# Patient Record
Sex: Male | Born: 1935 | Race: White | Hispanic: No | Marital: Married | Smoking: Current every day smoker
Health system: Southern US, Community
[De-identification: ages and names within clinical notes are randomized; demographics above are authoritative.]

## PROBLEM LIST (undated history)

## (undated) ENCOUNTER — Inpatient Hospital Stay: Admission: EM | Payer: Self-pay | Source: Home / Self Care

## (undated) DIAGNOSIS — C459 Mesothelioma, unspecified: Secondary | ICD-10-CM

## (undated) DIAGNOSIS — F419 Anxiety disorder, unspecified: Secondary | ICD-10-CM

## (undated) DIAGNOSIS — M7021 Olecranon bursitis, right elbow: Secondary | ICD-10-CM

## (undated) DIAGNOSIS — J189 Pneumonia, unspecified organism: Secondary | ICD-10-CM

## (undated) DIAGNOSIS — I82409 Acute embolism and thrombosis of unspecified deep veins of unspecified lower extremity: Secondary | ICD-10-CM

## (undated) DIAGNOSIS — I714 Abdominal aortic aneurysm, without rupture, unspecified: Secondary | ICD-10-CM

## (undated) DIAGNOSIS — F329 Major depressive disorder, single episode, unspecified: Secondary | ICD-10-CM

## (undated) DIAGNOSIS — N400 Enlarged prostate without lower urinary tract symptoms: Secondary | ICD-10-CM

## (undated) DIAGNOSIS — R238 Other skin changes: Secondary | ICD-10-CM

## (undated) DIAGNOSIS — J069 Acute upper respiratory infection, unspecified: Secondary | ICD-10-CM

## (undated) DIAGNOSIS — I10 Essential (primary) hypertension: Secondary | ICD-10-CM

## (undated) DIAGNOSIS — I4891 Unspecified atrial fibrillation: Secondary | ICD-10-CM

## (undated) DIAGNOSIS — K219 Gastro-esophageal reflux disease without esophagitis: Secondary | ICD-10-CM

## (undated) DIAGNOSIS — F32A Depression, unspecified: Secondary | ICD-10-CM

## (undated) DIAGNOSIS — R233 Spontaneous ecchymoses: Secondary | ICD-10-CM

## (undated) DIAGNOSIS — R040 Epistaxis: Secondary | ICD-10-CM

## (undated) DIAGNOSIS — J449 Chronic obstructive pulmonary disease, unspecified: Secondary | ICD-10-CM

## (undated) DIAGNOSIS — Z8719 Personal history of other diseases of the digestive system: Secondary | ICD-10-CM

## (undated) DIAGNOSIS — M1990 Unspecified osteoarthritis, unspecified site: Secondary | ICD-10-CM

## (undated) DIAGNOSIS — G4730 Sleep apnea, unspecified: Secondary | ICD-10-CM

## (undated) DIAGNOSIS — I499 Cardiac arrhythmia, unspecified: Secondary | ICD-10-CM

## (undated) HISTORY — DX: Gastro-esophageal reflux disease without esophagitis: K21.9

## (undated) HISTORY — DX: Abdominal aortic aneurysm, without rupture: I71.4

## (undated) HISTORY — DX: Unspecified atrial fibrillation: I48.91

## (undated) HISTORY — DX: Depression, unspecified: F32.A

## (undated) HISTORY — DX: Abdominal aortic aneurysm, without rupture, unspecified: I71.40

## (undated) HISTORY — DX: Olecranon bursitis, right elbow: M70.21

## (undated) HISTORY — DX: Major depressive disorder, single episode, unspecified: F32.9

## (undated) HISTORY — DX: Unspecified osteoarthritis, unspecified site: M19.90

## (undated) HISTORY — DX: Epistaxis: R04.0

## (undated) HISTORY — PX: HERNIA REPAIR: SHX51

## (undated) HISTORY — DX: Mesothelioma, unspecified: C45.9

---
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
## 2009-02-06 ENCOUNTER — Emergency Department (HOSPITAL_COMMUNITY): Admission: EM | Admit: 2009-02-06 | Discharge: 2009-02-06 | Payer: Self-pay | Admitting: Emergency Medicine

## 2010-03-28 LAB — POCT I-STAT, CHEM 8
BUN: 14 mg/dL (ref 6–23)
Calcium, Ion: 1.1 mmol/L — ABNORMAL LOW (ref 1.12–1.32)
Glucose, Bld: 101 mg/dL — ABNORMAL HIGH (ref 70–99)
Hemoglobin: 13.6 g/dL (ref 13.0–17.0)
TCO2: 24 mmol/L (ref 0–100)

## 2010-03-28 LAB — URINE MICROSCOPIC-ADD ON

## 2010-03-28 LAB — URINALYSIS, ROUTINE W REFLEX MICROSCOPIC
Bilirubin Urine: NEGATIVE
Nitrite: POSITIVE — AB
Protein, ur: NEGATIVE mg/dL
Specific Gravity, Urine: 1.021 (ref 1.005–1.030)
pH: 5.5 (ref 5.0–8.0)

## 2010-03-28 LAB — URINE CULTURE

## 2010-07-30 ENCOUNTER — Other Ambulatory Visit: Payer: Self-pay

## 2010-07-30 ENCOUNTER — Emergency Department (INDEPENDENT_AMBULATORY_CARE_PROVIDER_SITE_OTHER): Payer: Medicare PPO

## 2010-07-30 ENCOUNTER — Encounter: Payer: Self-pay | Admitting: *Deleted

## 2010-07-30 ENCOUNTER — Emergency Department (HOSPITAL_BASED_OUTPATIENT_CLINIC_OR_DEPARTMENT_OTHER)
Admission: EM | Admit: 2010-07-30 | Discharge: 2010-07-31 | Disposition: A | Discharge status: Other Acute Inpatient Hospital | Payer: Medicare PPO | Source: Home / Self Care | Attending: Emergency Medicine | Admitting: Emergency Medicine

## 2010-07-30 DIAGNOSIS — R0602 Shortness of breath: Secondary | ICD-10-CM

## 2010-07-30 DIAGNOSIS — I517 Cardiomegaly: Secondary | ICD-10-CM

## 2010-07-30 DIAGNOSIS — IMO0001 Reserved for inherently not codable concepts without codable children: Secondary | ICD-10-CM

## 2010-07-30 DIAGNOSIS — J90 Pleural effusion, not elsewhere classified: Secondary | ICD-10-CM

## 2010-07-30 HISTORY — DX: Benign prostatic hyperplasia without lower urinary tract symptoms: N40.0

## 2010-07-30 HISTORY — DX: Essential (primary) hypertension: I10

## 2010-07-30 LAB — CBC
HCT: 42.5 % (ref 39.0–52.0)
MCH: 31.7 pg (ref 26.0–34.0)
MCHC: 34.4 g/dL (ref 30.0–36.0)
Platelets: 132 10*3/uL — ABNORMAL LOW (ref 150–400)
RBC: 4.6 MIL/uL (ref 4.22–5.81)
RDW: 13.8 % (ref 11.5–15.5)

## 2010-07-30 LAB — BASIC METABOLIC PANEL
BUN: 24 mg/dL — ABNORMAL HIGH (ref 6–23)
CO2: 22 mEq/L (ref 19–32)
Creatinine, Ser: 1.1 mg/dL (ref 0.50–1.35)
Glucose, Bld: 181 mg/dL — ABNORMAL HIGH (ref 70–99)
Sodium: 137 mEq/L (ref 135–145)

## 2010-07-30 LAB — POCT I-STAT 3, ART BLOOD GAS (G3+)
Acid-base deficit: 2 mmol/L (ref 0.0–2.0)
O2 Saturation: 96 %
Patient temperature: 37
TCO2: 23 mmol/L (ref 0–100)
pCO2 arterial: 33.3 mmHg — ABNORMAL LOW (ref 35.0–45.0)

## 2010-07-30 LAB — DIFFERENTIAL
Basophils Relative: 0 % (ref 0–1)
Eosinophils Absolute: 0.2 10*3/uL (ref 0.0–0.7)
Lymphs Abs: 1.1 10*3/uL (ref 0.7–4.0)
Monocytes Relative: 6 % (ref 3–12)

## 2010-07-30 LAB — PROTIME-INR
INR: 0.98 (ref 0.00–1.49)
Prothrombin Time: 13.2 seconds (ref 11.6–15.2)

## 2010-07-30 LAB — PRO B NATRIURETIC PEPTIDE: Pro B Natriuretic peptide (BNP): 311.4 pg/mL (ref 0–450)

## 2010-07-30 LAB — APTT: aPTT: 31 seconds (ref 24–37)

## 2010-07-30 LAB — CARDIAC PANEL(CRET KIN+CKTOT+MB+TROPI): Relative Index: INVALID (ref 0.0–2.5)

## 2010-07-30 MED ORDER — ALBUTEROL SULFATE (5 MG/ML) 0.5% IN NEBU
5.0000 mg | INHALATION_SOLUTION | Freq: Once | RESPIRATORY_TRACT | Status: AC
  Administered 2010-07-30: 5 mg via RESPIRATORY_TRACT
  Filled 2010-07-30: qty 1

## 2010-07-30 NOTE — ED Provider Notes (Signed)
History     Chief Complaint  Patient presents with  . Shortness of Breath   HPI Comments: Patient presents with acute onset of shortness of breath which occurred approximately 4:30 this afternoon. He states that he had been outside working and came in to the house at which time he developed acute onset shortness of breath. This has been constant, moderate, worse with exertion. He has had no chest pain or back pain and has had no swelling of his legs today. He does however describe swelling of his legs that occurred last week. He was seen recently at his family care doctor where he was evaluated as a recheck and found to have hypertension and was started on Benicar. He denies any coughing, fevers, rashes, abdominal pain, blurry vision, dizziness, headache. She admits to smoking tobacco his wife but has never been diagnosed with COPD or other lung disease.  He noticed it is difficulty breathing resolved with oxygen by nasal cannula on arrival. However his initial oxygen saturations were 90% on room air.  Patient is a 77 y.o. male presenting with shortness of breath. The history is provided by the patient and the spouse.  Shortness of Breath  Associated symptoms include shortness of breath.    Past Medical History  Diagnosis Date  . Hypertension   . Prostate enlargement     Past Surgical History  Procedure Date  . Knee surgery     History reviewed. No pertinent family history.  History  Substance Use Topics  . Smoking status: Not on file  . Smokeless tobacco: Current User  . Alcohol Use: No      Review of Systems  Respiratory: Positive for shortness of breath.   All other systems reviewed and are negative.    Physical Exam  BP 182/72  Pulse 67  Temp(Src) 97.7 F (36.5 C) (Oral)  SpO2 96%  Physical Exam  Nursing note and vitals reviewed. Constitutional: He appears well-developed and well-nourished. No distress.  HENT:  Head: Normocephalic and atraumatic.    Mouth/Throat: Oropharynx is clear and moist. No oropharyngeal exudate.  Eyes: Conjunctivae and EOM are normal. Pupils are equal, round, and reactive to light. Right eye exhibits no discharge. Left eye exhibits no discharge. No scleral icterus.  Neck: Normal range of motion. Neck supple. No JVD present. No thyromegaly present.  Cardiovascular: Normal rate, regular rhythm, normal heart sounds and intact distal pulses.  Exam reveals no gallop and no friction rub.   No murmur heard. Pulmonary/Chest: Effort normal and breath sounds normal. No respiratory distress. He has no wheezes. He has no rales.       No focal findings in the lungs. No distress. No accessory muscle use. Oxygen noted to be 90% on room air, 97% on 2 L nasal cannula.  Abdominal: Soft. Bowel sounds are normal. He exhibits no distension and no mass. There is no tenderness.  Musculoskeletal: Normal range of motion. He exhibits no edema and no tenderness.  Lymphadenopathy:    He has no cervical adenopathy.  Neurological: He is alert. Coordination normal.  Skin: Skin is warm and dry. No rash noted. He is not diaphoretic. No erythema.  Psychiatric: He has a normal mood and affect. His behavior is normal.    ED Course  Procedures  MDM Patient has a hypoxia for an unknown reason. We'll evaluate with EKG, laboratory workup, chest x-ray. Nasal cannula given with improvement of oxygen status. ABG pending  ED ECG REPORT   Date: 07/30/2010   Rate: 67  Rhythm: normal sinus rhythm  QRS Axis: left  Intervals: PR prolonged  ST/T Wave abnormalities: normal  Conduction Disutrbances:first-degree A-V block  and nonspecific intraventricular conduction delay  Narrative Interpretation: LVH with repolarization abnormality, no acute ST changes  Old EKG Reviewed: none available   Results for orders placed during the hospital encounter of 07/30/10  CBC      Component Value Range   WBC 8.0  4.0 - 10.5 (K/uL)   RBC 4.60  4.22 - 5.81 (MIL/uL)    Hemoglobin 14.6  13.0 - 17.0 (g/dL)   HCT 16.1  09.6 - 04.5 (%)   MCV 92.4  78.0 - 100.0 (fL)   MCH 31.7  26.0 - 34.0 (pg)   MCHC 34.4  30.0 - 36.0 (g/dL)   RDW 40.9  81.1 - 91.4 (%)   Platelets 132 (*) 150 - 400 (K/uL)  DIFFERENTIAL      Component Value Range   Neutrophils Relative 79 (*) 43 - 77 (%)   Neutro Abs 6.3  1.7 - 7.7 (K/uL)   Lymphocytes Relative 13  12 - 46 (%)   Lymphs Abs 1.1  0.7 - 4.0 (K/uL)   Monocytes Relative 6  3 - 12 (%)   Monocytes Absolute 0.5  0.1 - 1.0 (K/uL)   Eosinophils Relative 2  0 - 5 (%)   Eosinophils Absolute 0.2  0.0 - 0.7 (K/uL)   Basophils Relative 0  0 - 1 (%)   Basophils Absolute 0.0  0.0 - 0.1 (K/uL)  BASIC METABOLIC PANEL      Component Value Range   Sodium 137  135 - 145 (mEq/L)   Potassium 3.7  3.5 - 5.1 (mEq/L)   Chloride 100  96 - 112 (mEq/L)   CO2 22  19 - 32 (mEq/L)   Glucose, Bld 181 (*) 70 - 99 (mg/dL)   BUN 24 (*) 6 - 23 (mg/dL)   Creatinine, Ser 7.82  0.50 - 1.35 (mg/dL)   Calcium 9.7  8.4 - 95.6 (mg/dL)   GFR calc non Af Amer >60  >60 (mL/min)   GFR calc Af Amer >60  >60 (mL/min)  APTT      Component Value Range   aPTT 31  24 - 37 (seconds)  PROTIME-INR      Component Value Range   Prothrombin Time 13.2  11.6 - 15.2 (seconds)   INR 0.98  0.00 - 1.49   PRO B NATRIURETIC PEPTIDE      Component Value Range   BNP, POC 311.4  0 - 450 (pg/mL)  CARDIAC PANEL(CRET KIN+CKTOT+MB+TROPI)      Component Value Range   Total CK 43  7 - 232 (U/L)   CK, MB 2.2  0.3 - 4.0 (ng/mL)   Troponin I <0.30  <0.30 (ng/mL)   Relative Index RELATIVE INDEX IS INVALID  0.0 - 2.5   POCT I-STAT 3, BLOOD GAS (G3+)      Component Value Range   pH, Arterial 7.423  7.350 - 7.450    pCO2 33.3 (*) 35.0 - 45.0 (mmHg)   pO2, Arterial 82.0  80.0 - 100.0 (mmHg)   Bicarbonate 21.8  20.0 - 24.0 (mEq/L)   TCO2 23  0 - 100 (mmol/L)   O2 Saturation 96.0     Acid-base deficit 2.0  0.0 - 2.0 (mmol/L)   Patient temperature 37.0 C     Collection site  RADIAL, ALLEN'S TEST ACCEPTABLE     Drawn by RT     Sample type ARTERIAL  Dg Chest 2 View  07/30/2010  *RADIOLOGY REPORT*  Clinical Data: Short of breath  CHEST - 2 VIEW  Comparison: Chest radiograph 02/06/2009  Findings: Normal mediastinum and cardiac silhouette with ectatic aorta.  The small bilateral pleural effusions are similar to prior. There is mild interstitial prominence which is similar to prior. No focal consolidation.  IMPRESSION:   Small bilateral pleural effusions and likely mild interstitial edema.  No significant change from prior.  Original Report Authenticated By: Genevive Bi, M.D.   I have reevaluated pt and has slight wheezing, sat's 96% on ra on my exam at this time.  He has no sx during his stay in the ED.  Neb ordered. Labs and imaging reviewed.  I have ambulated patient on room air and his oxygen saturation has dropped to close to 90%. He feels dyspneic at rest and despite getting albuterol has not had significant improvement in his subjective shortness of breath. ABG sample showed hypoxia with an O2 of 82 on 3 L nasal cannula. Will order CT scan to rule out PE prior to disposition.  I have reviewed CT scan.  Paged [facility] for admission for hypoxia and dyspnea. 11:51 PM  Discussed with [facility] hospitalist who has accepted in transfer.  Vida Roller, MD 07/31/10 [id number]

## 2010-07-30 NOTE — ED Notes (Signed)
Per patient & wife, started having sob around 16:30, took two tramadol this morning for R hip pain that shots down R leg

## 2010-07-30 NOTE — Procedures (Signed)
Steve [name] performed arterial puncture for collection of an ABG. Steve [name] RRT not able to sign off on procedure due to not added into Epic system. + Allens test/ pt tolerated well

## 2010-07-31 ENCOUNTER — Encounter (HOSPITAL_BASED_OUTPATIENT_CLINIC_OR_DEPARTMENT_OTHER): Payer: Self-pay | Admitting: *Deleted

## 2010-07-31 ENCOUNTER — Inpatient Hospital Stay (HOSPITAL_COMMUNITY)
Admission: AD | Admit: 2010-07-31 | Discharge: 2010-08-02 | DRG: 192 | Disposition: A | Discharge status: Home / Self Care | Payer: Medicare PPO | Source: Other Acute Inpatient Hospital | Attending: Internal Medicine | Admitting: Internal Medicine

## 2010-07-31 DIAGNOSIS — R0602 Shortness of breath: Secondary | ICD-10-CM

## 2010-07-31 DIAGNOSIS — I10 Essential (primary) hypertension: Secondary | ICD-10-CM | POA: Diagnosis present

## 2010-07-31 DIAGNOSIS — F17200 Nicotine dependence, unspecified, uncomplicated: Secondary | ICD-10-CM | POA: Diagnosis present

## 2010-07-31 DIAGNOSIS — Z79899 Other long term (current) drug therapy: Secondary | ICD-10-CM

## 2010-07-31 DIAGNOSIS — J209 Acute bronchitis, unspecified: Principal | ICD-10-CM | POA: Diagnosis present

## 2010-07-31 DIAGNOSIS — D72829 Elevated white blood cell count, unspecified: Secondary | ICD-10-CM | POA: Diagnosis present

## 2010-07-31 DIAGNOSIS — T380X5A Adverse effect of glucocorticoids and synthetic analogues, initial encounter: Secondary | ICD-10-CM | POA: Diagnosis present

## 2010-07-31 DIAGNOSIS — N400 Enlarged prostate without lower urinary tract symptoms: Secondary | ICD-10-CM | POA: Diagnosis present

## 2010-07-31 DIAGNOSIS — R0902 Hypoxemia: Secondary | ICD-10-CM | POA: Diagnosis present

## 2010-07-31 DIAGNOSIS — J440 Chronic obstructive pulmonary disease with acute lower respiratory infection: Principal | ICD-10-CM | POA: Diagnosis present

## 2010-07-31 DIAGNOSIS — J90 Pleural effusion, not elsewhere classified: Secondary | ICD-10-CM

## 2010-07-31 DIAGNOSIS — J984 Other disorders of lung: Secondary | ICD-10-CM

## 2010-07-31 DIAGNOSIS — J438 Other emphysema: Secondary | ICD-10-CM

## 2010-07-31 DIAGNOSIS — IMO0002 Reserved for concepts with insufficient information to code with codable children: Secondary | ICD-10-CM

## 2010-07-31 LAB — CK TOTAL AND CKMB (NOT AT ARMC)
CK, MB: 3.3 ng/mL (ref 0.3–4.0)
Relative Index: INVALID (ref 0.0–2.5)
Total CK: 77 U/L (ref 7–232)
Total CK: 100 U/L (ref 7–232)

## 2010-07-31 LAB — BASIC METABOLIC PANEL
GFR calc Af Amer: >60 mL/min (ref >60)
GFR calc non Af Amer: >60 mL/min (ref >60)
Glucose, Bld: 195 mg/dL — ABNORMAL HIGH (ref 70–99)
Potassium: 3.9 mEq/L (ref 3.5–5.1)
Sodium: 136 mEq/L (ref 135–145)

## 2010-07-31 MED ORDER — METHYLPREDNISOLONE SODIUM SUCC 125 MG IJ SOLR
125.0000 mg | Freq: Once | INTRAMUSCULAR | Status: AC
  Administered 2010-07-31: 125 mg via INTRAVENOUS
  Filled 2010-07-31: qty 2

## 2010-07-31 MED ORDER — IOHEXOL 350 MG/ML SOLN
100.0000 mL | Freq: Once | INTRAVENOUS | Status: AC | PRN
  Administered 2010-07-31: 100 mL via INTRAVENOUS

## 2010-07-31 MED ORDER — ONDANSETRON HCL 4 MG/2ML IJ SOLN
INTRAMUSCULAR | Status: AC
  Administered 2010-07-31: 4 mg via INTRAVENOUS
  Filled 2010-07-31: qty 2

## 2010-07-31 NOTE — ED Notes (Signed)
[id number] called in order to give nurse report, was told that nurse was off the fllor and she would call back for report.

## 2010-08-01 ENCOUNTER — Inpatient Hospital Stay (HOSPITAL_COMMUNITY): Payer: Medicare PPO

## 2010-08-01 DIAGNOSIS — R0602 Shortness of breath: Secondary | ICD-10-CM

## 2010-08-01 LAB — CBC
HCT: 40.5 % (ref 39.0–52.0)
Hemoglobin: 14 g/dL (ref 13.0–17.0)
MCV: 92.9 fL (ref 78.0–100.0)
RBC: 4.36 MIL/uL (ref 4.22–5.81)
WBC: 16.3 10*3/uL — ABNORMAL HIGH (ref 4.0–10.5)

## 2010-08-01 LAB — COMPREHENSIVE METABOLIC PANEL
ALT: 13 U/L (ref 0–53)
Alkaline Phosphatase: 82 U/L (ref 39–117)
CO2: 25 mEq/L (ref 19–32)
Chloride: 102 mEq/L (ref 96–112)
GFR calc Af Amer: >60 mL/min (ref >60)
GFR calc non Af Amer: >60 mL/min (ref >60)
Glucose, Bld: 142 mg/dL — ABNORMAL HIGH (ref 70–99)
Potassium: 4.3 mEq/L (ref 3.5–5.1)
Sodium: 138 mEq/L (ref 135–145)

## 2010-08-03 ENCOUNTER — Inpatient Hospital Stay (HOSPITAL_COMMUNITY)
Admission: EM | Admit: 2010-08-03 | Discharge: 2010-08-06 | Disposition: A | Discharge status: Home / Self Care | Payer: Medicare PPO | Source: Ambulatory Visit

## 2010-08-03 ENCOUNTER — Emergency Department (HOSPITAL_COMMUNITY): Payer: Medicare PPO

## 2010-08-03 LAB — CBC
HCT: 42.9 % (ref 39.0–52.0)
MCH: 33 pg (ref 26.0–34.0)
MCV: 91.3 fL (ref 78.0–100.0)
Platelets: 152 10*3/uL (ref 150–400)
RBC: 4.7 MIL/uL (ref 4.22–5.81)
RDW: 13.4 % (ref 11.5–15.5)

## 2010-08-03 LAB — TROPONIN I: Troponin I: <0.3 ng/mL (ref <0.30)

## 2010-08-04 ENCOUNTER — Encounter: Payer: Self-pay | Admitting: Internal Medicine

## 2010-08-04 LAB — BASIC METABOLIC PANEL
BUN: 27 mg/dL — ABNORMAL HIGH (ref 6–23)
Chloride: 103 mEq/L (ref 96–112)
Creatinine, Ser: 1.01 mg/dL (ref 0.50–1.35)
GFR calc Af Amer: >60 mL/min (ref >60)
Glucose, Bld: 109 mg/dL — ABNORMAL HIGH (ref 70–99)

## 2010-08-04 LAB — CARDIAC PANEL(CRET KIN+CKTOT+MB+TROPI)
CK, MB: 1.6 ng/mL (ref 0.3–4.0)
Relative Index: INVALID (ref 0.0–2.5)
Troponin I: <0.3 ng/mL (ref <0.30)
Troponin I: <0.3 ng/mL (ref <0.30)

## 2010-08-05 LAB — CBC
HCT: 45 % (ref 39.0–52.0)
Hemoglobin: 15.9 g/dL (ref 13.0–17.0)
MCH: 32.1 pg (ref 26.0–34.0)
MCHC: 35.3 g/dL (ref 30.0–36.0)
MCV: 90.7 fL (ref 78.0–100.0)
RDW: 13.4 % (ref 11.5–15.5)

## 2010-08-05 LAB — DIFFERENTIAL
Basophils Absolute: 0 10*3/uL (ref 0.0–0.1)
Eosinophils Relative: 0 % (ref 0–5)
Lymphocytes Relative: 9 % — ABNORMAL LOW (ref 12–46)
Lymphs Abs: 1 10*3/uL (ref 0.7–4.0)
Monocytes Absolute: 0.3 10*3/uL (ref 0.1–1.0)
Monocytes Relative: 3 % (ref 3–12)

## 2010-08-05 LAB — VITAMIN B12: Vitamin B-12: 369 pg/mL (ref 211–911)

## 2010-08-05 LAB — BASIC METABOLIC PANEL
BUN: 29 mg/dL — ABNORMAL HIGH (ref 6–23)
CO2: 23 mEq/L (ref 19–32)
Calcium: 9.4 mg/dL (ref 8.4–10.5)
Creatinine, Ser: 1 mg/dL (ref 0.50–1.35)
GFR calc non Af Amer: >60 mL/min (ref >60)
Glucose, Bld: 142 mg/dL — ABNORMAL HIGH (ref 70–99)

## 2010-08-05 LAB — HEMOGLOBIN A1C
Hgb A1c MFr Bld: 5.9 % — ABNORMAL HIGH (ref <5.7)
Mean Plasma Glucose: 123 mg/dL — ABNORMAL HIGH (ref <117)

## 2010-08-05 LAB — FERRITIN: Ferritin: 580 ng/mL — ABNORMAL HIGH (ref 22–322)

## 2010-08-06 LAB — CBC
HCT: 42.9 % (ref 39.0–52.0)
MCHC: 34.7 g/dL (ref 30.0–36.0)
Platelets: 177 10*3/uL (ref 150–400)
RDW: 13.6 % (ref 11.5–15.5)

## 2010-08-06 LAB — BASIC METABOLIC PANEL
BUN: 39 mg/dL — ABNORMAL HIGH (ref 6–23)
GFR calc Af Amer: >60 mL/min (ref >60)
GFR calc non Af Amer: >60 mL/min (ref >60)
Potassium: 4.1 mEq/L (ref 3.5–5.1)
Sodium: 136 mEq/L (ref 135–145)

## 2010-08-08 LAB — FOLATE RBC: RBC Folate: 449 ng/mL (ref >=366)

## 2010-08-10 NOTE — Discharge Summary (Signed)
NAMEMarland Kitchen  Steve [name], Steve [name] NO.:  [id number]  MEDICAL RECORD NO.:  [id number]  LOCATION:  4713                         FACILITY:  [facility]  PHYSICIAN:  Calvert Cantor, M.D.     DATE OF BIRTH:  1933/04/17  DATE OF ADMISSION:  07/31/2010 DATE OF DISCHARGE:                              DISCHARGE SUMMARY   PRIMARY CARE PHYSICIAN:  None.  The patient states that he is trying to find one at [facility].  PRESENTING COMPLAINT:  Dyspnea on exertion.  DISCHARGE DIAGNOSES: 1. Dyspnea on exertion, most likely acute chronic obstructive     pulmonary disease exacerbation plus acute bronchitis. 2. Hypoxia secondary to above. 3. Nicotine abuse. 4. Hypertension. 5. Benign prostatic hypertrophy.  DISCHARGE MEDICATIONS.:  NEW MEDICATIONS: 1. Albuterol inhaler 2 puffs q.4 hours as needed for dyspnea on     exertion and wheezing. 2. Moxifloxacin 400 mg daily. 3. Nicotine patch.  He will take 21 mg patch for a week followed by 14     mg patch for a week followed by a 7 mg patch for a week. 4. Prednisone 40 mg daily for 3 more days.  Continue the following home meds: 1. Benicar 20 mg daily. 2. Flomax 0.4 mg daily.  PROCEDURES:  Lower extremity venous Doppler was negative for DVT, superficial thrombosis and Baker cyst bilaterally.  Chest x-ray two-view on July 30, 2010, revealed small bilateral pleural effusions and likely mild interstitial edema.  No significant change from prior.  CT angio of the chest revealed no PE.  There was small right-sided pleural effusion with associated consolidation likely compressive atelectasis.  Emphysematous changes.  Mild left apical ground glass opacification, nonspecific.  A 9-mm nodule in the left lower lung measuring water attenuation may represent loculated fluid or a congenital cyst.  A 2-D echo performed on July 31, 2010, revealed mild concentric hypertrophy of the left ventricle, EF was 65-70%, grade 1 diastolic dysfunction was noted.   Although, no diagnostic regional wall motion abnormality was identified.  This possibility cannot be completely excluded on the basis of the study.  Left atrium was noted to be mildly dilated.  The ascending aorta was mildly dilated.  Pulmonary function test performed on day 2 of hospital stay.  Conclusion states that they were within normal limits, but also mentioned that there is a possibility of restriction.  PERTINENT BLOOD WORK:  Three sets of cardiac enzymes were negative.  BNP on admission was 314 which is within normal limits.  HOSPITAL COURSE:  This is a 77 year old male who smoked heavily throughout his life who was admitted with a complaint of dyspnea on exertion.  According to the H and P, it started quite suddenly when he was walking around the house.  The patient was admitted and had above workup done, despite normal PFTs I do suspect that this episode that he is having is related to emphysema.  He does have mild to moderate cough with mucus.  I have noted rhonchi and wheezing on his exam.  He was hypoxic on admission and required 3 liters of oxygen to maintain a saturation of 91%.  Following day, he continued to be hypoxic with O2 sats in the  mid 90s on 3 liters at rest, with minimal activity his sats were dropping to the low 80s.  Today, his oxygen levels are much better, O2 sat is 93% at rest and 91- 92 after exertion.  The patient states that his breathing has returned to baseline.  He is agreeable to discontinue smoking.  PHYSICAL EXAM:  LUNGS:  Mild rhonchi still present bilaterally.  No obvious wheezing.  He has a good inspiratory effort. HEART:  Regular rate and rhythm.  No murmurs. ABDOMEN:  Soft, nontender, nondistended.  Bowel sounds positive. EXTREMITIES:  No cyanosis, clubbing or edema.  CONDITION ON DISCHARGE:  Stable.  FOLLOWUP INSTRUCTIONS:  He is advised to find a PCP and follow up in 1-2 weeks.  End of discharge summary time on patient care was  55 minutes.     Calvert Cantor, M.D.     SR/MEDQ  D:  08/02/2010  T:  08/02/2010  Job:  [id number]  Electronically Signed by Calvert Cantor M.D. on 08/10/2010 07:26:17 AM

## 2010-08-11 ENCOUNTER — Encounter: Payer: Self-pay | Admitting: Internal Medicine

## 2010-08-11 ENCOUNTER — Ambulatory Visit (HOSPITAL_BASED_OUTPATIENT_CLINIC_OR_DEPARTMENT_OTHER)
Admission: RE | Admit: 2010-08-11 | Discharge: 2010-08-11 | Disposition: A | Discharge status: Home / Self Care | Payer: Medicare PPO | Source: Ambulatory Visit | Attending: Internal Medicine | Admitting: Internal Medicine

## 2010-08-11 ENCOUNTER — Ambulatory Visit (INDEPENDENT_AMBULATORY_CARE_PROVIDER_SITE_OTHER): Payer: Medicare PPO | Admitting: Internal Medicine

## 2010-08-11 DIAGNOSIS — M949 Disorder of cartilage, unspecified: Secondary | ICD-10-CM

## 2010-08-11 DIAGNOSIS — M5416 Radiculopathy, lumbar region: Secondary | ICD-10-CM

## 2010-08-11 DIAGNOSIS — M899 Disorder of bone, unspecified: Secondary | ICD-10-CM | POA: Insufficient documentation

## 2010-08-11 DIAGNOSIS — M545 Low back pain: Secondary | ICD-10-CM

## 2010-08-11 DIAGNOSIS — M549 Dorsalgia, unspecified: Secondary | ICD-10-CM

## 2010-08-11 DIAGNOSIS — M47817 Spondylosis without myelopathy or radiculopathy, lumbosacral region: Secondary | ICD-10-CM | POA: Insufficient documentation

## 2010-08-11 DIAGNOSIS — IMO0002 Reserved for concepts with insufficient information to code with codable children: Secondary | ICD-10-CM

## 2010-08-11 DIAGNOSIS — R42 Dizziness and giddiness: Secondary | ICD-10-CM

## 2010-08-11 DIAGNOSIS — F329 Major depressive disorder, single episode, unspecified: Secondary | ICD-10-CM

## 2010-08-11 DIAGNOSIS — J449 Chronic obstructive pulmonary disease, unspecified: Secondary | ICD-10-CM

## 2010-08-11 LAB — BASIC METABOLIC PANEL
BUN: 32 mg/dL — ABNORMAL HIGH (ref 6–23)
Potassium: 4.4 mEq/L (ref 3.5–5.3)

## 2010-08-11 MED ORDER — PREDNISONE 10 MG PO TABS: ORAL_TABLET | ORAL | Status: DC

## 2010-08-11 MED ORDER — CITALOPRAM HYDROBROMIDE 20 MG PO TABS: 20.0000 mg | ORAL_TABLET | Freq: Every day | ORAL | Status: DC

## 2010-08-11 NOTE — H&P (Signed)
NAMEMarland Kitchen  NAI, DASCH NO.:  [id number]  MEDICAL RECORD NO.:  [id number]  LOCATION:  4713                         FACILITY:  [facility]  PHYSICIAN:  Kela Millin, M.D.DATE OF BIRTH:  09-16-1933  DATE OF ADMISSION:  07/31/2010 DATE OF DISCHARGE:                             HISTORY & PHYSICAL   CHIEF COMPLAINT:  Dyspnea on exertion.  HISTORY OF PRESENT ILLNESS:  The patient is a 77 year old white male with past medical history significant for tobacco abuse, hypertension, and BPH who presents with above complaints.  He states that he was in his usual state of health until about 4:30 p.m. on the evening of admission when he developed shortness of breath as he was walking into his house.  He denies leg swelling, but states that he had been taking some "fluid pills."  He denies chest pain.  No orthopnea or PND reported.  He was seen at the [facility] ED and a chest x-ray was done which revealed small bilateral pleural effusions and likely mild interstitial edema, unchanged from his prior chest x-ray in 2011, a CT angiogram was done and came back negative for pulmonary embolism.  The patient denies cough, fevers, dysuria, diarrhea, or melena and no hematochezia.  He is admitted for further evaluation and management.  PAST MEDICAL HISTORY:  As above.  MEDICATIONS: 1. Benicar 5 mg p.o. daily. 2. Tramadol 50 mg p.o. daily. 3. Ibuprofen 200 mg. 4. Flomax 0.4 mg daily.  ALLERGIES:  NKDA.  SOCIAL HISTORY:  Positive for tobacco 2 packs per day for about 60 years.  He states that he quit alcohol 27 years ago.  FAMILY HISTORY:  Reviewed and noncontributory to current illness.  REVIEW OF SYSTEMS:  As per HPI, other review of systems negative.  PHYSICAL EXAMINATION:  GENERAL:  The patient is an elderly white male, in no apparent respiratory distress on nasal cannula oxygen. VITAL SIGNS:  His temperature is 97.9 with a pulse of 67, blood pressure 172/85, O2 sat of  91% on 3 liters nasal cannula (O2 sats were 89-90% on room air at the [facility] ED). HEENT:  PERRL, EOMI, sclerae anicteric, moist mucous membranes, and no oral exudates. NECK:  Supple, no adenopathy, no thyromegaly, and no JVD. LUNGS:  Expiratory crackles bilaterally. CARDIOVASCULAR:  Regular rate and rhythm.  Normal S1 and S2.  No S3 appreciated. ABDOMEN:  Soft, bowel sounds present, nontender, nondistended.  No organomegaly and no masses palpable. EXTREMITIES:  No cyanosis and no edema. NEURO:  He is alert and oriented x3.  Cranial nerves II-XII grossly intact.  Nonfocal exam.  LABORATORY DATA:  The chest x-ray is as per HPI.  CT angiogram of the chest shows no pulmonary embolism.  Small right-sided pleural effusion with associated consolidation, likely compressive atelectasis. Emphysematous changes.  Mild left apical ground-glass opacification nonspecific.  9-mm nodule within the left lower lung, may just water attenuation and may represent loculated fluid or congenital cyst.  ASSESSMENT AND PLAN: 1. Dyspnea on exertion - probably secondary to chronic obstructive     pulmonary disease, CT angiogram negative for PE.  We will obtain     pulmonary function tests, cardiac enzymes and follow.  His brain     natriuretic peptide is unremarkable but he states that he has been     taking "fluid pills," although it is not on his list of medications     available at this time - we will obtain a 2-D echocardiogram and     follow.  We will place the patient on nebulized bronchodilators,     steroids, and supplemental oxygen, we will hold off antibiotics for     now. 2. Hypertension - we will continue Benicar. 3. Benign prostatic hypertrophy - continue Flomax. 4. Tobacco abuse - the patient is counseled to quit tobacco.     Kela Millin, M.D.     ACV/MEDQ  D:  07/31/2010  T:  07/31/2010  Job:  [id number]  Electronically Signed by Donnalee Curry M.D. on 08/11/2010 09:10:32 PM

## 2010-08-12 NOTE — Discharge Summary (Signed)
  NAME:  Steve [name], Steve [name] NO.:  [id number]  MEDICAL RECORD NO.:  [id number]  LOCATION:                                 FACILITY:  PHYSICIAN:  Tarry Kos, MD       DATE OF BIRTH:  04-10-1933  DATE OF ADMISSION: DATE OF DISCHARGE:                              DISCHARGE SUMMARY   DISCHARGE DIAGNOSES: 1. Acute chronic obstructive pulmonary disease exacerbation. 2. Tobacco abuse.  SUMMARY OF HOSPITAL COURSE:  Steve [name] is a 77 year old male who was readmitted after being discharged on July 24 because of COPD exacerbation.  He came back because he was still short of breath.  He was previously on Avelox that was continued.  He was also previously on steroids that was also continued.  He was admitted.  We tried to qualify him for oxygen at home during this hospitalization.  However, with ambulation, his O2 sats were 98% yesterday.  I am going to ambulate him again in the hallways today to see if he de-sats at all.  He has been off oxygen since early this morning and his O2 sats have been normal. Again, we will try to qualify him for oxygen at home.  If not, he will be discharged home without oxygen to follow up with primary care physician in 1 week.  He is to continue his steroid taper and complete his course of Avelox as previously prescribed.  He has been afebrile.  PHYSICAL EXAMINATION:  VITAL SIGNS:  Have been stable. GENERAL:  He is alert and oriented x4, in no apparent distress, cooperative fairly. HEART:  Regular rate and rhythm without murmurs, rubs, or gallops. CHEST:  Clear to auscultation bilaterally.  No wheezing or rales. ABDOMEN:  Soft, nontender, nondistended.  Positive bowel sounds.  No hepatomegaly. EXTREMITIES:  No clubbing, cyanosis, or edema. PSYCHIATRIC:  Normal mood and affect. NEUROLOGIC:  No focal neurologic deficits.  His discharge medications are the same except we have instituted a low dose of diuretics for him for the next week.   This will need to be followed up with his primary care physician and decided if to continue this.  He is to follow up with primary care physician in 1 week.          ______________________________ Tarry Kos, MD     RD/MEDQ  D:  08/06/2010  T:  08/06/2010  Job:  [id number]  Electronically Signed by Tarry Kos MD on 08/12/2010 12:08:31 PM

## 2010-08-14 ENCOUNTER — Encounter: Payer: Self-pay | Admitting: Internal Medicine

## 2010-08-14 DIAGNOSIS — F329 Major depressive disorder, single episode, unspecified: Secondary | ICD-10-CM | POA: Insufficient documentation

## 2010-08-14 DIAGNOSIS — J449 Chronic obstructive pulmonary disease, unspecified: Secondary | ICD-10-CM | POA: Insufficient documentation

## 2010-08-14 DIAGNOSIS — R42 Dizziness and giddiness: Secondary | ICD-10-CM | POA: Insufficient documentation

## 2010-08-14 DIAGNOSIS — M5416 Radiculopathy, lumbar region: Secondary | ICD-10-CM | POA: Insufficient documentation

## 2010-08-14 NOTE — Progress Notes (Signed)
  Subjective:    Patient ID: Steve [name], male    DOB: 1933-10-22, 77 y.o.   MRN: [id number]  HPI Pt presents to clinic to establish care and for evaluation of multiple medical problems. Recent hospitalization reviewed for dyspnea thought to be possible copd related. Completed course of prednisone and dyspnea resolved. Underwent chest ct with no significant acute findings. LE [place] without clot and echo demonstrated nl ef. BNP also nl. States was discharged home on lasix but had to stop the medication due to weakness two days ago. Has noted decreased bp and dizziness worse on standing. Has also chronic h/o dizziness x one year and takes benicar and flomax. Is unclear if sx's began after either of these medications. Also c/o radiating pain from right buttock to right foot without paresthesia or leg weakness. No injury or trauma.States intermittent crying with depressed mood but without si/hi. No other complaints.  Reviewed pmh, medications, allergies, soc hx and fam hx.    Review of Systems see hpi     Objective:   Physical Exam  Nursing note and vitals reviewed. Constitutional: He appears well-developed and well-nourished. No distress.  HENT:  Head: Normocephalic and atraumatic.  Right Ear: External ear normal.  Left Ear: External ear normal.  Eyes: Conjunctivae are normal. Right eye exhibits no discharge. Left eye exhibits no discharge. No scleral icterus.  Neck: Neck supple. No JVD present. Carotid bruit is not present.  Cardiovascular: Normal rate, regular rhythm and normal heart sounds.  Exam reveals no gallop and no friction rub.   No murmur heard. Pulmonary/Chest: Effort normal and breath sounds normal. No respiratory distress. He has no wheezes. He has no rales.  Musculoskeletal:       No midline ls tenderness or bony abn. Le strength 5/5. Gait nl.  Neurological: He is alert.  Skin: Skin is warm and dry. He is not diaphoretic.  Psychiatric: He has a normal mood and affect.           Assessment & Plan:

## 2010-08-14 NOTE — Assessment & Plan Note (Signed)
Chronic with recent worsening. Suspect mild volume depletion. Dc lasix. Hold benicar. outpt bp log. Obtain chem7. If returns to baseline then consider stopping flomax for several days.

## 2010-08-14 NOTE — Assessment & Plan Note (Signed)
Prednisone taper. Obtain ls plain radiograph. Schedule followup.

## 2010-08-14 NOTE — Assessment & Plan Note (Signed)
Attempt celexa 20mg  po qd. Schedule close followup

## 2010-08-15 ENCOUNTER — Telehealth: Payer: Self-pay | Admitting: Internal Medicine

## 2010-08-15 NOTE — Telephone Encounter (Signed)
Patient's wife, Dewayne Hatch is requesting lab results from last week

## 2010-08-15 NOTE — Telephone Encounter (Signed)
Darlene called pt with no answer

## 2010-08-22 ENCOUNTER — Encounter: Payer: Self-pay | Admitting: Internal Medicine

## 2010-08-22 ENCOUNTER — Ambulatory Visit (INDEPENDENT_AMBULATORY_CARE_PROVIDER_SITE_OTHER): Payer: Medicare PPO | Admitting: Internal Medicine

## 2010-08-22 VITALS — BP 118/66 | HR 66 | Temp 97.7°F | Resp 18

## 2010-08-22 DIAGNOSIS — F419 Anxiety disorder, unspecified: Secondary | ICD-10-CM

## 2010-08-22 DIAGNOSIS — F411 Generalized anxiety disorder: Secondary | ICD-10-CM

## 2010-08-22 DIAGNOSIS — IMO0002 Reserved for concepts with insufficient information to code with codable children: Secondary | ICD-10-CM

## 2010-08-22 DIAGNOSIS — M5416 Radiculopathy, lumbar region: Secondary | ICD-10-CM

## 2010-08-22 MED ORDER — ALPRAZOLAM 0.25 MG PO TABS: 0.2500 mg | ORAL_TABLET | Freq: Three times a day (TID) | ORAL | Status: DC | PRN

## 2010-08-22 MED ORDER — PREGABALIN 50 MG PO CAPS: 50.0000 mg | ORAL_CAPSULE | Freq: Two times a day (BID) | ORAL | Status: DC

## 2010-08-22 MED ORDER — TRAMADOL HCL 50 MG PO TABS: 100.0000 mg | ORAL_TABLET | Freq: Once | ORAL | Status: DC

## 2010-08-24 ENCOUNTER — Ambulatory Visit (HOSPITAL_BASED_OUTPATIENT_CLINIC_OR_DEPARTMENT_OTHER)
Admission: RE | Admit: 2010-08-24 | Discharge: 2010-08-24 | Disposition: A | Discharge status: Home / Self Care | Payer: Medicare PPO | Source: Ambulatory Visit | Attending: Internal Medicine | Admitting: Internal Medicine

## 2010-08-24 DIAGNOSIS — IMO0002 Reserved for concepts with insufficient information to code with codable children: Secondary | ICD-10-CM

## 2010-08-24 DIAGNOSIS — I714 Abdominal aortic aneurysm, without rupture, unspecified: Secondary | ICD-10-CM | POA: Insufficient documentation

## 2010-08-24 DIAGNOSIS — M5144 Schmorl's nodes, thoracic region: Secondary | ICD-10-CM | POA: Insufficient documentation

## 2010-08-24 DIAGNOSIS — M5137 Other intervertebral disc degeneration, lumbosacral region: Secondary | ICD-10-CM

## 2010-08-24 DIAGNOSIS — M47817 Spondylosis without myelopathy or radiculopathy, lumbosacral region: Secondary | ICD-10-CM

## 2010-08-24 DIAGNOSIS — M5416 Radiculopathy, lumbar region: Secondary | ICD-10-CM

## 2010-08-24 DIAGNOSIS — M4120 Other idiopathic scoliosis, site unspecified: Secondary | ICD-10-CM | POA: Insufficient documentation

## 2010-08-24 DIAGNOSIS — M51379 Other intervertebral disc degeneration, lumbosacral region without mention of lumbar back pain or lower extremity pain: Secondary | ICD-10-CM | POA: Insufficient documentation

## 2010-08-26 ENCOUNTER — Telehealth: Payer: Self-pay | Admitting: Internal Medicine

## 2010-08-26 NOTE — Telephone Encounter (Signed)
pls let Steve [name] know i reviewed his back mri. He has mild disc bulging and changes but no herniation or obvious nerve pinch. We can talk about next steps when he comes in. 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 It is small and likely has been there for some time. Most do not need surgery but can be watched. i do need to talk with him about a better picture/scan of the area though. See if he'll come in sooner-like early next week so we can discuss.   Call placed to patient at [phone], patients wife Martha,stated patient is in the bed and continues to be in a lot of pain. [name] was advised per Dr. Rodena Medin instructions. She has scheduled a follow up for patient for Monday  08/29/2010 at 2:15p

## 2010-08-26 NOTE — Telephone Encounter (Signed)
Patient wife, Dewayne Hatch requesting MRI results.

## 2010-08-27 DIAGNOSIS — F419 Anxiety disorder, unspecified: Secondary | ICD-10-CM | POA: Insufficient documentation

## 2010-08-27 NOTE — Assessment & Plan Note (Signed)
Worsening despite prednisone. Schedule ls mri. Begin lyrica. Close followup scheduled.

## 2010-08-27 NOTE — Assessment & Plan Note (Signed)
Begin xanax prn short term and in sparing manner. Aware of potential for addiction and/or tolerance.

## 2010-08-27 NOTE — Progress Notes (Signed)
  Subjective:    Patient ID: Steve [name], male    DOB: 1933/02/10, 77 y.o.   MRN: [id number]  HPI Pt presents to clinic for followup of multiple medical problems. Continues with radicular leg pain and paresthesia of right leg. Describes a burning sensation and sensitivity to touch. +leg muscle cramps. No leg weakness. Sx's are worsening despite prednisone. Duration approximately 3 wks. No exacerbating or alleviating factors. LS xray shows degenerative changes. C/o generalized anxiety worsened with pain. No other external triggers or exacerbating factors. No depressive sx's. Not taking medication for this problem. No other complaints.  Past Medical History  Diagnosis Date  . Hypertension   . Prostate enlargement   . Blood stool     history of-no problems in past 5 years  . History of chicken pox     childhood  . GERD (gastroesophageal reflux disease)   . Arthritis     knee and back   Past Surgical History  Procedure Date  . Knee surgery     1957    reports that he has quit smoking. He has never used smokeless tobacco. He reports that he does not drink alcohol or use illicit drugs. family history includes Arthritis in his mother; Diabetes in his mother; Heart failure in his daughter and mother; Hypertension in his mother; and Other in his daughter. No Known Allergies   Review of Systems see hpi     Objective:   Physical Exam  Nursing note and vitals reviewed. Constitutional: He appears well-developed and well-nourished. No distress.  HENT:  Head: Normocephalic and atraumatic.  Eyes: Conjunctivae are normal. No scleral icterus.  Musculoskeletal:       Bilateral le strength 5/5. Gait nl.  Neurological: He is alert.  Skin: Skin is warm and dry. He is not diaphoretic.  Psychiatric: He has a normal mood and affect.          Assessment & Plan:

## 2010-08-29 ENCOUNTER — Ambulatory Visit (INDEPENDENT_AMBULATORY_CARE_PROVIDER_SITE_OTHER): Payer: Medicare PPO | Admitting: Internal Medicine

## 2010-08-29 ENCOUNTER — Encounter: Payer: Self-pay | Admitting: Internal Medicine

## 2010-08-29 DIAGNOSIS — M5416 Radiculopathy, lumbar region: Secondary | ICD-10-CM

## 2010-08-29 DIAGNOSIS — I719 Aortic aneurysm of unspecified site, without rupture: Secondary | ICD-10-CM | POA: Insufficient documentation

## 2010-08-29 DIAGNOSIS — IMO0002 Reserved for concepts with insufficient information to code with codable children: Secondary | ICD-10-CM

## 2010-08-29 NOTE — Assessment & Plan Note (Signed)
Schedule CT angiogram for further evaluation.

## 2010-08-29 NOTE — Progress Notes (Signed)
  Subjective:    Patient ID: Riki Altes, male    DOB: [birth year], 77 y.o.   MRN: [id number]  HPI Pt presents to clinic for followup of multiple medical problems. Continues to suffer from right leg pain and paresthesias suggestive of lumbar radiculopathy. No weakness. Failed to improve with prednisone. Taking ultram prn and notes recent improvement of pain over last 24 hours. Tolerating lyrica without significant side effect. Reviewed recent ls mri demonstrating spondylosis and disc degeneration without obvious nerve impingement. 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 Denies any recent procedures recently including catherization. Asx without abd pain. No other complaints. Total time of visit approximately 30 mins of which greater than 50% spent in counseling.   Past Medical History  Diagnosis Date  . Hypertension   . Prostate enlargement   . Blood stool     history of-no problems in past 5 years  . History of chicken pox     childhood  . GERD (gastroesophageal reflux disease)   . Arthritis     knee and back   Past Surgical History  Procedure Date  . Knee surgery     1957    reports that he has quit smoking. He has never used smokeless tobacco. He reports that he does not drink alcohol or use illicit drugs. family history includes Arthritis in his mother; Diabetes in his mother; Heart failure in his daughter and mother; Hypertension in his mother; and Other in his daughter. No Known Allergies   Review of Systems     Objective:   Physical Exam        Assessment & Plan:

## 2010-08-29 NOTE — Assessment & Plan Note (Signed)
MRI without correlation to clinical sx's. Continue ultram prn and lyrica. Neurosurgery consult.

## 2010-08-30 ENCOUNTER — Ambulatory Visit (HOSPITAL_BASED_OUTPATIENT_CLINIC_OR_DEPARTMENT_OTHER)
Admission: RE | Admit: 2010-08-30 | Discharge: 2010-08-30 | Disposition: A | Discharge status: Home / Self Care | Payer: Medicare PPO | Source: Ambulatory Visit | Attending: Internal Medicine | Admitting: Internal Medicine

## 2010-08-30 ENCOUNTER — Encounter (HOSPITAL_BASED_OUTPATIENT_CLINIC_OR_DEPARTMENT_OTHER): Payer: Self-pay

## 2010-08-30 DIAGNOSIS — I714 Abdominal aortic aneurysm, without rupture, unspecified: Secondary | ICD-10-CM | POA: Insufficient documentation

## 2010-08-30 DIAGNOSIS — R109 Unspecified abdominal pain: Secondary | ICD-10-CM | POA: Insufficient documentation

## 2010-08-30 DIAGNOSIS — I719 Aortic aneurysm of unspecified site, without rupture: Secondary | ICD-10-CM

## 2010-08-30 DIAGNOSIS — J90 Pleural effusion, not elsewhere classified: Secondary | ICD-10-CM | POA: Insufficient documentation

## 2010-08-30 DIAGNOSIS — R634 Abnormal weight loss: Secondary | ICD-10-CM

## 2010-08-30 MED ORDER — IOHEXOL 350 MG/ML SOLN
100.0000 mL | Freq: Once | INTRAVENOUS | Status: AC | PRN
  Administered 2010-08-30: 100 mL via INTRAVENOUS

## 2010-08-30 NOTE — H&P (Signed)
NAME:  Steve [name], Steve [name] NO.:  [id number]  MEDICAL RECORD NO.:  [id number]  LOCATION:  [facility]                         FACILITY:  [facility]  PHYSICIAN:  Richarda Overlie, MD       DATE OF BIRTH:  October 28, 1933  DATE OF ADMISSION:  08/03/2010 DATE OF DISCHARGE:                             HISTORY & PHYSICAL   PRIMARY CARE PHYSICIAN:  Dr. Ephriam Knuckles, [facility], [place]  CHIEF COMPLAINT:  Shortness of breath.  SUBJECTIVE:  This is a 77 year old male with a history of 2-pack per day history of smoking who presents to the ED with a chief complaint of shortness of breath.  The patient was recently admitted on July 22 and discharged on August 02, 2010 after being treated for COPD exacerbation with a component of grade 1 diastolic heart failure and his pulmonary function testing confirmed emphysema.  He was discharged on antibiotics and his prednisone taper and a nicotine patch.  The patient did have ambulatory oxygen evaluation done prior to discharge and his saturation dropped in the low 80s only briefly before being discharged to home. Prior to discharge, he was 91-92% oxygen exertion on room air.  The patient states that as soon as he got home he started feeling very short of breath and the shortness of breath was mostly with exertion but also present at rest.  He denied any fever, chills, rigors, or cough or any other new symptoms that have developed since yesterday.  PAST MEDICAL HISTORY: 1. History of COPD, nicotine dependence with 2-pack per day history of     smoking. 2. Hypertension. 3. BPH.  HOME MEDICATIONS: 1. Albuterol 2 puffs q.4 p.r.n. 2. Avelox 400 mg p.o. daily. 3. Nicotine patch with taper. 4. Prednisone. 5. Benicar 20 mg p.o. daily. 6. Flomax 0.4 mg p.o. daily.  ALLERGIES:  No known drug allergies.  REVIEW OF SYSTEMS:  Complete review of systems was done as documented in HPI.  SOCIAL HISTORY:  Positive for 2-pack per day history of  smoking. Alcohol, quit drinking 27 years ago.  FAMILY HISTORY:  Reviewed and found to be noncontributory to the current presentation.  PHYSICAL EXAMINATION:  VITAL SIGNS:  Blood pressure 128/73, temperature 98.2, and pulse 76. GENERAL:  Comfortable in no acute cardiopulmonary distress. HEENT:  Pupils equal and reactive.  Extraocular movements intact. NECK:  Supple.  No JVD. LUNGS:  Occasional rhonchi and bibasilar wheezing at the bases. CARDIOVASCULAR:  Regular rate and rhythm.  No murmurs, rubs, or gallops. ABDOMEN:  Soft, nontender, and nondistended. EXTREMITIES:  Without cyanosis, clubbing, or edema. NEUROLOGIC:  Cranial nerves II-XII grossly intact. PSYCHIATRIC:  Appropriate mood and affect.  LABORATORY DATA:  EKG shows normal sinus rhythm with a left bundle- branch block which is not new.  No labs were done today in the ED. Chest x-ray shows small right pleural effusion.  The patient also had a CT angio done at [facility] that did not show any pulmonary embolism but emphysematous changes and left apical ground-glass opacification.  ASSESSMENT: 1. Acute chronic obstructive pulmonary disease exacerbation/acute     bronchitis. 2. Hypoxia secondary to acute chronic obstructive pulmonary disease     exacerbation/acute bronchitis, 3.  Hypertension. 4. Benign prostatic hypertrophy.  PLAN:  The patient will be admitted because of her shortness of breath which is at this point multifactorial secondary to his COPD exacerbation secondary to acute bronchitis versus grade 1 diastolic heart failure which may be acute.  His most recent echo showed an EF of 65-70%.  I feel that the patient would benefit from some low dose Lasix on a daily basis given his small pleural effusions on the chest x-ray.  The patient has been ruled out for a DVT and a PE and therefore this study will not be repeated during this admission.  We will continue him on his prednisone as well as his nebulizer  and his antibiotic treatment.  The patient may be brought in for observation overnight and if he is feeling well then he may be discharged home in the morning.  He is a full code.     Richarda Overlie, MD     NA/MEDQ  D:  08/03/2010  T:  08/03/2010  Job:  [id number]  Electronically Signed by Richarda Overlie MD on 08/30/2010 10:26:38 PM

## 2010-08-31 ENCOUNTER — Telehealth: Payer: Self-pay | Admitting: Internal Medicine

## 2010-08-31 ENCOUNTER — Other Ambulatory Visit: Payer: Self-pay | Admitting: Internal Medicine

## 2010-08-31 DIAGNOSIS — I719 Aortic aneurysm of unspecified site, without rupture: Secondary | ICD-10-CM

## 2010-08-31 NOTE — Telephone Encounter (Signed)
CT scan shows the aneurysm that we talked about in clinic without any big surprises. Recommend vascular consult. Referral order placed  Call placed to patient at [phone], patient/wife advised per Dr Rodena Medin instructions.

## 2010-08-31 NOTE — Telephone Encounter (Signed)
Patient is requesting CT scan results

## 2010-09-01 ENCOUNTER — Encounter: Payer: Self-pay | Admitting: Vascular Surgery

## 2010-09-02 ENCOUNTER — Ambulatory Visit (INDEPENDENT_AMBULATORY_CARE_PROVIDER_SITE_OTHER): Payer: Medicare PPO | Admitting: Internal Medicine

## 2010-09-02 ENCOUNTER — Encounter: Payer: Self-pay | Admitting: Internal Medicine

## 2010-09-02 DIAGNOSIS — R21 Rash and other nonspecific skin eruption: Secondary | ICD-10-CM | POA: Insufficient documentation

## 2010-09-02 DIAGNOSIS — R079 Chest pain, unspecified: Secondary | ICD-10-CM | POA: Insufficient documentation

## 2010-09-02 MED ORDER — METHYLPREDNISOLONE ACETATE 40 MG/ML IJ SUSP
40.0000 mg | Freq: Once | INTRAMUSCULAR | Status: AC
  Administered 2010-09-02: 40 mg via INTRAMUSCULAR

## 2010-09-02 NOTE — Assessment & Plan Note (Signed)
Suspect drug etiology. Stop lyrica. Continue benadryl prn. Given depomedrol 40mg  im. Followup if no improvement or worsening.

## 2010-09-02 NOTE — Progress Notes (Signed)
  Subjective:    Patient ID: Steve [name], male    DOB: [birth year], 77 y.o.   MRN: [id number]  HPI Pt presents to clinic as a work in for evaluation of rash and chest pain. One day duration of diffuse erythematous pruritic rash involving body except face and mucosal membranes. No associated tongue swelling, difficulty swallowing or dyspnea. No obvious trigger other than recent medication addition of lyrica. Attempting benadryl prn. No other alleviating or exacerbating factors. Also several days ago had episode of "low blood pressure" ?sbp ~80 for a few minutes which resolved quickly. Subsequently had episode of left sided chest pain without radiation to neck or arm. Was not exertional and not associated with dyspnea, diaphoresis or nausea. No further episodes. Echo obtained during hospitalization 7/12 reviewed without WMA. No other complaints.  Past Medical History  Diagnosis Date  . Hypertension   . Prostate enlargement   . Blood stool     history of-no problems in past 5 years  . History of chicken pox     childhood  . GERD (gastroesophageal reflux disease)   . Arthritis     knee and back  . AAA (abdominal aortic aneurysm)    Past Surgical History  Procedure Date  . Knee surgery     1957    reports that he has quit smoking. He has never used smokeless tobacco. He reports that he does not drink alcohol or use illicit drugs. family history includes Arthritis in his mother; Diabetes in his mother; Heart failure in his daughter and mother; Hypertension in his mother; and Other in his daughter. No Known Allergies   Review of Systems see hpi     Objective:   Physical Exam  Physical Exam  Nursing note and vitals reviewed. Constitutional: Appears well-developed and well-nourished. No distress.  HENT:  Head: Normocephalic and atraumatic.  Right Ear: External ear normal.  Left Ear: External ear normal.  Eyes: Conjunctivae are normal. No scleral icterus.  Neck: Neck supple. Carotid  bruit is not present.  Cardiovascular: Normal rate, regular rhythm and normal heart sounds.  Exam reveals no gallop and no friction rub.   No murmur heard. Pulmonary/Chest: Effort normal and breath sounds normal. No respiratory distress. He has no wheezes. no rales.  Lymphadenopathy:    He has no cervical adenopathy.  Neurological:Alert.  Skin: Skin is warm and dry. Not diaphoretic. Diffuse erythematous rash involving arms, torso, legs. No oral involvement.  Psychiatric: Has a normal mood and affect.        Assessment & Plan:

## 2010-09-02 NOTE — Assessment & Plan Note (Signed)
Currently asx. EKG obtained with nsr 60 with  t wave changes. (flattening I, inversion avl). No acute evidence of ischemia. Recommend continue asa and schedule nuclear stress test. Cannot perform exercise component due to right leg pain/radicular pain. Schedule lexiscan. H/o copd but no recent wheezing/dyspnea.

## 2010-09-05 ENCOUNTER — Emergency Department (HOSPITAL_COMMUNITY)
Admission: EM | Admit: 2010-09-05 | Discharge: 2010-09-05 | Disposition: A | Discharge status: Home / Self Care | Payer: Medicare PPO | Source: Home / Self Care | Attending: Emergency Medicine | Admitting: Emergency Medicine

## 2010-09-05 DIAGNOSIS — I951 Orthostatic hypotension: Secondary | ICD-10-CM | POA: Insufficient documentation

## 2010-09-05 DIAGNOSIS — I10 Essential (primary) hypertension: Secondary | ICD-10-CM | POA: Insufficient documentation

## 2010-09-05 DIAGNOSIS — R42 Dizziness and giddiness: Secondary | ICD-10-CM | POA: Insufficient documentation

## 2010-09-05 LAB — URINALYSIS, ROUTINE W REFLEX MICROSCOPIC
Glucose, UA: NEGATIVE mg/dL
Leukocytes, UA: NEGATIVE
pH: 5.5 (ref 5.0–8.0)

## 2010-09-05 LAB — POCT I-STAT, CHEM 8
Creatinine, Ser: 1.5 mg/dL — ABNORMAL HIGH (ref 0.50–1.35)
HCT: 45 % (ref 39.0–52.0)
Hemoglobin: 15.3 g/dL (ref 13.0–17.0)
Potassium: 4.2 mEq/L (ref 3.5–5.1)
Sodium: 141 mEq/L (ref 135–145)

## 2010-09-05 LAB — CBC
Hemoglobin: 14.5 g/dL (ref 13.0–17.0)
RBC: 4.53 MIL/uL (ref 4.22–5.81)

## 2010-09-05 LAB — URINE MICROSCOPIC-ADD ON

## 2010-09-06 ENCOUNTER — Inpatient Hospital Stay (HOSPITAL_COMMUNITY)
Admission: EM | Admit: 2010-09-06 | Discharge: 2010-09-11 | DRG: 312 | Disposition: A | Discharge status: Home / Self Care | Payer: Medicare PPO | Attending: Internal Medicine | Admitting: Internal Medicine

## 2010-09-06 ENCOUNTER — Encounter: Payer: Self-pay | Admitting: Vascular Surgery

## 2010-09-06 ENCOUNTER — Emergency Department (HOSPITAL_COMMUNITY): Payer: Medicare PPO

## 2010-09-06 DIAGNOSIS — G8929 Other chronic pain: Secondary | ICD-10-CM | POA: Diagnosis present

## 2010-09-06 DIAGNOSIS — I10 Essential (primary) hypertension: Secondary | ICD-10-CM | POA: Diagnosis present

## 2010-09-06 DIAGNOSIS — F3289 Other specified depressive episodes: Secondary | ICD-10-CM | POA: Diagnosis present

## 2010-09-06 DIAGNOSIS — Z01812 Encounter for preprocedural laboratory examination: Secondary | ICD-10-CM

## 2010-09-06 DIAGNOSIS — E860 Dehydration: Secondary | ICD-10-CM | POA: Diagnosis present

## 2010-09-06 DIAGNOSIS — F329 Major depressive disorder, single episode, unspecified: Secondary | ICD-10-CM | POA: Diagnosis present

## 2010-09-06 DIAGNOSIS — F411 Generalized anxiety disorder: Secondary | ICD-10-CM | POA: Diagnosis present

## 2010-09-06 DIAGNOSIS — J309 Allergic rhinitis, unspecified: Secondary | ICD-10-CM | POA: Diagnosis present

## 2010-09-06 DIAGNOSIS — J449 Chronic obstructive pulmonary disease, unspecified: Secondary | ICD-10-CM | POA: Diagnosis present

## 2010-09-06 DIAGNOSIS — I951 Orthostatic hypotension: Principal | ICD-10-CM | POA: Diagnosis present

## 2010-09-06 DIAGNOSIS — Z7982 Long term (current) use of aspirin: Secondary | ICD-10-CM

## 2010-09-06 DIAGNOSIS — M549 Dorsalgia, unspecified: Secondary | ICD-10-CM | POA: Diagnosis present

## 2010-09-06 DIAGNOSIS — N179 Acute kidney failure, unspecified: Secondary | ICD-10-CM | POA: Diagnosis present

## 2010-09-06 DIAGNOSIS — N138 Other obstructive and reflux uropathy: Secondary | ICD-10-CM | POA: Diagnosis present

## 2010-09-06 DIAGNOSIS — N401 Enlarged prostate with lower urinary tract symptoms: Secondary | ICD-10-CM | POA: Diagnosis present

## 2010-09-06 DIAGNOSIS — F17200 Nicotine dependence, unspecified, uncomplicated: Secondary | ICD-10-CM | POA: Diagnosis present

## 2010-09-06 DIAGNOSIS — J4489 Other specified chronic obstructive pulmonary disease: Secondary | ICD-10-CM | POA: Diagnosis present

## 2010-09-06 DIAGNOSIS — E785 Hyperlipidemia, unspecified: Secondary | ICD-10-CM | POA: Diagnosis present

## 2010-09-06 LAB — POCT I-STAT 3, VENOUS BLOOD GAS (G3P V)
pCO2, Ven: 41.7 mmHg — ABNORMAL LOW (ref 45.0–50.0)
pH, Ven: 7.409 — ABNORMAL HIGH (ref 7.250–7.300)
pO2, Ven: 34 mmHg (ref 30.0–45.0)

## 2010-09-06 LAB — URINE CULTURE
Colony Count: NO GROWTH
Culture  Setup Time: 201208280223
Culture: NO GROWTH

## 2010-09-06 LAB — BASIC METABOLIC PANEL
CO2: 26 mEq/L (ref 19–32)
Calcium: 9.4 mg/dL (ref 8.4–10.5)
Chloride: 104 mEq/L (ref 96–112)
Glucose, Bld: 92 mg/dL (ref 70–99)
Sodium: 139 mEq/L (ref 135–145)

## 2010-09-06 LAB — URINALYSIS, ROUTINE W REFLEX MICROSCOPIC
Ketones, ur: NEGATIVE mg/dL
Leukocytes, UA: NEGATIVE
Nitrite: NEGATIVE
Protein, ur: NEGATIVE mg/dL

## 2010-09-06 LAB — URINE MICROSCOPIC-ADD ON

## 2010-09-06 LAB — CBC
Hemoglobin: 14.1 g/dL (ref 13.0–17.0)
MCH: 32.3 pg (ref 26.0–34.0)
MCV: 91.5 fL (ref 78.0–100.0)
Platelets: 165 10*3/uL (ref 150–400)
RBC: 4.37 MIL/uL (ref 4.22–5.81)
WBC: 8.5 10*3/uL (ref 4.0–10.5)

## 2010-09-06 LAB — PRO B NATRIURETIC PEPTIDE: Pro B Natriuretic peptide (BNP): 179.4 pg/mL (ref 0–450)

## 2010-09-06 LAB — DIFFERENTIAL
Lymphs Abs: 1.6 10*3/uL (ref 0.7–4.0)
Monocytes Relative: 9 % (ref 3–12)
Neutro Abs: 5.9 10*3/uL (ref 1.7–7.7)
Neutrophils Relative %: 70 % (ref 43–77)

## 2010-09-07 ENCOUNTER — Encounter: Payer: Medicare PPO | Admitting: Vascular Surgery

## 2010-09-07 LAB — TROPONIN I: Troponin I: <0.3 ng/mL (ref <0.30)

## 2010-09-07 LAB — CK TOTAL AND CKMB (NOT AT ARMC): Relative Index: INVALID (ref 0.0–2.5)

## 2010-09-07 LAB — CARDIAC PANEL(CRET KIN+CKTOT+MB+TROPI)
CK, MB: 2.1 ng/mL (ref 0.3–4.0)
Relative Index: INVALID (ref 0.0–2.5)
Total CK: 24 U/L (ref 7–232)
Troponin I: <0.3 ng/mL (ref <0.30)
Troponin I: <0.3 ng/mL (ref <0.30)

## 2010-09-07 LAB — COMPREHENSIVE METABOLIC PANEL
ALT: 27 U/L (ref 0–53)
AST: 20 U/L (ref 0–37)
Albumin: 2.7 g/dL — ABNORMAL LOW (ref 3.5–5.2)
Alkaline Phosphatase: 77 U/L (ref 39–117)
Calcium: 8.8 mg/dL (ref 8.4–10.5)
GFR calc Af Amer: >60 mL/min (ref >60)
Glucose, Bld: 87 mg/dL (ref 70–99)
Potassium: 3.8 mEq/L (ref 3.5–5.1)
Sodium: 143 mEq/L (ref 135–145)
Total Protein: 5.9 g/dL — ABNORMAL LOW (ref 6.0–8.3)

## 2010-09-07 LAB — CBC
HCT: 35.5 % — ABNORMAL LOW (ref 39.0–52.0)
Hemoglobin: 12 g/dL — ABNORMAL LOW (ref 13.0–17.0)
MCHC: 33.8 g/dL (ref 30.0–36.0)
RBC: 3.82 MIL/uL — ABNORMAL LOW (ref 4.22–5.81)
WBC: 7.9 10*3/uL (ref 4.0–10.5)

## 2010-09-07 LAB — LIPID PANEL
HDL: 29 mg/dL — ABNORMAL LOW (ref >39)
Total CHOL/HDL Ratio: 5.7 RATIO
VLDL: 21 mg/dL (ref 0–40)

## 2010-09-07 LAB — CORTISOL-AM, BLOOD: Cortisol - AM: 5 ug/dL (ref 4.3–22.4)

## 2010-09-07 LAB — TSH: TSH: 0.345 u[IU]/mL — ABNORMAL LOW (ref 0.350–4.500)

## 2010-09-07 LAB — PHOSPHORUS: Phosphorus: 3.3 mg/dL (ref 2.3–4.6)

## 2010-09-08 LAB — CBC
Hemoglobin: 11.9 g/dL — ABNORMAL LOW (ref 13.0–17.0)
MCV: 92.5 fL (ref 78.0–100.0)
Platelets: 152 10*3/uL (ref 150–400)
RBC: 3.74 MIL/uL — ABNORMAL LOW (ref 4.22–5.81)
WBC: 7.7 10*3/uL (ref 4.0–10.5)

## 2010-09-08 LAB — TSH: TSH: 0.611 u[IU]/mL (ref 0.350–4.500)

## 2010-09-08 LAB — BASIC METABOLIC PANEL
CO2: 24 mEq/L (ref 19–32)
Chloride: 106 mEq/L (ref 96–112)
Creatinine, Ser: 0.79 mg/dL (ref 0.50–1.35)
Glucose, Bld: 84 mg/dL (ref 70–99)
Sodium: 139 mEq/L (ref 135–145)

## 2010-09-08 LAB — T4, FREE: Free T4: 0.95 ng/dL (ref 0.80–1.80)

## 2010-09-09 ENCOUNTER — Inpatient Hospital Stay (HOSPITAL_COMMUNITY): Payer: Medicare PPO

## 2010-09-09 ENCOUNTER — Encounter: Payer: Self-pay | Admitting: *Deleted

## 2010-09-09 LAB — VITAMIN D 1,25 DIHYDROXY
Vitamin D2 1, 25 (OH)2: <8 pg/mL
Vitamin D3 1, 25 (OH)2: 62 pg/mL

## 2010-09-10 ENCOUNTER — Inpatient Hospital Stay (HOSPITAL_COMMUNITY): Payer: Medicare PPO

## 2010-09-10 LAB — RPR: RPR Ser Ql: NONREACTIVE

## 2010-09-10 LAB — BASIC METABOLIC PANEL
CO2: 26 mEq/L (ref 19–32)
Chloride: 102 mEq/L (ref 96–112)
Creatinine, Ser: 0.88 mg/dL (ref 0.50–1.35)
Potassium: 4.2 mEq/L (ref 3.5–5.1)

## 2010-09-11 LAB — BASIC METABOLIC PANEL
BUN: 22 mg/dL (ref 6–23)
CO2: 29 mEq/L (ref 19–32)
Chloride: 103 mEq/L (ref 96–112)
Creatinine, Ser: 0.92 mg/dL (ref 0.50–1.35)
Glucose, Bld: 88 mg/dL (ref 70–99)
Potassium: 4.2 mEq/L (ref 3.5–5.1)

## 2010-09-11 LAB — CBC
HCT: 38.3 % — ABNORMAL LOW (ref 39.0–52.0)
Hemoglobin: 12.9 g/dL — ABNORMAL LOW (ref 13.0–17.0)
MCV: 94.3 fL (ref 78.0–100.0)
RDW: 14 % (ref 11.5–15.5)
WBC: 10.4 10*3/uL (ref 4.0–10.5)

## 2010-09-13 ENCOUNTER — Ambulatory Visit: Payer: Medicare PPO | Admitting: Internal Medicine

## 2010-09-14 ENCOUNTER — Ambulatory Visit (HOSPITAL_COMMUNITY): Payer: Medicare PPO | Attending: Internal Medicine | Admitting: Radiology

## 2010-09-14 DIAGNOSIS — R0989 Other specified symptoms and signs involving the circulatory and respiratory systems: Secondary | ICD-10-CM

## 2010-09-14 DIAGNOSIS — I491 Atrial premature depolarization: Secondary | ICD-10-CM

## 2010-09-14 DIAGNOSIS — J449 Chronic obstructive pulmonary disease, unspecified: Secondary | ICD-10-CM | POA: Insufficient documentation

## 2010-09-14 DIAGNOSIS — J4489 Other specified chronic obstructive pulmonary disease: Secondary | ICD-10-CM | POA: Insufficient documentation

## 2010-09-14 DIAGNOSIS — R0789 Other chest pain: Secondary | ICD-10-CM

## 2010-09-14 DIAGNOSIS — R079 Chest pain, unspecified: Secondary | ICD-10-CM | POA: Insufficient documentation

## 2010-09-14 DIAGNOSIS — R9431 Abnormal electrocardiogram [ECG] [EKG]: Secondary | ICD-10-CM | POA: Insufficient documentation

## 2010-09-14 DIAGNOSIS — I4949 Other premature depolarization: Secondary | ICD-10-CM

## 2010-09-14 MED ORDER — SODIUM CHLORIDE 0.9 % IV SOLN
40.0000 ug/kg | INTRAVENOUS | Status: DC
  Administered 2010-09-14: 40 ug/kg/min via INTRAVENOUS

## 2010-09-14 MED ORDER — TECHNETIUM TC 99M TETROFOSMIN IV KIT
10.2000 | PACK | Freq: Once | INTRAVENOUS | Status: AC | PRN
  Administered 2010-09-14: 10 via INTRAVENOUS

## 2010-09-14 MED ORDER — TECHNETIUM TC 99M TETROFOSMIN IV KIT
32.7000 | PACK | Freq: Once | INTRAVENOUS | Status: AC | PRN
  Administered 2010-09-14: 32.7 via INTRAVENOUS

## 2010-09-14 NOTE — Progress Notes (Signed)
[facility] SITE 3 NUCLEAR MED [address] [place] [address] [phone]  Cardiology Nuclear Med Study  Steve [name] is a 77 y.o. male [id number] May 03, 1933   Nuclear Med Background Indication for Stress Test:  Evaluation for Ischemia and 09/11/10 Post Hospital: orthostatic hypotension secondary to autonomic dysfunction, dehydration,acute renal failure, (-) enzymes History:  No previous documented CAD, COPD, Emphysema, 07/01/10 Echo: EF=65-70%, mild LVH,CHF, and AAA 4.0 cm per CT 8/21/12Cardiac Risk Factors: History of Smoking (quit 3 mos. Ago), and Hypertension  Symptoms:  Chest Pain and Chest Tightness (last date of chest discomfort Couple weeks ago), Dizziness, DOE, Fatigue, Fatigue with Exertion, Light-Headedness, Nausea, Near Syncope, Palpitations, Rapid HR, SOB and Syncope   Nuclear Pre-Procedure Caffeine/Decaff Intake:  8:00am NPO After: 8:00am   Lungs:  Diminished breath sounds, no wheezing IV 0.9% NS with Angio Cath:  20g  IV Site: R Forearm  IV Started by:  Stanton Kidney, EMT-P  Chest Size (in):  44 Cup Size: n/a  Height: 5\' 8"  (1.727 m)  Weight:  201 lb (91.173 kg)  BMI:  Body mass index is 30.56 kg/(m^2). Tech Comments:  The patient had  Caffeine this am , changed to Dobutamine myoview    Nuclear Med Study 1 or 2 day study: 1 day  Stress Test Type:  Dobutamine  Reading MD: Marca Ancona, MD  Order Authorizing Provider:  Clifton Custard.  Resting Radionuclide: Technetium 27m Tetrofosmin  Resting Radionuclide Dose: 10.2 mCi   Stress Radionuclide:  Technetium 38m Tetrofosmin  Stress Radionuclide Dose: 32.7 mCi           Stress Protocol Rest HR: 61 Stress HR: 127  Rest BP: 119/67 Stress BP: 152/46  Exercise Time (min): n/a METS: n/a   Predicted Max HR: 145 bpm % Max HR: 87.59 bpm Rate Pressure Product: 21308   Dose of Adenosine (mg):  n/a Dose of Lexiscan: n/a mg  Dose of Atropine (mg): n/a Dose of Dobutamine: 40 mcg/kg/min (at max HR)    Stress Test Technologist: Irean Hong, RN  Nuclear Technologist:  Domenic Polite, CNMT     Rest Procedure:  Myocardial perfusion imaging was performed at rest 45 minutes following the intravenous administration of Technetium 71m Tetrofosmin. Rest ECG: NSR and ILBBB  Stress Procedure: The patient received IV dobutamine and no IV atropine.  There were no significant changes with infusion. There were frequent PVC's and PAC's.  Technetium 9m Tetrofosmin was injected at peak heart rate and quantitative spect images were obtained after a 45 minute delay. Stress ECG: No significant change from baseline ECG  QPS Raw Data Images:  Normal; no motion artifact; normal heart/lung ratio. Stress Images:  Basal inferior perfusion defect.  Rest Images:  Basal inferior perfusion defect.  Subtraction (SDS):  Fixed basal inferior perfusion defect.  Transient Ischemic Dilatation (Normal <1.22):  0.89 Lung/Heart Ratio (Normal <0.45):  0.22  Quantitative Gated Spect Images QGS EDV:  74 ml QGS ESV:  28 ml QGS cine images:  NL LV Function; NL Wall Motion QGS EF: 62%  Impression Exercise Capacity:  Dobutamine study with no exercise. BP Response:  Normal blood pressure response. Clinical Symptoms:  Short of breath, no chest pain.  ECG Impression:  IVCD, no change with infusion.  Comparison with Prior Nuclear Study: No previous nuclear study performed  Overall Impression:  Small fixed basal inferior perfusion defect with normal wall motion.  I suspect that this is diaphragmatic attenuation.  Normal wall motion.  No ischemia.  Low  risk study.   Gustin Zobrist [employer]

## 2010-09-16 ENCOUNTER — Encounter: Payer: Self-pay | Admitting: Internal Medicine

## 2010-09-16 ENCOUNTER — Ambulatory Visit (INDEPENDENT_AMBULATORY_CARE_PROVIDER_SITE_OTHER): Payer: Medicare PPO | Admitting: Internal Medicine

## 2010-09-16 VITALS — BP 124/64 | HR 66 | Temp 97.4°F | Resp 20 | Wt 198.0 lb

## 2010-09-16 DIAGNOSIS — N289 Disorder of kidney and ureter, unspecified: Secondary | ICD-10-CM

## 2010-09-16 DIAGNOSIS — J449 Chronic obstructive pulmonary disease, unspecified: Secondary | ICD-10-CM

## 2010-09-16 DIAGNOSIS — R079 Chest pain, unspecified: Secondary | ICD-10-CM

## 2010-09-16 LAB — BASIC METABOLIC PANEL
BUN: 21 mg/dL (ref 6–23)
CO2: 25 mEq/L (ref 19–32)
Calcium: 9.2 mg/dL (ref 8.4–10.5)
Chloride: 105 mEq/L (ref 96–112)
Creat: 1.16 mg/dL (ref 0.50–1.35)
Glucose, Bld: 125 mg/dL — ABNORMAL HIGH (ref 70–99)

## 2010-09-17 DIAGNOSIS — N289 Disorder of kidney and ureter, unspecified: Secondary | ICD-10-CM | POA: Insufficient documentation

## 2010-09-17 NOTE — Progress Notes (Signed)
  Subjective:    Patient ID: Steve [name], male    DOB: [birth year], 77 y.o.   MRN: [id number]  HPI Pt presents to clinic for followup of multiple medical problems. Recent hospitalization for volume depletion with elevated cr. Received ivf but developed urinary retention thought to be secondary to bph. Has foley catheter currently being followed by urology. Was mildly hypoxia during hospitalization thought to be secondary to copd and was discharged home with o2 which he uses prn. o2 sat 95% today. Uses albuterol mdi prn and has quit smoking several months ago. Reviewed recent dobutamine stress test obtained due to cp and felt to be a low risk study. Has continued right leg pain felt to represent lumbar radicuopathy however ls mri without obvious nerve impingement. Missed specialist appt while in the hospital but has rescheduled for early next week. Could not tolerate lyrica due to rash. Incidental infra renal aortic aneurysm noted on ls mri and confirmed by CTA. Missed vascular appt while in the hospital but has rescheduled for next week.  Past Medical History  Diagnosis Date  . Hypertension   . Prostate enlargement   . Blood stool     history of-no problems in past 5 years  . History of chicken pox     childhood  . GERD (gastroesophageal reflux disease)   . Arthritis     knee and back  . AAA (abdominal aortic aneurysm)    Past Surgical History  Procedure Date  . Knee surgery     1957    reports that he has quit smoking. He has never used smokeless tobacco. He reports that he does not drink alcohol or use illicit drugs. family history includes Arthritis in his mother; Diabetes in his mother; Heart failure in his daughter and mother; Hypertension in his mother; and Other in his daughter. Allergies  Allergen Reactions  . Lyrica (Pregabalin)      Review of Systems     Objective:   Physical Exam  Nursing note and vitals reviewed. Constitutional: He appears well-developed and  well-nourished. No distress.  HENT:  Head: Normocephalic and atraumatic.  Eyes: Conjunctivae are normal. No scleral icterus.  Cardiovascular: Normal rate, regular rhythm and normal heart sounds.   Pulmonary/Chest: Effort normal and breath sounds normal. No respiratory distress. He has no wheezes. He has no rales.  Neurological: He is alert.  Skin: Skin is warm and dry. He is not diaphoretic.  Psychiatric: He has a normal mood and affect.          Assessment & Plan:

## 2010-09-17 NOTE — Assessment & Plan Note (Signed)
Begin sample of symbicort 80 2 inhalations bid.

## 2010-09-17 NOTE — Assessment & Plan Note (Signed)
Reviewed low risk stress test results. asx

## 2010-09-17 NOTE — Assessment & Plan Note (Signed)
Obtain chem7 

## 2010-09-19 ENCOUNTER — Telehealth: Payer: Self-pay | Admitting: Internal Medicine

## 2010-09-19 MED ORDER — ALPRAZOLAM 0.25 MG PO TABS: 0.2500 mg | ORAL_TABLET | Freq: Three times a day (TID) | ORAL | Status: DC | PRN

## 2010-09-19 NOTE — Telephone Encounter (Signed)
Refill alprazolam .0.25 mg tab qty 30 take 1 tab up to 3 times daily for anziety last fill 08-22-2010

## 2010-09-19 NOTE — Telephone Encounter (Signed)
Refill alprazolam 0.25 mg tab take 1 3 time daily as needed for anxiety

## 2010-09-19 NOTE — Telephone Encounter (Signed)
Is it okay to provide refills for patient?

## 2010-09-19 NOTE — Telephone Encounter (Signed)
Verbal Rx refill called to pharmacist.

## 2010-09-19 NOTE — Telephone Encounter (Signed)
Ok with 1 rf

## 2010-09-20 ENCOUNTER — Encounter: Payer: Medicare PPO | Admitting: Vascular Surgery

## 2010-09-20 ENCOUNTER — Encounter: Payer: Self-pay | Admitting: Vascular Surgery

## 2010-09-20 ENCOUNTER — Ambulatory Visit (INDEPENDENT_AMBULATORY_CARE_PROVIDER_SITE_OTHER): Payer: Medicare PPO | Admitting: Vascular Surgery

## 2010-09-20 VITALS — BP 110/69 | HR 60 | Resp 16 | Ht 68.0 in | Wt 198.0 lb

## 2010-09-20 DIAGNOSIS — I719 Aortic aneurysm of unspecified site, without rupture: Secondary | ICD-10-CM

## 2010-09-20 NOTE — Progress Notes (Signed)
The patient presents today for evaluation of incidental finding of saccular aneurysm in his infrarenal aorta. He is a very pleasant 77 year old gentleman who is being evaluated for low back pain. He had low back pain and weakness into his right leg and foot. He also had some associated numbness. His MRI showed abnormality of his aorta and he underwent a subsequent CT angiogram which I have reviewed his films. He does have a significant history of COPD was recently hospitalized because of an exacerbation of this. He denies any cardiac difficulties. He does have hypertension. He has been on home oxygen occasionally since his recent hospitalization and feels that he is near his baseline. He also has trouble with urinary retention and failed a voiding trial today.  Past Medical History  Diagnosis Date  . Hypertension   . Prostate enlargement   . Blood stool     history of-no problems in past 5 years  . History of chicken pox     childhood  . GERD (gastroesophageal reflux disease)   . Arthritis     knee and back  . AAA (abdominal aortic aneurysm)     History  Substance Use Topics  . Smoking status: Former Smoker    Types: Cigarettes    Quit date: 07/30/2010  . Smokeless tobacco: Never Used   Comment: has not smoked since 07/30/2010  . Alcohol Use: No    Family History  Problem Relation Age of Onset  . Arthritis Mother   . Heart failure Mother   . Hypertension Mother   . Diabetes Mother   . Heart failure Daughter     deceased age 55  . Other Daughter     deceased age 79 MVA    Allergies  Allergen Reactions  . Lyrica (Pregabalin)     Current outpatient prescriptions:albuterol (VENTOLIN HFA) 108 (90 BASE) MCG/ACT inhaler, Inhale 2 puffs into the lungs every 4 (four) hours as needed.  , Disp: , Rfl: ;  ALPRAZolam (XANAX) 0.25 MG tablet, Take 1 tablet (0.25 mg total) by mouth 3 (three) times daily as needed for anxiety., Disp: 30 tablet, Rfl: 1;  amLODipine (NORVASC) 10 MG tablet,  Take 10 mg by mouth daily.  , Disp: , Rfl:  aspirin 81 MG tablet, Take 81 mg by mouth daily.  , Disp: , Rfl: ;  citalopram (CELEXA) 20 MG tablet, Take 1 tablet (20 mg total) by mouth daily., Disp: 30 tablet, Rfl: 6;  fludrocortisone (FLORINEF) 0.1 MG tablet, Take 0.1 mg by mouth daily.  , Disp: , Rfl: ;  loratadine (CLARITIN) 10 MG tablet, Take 10 mg by mouth daily.  , Disp: , Rfl: ;  Tamsulosin HCl (FLOMAX) 0.4 MG CAPS, Take 0.4 mg by mouth daily after supper.  , Disp: , Rfl:  traMADol (ULTRAM) 50 MG tablet, Take 2 tablets (100 mg total) by mouth once., Disp: 60 tablet, Rfl: 0  BP 110/69  Pulse 60  Resp 16  Ht 5\' 8"  (1.727 m)  Wt 198 lb (89.812 kg)  BMI 30.11 kg/m2  Body mass index is 30.11 kg/(m^2).        Review of systems: Gen. weight loss and loss of appetite. Cardiac chest pain and tightness. GI constipation and reflux. Neurologic dizziness or blackouts. Pulmonary wheezing and home oxygen. GU urinary frequency. ENT change in hearing. Musculoskeletal arthritis. Psychiatric anxiety. All other systems normal.  Physical exam well-developed well-nourished white male in no acute distress. HEENT normal chest clear bilaterally with no rales rhonchi or wheezes. Cardiovascular  heart regular rate and rhythm carotid arteries without bruits, 2+ radial femoral popliteal and dorsalis pedis pulses. Abdomen soft nontender no masses noted. Musculoskeletal no major deformity cyanosis. Neurologic no focal weakness or paresthesias. Skin without ulcers or rashes.  CT angiogram: Saccular aneurysm arising just below the right renal artery extending towards the vena cava.  Assessment and plan: Saccular infrarenal abdominal aortic aneurysm. I discussed this at length with the patient and his wife present. Concern regarding the appearance of this. I explained unlike a fusiform aneurysm these are more concerning for rupture. He has other issues including back pain urinary retention. His also had a cardiac  evaluation this week. Plan to see him again further discussion in one month hoping these issues are improving. I would recommend elective repair of his cardiac and pulmonary status are stable

## 2010-09-22 ENCOUNTER — Ambulatory Visit: Payer: Medicare PPO | Admitting: Internal Medicine

## 2010-10-11 ENCOUNTER — Telehealth: Payer: Self-pay | Admitting: *Deleted

## 2010-10-11 MED ORDER — ALPRAZOLAM 0.25 MG PO TABS: 0.2500 mg | ORAL_TABLET | Freq: Three times a day (TID) | ORAL | Status: DC | PRN

## 2010-10-11 NOTE — Telephone Encounter (Signed)
Can change to #90 but encourage to use sparingly when he can. Want to avoid developing tolerance to the medication or addiction

## 2010-10-11 NOTE — Telephone Encounter (Signed)
Patients wife called and left voice message requesting a qty change on patients xanax. She stated patient has been taking up to 3 times per day, but the quantity does not reflect that.She would like to know if Dr Rodena Medin would authorize qty change with refills to the pharmacy.

## 2010-10-11 NOTE — Telephone Encounter (Signed)
Call placed to [facility] pharmacy, spoke with [facility] tech, she requested Rx be left on voicemail, call transferred Rx left on voice mail. Patient is aware of Rx refill to pharmacy line.

## 2010-10-20 NOTE — Discharge Summary (Signed)
NAMEMarland Kitchen  Steve [name], Steve [name] NO.:  [id number]  MEDICAL RECORD NO.:  [id number]  LOCATION:  4709                         FACILITY:  [facility]  PHYSICIAN:  [name], MDDATE OF BIRTH:  [birth year]  DATE OF ADMISSION:  09/06/2010 DATE OF DISCHARGE:  09/11/2010                              DISCHARGE SUMMARY   DISCHARGE DIAGNOSES: 1. Orthostatic hypotension most likely secondary to autonomic     dysfunction. 2. Dehydration and acute renal failure. 3. Hypertension. 4. Urinary retention with a history of benign prostatic hypertrophy. 5. Chronic obstructive pulmonary disease with oxygen desaturation down     to 88% on exertion. 6. Anxiety and depression. 7. Chronic allergic rhinitis. 8. Increase forgetfulness. 9. Tobacco abuse. 10.Chronic back pain.  DISCHARGE MEDICATIONS: 1. Amlodipine 10 mg 1 tablet by mouth daily. 2. Fludrocortisone 0.1 mg 1 tablet by mouth daily. 3. Loratadine 10 mg 1 tablet by mouth daily. 4. Flomax 0.4 mg 1 tablet by mouth daily. 5. Albuterol 2 puffs inhale every 4 hours as needed for wheezing. 6. Alprazolam 0.25 mg 1 tablet by mouth three times a day as needed     for anxiety. 7. Aspirin 81 mg 1 tablet by mouth daily. 8. Celexa 20 mg 1 tablet by mouth daily. 9. Lyrica 50 mg 1 capsule by mouth twice a day. 10.Tramadol 50 mg 1 tablet by mouth twice a day as needed for pain.  DISPOSITION AND FOLLOWUP:  The patient had been discharged in stable and improved condition.  A regimen has been made in order to set up oxygen as an outpatient and he is going to follow with primary care physician for further adjustment of his medication.  During his next appointment, it will be important to reevaluate the patient's blood pressure and also to check a basic metabolic panel to make sure that his creatinine and electrolytes continued to be in the normal range.  The patient will also need to have oxygen saturation evaluation on room air in order  to determine further needs of his supplemental oxygen.  The patient needs to have his blood pressure checked and have his medications adjusted as needed depending on the results.  It will be also important to arrange pulmonary function test as an outpatient in order to determine if the patient will benefit of starting a Spiriva or any long-acting beta agonist at this point depending on the progression of his COPD.  PROCEDURE PERFORMED DURING THIS HOSPITALIZATION:  The patient had a chest x-ray two views on September 06, 2010, that demonstrated a small right pleural effusion, mild bibasilar opacities which are nonspecific represent most likely atelectasis or scarring.  The patient has an MRI of the brain due to forgetfulness as part of a workup for dementia that demonstrated diffuse periventricular  subcortical white matter changes bilaterally.  No acute intracranial abnormality.  Chronic right maxillary sinus disease.  There is a mild generalized atrophy likely within normal limits for his age.  No signs of acute stroke.  The patient had an x-ray on September 10, 2010, due to right side chest discomfort and increased cough that demonstrated just peribronchial thickening which is related to a chronic bronchitis or smoking.  No  acute infiltrates and similar small right pleural effusion which is insignificant.  No other procedures were performed during this hospitalization.  No consultations were made during this admission.  HISTORY OF PRESENT ILLNESS:  For full details, please refer to dictation done by Dr. Lonia Blood on September 06, 2010, but briefly this is a 77- year-old male with a history of hypertension, chronic back pain who came into the emergency department secondary to persistent lightheadedness when he change positions.  The patient has stated that yesterday he was seen at [facility], that was on September 05, 2010, where he was found to be orthostatic.  He received IV fluids and he  was sent home.  The patient returned to the emergency department because he was in bed all day due to lightheadedness sensation.  The patient reports associated nausea and abdominal discomfort.  According to family members and the patient, they report that the symptoms started about 1-2 weeks prior to admission and that the patient has decreased intake of both fluids and food.  Due to the persistently orthostatic changes and in need of further evaluation and treatment, the patient was admitted by [facility] for further workup.  PERTINENT LABORATORY DATA:  Throughout this hospitalization includes a urinalysis that was negative.  The patient has CBC with differential on admission with a white blood cells of 8.5, hemoglobin 14.1, platelets 165.  A BMET with a sodium of 139, potassium 4.0, chloride 104, bicarb 26, glucose 92, BUN 22, creatinine 1.22.  A pro BNP was 179.4.  Urine culture was no growth.  A magnesium level was 2.1, phosphorus 3.3. Cardiac markers negative x3.  The patient has a lipid profile with cholesterol 166, triglycerides 104, HDL 29, LDL 116, TSH and free T4 normal.  A vitamin B12 level was 654.  Cortisol level in the morning was 5.0 and vitamin D level was 62.  RPR was nonreactive.  At discharge, the patient had a CBC showing a white blood cells of 10.4, hemoglobin 12.9, platelets 188.  BMET sodium 140, potassium 4.2, chloride 103, bicarb 29, glucose 88, BUN 22, creatinine 0.92.  HOSPITAL COURSE BY PROBLEM: 1. For the patient's orthostatic changes initially most likely we     thought that was secondary to poor intake on top of being on     antihypertensive drugs, but the patient received fluid     resuscitation, making his blood pressure to be elevated while he     was lying down up to the 180s.  At the moment that he stood up, his     blood pressure would drop up to the 100s and sometimes into the 90s     range and that moment changes on his antihypertensive  medications     were made and the patient was started on Florinef.  Once he had     been on this medication, he had not had any further orthostatic     vital sign changes or lightheadedness sensation. 2. Hypertension.  Simplifying his regimen, the patient had been     started on amlodipine 10 mg by mouth daily and this medications has     been controlling his blood pressure wonderful.  Other decision to     use this medication was acute renal failure that brought the     patient into the hospital and the fact that he was unable despite     education to stop using the ARB that he was taking prior coming  into the hospital for blood pressure control. 3. Urinary retention with a history of BPH.  The patient had a urinary     retention requiring catheterization multiple times.  Urology phone     call was made and following recommendation from the [facility] with placement of an 18-inch [name] Coude catheter     into his bladder and the patient had been started on Flomax.  He is     going to follow with Urology as an outpatient in order to perform     voiding trials and remove the Foley. 4. COPD with oxygen desaturation down to 88% on exertion, currently     not having any exacerbation of his chronic obstructive pulmonary     disease.  At this moment, the patient is going to continue using     his p.r.n. albuterol.  We have arranged oxygen supplementation as     an outpatient and he had been advised to stop smoking. 5. Anxiety/depression.  We are going to continue using p.r.n. Xanax     and Celexa. 6. Chronic allergic rhinitis.  The patient had been started on     loratadine and we will continue using p.r.n. saline nasal spray. 7. Increased forgetfulness.  While the patient was here in the     hospital, family brought to our attention that he had been recently     forgetful and having difficulty remembering things and cannot know     being himself.  Mini-mental status exam  demonstrated a score of 24     having a mild dementia type of picture.  A whole workup including     vitamin B12, TSH, RPR, vitamin D, and MRI of the brain demonstrated     that there is no other explanation for his increased forgetfulness.     At this point supportive care and if the symptoms continue     progressing too rapidly, he will require some medications to slow     down the progression like Aricept or Namenda.  We are going to let     primary care physician to take the decision on adding this     medication to the patient's regimen. 8. Acute renal failure secondary to dehydration that completely     resolved after fluid resuscitation and stopping the ARB. 9. Tobacco abuse.  The patient received counseling and he is at this     point planning to quit smoking.  He will just require further     support from primary care physician and follow-up as an outpatient.     The patient received a quit smoking number in order to have further     support as an outpatient. 10.History of chronic back pain.  The patient will continue using     p.r.n. tramadol.  At discharge, temperature 98.3, heart rate 67, respiratory rate 17, blood pressure 112/64, oxygen saturation 90-93% on room air at rest with desaturation down to 88% on room air while ambulating.  In general no acute distress.  Respiratory system with a scattered rhonchi, currently no wheezing.  Heart with a regular rate and rhythm.  Abdomen soft, nontender with positive bowel sounds.  Extremities no edema.  Neurologic exam was nonfocal.     [name], MD     CEM/MEDQ  D:  09/11/2010  T:  09/12/2010  Job:  [id number]  cc:   Charlynn Court, MD  Electronically Signed by Vassie Loll MD on  10/20/2010 07:45:34 AM

## 2010-10-22 NOTE — H&P (Signed)
NAME:  Steve [name], Steve [name] NO.:  [id number]  MEDICAL RECORD NO.:  [id number]  LOCATION:  [facility]                         FACILITY:  [facility]  PHYSICIAN:  Lonia Blood, M.D.      DATE OF BIRTH:  Oct 16, 1933  DATE OF ADMISSION:  09/06/2010 DATE OF DISCHARGE:                             HISTORY & PHYSICAL   PRIMARY CARE PHYSICIAN:  [facility] conservation officer at [place], Dr. Charlynn Court.  PRESENTING COMPLAINT:  Dizziness and lightheadedness.  HISTORY OF PRESENT ILLNESS:  The patient is a 77 year old male with history of hypertension and chronic back pain, who came into the ED secondary to persistent lightheadedness.  It started yesterday, he was seen at [facility], he was orthostatic at that time.  He received IV fluids and sent home.  The patient returned today because he was in bed all day due to lightheadedness.  Associated with some nausea and abdominal discomfort.  The patient's symptoms started in the last 1-2 weeks.  He has been decreasing intake both fluids and food.  Family were worried which was what they took him to the emergency room yesterday. The patient is here and still persistently orthostatic.  But denied any other complaint.  No shortness of breath that is chest pain.  No cough. No fever.  No chills.  He denied any dysuria and no diarrhea.  No evidence of active blood loss.  PAST MEDICAL HISTORY:  Significant for hypertension, chronic back pain, anxiety disorder with some depression, and BPH.  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  ALLERGIES:  He has no known drug allergies.  MEDICATIONS:  Albuterol MDI, aspirin, Benicar, Celexa, Flomax, and Ultram.  SOCIAL HISTORY:  The patient lives in [place] with his wife.  He smokes about half pack per day.  No alcohol.  No IV drug use.  FAMILY HISTORY:  Denied any family history of "heart disease or coronary artery disease."  REVIEW OF SYSTEMS:  Mainly weakness, otherwise all systems reviewed and are  negative except per HPI.  PHYSICAL EXAMINATION:  VITAL SIGNS:  Temperature 97.9, blood pressure is 130/67 lying down, then 99/56 sitting, and 70/55 standing, his pulse went from 68-86 from laying to standing, respiratory rate of 19, and sats 96% on room air. GENERAL:  He is awake, alert, oriented, very active man who is in no acute distress. HEENT:  PERRL.  EOMI.  No pallor.  No jaundice.  No rhinorrhea. NECK:  Supple.  No JVD, no lymphadenopathy. RESPIRATORY:  He has good air entry bilaterally.  No wheezes or rales. No crackles. CARDIOVASCULAR SYSTEM:  He has S1 and S2, no audible murmur. ABDOMEN:  Soft, full, nontender with positive bowel sounds. EXTREMITIES:  No edema, cyanosis, or clubbing. SKIN:  The patient has multiple actinic keratosis in his face and upper extremities, but no obvious rashes or ulcers.  LABORATORY DATA:  Sodium is 139, potassium 4.0, chloride 104, CO2 of 26, glucose 92, BUN 22, creatinine 1.22 with a calcium 9.4.  His chest x-ray shows small right pleural effusion with mild bibasilar opacities which is nonspecific, mainly scarring in nature.  White count is 8.5, hemoglobin 14.1 with platelet count of 165.  His urinalysis showed  trace hemoglobin and small bilirubin, otherwise negative.  Urine, rbcs 3-6 with some granular casts, but no bacteria.  ASSESSMENT:  This is a 77 year old gentleman presenting with profound orthostatic hypotension.  Cause is not entirely clear, but the differentials varied more than likely this is secondary to autonomic dysfunction with central nervous system manifestations.  Some of which could remain any of these including multiple system atrophy, dementia with Parkinson, brain tumors, multiple infarcts including MS, progressive supranuclear palsy, ALS, all these are positive but less likely.  Other differentials include his medications.  The patient mainly autonomic dysfunction with peripheral nervous system manifestation which  could be drug related in this case.  I suspect his SSRI uses, the Celexa could be playing a big role here. Also there could be remotely some tumors but less likely.  Dehydration secondary to poor oral intake is possible, although his BUN and creatinine does not seem to be elevated tremendously but the BUN of 22 is very close to the upper limit of normal.  So I suspect his poor oral intake must be playing a lot of roles here.  He is on antihypertensive agent mainly Benicar, he is not on any diuretic.  Although tricyclic antidepressants and monoamine oxidase inhibitors and dopamine agonist are the main drugs implicated.  We still cannot rule out other causes including again SSRIs.  Other possibilities are endocrine disorders likes secondary adrenal insufficiency and simple sympathetic dystrophy.  PLAN: 1. Orthostatic hypotension.  Admit the patient to a monitored bed,     aggressively hydrate the patient and check his a.m. cortisol level.     His laying blood pressure is fine, so I am not going to give him     any steroids at this point.  Once I know what is going on with the     patient in terms of his adrenal level, we will make a decision as     to further treatment.  In the mean time, we will follow his     orthostasis daily.  And get PT/OT to work with the patient. 2. We will hold his SSRI, that Celexa for now. 3. Tobacco abuse.  I will put the patient on some nicotine patch and     tobacco cessation counseling. 4. Hypertension.  Again, I will continue with a other     antihypertensives since he is not on diuretic. 5. BPH.  Continue his Flomax. 6. Anxiety disorder.  I will give the Xanax, holding the Celexa for     now. 7. COPD.  Continue with albuterol inhaler.  Chronic back pain.  He is     on Ultram which will continue with pain control.     Lonia Blood, M.D.     Verlin Grills  D:  09/07/2010  T:  09/07/2010  Job:  [id number]  Electronically Signed by Lonia Blood M.D. on  10/22/2010 03:03:27 PM

## 2010-10-25 ENCOUNTER — Ambulatory Visit: Payer: Medicare PPO | Admitting: Vascular Surgery

## 2010-10-28 ENCOUNTER — Encounter: Payer: Self-pay | Admitting: Internal Medicine

## 2010-10-28 ENCOUNTER — Ambulatory Visit (INDEPENDENT_AMBULATORY_CARE_PROVIDER_SITE_OTHER): Payer: Medicare PPO | Admitting: Internal Medicine

## 2010-10-28 DIAGNOSIS — N400 Enlarged prostate without lower urinary tract symptoms: Secondary | ICD-10-CM

## 2010-10-28 DIAGNOSIS — Z23 Encounter for immunization: Secondary | ICD-10-CM

## 2010-10-28 DIAGNOSIS — I719 Aortic aneurysm of unspecified site, without rupture: Secondary | ICD-10-CM

## 2010-10-28 DIAGNOSIS — G4700 Insomnia, unspecified: Secondary | ICD-10-CM | POA: Insufficient documentation

## 2010-10-28 MED ORDER — ZOLPIDEM TARTRATE 5 MG PO TABS: 5.0000 mg | ORAL_TABLET | Freq: Every evening | ORAL | Status: DC | PRN

## 2010-10-28 NOTE — Assessment & Plan Note (Signed)
Complicated by urinary retention and need for foley catheter. Close follow up with urology

## 2010-10-28 NOTE — Assessment & Plan Note (Signed)
Attempt short term ambien prn. Reviewed potential side effects

## 2010-10-28 NOTE — Progress Notes (Signed)
  Subjective:    Patient ID: Steve [name], male    DOB: 01/10/1934, 77 y.o.   MRN: [id number]  HPI Pt presents to clinic for followup of multiple medical problems. S/p vascular surgery consult with recommendation for elective repair of aortic aneurysm incidentally noted on mri imaging. Right radicular leg pain improved with only focal 1-2 toe numbness without leg weakness. States now s/p back surgery consult without recommendation for surgery. Continues to see urology and maintains foley catheter with BPH and kidney stones. Attempts to remove catheter have resulted in urinary retention. Notes daytime fatigue but relates it to insomnia with daytime sleeping as compensation. Seems to describe days and night being flipped. Has avoided otc sleep aids due to concern over urinary retention. No other complaints.  Past Medical History  Diagnosis Date  . Hypertension   . Prostate enlargement   . Blood stool     history of-no problems in past 5 years  . History of chicken pox     childhood  . GERD (gastroesophageal reflux disease)   . Arthritis     knee and back  . AAA (abdominal aortic aneurysm)    Past Surgical History  Procedure Date  . Knee surgery     1957    reports that he quit smoking about 2 months ago. His smoking use included Cigarettes. He has never used smokeless tobacco. He reports that he does not drink alcohol or use illicit drugs. family history includes Arthritis in his mother; Diabetes in his mother; Heart failure in his daughter and mother; Hypertension in his mother; and Other in his daughter. Allergies  Allergen Reactions  . Lyrica (Pregabalin)      Review of Systems see hpi     Objective:   Physical Exam  Nursing note and vitals reviewed. Constitutional: He appears well-developed and well-nourished. No distress.  HENT:  Head: Normocephalic and atraumatic.  Eyes: Conjunctivae are normal. No scleral icterus.  Cardiovascular: Normal rate, regular rhythm and normal  heart sounds.  Exam reveals no gallop and no friction rub.   No murmur heard. Pulmonary/Chest: Effort normal and breath sounds normal. No respiratory distress. He has no wheezes. He has no rales.  Neurological: He is alert.  Skin: Skin is warm and dry. He is not diaphoretic.  Psychiatric: He has a normal mood and affect.          Assessment & Plan:

## 2010-10-28 NOTE — Assessment & Plan Note (Signed)
Stable and asx. Has scheduled follow up with vascular surgery this month. Reviewed with patient recent low risk dobutamine stress test. Do not see a medical contraindication for proceeding with vascular surgery however may benefit from pulmonary consult during hospitalization given h/o copd.

## 2010-11-07 ENCOUNTER — Encounter: Payer: Self-pay | Admitting: Vascular Surgery

## 2010-11-08 ENCOUNTER — Encounter: Payer: Self-pay | Admitting: Vascular Surgery

## 2010-11-08 ENCOUNTER — Ambulatory Visit (INDEPENDENT_AMBULATORY_CARE_PROVIDER_SITE_OTHER): Payer: Medicare HMO | Admitting: Vascular Surgery

## 2010-11-08 VITALS — BP 124/70 | HR 58 | Resp 20 | Ht 69.0 in | Wt 200.0 lb

## 2010-11-08 DIAGNOSIS — I714 Abdominal aortic aneurysm, without rupture, unspecified: Secondary | ICD-10-CM

## 2010-11-08 NOTE — Progress Notes (Signed)
The patient presents today for continued discussion of his incidental finding of a saccular infrarenal abdominal aortic aneurysm. Since my initial visit with him in September he is undergone a dobutamine stress test showing low risk for surgery. He continues to have no symptoms referable to his aneurysm. He is stable from a pulmonary standpoint. He does have urinary retention and has a chronic Foley.  Past Medical History  Diagnosis Date  . Hypertension   . Prostate enlargement   . Blood stool     history of-no problems in past 5 years  . History of chicken pox     childhood  . GERD (gastroesophageal reflux disease)   . Arthritis     knee and back  . AAA (abdominal aortic aneurysm)     History  Substance Use Topics  . Smoking status: Former Smoker    Types: Cigarettes    Quit date: 07/30/2010  . Smokeless tobacco: Never Used   Comment: has not smoked since 07/30/2010  . Alcohol Use: No    Family History  Problem Relation Age of Onset  . Arthritis Mother   . Heart failure Mother   . Hypertension Mother   . Diabetes Mother   . Heart failure Daughter     deceased age 23  . Other Daughter     deceased age 106 MVA    Allergies  Allergen Reactions  . Lyrica (Pregabalin)     Current outpatient prescriptions:albuterol (VENTOLIN HFA) 108 (90 BASE) MCG/ACT inhaler, Inhale 2 puffs into the lungs every 4 (four) hours as needed.  , Disp: , Rfl: ;  ALPRAZolam (XANAX) 0.25 MG tablet, Take 1 tablet (0.25 mg total) by mouth 3 (three) times daily as needed for anxiety., Disp: 90 tablet, Rfl: 2;  amLODipine (NORVASC) 10 MG tablet, Take 10 mg by mouth daily.  , Disp: , Rfl:  aspirin 81 MG tablet, Take 81 mg by mouth daily.  , Disp: , Rfl: ;  citalopram (CELEXA) 20 MG tablet, Take 1 tablet (20 mg total) by mouth daily., Disp: 30 tablet, Rfl: 6;  fludrocortisone (FLORINEF) 0.1 MG tablet, Take 0.1 mg by mouth daily.  , Disp: , Rfl: ;  loratadine (CLARITIN) 10 MG tablet, Take 10 mg by mouth daily.   , Disp: , Rfl: ;  Tamsulosin HCl (FLOMAX) 0.4 MG CAPS, Take 0.4 mg by mouth daily after supper.  , Disp: , Rfl:  traMADol (ULTRAM) 50 MG tablet, Take 2 tablets (100 mg total) by mouth once., Disp: 60 tablet, Rfl: 0;  zolpidem (AMBIEN) 5 MG tablet, Take 1 tablet (5 mg total) by mouth at bedtime as needed for sleep., Disp: 30 tablet, Rfl: 0  BP 124/70  Pulse 58  Resp 20  Ht 5\' 9"  (1.753 m)  Wt 200 lb (90.719 kg)  BMI 29.53 kg/m2  SpO2 94%  Body mass index is 29.53 kg/(m^2).       Review of systems no change from September 2012 visit  Physical exam: Well-developed well-nourished white male in no acute distress. HEENT normal. Chest clear bilaterally without wheezes. Heart regular rate and rhythm. 2+ radial pulses bilaterally. 2+ left posterior tibial and 2+ right dorsalis pedis pulse. Palpable popliteal arteries without aneurysm. Abdomen obese with no aneurysm palpated. Neurologic grossly intact.  Impression and plan: Saccular infrarenal abdominal aortic aneurysm. I again discussed this at length with the patient and his wife present. I have recommended elective resection. His aneurysm is too close to the renal arteries for treatment with an endograft. I  have recommended open aneurysm repair and explained the procedure including an expected 1-2 day ICU stay and total of 5-7 days hospitalization. I explained an expected less than 5% of mortality with the procedure. He understands and wishes to proceed. He does not have any history of urinary tract infection recently and we will check a UA to determine if he needs pre-surgical treatment. Surgery is scheduled for 11/14/2010.

## 2010-11-09 ENCOUNTER — Encounter (HOSPITAL_COMMUNITY): Payer: Self-pay | Admitting: Pharmacy Technician

## 2010-11-10 ENCOUNTER — Ambulatory Visit (HOSPITAL_COMMUNITY): Admission: RE | Admit: 2010-11-10 | Payer: Medicare HMO | Source: Ambulatory Visit

## 2010-11-10 ENCOUNTER — Ambulatory Visit (HOSPITAL_COMMUNITY)
Admission: RE | Admit: 2010-11-10 | Discharge: 2010-11-10 | Disposition: A | Discharge status: Home / Self Care | Payer: Medicare HMO | Source: Ambulatory Visit | Attending: Vascular Surgery | Admitting: Vascular Surgery

## 2010-11-10 ENCOUNTER — Encounter (HOSPITAL_COMMUNITY): Payer: Self-pay

## 2010-11-10 ENCOUNTER — Encounter (HOSPITAL_COMMUNITY)
Admission: RE | Admit: 2010-11-10 | Discharge: 2010-11-10 | Disposition: A | Discharge status: Home / Self Care | Payer: Medicare HMO | Source: Ambulatory Visit | Attending: Vascular Surgery | Admitting: Vascular Surgery

## 2010-11-10 DIAGNOSIS — Z01812 Encounter for preprocedural laboratory examination: Secondary | ICD-10-CM | POA: Insufficient documentation

## 2010-11-10 DIAGNOSIS — Z01818 Encounter for other preprocedural examination: Secondary | ICD-10-CM | POA: Insufficient documentation

## 2010-11-10 HISTORY — DX: Anxiety disorder, unspecified: F41.9

## 2010-11-10 LAB — URINALYSIS, ROUTINE W REFLEX MICROSCOPIC
Glucose, UA: NEGATIVE mg/dL
Protein, ur: 100 mg/dL — AB
Urobilinogen, UA: 1 mg/dL (ref 0.0–1.0)

## 2010-11-10 LAB — BLOOD GAS, ARTERIAL
Acid-base deficit: 0.2 mmol/L (ref 0.0–2.0)
Drawn by: 344381
O2 Saturation: 95.8 %
Patient temperature: 98.6

## 2010-11-10 LAB — URINE MICROSCOPIC-ADD ON

## 2010-11-10 LAB — CBC
MCHC: 32 g/dL (ref 30.0–36.0)
RDW: 14 % (ref 11.5–15.5)

## 2010-11-10 LAB — SURGICAL PCR SCREEN
MRSA, PCR: NEGATIVE
Staphylococcus aureus: POSITIVE — AB

## 2010-11-10 LAB — TYPE AND SCREEN
ABO/RH(D): A POS
Antibody Screen: NEGATIVE

## 2010-11-10 LAB — COMPREHENSIVE METABOLIC PANEL
AST: 14 U/L (ref 0–37)
Albumin: 3.1 g/dL — ABNORMAL LOW (ref 3.5–5.2)
Chloride: 106 mEq/L (ref 96–112)
Creatinine, Ser: 0.92 mg/dL (ref 0.50–1.35)
Total Bilirubin: 0.5 mg/dL (ref 0.3–1.2)
Total Protein: 7.1 g/dL (ref 6.0–8.3)

## 2010-11-10 LAB — PROTIME-INR
INR: 1.01 (ref 0.00–1.49)
Prothrombin Time: 13.5 seconds (ref 11.6–15.2)

## 2010-11-10 MED ORDER — DEXTROSE 5 % IV SOLN: 1.5000 g | Freq: Once | INTRAVENOUS | Status: DC

## 2010-11-10 MED ORDER — SODIUM CHLORIDE 0.9 % IV SOLN: INTRAVENOUS | Status: DC

## 2010-11-10 MED ORDER — DEXTROSE 5 % IV SOLN: 1.5000 g | INTRAVENOUS | Status: DC

## 2010-11-10 NOTE — Pre-Procedure Instructions (Signed)
20 Steve [name]  11/10/2010   Your procedure is scheduled on:  *MONDAY 11/14/10**  Report to [facility] at 530 AM.  Call this number if you have problems the morning of surgery: [phone]   Remember:   Do not eat food:After Midnight.  Do not drink clear liquids: 4 Hours before arrival.  Take these medicines the morning of surgery with A SIP OF WATER: ALBUTEROL, XANAX,AMLODIPINE,CELEXA, FLORINEF,CLARITIN,FLOMAX,ULTRAM,   Do not wear jewelry, make-up or nail polish.  Do not wear lotions, powders, or perfumes. You may wear deodorant.  Do not shave 48 hours prior to surgery.  Do not bring valuables to the hospital.  Contacts, dentures or bridgework may not be worn into surgery.  Leave suitcase in the car. After surgery it may be brought to your room.  For patients admitted to the hospital, checkout time is 11:00 AM the day of discharge.   Patients discharged the day of surgery will not be allowed to drive home.  Name and phone number of your driver:[name]    Special Instructions: CHG Shower Use Special Wash: 1/2 bottle night before surgery and 1/2 bottle morning of surgery.   Please read over the following fact sheets that you were given: Pain Booklet, MRSA Information and Surgical Site Infection Prevention

## 2010-11-11 ENCOUNTER — Telehealth: Payer: Self-pay | Admitting: Internal Medicine

## 2010-11-11 NOTE — Telephone Encounter (Signed)
Refill norvasc 10 mg tab qty 30 take 1 tab by mouth daily last fill 10-21-10 Refill fluorocort 0.1 mg tab qty 30 take 1 tab by mouth daily lasdt 10 1 12

## 2010-11-13 MED ORDER — DEXTROSE 5 % IV SOLN
1.5000 g | INTRAVENOUS | Status: DC
  Filled 2010-11-13: qty 1.5

## 2010-11-14 ENCOUNTER — Other Ambulatory Visit: Payer: Self-pay | Admitting: Vascular Surgery

## 2010-11-14 ENCOUNTER — Inpatient Hospital Stay (HOSPITAL_COMMUNITY): Payer: Medicare HMO

## 2010-11-14 ENCOUNTER — Inpatient Hospital Stay (HOSPITAL_COMMUNITY)
Admission: RE | Admit: 2010-11-14 | Discharge: 2010-11-21 | DRG: 237 | Disposition: A | Discharge status: Home / Self Care | Payer: Medicare HMO | Source: Ambulatory Visit | Attending: Vascular Surgery | Admitting: Vascular Surgery

## 2010-11-14 ENCOUNTER — Encounter (HOSPITAL_COMMUNITY)
Admission: RE | Disposition: A | Discharge status: Home / Self Care | Payer: Self-pay | Source: Ambulatory Visit | Attending: Vascular Surgery

## 2010-11-14 ENCOUNTER — Other Ambulatory Visit: Payer: Self-pay

## 2010-11-14 ENCOUNTER — Encounter (HOSPITAL_COMMUNITY): Payer: Self-pay | Admitting: Anesthesiology

## 2010-11-14 ENCOUNTER — Inpatient Hospital Stay (HOSPITAL_COMMUNITY): Payer: Medicare HMO | Admitting: Anesthesiology

## 2010-11-14 ENCOUNTER — Encounter (HOSPITAL_COMMUNITY): Payer: Self-pay | Admitting: Surgery

## 2010-11-14 ENCOUNTER — Other Ambulatory Visit: Payer: Self-pay | Admitting: Thoracic Diseases

## 2010-11-14 DIAGNOSIS — I714 Abdominal aortic aneurysm, without rupture, unspecified: Principal | ICD-10-CM | POA: Diagnosis present

## 2010-11-14 DIAGNOSIS — Z8261 Family history of arthritis: Secondary | ICD-10-CM

## 2010-11-14 DIAGNOSIS — Z79899 Other long term (current) drug therapy: Secondary | ICD-10-CM

## 2010-11-14 DIAGNOSIS — Z7982 Long term (current) use of aspirin: Secondary | ICD-10-CM

## 2010-11-14 DIAGNOSIS — Z8249 Family history of ischemic heart disease and other diseases of the circulatory system: Secondary | ICD-10-CM

## 2010-11-14 DIAGNOSIS — J449 Chronic obstructive pulmonary disease, unspecified: Secondary | ICD-10-CM

## 2010-11-14 DIAGNOSIS — R0902 Hypoxemia: Secondary | ICD-10-CM

## 2010-11-14 DIAGNOSIS — K219 Gastro-esophageal reflux disease without esophagitis: Secondary | ICD-10-CM | POA: Diagnosis present

## 2010-11-14 DIAGNOSIS — F341 Dysthymic disorder: Secondary | ICD-10-CM | POA: Diagnosis present

## 2010-11-14 DIAGNOSIS — E876 Hypokalemia: Secondary | ICD-10-CM | POA: Diagnosis not present

## 2010-11-14 DIAGNOSIS — D62 Acute posthemorrhagic anemia: Secondary | ICD-10-CM | POA: Diagnosis not present

## 2010-11-14 DIAGNOSIS — N138 Other obstructive and reflux uropathy: Secondary | ICD-10-CM | POA: Diagnosis present

## 2010-11-14 DIAGNOSIS — I10 Essential (primary) hypertension: Secondary | ICD-10-CM | POA: Diagnosis present

## 2010-11-14 DIAGNOSIS — J9600 Acute respiratory failure, unspecified whether with hypoxia or hypercapnia: Secondary | ICD-10-CM

## 2010-11-14 DIAGNOSIS — R339 Retention of urine, unspecified: Secondary | ICD-10-CM | POA: Diagnosis present

## 2010-11-14 DIAGNOSIS — Z87891 Personal history of nicotine dependence: Secondary | ICD-10-CM

## 2010-11-14 DIAGNOSIS — I4891 Unspecified atrial fibrillation: Secondary | ICD-10-CM | POA: Diagnosis not present

## 2010-11-14 DIAGNOSIS — R000 Tachycardia, unspecified: Secondary | ICD-10-CM | POA: Diagnosis not present

## 2010-11-14 DIAGNOSIS — N401 Enlarged prostate with lower urinary tract symptoms: Secondary | ICD-10-CM | POA: Diagnosis present

## 2010-11-14 DIAGNOSIS — Z833 Family history of diabetes mellitus: Secondary | ICD-10-CM

## 2010-11-14 DIAGNOSIS — F329 Major depressive disorder, single episode, unspecified: Secondary | ICD-10-CM | POA: Diagnosis present

## 2010-11-14 DIAGNOSIS — J4489 Other specified chronic obstructive pulmonary disease: Secondary | ICD-10-CM | POA: Diagnosis present

## 2010-11-14 DIAGNOSIS — E872 Acidosis, unspecified: Secondary | ICD-10-CM | POA: Diagnosis not present

## 2010-11-14 DIAGNOSIS — J9620 Acute and chronic respiratory failure, unspecified whether with hypoxia or hypercapnia: Secondary | ICD-10-CM | POA: Diagnosis not present

## 2010-11-14 DIAGNOSIS — I719 Aortic aneurysm of unspecified site, without rupture: Secondary | ICD-10-CM | POA: Diagnosis present

## 2010-11-14 HISTORY — PX: ABDOMINAL AORTIC ANEURYSM REPAIR: SHX42

## 2010-11-14 LAB — BASIC METABOLIC PANEL
CO2: 26 mEq/L (ref 19–32)
Chloride: 109 mEq/L (ref 96–112)
GFR calc Af Amer: 79 mL/min — ABNORMAL LOW (ref >90)
Potassium: 3.8 mEq/L (ref 3.5–5.1)
Sodium: 142 mEq/L (ref 135–145)

## 2010-11-14 LAB — MAGNESIUM: Magnesium: 1.9 mg/dL (ref 1.5–2.5)

## 2010-11-14 LAB — POCT I-STAT 7, (LYTES, BLD GAS, ICA,H+H)
Calcium, Ion: 1.15 mmol/L (ref 1.12–1.32)
HCT: 29 % — ABNORMAL LOW (ref 39.0–52.0)
Hemoglobin: 9.9 g/dL — ABNORMAL LOW (ref 13.0–17.0)
Potassium: 3.3 mEq/L — ABNORMAL LOW (ref 3.5–5.1)
Sodium: 141 mEq/L (ref 135–145)
pCO2 arterial: 44 mmHg (ref 35.0–45.0)
pH, Arterial: 7.316 — ABNORMAL LOW (ref 7.350–7.450)

## 2010-11-14 LAB — POCT I-STAT 3, ART BLOOD GAS (G3+)
Acid-base deficit: 1 mmol/L (ref 0.0–2.0)
Bicarbonate: 28 mEq/L — ABNORMAL HIGH (ref 20.0–24.0)
Bicarbonate: 27.3 mEq/L — ABNORMAL HIGH (ref 20.0–24.0)
Patient temperature: 36.4
Patient temperature: 36.9

## 2010-11-14 LAB — CBC
MCV: 96 fL (ref 78.0–100.0)
Platelets: 141 10*3/uL — ABNORMAL LOW (ref 150–400)
RBC: 3.27 MIL/uL — ABNORMAL LOW (ref 4.22–5.81)
RDW: 14.2 % (ref 11.5–15.5)
WBC: 16.4 10*3/uL — ABNORMAL HIGH (ref 4.0–10.5)

## 2010-11-14 LAB — PROTIME-INR
INR: 1.06 (ref 0.00–1.49)
Prothrombin Time: 14 seconds (ref 11.6–15.2)

## 2010-11-14 SURGERY — ANEURYSM ABDOMINAL AORTIC REPAIR
Anesthesia: General | Site: Abdomen

## 2010-11-14 MED ORDER — VECURONIUM BROMIDE 10 MG IV SOLR
INTRAVENOUS | Status: DC | PRN
  Administered 2010-11-14: 3 mg via INTRAVENOUS

## 2010-11-14 MED ORDER — PROTAMINE SULFATE 10 MG/ML IV SOLN
INTRAVENOUS | Status: DC | PRN
  Administered 2010-11-14: 50 mg via INTRAVENOUS

## 2010-11-14 MED ORDER — MEPERIDINE HCL 25 MG/ML IJ SOLN: 6.2500 mg | INTRAMUSCULAR | Status: DC | PRN

## 2010-11-14 MED ORDER — SODIUM CHLORIDE 0.9 % IV SOLN
10000.0000 ug | INTRAVENOUS | Status: DC | PRN
  Administered 2010-11-14: 50 ug/min via INTRAVENOUS

## 2010-11-14 MED ORDER — GLYCOPYRROLATE 0.2 MG/ML IJ SOLN
INTRAMUSCULAR | Status: DC | PRN
  Administered 2010-11-14: .8 mg via INTRAVENOUS

## 2010-11-14 MED ORDER — LACTATED RINGERS IV SOLN
INTRAVENOUS | Status: DC | PRN
  Administered 2010-11-14 (×2): via INTRAVENOUS

## 2010-11-14 MED ORDER — HYDROMORPHONE HCL PF 1 MG/ML IJ SOLN
INTRAMUSCULAR | Status: AC
  Filled 2010-11-14: qty 1

## 2010-11-14 MED ORDER — MORPHINE SULFATE 4 MG/ML IJ SOLN
INTRAMUSCULAR | Status: AC
  Administered 2010-11-14: 4 mg via INTRAVENOUS
  Filled 2010-11-14: qty 1

## 2010-11-14 MED ORDER — GUAIFENESIN-DM 100-10 MG/5ML PO SYRP: 15.0000 mL | ORAL_SOLUTION | ORAL | Status: DC | PRN

## 2010-11-14 MED ORDER — ONDANSETRON HCL 4 MG/2ML IJ SOLN
INTRAMUSCULAR | Status: DC | PRN
  Administered 2010-11-14: 4 mg via INTRAVENOUS

## 2010-11-14 MED ORDER — DOCUSATE SODIUM 100 MG PO CAPS
100.0000 mg | ORAL_CAPSULE | Freq: Every day | ORAL | Status: DC
  Administered 2010-11-15 – 2010-11-20 (×5): 100 mg via ORAL
  Filled 2010-11-14 (×6): qty 1

## 2010-11-14 MED ORDER — ACETAMINOPHEN 650 MG RE SUPP: 325.0000 mg | RECTAL | Status: DC | PRN

## 2010-11-14 MED ORDER — MORPHINE SULFATE 2 MG/ML IJ SOLN
2.0000 mg | INTRAMUSCULAR | Status: DC | PRN
  Administered 2010-11-14: 2 mg via INTRAVENOUS
  Filled 2010-11-14: qty 1

## 2010-11-14 MED ORDER — FAMOTIDINE IN NACL 20-0.9 MG/50ML-% IV SOLN
20.0000 mg | Freq: Two times a day (BID) | INTRAVENOUS | Status: DC
  Administered 2010-11-14 – 2010-11-18 (×9): 20 mg via INTRAVENOUS
  Filled 2010-11-14 (×18): qty 50

## 2010-11-14 MED ORDER — KCL IN DEXTROSE-NACL 20-5-0.9 MEQ/L-%-% IV SOLN
INTRAVENOUS | Status: DC
  Administered 2010-11-14 – 2010-11-15 (×3): via INTRAVENOUS
  Administered 2010-11-17: 20 mL/h via INTRAVENOUS
  Filled 2010-11-14 (×9): qty 1000

## 2010-11-14 MED ORDER — METOPROLOL TARTRATE 1 MG/ML IV SOLN: 2.0000 mg | INTRAVENOUS | Status: DC | PRN

## 2010-11-14 MED ORDER — MIDAZOLAM HCL 5 MG/5ML IJ SOLN
INTRAMUSCULAR | Status: DC | PRN
  Administered 2010-11-14 (×2): 1 mg via INTRAVENOUS

## 2010-11-14 MED ORDER — HETASTARCH-ELECTROLYTES 6 % IV SOLN
INTRAVENOUS | Status: DC | PRN
  Administered 2010-11-14 (×2): via INTRAVENOUS

## 2010-11-14 MED ORDER — MANNITOL 25 % IV SOLN
INTRAVENOUS | Status: DC | PRN
  Administered 2010-11-14: 25 g via INTRAVENOUS

## 2010-11-14 MED ORDER — ACETAMINOPHEN 325 MG PO TABS: 325.0000 mg | ORAL_TABLET | ORAL | Status: DC | PRN

## 2010-11-14 MED ORDER — POLYETHYLENE GLYCOL 3350 17 G PO PACK
17.0000 g | PACK | Freq: Every day | ORAL | Status: DC | PRN
  Filled 2010-11-14: qty 1

## 2010-11-14 MED ORDER — ALUM & MAG HYDROXIDE-SIMETH 400-400-40 MG/5ML PO SUSP
15.0000 mL | ORAL | Status: DC | PRN
  Filled 2010-11-14: qty 30

## 2010-11-14 MED ORDER — MAGNESIUM SULFATE 50 % IJ SOLN
2.0000 g | Freq: Once | INTRAVENOUS | Status: AC | PRN
  Filled 2010-11-14: qty 4

## 2010-11-14 MED ORDER — LACTATED RINGERS IV SOLN
INTRAVENOUS | Status: DC | PRN
  Administered 2010-11-14: 07:00:00 via INTRAVENOUS

## 2010-11-14 MED ORDER — ONDANSETRON HCL 4 MG/2ML IJ SOLN: 4.0000 mg | Freq: Once | INTRAMUSCULAR | Status: DC | PRN

## 2010-11-14 MED ORDER — PROPOFOL 10 MG/ML IV EMUL
INTRAVENOUS | Status: DC | PRN
  Administered 2010-11-14: 180 mg via INTRAVENOUS

## 2010-11-14 MED ORDER — ONDANSETRON HCL 4 MG/2ML IJ SOLN: 4.0000 mg | Freq: Four times a day (QID) | INTRAMUSCULAR | Status: DC | PRN

## 2010-11-14 MED ORDER — HYDRALAZINE HCL 20 MG/ML IJ SOLN
10.0000 mg | INTRAMUSCULAR | Status: DC | PRN
  Filled 2010-11-14: qty 0.5

## 2010-11-14 MED ORDER — BISACODYL 10 MG RE SUPP: 10.0000 mg | Freq: Every day | RECTAL | Status: DC | PRN

## 2010-11-14 MED ORDER — ROCURONIUM BROMIDE 100 MG/10ML IV SOLN
INTRAVENOUS | Status: DC | PRN
  Administered 2010-11-14: 40 mg via INTRAVENOUS
  Administered 2010-11-14: 10 mg via INTRAVENOUS
  Administered 2010-11-14: 50 mg via INTRAVENOUS

## 2010-11-14 MED ORDER — SODIUM CHLORIDE 0.9 % IV SOLN: 500.0000 mL | Freq: Once | INTRAVENOUS | Status: AC | PRN

## 2010-11-14 MED ORDER — METOPROLOL TARTRATE 1 MG/ML IV SOLN
5.0000 mg | Freq: Four times a day (QID) | INTRAVENOUS | Status: DC
  Administered 2010-11-14 – 2010-11-15 (×2): 5 mg via INTRAVENOUS
  Administered 2010-11-15: 2.5 mg via INTRAVENOUS
  Administered 2010-11-15: 5 mg via INTRAVENOUS
  Filled 2010-11-14 (×7): qty 5

## 2010-11-14 MED ORDER — PHENOL 1.4 % MT LIQD
1.0000 | OROMUCOSAL | Status: DC | PRN
  Filled 2010-11-14: qty 177

## 2010-11-14 MED ORDER — SODIUM CHLORIDE 0.9 % IR SOLN
Status: DC | PRN
  Administered 2010-11-14: 10:00:00

## 2010-11-14 MED ORDER — ALBUTEROL SULFATE (5 MG/ML) 0.5% IN NEBU
2.5000 mg | INHALATION_SOLUTION | RESPIRATORY_TRACT | Status: DC
  Administered 2010-11-14 – 2010-11-15 (×3): 2.5 mg via RESPIRATORY_TRACT
  Filled 2010-11-14 (×9): qty 0.5

## 2010-11-14 MED ORDER — SODIUM CHLORIDE 0.9 % IR SOLN
Status: DC | PRN
  Administered 2010-11-14: 2000 mL

## 2010-11-14 MED ORDER — NEOSTIGMINE METHYLSULFATE 1 MG/ML IJ SOLN
INTRAMUSCULAR | Status: DC | PRN
  Administered 2010-11-14: 5 mg via INTRAVENOUS

## 2010-11-14 MED ORDER — MUPIROCIN 2 % EX OINT
TOPICAL_OINTMENT | Freq: Two times a day (BID) | CUTANEOUS | Status: AC
  Administered 2010-11-15 – 2010-11-16 (×4): via NASAL
  Filled 2010-11-14: qty 22

## 2010-11-14 MED ORDER — POTASSIUM CHLORIDE CRYS ER 20 MEQ PO TBCR: 20.0000 meq | EXTENDED_RELEASE_TABLET | Freq: Once | ORAL | Status: AC | PRN

## 2010-11-14 MED ORDER — FENTANYL CITRATE 0.05 MG/ML IJ SOLN
INTRAMUSCULAR | Status: DC | PRN
  Administered 2010-11-14: 200 ug via INTRAVENOUS
  Administered 2010-11-14: 100 ug via INTRAVENOUS
  Administered 2010-11-14 (×2): 50 ug via INTRAVENOUS
  Administered 2010-11-14: 100 ug via INTRAVENOUS

## 2010-11-14 MED ORDER — MAGNESIUM HYDROXIDE 400 MG/5ML PO SUSP: 30.0000 mL | ORAL | Status: DC | PRN

## 2010-11-14 MED ORDER — DEXTROSE 5 % IV SOLN
1.5000 g | Freq: Two times a day (BID) | INTRAVENOUS | Status: AC
  Administered 2010-11-14 – 2010-11-15 (×2): 1.5 g via INTRAVENOUS
  Filled 2010-11-14 (×2): qty 1.5

## 2010-11-14 MED ORDER — AMLODIPINE BESYLATE 10 MG PO TABS: 10.0000 mg | ORAL_TABLET | Freq: Every day | ORAL | Status: DC

## 2010-11-14 MED ORDER — DOPAMINE-DEXTROSE 3.2-5 MG/ML-% IV SOLN: 3.0000 ug/kg/min | INTRAVENOUS | Status: DC

## 2010-11-14 MED ORDER — HEPARIN SODIUM (PORCINE) 1000 UNIT/ML IJ SOLN
INTRAMUSCULAR | Status: DC | PRN
  Administered 2010-11-14: 8000 [IU] via INTRAVENOUS

## 2010-11-14 MED ORDER — DEXTROSE 5 % IV SOLN
1.5000 g | INTRAVENOUS | Status: DC | PRN
  Administered 2010-11-14: 1.5 g via INTRAVENOUS

## 2010-11-14 MED ORDER — HYDROMORPHONE HCL PF 1 MG/ML IJ SOLN
0.2500 mg | INTRAMUSCULAR | Status: DC | PRN
  Administered 2010-11-14: 0.25 mg via INTRAVENOUS
  Administered 2010-11-14: 0.5 mg via INTRAVENOUS
  Administered 2010-11-14 (×5): 0.25 mg via INTRAVENOUS

## 2010-11-14 MED ORDER — LIDOCAINE HCL (CARDIAC) 20 MG/ML IV SOLN
INTRAVENOUS | Status: DC | PRN
  Administered 2010-11-14: 50 mg via INTRAVENOUS

## 2010-11-14 MED ORDER — LABETALOL HCL 5 MG/ML IV SOLN: 10.0000 mg | INTRAVENOUS | Status: DC | PRN

## 2010-11-14 SURGICAL SUPPLY — 56 items
APL SKNCLS STERI-STRIP NONHPOA (GAUZE/BANDAGES/DRESSINGS) ×1
BENZOIN TINCTURE PRP APPL 2/3 (GAUZE/BANDAGES/DRESSINGS) ×1 IMPLANT
CANISTER SUCTION 2500CC (MISCELLANEOUS) ×2 IMPLANT
CLOTH BEACON ORANGE TIMEOUT ST (SAFETY) ×2 IMPLANT
COVER SURGICAL LIGHT HANDLE (MISCELLANEOUS) ×4 IMPLANT
DRAPE U-SHAPE 76X120 STRL (DRAPES) ×1 IMPLANT
DRAPE WARM FLUID 44X44 (DRAPE) ×2 IMPLANT
DRSG COVADERM 4X8 (GAUZE/BANDAGES/DRESSINGS) ×1 IMPLANT
DRSG TELFA 4X14 ISLAND ADH (GAUZE/BANDAGES/DRESSINGS) ×1 IMPLANT
ELECT BLADE 4.0 EZ CLEAN MEGAD (MISCELLANEOUS) ×2
ELECT REM PT RETURN 9FT ADLT (ELECTROSURGICAL) ×4
ELECTRODE BLDE 4.0 EZ CLN MEGD (MISCELLANEOUS) IMPLANT
ELECTRODE REM PT RTRN 9FT ADLT (ELECTROSURGICAL) ×1 IMPLANT
GLOVE BIO SURGEON STRL SZ 6.5 (GLOVE) ×1 IMPLANT
GLOVE BIOGEL PI IND STRL 7.0 (GLOVE) IMPLANT
GLOVE BIOGEL PI INDICATOR 7.0 (GLOVE) ×7
GLOVE SS BIOGEL STRL SZ 6.5 (GLOVE) IMPLANT
GLOVE SS BIOGEL STRL SZ 7 (GLOVE) IMPLANT
GLOVE SS BIOGEL STRL SZ 7.5 (GLOVE) ×1 IMPLANT
GLOVE SUPERSENSE BIOGEL SZ 6.5 (GLOVE) ×1
GLOVE SUPERSENSE BIOGEL SZ 7 (GLOVE) ×2
GLOVE SUPERSENSE BIOGEL SZ 7.5 (GLOVE) ×1
GOWN STRL NON-REIN LRG LVL3 (GOWN DISPOSABLE) ×9 IMPLANT
GRAFT VASC 18MMX15CM (Graft) ×1 IMPLANT
INSERT FOGARTY 61MM (MISCELLANEOUS) IMPLANT
INSERT FOGARTY SM (MISCELLANEOUS) IMPLANT
KIT BASIN OR (CUSTOM PROCEDURE TRAY) ×2 IMPLANT
KIT ROOM TURNOVER OR (KITS) ×2 IMPLANT
NS IRRIG 1000ML POUR BTL (IV SOLUTION) ×4 IMPLANT
PACK AORTA (CUSTOM PROCEDURE TRAY) ×2 IMPLANT
PAD ARMBOARD 7.5X6 YLW CONV (MISCELLANEOUS) ×2 IMPLANT
SPECIMEN JAR MEDIUM (MISCELLANEOUS) ×2 IMPLANT
SPONGE LAP 18X18 X RAY DECT (DISPOSABLE) IMPLANT
STAPLER VISISTAT 35W (STAPLE) ×3 IMPLANT
STRIP CLOSURE SKIN 1/2X4 (GAUZE/BANDAGES/DRESSINGS) ×2 IMPLANT
STRIP CLOSURE SKIN 1X5 (GAUZE/BANDAGES/DRESSINGS) ×1 IMPLANT
SUT ETHIBOND 5 LR DA (SUTURE) IMPLANT
SUT PDS AB 1 TP1 54 (SUTURE) ×4 IMPLANT
SUT PROLENE 3 0 SH1 36 (SUTURE) ×4 IMPLANT
SUT PROLENE 5 0 C 1 24 (SUTURE) ×1 IMPLANT
SUT PROLENE 5 0 C 1 36 (SUTURE) ×1 IMPLANT
SUT SILK 2 0 (SUTURE) ×2
SUT SILK 2 0 SH CR/8 (SUTURE) ×2 IMPLANT
SUT SILK 2-0 18XBRD TIE 12 (SUTURE) IMPLANT
SUT SILK 3 0 (SUTURE) ×2
SUT SILK 3-0 18XBRD TIE 12 (SUTURE) IMPLANT
SUT VIC AB 2-0 CT1 36 (SUTURE) ×4 IMPLANT
SUT VIC AB 3-0 SH 27 (SUTURE) ×4
SUT VIC AB 3-0 SH 27X BRD (SUTURE) ×2 IMPLANT
SWAB COLLECTION DEVICE MRSA (MISCELLANEOUS) ×1 IMPLANT
TOWEL BLUE STERILE X RAY DET (MISCELLANEOUS) ×4 IMPLANT
TOWEL OR 17X24 6PK STRL BLUE (TOWEL DISPOSABLE) ×5 IMPLANT
TOWEL OR 17X26 10 PK STRL BLUE (TOWEL DISPOSABLE) ×4 IMPLANT
TRAY FOLEY CATH 14FRSI W/METER (CATHETERS) ×2 IMPLANT
TUBE ANAEROBIC SPECIMEN COL (MISCELLANEOUS) ×1 IMPLANT
WATER STERILE IRR 1000ML POUR (IV SOLUTION) ×4 IMPLANT

## 2010-11-14 NOTE — Interval H&P Note (Signed)
History and Physical Interval Note:   11/14/2010   7:24 AM   Steve [name]  has presented today for surgery, with the diagnosis of AAA  The various methods of treatment have been discussed with the patient and family. After consideration of risks, benefits and other options for treatment, the patient has consented to  Procedure(s): ANEURYSM ABDOMINAL AORTIC REPAIR as a surgical intervention .  The patients' history has been reviewed, patient examined, no change in status, stable for surgery.  I have reviewed the patients' chart and labs.  Questions were answered to the patient's satisfaction.     Larina Earthly  MD   [facility] SURGICAL H&P UPDATE    The patient has been re-examined and re-evaluated.  The patient's history and physical has been reviewed and is unchanged.  There is no change in pt. status or plan of care, stable for surgery.   EARLY,TODD F 11/14/2010 7:24 AM [facility]

## 2010-11-14 NOTE — Brief Op Note (Signed)
11/14/2010  12:16 PM  PATIENT:  Steve [name]  77 y.o. male  PRE-OPERATIVE DIAGNOSIS:  Abdominal Aortic Aneurysm  POST-OPERATIVE DIAGNOSIS:  Abdominal Aortic Aneurysm  PROCEDURE:  Procedure(s): ANEURYSM ABDOMINAL AORTIC REPAIR  SURGEON:  Surgeon(s): Larina Earthly, MD  PHYSICIAN ASSISTANT: Mikey Kirschner    ANESTHESIA:   general  EBL:  Total I/O In: 4585 [I.V.:3475; Blood:110; IV Piggyback:1000] Out: 800 [Urine:300; Blood:500]  BLOOD ADMINISTERED:110 CC CELLSAVER  DRAINS: none   LOCAL MEDICATIONS USED:  NONE  SPECIMEN:  Source of Specimen:  AAA Sac  DISPOSITION OF SPECIMEN:  PATHOLOGY  COUNTS:  YES  TOURNIQUET:  * No tourniquets in log *  DICTATION: .Other Dictation: Dictation Number   PLAN OF CARE: Admit to inpatient   PATIENT DISPOSITION:  PACU - hemodynamically stable.   Delay start of Pharmacological VTE agent (>24hrs) due to surgical blood loss or risk of bleeding:  yes

## 2010-11-14 NOTE — H&P (Signed)
[facility] H&P UPDATE  Date of Initial H&P:   The patient has been re-examined and re-evaluated.  The patient's history and physical has been reviewed and is unchanged.  There is no change in pt. status or plan of care, stable for surgery.   Steve [name] F 11/14/2010 7:18 AM [facility]

## 2010-11-14 NOTE — Telephone Encounter (Signed)
Call placed to patients wife Steve [name] [phone], she stated she was not sure if patient needed to continue with the flurocort. It was prescribed for him while he was in the hospital. She stated the refill was not needed right now. The patient is in the hospital having a aneurysm repaired. She was informed that Dr Rodena Medin was out of the office today and he will return on Wednesday. She stated that he will not need the rx refilled right away, so she is okay to wait for a return phone call. RX refill for Amlodipine sent to pharmacy.

## 2010-11-14 NOTE — Anesthesia Procedure Notes (Addendum)
Performed by: Margaree Mackintosh

## 2010-11-14 NOTE — Progress Notes (Signed)
Hospital Admission Note Date: 11/14/2010  Patient name: Steve [name] Medical record number: [id number] Date of birth: Jan 01, 1934 Age: 77 y.o. Gender: male PCP: Estill Cotta, MD  Medical Service: PCCM,MD  Brief history 77 y/o male with a AAA and COPD underwent an open AAA repair today.  PCCM consulted for post operative respiratory acidosis and hypoxemia.  Lines/tubes: 11/14/10 SGC >> 11/14/10 L radial a line>>  Microbiology/Sepsis markers:  Anti-infectives:  Cefuroxime (post op) >>  Best Practice/Protocols:    Consults:   Studies/events: 11/14/10 Open AAA repair  Chief Complaint: Post op resp failure  History of Present Illness: 77 y/o male with a AAA and COPD underwent an open AAA repair today.  PCCM consulted for post operative respiratory acidosis and hypoxemia.  He states that he uses albuterol about once a day at home for shortness of breath.  He underwent a open AAA repair and left the OR hemodynamically stable and was extubated and transferred to 2300.  He remained somnolent post operatively and was hypoxemic requiring 100% NRB (but in no respiratory distress).  An ABG was checked in the evening and Steve [name] was found to have respiratory acidosis so he was placed on bipap.  PCCM consulted for further management.  He is now more awake and feels well.  No complaints of dyspnea, cough, chest pain.  Allergies: Lyrica  Past Medical History  Diagnosis Date  . Hypertension   . Prostate enlargement   . Blood stool     history of-no problems in past 5 years  . History of chicken pox     childhood  . GERD (gastroesophageal reflux disease)   . Arthritis     knee and back  . AAA (abdominal aortic aneurysm)     requested stress test from [place]  . Anxiety   COPD  Past Surgical History  Procedure Date  . Knee surgery 1957    left knee arthotomy    Family History  Problem Relation Age of Onset  . Arthritis Mother   . Heart failure Mother   .  Hypertension Mother   . Diabetes Mother   . Heart failure Daughter     deceased age 35  . Other Daughter     deceased age 59 MVA    History   Social History  . Marital Status: Married    Spouse Name: N/A    Number of Children: N/A  . Years of Education: N/A   Occupational History  . Not on file.   Social History Main Topics  . Smoking status: Former Smoker    Types: Cigarettes    Quit date: 07/30/2010  . Smokeless tobacco: Never Used   Comment: has not smoked since 07/30/2010  . Alcohol Use: No  . Drug Use: No  . Sexually Active: Not on file   Other Topics Concern  . Not on file   Social History Narrative  . No narrative on file    Medications:3 I have reviewed the patient's current medications.    Review of Systems:   Constitutional:   No  weight loss, night sweats,  Fevers, chills, fatigue, lassitude. HEENT:   No headaches,  Difficulty swallowing,  Tooth/dental problems,  Sore throat,                No sneezing, itching, ear ache, nasal congestion, post nasal drip,   CV:  No chest pain,  Orthopnea, PND, he does have swelling in lower extremities, anasarca, dizziness, palpitations  GI  No heartburn, indigestion, post AAA repair abdominal pain, nausea, vomiting, diarrhea, change in bowel habits, loss of appetite  Resp: No current shortness of breath with exertion or at rest.  No excess mucus, no productive cough,  No non-productive cough,  No coughing up of blood.  No change in color of mucus.  He does have some wheezing.  No chest wall deformity  Skin: no rash or lesions.  GU: no dysuria, change in color of urine, no urgency or frequency.  No flank pain.  MS:  No joint pain or swelling.  No decreased range of motion.  No back pain.  Psych:  No change in mood or affect. No depression or anxiety.  No memory loss.    Physical Exam: Patient Vitals for the past 3 hrs:  BP Pulse Resp SpO2  11/14/10 2200 110/63 mmHg 62  16  96 %  11/14/10 2100 147/55 mmHg  64  28  98 %  11/14/10 2030 147/55 mmHg 64  28  98 %  ]  Intake/Output Summary (Last 24 hours) at 11/14/10 2316 Last data filed at 11/14/10 2200  Gross per 24 hour  Intake   5090 ml  Output   1370 ml  Net   3720 ml    Gen: Chronically ill appearing, comfortable on BIPAP, no complaints  ENT: NCAT, PERRL, BIPAP in place  Neck: No JVD, no TMG, no carotid bruits  Lungs: Some wheezes noted in bases bilaterally, no crackles, no accessory muscle use  Cardiovascular: RRR, heart sounds normal, no murmur or gallops, no peripheral edema  Abdomen: no bowel sounds, midline surgical scar well dressed  Musculoskeletal: No deformities, no cyanosis or clubbing  Neuro: alert, non focal  Skin: Warm, no lesions or rashes   LAB RESULTS I have reviewed the patient's lab results.  Lab 11/14/10 1410 11/14/10 1218 11/10/10 1429  NA 142 141 144  K 3.8 3.3* --  CL 109 -- 106  CO2 26 -- 25  GLUCOSE 130* -- 101*  BUN 18 -- 17  CREATININE 1.04 -- 0.92  CALCIUM 8.2* -- 9.7  MG 1.9 -- --  PHOS -- -- --  AST -- -- 14  ALT -- -- 18  ALKPHOS -- -- 113  BILITOT -- -- 0.5  PROT -- -- 7.1  ALBUMIN -- -- 3.1*  LIPASE -- -- --  AMYLASE -- -- --  AMMONIA -- -- --  WBC 16.4* -- 8.4  NEUTROABS -- -- --  HGB 10.4* 9.9* 12.6*  HCT 31.4* 29.0* 39.4  MCV 96.0 -- 95.2  PLT 141* -- 215  CKTOTAL -- -- --  CKMB -- -- --  CKMBINDEX -- -- --  TROPONINI -- -- --  GLUCAP -- -- --   ABG 7.27/59/87/27  Radiology:  Assessment & Plan    NEURO  Altered Mental Status:  stupor: improving after initially being somonlent post op (likely due to hypercarbia and anesthesia), now awake and aler   Plan: -continue bipap,  -judicious pain med use  PULM  Respiratory Acidosis (acute) Acute Respiratory Failure (due to obstructive lung disease) and Hypoxemia COPD Hypoxemia now likely due to COPD and possibly volume overload (3.7 Liters positive); Initially more hypercarbic, but improving as he wakes up.  Still  wheezing somewhat.   Plan: -start nebulized albuterol -continue bipap until morning -repeat abg in AM -portable CXR now to assess for pulm edema  CARDIO  S/P AAA repair today.   Plan: per vascular surgery -follow i's/o's -serial enzymes and EKG  RENAL  Hypokalemia moderate (2.8 - 3.5 meq/dl)   Plan: repleted  GI  No issues   Plan: NPO  ID  No Issues   Plan: monitor WBC, fever curve  HEME  Anemia acute blood loss anemia) not actively bleeding, hemodynamically stable now   Plan: monitor cbc  ENDO No Issues   Plan: Monitor daily glucose  Global Issues

## 2010-11-14 NOTE — Anesthesia Postprocedure Evaluation (Signed)
  Anesthesia Post-op Note  Patient: Steve [name]  Procedure(s) Performed:  ANEURYSM ABDOMINAL AORTIC REPAIR  Patient Location: PACU and ICU  Anesthesia Type: General  Level of Consciousness: patient cooperative  Airway and Oxygen Therapy: Patient connected to face mask oxygen  Post-op Pain: moderate  Post-op Assessment: Patient's Cardiovascular Status Stable  Post-op Vital Signs: stable  Complications: No apparent anesthesia complications

## 2010-11-14 NOTE — Brief Op Note (Deleted)
11/14/2010  11:52 AM  PATIENT:  Steve [name]  77 y.o. male  PRE-OPERATIVE DIAGNOSIS:  Abdominal Aortic Aneurysm  POST-OPERATIVE DIAGNOSIS:  Abdominal Aortic Aneurysm  PROCEDURE:  Procedure(s): ANEURYSM ABDOMINAL AORTIC REPAIR  SURGEON:  Surgeon(s): Larina Earthly, MD  PHYSICIAN ASSISTANT:   ASSISTANTS: Gerturde Kuba, PA-C   ANESTHESIA:   general  EBL:  Total I/O In: 3110 [I.V.:2000; Blood:110; IV Piggyback:1000] Out: 800 [Urine:300; Blood:500]  BLOOD ADMINISTERED:110 CC CELLSAVER  DRAINS: none   LOCAL MEDICATIONS USED:  NONE  SPECIMEN:  Source of Specimen:  aaa sac  DISPOSITION OF SPECIMEN:  PATHOLOGY  COUNTS:  YES  TOURNIQUET:  * No tourniquets in log *  DICTATION: .Other Dictation: Dictation Number   PLAN OF CARE: Admit to inpatient   PATIENT DISPOSITION:  PACU - hemodynamically stable.   Delay start of Pharmacological VTE agent (>24hrs) due to surgical blood loss or risk of bleeding:  yes

## 2010-11-14 NOTE — Anesthesia Preprocedure Evaluation (Addendum)
Anesthesia Evaluation  Patient identified by MRN, date of birth, ID band Patient awake    Reviewed: Allergy & Precautions, H&P , NPO status , Patient's Chart, lab work & pertinent test results, reviewed documented beta blocker date and time   Airway Mallampati: II      Dental  (+) Edentulous Upper and Edentulous Lower   Pulmonary          Cardiovascular regular     Neuro/Psych    GI/Hepatic   Endo/Other    Renal/GU      Musculoskeletal   Abdominal   Peds  Hematology   Anesthesia Other Findings   Reproductive/Obstetrics                          Anesthesia Physical Anesthesia Plan  ASA: III  Anesthesia Plan: General   Post-op Pain Management:    Induction:   Airway Management Planned:   Additional Equipment:   Intra-op Plan:   Post-operative Plan:   Informed Consent: I have reviewed the patients History and Physical, chart, labs and discussed the procedure including the risks, benefits and alternatives for the proposed anesthesia with the patient or authorized representative who has indicated his/her understanding and acceptance.     Plan Discussed with: Anesthesiologist, CRNA and Surgeon  Anesthesia Plan Comments:         Anesthesia Quick Evaluation

## 2010-11-14 NOTE — H&P (Deleted)
[facility] H&P UPDATE    The patient has been re-examined and re-evaluated.  The patient's history and physical has been reviewed and is unchanged.  There is no change in pt. status or plan of care, stable for surgery.   Steve [name] F 11/14/2010 7:15 AM [facility]

## 2010-11-14 NOTE — Preoperative (Signed)
Beta Blockers   Reason not to administer Beta Blockers:Not Applicable 

## 2010-11-14 NOTE — Transfer of Care (Signed)
Immediate Anesthesia Transfer of Care Note  Patient: Steve [name]  Procedure(s) Performed:  ANEURYSM ABDOMINAL AORTIC REPAIR  Patient Location: PACU  Anesthesia Type: General  Level of Consciousness: awake, alert , oriented and responds to stimulation  Airway & Oxygen Therapy: pt on CPAP 15cm Ipap, 8 epap  Post-op Assessment: Report given to PACU RN  Post vital signs: stable  Complications: No apparent anesthesia complications

## 2010-11-14 NOTE — H&P (Signed)
Progress Notes     Aydia Maj F, MD 11/08/2010 6:33 PM Signed  The patient presents today for continued discussion of his incidental finding of a saccular infrarenal abdominal aortic aneurysm. Since my initial visit with him in September he is undergone a dobutamine stress test showing low risk for surgery. He continues to have no symptoms referable to his aneurysm. He is stable from a pulmonary standpoint. He does have urinary retention and has a chronic Foley.     Past Medical History     Diagnosis  Date     .  Hypertension      .  Prostate enlargement      .  Blood stool        history of-no problems in past 5 years     .  History of chicken pox        childhood     .  GERD (gastroesophageal reflux disease)      .  Arthritis        knee and back     .  AAA (abdominal aortic aneurysm)          History     Substance Use Topics     .  Smoking status:  Former Smoker       Types:  Cigarettes       Quit date:  07/30/2010     .  Smokeless tobacco:  Never Used      Comment: has not smoked since 07/30/2010      .  Alcohol Use:  No           Family History      Problem  Relation  Age of Onset      .  Arthritis  Mother       .  Heart failure  Mother       .  Hypertension  Mother       .  Diabetes  Mother       .  Heart failure  Daughter         deceased age 43       .  Other  Daughter          deceased age 56 MVA             Allergies       Allergen  Reactions       .  Lyrica (Pregabalin)         Current outpatient prescriptions:albuterol (VENTOLIN HFA) 108 (90 BASE) MCG/ACT inhaler, Inhale 2 puffs into the lungs every 4 (four) hours as needed. , Disp: , Rfl: ; ALPRAZolam (XANAX) 0.25 MG tablet, Take 1 tablet (0.25 mg total) by mouth 3 (three) times daily as needed for anxiety., Disp: 90 tablet, Rfl: 2; amLODipine (NORVASC) 10 MG tablet, Take 10 mg by mouth daily. , Disp: , Rfl:  aspirin 81 MG tablet, Take 81 mg by mouth daily. , Disp: , Rfl: ; citalopram (CELEXA) 20 MG tablet,  Take 1 tablet (20 mg total) by mouth daily., Disp: 30 tablet, Rfl: 6; fludrocortisone (FLORINEF) 0.1 MG tablet, Take 0.1 mg by mouth daily. , Disp: , Rfl: ; loratadine (CLARITIN) 10 MG tablet, Take 10 mg by mouth daily. , Disp: , Rfl: ; Tamsulosin HCl (FLOMAX) 0.4 MG CAPS, Take 0.4 mg by mouth daily after supper. , Disp: , Rfl:  traMADol (ULTRAM) 50 MG tablet, Take 2 tablets (100 mg total) by mouth once., Disp: 60 tablet, Rfl: 0; zolpidem (AMBIEN) 5  MG tablet, Take 1 tablet (5 mg total) by mouth at bedtime as needed for sleep., Disp: 30 tablet, Rfl: 0  BP 124/70  Pulse 58  Resp 20  Ht 5\' 9"  (1.753 m)  Wt 200 lb (90.719 kg)  BMI 29.53 kg/m2  SpO2 94%  Body mass index is 29.53 kg/(m^2).  Review of systems no change from September 2012 visit  Physical exam: Well-developed well-nourished white male in no acute distress. HEENT normal. Chest clear bilaterally without wheezes. Heart regular rate and rhythm. 2+ radial pulses bilaterally. 2+ left posterior tibial and 2+ right dorsalis pedis pulse. Palpable popliteal arteries without aneurysm. Abdomen obese with no aneurysm palpated. Neurologic grossly intact.  Impression and plan: Saccular infrarenal abdominal aortic aneurysm. I again discussed this at length with the patient and his wife present. I have recommended elective resection. His aneurysm is too close to the renal arteries for treatment with an endograft. I have recommended open aneurysm repair and explained the procedure including an expected 1-2 day ICU stay and total of 5-7 days hospitalization. I explained an expected less than 5% of mortality with the procedure. He understands and wishes to proceed. He does not have any history of urinary tract infection recently and we will check a UA to determine if he needs pre-surgical treatment. Surgery is scheduled for 11/14/2010.        Electronic signature on 11/08/2010          Not recorded                               Level of  Service  Follow-up and Disposition    PR OFFICE/OUTPT VISIT,EST,LEVL IV [04540]  Routing History Recorded           All Flowsheet Templates (all recorded)     Encounter Vitals Flowsheet   Custom Formula Data Flowsheet   Anthropometrics Flowsheet                           Referring Provider          [facility].            All Charges for This Encounter       Code  Description  Service Date  Service Provider  Modifiers  Quantity    [id number]  PR OFFICE/OUTPT VISIT,EST,LEVL IV  11/08/2010  Larina Earthly, MD   1                Other Encounter Related Information     Allergies & Medications      Problem List      History      Patient-Entered Questionnaires       No data filed

## 2010-11-15 ENCOUNTER — Inpatient Hospital Stay (HOSPITAL_COMMUNITY): Payer: Medicare HMO

## 2010-11-15 ENCOUNTER — Other Ambulatory Visit: Payer: Self-pay

## 2010-11-15 DIAGNOSIS — I10 Essential (primary) hypertension: Secondary | ICD-10-CM | POA: Diagnosis present

## 2010-11-15 DIAGNOSIS — R0902 Hypoxemia: Secondary | ICD-10-CM

## 2010-11-15 DIAGNOSIS — J9620 Acute and chronic respiratory failure, unspecified whether with hypoxia or hypercapnia: Secondary | ICD-10-CM | POA: Diagnosis not present

## 2010-11-15 DIAGNOSIS — J449 Chronic obstructive pulmonary disease, unspecified: Secondary | ICD-10-CM

## 2010-11-15 DIAGNOSIS — I714 Abdominal aortic aneurysm, without rupture: Secondary | ICD-10-CM

## 2010-11-15 DIAGNOSIS — J9600 Acute respiratory failure, unspecified whether with hypoxia or hypercapnia: Secondary | ICD-10-CM

## 2010-11-15 LAB — POCT I-STAT 3, ART BLOOD GAS (G3+)
Acid-base deficit: 1 mmol/L (ref 0.0–2.0)
Acid-base deficit: 3 mmol/L — ABNORMAL HIGH (ref 0.0–2.0)
Acid-base deficit: 3 mmol/L — ABNORMAL HIGH (ref 0.0–2.0)
Bicarbonate: 24.3 mEq/L — ABNORMAL HIGH (ref 20.0–24.0)
O2 Saturation: 92 %
Patient temperature: 37.4
Patient temperature: 99.1
pH, Arterial: 7.301 — ABNORMAL LOW (ref 7.350–7.450)
pO2, Arterial: 75 mmHg — ABNORMAL LOW (ref 80.0–100.0)
pO2, Arterial: 94 mmHg (ref 80.0–100.0)

## 2010-11-15 LAB — CBC
MCH: 32.5 pg (ref 26.0–34.0)
MCHC: 33.1 g/dL (ref 30.0–36.0)
Platelets: 120 10*3/uL — ABNORMAL LOW (ref 150–400)
RDW: 14.5 % (ref 11.5–15.5)

## 2010-11-15 LAB — CARDIAC PANEL(CRET KIN+CKTOT+MB+TROPI)
Relative Index: 1.5 (ref 0.0–2.5)
Relative Index: 2.2 (ref 0.0–2.5)
Relative Index: 2.2 (ref 0.0–2.5)
Total CK: 372 U/L — ABNORMAL HIGH (ref 7–232)
Total CK: 626 U/L — ABNORMAL HIGH (ref 7–232)
Troponin I: <0.3 ng/mL (ref <0.30)

## 2010-11-15 LAB — COMPREHENSIVE METABOLIC PANEL
ALT: 15 U/L (ref 0–53)
AST: 14 U/L (ref 0–37)
Albumin: 2.1 g/dL — ABNORMAL LOW (ref 3.5–5.2)
Calcium: 8.3 mg/dL — ABNORMAL LOW (ref 8.4–10.5)
GFR calc Af Amer: 77 mL/min — ABNORMAL LOW (ref >90)
Glucose, Bld: 119 mg/dL — ABNORMAL HIGH (ref 70–99)
Sodium: 143 mEq/L (ref 135–145)
Total Protein: 5.3 g/dL — ABNORMAL LOW (ref 6.0–8.3)

## 2010-11-15 LAB — BASIC METABOLIC PANEL
BUN: 21 mg/dL (ref 6–23)
GFR calc Af Amer: 74 mL/min — ABNORMAL LOW (ref >90)
GFR calc non Af Amer: 64 mL/min — ABNORMAL LOW (ref >90)
Potassium: 4.1 mEq/L (ref 3.5–5.1)

## 2010-11-15 MED ORDER — ALBUTEROL SULFATE (5 MG/ML) 0.5% IN NEBU: 2.5000 mg | INHALATION_SOLUTION | Freq: Four times a day (QID) | RESPIRATORY_TRACT | Status: DC

## 2010-11-15 MED ORDER — SODIUM CHLORIDE 0.9 % IV SOLN
0.2000 ug/kg/h | INTRAVENOUS | Status: DC
  Administered 2010-11-15: 0.4 ug/kg/h via INTRAVENOUS
  Administered 2010-11-15: 0.3 ug/kg/h via INTRAVENOUS
  Administered 2010-11-16 (×3): 0.5 ug/kg/h via INTRAVENOUS
  Administered 2010-11-16: 0.4 ug/kg/h via INTRAVENOUS
  Administered 2010-11-17: 0.5 ug/kg/h via INTRAVENOUS
  Filled 2010-11-15 (×8): qty 2

## 2010-11-15 MED ORDER — NALOXONE HCL 0.4 MG/ML IJ SOLN: 0.4000 mg | INTRAMUSCULAR | Status: DC | PRN

## 2010-11-15 MED ORDER — MIDAZOLAM HCL 2 MG/2ML IJ SOLN
INTRAMUSCULAR | Status: AC
  Administered 2010-11-15: 2 mg via INTRAVENOUS
  Filled 2010-11-15: qty 2

## 2010-11-15 MED ORDER — MORPHINE SULFATE (PF) 1 MG/ML IV SOLN
INTRAVENOUS | Status: DC
  Administered 2010-11-15 (×2): via INTRAVENOUS
  Administered 2010-11-15: 10.5 mg via INTRAVENOUS
  Filled 2010-11-15 (×3): qty 30

## 2010-11-15 MED ORDER — ALBUTEROL SULFATE (5 MG/ML) 0.5% IN NEBU
2.5000 mg | INHALATION_SOLUTION | Freq: Four times a day (QID) | RESPIRATORY_TRACT | Status: DC
  Administered 2010-11-15 – 2010-11-16 (×4): 2.5 mg via RESPIRATORY_TRACT
  Filled 2010-11-15 (×8): qty 0.5

## 2010-11-15 MED ORDER — MIDAZOLAM HCL 2 MG/2ML IJ SOLN
2.0000 mg | INTRAMUSCULAR | Status: DC | PRN
  Administered 2010-11-15 – 2010-11-16 (×7): 2 mg via INTRAVENOUS
  Administered 2010-11-16: 4 mg via INTRAVENOUS
  Administered 2010-11-16 (×4): 2 mg via INTRAVENOUS
  Administered 2010-11-17: 4 mg via INTRAVENOUS
  Filled 2010-11-15 (×3): qty 2
  Filled 2010-11-15: qty 4
  Filled 2010-11-15: qty 2
  Filled 2010-11-15: qty 4
  Filled 2010-11-15 (×4): qty 2

## 2010-11-15 MED ORDER — SODIUM CHLORIDE 0.9 % IJ SOLN: 9.0000 mL | INTRAMUSCULAR | Status: DC | PRN

## 2010-11-15 MED ORDER — DIPHENHYDRAMINE HCL 12.5 MG/5ML PO ELIX: 12.5000 mg | ORAL_SOLUTION | Freq: Four times a day (QID) | ORAL | Status: DC | PRN

## 2010-11-15 MED ORDER — MIDAZOLAM HCL 2 MG/2ML IJ SOLN
INTRAMUSCULAR | Status: AC
  Administered 2010-11-15: 2 mg via INTRAVENOUS
  Filled 2010-11-15: qty 4

## 2010-11-15 MED ORDER — IPRATROPIUM BROMIDE 0.02 % IN SOLN
0.5000 mg | Freq: Four times a day (QID) | RESPIRATORY_TRACT | Status: DC
  Administered 2010-11-15 – 2010-11-16 (×4): 0.5 mg via RESPIRATORY_TRACT
  Filled 2010-11-15 (×8): qty 2.5

## 2010-11-15 MED ORDER — ONDANSETRON HCL 4 MG/2ML IJ SOLN: 4.0000 mg | Freq: Four times a day (QID) | INTRAMUSCULAR | Status: DC | PRN

## 2010-11-15 MED ORDER — SODIUM CHLORIDE 0.9 % IV SOLN
INTRAVENOUS | Status: DC
  Administered 2010-11-15: 20:00:00 via INTRAVENOUS

## 2010-11-15 MED ORDER — SODIUM CHLORIDE 0.9 % IV BOLUS (SEPSIS): 1000.0000 mL | Freq: Once | INTRAVENOUS | Status: DC

## 2010-11-15 MED ORDER — FENTANYL CITRATE 0.05 MG/ML IJ SOLN
INTRAMUSCULAR | Status: AC
  Administered 2010-11-15: 50 ug
  Filled 2010-11-15: qty 2

## 2010-11-15 MED ORDER — ROCURONIUM BROMIDE 50 MG/5ML IV SOLN
0.6000 mg/kg | Freq: Once | INTRAVENOUS | Status: DC
  Filled 2010-11-15: qty 5.73

## 2010-11-15 MED ORDER — DIPHENHYDRAMINE HCL 50 MG/ML IJ SOLN: 12.5000 mg | Freq: Four times a day (QID) | INTRAMUSCULAR | Status: DC | PRN

## 2010-11-15 MED ORDER — ALBUTEROL SULFATE (5 MG/ML) 0.5% IN NEBU: 2.5000 mg | INHALATION_SOLUTION | RESPIRATORY_TRACT | Status: DC | PRN

## 2010-11-15 MED ORDER — MORPHINE SULFATE 4 MG/ML IJ SOLN
2.0000 mg | INTRAMUSCULAR | Status: DC | PRN
  Administered 2010-11-14 – 2010-11-15 (×3): 4 mg via INTRAVENOUS
  Filled 2010-11-15 (×2): qty 1

## 2010-11-15 MED ORDER — FENTANYL CITRATE 0.05 MG/ML IJ SOLN
25.0000 ug | INTRAMUSCULAR | Status: DC | PRN
  Administered 2010-11-16 (×2): 50 ug via INTRAVENOUS
  Filled 2010-11-15 (×2): qty 2

## 2010-11-15 MED ORDER — ETOMIDATE 2 MG/ML IV SOLN
10.0000 mg | Freq: Once | INTRAVENOUS | Status: AC
  Administered 2010-11-15: 10 mg via INTRAVENOUS

## 2010-11-15 NOTE — Progress Notes (Signed)
Physical Therapy Evaluation Patient Details Name: Steve [name] MRN: [id number] DOB: 06-02-1933 Today's Date: 11/15/2010  Problem List:  Patient Active Problem List  Diagnoses  . Lumbar radicular pain  . COPD (chronic obstructive pulmonary disease)  . Depression  . Aortic aneurysm  . BPH (benign prostatic hyperplasia)  . Insomnia  . Acute and chronic respiratory failure  . Hypertension    Past Medical History:  Past Medical History  Diagnosis Date  . Hypertension   . Prostate enlargement   . Blood stool     history of-no problems in past 5 years  . History of chicken pox     childhood  . GERD (gastroesophageal reflux disease)   . Arthritis     knee and back  . AAA (abdominal aortic aneurysm)     requested stress test from [place]  . Anxiety    Past Surgical History:  Past Surgical History  Procedure Date  . Knee surgery 1957    left knee arthotomy    PT Assessment/Plan/Recommendation PT Assessment Clinical Impression Statement: Expect patient to progress well on acute, but may need short-term rehab before d/c back to home.  Family made aware. PT Recommendation/Assessment: Patient will need skilled PT in the acute care venue PT Problem List: Decreased strength;Decreased activity tolerance;Decreased mobility;Decreased knowledge of use of DME;Pain;Other (comment);Decreased balance (decreased activity tolerance) Barriers to Discharge: Other (comment) (pt's wife may have difficulty at pt's present level) PT Therapy Diagnosis : Difficulty walking;Generalized weakness;Acute pain PT Plan PT Frequency: Min 3X/week PT Treatment/Interventions: DME instruction;Gait training;Stair training;Functional mobility training;Balance training;Patient/family education PT Recommendation Equipment Recommended: Defer to next venue PT Goals  Acute Rehab PT Goals PT Goal Formulation: With patient/family Time For Goal Achievement: 2 weeks Pt will go Supine/Side to Sit: with  supervision PT Goal: Supine/Side to Sit - Progress: Other (comment) Pt will Transfer Sit to Stand/Stand to Sit: with supervision PT Transfer Goal: Sit to Stand/Stand to Sit - Progress: Other (comment) Pt will Transfer Bed to Chair/Chair to Bed: with supervision PT Transfer Goal: Bed to Chair/Chair to Bed - Progress: Other (comment) Pt will Ambulate: >150 feet;with least restrictive assistive device PT Goal: Ambulate - Progress: Other (comment) Pt will Go Up / Down Stairs: 1-2 stairs;with rail(s);with supervision PT Goal: Up/Down Stairs - Progress: Other (comment)  PT Evaluation Precautions/Restrictions  Precautions Precautions: Other (comment) (Venturi Mask  ) Prior Functioning  Home Living Lives With: Spouse Receives Help From: Family Type of Home: House Home Layout: One level Home Access: Stairs to enter;Ramped entrance Entrance Stairs-Rails: Can reach both Entrance Stairs-Number of Steps: 4 Bathroom Shower/Tub: Walk-in Contractor: Handicapped height Bathroom Accessibility: Yes How Accessible: Accessible via walker Home Adaptive Equipment: Bedside commode/3-in-1;Hand-held shower hose;Shower chair with back;Walker - four wheeled Prior Function Level of Independence: Independent with basic ADLs;Independent with homemaking with ambulation;Independent with transfers Able to Take Stairs?: Yes Driving: Yes Vocation: Retired Financial risk analyst Arousal/Alertness: Lethargic Overall Cognitive Status: Appears within functional limits for tasks assessed Sensation/Coordination Sensation Light Touch: Appears Intact Coordination Gross Motor Movements are Fluid and Coordinated: Yes Fine Motor Movements are Fluid and Coordinated: Yes Extremity Assessment RUE Assessment RUE Assessment: Within Functional Limits LUE Assessment LUE Assessment: Within Functional Limits RLE Assessment RLE Assessment: Within Functional Limits (generally weakened, but function) LLE  Assessment LLE Assessment: Within Functional Limits (genenerally weak, but functional) Mobility (including Balance) Bed Mobility Bed Mobility: No Transfers Transfers: Yes Sit to Stand: 4: Min assist;From chair/3-in-1 Sit to Stand Details (indicate cue type and reason): VC for  hand placement,  pt a little impulsive and initiated stand before cued. Ambulation/Gait Ambulation/Gait: Yes Ambulation/Gait Assistance: 1: +2 Total assist;Patient percentage (comment) (pt=60%) Ambulation/Gait Assistance Details (indicate cue type and reason): VC for postural checks, guidance, efficient breathing technique Ambulation Distance (Feet): 40 Feet Assistive device: Rolling walker;2 person hand held assist Gait Pattern: Shuffle (generally steady)  Balance Balance Assessed: Yes (3 episodes where he listed posterior needing min/mod assist) Exercise    End of Session PT - End of Session Activity Tolerance: Patient tolerated treatment well Patient left: in chair Nurse Communication: Mobility status for ambulation General Behavior During Session: Lethargic Cognition: WFL for tasks performed  Steve [name], Steve [name] 11/15/2010, 12:57 PM

## 2010-11-15 NOTE — Plan of Care (Signed)
Problem: Phase II Discharge Progression Outcomes Goal: Ambulates without assistance Outcome: Progressing Ambulated with assistance 100 ft in hall 11/6 at noon.

## 2010-11-15 NOTE — Op Note (Signed)
NAMEMarland Kitchen  NIL, XIONG NO.:  [id number]  MEDICAL RECORD NO.:  [id number]  LOCATION:  2304                         FACILITY:  [facility]  PHYSICIAN:  Larina Earthly, M.D.    DATE OF BIRTH:  05/02/1933  DATE OF PROCEDURE:  11/14/2010 DATE OF DISCHARGE:                              OPERATIVE REPORT   PREOPERATIVE DIAGNOSIS:  Saccular infrarenal abdominal aortic aneurysm.  POSTOP DIAGNOSIS:  Saccular infrarenal abdominal aortic aneurysm.  PROCEDURE:  Resection of saccular infrarenal aneurysm and repair with an 18-mm Hemashield straight graft.  SURGEON:  Larina Earthly, MD  ASSISTANT:  Della Goo, PA-C  ANESTHESIA:  General endotracheal.  COMPLICATIONS:  None.  DISPOSITION:  Recovery room, stable.  INDICATION FOR THE PROCEDURE:  The patient is a 77 year old gentleman with incidental finding of a saccular aneurysm in the infrarenal position.  This was 4 cm transversely and extended towards the level of the cava.  This had mural thrombus.  I discussed the options with the patient.  He was not a stent graft candidate due to the proximity to the renal arteries, therefore, recommended open repair.  I explained the difference of the fusiform and saccular.  The configuration was quite concerning.  PROCEDURE IN DETAIL:  The patient was taken to operating room and placed in supine position where the abdomen and both groins were prepped and draped in usual sterile fashion.  Incision was made from the level of xiphoid to below the umbilicus and carried down to the midline fascia with electrocautery.  The abdomen was entered and the Balfour retractor was used for exposure.  The transverse colon and omentum reflected superiorly and the small bowel was reflected to the right.  The Omni- Tract retractor was used for exposure.  The retroperitoneum was entered, and the duodenum was mobilized to the right.  The aorta was exposed at the level of the left renal vein, and  dissection was carried above the level of the renal arteries with control of the aorta acceptable above the renal arteries bilaterally.  The renal arteries were also isolated and the infrarenal aorta just below the renal arteries was also exposed. The area of the aneurysm was just below this level.  The patient had extremely calcified aorta and iliac segments.  The common iliac arteries were isolated bilaterally for distal control.  Inferior mesenteric artery was patent and was circled with a red vessel loop in a Potts tie. The patient was given 8000 units of intravenous heparin.  After adequate circulation time, the aorta was occluded with aortic DeBakey clamp just below the level of renal arteries and the common iliac arteries were occluded bilaterally with Henley clamps.  The aorta was transected below the level of the aneurysm.  The patient did have 2 lumbar vessels at the level of this occlusion.  These were ligated and divided.  Mobility was continued up to the level of the proximal clamp.  The aorta was transected below the proximal clamp with enough cuff for anastomosis. Mural thrombus was removed from the aneurysm sac.  The aorta was transected below the level of the aneurysm as well.  An 18-mm Hemashield graft was brought onto the field  and using a felt strip for reinforcement, was sewn end-to-end to the aorta with a running 3-0 Prolene suture.  This anastomosis was tested and found to be adequate. Next, the graft was cut to appropriate length and was sewn end-to-end to the aorta below the level of the aneurysm again with a running 3-0 Prolene suture.  Prior to completion of the anastomosis, the usual flushing maneuvers were undertaken.  The anastomosis was completed.  I felt strip was used for reinforcement and distal anastomosis as well. The clamps were removed slowly and flow was restored to both lower extremities.  After the blood pressure stabilized, the patient was  given 50 mg of protamine reverse heparin.  The wounds were irrigated with saline.  Hemostasis with electrocautery.  Wounds were closed by closing the retroperitoneum over the graft with a running 3-0 Prolene suture. It should be noted that the aneurysm contents were cultured for aerobic and anaerobic, but there was no evidence of pus.  The small bowel was run in its entirety and found to be without injury, placed back into the pelvis.  Transverse colon and omentum were placed over this.  The midline fascia was closed with #1 PDS suture beginning proximally and distally and tying in the middle.  The skin was closed with 3-0 subcuticular Vicryl stitch.  Sterile dressing was applied, and the patient was taken to the recovery room in stable condition.     Larina Earthly, M.D.     TFE/MEDQ  D:  11/14/2010  T:  11/14/2010  Job:  [id number]

## 2010-11-15 NOTE — Significant Event (Signed)
CRITICAL VALUE ALERT  Critical value received:  CO2 59.1  Date of notification:  11/13/2008  Time of notification:  2100  Critical value read back:yes  Nurse who received alert:  June Leap RN  MD notified (1st page):  Early  Time of first page:  2055  MD notified (2nd page):  Time of second page:  Responding MD:  Early  Time MD responded:  2100

## 2010-11-15 NOTE — Significant Event (Signed)
CRITICAL VALUE ALERT  Critical value received:  CKMB 13.8  Date of notification:  11/15/2010   Time of notification:  18:45  Critical value read back:yes  Nurse who received alert:  Dell Ponto   MD notified (1st page):  Dr. Darrick Penna [phone]  Time of first page:  19:00  MD notified (2nd page):  Time of second page:  Responding MD:  Dr. Darrick Penna  Time MD responded:  19:05

## 2010-11-15 NOTE — Progress Notes (Signed)
eLink Physician-Brief Progress Note Patient Name: Steve [name] DOB: 1933/12/30 MRN: [id number]  Date of Service  11/15/2010   HPI/Events of Note   rn concerned about worsening resp distress and nt suction resulted in lot of mucus  Camera exam shows paradoxical respn, lethargy  eICU Interventions  D/w Bedside CCM MD Dr Kendrick Fries who will eval at bedside   Intervention Category Major Interventions: Respiratory failure - evaluation and management  Amirra Herling 11/15/2010, 7:59 PM

## 2010-11-15 NOTE — Progress Notes (Signed)
Occupational Therapy Evaluation Patient Details Name: Steve [name] MRN: [id number] DOB: [birth year] Today's Date: 11/15/2010  Problem List:  Patient Active Problem List  Diagnoses  . Lumbar radicular pain  . COPD (chronic obstructive pulmonary disease)  . Depression  . Aortic aneurysm  . BPH (benign prostatic hyperplasia)  . Insomnia  . Acute and chronic respiratory failure  . Hypertension    Past Medical History:  Past Medical History  Diagnosis Date  . Hypertension   . Prostate enlargement   . Blood stool     history of-no problems in past 5 years  . History of chicken pox     childhood  . GERD (gastroesophageal reflux disease)   . Arthritis     knee and back  . AAA (abdominal aortic aneurysm)     requested stress test from [facility]  . Anxiety    Past Surgical History:  Past Surgical History  Procedure Date  . Knee surgery 1957    left knee arthotomy    OT Assessment/Plan/Recommendation OT Assessment Clinical Impression Statement: Pt. will increase participation with ADLs to decrease burden of care at DC. OT Recommendation/Assessment: Patient will need skilled OT in the acute care venue OT Problem List: Decreased strength;Decreased activity tolerance;Impaired balance (sitting and/or standing);Decreased safety awareness;Decreased knowledge of use of DME or AE;Decreased knowledge of precautions;Cardiopulmonary status limiting activity Barriers to Discharge: Other (comment);Decreased caregiver support (Pt's wife may have difficulty providing physical assist) OT Therapy Diagnosis : Generalized weakness OT Plan OT Frequency: Min 1X/week OT Treatment/Interventions: Self-care/ADL training;DME and/or AE instruction;Therapeutic activities;Patient/family education;Balance training OT Recommendation Follow Up Recommendations: Skilled nursing facility Equipment Recommended: Defer to next venue Individuals Consulted Consulted and Agree with Results and Recommendations:  Patient;Family member/caregiver OT Goals Acute Rehab OT Goals OT Goal Formulation: With patient Time For Goal Achievement: 2 weeks ADL Goals Pt Will Perform Grooming: with supervision;with set-up;Standing at sink ADL Goal: Grooming - Progress: Other (comment) Pt Will Perform Upper Body Bathing: with set-up;Sitting, edge of bed ADL Goal: Upper Body Bathing - Progress: Other (comment) Pt Will Perform Lower Body Bathing: with supervision;Sit to stand from bed ADL Goal: Lower Body Bathing - Progress: Other (comment) Pt Will Perform Upper Body Dressing: with set-up;Sitting, bed;Unsupported ADL Goal: Upper Body Dressing - Progress: Other (comment) Pt Will Perform Lower Body Dressing: with set-up;Sit to stand from bed ADL Goal: Lower Body Dressing - Progress: Other (comment) Pt Will Transfer to Toilet: with supervision;Stand pivot transfer;3-in-1 ADL Goal: Toilet Transfer - Progress: Other (comment) Pt Will Perform Toileting - Hygiene: with set-up ADL Goal: Toileting - Hygiene - Progress: Other (comment)  OT Evaluation Precautions/Restrictions  Precautions Precautions: Other (comment) (Venturi Mask  ) Prior Functioning Home Living Lives With: Spouse Receives Help From: Family Type of Home: House Home Layout: One level Home Access: Stairs to enter;Ramped entrance Entrance Stairs-Rails: Can reach both Entrance Stairs-Number of Steps: 4 Bathroom Shower/Tub: Walk-in Contractor: Handicapped height Bathroom Accessibility: Yes How Accessible: Accessible via walker Home Adaptive Equipment: Bedside commode/3-in-1;Hand-held shower hose;Shower chair with back;Walker - four wheeled Prior Function Level of Independence: Independent with basic ADLs;Independent with homemaking with ambulation;Independent with transfers Able to Take Stairs?: Yes Driving: Yes Vocation: Retired ADL ADL Eating/Feeding: Simulated;Set up Where Assessed - Eating/Feeding: Edge of bed Grooming:  Simulated;Set up;Wash/dry face Where Assessed - Grooming: Sitting, chair Upper Body Bathing: Simulated;Chest;Right arm;Left arm;Abdomen;Minimal assistance Where Assessed - Upper Body Bathing: Sitting, chair Lower Body Bathing: Simulated;Maximal assistance Where Assessed - Lower Body Bathing: Sit to stand from chair Upper  Body Dressing: Simulated;Minimal assistance Where Assessed - Upper Body Dressing: Sit to stand from chair Lower Body Dressing: Simulated;Maximal assistance Where Assessed - Lower Body Dressing: Sit to stand from chair Toilet Transfer: Simulated;Minimal assistance Toilet Transfer Details (indicate cue type and reason): with use of arm rests of chair Toilet Transfer Method: Ambulating Toilet Transfer Equipment: Other (comment) (With use of chair to simulate 3-in-1) Toileting - Clothing Manipulation: Not assessed Where Assessed - Toileting Clothing Manipulation: Not assessed Toileting - Hygiene: Not assessed Where Assessed - Toileting Hygiene: Not assessed Tub/Shower Transfer: Not assessed Tub/Shower Transfer Method: Not assessed Equipment Used: Rolling walker ADL Comments: Pt with decreased arousal today which limited pt. participation in ADLs. Pt. with posterior LOB in standing x3 and also when ambulating due to lethargy and pt. provided with assist to correct balance and maintain hold of the RW. Pt. ambulated ~40' with total assist +2 pt.=60%. Vision/Perception  Vision - Assessment Eye Alignment: Within Functional Limits Vision Assessment: Vision not tested Cognition Cognition Arousal/Alertness: Lethargic Overall Cognitive Status: Appears within functional limits for tasks assessed Sensation/Coordination Sensation Light Touch: Appears Intact Coordination Gross Motor Movements are Fluid and Coordinated: Yes Fine Motor Movements are Fluid and Coordinated: Yes Extremity Assessment RUE Assessment RUE Assessment: Within Functional Limits LUE Assessment LUE  Assessment: Within Functional Limits Mobility  Bed Mobility Bed Mobility: No Transfers Transfers: Yes Sit to Stand: 4: Min assist;From chair/3-in-1 Sit to Stand Details (indicate cue type and reason): VC for hand placement,  pt a little impulsive and initiated stand before cued. Exercises   End of Session OT - End of Session Equipment Utilized During Treatment: Gait belt Activity Tolerance: Patient limited by fatigue Patient left: in chair;with call bell in reach;with family/visitor present Nurse Communication: Mobility status for transfers;Mobility status for ambulation General Behavior During Session: Lethargic Cognition: WFL for tasks performed  Co-tx with P.T.   Chezney Huether, OTR/L 11/15/2010, 12:52 PM

## 2010-11-15 NOTE — Progress Notes (Signed)
[facility]  Post-op  OPEN AAA Repair Date of Surgery: 11/14/10 POD # 1 Surgeon: Milagros Reap  History of Present Illness  Steve [name] is a 77 y.o. male who is  up s/p Open repair of Abdominal Aortic Aneurysm . Pt is doing well. complains of incisional pain; denies nausea/vomiting; denies diarrhea. has not had flatus;has not had BM   labs per chart  I/O last 3 completed shifts: In: 6090 [I.V.:4850; Blood:110; NG/GT:30; IV Piggyback:1100] Out: 2010 [Urine:1210; Emesis/NG output:300; Blood:500]    NGT Drainage:330cc  Physical Examination Patient Vitals for the past 24 hrs:  BP Temp Temp src Pulse Resp SpO2 Height Weight  11/15/10 0600 109/49 mmHg - - 70  20  93 % - -  11/15/10 0500 131/62 mmHg - - 73  23  93 % - 210 lb 8.6 oz (95.5 kg)  11/15/10 0402 - - - - - 94 % - -  11/15/10 0400 105/55 mmHg 99.3 F (37.4 C) Core 68  19  94 % - -  11/15/10 0300 94/58 mmHg - - 67  20  92 % - -  11/15/10 0230 116/49 mmHg - - 67  22  96 % - -  11/15/10 0200 105/56 mmHg - - 65  14  98 % - -  11/15/10 0100 101/55 mmHg - - 66  18  96 % - -  11/15/10 0015 - - - 67  17  98 % - -  11/15/10 0000 118/60 mmHg 99 F (37.2 C) Core 68  17  97 % - -  11/14/10 2352 - - - - - 96 % - -  11/14/10 2347 138/62 mmHg - - 65  26  97 % - -  11/14/10 2300 114/65 mmHg - - 64  15  96 % - -  11/14/10 2200 110/63 mmHg - - 62  16  96 % - -  11/14/10 2100 147/55 mmHg - - 64  28  98 % - -  11/14/10 2030 147/55 mmHg - - 64  28  98 % - -  11/14/10 2000 114/66 mmHg 98.2 F (36.8 C) Core 59  15  97 % - -  11/14/10 1906 134/54 mmHg - - 59  16  95 % - -  11/14/10 1900 127/57 mmHg - - 75  16  95 % - -  11/14/10 1845 - - - 75  17  95 % - -  11/14/10 1830 - - - 73  16  95 % - -  11/14/10 1815 - - - 72  18  96 % - -  11/14/10 1800 119/66 mmHg - - 69  17  96 % - -  11/14/10 1745 - - - 70  15  95 % - -  11/14/10 1730 - - - 66  15  94 % - -  11/14/10 1715 - - - 76  14  93 % - -  11/14/10 1700  117/69 mmHg - - 76  14  94 % - -  11/14/10 1645 - - - 79  15  90 % - -  11/14/10 1630 - - - 77  15  96 % - -  11/14/10 1615 - - - 77  16  97 % - -  11/14/10 1600 102/57 mmHg - - 78  18  98 % - -  11/14/10 1545 - - - 83  18  93 % - -  11/14/10 1530 - - - 76  20  97 % 5\' 9"  (1.753 m) 200 lb (90.719 kg)  11/14/10 1500 111/61 mmHg - - 77  13  94 % - -  11/14/10 1454 - 97.1 F (36.2 C) Oral - - - - -  11/14/10 1445 - - - 77  13  92 % - -  11/14/10 1430 - - - 73  14  93 % - -  11/14/10 1415 - - - 78  13  94 % - -  11/14/10 1400 - - - 75  11  96 % - -  11/14/10 1345 - - - 71  12  93 % - -  11/14/10 1330 - - - 70  14  98 % - -  11/14/10 1315 - - - 67  13  97 % - -  11/14/10 1300 - - - 67  15  96 % - -  11/14/10 1250 - - - 71  - 96 % - -  11/14/10 1245 - - - 74  15  93 % - -  11/14/10 1231 133/78 mmHg - - - - - - -  11/14/10 1230 - - - 77  16  95 % - -  11/14/10 1221 151/53 mmHg 97 F (36.1 C) - 77  16  - - -    O2 Sats 92 % on Non-rebreather mask  General: A&O x 3, WDWN male in NAD Pulmonary: normal non-labored breathing , without Rales, rhonchi,  wheezing Cardiac: Heart rate : regular , without  Murmurs,  Abdomen:abdomen soft and positive findings: absent bowel sounds Abdominal wound:clean, dry, intact with min old bloody drainage  Neurologic: A&O X 3; Appropriate Affect ; SENSATION: normal; MOTOR FUNCTION:  moving all extremities equally. Speech is fluent/normal  Vascular Exam:BLE warm and well perfused Extremities without ischemic changes, no Gangrene , no cellulitis; no open wounds;   LOWER EXTREMITY PULSES           RIGHT                                      LEFT      POSTERIOR TIBIAL palpable palpable       DORSALIS PEDIS      ANTERIOR TIBIAL palpable absent   Assessment: Steve [name] is a 77 y.o. male who is 1 days s/p  Open repair of Abdominal Aortic Aneurysm . POST -op anemia stable COPD on 50% FM - uses O2 at home  Plan: D/C Ernestine Conrad, A-Line  Albuteral Reita May  inhalers OOB PT/OT

## 2010-11-15 NOTE — Telephone Encounter (Signed)
Typically used to stabilize dropping bp upon rising. Have her wait and see if they discharge him on it since they are monitoring his bp and electrolytes. If at home bp's ok then can attempt trial off with continued bp monitoring.

## 2010-11-15 NOTE — Progress Notes (Signed)
Follow up - Critical Care Medicine Note  Patient Details:    Steve [name] is an 77 y.o. male.  Brief history 77 y/o male with a AAA and COPD underwent an open AAA repair today. PCCM consulted for post operative respiratory acidosis and hypoxemia.  Lines/tubes:  11/14/10 SGC >>  11/14/10 L radial a line>>  Microbiology/Sepsis markers:  Anti-infectives:  Cefuroxime (post op) >>  Best Practice/Protocols:  SCD's Pepcid Consults:  Vascular SGY Studies/events:  11/14/10 Open AAA repair  Subjective:    Overnight Issues:  Required BiPAP last pm, ABG this am with improved but persistent resp acidosis  Pain control changed to morphine PCA this am.  NPO -> changing to ice chips  Objective:  Vital signs  Temp:  [97 F (36.1 C)-99.3 F (37.4 C)] 99.3 F (37.4 C) (11/06 0400) Pulse Rate:  [59-83] 74  (11/06 0900) Resp:  [11-28] 13  (11/06 0900) BP: (94-168)/(49-78) 105/54 mmHg (11/06 0900) SpO2:  [90 %-98 %] 92 % (11/06 0900) Arterial Line BP: (94-152)/(44-90) 132/49 mmHg (11/06 0700) FiO2 (%):  [50 %-80 %] 50 % (11/06 0900) Weight:  [90.719 kg (200 lb)-95.5 kg (210 lb 8.6 oz)] 210 lb 8.6 oz (95.5 kg) (11/06 0500)  Hemodynamic parameters for last 24 hours: PAP: (37-57)/(14-31) 51/22 mmHg CO:  [5.7 L/min-10.3 L/min] 10.3 L/min CI:  [2.8 L/min/m2-5 L/min/m2] 5 L/min/m2   Intake/Output Summary (Last 24 hours) at 11/15/10 0949 Last data filed at 11/15/10 0700  Gross per 24 hour  Intake   3715 ml  Output   1980 ml  Net   1735 ml    Physical Exam:   General: chronically ill appearing, NAD HEENT: mm moist, NGT in place Neuro: awake and interacting RASS= 0 Lungs: resps even non labored Cards: s1s2 rrr, no m/r/g Abd: soft, round, non tender, hypoactive BS Ext: warm and dry, no edema, SCD in place   Ventilator  Settings: Vent Mode:  [-]  FiO2 (%):  [50 %-80 %] 50 %  LAB RESULTS BMET    Component Value Date/Time   NA 143 11/15/2010 0415   K 4.2 11/15/2010 0415   CL  111 11/15/2010 0415   CO2 25 11/15/2010 0415   GLUCOSE 119* 11/15/2010 0415   BUN 20 11/15/2010 0415   CREATININE 1.06 11/15/2010 0415   CREATININE 1.16 09/16/2010 1514   CALCIUM 8.3* 11/15/2010 0415   GFRNONAA 67* 11/15/2010 0415   GFRAA 77* 11/15/2010 0415   CBC    Component Value Date/Time   WBC 17.3* 11/15/2010 0415   RBC 3.14* 11/15/2010 0415   HGB 10.2* 11/15/2010 0415   HCT 30.8* 11/15/2010 0415   PLT 120* 11/15/2010 0415   MCV 98.1 11/15/2010 0415   MCH 32.5 11/15/2010 0415   MCHC 33.1 11/15/2010 0415   RDW 14.5 11/15/2010 0415   LYMPHSABS 1.6 09/06/2010 2101   MONOABS 0.7 09/06/2010 2101   EOSABS 0.2 09/06/2010 2101   BASOSABS 0.0 09/06/2010 2101   ABG    Component Value Date/Time   PHART 7.301* 11/15/2010 0429   HCO3 25.5* 11/15/2010 0429   TCO2 27 11/15/2010 0429   ACIDBASEDEF 1.0 11/15/2010 0429   O2SAT 91.0 11/15/2010 0429    Additional lab data  Radiology CXR 11/15/10: Improvement in bilateral infiltrates/atx compared with 11/14/10  Assessment/Plan:   1.  Aortic aneurysm, s/p surgical repair 11/5  - follow CBC  - OOB, PT/OT, mobility  - Art line and PA-C out today  2. Acute and chronic respiratory failure, due to  poor abdominal compliance and narcotics superimposed on known COPD.   - BiPAP prn  - pulm toilet  - agree with change to morphine PCA to avoid boluses of higher doses.  - ABG in am 11/7  3. COPD (chronic obstructive pulmonary disease). Was only on prn albuterol at home  - change albuterol to scheduled duonebs + prn alb  4. HTN. BP at goal at this time.   - scheduled metoprolol IV ordered  - convert back to home norvasc once able to take PO   5. Depression/anxiety  - restart home celexa when able to take PO   - was on xanax at home, watch for withdrawal sx     LOS: 1 day   Critical Care Total Time*: 30 Minutes  BYRUM,ROBERT S. 11/15/2010  *Care during the described time interval was provided by me and/or other providers on the critical care team.  I  have reviewed this patient's available data, including medical history, events of note, physical examination and test results as part of my evaluation.

## 2010-11-15 NOTE — Progress Notes (Signed)
Subjective: Interval History: has complaints abd soreness..   Objective: Vital signs in last 24 hours: Temp:  [97 F (36.1 C)-99.3 F (37.4 C)] 99.3 F (37.4 C) (11/06 0400) Pulse Rate:  [59-83] 70  (11/06 0600) Resp:  [11-28] 20  (11/06 0600) BP: (94-151)/(49-78) 109/49 mmHg (11/06 0600) SpO2:  [90 %-98 %] 93 % (11/06 0600) Arterial Line BP: (94-152)/(44-90) 133/48 mmHg (11/06 0600) FiO2 (%):  [50 %-80 %] 50 % (11/06 0600) Weight:  [200 lb (90.719 kg)-210 lb 8.6 oz (95.5 kg)] 210 lb 8.6 oz (95.5 kg) (11/06 0500)  Intake/Output from previous day: 11/05 0701 - 11/06 0700 In: 6090 [I.V.:4850; Blood:110; NG/GT:30; IV Piggyback:1100] Out: 2010 [Urine:1210; Emesis/NG output:300; Blood:500] Intake/Output this shift: Total I/O In: 1505 [I.V.:1375; NG/GT:30; IV Piggyback:100] Out: 755 [Urine:455; Emesis/NG output:300]  GI: abd tenderness,mild Ext:3+ popliteal pulse bilat Chest; nonlabored breathing  Lab Results:  [facility] 11/15/10 0415 11/14/10 1410  WBC 17.3* 16.4*  HGB 10.2* 10.4*  HCT 30.8* 31.4*  PLT 120* 141*   BMET  Basename 11/15/10 0415 11/14/10 1410  NA 143 142  K 4.2 3.8  CL 111 109  CO2 25 26  GLUCOSE 119* 130*  BUN 20 18  CREATININE 1.06 1.04  CALCIUM 8.3* 8.2*    Studies/Results: Dg Chest 2 View  11/10/2010  *RADIOLOGY REPORT*  Clinical Data: Preop for repair of abdominal aortic aneurysm  CHEST - 2 VIEW  Comparison: Chest x-ray of 09/10/2010  Findings: The lungs are clear.  Mediastinal contours are stable. The heart is within upper limits of normal.  No acute bony abnormality is seen.  IMPRESSION: Stable chest x-ray.  No active lung disease.  Original Report Authenticated By: Juline Patch, M.D.   Dg Chest Portable 1 View  11/15/2010  *RADIOLOGY REPORT*  Clinical Data: Short of breath  PORTABLE CHEST - 1 VIEW  Comparison: 1340 hours  Findings: Stable Swan-Ganz catheter and NG tube.  Vascular congestion and hazy edema has worsened.  Low volumes and  bibasilar atelectasis.  No pneumothorax.  IMPRESSION: Hazy pulmonary edema has worsened.  Stable bibasilar atelectasis.  Original Report Authenticated By: Donavan Burnet, M.D.   Dg Chest Portable 1 View  11/14/2010  *RADIOLOGY REPORT*  Clinical Data: Swan-Ganz catheter placement.  Abdominal aortic aneurysm stent placement.  PORTABLE CHEST - 1 VIEW  Comparison: 11/10/2010  Findings: NG tube tip is below the diaphragm.  Tip of the Swan-Ganz catheter is in the right main pulmonary artery.  Heart size and vascularity are normal.  Slight atelectasis at the lung bases.  No pneumothorax.  IMPRESSION:  Swan-Ganz catheter and NG tube in place.  Mild bibasilar atelectasis.  Original Report Authenticated By: Gwynn Burly, M.D.   Anti-infectives: Anti-infectives     Start     Dose/Rate Route Frequency Ordered Stop   11/13/10 1415   cefUROXime (ZINACEF) 1.5 g in dextrose 5 % 50 mL IVPB  Status:  Discontinued        1.5 g 100 mL/hr over 30 Minutes Intravenous 60 min pre-op 11/13/10 1413 11/14/10 1201          Assessment/Plan: s/p Procedure(s): ANEURYSM ABDOMINAL AORTIC REPAIR dc ng tube, pulm toilet, oob to chair   LOS: 1 day   Steve [name] F 11/15/2010, 6:45 AM

## 2010-11-15 NOTE — Progress Notes (Signed)
eLink Physician-Brief Progress Note Patient Name: Steve [name] DOB: 1933-10-14 MRN: [id number]  Date of Service  11/15/2010   HPI/Events of Note   Et tube is in right main  eICU Interventions  Reposition et tube and repeat cxr   Intervention Category Major Interventions: Respiratory failure - evaluation and management Intermediate Interventions: Diagnostic test evaluation  Lateesha Bezold 11/15/2010, 9:11 PM

## 2010-11-15 NOTE — Progress Notes (Signed)
  Intubation Procedure Note Steve [name] [id number] 1933-10-20  Procedure: Intubation Indications: Airway protection and maintenance  Procedure Details Consent: Risks of procedure as well as the alternatives and risks of each were explained to the (patient/caregiver).  Consent for procedure obtained. and Unable to obtain consent because of altered level of consciousness. Time Out: Verified patient identification, verified procedure, site/side was marked, verified correct patient position, special equipment/implants available, medications/allergies/relevent history reviewed, required imaging and test results available.  Performed  MAC and 4, DLx1, Grade I view, 7.5 ETT placed, 27cm at lip  Drugs: 2mg  versed, Fentanyl, 10mg  Etomidate  Evaluation Hemodynamic Status: BP stable throughout; O2 sats: stable throughout Patient's Current Condition: stable Complications: No apparent complications Patient did tolerate procedure well. Chest X-ray ordered to verify placement.  CXR: pending.   Steve [name] 11/15/2010

## 2010-11-15 NOTE — Progress Notes (Signed)
eLink Physician-Brief Progress Note Patient Name: Steve [name] DOB: 1933-11-14 MRN: [id number]  Date of Service  11/15/2010   HPI/Events of Note   Post intubation is hypotensive. Checked with RN and RR on vent is 15 but no vent dysynchrony. Patient got lopressors and is is on other anti bp meds  eICU Interventions  fluiid bolus Dc bp meds monitor   Intervention Category Major Interventions: Respiratory failure - evaluation and management Intermediate Interventions: Diagnostic test evaluation  [name] 11/15/2010, 9:31 PM

## 2010-11-16 ENCOUNTER — Inpatient Hospital Stay (HOSPITAL_COMMUNITY): Payer: Medicare HMO

## 2010-11-16 DIAGNOSIS — I517 Cardiomegaly: Secondary | ICD-10-CM

## 2010-11-16 LAB — POCT I-STAT 3, ART BLOOD GAS (G3+)
Acid-base deficit: 2 mmol/L (ref 0.0–2.0)
Patient temperature: 98.9
pH, Arterial: 7.36 (ref 7.350–7.450)

## 2010-11-16 LAB — BASIC METABOLIC PANEL
BUN: 21 mg/dL (ref 6–23)
Chloride: 115 mEq/L — ABNORMAL HIGH (ref 96–112)
Glucose, Bld: 116 mg/dL — ABNORMAL HIGH (ref 70–99)
Potassium: 4.3 mEq/L (ref 3.5–5.1)

## 2010-11-16 LAB — CBC
HCT: 29.2 % — ABNORMAL LOW (ref 39.0–52.0)
Hemoglobin: 9.4 g/dL — ABNORMAL LOW (ref 13.0–17.0)
MCHC: 32.2 g/dL (ref 30.0–36.0)
WBC: 16 10*3/uL — ABNORMAL HIGH (ref 4.0–10.5)

## 2010-11-16 MED ORDER — IPRATROPIUM-ALBUTEROL 18-103 MCG/ACT IN AERO
6.0000 | INHALATION_SPRAY | Freq: Four times a day (QID) | RESPIRATORY_TRACT | Status: DC
  Administered 2010-11-16 – 2010-11-17 (×4): 6 via RESPIRATORY_TRACT
  Filled 2010-11-16: qty 14.7

## 2010-11-16 MED ORDER — ALBUTEROL SULFATE HFA 108 (90 BASE) MCG/ACT IN AERS
6.0000 | INHALATION_SPRAY | RESPIRATORY_TRACT | Status: DC | PRN
  Filled 2010-11-16: qty 6.7

## 2010-11-16 MED ORDER — CHLORHEXIDINE GLUCONATE 0.12 % MT SOLN
OROMUCOSAL | Status: AC
  Filled 2010-11-16: qty 15

## 2010-11-16 MED ORDER — FUROSEMIDE 10 MG/ML IJ SOLN
40.0000 mg | Freq: Once | INTRAMUSCULAR | Status: AC
  Administered 2010-11-16: 40 mg via INTRAVENOUS
  Filled 2010-11-16: qty 4

## 2010-11-16 MED ORDER — CHLORHEXIDINE GLUCONATE 0.12 % MT SOLN
15.0000 mL | Freq: Two times a day (BID) | OROMUCOSAL | Status: DC
  Administered 2010-11-16: 15 mL via OROMUCOSAL
  Administered 2010-11-16: 01:00:00 via OROMUCOSAL
  Administered 2010-11-17 – 2010-11-20 (×6): 15 mL via OROMUCOSAL
  Filled 2010-11-16 (×17): qty 15

## 2010-11-16 MED ORDER — BIOTENE DRY MOUTH MT LIQD
15.0000 mL | Freq: Four times a day (QID) | OROMUCOSAL | Status: DC
  Administered 2010-11-16 – 2010-11-21 (×16): 15 mL via OROMUCOSAL

## 2010-11-16 MED ORDER — MIDAZOLAM HCL 2 MG/2ML IJ SOLN
INTRAMUSCULAR | Status: AC
  Administered 2010-11-16: 2 mg via INTRAVENOUS
  Filled 2010-11-16: qty 2

## 2010-11-16 NOTE — Progress Notes (Signed)
agree

## 2010-11-16 NOTE — Progress Notes (Addendum)
[facility] [place]  Post-op OPEN AAA Repair  Date of Surgery: 11/14/10  POD # 2  Surgeon: Milagros Reap  History of Present Illness  Steve [name] is a 77 y.o. male who is up s/p Open repair of Abdominal Aortic Aneurysm .  Pt was doing well until 8pm last evening when his breathing be came labored, sats dropped and he was re-intubated. Otherwise he was stable.He has not had BM yet and now has an OG tube in.   I/O last 3 completed shifts: In: 6387.5 [I.V.:4157.5; NG/GT:30; IV Piggyback:2200] Out: 2360 [Urine:1660; Emesis/NG output:700]   NGT Drainage: 700cc    Labs: H/H 9.4/29.2 BMET Na+ 144, K+ 4.3, Bun 21, CR 1.05 BNP 2380 ABG's 7.36/40.7/70/23/  93 %  Physical Examination Patient Vitals for the past 24 hrs:  BP Temp Temp src Pulse Resp SpO2 Weight  11/16/10 0600 124/50 mmHg - - - 16  95 % 221 lb 5.5 oz (100.4 kg)  11/16/10 0500 - - - 72  11  94 % -  11/16/10 0445 104/53 mmHg - - 62  18  96 % -  11/16/10 0400 101/48 mmHg 98.9 F (37.2 C) Oral 64  15  96 % -  11/16/10 0300 106/46 mmHg - - 62  15  98 % -  11/16/10 0239 - - - - - 99 % -  11/16/10 0200 95/46 mmHg - - 60  15  95 % -  11/16/10 0100 96/47 mmHg - - 62  9  96 % -  11/16/10 0030 - - - 62  15  96 % -  11/16/10 0000 96/45 mmHg 99.5 F (37.5 C) Oral 64  15  97 % -  11/15/10 2330 - - - 65  15  98 % -  11/15/10 2300 115/50 mmHg - - 70  15  96 % -  11/15/10 2230 98/45 mmHg - - 63  15  96 % -  11/15/10 2200 95/45 mmHg - - 65  15  97 % -  11/15/10 2145 - - - 64  15  96 % -  11/15/10 2130 79/38 mmHg - - 65  15  95 % -  11/15/10 2115 97/44 mmHg - - 71  19  94 % -  11/15/10 2100 79/36 mmHg - - 67  12  95 % -  11/15/10 2045 94/49 mmHg - - 69  16  93 % -  11/15/10 2035 79/36 mmHg - - 64  - 95 % -  11/15/10 2000 115/60 mmHg 99.1 F (37.3 C) Oral 79  19  92 % -  11/15/10 1940 - - - - - 94 % -  11/15/10 1754 127/55 mmHg - - 96  25  94 % -  11/15/10 1600 - 99.1 F (37.3 C) Oral - - - -  11/15/10 1500  104/86 mmHg - - 79  20  90 % -  11/15/10 1426 - - - - - 94 % -  11/15/10 1400 106/75 mmHg - - 68  17  93 % -  11/15/10 1300 109/59 mmHg - - 68  19  91 % -  11/15/10 1230 108/58 mmHg - - 67  16  89 % -  11/15/10 1202 - - - - 20  92 % -  11/15/10 1200 99/44 mmHg 97.8 F (36.6 C) Oral 70  13  93 % -  11/15/10 1130 100/47 mmHg - - - - - -  11/15/10 1100 100/51 mmHg - -  69  19  91 % -  11/15/10 1030 96/50 mmHg - - 73  19  91 % -  11/15/10 1000 100/54 mmHg - - 74  17  92 % -  11/15/10 0930 101/48 mmHg - - 73  18  93 % -  11/15/10 0900 105/54 mmHg - - 74  13  92 % -  11/15/10 0830 112/51 mmHg - - 77  22  89 % -  11/15/10 0820 124/61 mmHg - - 77  20  92 % -  11/15/10 0810 123/55 mmHg - - 77  25  93 % -  11/15/10 0805 - - - - 25  93 % -  11/15/10 0800 123/55 mmHg 99.9 F (37.7 C) Core 76  17  93 % -  11/15/10 0730 - - - 73  22  92 % -  11/15/10 0729 168/59 mmHg - - 71  - 95 % -    O2 Sats 94 % on 40% O2 on vent  General: A&O x 3, WDWN male in NAD, sedated Pulmonary: normal non-labored breathing , without Rales, rhonchi,  wheezing Cardiac: Heart rate : regular , without  Murmurs,  Abdomen:abdomen soft Abdominal wound:clean, dry, intact  Neurologic: A&O X 3; Appropriate Affect ; SENSATION: normal; MOTOR FUNCTION:  moving all extremities equally. Speech is fluent/normal  Vascular Exam:BLE warm and well perfused Extremities without ischemic changes, no Gangrene , no cellulitis; no open wounds;   LOWER EXTREMITY PULSES           RIGHT                                      LEFT      POSTERIOR TIBIAL palpable palpable       DORSALIS PEDIS      ANTERIOR TIBIAL palpable doppler   Assessment: Steve [name] is a 77 y.o. male who is 2 days s/p  Open repair of Abdominal Aortic Aneurysm . Acute Resp failure post-op in pt with severe COPD  Plan: Vent per CCM CXR: Pulm Edema will give lasix today Cont IVF  Repeat labs in am  ROCZNIAK,REGINA J 11/16/2010 7:24 AM    I have reviewed and  agree with above.  Larina Earthly, MD 11/16/2010 10:56 AM

## 2010-11-16 NOTE — Progress Notes (Signed)
  Echocardiogram 2D Echocardiogram has been performed.  Jeryl Columbia R 11/16/2010, 12:16 PM

## 2010-11-16 NOTE — Plan of Care (Signed)
Problem: Phase I Progression Outcomes Goal: VTE prophylaxis Outcome: Completed/Met Date Met:  11/16/10 scds Goal: Sedation Protocol initiated if indicated Outcome: Completed/Met Date Met:  11/16/10 precedex

## 2010-11-16 NOTE — Progress Notes (Signed)
7 mL of morphine pca wasted into sink.  Witnessed by Burna Cash RN

## 2010-11-16 NOTE — Significant Event (Signed)
Pt BP dropped to 87/38 post intubated, MD notified and new orders received.

## 2010-11-16 NOTE — Progress Notes (Signed)
Follow up - Critical Care Medicine Note  Patient Details:    Steve [name] is an 77 y.o. male. Brief history 77 y/o male with a AAA and COPD underwent an open AAA repair 11/14/10. PCCM consulted for post operative respiratory acidosis and hypoxemia. Reintubated for progressive resp failure and poor secretion management Lines/tubes:  11/14/10 PA-C >> 11/6 11/14/10 R IJ cordis >>  11/14/10 L radial a line>> 11/6 Microbiology/Sepsis markers:  Sputum 11/6 >> Anti-infectives:  Cefuroxime (post op) >>  Best Practice/Protocols:  SCD's  Pepcid  Consults:  Vascular SGY  Studies/events:  11/14/10 Open AAA repair  Subjective:    Overnight Issues: Reintubated 11/6 pm for progressive agitation and poor secretion management. Was hypotensive as well after metoprolol (stopped). Received 2L total IVF bolus. This am followed commands on WUA, had some agitation and received bolused sedation. Currently on precedex.   Objective:  Vital signs  Temp:  [97.8 F (36.6 C)-99.5 F (37.5 C)] 98.9 F (37.2 C) (11/07 0400) Pulse Rate:  [60-96] 67  (11/07 0812) Resp:  [9-25] 16  (11/07 0700) BP: (79-130)/(36-86) 130/60 mmHg (11/07 0812) SpO2:  [89 %-99 %] 93 % (11/07 0700) FiO2 (%):  [40 %-50.4 %] 40 % (11/07 0813) Weight:  [100.4 kg (221 lb 5.5 oz)] 221 lb 5.5 oz (100.4 kg) (11/07 0600)  Hemodynamic parameters for last 24 hours:     Intake/Output Summary (Last 24 hours) at 11/16/10 0839 Last data filed at 11/16/10 0700  Gross per 24 hour  Intake 4867.49 ml  Output   1475 ml  Net 3392.49 ml    Physical Exam: General: chronically ill appearing, sedated HEENT: mm moist, OGT in place, ETT OK Neuro: sedated, wakes to stim, some agitation, RASS -2 Lungs: resps even non labored, coarse BS without wheeze Cards: s1s2 rrr, no m/r/g  Abd: soft, round, non tender, hypoactive BS  Ext: warm and dry, no edema, SCD in place  Ventilator  Settings: Vent Mode:  [-] PRVC FiO2 (%):  [40 %-50.4 %] 40 % Vt  Set:  [550 mL] 550 mL PEEP:  [5 cmH20] 5 cmH20 Plateau Pressure:  [15 cmH20-18 cmH20] 15 cmH20  LAB RESULTS Lab Results  Component Value Date   NA 144 11/16/2010   K 4.3 11/16/2010   CL 115* 11/16/2010   CO2 24 11/16/2010   Lab Results  Component Value Date   WBC 16.0* 11/16/2010   HGB 9.4* 11/16/2010   HCT 29.2* 11/16/2010   MCV 100.0 11/16/2010   PLT 86* 11/16/2010   ABG    Component Value Date/Time   PHART 7.360 11/16/2010 0501   HCO3 23.0 11/16/2010 0501   TCO2 24 11/16/2010 0501   ACIDBASEDEF 2.0 11/16/2010 0501   O2SAT 93.0 11/16/2010 0501    Additional lab data  Radiology CXR: ETT in place. Bilateral atx and crowding, interstitial prominence, ? Edema pattern    Assessment/Plan:   LOS: 2 days   Impression/Plans:  1. Aortic aneurysm, s/p surgical repair 11/5  - follow CB - Art line and PA-C out - PT/OT on hold s/p reintubation - scheduled for cefuroxime post-op (x 1 more day) - prn fentanyl for pain control 2. Acute and chronic respiratory failure, due to poor abdominal compliance, narcotics, superimposed on known COPD.  - no plans for extubation at this time due to secretions. Will continue light sedation and plan for WUA and hopefully SBT in am 11/8 - change nebs to combivent per HFA + prn ventolin - mild pulm edema pattern on  CXR. Agree with gentle diuresis (lasix 40mg  given) 11/7, follow CXR. At risk for ALI vs possible PNA. Will watch fever curve, particularly as post-op cefuroxime ends.  - ABG in am 11/8 3. COPD (chronic obstructive pulmonary disease). Was only on prn albuterol at home  - change to combivent as above 4. HTN. BP at goal at this time.  - metoprolol IV d/c'd 11/6 - convert back to home norvasc once extubated, able to take PO  5. Depression/anxiety  - restart home celexa when able to take PO  - was on xanax at home, watch for withdrawal sx once sedation lifted   Additional comments:I discussed the patient's status, plans with Dr. Arbie Cookey, Vascular  Surgery.  Critical Care Total Time*: 30 Minutes  Tykeshia Tourangeau S. 11/16/2010  *Care during the described time interval was provided by me and/or other providers on the critical care team.  I have reviewed this patient's available data, including medical history, events of note, physical examination and test results as part of my evaluation.

## 2010-11-16 NOTE — Telephone Encounter (Signed)
Call returned to patient at [phone], no answer. A detailed voice message was left informing patient/wife [name] per Dr Rodena Medin instructions. She was advised to call back if any questions.

## 2010-11-17 ENCOUNTER — Inpatient Hospital Stay (HOSPITAL_COMMUNITY): Payer: Medicare HMO

## 2010-11-17 DIAGNOSIS — J449 Chronic obstructive pulmonary disease, unspecified: Secondary | ICD-10-CM

## 2010-11-17 DIAGNOSIS — J9600 Acute respiratory failure, unspecified whether with hypoxia or hypercapnia: Secondary | ICD-10-CM

## 2010-11-17 DIAGNOSIS — I714 Abdominal aortic aneurysm, without rupture: Secondary | ICD-10-CM

## 2010-11-17 DIAGNOSIS — R0902 Hypoxemia: Secondary | ICD-10-CM

## 2010-11-17 LAB — BASIC METABOLIC PANEL
Chloride: 108 mEq/L (ref 96–112)
GFR calc Af Amer: >90 mL/min (ref >90)
GFR calc non Af Amer: 80 mL/min — ABNORMAL LOW (ref >90)
Potassium: 3.6 mEq/L (ref 3.5–5.1)

## 2010-11-17 LAB — POCT I-STAT 3, ART BLOOD GAS (G3+)
Bicarbonate: 26.7 mEq/L — ABNORMAL HIGH (ref 20.0–24.0)
O2 Saturation: 96 %
pCO2 arterial: 40.7 mmHg (ref 35.0–45.0)
pO2, Arterial: 79 mmHg — ABNORMAL LOW (ref 80.0–100.0)

## 2010-11-17 LAB — CBC
MCHC: 32.1 g/dL (ref 30.0–36.0)
Platelets: 91 10*3/uL — ABNORMAL LOW (ref 150–400)
RDW: 14.4 % (ref 11.5–15.5)
WBC: 13.6 10*3/uL — ABNORMAL HIGH (ref 4.0–10.5)

## 2010-11-17 LAB — BODY FLUID CULTURE: Culture: NO GROWTH

## 2010-11-17 MED ORDER — IPRATROPIUM BROMIDE 0.02 % IN SOLN
0.5000 mg | Freq: Four times a day (QID) | RESPIRATORY_TRACT | Status: DC
  Administered 2010-11-17 – 2010-11-18 (×3): 0.5 mg via RESPIRATORY_TRACT
  Filled 2010-11-17 (×3): qty 2.5

## 2010-11-17 MED ORDER — FUROSEMIDE 10 MG/ML IJ SOLN
INTRAMUSCULAR | Status: AC
  Administered 2010-11-17: 20 mg via INTRAVENOUS
  Filled 2010-11-17: qty 4

## 2010-11-17 MED ORDER — FENTANYL CITRATE 0.05 MG/ML IJ SOLN
12.5000 ug | INTRAMUSCULAR | Status: DC | PRN
  Administered 2010-11-17 (×2): 25 ug via INTRAVENOUS
  Filled 2010-11-17 (×2): qty 2

## 2010-11-17 MED ORDER — FUROSEMIDE 10 MG/ML IJ SOLN
20.0000 mg | Freq: Once | INTRAMUSCULAR | Status: AC
  Administered 2010-11-17: 20 mg via INTRAVENOUS

## 2010-11-17 MED ORDER — POTASSIUM CHLORIDE 10 MEQ/100ML IV SOLN
10.0000 meq | INTRAVENOUS | Status: AC
  Administered 2010-11-17 (×2): 10 meq via INTRAVENOUS

## 2010-11-17 MED ORDER — ALBUTEROL SULFATE (5 MG/ML) 0.5% IN NEBU
2.5000 mg | INHALATION_SOLUTION | Freq: Four times a day (QID) | RESPIRATORY_TRACT | Status: DC
  Administered 2010-11-17 – 2010-11-18 (×3): 2.5 mg via RESPIRATORY_TRACT
  Filled 2010-11-17 (×3): qty 0.5

## 2010-11-17 MED FILL — Heparin Sodium (Porcine) Inj 1000 Unit/ML: INTRAMUSCULAR | Qty: 30 | Status: AC

## 2010-11-17 MED FILL — Sodium Chloride IV Soln 0.9%: INTRAVENOUS | Qty: 1000 | Status: AC

## 2010-11-17 MED FILL — Sodium Chloride Irrigation Soln 0.9%: Qty: 3000 | Status: AC

## 2010-11-17 NOTE — Progress Notes (Signed)
Follow up - Critical Care Medicine Note  Patient Details:    Steve [name] is an 77 y.o. male. Brief history 77 y/o male with a AAA and COPD underwent an open AAA repair 11/14/10. PCCM consulted for post operative respiratory acidosis and hypoxemia. Reintubated for progressive resp failure and poor secretion management Lines/tubes:  11/14/10 PA-C >> 11/6 11/14/10 R IJ cordis >>  11/14/10 L radial a line>> 11/6 Microbiology/Sepsis markers:  Sputum 11/6 >> Anti-infectives:  Cefuroxime (post op) >> 11/7 Best Practice/Protocols:  SCD's  Pepcid  Consults:  Vascular SGY  Studies/events:  11/14/10 Open AAA repair  Subjective:    Overnight Issues: Sedated but following commands and nodding to questions this am. Currently on PSV 10  Objective:  Vital signs  Temp:  [97.9 F (36.6 C)-98.8 F (37.1 C)] 97.9 F (36.6 C) (11/08 0400) Pulse Rate:  [48-66] 48  (11/08 0700) Resp:  [13-22] 17  (11/08 0700) BP: (102-164)/(48-69) 144/55 mmHg (11/08 0700) SpO2:  [93 %-98 %] 96 % (11/08 0700) FiO2 (%):  [39.7 %-40.6 %] 40 % (11/08 0747) Weight:  [100.1 kg (220 lb 10.9 oz)] 220 lb 10.9 oz (100.1 kg) (11/08 0400)  Hemodynamic parameters for last 24 hours:     Intake/Output Summary (Last 24 hours) at 11/17/10 0835 Last data filed at 11/17/10 0700  Gross per 24 hour  Intake 1051.44 ml  Output   4100 ml  Net -3048.56 ml    Physical Exam: General: chronically ill appearing, sedated HEENT: mm moist, OGT in place, ETT OK Neuro: sedated, wakes to stim, no agitation, RASS -1 on 0.4 precedex Lungs: resps even non labored, coarse BS without wheeze Cards: s1s2 rrr, no m/r/g  Abd: soft, round, non tender, hypoactive BS  Ext: warm and dry, no edema, SCD in place  Ventilator  Settings: Vent Mode:  [-] CPAP FiO2 (%):  [39.7 %-40.6 %] 40 % Set Rate:  [15 bmp] 15 bmp Vt Set:  [550 mL] 550 mL PEEP:  [4.7 cmH20-5 cmH20] 5 cmH20 Pressure Support:  [10 cmH20] 10 cmH20 Plateau Pressure:  [16  cmH20] 16 cmH20  LAB RESULTS BMET    Component Value Date/Time   NA 140 11/17/2010 0421   K 3.6 11/17/2010 0421   CL 108 11/17/2010 0421   CO2 26 11/17/2010 0421   GLUCOSE 111* 11/17/2010 0421   BUN 21 11/17/2010 0421   CREATININE 0.93 11/17/2010 0421   CREATININE 1.16 09/16/2010 1514   CALCIUM 9.2 11/17/2010 0421   GFRNONAA 80* 11/17/2010 0421   GFRAA >90 11/17/2010 0421   Lab Results  Component Value Date   NA 140 11/17/2010   K 3.6 11/17/2010   CL 108 11/17/2010   CO2 26 11/17/2010   Lab Results  Component Value Date   WBC 13.6* 11/17/2010   HGB 10.3* 11/17/2010   HCT 32.1* 11/17/2010   MCV 98.5 11/17/2010   PLT 91* 11/17/2010   ABG    Component Value Date/Time   PHART 7.425 11/17/2010 0509   HCO3 26.7* 11/17/2010 0509   TCO2 28 11/17/2010 0509   ACIDBASEDEF 2.0 11/16/2010 0501   O2SAT 96.0 11/17/2010 0509    Additional lab data  Radiology CXR: ETT in place. Improved bilateral atx and crowding, interstitial prominence compared with 11/7    Assessment/Plan:   LOS: 3 days   Impression/Plans:  1. Aortic aneurysm, s/p surgical repair 11/5  - follow CBC - PT/OT on hold s/p reintubation - prn fentanyl for pain control 2. Acute and chronic respiratory  failure, due to poor abdominal compliance, narcotics, superimposed on known COPD.  - no plans for extubation at this time due to secretions. Will continue light sedation and plan for WUA and hopefully SBT today 11/8 - combivent per [facility] + prn ventolin - mild pulm edema pattern on CXR 11/7, improved some on 11/8. Agree with repeat diuresis (lasix 20mg  given) 11/8, follow CXR. S Cr stable at 0.93. At risk for ALI vs possible PNA. Will watch fever curve, particularly off abx.  - TTE completed, final read pending 3. COPD (chronic obstructive pulmonary disease). Was only on prn albuterol at home  - changed to combivent as above 4. HTN. BP at goal at this time.  - metoprolol IV d/c'd 11/6 - convert back to home norvasc once extubated, able  to take PO  5. Depression/anxiety  - restart home celexa when able to take PO  - was on xanax at home, watch for withdrawal sx once sedation lifted   Additional comments: None  Critical Care Total Time*: 30 Minutes  Savanna Dooley S. 11/17/2010  *Care during the described time interval was provided by me and/or other providers on the critical care team.  I have reviewed this patient's available data, including medical history, events of note, physical examination and test results as part of my evaluation.

## 2010-11-17 NOTE — Procedures (Signed)
Extubation Procedure Note  Patient Details:   Name: Steve [name] DOB: [birth year] MRN: [id number]   Airway Documentation:  Airway 7.5 mm (Active)  Secured at (cm) 23 cm 11/17/2010  7:47 AM  Measured From Lips 11/17/2010  9:00 AM  Secured Location Center 11/17/2010  9:00 AM  Secured By [employer] 11/17/2010  9:00 AM  Tube Holder Repositioned Yes 11/17/2010  9:00 AM  Cuff Pressure (cm H2O) 22 cm H2O 11/16/2010  8:13 AM  Site Condition Dry 11/17/2010  9:00 AM    Evaluation  O2 sats: stable throughout Complications: No apparent complications Patient did tolerate procedure well. Bilateral Breath Sounds: Diminished Suctioning: Nasal Yes  Steve [name] 11/17/2010, 10:00 AM

## 2010-11-17 NOTE — Progress Notes (Signed)
Noted pt. Now re-intubated and with respiratory distress. Will hold PT/OT at this time and will re-attempt when pt. More medically stable. Thanks! DIENER,RENEE,OTR/L 11/17/2010

## 2010-11-17 NOTE — Progress Notes (Deleted)
Cancel.

## 2010-11-17 NOTE — Progress Notes (Signed)
Subjective: Interval History: Sedated on vent  Objective: Vital signs in last 24 hours: Temp:  [97.9 F (36.6 C)-99.9 F (37.7 C)] 97.9 F (36.6 C) (11/08 0400) Pulse Rate:  [51-67] 63  (11/08 0600) Resp:  [13-22] 22  (11/08 0600) BP: (102-164)/(48-69) 154/63 mmHg (11/08 0600) SpO2:  [93 %-98 %] 97 % (11/08 0600) FiO2 (%):  [39.7 %-40.6 %] 40.6 % (11/08 0600) Weight:  [220 lb 10.9 oz (100.1 kg)] 220 lb 10.9 oz (100.1 kg) (11/08 0400)  Intake/Output from previous day: 11/07 0701 - 11/08 0700 In: 1156.7 [I.V.:876.7; NG/GT:180; IV Piggyback:100] Out: 4175 [Urine:3875; Emesis/NG output:300] Intake/Output this shift:    GI: normal findings: abd soft non-tender.  wd stable  Lab Results:  Basename 11/17/10 0421 11/16/10 0321  WBC 13.6* 16.0*  HGB 10.3* 9.4*  HCT 32.1* 29.2*  PLT 91* 86*   BMET  Basename 11/17/10 0421 11/16/10 0321  NA 140 144  K 3.6 4.3  CL 108 115*  CO2 26 24  GLUCOSE 111* 116*  BUN 21 21  CREATININE 0.93 1.05  CALCIUM 9.2 8.5    Studies/Results: Dg Chest 2 View  11/10/2010  *RADIOLOGY REPORT*  Clinical Data: Preop for repair of abdominal aortic aneurysm  CHEST - 2 VIEW  Comparison: Chest x-ray of 09/10/2010  Findings: The lungs are clear.  Mediastinal contours are stable. The heart is within upper limits of normal.  No acute bony abnormality is seen.  IMPRESSION: Stable chest x-ray.  No active lung disease.  Original Report Authenticated By: Juline Patch, M.D.   Dg Chest Portable 1 View  11/16/2010  *RADIOLOGY REPORT*  Clinical Data: Evaluate ET tube placement  PORTABLE CHEST - 1 VIEW  Comparison: 11/15/2010  Findings: ET tube tip is above the carina.  There is a nasogastric tube with tip below the field of view.  Bilateral pleural effusions and interstitial edema are stable to increased when compared with previous exam.  IMPRESSION:  1. Satisfactory position of ET tube above the carina. 2.  Increase in pleural effusions and edema.  Original Report  Authenticated By: Rosealee Albee, M.D.   Dg Chest Portable 1 View Do After Et Tube Postiion Adjusted  11/15/2010  *RADIOLOGY REPORT*  Clinical Data: Check endotracheal tube position.  PORTABLE CHEST - 1 VIEW  Comparison: 11/15/2010  Findings: Endotracheal tube has been retracted in the interval, with the tip now projecting 2.1 cm proximal to the carina. Right IJ sheath tip projects over the proximal SVC.  No pneumothorax. Cardiomegaly with central vascular congestion and interstitial prominence.  Left lung base opacity and likely small pleural effusion.  No acute osseous abnormality.  IMPRESSION: Endotracheal tube tip 2.1 cm proximal to the carina.  Cardiomegaly with mild pulmonary edema pattern.  Left lung base opacity and possible small pleural effusion.  Original Report Authenticated By: Waneta Martins, M.D.   Dg Chest Portable 1 View  11/15/2010  *RADIOLOGY REPORT*  Clinical Data: Endotracheal tube placement.  PORTABLE CHEST - 1 VIEW  Comparison: 11/15/2010.  Findings: Endotracheal tube tip enters the right mainstem bronchus. Recommend retracting by 4.6 cm.  Swan-Ganz catheter has been removed.  Introducer remains in place projecting over the right lung apex at the expected junction of the brachiocephalic vein/superior vena cava.  Correlation with blood return recommended.  Nasogastric tube tip gastric fundus level.  Cardiomegaly.  Pulmonary vascular prominence most notable centrally.  Basilar atelectasis more notable on the left.  IMPRESSION: Endotracheal tube tip right mainstem bronchus and needs to be  retracted by 4.6 cm.  Please see above discussion.  Critical Value/emergent results were called by telephone at the time of interpretation on 11/15/2010  at 9:04 p.m.  to  [facility] patient's nurse., who verbally acknowledged these results.  Original Report Authenticated By: Fuller Canada, M.D.   Dg Chest Portable 1 View  11/15/2010  *RADIOLOGY REPORT*  Clinical Data: Shortness of breath, AAA  repair  PORTABLE CHEST - 1 VIEW  Comparison: Portable chest x-ray of 11/15/2010  Findings: Aeration of the lungs has improved with improvement in edema.  Basilar atelectasis remains.  Cardiomegaly is stable.  A Swan-Ganz catheter is unchanged in position.  IMPRESSION: Improved aeration with improvement in edema.  Basilar atelectasis remains.  Original Report Authenticated By: Juline Patch, M.D.   Dg Chest Portable 1 View  11/15/2010  *RADIOLOGY REPORT*  Clinical Data: Short of breath  PORTABLE CHEST - 1 VIEW  Comparison: 1340 hours  Findings: Stable Swan-Ganz catheter and NG tube.  Vascular congestion and hazy edema has worsened.  Low volumes and bibasilar atelectasis.  No pneumothorax.  IMPRESSION: Hazy pulmonary edema has worsened.  Stable bibasilar atelectasis.  Original Report Authenticated By: Donavan Burnet, M.D.   Dg Chest Portable 1 View  11/14/2010  *RADIOLOGY REPORT*  Clinical Data: Swan-Ganz catheter placement.  Abdominal aortic aneurysm stent placement.  PORTABLE CHEST - 1 VIEW  Comparison: 11/10/2010  Findings: NG tube tip is below the diaphragm.  Tip of the Swan-Ganz catheter is in the right main pulmonary artery.  Heart size and vascularity are normal.  Slight atelectasis at the lung bases.  No pneumothorax.  IMPRESSION:  Swan-Ganz catheter and NG tube in place.  Mild bibasilar atelectasis.  Original Report Authenticated By: Gwynn Burly, M.D.   Anti-infectives: Anti-infectives     Start     Dose/Rate Route Frequency Ordered Stop   11/14/10 2200   cefUROXime (ZINACEF) 1.5 g in dextrose 5 % 50 mL IVPB        1.5 g 100 mL/hr over 30 Minutes Intravenous Every 12 hours 11/14/10 1658 11/15/10 1048   11/13/10 1415   cefUROXime (ZINACEF) 1.5 g in dextrose 5 % 50 mL IVPB  Status:  Discontinued        1.5 g 100 mL/hr over 30 Minutes Intravenous 60 min pre-op 11/13/10 1413 11/14/10 1201          Assessment/Plan: s/p Procedure(s): ANEURYSM ABDOMINAL AORTIC REPAIR Plan per CCM  regarding extubation.  No other post op problems to date.   LOS: 3 days   EARLY,TODD F 11/17/2010, 7:10 AM

## 2010-11-17 NOTE — Progress Notes (Signed)
Clinical/Bedside Swallow Evaluation Patient Details  Name: Steve [name] MRN: [id number] DOB: July 06, 1933 Today's Date: 11/17/2010  Past Medical History:   Assessment/Recommendations/Treatment Plan  Clinical Impression Statement: Demonstrates an overall functional oral-pharyngeal swallow with only 1 brief instance of a post swallow cough/throat clear with thin liquid trial.  Will f/u min x1 to ensure tolerance of PO diet. Risk for Aspiration: Mild  Recommendations Solid Consistency: Regular Liquid Consistency: Thin Liquid Administration via: Cup;Straw Medication Administration: Whole meds with liquid   Swallow Study Goals  SLP Swallowing Goals Patient will consume recommended diet without observed clinical signs of aspiration with: Independent assistance Patient will utilize recommended strategies during swallow to increase swallowing safety with: Independent assistance    Darrol Poke 11/17/2010,3:06 PM

## 2010-11-17 NOTE — Progress Notes (Signed)
Utilization review completed. Brenna Friesenhahn, RN, BSN. 11/17/10 

## 2010-11-18 ENCOUNTER — Inpatient Hospital Stay (HOSPITAL_COMMUNITY): Payer: Medicare HMO

## 2010-11-18 LAB — BASIC METABOLIC PANEL
BUN: 18 mg/dL (ref 6–23)
Calcium: 8.7 mg/dL (ref 8.4–10.5)
GFR calc non Af Amer: 82 mL/min — ABNORMAL LOW (ref >90)
Glucose, Bld: 90 mg/dL (ref 70–99)
Sodium: 139 mEq/L (ref 135–145)

## 2010-11-18 LAB — CBC
HCT: 32.1 % — ABNORMAL LOW (ref 39.0–52.0)
Hemoglobin: 10.5 g/dL — ABNORMAL LOW (ref 13.0–17.0)
MCH: 31.4 pg (ref 26.0–34.0)
MCHC: 32.7 g/dL (ref 30.0–36.0)

## 2010-11-18 MED ORDER — FLUDROCORTISONE ACETATE 0.1 MG PO TABS
0.1000 mg | ORAL_TABLET | Freq: Every day | ORAL | Status: DC
  Administered 2010-11-18 – 2010-11-20 (×3): 0.1 mg via ORAL
  Filled 2010-11-18 (×4): qty 1

## 2010-11-18 MED ORDER — POTASSIUM CHLORIDE 20 MEQ/15ML (10%) PO LIQD
ORAL | Status: AC
  Filled 2010-11-18: qty 15

## 2010-11-18 MED ORDER — POTASSIUM CHLORIDE 20 MEQ/15ML (10%) PO LIQD
ORAL | Status: AC
  Filled 2010-11-18: qty 30

## 2010-11-18 MED ORDER — IPRATROPIUM BROMIDE 0.02 % IN SOLN
0.5000 mg | Freq: Four times a day (QID) | RESPIRATORY_TRACT | Status: DC
  Administered 2010-11-18 – 2010-11-20 (×8): 0.5 mg via RESPIRATORY_TRACT
  Filled 2010-11-18 (×8): qty 2.5

## 2010-11-18 MED ORDER — TRAMADOL HCL 50 MG PO TABS
100.0000 mg | ORAL_TABLET | Freq: Three times a day (TID) | ORAL | Status: DC | PRN
  Administered 2010-11-18 – 2010-11-21 (×3): 100 mg via ORAL
  Filled 2010-11-18 (×3): qty 2

## 2010-11-18 MED ORDER — TAMSULOSIN HCL 0.4 MG PO CAPS
0.4000 mg | ORAL_CAPSULE | ORAL | Status: DC
  Administered 2010-11-18 – 2010-11-20 (×3): 0.4 mg via ORAL
  Filled 2010-11-18 (×7): qty 1

## 2010-11-18 MED ORDER — OXYCODONE-ACETAMINOPHEN 5-325 MG PO TABS: 1.0000 | ORAL_TABLET | ORAL | Status: DC | PRN

## 2010-11-18 MED ORDER — AMLODIPINE BESYLATE 10 MG PO TABS
10.0000 mg | ORAL_TABLET | Freq: Every day | ORAL | Status: DC
  Administered 2010-11-18: 10 mg via ORAL
  Filled 2010-11-18 (×2): qty 1

## 2010-11-18 MED ORDER — ALBUTEROL SULFATE (5 MG/ML) 0.5% IN NEBU
2.5000 mg | INHALATION_SOLUTION | Freq: Four times a day (QID) | RESPIRATORY_TRACT | Status: DC
  Administered 2010-11-18 – 2010-11-20 (×8): 2.5 mg via RESPIRATORY_TRACT
  Filled 2010-11-18 (×8): qty 0.5

## 2010-11-18 MED ORDER — POTASSIUM CHLORIDE 20 MEQ/15ML (10%) PO LIQD
40.0000 meq | Freq: Once | ORAL | Status: AC
  Administered 2010-11-18: 40 meq via ORAL
  Filled 2010-11-18: qty 30

## 2010-11-18 MED ORDER — LORATADINE 10 MG PO TABS
10.0000 mg | ORAL_TABLET | Freq: Every day | ORAL | Status: DC
  Administered 2010-11-18 – 2010-11-20 (×3): 10 mg via ORAL
  Filled 2010-11-18 (×4): qty 1

## 2010-11-18 MED ORDER — ASPIRIN 81 MG PO CHEW
81.0000 mg | CHEWABLE_TABLET | Freq: Every day | ORAL | Status: DC
  Administered 2010-11-18 – 2010-11-20 (×3): 81 mg via ORAL
  Filled 2010-11-18 (×4): qty 1

## 2010-11-18 MED ORDER — FUROSEMIDE 10 MG/ML IJ SOLN
20.0000 mg | Freq: Once | INTRAMUSCULAR | Status: AC
  Administered 2010-11-18: 20 mg via INTRAVENOUS
  Filled 2010-11-18: qty 2

## 2010-11-18 MED ORDER — POTASSIUM CHLORIDE 10 MEQ/100ML IV SOLN
10.0000 meq | INTRAVENOUS | Status: AC
  Administered 2010-11-18 (×4): 10 meq via INTRAVENOUS

## 2010-11-18 MED ORDER — CITALOPRAM HYDROBROMIDE 20 MG PO TABS
20.0000 mg | ORAL_TABLET | Freq: Every day | ORAL | Status: DC
  Administered 2010-11-18 – 2010-11-20 (×3): 20 mg via ORAL
  Filled 2010-11-18 (×4): qty 1

## 2010-11-18 NOTE — Progress Notes (Signed)
Follow up - Critical Care Medicine Note  Patient Details:    Steve [name] is an 77 y.o. male. Brief history 77 y/o male with a AAA and COPD underwent an open AAA repair 11/14/10. PCCM consulted for post operative respiratory acidosis and hypoxemia. Reintubated for progressive resp failure and poor secretion management Lines/tubes:  11/14/10 PA-C >> 11/6 11/14/10 R IJ cordis >>  11/14/10 L radial a line>> 11/6 11/15/10 ETT >> 11/8 Microbiology/Sepsis markers:  Sputum 11/6 >> Anti-infectives:  Cefuroxime (post op) >> 11/7 Best Practice/Protocols:  SCD's  Pepcid  Consults:  Vascular SGY  Studies/events:  11/14/10 Open AAA repair 11/16/10 TTE >> normal LV fxn  Subjective:    Overnight Issues: no problems overnight reported, patient tolerated extubation well 11/8,  a swallow evaluation was performed 11/8 and he was deemed acceptable for a normal diet, currently out of bed to chair, he received Lasix 11/8 and again this morning 11/9  Objective:  Vital signs  Temp:  [97.6 F (36.4 C)-99.2 F (37.3 C)] 98.9 F (37.2 C) (11/09 0400) Pulse Rate:  [51-73] 67  (11/09 0800) Resp:  [8-28] 20  (11/09 0800) BP: (125-156)/(51-81) 132/59 mmHg (11/09 0800) SpO2:  [92 %-99 %] 95 % (11/09 0800) FiO2 (%):  [40.2 %] 40.2 % (11/08 0900) Weight:  [93.3 kg (205 lb 11 oz)] 205 lb 11 oz (93.3 kg) (11/09 0600)  Hemodynamic parameters for last 24 hours:     Intake/Output Summary (Last 24 hours) at 11/18/10 0849 Last data filed at 11/18/10 0700  Gross per 24 hour  Intake   1078 ml  Output   3270 ml  Net  -2192 ml    Physical Exam: General: chronically ill appearing, up to chair, much improved, stronger HEENT: mm moist, PERRL Neuro: awake interacting normally, much stronger Lungs: resps even non labored, distant BS without wheeze Cards: s1s2 rrr, no m/r/g  Abd: soft, round, non tender, hypoactive BS  Ext: warm and dry, no edema, SCD in place  Ventilator  Settings:  LAB RESULTS BMET      Component Value Date/Time   NA 139 11/18/2010 0430   K 2.9* 11/18/2010 0430   CL 105 11/18/2010 0430   CO2 26 11/18/2010 0430   GLUCOSE 90 11/18/2010 0430   BUN 18 11/18/2010 0430   CREATININE 0.88 11/18/2010 0430   CREATININE 1.16 09/16/2010 1514   CALCIUM 8.7 11/18/2010 0430   GFRNONAA 82* 11/18/2010 0430   GFRAA >90 11/18/2010 0430   Lab Results  Component Value Date   WBC 10.4 11/18/2010   HGB 10.5* 11/18/2010   HCT 32.1* 11/18/2010   MCV 96.1 11/18/2010   PLT 113* 11/18/2010   ABG    Component Value Date/Time   PHART 7.425 11/17/2010 0509   HCO3 26.7* 11/17/2010 0509   TCO2 28 11/17/2010 0509   ACIDBASEDEF 2.0 11/16/2010 0501   O2SAT 96.0 11/17/2010 0509    Additional lab data  Radiology CXR: Dg Chest Portable 1 View  11/18/2010  *RADIOLOGY REPORT*  Clinical Data: Evaluate lines.  PORTABLE CHEST - 1 VIEW  Comparison: 11/17/2010  Findings: Removal of endotracheal tube and nasogastric tube. Right jugular sheath is still in place.  Patchy densities at the lung bases are most compatible with atelectasis.  No evidence for a large pneumothorax.  Heart size is stable.  IMPRESSION: Basilar lung densities are most compatible with atelectasis.  Removal of support apparatuses as described.  Original Report Authenticated By: Richarda Overlie, M.D.   Dg Chest Portable 1 View  11/17/2010  *RADIOLOGY REPORT*  Clinical Data: Intubated, post abdominal aortic aneurysm repair  PORTABLE CHEST - 1 VIEW  Comparison: Portable exam 0545 hours compared to 11/26/2010  Findings: Tip of endotracheal tube 4.1 cm above carina. Nasogastric tube extends into stomach. Right jugular central venous catheter, tip SVC; the catheter is kinked proximally. Minimal enlargement of cardiac silhouette. Pulmonary vascular congestion. Mild perihilar infiltrates/edema. Bibasilar effusions and minimal atelectasis. No pneumothorax.  IMPRESSION: No interval change.  Original Report Authenticated By: Lollie Marrow, M.D.      Assessment/Plan:    LOS: 4 days   Impression/Plans:  1. Aortic aneurysm, s/p surgical repair 11/5  - follow CBC - start PT/OT - prn fentanyl for pain control  2. Acute and chronic respiratory failure, due to poor abdominal compliance, narcotics, superimposed on known COPD. Much improved extubated 11/8. Chest x-ray with some residual atelectasis - change Combivent to scheduled albuterol and Atrovent nebulizers - mild pulm edema pattern on CXR 11/7, improved some on 11/8. Lasix given on 11/6 - 11/8, 20 mg ordered again today. Suspect he is euvolemic. - would place new peripheral IVs and remove Cordis  3. COPD (chronic obstructive pulmonary disease). Was only on prn albuterol at home. Will need a more consistent scheduled bronchodilator regimen at the time of discharge. - change albuterol and ipratropium nebulizers to 4 times a day instead of every 6 hours  4. HTN. BP at goal at this time. Echocardiogram with normal LV function - Restart home norvasc on 11/10  - would recommend holding further diuresis, he does not appear to be overtly volume overloaded.  5. Depression/anxiety  - restart home celexa 11/9 - was on xanax at home, watch for withdrawal sx once sedation lifted  6. Hypokalemia - replace, give an additional (IV KCl ordered by VSGY)  Additional comments: None  Mykira Hofmeister S. 11/18/2010  *Care during the described time interval was provided by me and/or other providers on the critical care team.  I have reviewed this patient's available data, including medical history, events of note, physical examination and test results as part of my evaluation.

## 2010-11-18 NOTE — Progress Notes (Signed)
[facility] [place]  Post-op  OPEN AAA Repair  Date of Surgery: 11/14/2010 Surgeon: Surgeon(s): Larina Earthly, MD POD #  4 Days Post-Op  History of Present Illness  Steve [name] is a 77 y.o. male who is  up s/p Open repair of Abdominal Aortic Aneurysm . Pt is doing well. denies incisional pain; denies nausea/vomiting; denies diarrhea. has had flatus;has not had BM. Pt is hungry. He states his breathing is ok but still feels SOB.  Extubated yesterday pulmonary function improved.  Significant Diagnostic Studies: CBC    Component Value Date/Time   WBC 10.4 11/18/2010 0430   RBC 3.34* 11/18/2010 0430   HGB 10.5* 11/18/2010 0430   HCT 32.1* 11/18/2010 0430   PLT 113* 11/18/2010 0430   MCV 96.1 11/18/2010 0430   MCH 31.4 11/18/2010 0430   MCHC 32.7 11/18/2010 0430   RDW 14.1 11/18/2010 0430   LYMPHSABS 1.6 09/06/2010 2101   MONOABS 0.7 09/06/2010 2101   EOSABS 0.2 09/06/2010 2101   BASOSABS 0.0 09/06/2010 2101    BMET    Component Value Date/Time   NA 139 11/18/2010 0430   K 2.9* 11/18/2010 0430   CL 105 11/18/2010 0430   CO2 26 11/18/2010 0430   GLUCOSE 90 11/18/2010 0430   BUN 18 11/18/2010 0430   CREATININE 0.88 11/18/2010 0430   CREATININE 1.16 09/16/2010 1514   CALCIUM 8.7 11/18/2010 0430   GFRNONAA 82* 11/18/2010 0430   GFRAA >90 11/18/2010 0430    COAG Lab Results  Component Value Date   INR 1.06 11/14/2010   INR 1.01 11/10/2010   INR 1.03 08/01/2010   No results found for this basename: PTT    I/O last 3 completed shifts: In: 1655.3 [P.O.:560; I.V.:825.3; NG/GT:120; IV Piggyback:150] Out: 4350 [Urine:4050; Emesis/NG output:300]    Physical Examination Patient Vitals for the past 24 hrs:  BP Temp Temp src Pulse Resp SpO2 Weight  11/18/10 0700 135/81 mmHg - - - - - -  11/18/10 0600 156/65 mmHg - - 70  21  94 % 205 lb 11 oz (93.3 kg)  11/18/10 0500 140/77 mmHg - - 68  23  95 % -  11/18/10 0400 131/51 mmHg 98.9 F (37.2 C) Oral 68  21  94 % -    11/18/10 0300 132/52 mmHg - - 73  22  92 % -  11/18/10 0200 140/51 mmHg - - 72  28  95 % -  11/18/10 0130 - - - 68  18  95 % -  11/18/10 0100 137/51 mmHg - - 69  12  96 % -  11/18/10 0000 137/51 mmHg 98.9 F (37.2 C) Oral 68  22  95 % -  11/17/10 2300 138/56 mmHg - - 69  28  95 % -  11/17/10 2200 137/61 mmHg - - 70  20  95 % -  11/17/10 2100 143/58 mmHg - - 69  22  96 % -  11/17/10 2000 128/59 mmHg 99.2 F (37.3 C) Oral 73  24  95 % -  11/17/10 1927 - - - 71  25  97 % -  11/17/10 1900 142/61 mmHg - - 67  17  98 % -  11/17/10 1800 146/65 mmHg - - 65  15  96 % -  11/17/10 1700 137/66 mmHg - - 65  21  96 % -  11/17/10 1600 140/59 mmHg 98.6 F (37 C) Oral 64  19  97 % -  11/17/10 1500  147/59 mmHg - - 65  22  97 % -  11/17/10 1400 137/63 mmHg - - 63  19  95 % -  11/17/10 1300 134/59 mmHg - - 61  21  95 % -  11/17/10 1200 151/60 mmHg 97.6 F (36.4 C) Oral 59  21  98 % -  11/17/10 1100 144/60 mmHg - - 54  18  97 % -  11/17/10 1002 147/58 mmHg - - 56  19  97 % -  11/17/10 1000 147/58 mmHg - - 55  23  97 % -  11/17/10 0900 125/56 mmHg - - 51  18  99 % -  11/17/10 0800 129/57 mmHg 96.2 F (35.7 C) Oral 51  13  97 % -    O2 Sats 94 % on Nasal cannula (liters/minute);  2 LPM  General: A&O x 3, WDWN male in NAD Pulmonary: normal non-labored breathing , +  Rales,without rhonchi,  wheezing Cardiac: Heart rate : regular , without  Murmurs,  Abdomen:abdomen soft, non-tender and normal active bowel sounds Abdominal wound:clean, dry, intac  Neurologic: A&O X 3; Appropriate Affect ; SENSATION: normal; MOTOR FUNCTION:  moving all extremities equally. Speech is fluent/normal  Vascular Exam:BLE warm and well perfused Extremities without ischemic changes, no Gangrene , no cellulitis; no open wounds;   LOWER EXTREMITY PULSES           RIGHT                                      LEFT      POSTERIOR TIBIAL palpable palpable       DORSALIS PEDIS      ANTERIOR TIBIAL palpable doppler     Assessment: Steve [name] is a 77 y.o. male who is 4 days s/p  Open repair of Abdominal Aortic Aneurysm . He has normal bowel function Hypokalemia sec. To diuresis without ectopy gen fatigue PO acute blood loss anemia- stable  Plan:  Resume home meds Clear liquids Replace K+ Repeat labs PT/OT Repeat lasix today needs to diurese 1-2 liters over the next few days Cont foley for strict I/O Poss transfer in am if remains stable   Marlowe Shores 7:36 AM 11/18/2010   Agree  Fabienne Bruns, MD [facility] Office: [phone] Pager: [phone]

## 2010-11-18 NOTE — Progress Notes (Signed)
Physical Therapy Treatment Patient Details Name: Steve [name] MRN: [id number] DOB: [birth year] Today's Date: 11/18/2010  PT Assessment/Plan  PT - Assessment/Plan PT Plan: Discharge plan remains appropriate PT Frequency: Min 3X/week Follow Up Recommendations: Other (comment) (st-snf for rehab vs HHPT) Equipment Recommended: Defer to next venue PT Goals  Acute Rehab PT Goals PT Goal Formulation: With patient Time For Goal Achievement: 2 weeks PT Goal: Supine/Side to Sit - Progress:  (not addressed today) PT Transfer Goal: Sit to Stand/Stand to Sit - Progress: Progressing toward goal PT Transfer Goal: Bed to Chair/Chair to Bed - Progress: Progressing toward goal PT Goal: Ambulate - Progress: Progressing toward goal Pt will Go Up / Down Stairs: Other (comment) (not addressed today)  PT Treatment Precautions/Restrictions  Precautions Precautions: Fall Required Braces or Orthoses: No Mobility (including Balance) Transfers Transfers: Yes Sit to Stand: 3: Mod assist;From bed Sit to Stand Details (indicate cue type and reason): vc's for technique; hand placement Ambulation/Gait Ambulation/Gait: Yes Ambulation/Gait Assistance: 4: Min assist (+1 for lines/tubes) Ambulation/Gait Assistance Details (indicate cue type and reason): vc's for postural correction, improved use of RW Ambulation Distance (Feet): 48 Feet Assistive device: Rolling walker Gait Pattern:  (short step length)    Exercise    End of Session PT - End of Session Activity Tolerance: Patient limited by fatigue (pt reported that he was dizzy) Patient left: in chair Nurse Communication: Mobility status for ambulation General Behavior During Session: [facility] for tasks performed Cognition: [facility] for tasks performed  Tiffancy Moger, Eliseo Gum 11/18/2010, 10:46 AM  .11/18/2010  [place] Bing, PT [phone] (pager)

## 2010-11-18 NOTE — Progress Notes (Signed)
Speech Pathology: Dysphagia Treatment Note  Patient was observed with : Thin liquids  Patient was noted to have s/s of aspiration : No  Lung Sounds:  Diminished per RN   Temperature: Afebrile  Patient required: no intervention   Other: Demonstrates a functional oral-pharyngeal swallow with current clear liquid diet and no overt s/s of aspiration.  Completed education with patient regarding best positioning and safe swallowing techniques.  Recommendations:  1.  Continue current diet 2.  No further skilled SLP services at this time.  Pain:   none Intervention Required:   No  Goals: All goals met   Myra Rude, M.S.,CCC-SLP Pager [phone]   }

## 2010-11-18 NOTE — Progress Notes (Signed)
Occupational Therapy Treatment Patient Details Name: Steve [name] MRN: [id number] DOB: 05/08/1933 Today's Date: 11/18/2010  OT Assessment/Plan OT Assessment/Plan Comments on Treatment Session: Pt. with increased motivation for OOB activity OT Plan: Discharge plan remains appropriate OT Frequency: Min 1X/week Follow Up Recommendations: ST-Skilled nursing facility Equipment Recommended: Defer to next venue OT Goals Acute Rehab OT Goals OT Goal Formulation: With patient Time For Goal Achievement: 2 weeks ADL Goals ADL Goal: Grooming - Progress: Progressing toward goals ADL Goal: Upper Body Bathing - Progress: Progressing toward goals ADL Goal: Lower Body Bathing - Progress: Not addressed ADL Goal: Upper Body Dressing - Progress: Progressing toward goals ADL Goal: Lower Body Dressing - Progress: Progressing toward goals  OT Treatment Precautions/Restrictions  Precautions Precautions: Fall Required Braces or Orthoses: No   ADL ADL Eating/Feeding: Not assessed Grooming: Performed;Wash/dry face;Set up;Minimal assistance Where Assessed - Grooming: Standing at sink Upper Body Bathing: Not assessed Lower Body Bathing: Not assessed Upper Body Dressing: Performed;Minimal assistance Where Assessed - Upper Body Dressing: Sitting, chair Lower Body Dressing: Performed;Maximal assistance Lower Body Dressing Details (indicate cue type and reason): Pt. able to lift leg up in attempt to don bilateral socks, however pt. unable to reach forward to don socks and requires assist Where Assessed - Lower Body Dressing: Sit to stand from chair Toilet Transfer: Not assessed Toilet Transfer Method: Not assessed Toileting - Hygiene: Not assessed Where Assessed - Toileting Hygiene: Not assessed Tub/Shower Transfer: Not assessed Tub/Shower Transfer Method: Not assessed Equipment Used: Rolling walker ADL Comments: Pt. with dizziness with activity today, vitals stable and pt. reports this was occurring  PTA and may be associated with medications. Pt. completed ambulation of ~48' with RW and min assist. Pt. also performed bilateral reaching up towards ceiling of 10 reps each side to increase activity tolerance in prep for ADLs. Mobility  Bed Mobility Bed Mobility: No Transfers Transfers: Yes Sit to Stand: 3: Mod assist;From bed Sit to Stand Details (indicate cue type and reason): vc's for technique; hand placement Exercises General Exercises - Upper Extremity Shoulder Flexion: AROM;10 reps;Seated;Both Elbow Flexion: AROM;10 reps;Both;Seated Elbow Extension: AROM;10 reps;Both;Seated  End of Session OT - End of Session Equipment Utilized During Treatment: Gait belt Activity Tolerance:  (Pt. limited by dizziness) Patient left: in chair;with call bell in reach Nurse Communication: Mobility status for transfers General Behavior During Session: [facility] for tasks performed Cognition: [facility] for tasks performed  Tye Juarez,RENEEOTR/L Pager: [phone]  11/18/2010, 10:49 AM

## 2010-11-19 ENCOUNTER — Encounter (HOSPITAL_COMMUNITY): Payer: Self-pay | Admitting: Cardiology

## 2010-11-19 DIAGNOSIS — I4891 Unspecified atrial fibrillation: Secondary | ICD-10-CM | POA: Diagnosis not present

## 2010-11-19 LAB — ANAEROBIC CULTURE

## 2010-11-19 LAB — CBC
MCH: 31.6 pg (ref 26.0–34.0)
MCV: 95.8 fL (ref 78.0–100.0)
Platelets: 114 10*3/uL — ABNORMAL LOW (ref 150–400)
RDW: 14 % (ref 11.5–15.5)

## 2010-11-19 LAB — BASIC METABOLIC PANEL
Calcium: 8.6 mg/dL (ref 8.4–10.5)
Creatinine, Ser: 0.8 mg/dL (ref 0.50–1.35)
GFR calc Af Amer: >90 mL/min (ref >90)
GFR calc non Af Amer: 85 mL/min — ABNORMAL LOW (ref >90)
Sodium: 138 mEq/L (ref 135–145)

## 2010-11-19 LAB — GLUCOSE, CAPILLARY

## 2010-11-19 LAB — CULTURE, RESPIRATORY W GRAM STAIN

## 2010-11-19 MED ORDER — METOPROLOL TARTRATE 12.5 MG HALF TABLET
12.5000 mg | ORAL_TABLET | Freq: Two times a day (BID) | ORAL | Status: DC
  Filled 2010-11-19 (×2): qty 1

## 2010-11-19 MED ORDER — POTASSIUM CHLORIDE CRYS ER 20 MEQ PO TBCR
40.0000 meq | EXTENDED_RELEASE_TABLET | Freq: Once | ORAL | Status: AC
  Administered 2010-11-19: 40 meq via ORAL
  Filled 2010-11-19: qty 2

## 2010-11-19 MED ORDER — METOPROLOL TARTRATE 1 MG/ML IV SOLN
2.5000 mg | INTRAVENOUS | Status: DC | PRN
  Administered 2010-11-19: 2.5 mg via INTRAVENOUS

## 2010-11-19 MED ORDER — POTASSIUM CHLORIDE 20 MEQ/15ML (10%) PO LIQD
40.0000 meq | Freq: Every day | ORAL | Status: DC
  Filled 2010-11-19: qty 30

## 2010-11-19 MED ORDER — POTASSIUM CHLORIDE 10 MEQ/100ML IV SOLN
10.0000 meq | INTRAVENOUS | Status: AC
  Administered 2010-11-19 (×4): 10 meq via INTRAVENOUS

## 2010-11-19 MED ORDER — ENSURE CLINICAL ST REVIGOR PO LIQD
237.0000 mL | Freq: Three times a day (TID) | ORAL | Status: DC
  Administered 2010-11-19 – 2010-11-21 (×5): 237 mL via ORAL

## 2010-11-19 MED ORDER — POTASSIUM CHLORIDE 10 MEQ/50ML IV SOLN
INTRAVENOUS | Status: AC
  Administered 2010-11-19: 10 meq
  Filled 2010-11-19: qty 200

## 2010-11-19 MED ORDER — METOPROLOL TARTRATE 25 MG PO TABS
25.0000 mg | ORAL_TABLET | Freq: Two times a day (BID) | ORAL | Status: DC
  Administered 2010-11-19 – 2010-11-20 (×3): 25 mg via ORAL
  Filled 2010-11-19 (×5): qty 1

## 2010-11-19 MED ORDER — FAMOTIDINE 20 MG PO TABS
20.0000 mg | ORAL_TABLET | Freq: Two times a day (BID) | ORAL | Status: DC
  Administered 2010-11-19 – 2010-11-20 (×4): 20 mg via ORAL
  Filled 2010-11-19 (×7): qty 1

## 2010-11-19 MED ORDER — METOPROLOL TARTRATE 1 MG/ML IV SOLN
INTRAVENOUS | Status: AC
  Administered 2010-11-19: 2.5 mg via INTRAVENOUS
  Filled 2010-11-19: qty 5

## 2010-11-19 MED ORDER — POTASSIUM CHLORIDE 20 MEQ/15ML (10%) PO LIQD
40.0000 meq | Freq: Once | ORAL | Status: DC
  Filled 2010-11-19: qty 30

## 2010-11-19 NOTE — Consults (Signed)
HPI: 77 year old male with past medical history of hypertension, COPD and now status post abdominal aortic aneurysm repair for evaluation of postoperative atrial fibrillation. The patient did have an echocardiogram performed in July of 2012. There was normal LV function. There was grade 1 diastolic dysfunction and mild left atrial enlargement. The ascending aorta was mildly dilated. The patient had a Myoview performed on September 5 of 2012. Ejection fraction was 62%. There was diaphragmatic attenuation but no ischemia. He typically has some dyspnea on exertion but no orthopnea, PND, pedal edema, exertional chest pain or syncope. The patient had repair of an abdominal aortic aneurysm on November 5. Postoperatively there were some difficulties weaning from the ventilator but he was extubated November 8. The patient was noted to go into atrial fibrillation today and cardiology is asked to evaluate. He denies increased dyspnea, chest pain, palpitations or syncope.  Medications Prior to Admission  Medication Dose Route Frequency Provider Last Rate Last Dose  . 0.9 %  sodium chloride infusion  500 mL Intravenous Once PRN Marlowe Shores, PA      . acetaminophen (TYLENOL) tablet 325-650 mg  325-650 mg Oral Q4H PRN Marlowe Shores, PA       Or  . acetaminophen (TYLENOL) suppository 325-650 mg  325-650 mg Rectal Q4H PRN Marlowe Shores, PA      . albuterol (PROVENTIL) (5 MG/ML) 0.5% nebulizer solution 2.5 mg  2.5 mg Nebulization QID Leslye Peer, MD   2.5 mg at 11/19/10 0734  . alum & mag hydroxide-simeth (MAALOX PLUS) 400-400-40 MG/5ML suspension 15-30 mL  15-30 mL Oral Q2H PRN Marlowe Shores, PA      . amLODipine (NORVASC) tablet 10 mg  10 mg Oral Daily Amelia Jo [place], [place]   10 mg at 11/18/10 1043  . antiseptic oral rinse (BIOTENE) solution 15 mL  15 mL Mouth Rinse QID Max Fickle, MD   15 mL at 11/18/10 1600  . aspirin chewable tablet 81 mg  81 mg Oral Daily Amelia Jo [place], [place]   81 mg at  11/18/10 1047  . bisacodyl (DULCOLAX) suppository 10 mg  10 mg Rectal Daily PRN Marlowe Shores, PA      . cefUROXime (ZINACEF) 1.5 g in dextrose 5 % 50 mL IVPB  1.5 g Intravenous Q12H Amelia Jo Rocz[place], PA   1.5 g at 11/15/10 1018  . chlorhexidine (PERIDEX) 0.12 % solution 15 mL  15 mL Mouth/Throat BID Max Fickle, MD   15 mL at 11/18/10 2000  . citalopram (CELEXA) tablet 20 mg  20 mg Oral Daily Amelia Jo [place], [place]   20 mg at 11/18/10 1044  . dextrose 5 % and 0.9 % NaCl with KCl 20 mEq/L infusion   Intravenous Continuous Leslye Peer, MD 20 mL/hr at 11/19/10 0600    . docusate sodium (COLACE) capsule 100 mg  100 mg Oral Daily Marlowe Shores, PA   100 mg at 11/18/10 1043  . etomidate (AMIDATE) injection 10 mg  10 mg Intravenous Once Max Fickle, MD   10 mg at 11/15/10 2030  . famotidine (PEPCID) tablet 20 mg  20 mg Oral BID Marlowe Shores, [place]      . feeding supplement (ENSURE CLINICAL STRENGTH) liquid 237 mL  237 mL Oral TID WC Amelia Jo Roczniak, PA      . fentaNYL (SUBLIMAZE) 0.05 MG/ML injection        50 mcg at 11/15/10 2030  . fludrocortisone (FLORINEF) tablet 0.1 mg  0.1 mg Oral Daily Amelia Jo [place], [place]   0.1 mg at 11/18/10 1047  . furosemide (LASIX) injection 20 mg  20 mg Intravenous Once Marlowe Shores, PA   20 mg at 11/17/10 0830  . furosemide (LASIX) injection 20 mg  20 mg Intravenous Once Marlowe Shores, PA   20 mg at 11/18/10 1052  . furosemide (LASIX) injection 40 mg  40 mg Intravenous Once Marlowe Shores, PA   40 mg at 11/16/10 0834  . guaiFENesin-dextromethorphan (ROBITUSSIN DM) 100-10 MG/5ML syrup 15 mL  15 mL Oral Q4H PRN Marlowe Shores, PA      . HYDROmorphone (DILAUDID) 1 MG/ML injection           . ipratropium (ATROVENT) nebulizer solution 0.5 mg  0.5 mg Nebulization QID Leslye Peer, MD   0.5 mg at 11/19/10 0734  . loratadine (CLARITIN) tablet 10 mg  10 mg Oral Daily Amelia Jo [place], [place]   10 mg at 11/18/10 1045  . magnesium hydroxide  (MILK OF MAGNESIA) suspension 30 mL  30 mL Oral PRN Amelia Jo Roczniak, PA      . magnesium sulfate 2 g in dextrose 5 % 100 mL IVPB  2 g Intravenous Once PRN Marlowe Shores, PA      . metoprolol (LOPRESSOR) injection 2.5-5 mg  2.5-5 mg Intravenous Q3H PRN Elizabeth Deterding   2.5 mg at 11/19/10 0500  . metoprolol tartrate (LOPRESSOR) tablet 12.5 mg  12.5 mg Oral BID Marlowe Shores, PA      . mupirocin (BACTROBAN) 2 % ointment   Nasal BID Larina Earthly, MD      . oxyCODONE-acetaminophen (PERCOCET) 5-325 MG per tablet 1 tablet  1 tablet Oral Q4H PRN Marlowe Shores, PA      . phenol (CHLORASEPTIC) mouth spray 1 spray  1 spray Mouth/Throat PRN Marlowe Shores, PA      . polyethylene glycol (MIRALAX / GLYCOLAX) packet 17 g  17 g Oral Daily PRN Amelia Jo Roczniak, PA      . potassium chloride 10 mEq in 100 mL IVPB  10 mEq Intravenous Q1 Hr x 2 Amelia Jo [place], [place]   10 mEq at 11/17/10 0926  . potassium chloride 10 mEq in 100 mL IVPB  10 mEq Intravenous Q1 Hr x 4 Regina [place], [place]   10 mEq at 11/18/10 1058  . potassium chloride 10 mEq in 100 mL IVPB  10 mEq Intravenous Q1 Hr x 4 Regina J [place], [place]      . potassium chloride 10 MEQ/50ML IVPB           . potassium chloride 20 MEQ/15ML (10%) liquid 40 mEq  40 mEq Oral Once Leslye Peer, MD   40 mEq at 11/18/10 1133  . potassium chloride 20 MEQ/15ML (10%) liquid 40 mEq  40 mEq Oral Once Leslye Peer, MD      . potassium chloride 20 MEQ/15ML (10%) liquid           . potassium chloride SA (K-DUR,KLOR-CON) CR tablet 20-40 mEq  20-40 mEq Oral Once PRN Marlowe Shores, PA      . rocuronium [facility]) injection 57.3 mg  0.6 mg/kg Intravenous Once Max Fickle, MD      . sodium chloride 0.9 % injection 9 mL  9 mL Intravenous PRN Marlowe Shores, PA      . Tamsulosin HCl (FLOMAX) capsule 0.4 mg  0.4 mg Oral PC supper Amelia Jo  Roczniak, PA   0.4 mg at 11/18/10 2254  . traMADol (ULTRAM) tablet 100 mg  100 mg Oral Q8H PRN Marlowe Shores,  PA   100 mg at 11/18/10 1022  . DISCONTD: 0.9 %  sodium chloride infusion   Intravenous Continuous Max Fickle, MD 999 mL/hr at 11/15/10 2015    . DISCONTD: albuterol (PROVENTIL HFA;VENTOLIN HFA) 108 (90 BASE) MCG/ACT inhaler 6 puff  6 puff Inhalation Q4H PRN Leslye Peer, MD      . DISCONTD: albuterol (PROVENTIL) (5 MG/ML) 0.5% nebulizer solution 2.5 mg  2.5 mg Nebulization Q4H Max Fickle, MD   2.5 mg at 11/15/10 0727  . DISCONTD: albuterol (PROVENTIL) (5 MG/ML) 0.5% nebulizer solution 2.5 mg  2.5 mg Nebulization Q6H Leslye Peer, MD      . DISCONTD: albuterol (PROVENTIL) (5 MG/ML) 0.5% nebulizer solution 2.5 mg  2.5 mg Nebulization Q4H PRN Leslye Peer, MD      . DISCONTD: albuterol (PROVENTIL) (5 MG/ML) 0.5% nebulizer solution 2.5 mg  2.5 mg Nebulization Q6H Leslye Peer, MD   2.5 mg at 11/16/10 0811  . DISCONTD: albuterol (PROVENTIL) (5 MG/ML) 0.5% nebulizer solution 2.5 mg  2.5 mg Nebulization Q6H Leslye Peer, MD   2.5 mg at 11/18/10 0852  . DISCONTD: albuterol-ipratropium (COMBIVENT) inhaler 6 puff  6 puff Inhalation Q6H Leslye Peer, MD   6 puff at 11/17/10 0739  . DISCONTD: cefUROXime (ZINACEF) 1.5 g in dextrose 5 % 50 mL IVPB  1.5 g Intravenous 60 min Pre-Op Larina Earthly, MD      . DISCONTD: dexmedetomidine (PRECEDEX) 200 mcg in sodium chloride 0.9 % 50 mL infusion  0.2-1.2 mcg/kg/hr Intravenous Continuous Max Fickle, MD   0.2 mcg/kg/hr at 11/17/10 0926  . DISCONTD: diphenhydrAMINE (BENADRYL) 12.5 MG/5ML elixir 12.5 mg  12.5 mg Oral Q6H PRN Marlowe Shores, PA      . DISCONTD: diphenhydrAMINE (BENADRYL) injection 12.5 mg  12.5 mg Intravenous Q6H PRN Marlowe Shores, PA      . DISCONTD: DOPamine (INTROPIN) 800 mg in dextrose 5 % 250 ml infusion  3-5 mcg/kg/min Intravenous Continuous Marlowe Shores, [place]      . DISCONTD: famotidine (PEPCID) IVPB 20 mg  20 mg Intravenous Q12H Marlowe Shores, PA   20 mg at 11/18/10 2217  . DISCONTD: fentaNYL (SUBLIMAZE)  injection 12.5-25 mcg  12.5-25 mcg Intravenous Q2H PRN Leslye Peer, MD   25 mcg at 11/17/10 2148  . DISCONTD: fentaNYL (SUBLIMAZE) injection 25-50 mcg  25-50 mcg Intravenous Q2H PRN Max Fickle, MD   50 mcg at 11/16/10 0607  . DISCONTD: heparin 6,000 Units in sodium chloride 0.9 % 500 mL irrigation    PRN Larina Earthly, MD      . DISCONTD: hydrALAZINE (APRESOLINE) injection 10 mg  10 mg Intravenous Q2H PRN Marlowe Shores, [place]      . DISCONTD: HYDROmorphone (DILAUDID) injection 0.25-0.5 mg  0.25-0.5 mg Intravenous Q5 min PRN Melonie Florida, MD   0.25 mg at 11/14/10 1423  . DISCONTD: ipratropium (ATROVENT) nebulizer solution 0.5 mg  0.5 mg Nebulization Q6H Leslye Peer, MD   0.5 mg at 11/16/10 0811  . DISCONTD: ipratropium (ATROVENT) nebulizer solution 0.5 mg  0.5 mg Nebulization Q6H Leslye Peer, MD   0.5 mg at 11/18/10 0852  . DISCONTD: labetalol (NORMODYNE,TRANDATE) injection 10 mg  10 mg Intravenous Q2H PRN Marlowe Shores, [place]      . DISCONTD: meperidine (DEMEROL)  injection 6.25-12.5 mg  6.25-12.5 mg Intravenous PRN Melonie Florida, MD      . DISCONTD: metoprolol (LOPRESSOR) injection 2-5 mg  2-5 mg Intravenous Q2H PRN Marlowe Shores, [place]      . DISCONTD: metoprolol (LOPRESSOR) injection 5 mg  5 mg Intravenous Q6H Marlowe Shores, PA   2.5 mg at 11/15/10 1833  . DISCONTD: midazolam (VERSED) injection 2-4 mg  2-4 mg Intravenous Q2H PRN Max Fickle, MD   4 mg at 11/17/10 0010  . DISCONTD: morphine 1 MG/ML PCA injection   Intravenous Q4H Amelia Jo Roczniak, PA   10.5 mg at 11/15/10 1600  . DISCONTD: morphine 2 MG/ML injection 2-5 mg  2-5 mg Intravenous Q1H PRN Marlowe Shores, PA   2 mg at 11/14/10 2102  . DISCONTD: morphine 4 MG/ML injection 2-5 mg  2-5 mg Intravenous Q1H PRN Larina Earthly, MD   4 mg at 11/15/10 0442  . DISCONTD: naloxone [facility]) injection 0.4 mg  0.4 mg Intravenous PRN Marlowe Shores, PA      . DISCONTD: ondansetron [facility]) injection 4 mg  4 mg  Intravenous Q6H PRN Marlowe Shores, PA      . DISCONTD: ondansetron (ZOFRAN) injection 4 mg  4 mg Intravenous Once PRN Melonie Florida, MD      . DISCONTD: ondansetron [facility]) injection 4 mg  4 mg Intravenous Q6H PRN Marlowe Shores, PA      . DISCONTD: potassium chloride 20 MEQ/15ML (10%) liquid 40 mEq  40 mEq Oral Daily Marlowe Shores, [place]      . DISCONTD: potassium chloride 20 MEQ/15ML (10%) liquid           . DISCONTD: sodium chloride 0.9 % bolus 1,000 mL  1,000 mL Intravenous Once Kalman Shan, MD      . DISCONTD: sodium chloride 0.9 % irrigation    PRN Larina Earthly, MD   2,000 mL at 11/14/10 0954   Medications Prior to Admission  Medication Sig Dispense Refill  . albuterol (VENTOLIN HFA) 108 (90 BASE) MCG/ACT inhaler Inhale 2 puffs into the lungs every 4 (four) hours as needed. For shortness of breath      . ALPRAZolam (XANAX) 0.25 MG tablet Take 0.25 mg by mouth 3 (three) times daily as needed. For anxiety       . aspirin 81 MG tablet Take 81 mg by mouth daily.       . citalopram (CELEXA) 20 MG tablet Take 20 mg by mouth daily.        . fludrocortisone (FLORINEF) 0.1 MG tablet Take 0.1 mg by mouth daily.       Marland Kitchen loratadine (CLARITIN) 10 MG tablet Take 10 mg by mouth daily.       . Tamsulosin HCl (FLOMAX) 0.4 MG CAPS Take 0.4 mg by mouth daily after supper.        . traMADol (ULTRAM) 50 MG tablet Take 100 mg by mouth every 8 (eight) hours as needed. For pain       . zolpidem (AMBIEN) 5 MG tablet Take 1 tablet (5 mg total) by mouth at bedtime as needed for sleep.  30 tablet  0    Allergies  Allergen Reactions  . Lyrica (Pregabalin)     unknown    Past Medical History  Diagnosis Date  . Hypertension   . Prostate enlargement   . History of chicken pox     childhood  . GERD (gastroesophageal reflux disease)   .  Arthritis     knee and back  . AAA (abdominal aortic aneurysm)     requested stress test from   . Anxiety     Past Surgical History  Procedure  Date  . Knee surgery 1957    left knee arthotomy    History   Social History  . Marital Status: Married    Spouse Name: N/A    Number of Children: N/A  . Years of Education: N/A   Occupational History  . Not on file.   Social History Main Topics  . Smoking status: Former Smoker    Types: Cigarettes    Quit date: 07/30/2010  . Smokeless tobacco: Never Used   Comment: has not smoked since 07/30/2010  . Alcohol Use: No  . Drug Use: No  . Sexually Active: Not on file   Other Topics Concern  . Not on file   Social History Narrative  . No narrative on file    Family History  Problem Relation Age of Onset  . Arthritis Mother   . Heart failure Mother   . Hypertension Mother   . Diabetes Mother   . Heart failure Daughter     deceased age 26  . Other Daughter     deceased age 38 MVA    ROS: Some abdominal pain following surgery but no fevers or chills, productive cough, hemoptysis, dysphasia, odynophagia, melena, hematochezia, dysuria, hematuria, rash, seizure activity, orthopnea, PND, pedal edema, claudication. Remaining systems are negative.  Physical Exam:  Blood pressure 102/72, pulse 67, temperature 99 F (37.2 C), temperature source Oral, resp. rate 12, height 5\' 9"  (1.753 m), weight 209 lb 10.5 oz (95.1 kg), SpO2 93.00%.  General:  Well developed/well nourished in NAD Skin warm/dry Patient not depressed No peripheral clubbing Back-normal HEENT-normal/normal eyelids Neck supple/normal carotid upstroke bilaterally; no bruits; no JVD; no thyromegaly; right IJ in place chest - diminished BS throughout CV - irregular/normal S1 and S2; no murmurs, rubs or gallops;  PMI nondisplaced Abdomen -S/P abdominal surgery; incision without evidence of infection; mild tenderness no HSM, no mass, + bowel sounds, no bruit 2+ femoral pulses, no bruits Ext-no edema, chords, diminished distal pulses Neuro-grossly nonfocal   Results for orders placed during the hospital  encounter of 11/14/10 (from the past 48 hour(s))  CBC     Status: Abnormal   Collection Time   11/18/10  4:30 AM      Component Value Range Comment   WBC 10.4  4.0 - 10.5 (K/uL)    RBC 3.34 (*) 4.22 - 5.81 (MIL/uL)    Hemoglobin 10.5 (*) 13.0 - 17.0 (g/dL)    HCT 40.9 (*) 81.1 - 52.0 (%)    MCV 96.1  78.0 - 100.0 (fL)    MCH 31.4  26.0 - 34.0 (pg)    MCHC 32.7  30.0 - 36.0 (g/dL)    RDW 91.4  78.2 - 95.6 (%)    Platelets 113 (*) 150 - 400 (K/uL) CONSISTENT WITH PREVIOUS RESULT  BASIC METABOLIC PANEL     Status: Abnormal   Collection Time   11/18/10  4:30 AM      Component Value Range Comment   Sodium 139  135 - 145 (mEq/L)    Potassium 2.9 (*) 3.5 - 5.1 (mEq/L)    Chloride 105  96 - 112 (mEq/L)    CO2 26  19 - 32 (mEq/L)    Glucose, Bld 90  70 - 99 (mg/dL)    BUN 18  6 -  23 (mg/dL)    Creatinine, Ser 1.61  0.50 - 1.35 (mg/dL)    Calcium 8.7  8.4 - 10.5 (mg/dL)    GFR calc non Af Amer 82 (*) >90 (mL/min)    GFR calc Af Amer >90  >90 (mL/min)   CBC     Status: Abnormal   Collection Time   11/19/10  4:08 AM      Component Value Range Comment   WBC 7.6  4.0 - 10.5 (K/uL)    RBC 2.85 (*) 4.22 - 5.81 (MIL/uL)    Hemoglobin 9.0 (*) 13.0 - 17.0 (g/dL)    HCT 09.6 (*) 04.5 - 52.0 (%)    MCV 95.8  78.0 - 100.0 (fL)    MCH 31.6  26.0 - 34.0 (pg)    MCHC 33.0  30.0 - 36.0 (g/dL)    RDW 40.9  81.1 - 91.4 (%)    Platelets 114 (*) 150 - 400 (K/uL) CONSISTENT WITH PREVIOUS RESULT  BASIC METABOLIC PANEL     Status: Abnormal   Collection Time   11/19/10  4:08 AM      Component Value Range Comment   Sodium 138  135 - 145 (mEq/L)    Potassium 2.9 (*) 3.5 - 5.1 (mEq/L)    Chloride 102  96 - 112 (mEq/L)    CO2 28  19 - 32 (mEq/L)    Glucose, Bld 95  70 - 99 (mg/dL)    BUN 14  6 - 23 (mg/dL)    Creatinine, Ser 7.82  0.50 - 1.35 (mg/dL)    Calcium 8.6  8.4 - 10.5 (mg/dL)    GFR calc non Af Amer 85 (*) >90 (mL/min)    GFR calc Af Amer >90  >90 (mL/min)     Dg Chest Portable 1  View  11/18/2010  *RADIOLOGY REPORT*  Clinical Data: Evaluate lines.  PORTABLE CHEST - 1 VIEW  Comparison: 11/17/2010  Findings: Removal of endotracheal tube and nasogastric tube. Right jugular sheath is still in place.  Patchy densities at the lung bases are most compatible with atelectasis.  No evidence for a large pneumothorax.  Heart size is stable.  IMPRESSION: Basilar lung densities are most compatible with atelectasis.  Removal of support apparatuses as described.  Original Report Authenticated By: Richarda Overlie, M.D.    Assessment/Plan Patient Active Hospital Problem List: COPD (chronic obstructive pulmonary disease) (08/14/2010) Management per primary service Aortic aneurysm (08/29/2010)  management per vascular surgery.  Hypertension (11/15/2010) Discontinue amlodipine and increase metoprolol for rate control.  Atrial fibrillation (11/19/2010)  Patient has developed postoperative atrial fibrillation. He is asymptomatic with no chest pain, shortness of breath or palpitations. Recent echocardiogram showed normal LV function. Check TSH. Increase metoprolol to 25 mg by mouth twice a day for rate control. Continue aspirin. If he does not convert on his own he will require short-term Coumadin. His blood pressure is borderline. Discontinue Norvasc. If his blood pressure continues to be a problem after discontinuing Norvasc then we may need to consider amiodarone for rate control.   Olga Millers MD 11/19/2010, 10:33 AM

## 2010-11-19 NOTE — Progress Notes (Signed)
Follow up - Critical Care Medicine Note  Patient Details:    Steve [name] is an 77 y.o. male. Brief history 77 y/o male with a AAA and COPD underwent an open AAA repair 11/14/10. PCCM consulted for post operative respiratory acidosis and hypoxemia. Reintubated for progressive resp failure and poor secretion management Lines/tubes:  11/14/10 PA-C >> 11/6 11/14/10 R IJ cordis >>  11/14/10 L radial a line>> 11/6 11/15/10 ETT >> 11/8 Microbiology/Sepsis markers:  Sputum 11/6 >> Anti-infectives:  Cefuroxime (post op) >> 11/7 Best Practice/Protocols:  SCD's  Pepcid  Consults:  Vascular SGY  Studies/events:  11/14/10 Open AAA repair 11/16/10 TTE >> normal LV fxn  Subjective:    Overnight Issues: Tolerating clears, OOB to chair, looks comfortable. Went into A Fib last night, HR 100 - unclear from past notes whether he has ever had before  Objective:  Vital signs  Temp:  [97.7 F (36.5 C)-99 F (37.2 C)] 99 F (37.2 C) (11/10 0800) Pulse Rate:  [67-104] 67  (11/10 0800) Resp:  [12-26] 12  (11/10 0800) BP: (102-148)/(53-72) 102/72 mmHg (11/10 0800) SpO2:  [91 %-98 %] 93 % (11/10 0600) Weight:  [95.1 kg (209 lb 10.5 oz)] 209 lb 10.5 oz (95.1 kg) (11/10 0600)  Hemodynamic parameters for last 24 hours:     Intake/Output Summary (Last 24 hours) at 11/19/10 0903 Last data filed at 11/19/10 0600  Gross per 24 hour  Intake 1169.67 ml  Output   3365 ml  Net -2195.33 ml    Physical Exam: General: chronically ill appearing, up to chair, much improved, stronger HEENT: mm moist, PERRL Neuro: awake interacting normally, much stronger Lungs: resps even non labored, distant BS without wheeze Cards: s1s2 rrr, no m/r/g  Abd: soft, round, non tender, hypoactive BS  Ext: warm and dry, no edema, SCD in place  Ventilator  Settings:  LAB RESULTS BMET    Component Value Date/Time   NA 138 11/19/2010 0408   K 2.9* 11/19/2010 0408   CL 102 11/19/2010 0408   CO2 28 11/19/2010 0408   GLUCOSE 95 11/19/2010 0408   BUN 14 11/19/2010 0408   CREATININE 0.80 11/19/2010 0408   CREATININE 1.16 09/16/2010 1514   CALCIUM 8.6 11/19/2010 0408   GFRNONAA 85* 11/19/2010 0408   GFRAA >90 11/19/2010 0408   Lab Results  Component Value Date   WBC 7.6 11/19/2010   HGB 9.0* 11/19/2010   HCT 27.3* 11/19/2010   MCV 95.8 11/19/2010   PLT 114* 11/19/2010   ABG    Component Value Date/Time   PHART 7.425 11/17/2010 0509   HCO3 26.7* 11/17/2010 0509   TCO2 28 11/17/2010 0509   ACIDBASEDEF 2.0 11/16/2010 0501   O2SAT 96.0 11/17/2010 0509    Additional lab data  Radiology CXR: Dg Chest Portable 1 View  11/18/2010  *RADIOLOGY REPORT*  Clinical Data: Evaluate lines.  PORTABLE CHEST - 1 VIEW  Comparison: 11/17/2010  Findings: Removal of endotracheal tube and nasogastric tube. Right jugular sheath is still in place.  Patchy densities at the lung bases are most compatible with atelectasis.  No evidence for a large pneumothorax.  Heart size is stable.  IMPRESSION: Basilar lung densities are most compatible with atelectasis.  Removal of support apparatuses as described.  Original Report Authenticated By: Richarda Overlie, M.D.      Assessment/Plan:   LOS: 5 days   Impression/Plans:  1. Aortic aneurysm, s/p surgical repair 11/5  - follow CBC - PT/OT - prn fentanyl for pain control  2. Acute and chronic respiratory failure, due to poor abdominal compliance, narcotics, superimposed on known COPD. Much improved extubated 11/8. Chest x-ray with some residual atelectasis - change Combivent to scheduled albuterol and Atrovent nebulizers - mild pulm edema pattern on CXR 11/7, improved some on 11/8. Lasix given on 11/6 - 11/9. Suspect he is euvolemic. - would remove Cordis  3. COPD (chronic obstructive pulmonary disease). Was only on prn albuterol at home. Will need a more consistent scheduled bronchodilator regimen at the time of discharge. - change albuterol and ipratropium nebulizers to 4 times a day  instead of every 6 hours - consider change to long-acting meds prior to discharge  4. HTN. BP at goal at this time. Echocardiogram with normal LV function - Restart home norvasc on 11/10 if OK with cardiology after their evaluation - would recommend holding further diuresis, he does not appear to be overtly volume overloaded.  5. Atrial Fib. Appears to be new, likely stress response from sgy and also due to hypokalemia.  - cards consulted to eval by Dr Darrick Penna 11/10 - metoprolol added 11/10  5. Depression/anxiety  - restarted home celexa 11/9 - was on xanax at home, watch for withdrawal sx once sedation lifted  6. Hypokalemia - replace, give an additional (IV KCl ordered by [facility])  Additional comments: None  BYRUM,ROBERT S. 11/19/2010  *Care during the described time interval was provided by me and/or other providers on the critical care team.  I have reviewed this patient's available data, including medical history, events of note, physical examination and test results as part of my evaluation.

## 2010-11-19 NOTE — Progress Notes (Signed)
eLink Physician-Brief Progress Note Patient Name: Steve [name] DOB: 1933-06-30 MRN: [id number]  Date of Service  11/19/2010   HPI/Events of Note  tachycardia   eICU Interventions  Prn Lopressor for HR greater than 130    DETERDING,ELIZABETH 11/19/2010, 4:53 AM

## 2010-11-19 NOTE — Progress Notes (Addendum)
[facility] [place]  Post-op  OPEN AAA Repair  Date of Surgery: 11/14/2010 Surgeon: Surgeon(s): Larina Earthly, MD POD: 5 Days Post-Op  History of Present Illness  Steve [name] is a 77 y.o. male who is  up s/p Open repair of Abdominal Aortic Aneurysm . Pt is doing well. denies incisional pain; denies nausea/vomiting; denies diarrhea. has had flatus;has had BM  Dg Chest Portable 1 View  11/18/2010  *RADIOLOGY REPORT*  Clinical Data: Evaluate lines.  PORTABLE CHEST - 1 VIEW  Comparison: 11/17/2010  Findings: Removal of endotracheal tube and nasogastric tube. Right jugular sheath is still in place.  Patchy densities at the lung bases are most compatible with atelectasis.  No evidence for a large pneumothorax.  Heart size is stable.  IMPRESSION: Basilar lung densities are most compatible with atelectasis.  Removal of support apparatuses as described.  Original Report Authenticated By: Richarda Overlie, M.D.    Significant Diagnostic Studies: CBC    Component Value Date/Time   WBC 7.6 11/19/2010 0408   RBC 2.85* 11/19/2010 0408   HGB 9.0* 11/19/2010 0408   HCT 27.3* 11/19/2010 0408   PLT 114* 11/19/2010 0408   MCV 95.8 11/19/2010 0408   MCH 31.6 11/19/2010 0408   MCHC 33.0 11/19/2010 0408   RDW 14.0 11/19/2010 0408   LYMPHSABS 1.6 09/06/2010 2101   MONOABS 0.7 09/06/2010 2101   EOSABS 0.2 09/06/2010 2101   BASOSABS 0.0 09/06/2010 2101    BMET    Component Value Date/Time   NA 138 11/19/2010 0408   K 2.9* 11/19/2010 0408   CL 102 11/19/2010 0408   CO2 28 11/19/2010 0408   GLUCOSE 95 11/19/2010 0408   BUN 14 11/19/2010 0408   CREATININE 0.80 11/19/2010 0408   CREATININE 1.16 09/16/2010 1514   CALCIUM 8.6 11/19/2010 0408   GFRNONAA 85* 11/19/2010 0408   GFRAA >90 11/19/2010 0408    COAG Lab Results  Component Value Date   INR 1.06 11/14/2010   INR 1.01 11/10/2010   INR 1.03 08/01/2010   No results found for this basename: PTT    I/O last 3 completed  shifts: In: 1949.7 [P.O.:850; I.V.:549.7; IV Piggyback:550] Out: 4810 [Urine:4810]    NGT Drainage:No data found.   Physical Examination Patient Vitals for the past 24 hrs:  BP Temp Temp src Pulse Resp SpO2 Weight  11/19/10 0600 111/57 mmHg - - 91  17  93 % 209 lb 10.5 oz (95.1 kg)  11/19/10 0500 127/65 mmHg - - 104  20  92 % -  11/19/10 0400 134/56 mmHg - - 67  16  92 % -  11/19/10 0300 139/62 mmHg - - 69  18  93 % -  11/19/10 0200 141/59 mmHg - - 70  22  92 % -  11/19/10 0100 135/61 mmHg - - 71  17  94 % -  11/18/10 2300 137/60 mmHg - - 72  20  95 % -  11/18/10 2200 137/60 mmHg - - 73  21  95 % -  11/18/10 2100 - - - 82  22  98 % -  11/18/10 2000 140/57 mmHg 98.5 F (36.9 C) Oral 78  26  93 % -  11/18/10 1918 - - - 72  23  93 % -  11/18/10 1900 137/54 mmHg - - 72  22  92 % -  11/18/10 1800 135/62 mmHg - - 70  20  93 % -  11/18/10 1731 - - - - -  92 % -  11/18/10 1700 135/61 mmHg - - 70  21  93 % -  11/18/10 1600 133/58 mmHg 98.4 F (36.9 C) Oral 69  16  91 % -  11/18/10 1500 120/58 mmHg - - 76  20  92 % -  11/18/10 1400 125/60 mmHg - - 74  21  92 % -  11/18/10 1322 - - - - - 94 % -  11/18/10 1300 146/61 mmHg - - 71  22  91 % -  11/18/10 1200 127/67 mmHg 97.7 F (36.5 C) Oral 75  25  94 % -  11/18/10 1100 140/64 mmHg - - 70  19  94 % -  11/18/10 1000 127/56 mmHg - - 74  22  95 % -  11/18/10 0900 133/55 mmHg - - 68  18  99 % -  11/18/10 0852 - - - - - 96 % -    O2 Sats 96 % on Nasal cannula (liters/minute);  2 LPM  General: A&O x 3, WDWN male in NAD Pulmonary: normal non-labored breathing , without Rales, rhonchi,  wheezing Cardiac: Heart rate : irregular , without  Murmurs,  Abdomen:abdomen soft, non-tender and normal active bowel sounds Abdominal wound:clean, dry, intact  Neurologic: A&O X 3; Appropriate Affect ; SENSATION: normal; MOTOR FUNCTION:  moving all extremities equally. Speech is fluent/normal  Vascular Exam:BLE warm and well perfused Extremities without  ischemic changes, no Gangrene , no cellulitis; no open wounds;   LOWER EXTREMITY PULSES           RIGHT                                      LEFT      POSTERIOR TIBIAL palpable palpable       DORSALIS PEDIS      ANTERIOR TIBIAL palpable doppler    Assessment: Steve [name] is a 77 y.o. male who is 5 days s/p  Open repair of Abdominal Aortic Aneurysm . Mild disorientation BPH - pt with indwelling catheter at home Deconditioned hypokalemic  Plan: Transfer to floor Advance diet - soft, ensure Ambulate Replace K+         Pt with new onset Afib.  No prior history probably secondary to electrolyte derangements.  Will have Dr. Antoine Poche evaluate. Continue to advance diet  Fabienne Bruns, MD [facility] [place] Office: [phone] Pager: [phone]

## 2010-11-20 DIAGNOSIS — R0902 Hypoxemia: Secondary | ICD-10-CM

## 2010-11-20 DIAGNOSIS — J449 Chronic obstructive pulmonary disease, unspecified: Secondary | ICD-10-CM

## 2010-11-20 DIAGNOSIS — I714 Abdominal aortic aneurysm, without rupture: Secondary | ICD-10-CM

## 2010-11-20 LAB — GLUCOSE, CAPILLARY

## 2010-11-20 LAB — BASIC METABOLIC PANEL
BUN: 17 mg/dL (ref 6–23)
CO2: 24 mEq/L (ref 19–32)
Chloride: 107 mEq/L (ref 96–112)
GFR calc Af Amer: >90 mL/min (ref >90)
Glucose, Bld: 97 mg/dL (ref 70–99)
Potassium: 4.1 mEq/L (ref 3.5–5.1)

## 2010-11-20 LAB — CBC
HCT: 29.8 % — ABNORMAL LOW (ref 39.0–52.0)
Hemoglobin: 9.8 g/dL — ABNORMAL LOW (ref 13.0–17.0)
WBC: 9.6 10*3/uL (ref 4.0–10.5)

## 2010-11-20 MED ORDER — TIOTROPIUM BROMIDE MONOHYDRATE 18 MCG IN CAPS
18.0000 ug | ORAL_CAPSULE | Freq: Every day | RESPIRATORY_TRACT | Status: DC
  Filled 2010-11-20: qty 5

## 2010-11-20 MED ORDER — ALBUTEROL SULFATE (5 MG/ML) 0.5% IN NEBU: 2.5000 mg | INHALATION_SOLUTION | RESPIRATORY_TRACT | Status: DC | PRN

## 2010-11-20 NOTE — Progress Notes (Signed)
Follow up - Critical Care Medicine Note  Patient Details:    Steve [name] is an 77 y.o. male. Brief history 77 y/o male with a AAA and COPD underwent an open AAA repair 11/14/10. PCCM consulted for post operative respiratory acidosis and hypoxemia. Reintubated for progressive resp failure and poor secretion management Lines/tubes:  11/14/10 PA-C >> 11/6 11/14/10 R IJ cordis >> 11/10 11/14/10 L radial a line>> 11/6 11/15/10 ETT >> 11/8 Microbiology/Sepsis markers:  Sputum 11/6 >> Anti-infectives:  Cefuroxime (post op) >> 11/7 Best Practice/Protocols:  SCD's  Pepcid  Consults:  Vascular SGY  Studies/events:  11/14/10 Open AAA repair 11/16/10 TTE >> normal LV fxn  Subjective:    Overnight Issues: Improving, back in NSR. No dyspnea. Has been on scheduled DuoNebs since admission, only on albuterol prn at home.   Objective:  Vital signs  Temp:  [97.8 F (36.6 C)-99.4 F (37.4 C)] 99.4 F (37.4 C) (11/11 0354) Pulse Rate:  [61-98] 62  (11/11 0830) Resp:  [12-20] 20  (11/11 0354) BP: (101-134)/(53-76) 134/56 mmHg (11/11 0354) SpO2:  [90 %-98 %] 90 % (11/11 0830) Weight:  [87.408 kg (192 lb 11.2 oz)] 192 lb 11.2 oz (87.408 kg) (11/11 0354)  Hemodynamic parameters for last 24 hours:     Intake/Output Summary (Last 24 hours) at 11/20/10 1111 Last data filed at 11/20/10 1007  Gross per 24 hour  Intake    370 ml  Output    625 ml  Net   -255 ml    Physical Exam: General: chronically ill appearing, up to chair, much improved, stronger HEENT: mm moist, PERRL Neuro: awake interacting normally, much stronger Lungs: resps even non labored, distant BS without wheeze Cards: s1s2 rrr, no m/r/g  Abd: soft, round, non tender, hypoactive BS  Ext: warm and dry, no edema, SCD in place  Ventilator  Settings:  LAB RESULTS BMET    Component Value Date/Time   NA 140 11/20/2010 0545   K 4.1 11/20/2010 0545   CL 107 11/20/2010 0545   CO2 24 11/20/2010 0545   GLUCOSE 97  11/20/2010 0545   BUN 17 11/20/2010 0545   CREATININE 0.98 11/20/2010 0545   CREATININE 1.16 09/16/2010 1514   CALCIUM 9.0 11/20/2010 0545   GFRNONAA 78* 11/20/2010 0545   GFRAA >90 11/20/2010 0545   Lab Results  Component Value Date   WBC 9.6 11/20/2010   HGB 9.8* 11/20/2010   HCT 29.8* 11/20/2010   MCV 96.4 11/20/2010   PLT 147* 11/20/2010   ABG    Component Value Date/Time   PHART 7.425 11/17/2010 0509   HCO3 26.7* 11/17/2010 0509   TCO2 28 11/17/2010 0509   ACIDBASEDEF 2.0 11/16/2010 0501   O2SAT 96.0 11/17/2010 0509    Additional lab data  Radiology CXR: No results found.    Assessment/Plan:   LOS: 6 days   Impression/Plans:  1. Aortic aneurysm, s/p surgical repair 11/5  - hopefully ready for discharge soon - PT/OT - prn fentanyl for pain control  2. Acute and chronic respiratory failure, due to poor abdominal compliance, narcotics, superimposed on known COPD. Much improved, extubated 11/8. Chest x-ray with some residual atelectasis - change DuoNebs to Spiriva qd + albuterol prn. This will be home regimen. I would like to see him in my office after discharge to follow his COPD - mild pulm edema pattern on CXR 11/7, improved some on 11/8. Lasix given on 11/6 - 11/9. Suspect he is euvolemic.  3. COPD (chronic obstructive pulmonary  disease). Was only on prn albuterol at home. Will need a more consistent scheduled bronchodilator regimen at the time of discharge. - Start Spiriva as above  4. HTN. BP at goal at this time. Echocardiogram with normal LV function - holding further diuresis, he does not appear to be overtly volume overloaded. - metoprolol started this hospitalization,  holding off on home norvasc  5. Atrial Fib. Appears to be new, likely stress response from sgy and also due to hypokalemia. Back in NSR this am 11/11 - metoprolol added 11/10  5. Depression/anxiety  - restarted home celexa 11/9 - was on xanax at home, watch for withdrawal sx once sedation  lifted  6. Hypokalemia, corrected  Additional comments: None  [name] 11/20/2010  *Care during the described time interval was provided by me and/or other providers on the critical care team.  I have reviewed this patient's available data, including medical history, events of note, physical examination and test results as part of my evaluation.

## 2010-11-20 NOTE — Progress Notes (Signed)
  [facility] [place]  Subjective  - Improving daily   Objective 134/56 62 99.4 F (37.4 C) (Oral) 20 90%  Intake/Output Summary (Last 24 hours) at 11/20/10 1156 Last data filed at 11/20/10 1007  Gross per 24 hour  Intake    370 ml  Output    625 ml  Net   -255 ml   Incision healing  Feet warm Sinus rhythm  Assessment/Planning: S/p AAA repair appreciate Pulmonary and Cardiology input Advance diet possible d/c tomorrow  Steve [name],Steve [name] 11/20/2010 11:56 AM --  Laboratory Lab Results:  [facility] 11/20/10 0545 11/19/10 0408  WBC 9.6 7.6  HGB 9.8* 9.0*  HCT 29.8* 27.3*  PLT 147* 114*   BMET  Basename 11/20/10 0545 11/19/10 0408  NA 140 138  K 4.1 2.9*  CL 107 102  CO2 24 28  GLUCOSE 97 95  BUN 17 14  CREATININE 0.98 0.80  CALCIUM 9.0 8.6    COAG Lab Results  Component Value Date   INR 1.06 11/14/2010   INR 1.01 11/10/2010   INR 1.03 08/01/2010   No results found for this basename: PTT    Antibiotics Anti-infectives     Start     Dose/Rate Route Frequency Ordered Stop   11/14/10 2200   cefUROXime (ZINACEF) 1.5 g in dextrose 5 % 50 mL IVPB        1.5 g 100 mL/hr over 30 Minutes Intravenous Every 12 hours 11/14/10 1658 11/15/10 1048   11/13/10 1415   cefUROXime (ZINACEF) 1.5 g in dextrose 5 % 50 mL IVPB  Status:  Discontinued        1.5 g 100 mL/hr over 30 Minutes Intravenous 60 min pre-op 11/13/10 1413 11/14/10 1201

## 2010-11-20 NOTE — Progress Notes (Signed)
SUBJECTIVE:  No chest pain.  No SOB   PHYSICAL EXAM Filed Vitals:   11/19/10 1900 11/19/10 2115 11/20/10 0354 11/20/10 0830  BP:  123/56 134/56   Pulse:  71 67 62  Temp:  98.9 F (37.2 C) 99.4 F (37.4 C)   TempSrc:  Oral Oral   Resp:  18 20   Height:      Weight:   87.408 kg (192 lb 11.2 oz)   SpO2: 95% 95% 93% 90%   General:  No distress Lungs:  Few basilar crackles Heart:  RRR Abdomen:  Positive bowel sounds, no rebound no guarding, large healing scar Extremities:  No edema  LABS: Lab Results  Component Value Date   CKTOTAL 626* 11/15/2010   CKMB 13.8* 11/15/2010   TROPONINI <0.30 11/15/2010   Results for orders placed during the hospital encounter of 11/14/10 (from the past 24 hour(s))  GLUCOSE, CAPILLARY     Status: Abnormal   Collection Time   11/19/10  9:44 PM      Component Value Range   Glucose-Capillary 101 (*) 70 - 99 (mg/dL)   Comment 1 Documented in Chart     Comment 2 Notify RN    GLUCOSE, CAPILLARY     Status: Normal   Collection Time   11/20/10  5:43 AM      Component Value Range   Glucose-Capillary 81  70 - 99 (mg/dL)   Comment 1 Documented in Chart     Comment 2 Notify RN    CBC     Status: Abnormal   Collection Time   11/20/10  5:45 AM      Component Value Range   WBC 9.6  4.0 - 10.5 (K/uL)   RBC 3.09 (*) 4.22 - 5.81 (MIL/uL)   Hemoglobin 9.8 (*) 13.0 - 17.0 (g/dL)   HCT 16.1 (*) 09.6 - 52.0 (%)   MCV 96.4  78.0 - 100.0 (fL)   MCH 31.7  26.0 - 34.0 (pg)   MCHC 32.9  30.0 - 36.0 (g/dL)   RDW 04.5  40.9 - 81.1 (%)   Platelets 147 (*) 150 - 400 (K/uL)  BASIC METABOLIC PANEL     Status: Abnormal   Collection Time   11/20/10  5:45 AM      Component Value Range   Sodium 140  135 - 145 (mEq/L)   Potassium 4.1  3.5 - 5.1 (mEq/L)   Chloride 107  96 - 112 (mEq/L)   CO2 24  19 - 32 (mEq/L)   Glucose, Bld 97  70 - 99 (mg/dL)   BUN 17  6 - 23 (mg/dL)   Creatinine, Ser 9.14  0.50 - 1.35 (mg/dL)   Calcium 9.0  8.4 - 78.2 (mg/dL)   GFR calc non Af  Amer 78 (*) >90 (mL/min)   GFR calc Af Amer >90  >90 (mL/min)    Intake/Output Summary (Last 24 hours) at 11/20/10 1009 Last data filed at 11/20/10 1007  Gross per 24 hour  Intake    420 ml  Output    625 ml  Net   -205 ml    ASSESSMENT AND PLAN:  Active Problems:  COPD (chronic obstructive pulmonary disease)  Depression  Aortic aneurysm  Acute and chronic respiratory failure  Hypertension  Atrial fibrillation    Atrial fib:  Back in NSR.  No change in therapy.  Hypertension:  BP is still labile.  Holding amlodipine but low dose beta blocker is OK. Fayrene Fearing [facility] 11/20/2010 10:09 AM

## 2010-11-21 ENCOUNTER — Ambulatory Visit (INDEPENDENT_AMBULATORY_CARE_PROVIDER_SITE_OTHER): Payer: Medicare HMO | Admitting: Adult Health

## 2010-11-21 ENCOUNTER — Telehealth: Payer: Self-pay | Admitting: Emergency Medicine

## 2010-11-21 DIAGNOSIS — J449 Chronic obstructive pulmonary disease, unspecified: Secondary | ICD-10-CM

## 2010-11-21 DIAGNOSIS — R0902 Hypoxemia: Secondary | ICD-10-CM

## 2010-11-21 DIAGNOSIS — I714 Abdominal aortic aneurysm, without rupture: Secondary | ICD-10-CM

## 2010-11-21 LAB — CBC
MCV: 95.2 fL (ref 78.0–100.0)
Platelets: 171 10*3/uL (ref 150–400)
RBC: 3.13 MIL/uL — ABNORMAL LOW (ref 4.22–5.81)
WBC: 9.9 10*3/uL (ref 4.0–10.5)

## 2010-11-21 LAB — BASIC METABOLIC PANEL
CO2: 26 mEq/L (ref 19–32)
Chloride: 105 mEq/L (ref 96–112)
Potassium: 3.5 mEq/L (ref 3.5–5.1)
Sodium: 139 mEq/L (ref 135–145)

## 2010-11-21 MED ORDER — METOPROLOL TARTRATE 25 MG PO TABS: 25.0000 mg | ORAL_TABLET | Freq: Two times a day (BID) | ORAL | Status: DC

## 2010-11-21 MED ORDER — TRAMADOL HCL 50 MG PO TABS: 50.0000 mg | ORAL_TABLET | Freq: Three times a day (TID) | ORAL | Status: AC | PRN

## 2010-11-21 NOTE — Progress Notes (Signed)
Follow up - Pulmonary/Critical Care Medicine Note  Patient Details:    Steve [name] is an 77 y.o. male. Brief history 76 y/o male with a AAA and COPD underwent an open AAA repair 11/14/10. PCCM consulted for post operative respiratory acidosis and hypoxemia. Reintubated for progressive resp failure and poor secretion management. Now following for COPD. Lines/tubes:  11/14/10 PA-C >> 11/6 11/14/10 R IJ cordis >> 11/10 11/14/10 L radial a line>> 11/6 11/15/10 ETT >> 11/8 Microbiology/Sepsis markers:  Sputum 11/6 >> Anti-infectives:  Cefuroxime (post op) >> 11/7 Best Practice/Protocols:  SCD's  Pepcid  Consults:  Vascular SGY  Studies/events:  11/14/10 Open AAA repair 11/16/10 TTE >> normal LV fxn  Subjective:    Overnight Issues: Started Spiriva 11/11  Objective:  Vital signs  Temp:  [97.3 F (36.3 C)-98.2 F (36.8 C)] 98.2 F (36.8 C) (11/11 2018) Pulse Rate:  [55-63] 63  (11/11 2018) Resp:  [18-20] 20  (11/11 2018) BP: (128-150)/(72-75) 150/75 mmHg (11/11 2018) SpO2:  [90 %-93 %] 91 % (11/11 2018)  Hemodynamic parameters for last 24 hours:     Intake/Output Summary (Last 24 hours) at 11/21/10 0817 Last data filed at 11/20/10 2021  Gross per 24 hour  Intake    480 ml  Output    900 ml  Net   -420 ml    Physical Exam: General: chronically ill appearing, up to chair, much improved, stronger HEENT: mm moist, PERRL Neuro: awake interacting normally, much stronger Lungs: resps even non labored, distant BS without wheeze Cards: s1s2 rrr, no m/r/g  Abd: soft, round, non tender, hypoactive BS  Ext: warm and dry, no edema, SCD in place  Ventilator  Settings:  LAB RESULTS BMET    Component Value Date/Time   NA 139 11/21/2010 0700   K 3.5 11/21/2010 0700   CL 105 11/21/2010 0700   CO2 26 11/21/2010 0700   GLUCOSE 88 11/21/2010 0700   BUN 14 11/21/2010 0700   CREATININE 0.93 11/21/2010 0700   CREATININE 1.16 09/16/2010 1514   CALCIUM 8.8 11/21/2010 0700   GFRNONAA 80* 11/21/2010 0700   GFRAA >90 11/21/2010 0700   Lab Results  Component Value Date   WBC 9.9 11/21/2010   HGB 9.9* 11/21/2010   HCT 29.8* 11/21/2010   MCV 95.2 11/21/2010   PLT 171 11/21/2010   ABG    Component Value Date/Time   PHART 7.425 11/17/2010 0509   HCO3 26.7* 11/17/2010 0509   TCO2 28 11/17/2010 0509   ACIDBASEDEF 2.0 11/16/2010 0501   O2SAT 96.0 11/17/2010 0509    Additional lab data  Radiology CXR: No results found.    Assessment/Plan:   LOS: 7 days   Impression/Plans:  1. Aortic aneurysm, s/p surgical repair 11/5  - hopefully ready for discharge today - PT/OT - prn fentanyl for pain control  2. Acute and chronic respiratory failure, due to poor abdominal compliance, narcotics, superimposed on known COPD. Much improved, extubated 11/8. Chest x-ray with some residual atelectasis - changed DuoNebs to Spiriva qd + albuterol prn. This will be home regimen. I would like to see him in my office after discharge to follow his COPD - mild pulm edema pattern on CXR 11/7, improved some on 11/8. Lasix given on 11/6 - 11/9. Suspect he is euvolemic.  3. COPD (chronic obstructive pulmonary disease). Was only on prn albuterol at home. Will need a more consistent scheduled bronchodilator regimen at the time of discharge. - Start Spiriva as above - Plan f/u outpt w  [name], [phone]  4. HTN. BP at goal at this time. Echocardiogram with normal LV function - holding further diuresis, he does not appear to be overtly volume overloaded. - metoprolol started this hospitalization,  holding off on home norvasc  5. Atrial Fib. Appears to be new, likely stress response from sgy and also due to hypokalemia. Back in NSR this am 11/11 - metoprolol added 11/10  5. Depression/anxiety  - restarted home celexa 11/9 - was on xanax at home, watch for withdrawal sx once sedation lifted  6. Hypokalemia, corrected  Additional comments: None  Metztli Sachdev S. 11/21/2010  *Care  during the described time interval was provided by me and/or other providers on the critical care team.  I have reviewed this patient's available data, including medical history, events of note, physical examination and test results as part of my evaluation.

## 2010-11-21 NOTE — Plan of Care (Signed)
Problem: Phase II Discharge Progression Outcomes Goal: Other Discharge Outcomes/Goals Outcome: Progressing PT recommending HHPT now for d/c, but patient politely declining.  He is pretty near his baseline of ambulation with RW (rollator) with wife's supervision.

## 2010-11-21 NOTE — Telephone Encounter (Signed)
Per Steve [name]- awaiting d/c summary to be faxed so we can be sure spiriva was prescribed.

## 2010-11-21 NOTE — Progress Notes (Signed)
See Discharge summary

## 2010-11-21 NOTE — Progress Notes (Signed)
SUBJECTIVE:  No chest pain.  No SOB. Ambulating room. Back in SR. 55-65   PHYSICAL EXAM Filed Vitals:   11/20/10 0354 11/20/10 0830 11/20/10 1419 11/20/10 2018  BP: 134/56  128/72 150/75  Pulse: 67 62 55 63  Temp: 99.4 F (37.4 C)  97.3 F (36.3 C) 98.2 F (36.8 C)  TempSrc: Oral  Oral Oral  Resp: 20  18 20   Height:      Weight: 87.408 kg (192 lb 11.2 oz)     SpO2: 93% 90% 93% 91%   General:  No distress Lungs:  Few basilar crackles. No wheeze Heart:  RRR Abdomen:  Positive bowel sounds, no rebound no guarding, large healing scar Extremities:  No edema  LABS: Lab Results  Component Value Date   CKTOTAL 626* 11/15/2010   CKMB 13.8* 11/15/2010   TROPONINI <0.30 11/15/2010   Results for orders placed during the hospital encounter of 11/14/10 (from the past 24 hour(s))  GLUCOSE, CAPILLARY     Status: Abnormal   Collection Time   11/20/10 11:15 AM      Component Value Range   Glucose-Capillary 104 (*) 70 - 99 (mg/dL)  CBC     Status: Abnormal   Collection Time   11/21/10  7:00 AM      Component Value Range   WBC 9.9  4.0 - 10.5 (K/uL)   RBC 3.13 (*) 4.22 - 5.81 (MIL/uL)   Hemoglobin 9.9 (*) 13.0 - 17.0 (g/dL)   HCT 04.5 (*) 40.9 - 52.0 (%)   MCV 95.2  78.0 - 100.0 (fL)   MCH 31.6  26.0 - 34.0 (pg)   MCHC 33.2  30.0 - 36.0 (g/dL)   RDW 81.1  91.4 - 78.2 (%)   Platelets 171  150 - 400 (K/uL)  BASIC METABOLIC PANEL     Status: Abnormal   Collection Time   11/21/10  7:00 AM      Component Value Range   Sodium 139  135 - 145 (mEq/L)   Potassium 3.5  3.5 - 5.1 (mEq/L)   Chloride 105  96 - 112 (mEq/L)   CO2 26  19 - 32 (mEq/L)   Glucose, Bld 88  70 - 99 (mg/dL)   BUN 14  6 - 23 (mg/dL)   Creatinine, Ser 9.56  0.50 - 1.35 (mg/dL)   Calcium 8.8  8.4 - 21.3 (mg/dL)   GFR calc non Af Amer 80 (*) >90 (mL/min)   GFR calc Af Amer >90  >90 (mL/min)    Intake/Output Summary (Last 24 hours) at 11/21/10 0829 Last data filed at 11/20/10 2021  Gross per 24 hour  Intake    480  ml  Output    900 ml  Net   -420 ml   Tele- SR  ASSESSMENT AND PLAN:  Active Problems:  COPD (chronic obstructive pulmonary disease)  Depression  Aortic aneurysm  Acute and chronic respiratory failure  Hypertension  Atrial fibrillation     Atrial fib:  Back in NSR.  No change in therapy.   Doing well post-op. Stable for d/c from cardiology perspective. Given COPD and bradycardia can consider stopping metoprolol started in hospital.   Arvilla Meres 11/21/2010 8:29 AM

## 2010-11-21 NOTE — Discharge Summary (Signed)
[facility] Discharge Summary   Patient ID:  Steve [name] MRN: [id number] DOB/AGE: Jun 04, 1933 77 y.o.  Admit date: 11/14/2010 Discharge date: 11/21/2010 Date of Surgery: 11/14/2010 Surgeon: Surgeon(s): Larina Earthly, MD  Admission Diagnosis: AAA See PMH  Discharge Diagnoses:  AAA Acute resp failure requiring re-intubation BPH with in dwelling catheter New onset A. Fib - controlled with Metoprolol  Secondary Diagnoses: Past Medical History  Diagnosis Date  . Hypertension   . Prostate enlargement   . History of chicken pox     childhood  . GERD (gastroesophageal reflux disease)   . Arthritis     knee and back  . AAA (abdominal aortic aneurysm)     requested stress test from [place]  . Anxiety     Procedures: Procedure(s): ANEURYSM ABDOMINAL AORTIC REPAIR  Discharged Condition: good  Hospital Course:  Steve [name] is a 77 y.o. male is S/P  ANEURYSM ABDOMINAL AORTIC REPAIR Extubated: POD # 0 BUT WAS REINTUBATED POD #2 X 2 DAYS FOR ACUTE RESP FAILURE He was extubated on the 4th POD Pt went into A. Fib and was started on Metoprolol Post-op wounds clean, dry, intact or healing well Abdomen soft, NABS, NT BLE with Palp PT pulses Pt. Ambulating, voiding and taking PO diet without difficulty. Pt pain controlled with PO pain meds. Labs as below  Complications:Acute resp. Failure;  New onset A. Fib  Consults:  Treatment Team:  Leslye Peer, MD  Significant Diagnostic Studies: CBC    Component Value Date/Time   WBC 9.9 11/21/2010 0700   RBC 3.13* 11/21/2010 0700   HGB 9.9* 11/21/2010 0700   HCT 29.8* 11/21/2010 0700   PLT 171 11/21/2010 0700   MCV 95.2 11/21/2010 0700   MCH 31.6 11/21/2010 0700   MCHC 33.2 11/21/2010 0700   RDW 14.0 11/21/2010 0700   LYMPHSABS 1.6 09/06/2010 2101   MONOABS 0.7 09/06/2010 2101   EOSABS 0.2 09/06/2010 2101   BASOSABS 0.0 09/06/2010 2101    BMET    Component Value Date/Time   NA 140 11/20/2010  0545   K 4.1 11/20/2010 0545   CL 107 11/20/2010 0545   CO2 24 11/20/2010 0545   GLUCOSE 97 11/20/2010 0545   BUN 17 11/20/2010 0545   CREATININE 0.98 11/20/2010 0545   CREATININE 1.16 09/16/2010 1514   CALCIUM 9.0 11/20/2010 0545   GFRNONAA 78* 11/20/2010 0545   GFRAA >90 11/20/2010 0545    COAG Lab Results  Component Value Date   INR 1.06 11/14/2010   INR 1.01 11/10/2010   INR 1.03 08/01/2010     Disposition:  Discharge to :Home Discharge Orders    Future Appointments: Provider: Department: Dept Phone: Center:   11/21/2010 2:00 PM Rubye Oaks, NP [facility] [phone] None   12/29/2010 1:15 PM Ala Dach Letitia Libra. [facility] [phone] [facility]     Future Orders Please Complete By Expires   Resume previous diet      Driving Restrictions      Comments:   No driving for 6 weeks   Lifting restrictions      Comments:   No lifting for 6 weeks   Call MD for:  temperature >100.5      Call MD for:  redness, tenderness, or signs of infection (pain, swelling, bleeding, redness, odor or green/yellow discharge around incision site)      Call MD for:  severe or increased pain, loss or decreased feeling  in affected limb(s)  Increase activity slowly      Comments:   Walk with assistance use walker or cane as needed   May walk up steps      May shower       may wash over wound with mild soap and water      No dressing needed      ABDOMINAL PROCEDURE/ANEURYSM REPAIR/AORTO-BIFEMORAL BYPASS:  Call MD for increased abdominal pain; cramping diarrhea; nausea/vomiting         [place], [place]  Home Medication Instructions [id number]   Printed on:11/21/10 0865  Medication Information                    Tamsulosin HCl (FLOMAX) 0.4 MG CAPS Take 0.4 mg by mouth daily after supper.             aspirin 81 MG tablet Take 81 mg by mouth daily.            albuterol (VENTOLIN HFA) 108 (90 BASE) MCG/ACT inhaler Inhale 2 puffs into the lungs every 4 (four) hours  as needed. For shortness of breath           fludrocortisone (FLORINEF) 0.1 MG tablet Take 0.1 mg by mouth daily.            loratadine (CLARITIN) 10 MG tablet Take 10 mg by mouth daily.            zolpidem (AMBIEN) 5 MG tablet Take 1 tablet (5 mg total) by mouth at bedtime as needed for sleep.           citalopram (CELEXA) 20 MG tablet Take 20 mg by mouth daily.             ALPRAZolam (XANAX) 0.25 MG tablet Take 0.25 mg by mouth 3 (three) times daily as needed. For anxiety            amLODipine (NORVASC) 10 MG tablet Take 1 tablet (10 mg total) by mouth daily.           metoprolol tartrate (LOPRESSOR) 25 MG tablet Take 1 tablet (25 mg total) by mouth 2 (two) times daily.           traMADol (ULTRAM) 50 MG tablet Take 1 tablet (50 mg total) by mouth every 8 (eight) hours as needed for pain (TAKE 1-2 TABS  AS NEEDED FOR PAIN). Maximum dose= 8 tablets per day            Verbal and written Discharge instructions given to the patient. Wound care per Discharge AVS  Signed: Marlowe Shores 11/21/2010, 7:42 AM

## 2010-11-21 NOTE — Progress Notes (Signed)
Physical Therapy Treatment Patient Details Name: Steve [name] MRN: [id number] DOB: 12/15/1933 Today's Date: 11/21/2010  PT Assessment/Plan  PT - Assessment/Plan Comments on Treatment Session: The patient is progressing well.  He is hoping to go home with his wife today.  He has had significant improvement in mobility and is pretty close to his baseline of walking with RW (rollator) at home with wife's supervision.  He politely declined HHPT at discharge.   PT Plan: Discharge plan needs to be updated PT Frequency: Min 3X/week Follow Up Recommendations: Home health PT;Other (comment) (however, patient is refusing this service.  ) Equipment Recommended: None recommended by PT PT Goals  Acute Rehab PT Goals PT Goal: Supine/Side to Sit - Progress: Progressing toward goal Pt will Transfer Sit to Stand/Stand to Sit: with modified independence PT Transfer Goal: Sit to Stand/Stand to Sit - Progress: Revised (modified due to lack of progress/goal met) PT Transfer Goal: Bed to Chair/Chair to Bed - Progress: Other (comment) (not tested today) PT Goal: Ambulate - Progress: Progressing toward goal PT Goal: Up/Down Stairs - Progress: Other (comment) (not tested today)  PT Treatment Precautions/Restrictions  Precautions Precautions: Fall Required Braces or Orthoses: No Restrictions Weight Bearing Restrictions: No Mobility (including Balance) Bed Mobility Bed Mobility: Yes Supine to Sit: 6: Modified independent (Device/Increase time);With rails;HOB elevated (Comment degrees);Other (comment) (HOB 20 degrees) Sitting - Scoot to Edge of Bed: 6: Modified independent (Device/Increase time);With rail Sit to Supine - Right: 6: Modified independent (Device/Increase time);With rail;HOB elevated (comment degrees);Other (comment) (HOB 20 degrees) Transfers Transfers: Yes Sit to Stand: 5: Supervision Sit to Stand Details (indicate cue type and reason): supervision for safety Stand to Sit: 5:  Supervision Stand to Sit Details: supervision for safety, uncontrolled "plop" descent to bed Ambulation/Gait Ambulation/Gait: Yes Ambulation/Gait Assistance: 4: Min assist Ambulation/Gait Assistance Details (indicate cue type and reason): min assist without assistive device, supervision with RW Ambulation Distance (Feet): 75 Feet Assistive device: None;Rolling walker Gait Pattern:  (staggering)    Exercise    End of Session PT - End of Session Activity Tolerance: Patient limited by fatigue Patient left: in bed;with family/visitor present General Behavior During Session: [facility] for tasks performed Cognition: [facility] for tasks performed  Shantrell Placzek B. Melville Engen, PT, DPT [phone]  11/21/2010, 10:36 AM

## 2010-11-21 NOTE — Telephone Encounter (Signed)
Waiting for fax from hospital to review d/c meds since no meds are in the pts chart.

## 2010-11-21 NOTE — Progress Notes (Signed)
Pt discharged with wife and daughter, instructions explained and given to wife along with prescriptions and follow up appointment, no questions.  Pt discharged via volunteer services. [address]

## 2010-11-22 ENCOUNTER — Encounter (HOSPITAL_COMMUNITY): Payer: Self-pay | Admitting: Vascular Surgery

## 2010-11-22 MED ORDER — TIOTROPIUM BROMIDE MONOHYDRATE 18 MCG IN CAPS: 18.0000 ug | ORAL_CAPSULE | Freq: Every day | RESPIRATORY_TRACT | Status: DC

## 2010-11-22 NOTE — Progress Notes (Signed)
  Subjective:    Patient ID: Steve [name], male    DOB: May 02, 1933, 77 y.o.   MRN: [id number]  HPI Pt was not seen, in hospital    Review of Systems     Objective:   Physical Exam        Assessment & Plan:

## 2010-11-22 NOTE — Telephone Encounter (Signed)
Yes please order spiriva - I left instructions for vascular sgy to do this!!

## 2010-11-22 NOTE — Assessment & Plan Note (Signed)
Not seen , in hospital

## 2010-11-22 NOTE — Telephone Encounter (Signed)
I spoke with pt wife and she is aware rx for spiriva has been sent to pharmacy for pt and needed nothing further

## 2010-11-22 NOTE — Telephone Encounter (Signed)
Called yesterday to get discharge papers from the hospital--never received these discharge papers---will forward message to RB to find out if ok to send in the spiriva for the pt.   RB please advise.  thanks

## 2010-11-23 ENCOUNTER — Telehealth: Payer: Self-pay | Admitting: Internal Medicine

## 2010-11-23 NOTE — Telephone Encounter (Signed)
Call placed to [facility], spoke with christina she stated Zolpidem is requiring prior authorization because patient is not due to refill until 11/29/2010.  Call placed to patient , spoke with patients wife [name], she was informed refill not due until 11/20. She stated she will contact pharmacy for refill at that time. No refills provided at this time.

## 2010-11-23 NOTE — Telephone Encounter (Signed)
Refill zolpidem 5 mg tab take 1 at bedtime as needed for sleep qty 30 last fill 11-23-2010

## 2010-11-28 ENCOUNTER — Encounter: Payer: Self-pay | Admitting: Adult Health

## 2010-11-28 ENCOUNTER — Ambulatory Visit (INDEPENDENT_AMBULATORY_CARE_PROVIDER_SITE_OTHER): Payer: Medicare HMO | Admitting: Adult Health

## 2010-11-28 VITALS — BP 108/64 | HR 47 | Temp 96.7°F | Ht 68.0 in | Wt 200.4 lb

## 2010-11-28 DIAGNOSIS — J9620 Acute and chronic respiratory failure, unspecified whether with hypoxia or hypercapnia: Secondary | ICD-10-CM

## 2010-11-28 DIAGNOSIS — J449 Chronic obstructive pulmonary disease, unspecified: Secondary | ICD-10-CM

## 2010-11-28 DIAGNOSIS — G4710 Hypersomnia, unspecified: Secondary | ICD-10-CM

## 2010-11-28 NOTE — Assessment & Plan Note (Signed)
Patient has several risk factors for sleep apnea including obesity, complaint of daytime hypersomnolence, snoring, and irregular breathing during sleep time. We'll set patient up for a split-night sleep test. Patient to return for follow up in 4 weeks

## 2010-11-28 NOTE — Assessment & Plan Note (Signed)
Suspected underlying COPD, unable to find any previous spirometry or pulmonary function test in chart. Patient will continue on Spiriva. We'll check a pulmonary function test on return in 4 weeks

## 2010-11-28 NOTE — Patient Instructions (Signed)
Continue on Spiriva daily  follow up Dr. Delton Coombes in 4 weeks with PFT  We are setting you up for a sleep study  Please contact office for sooner follow up if symptoms do not improve or worsen or seek emergency care

## 2010-11-28 NOTE — Progress Notes (Signed)
Addended by: Boone Master E on: 11/28/2010 02:06 PM   Modules accepted: Orders

## 2010-11-28 NOTE — Progress Notes (Signed)
  Subjective:    Patient ID: Steve [name], male    DOB: [birth year], 77 y.o.   MRN: [id number]  HPI 77 year old male with a known history of COPD (former smoker) and  AAA w/ planned aneurysm repair on 11/14/2010. Patient had postop complications with respiratory acidosis and hypoxemia requiring intubation.  Critical care team was consulted for initial consult.  11/28/2010 Post hospital  Pt returns for post hospital followup. Patient was hospitalized November through November 12 for planned abdominal aortic aneurysm repair. Patient had postop complications with respiratory acidosis and hypoxemia requiring intubation and vent support. Critical care team with initial consult. Patient had known underlying COPD and was on home O2 followed by his primary care doctor. Patient was treated with aggressive pulmonary hygiene along with IV antibiotics and diuresis and was successfully extubated on November 8. Patient was started on Spiriva. Echocardiogram during admission showed normal left ventricular function. Patient did have atrial fibrillation during hospitalization. He was started on metoprolol and converted over to normal sinus rhythm prior to discharge. This was felt likely to stress response and hypokalemia. Patient's Norvasc was held prior to discharge. Patient does complain that he believes he has sleep apnea and wants to be tested for this. Patient's wife says that he snores  and stops breathing intermittently. He complains of daytime hypersomnolence  -tired all the time.   Since discharge. Patient says he is slowly improving. He denies any cough, chest pain, wheezing, or increased leg swelling. Continues to feel extremely weak. It wears out easily with shortness of breath with activity.   Review of Systems Constitutional:   No  weight loss, night sweats,  Fevers, chills,  ++fatigue, or  lassitude.  HEENT:   No headaches,  Difficulty swallowing,  Tooth/dental problems, or  Sore throat,     No sneezing, itching, ear ache, nasal congestion, post nasal drip,   CV:  No chest pain,  Orthopnea, PND,   anasarca, dizziness, palpitations, syncope.   GI  No heartburn, indigestion, abdominal pain, nausea, vomiting, diarrhea, change in bowel habits, loss of appetite, bloody stools.   Resp:  No coughing up of blood.  No change in color of mucus.  No wheezing.  No chest wall deformity  Skin: no rash or lesions.  GU: no dysuria, change in color of urine,   No flank pain, no hematuria   MS:  No joint pain or swelling.  No decreased range of motion.     Psych:  No change in mood or affect. No depression or anxiety.  No memory loss.         Objective:   Physical Exam GEN: A/Ox3; pleasant , NAD, elderly   HEENT:  [place]/AT,  EACs-clear, TMs-wnl, NOSE-clear, THROAT-clear, no lesions, no postnasal drip or exudate noted.   NECK:  Supple w/ fair ROM; no JVD; normal carotid impulses w/o bruits; no thyromegaly or nodules palpated; no lymphadenopathy.  RESP  Coarse BS w/o, wheezes/ rales/ or rhonchi.no accessory muscle use, no dullness to percussion  CARD:  RRR, no m/r/g  , tr peripheral edema, pulses intact, no cyanosis or clubbing.  GI:   Soft & nt; nml bowel sounds; no organomegaly or masses detected.  Musco: Warm bil, no deformities or joint swelling noted.   Neuro: alert, no focal deficits noted.    Skin: Warm, no lesions or rashes         Assessment & Plan:

## 2010-11-28 NOTE — Assessment & Plan Note (Signed)
Recent flare, postop with suspected volume overload and underlying COPD Now resolved. Patient continue on O2 With activity and at bedtime.

## 2010-12-19 ENCOUNTER — Encounter: Payer: Self-pay | Admitting: Vascular Surgery

## 2010-12-19 ENCOUNTER — Ambulatory Visit (HOSPITAL_BASED_OUTPATIENT_CLINIC_OR_DEPARTMENT_OTHER)
Admission: RE | Admit: 2010-12-19 | Discharge: 2010-12-19 | Disposition: A | Discharge status: Home / Self Care | Payer: Medicare HMO | Source: Ambulatory Visit | Attending: Family | Admitting: Family

## 2010-12-19 ENCOUNTER — Telehealth: Payer: Self-pay | Admitting: Family

## 2010-12-19 ENCOUNTER — Encounter: Payer: Self-pay | Admitting: Family

## 2010-12-19 ENCOUNTER — Ambulatory Visit (INDEPENDENT_AMBULATORY_CARE_PROVIDER_SITE_OTHER): Payer: Medicare HMO | Admitting: Family

## 2010-12-19 VITALS — BP 122/72 | HR 53 | Temp 98.3°F | Resp 20 | Wt 192.1 lb

## 2010-12-19 DIAGNOSIS — R05 Cough: Secondary | ICD-10-CM

## 2010-12-19 DIAGNOSIS — R059 Cough, unspecified: Secondary | ICD-10-CM

## 2010-12-19 DIAGNOSIS — J441 Chronic obstructive pulmonary disease with (acute) exacerbation: Secondary | ICD-10-CM

## 2010-12-19 DIAGNOSIS — J4489 Other specified chronic obstructive pulmonary disease: Secondary | ICD-10-CM

## 2010-12-19 DIAGNOSIS — J449 Chronic obstructive pulmonary disease, unspecified: Secondary | ICD-10-CM

## 2010-12-19 MED ORDER — PREDNISONE 10 MG PO TABS: ORAL_TABLET | ORAL | Status: DC

## 2010-12-19 MED ORDER — CEFUROXIME AXETIL 500 MG PO TABS: 500.0000 mg | ORAL_TABLET | Freq: Two times a day (BID) | ORAL | Status: DC

## 2010-12-19 NOTE — Patient Instructions (Signed)
Please follow up with Dr. Rodena Medin in 1 week.  Complete your chest x-ray on the first floor today. Call if your symptoms worsen or if you are not feeling better in 2-3 days.

## 2010-12-19 NOTE — Progress Notes (Signed)
Subjective:    Patient ID: Steve [name], male    DOB: 01-17-1933, 77 y.o.   MRN: [id number]  HPI  Steve [name] is a 77 yr old male who presents today with chief complaint of "lung pain."  Thinks he has "walking pneumonia."   Pt has pain in the left lower lung. He reports 3 week hx of cough intermittent x 3 weeks.  Lung pain is worsening but cough is about the same. He has not had fever and has not taken anything otc.  Notes + associated shortness of breath and DOE which is worse than his baseline.  Notes that he has had poor energy since his abdominial Aortic aneurysm repair at [facility] the first week of November under Dr. Arbie Cookey.    Of note pt is s/p resection and grafting of abdominal aortic aneurysm on 11/14/2010. His post operative course was apparently complicated by vent support for several days.     Review of Systems See HPI  Past Medical History  Diagnosis Date  . Hypertension   . Prostate enlargement   . History of chicken pox     childhood  . GERD (gastroesophageal reflux disease)   . Arthritis     knee and back  . AAA (abdominal aortic aneurysm)     requested stress test from [place]  . Anxiety     History   Social History  . Marital Status: Married    Spouse Name: N/A    Number of Children: N/A  . Years of Education: N/A   Occupational History  . Not on file.   Social History Main Topics  . Smoking status: Former Smoker -- 2.5 packs/day for 55 years    Types: Cigarettes    Quit date: 07/30/2010  . Smokeless tobacco: Never Used   Comment: has not smoked since 07/30/2010  . Alcohol Use: No  . Drug Use: No  . Sexually Active: Not on file   Other Topics Concern  . Not on file   Social History Narrative  . No narrative on file    Past Surgical History  Procedure Date  . Knee surgery 1957    left knee arthotomy  . Abdominal aortic aneurysm repair 11/14/2010    Procedure: ANEURYSM ABDOMINAL AORTIC REPAIR;  Surgeon: Larina Earthly, MD;  Location: [facility];   Service: Vascular;  Laterality: N/A;    Family History  Problem Relation Age of Onset  . Arthritis Mother   . Heart failure Mother   . Hypertension Mother   . Diabetes Mother   . Heart failure Daughter     deceased age 93  . Other Daughter     deceased age 51 MVA    Allergies  Allergen Reactions  . Lyrica (Pregabalin)     unknown    Current Outpatient Prescriptions on File Prior to Visit  Medication Sig Dispense Refill  . ALPRAZolam (XANAX) 0.25 MG tablet Take 0.25 mg by mouth 3 (three) times daily as needed. For anxiety       . aspirin 81 MG tablet Take 81 mg by mouth daily.       . citalopram (CELEXA) 20 MG tablet Take 20 mg by mouth daily.        Marland Kitchen loratadine (CLARITIN) 10 MG tablet Take 10 mg by mouth daily.       . metoprolol tartrate (LOPRESSOR) 25 MG tablet Take 1 tablet (25 mg total) by mouth 2 (two) times daily.  60 tablet  2  .  Tamsulosin HCl (FLOMAX) 0.4 MG CAPS Take 0.4 mg by mouth daily after supper.        . tiotropium (SPIRIVA HANDIHALER) 18 MCG inhalation capsule Place 1 capsule (18 mcg total) into inhaler and inhale daily.  30 capsule  3  . zolpidem (AMBIEN) 5 MG tablet Take 1 tablet (5 mg total) by mouth at bedtime as needed for sleep.  30 tablet  0  . albuterol (VENTOLIN HFA) 108 (90 BASE) MCG/ACT inhaler Inhale 2 puffs into the lungs every 4 (four) hours as needed. For shortness of breath        BP 122/72  Pulse 53  Temp(Src) 98.3 F (36.8 C) (Oral)  Resp 20  Wt 192 lb 1.3 oz (87.127 kg)  SpO2 96%       Objective:   Physical Exam  Constitutional: He appears well-developed and well-nourished. No distress.  HENT:  Head: Normocephalic and atraumatic.  Right Ear: Tympanic membrane and ear canal normal.  Left Ear: Tympanic membrane and ear canal normal.  Cardiovascular: Normal rate and regular rhythm.   No murmur heard. Pulmonary/Chest: Effort normal and breath sounds normal. No respiratory distress. He has no wheezes. He has no rales. He exhibits  no tenderness.  Musculoskeletal: He exhibits no edema.  Psychiatric: He has a normal mood and affect. His behavior is normal. Judgment and thought content normal.          Assessment & Plan:

## 2010-12-19 NOTE — Telephone Encounter (Signed)
Reviewed CXR.  It is negative for pneumonia.  Will plan to treat empirically with ceftin and add a pred taper to help with his breathing.  Spoke with pt. He is aware of results and plan.

## 2010-12-20 ENCOUNTER — Encounter: Payer: Self-pay | Admitting: Vascular Surgery

## 2010-12-20 ENCOUNTER — Ambulatory Visit (INDEPENDENT_AMBULATORY_CARE_PROVIDER_SITE_OTHER): Payer: Medicare HMO | Admitting: Vascular Surgery

## 2010-12-20 VITALS — BP 139/56 | HR 77 | Resp 22 | Ht 70.0 in | Wt 188.0 lb

## 2010-12-20 DIAGNOSIS — I714 Abdominal aortic aneurysm, without rupture: Secondary | ICD-10-CM

## 2010-12-20 NOTE — Progress Notes (Signed)
The patient presents today for followup of his resection and grafting of abdominal aortic aneurysm on 11/14/2010. His hospital course was prolonged with respiratory decompensation and several days of ventilation on respirator. He is a well since discharged home although he continues to have some shortness of breath and is on prednisone and antibiotics for this. He does report the usual diminished and a decreased appetite and slow return of bowel function.  Physical exam: Abdomen soft nontender well-healed midline incision with no evidence of hernia. He has 2+ femoral and popliteal pulses bilaterally.  Past Medical History  Diagnosis Date  . Hypertension   . Prostate enlargement   . History of chicken pox     childhood  . GERD (gastroesophageal reflux disease)   . Arthritis     knee and back  . AAA (abdominal aortic aneurysm)     requested stress test from [place]  . Anxiety     History  Substance Use Topics  . Smoking status: Former Smoker -- 2.5 packs/day for 55 years    Types: Cigarettes    Quit date: 07/30/2010  . Smokeless tobacco: Never Used   Comment: has not smoked since 07/30/2010  . Alcohol Use: No    Family History  Problem Relation Age of Onset  . Arthritis Mother   . Heart failure Mother   . Hypertension Mother   . Diabetes Mother   . Heart failure Daughter     deceased age 38  . Other Daughter     deceased age 78 MVA    Allergies  Allergen Reactions  . Lyrica (Pregabalin)     unknown    Current outpatient prescriptions:albuterol (VENTOLIN HFA) 108 (90 BASE) MCG/ACT inhaler, Inhale 2 puffs into the lungs every 4 (four) hours as needed. For shortness of breath, Disp: , Rfl: ;  ALPRAZolam (XANAX) 0.25 MG tablet, Take 0.25 mg by mouth 3 (three) times daily as needed. For anxiety , Disp: , Rfl: ;  aspirin 81 MG tablet, Take 81 mg by mouth daily. , Disp: , Rfl:  cefUROXime (CEFTIN) 500 MG tablet, Take 1 tablet (500 mg total) by mouth 2 (two) times daily., Disp: 14  tablet, Rfl: 0;  citalopram (CELEXA) 20 MG tablet, Take 20 mg by mouth daily.  , Disp: , Rfl: ;  loratadine (CLARITIN) 10 MG tablet, Take 10 mg by mouth daily. , Disp: , Rfl: ;  metoprolol tartrate (LOPRESSOR) 25 MG tablet, Take 1 tablet (25 mg total) by mouth 2 (two) times daily., Disp: 60 tablet, Rfl: 2 predniSONE (DELTASONE) 10 MG tablet, 4 tabs by mouth daily for 2 days, then 3 tabs daily for 2 days, then 2 tabs daily for 2 days, then 1 tab daily for 2 days then stop., Disp: 20 tablet, Rfl: 0;  Tamsulosin HCl (FLOMAX) 0.4 MG CAPS, Take 0.4 mg by mouth daily after supper.  , Disp: , Rfl: ;  tiotropium (SPIRIVA HANDIHALER) 18 MCG inhalation capsule, Place 1 capsule (18 mcg total) into inhaler and inhale daily., Disp: 30 capsule, Rfl: 3 zolpidem (AMBIEN) 5 MG tablet, Take 1 tablet (5 mg total) by mouth at bedtime as needed for sleep., Disp: 30 tablet, Rfl: 0  BP 139/56  Pulse 77  Resp 22  Ht 5\' 10"  (1.778 m)  Wt 188 lb (85.276 kg)  BMI 26.98 kg/m2  Body mass index is 26.98 kg/(m^2).        Impression and plan: Stable status post open aneurysm repair area the patient was reassured that his stamina  appetite malfunction will return to normal. We will see him again in 2 months for continued followup. He is to have prostate surgery plan I feel that it is safe to proceed any time. I would assure adequate antibiotic coverage for any prostate manipulation.

## 2010-12-21 DIAGNOSIS — J441 Chronic obstructive pulmonary disease with (acute) exacerbation: Secondary | ICD-10-CM | POA: Insufficient documentation

## 2010-12-21 NOTE — Assessment & Plan Note (Signed)
Will plan to treat with ceftin and a prednisone taper. CXR is performed today is is neg for infiltrate.

## 2010-12-23 ENCOUNTER — Encounter (HOSPITAL_COMMUNITY): Payer: Self-pay | Admitting: Emergency Medicine

## 2010-12-23 ENCOUNTER — Emergency Department (HOSPITAL_COMMUNITY)
Admission: EM | Admit: 2010-12-23 | Discharge: 2010-12-23 | Disposition: A | Discharge status: Home / Self Care | Payer: Medicare HMO | Attending: Emergency Medicine | Admitting: Emergency Medicine

## 2010-12-23 ENCOUNTER — Ambulatory Visit (HOSPITAL_BASED_OUTPATIENT_CLINIC_OR_DEPARTMENT_OTHER): Payer: Medicare HMO

## 2010-12-23 VITALS — Ht 68.0 in | Wt 190.0 lb

## 2010-12-23 DIAGNOSIS — N139 Obstructive and reflux uropathy, unspecified: Secondary | ICD-10-CM | POA: Insufficient documentation

## 2010-12-23 DIAGNOSIS — F17200 Nicotine dependence, unspecified, uncomplicated: Secondary | ICD-10-CM | POA: Insufficient documentation

## 2010-12-23 DIAGNOSIS — G4733 Obstructive sleep apnea (adult) (pediatric): Secondary | ICD-10-CM

## 2010-12-23 DIAGNOSIS — K219 Gastro-esophageal reflux disease without esophagitis: Secondary | ICD-10-CM | POA: Insufficient documentation

## 2010-12-23 DIAGNOSIS — R141 Gas pain: Secondary | ICD-10-CM | POA: Insufficient documentation

## 2010-12-23 DIAGNOSIS — Z79899 Other long term (current) drug therapy: Secondary | ICD-10-CM | POA: Insufficient documentation

## 2010-12-23 DIAGNOSIS — Z9889 Other specified postprocedural states: Secondary | ICD-10-CM | POA: Insufficient documentation

## 2010-12-23 DIAGNOSIS — R142 Eructation: Secondary | ICD-10-CM | POA: Insufficient documentation

## 2010-12-23 DIAGNOSIS — I10 Essential (primary) hypertension: Secondary | ICD-10-CM | POA: Insufficient documentation

## 2010-12-23 DIAGNOSIS — J449 Chronic obstructive pulmonary disease, unspecified: Secondary | ICD-10-CM | POA: Insufficient documentation

## 2010-12-23 DIAGNOSIS — J9620 Acute and chronic respiratory failure, unspecified whether with hypoxia or hypercapnia: Secondary | ICD-10-CM

## 2010-12-23 DIAGNOSIS — R10819 Abdominal tenderness, unspecified site: Secondary | ICD-10-CM | POA: Insufficient documentation

## 2010-12-23 DIAGNOSIS — J4489 Other specified chronic obstructive pulmonary disease: Secondary | ICD-10-CM | POA: Insufficient documentation

## 2010-12-23 DIAGNOSIS — F411 Generalized anxiety disorder: Secondary | ICD-10-CM | POA: Insufficient documentation

## 2010-12-23 LAB — URINALYSIS, ROUTINE W REFLEX MICROSCOPIC
Bilirubin Urine: NEGATIVE
Glucose, UA: NEGATIVE mg/dL
Ketones, ur: NEGATIVE mg/dL
Nitrite: NEGATIVE
Protein, ur: 30 mg/dL — AB
Specific Gravity, Urine: 1.018 (ref 1.005–1.030)
Urobilinogen, UA: 0.2 mg/dL (ref 0.0–1.0)
pH: 6 (ref 5.0–8.0)

## 2010-12-23 LAB — URINE MICROSCOPIC-ADD ON

## 2010-12-23 NOTE — ED Provider Notes (Signed)
History     CSN: [id number] Arrival date & time: 12/23/2010  1:16 AM   First MD Initiated Contact with Patient 12/23/10 0148      Chief Complaint  Patient presents with  . Urinary Retention    (Consider location/radiation/quality/duration/timing/severity/associated sxs/prior treatment) HPI Comments: Pt has indwelling foley due to difficulty with urination due to enlarged prostate, is due for surgery in the near future. Thinks it is clogged, has sig lower abdominal pressure discomfort. No drainage of urine since 2PM.  Some leakage around foley site.  No dysuria, fever, chills.  No N/V.  Level 5 caveat due to urgent need for intervention.  The history is provided by the patient and the spouse.    Past Medical History  Diagnosis Date  . Hypertension   . Prostate enlargement   . History of chicken pox     childhood  . GERD (gastroesophageal reflux disease)   . Arthritis     knee and back  . AAA (abdominal aortic aneurysm)     requested stress test from [place]  . Anxiety     Past Surgical History  Procedure Date  . Knee surgery 1957    left knee arthotomy  . Abdominal aortic aneurysm repair 11/14/2010    Procedure: ANEURYSM ABDOMINAL AORTIC REPAIR;  Surgeon: Larina Earthly, MD;  Location: [facility] OR;  Service: Vascular;  Laterality: N/A;    Family History  Problem Relation Age of Onset  . Arthritis Mother   . Heart failure Mother   . Hypertension Mother   . Diabetes Mother   . Heart failure Daughter     deceased age 41  . Other Daughter     deceased age 67 MVA    History  Substance Use Topics  . Smoking status: Current Everyday Smoker -- 2.5 packs/day for 55 years    Types: Cigarettes    Last Attempt to Quit: 07/30/2010  . Smokeless tobacco: Never Used   Comment: has not smoked since 07/30/2010  . Alcohol Use: No      Review of Systems  Unable to perform ROS: Other    Allergies  Lyrica  Home Medications   Current Outpatient Rx  Name Route Sig Dispense  Refill  . ALBUTEROL SULFATE HFA 108 (90 BASE) MCG/ACT IN AERS Inhalation Inhale 2 puffs into the lungs every 4 (four) hours as needed. For shortness of breath    . ALPRAZOLAM 0.25 MG PO TABS Oral Take 0.25 mg by mouth 3 (three) times daily as needed. For anxiety     . ASPIRIN 81 MG PO TABS Oral Take 81 mg by mouth daily.     Marland Kitchen CEFUROXIME AXETIL 500 MG PO TABS Oral Take 1 tablet (500 mg total) by mouth 2 (two) times daily. 14 tablet 0  . CITALOPRAM HYDROBROMIDE 20 MG PO TABS Oral Take 20 mg by mouth daily.      Marland Kitchen LORATADINE 10 MG PO TABS Oral Take 10 mg by mouth daily.     Marland Kitchen METOPROLOL TARTRATE 25 MG PO TABS Oral Take 1 tablet (25 mg total) by mouth 2 (two) times daily. 60 tablet 2    DO NOT TAKE IF BLOOD PRESSURE LESS THAN 100 OR HEA ...  . PREDNISONE 10 MG PO TABS Oral Take 30 mg by mouth daily. 4 tabs by mouth daily for 2 days, then 3 tabs daily for 2 days, then 2 tabs daily for 2 days, then 1 tab daily for 2 days then stop.     Marland Kitchen  TAMSULOSIN HCL 0.4 MG PO CAPS Oral Take 0.4 mg by mouth daily after supper.      Marland Kitchen TIOTROPIUM BROMIDE MONOHYDRATE 18 MCG IN CAPS Inhalation Place 1 capsule (18 mcg total) into inhaler and inhale daily. 30 capsule 3  . ZOLPIDEM TARTRATE 5 MG PO TABS Oral Take 1 tablet (5 mg total) by mouth at bedtime as needed for sleep. 30 tablet 0    BP 187/91  Pulse 56  Temp(Src) 97.5 F (36.4 C) (Oral)  Resp 24  SpO2 96%  Physical Exam  Nursing note and vitals reviewed. Constitutional: He is oriented to person, place, and time. He appears well-developed and well-nourished. He appears distressed.  HENT:  Head: Normocephalic and atraumatic.  Pulmonary/Chest: Effort normal.  Abdominal: Soft. He exhibits distension and mass. There is tenderness. There is guarding. There is no rebound and no CVA tenderness.  Genitourinary: Testes normal. Circumcised.  Neurological: He is alert and oriented to person, place, and time.  Skin: Skin is warm and dry. No rash noted.    ED Course   Procedures (including critical care time)  Labs Reviewed - No data to display No results found.   No diagnosis found.    MDM    Foley replaced with immediate drainage of urine with immediate relief of all abd pain.  No guard or rebound on re-examination . If BP is ok after 1 hour time, will d/c home.        BP slightly improved, still HTN, btu gait normal, asymptomatic now.  Pt ok to go home.  Can follow up with his urologist as needed.    Gavin Pound. Oletta Lamas, MD 12/23/10 [phone]

## 2010-12-23 NOTE — ED Notes (Signed)
PT. REPORTS LEAKING INDWELLING FOLEY CATHETER AT INSERTION SITE THIS EVENING , INSERTED BY UROLOGIST 6 WEEKS AGO DUE TO URINARY RETENTION / PROSTATE ENLARGEMENT.

## 2010-12-26 LAB — URINE CULTURE
Colony Count: >=100000
Culture  Setup Time: 201212141049
Special Requests: NORMAL

## 2010-12-27 ENCOUNTER — Other Ambulatory Visit: Payer: Self-pay | Admitting: Urology

## 2010-12-27 NOTE — ED Notes (Addendum)
Patient Admitted

## 2010-12-29 ENCOUNTER — Ambulatory Visit (INDEPENDENT_AMBULATORY_CARE_PROVIDER_SITE_OTHER): Payer: Medicare HMO | Admitting: Emergency Medicine

## 2010-12-29 ENCOUNTER — Encounter: Payer: Self-pay | Admitting: Emergency Medicine

## 2010-12-29 ENCOUNTER — Ambulatory Visit: Payer: Medicare PPO | Admitting: Internal Medicine

## 2010-12-29 DIAGNOSIS — J449 Chronic obstructive pulmonary disease, unspecified: Secondary | ICD-10-CM

## 2010-12-29 DIAGNOSIS — G4710 Hypersomnia, unspecified: Secondary | ICD-10-CM

## 2010-12-29 DIAGNOSIS — Z0289 Encounter for other administrative examinations: Secondary | ICD-10-CM

## 2010-12-29 LAB — PULMONARY FUNCTION TEST

## 2010-12-29 NOTE — Progress Notes (Signed)
  Subjective:    Patient ID: Steve [name], male    DOB: Dec 03, 1933, 77 y.o.   MRN: [id number]  HPI 77 year old male with a known history of COPD (former smoker) and  AAA w/ planned aneurysm repair on 11/14/2010. Patient had postop complications with respiratory acidosis and hypoxemia requiring intubation.  Critical care team was consulted for initial consult.  11/28/2010 Post hospital  Pt returns for post hospital followup. Patient was hospitalized November through November 12 for planned abdominal aortic aneurysm repair. Patient had postop complications with respiratory acidosis and hypoxemia requiring intubation and vent support. Critical care team with initial consult. Patient had known underlying COPD and was on home O2 followed by his primary care doctor. Patient was treated with aggressive pulmonary hygiene along with IV antibiotics and diuresis and was successfully extubated on November 8. Patient was started on Spiriva. Echocardiogram during admission showed normal left ventricular function. Patient did have atrial fibrillation during hospitalization. He was started on metoprolol and converted over to normal sinus rhythm prior to discharge. This was felt likely to stress response and hypokalemia. Patient's Norvasc was held prior to discharge. Patient does complain that he believes he has sleep apnea and wants to be tested for this. Patient's wife says that he snores  and stops breathing intermittently. He complains of daytime hypersomnolence  -tired all the time.   Since discharge. Patient says he is slowly improving. He denies any cough, chest pain, wheezing, or increased leg swelling. Continues to feel extremely weak. It wears out easily with shortness of breath with activity.  ROV 12/29/10 --77yo  (> 120 pk-yrs),  follows up after acute episode hypercapneic resp failure while hospitalized post-AAA repair 11/12. He established care here 11/12, was seen by [place] in house. Had PFT today = normal AF  to MILD AFL, no BD response, decreased RV, decreased DLCO. He also had PSG 12/14. He is seeing Dr Hillis Range for TURP in January. He is unfortunately smoking again - 1 pk/day. He was started on Spiriva at time of discharge.    PULMONARY FUNCTON TEST 12/29/2010  FVC 3.59  FEV1 2.55  FEV1/FVC 71  FVC  % Predicted 91  FEV % Predicted 98  FeF 25-75 1.79  FeF 25-75 % Predicted 2.33       Objective:   Physical Exam GEN: A/Ox3; pleasant , NAD, elderly   HEENT:  /AT,  EACs-clear, TMs-wnl, NOSE-clear, THROAT-clear, no lesions, no postnasal drip or exudate noted.   NECK:  Supple w/ fair ROM; no JVD; normal carotid impulses w/o bruits; no thyromegaly or nodules palpated; no lymphadenopathy.  RESP  Coarse BS w/o, wheezes/ rales/ or rhonchi.no accessory muscle use, no dullness to percussion  CARD:  RRR, no m/r/g  , tr peripheral edema, pulses intact, no cyanosis or clubbing.  Musco: Warm bil, no deformities or joint swelling noted.   Neuro: alert, no focal deficits noted.    Skin: Warm, no lesions or rashes        Assessment & Plan:  COPD (chronic obstructive pulmonary disease) Reassuring PFT today, mild disease by spiro.  - continue spiriva + SABA - discussed tobacco cessation and need to stop, will revisit in 6 months  Hypersomnolence PSG pending, will call him w results

## 2010-12-29 NOTE — Assessment & Plan Note (Signed)
PSG pending, will call him w results

## 2010-12-29 NOTE — Assessment & Plan Note (Signed)
Reassuring PFT today, mild disease by spiro.  - continue spiriva + SABA - discussed tobacco cessation and need to stop, will revisit in 6 months

## 2010-12-29 NOTE — Patient Instructions (Signed)
Lease continue your Spiriva every day Use albuterol 2 puffs if needed for shortness of breath We will call you with the results of your sleep test and decide about starting a CPAP mask Follow up with Dr Delton Coombes in 6 months to talk again about stopping smoking

## 2010-12-29 NOTE — Progress Notes (Signed)
PFT done today. 

## 2010-12-31 ENCOUNTER — Other Ambulatory Visit: Payer: Self-pay | Admitting: Thoracic Diseases

## 2011-01-03 NOTE — Procedures (Signed)
NAME:  Steve [name], Steve [name] NO.:  [id number]  MEDICAL RECORD NO.:  [id number]          PATIENT TYPE:  OUT  LOCATION:  SLEEP CENTER                 FACILITY:  [facility]  PHYSICIAN:  Coralyn Helling, MD        DATE OF BIRTH:  [birth year]  DATE OF STUDY:  01/02/2011                           NOCTURNAL POLYSOMNOGRAM  REFERRING PHYSICIAN:  Rubye Oaks, NP  FACILITY:  [facility].  REFERRING PHYSICIAN:  Leslye Peer, MD  INDICATION FOR STUDY:  Mr. Seto is a 77 year old male who has a history of COPD.  He also has snoring, sleep disruption, and daytime sleepiness.  He was referred to the Sleep Lab for evaluation of hypersomnia with obstructive sleep apnea.  Height is 5 feet and 8 inches, weight is 190 pounds, BMI is 29, neck size is 16 inches.  EPWORTH SLEEPINESS SCORE:  15.  MEDICATIONS:  Albuterol, Xanax, Norvasc, aspirin, Celexa, Florinef, Claritin, Flomax, Ultram, and Ambien.  SLEEP ARCHITECTURE:  The patient followed a split night study protocol. During the diagnostic portion of the study, total recording time was 168 minutes, total sleep time was 121 minute, sleep efficiency was 75%, sleep latency was 26 minutes, REM latency was 79 minutes.  This portion of the study was notable for the lack of stage III sleep and he slept predominantly in the nonsupine position.  During the titration portion of the study, total recording time was 259 minutes, total sleep time was 95 minutes, sleep efficiency was 36 minutes, sleep latency was 1 minute.  This portion of the study was notable for lack of REM sleep and stage III sleep, and he slept in both supine and nonsupine positions.  RESPIRATORY DATA:  The average respiratory rate was 20.  Moderate snoring was noted by the technician.  During the diagnostic portion of the study, the overall apnea-hypopnea index was 76.4.  There were 4 central apneic events and 11 mixed obstructive events.  The remainder of the  events were obstructive in nature.  During the titration portion of the study, the patient was initially started on CPAP of 5, but complained of difficulty tolerating pressures from this.  He was therefore switched to BiPAP starting at 8/4 and then increased to 12/8.  He had difficulty tolerating positive airway pressure therapy and requested that the mass will be removed prior to having adequate titration.  OXYGEN DATA:  The baseline oxygenation was 94%.  The oxygen saturation nadir was 85%.  The patient spent a total of 3.4 minutes with an oxygen saturation below 88%.  The study was conducted without the use of supplemental oxygen.  CARDIAC DATA:  The average heart rate was 48 and the rhythm strip showed sinus rhythm.  MOVEMENT-PARASOMNIA:  The periodic limb movement index was 0.  IMPRESSIONS-RECOMMENDATIONS:  This study shows evidence for severe obstructive sleep apnea with overall apnea-hypopnea index of 76.4, and an oxygen saturation nadir of 85%.  He was not able to tolerate initial attempts at positive airway pressure therapy during this split night study.  In addition to diet, excise, and weight reduction, I would recommend that the patient return to the Sleep Lab for a full night titration  study.     Coralyn Helling, MD Diplomat, American Board of Sleep Medicine    VS/MEDQ  D:  01/02/2011 09:42:35  T:  01/03/2011 00:07:26  Job:  [id number]

## 2011-01-05 ENCOUNTER — Encounter: Payer: Self-pay | Admitting: Emergency Medicine

## 2011-01-05 DIAGNOSIS — J449 Chronic obstructive pulmonary disease, unspecified: Secondary | ICD-10-CM

## 2011-01-05 DIAGNOSIS — G4733 Obstructive sleep apnea (adult) (pediatric): Secondary | ICD-10-CM

## 2011-01-05 DIAGNOSIS — J4489 Other specified chronic obstructive pulmonary disease: Secondary | ICD-10-CM

## 2011-01-05 DIAGNOSIS — Z79899 Other long term (current) drug therapy: Secondary | ICD-10-CM

## 2011-01-12 ENCOUNTER — Telehealth: Payer: Self-pay | Admitting: Emergency Medicine

## 2011-01-12 ENCOUNTER — Encounter (HOSPITAL_COMMUNITY): Payer: Self-pay | Admitting: Pharmacy Technician

## 2011-01-12 DIAGNOSIS — G4733 Obstructive sleep apnea (adult) (pediatric): Secondary | ICD-10-CM

## 2011-01-12 NOTE — Telephone Encounter (Signed)
lmomtcb for ann. Will forward to Dr. Delton Coombes to see if he has received pt's sleep study results yet. Please advise, thanks

## 2011-01-12 NOTE — Telephone Encounter (Signed)
Pt's wife called back.  Informed her I would check with RB if results of sleep study are back yet or not and we will call her back with his response.  Dewayne Hatch was ok with this.  RB, please advise if sleep study results have been read yet.  Sleep study was done 12/14.  Thanks!Marland Kitchen

## 2011-01-13 NOTE — Telephone Encounter (Signed)
Please let them know that he has SEVERE OSA, and that he will need to go back to the lab for a CPAP toitration study if he is willing to do so. Thanks.

## 2011-01-16 NOTE — Telephone Encounter (Signed)
I spoke with spouse and advised her of sleep results. She states to go ahead and have pt scheduled for cpap titration study. I have sent order.

## 2011-01-17 ENCOUNTER — Ambulatory Visit (HOSPITAL_BASED_OUTPATIENT_CLINIC_OR_DEPARTMENT_OTHER): Payer: BC Managed Care – PPO | Attending: Emergency Medicine

## 2011-01-17 VITALS — Ht 68.0 in | Wt 185.0 lb

## 2011-01-17 DIAGNOSIS — J449 Chronic obstructive pulmonary disease, unspecified: Secondary | ICD-10-CM | POA: Insufficient documentation

## 2011-01-17 DIAGNOSIS — G4733 Obstructive sleep apnea (adult) (pediatric): Secondary | ICD-10-CM | POA: Insufficient documentation

## 2011-01-17 DIAGNOSIS — Z79899 Other long term (current) drug therapy: Secondary | ICD-10-CM | POA: Insufficient documentation

## 2011-01-17 DIAGNOSIS — Z9989 Dependence on other enabling machines and devices: Secondary | ICD-10-CM

## 2011-01-17 DIAGNOSIS — J4489 Other specified chronic obstructive pulmonary disease: Secondary | ICD-10-CM | POA: Insufficient documentation

## 2011-01-19 ENCOUNTER — Encounter (HOSPITAL_COMMUNITY): Payer: Self-pay

## 2011-01-19 ENCOUNTER — Encounter (HOSPITAL_COMMUNITY)
Admission: RE | Admit: 2011-01-19 | Discharge: 2011-01-19 | Disposition: A | Discharge status: Home / Self Care | Payer: Medicare Other | Source: Ambulatory Visit | Attending: Urology | Admitting: Urology

## 2011-01-19 ENCOUNTER — Telehealth: Payer: Self-pay | Admitting: *Deleted

## 2011-01-19 ENCOUNTER — Telehealth: Payer: Self-pay | Admitting: Emergency Medicine

## 2011-01-19 ENCOUNTER — Other Ambulatory Visit: Payer: Self-pay

## 2011-01-19 HISTORY — DX: Chronic obstructive pulmonary disease, unspecified: J44.9

## 2011-01-19 HISTORY — DX: Cardiac arrhythmia, unspecified: I49.9

## 2011-01-19 LAB — CBC
HCT: 44.1 % (ref 39.0–52.0)
MCV: 92.8 fL (ref 78.0–100.0)
Platelets: 182 10*3/uL (ref 150–400)
RBC: 4.75 MIL/uL (ref 4.22–5.81)
WBC: 8 10*3/uL (ref 4.0–10.5)

## 2011-01-19 LAB — BASIC METABOLIC PANEL
CO2: 27 mEq/L (ref 19–32)
Chloride: 105 mEq/L (ref 96–112)
Creatinine, Ser: 1.14 mg/dL (ref 0.50–1.35)
GFR calc Af Amer: 71 mL/min — ABNORMAL LOW (ref >90)
Potassium: 4.3 mEq/L (ref 3.5–5.1)

## 2011-01-19 LAB — SURGICAL PCR SCREEN: Staphylococcus aureus: NEGATIVE

## 2011-01-19 NOTE — Pre-Procedure Instructions (Signed)
01/19/11 Talked with Nicki Guadalajara- triage nurse at Dr Rodena Medin at [facility] in [facility] .  I asked her had she received a call from Mrs. Pricilla Holm regarding pt.  Nurse stated she had not received any calls from pt's wife. I informed nurse that pt had fallen when got out of care this pm once home per wife and pt was not hurt per wife.  I instructed wife to call Dr Rodena Medin and to speak with Nicki Guadalajara - triage nurse and to inform her of this today.  I made Nicki Guadalajara the triage nurse aware.  The reason for the call to the wife was to inform her that I had informed Dr Hodgin's office of pt's complaint of some dizzy spells and B/ P status, and heart rate on ekg at preop appt.  That is when pt informed preop nurse .

## 2011-01-19 NOTE — Pre-Procedure Instructions (Signed)
01/19/11 Pt reports recent dizzy spells and reports has fallen x 1.  Pt states " It feels like my balance is off.  B/P 159/78 .  Pulse 49 on dynamapp on ekg pulse- 46.  PT denies dizziness at this time.  Notifed triage nurse at Dr Antoine Poche office of ekg. Pulse, pt hx of recent dizzy spells and hx of fallen x 1.  Pt and wife aware that Dr Antoine Poche office has been notified.  I told pt and wife that if they had not heard from cardiologist office by am to give them a return call.  Cardiologist office aware pt to have surgery on 01/25/11 here with Dr Retta Diones.   01/19/11 Pt had sleep study done 2 days ago.  I instructed both wife and pt to inform Dr Retta Diones office so that he would have results prior to surgery.  Both voiced understanding.

## 2011-01-19 NOTE — Telephone Encounter (Signed)
Notified pt's wife and she states she called a few moments ago and scheduled an appt with Dr Rodena Medin for tomorrow morning. Reviewed indications for ER evaluation as stated below and she voices underdstanding.

## 2011-01-19 NOTE — Telephone Encounter (Signed)
Paula Compton RN called  from [employer], to let Dr. Antoine Poche know that while patient was in their clinic while being pre-op, he C/O of being dizzy light headed , An EKG was done and reads SB 46 beats/minute with 1 st degree AV block. Patient stated to [facility] that he has been having these light headed spells and dizziness for a while and he follen one time last week. According to [facility] RN patient said  had a triple A repair in November 2012 in [facility] hospital and was seen there by Dr. Antoine Poche for A-fibrillation.  There is no records of triple A  Or Dr. Antoine Poche consult on E-Chart or clinic visit note. According to [facility] patient was d/C home in satisfactory condition. Patient is having prostate surgery on 01/25/11 by Dr. Marcine Matar.

## 2011-01-19 NOTE — Pre-Procedure Instructions (Signed)
01/19/11 Nurse called office of Dr Retta Diones and spoke with nurse. I informed her of pt status of B/P and heart rate of 46 on EKG and 49 on dynamapp.  Pt reports hx of recent dizzy spells .  Pt denies at preop appt.  I informed her that I had called PCP office of Dr Rodena Medin and reported and also told her that pt had fallen at home after preop appt this afternoon and wife reports pt has fallen several times over last few weeks.  Nurse stated she would inform Dr Retta Diones.  I also told her labs of CBC and BMET had been drawn at preop appt but results not back yet.

## 2011-01-19 NOTE — Telephone Encounter (Signed)
Given his bradycardia and h/o afib certainly would appreciate any cardiology recommendations. Otherwise as always can offer appt for evaluation. If has any stroke like sx's (leg/arm weakness, slurred speech, visual changes, or slurred speech or syncope/presyncope/loss of consciousness would be recommendation for immediate ED visit.

## 2011-01-19 NOTE — Telephone Encounter (Signed)
Received another call from Steve [name] stating she spoke to pt's wife again and she reports that pt fell at home this afternoon. CBC and BMP are pending. Please advise.

## 2011-01-19 NOTE — Pre-Procedure Instructions (Signed)
01/19/11 I called office of Dr Antoine Poche and informed them that I had called the PCP ( Dr Rodena Medin) who has been handling patient medication and informed them of total patient status since pt had not seen Dr Antoine Poche since in hospital on 11/12.  I spoke with [place] the triage nurse and she voiced understanding.

## 2011-01-19 NOTE — Pre-Procedure Instructions (Signed)
01/19/11 1705 pm Called and spoke with wife of pt.  Wife reports pt doing fine and Dr Rodena Medin ( PCP ) has called on checked on pt this pm and nurse of Dr Rodena Medin.  Pt has appt to see DR Hodgin in am of 01/20/11.  Instructed wife to tell pt to get up and down slowly .  Wife stated she would instruct pt.  Also informed wife that pcr screen was negative.

## 2011-01-19 NOTE — Telephone Encounter (Signed)
Received call from Paula Compton, RN at [facility] Pre-op stating pt is scheduled for a TURP on 01/25/11 with Dr Normajean Baxter. At pre-op visit today pt reported dizziness and said he fell last week because of it. EKG today showed HR 46, BP 159/78 and 1st degree AV block. States pt had AAA surgery last month and had some a-fib post op that he was evaluated for by Dr Antoine Poche. Paula Compton has also notified Dr Hochrein's office. Please advise.

## 2011-01-19 NOTE — Telephone Encounter (Signed)
Talked with ms. Pricilla Holm. She took pt's pulse a few minutes ago ~50-52. No loc/presyncope/syncope but has been dizzy. She had already stopped the flomax. Has appt here in the am. Will hold tonight and tomorrow am dose of lopressor until can be evaluated tomorrow am.

## 2011-01-19 NOTE — Telephone Encounter (Signed)
Called and spoke with pt's wife, Steve [name].  Steve [name] states pt is scheduled for prostate surgery with Dr. Retta Diones on 1/16 and she wanted to make sure RB will clear pt for surgery.  She states pt has a dx of COPD and OSA.  Steve [name] is aware RB out of office until Monday 1/14 and is ok to wait until for a response.  RB, please advise if you need to see pt or not prior to surgery for clearance? Thanks!!!

## 2011-01-19 NOTE — Telephone Encounter (Signed)
Will forward to Dr Hochrein for his knowledge 

## 2011-01-19 NOTE — Telephone Encounter (Signed)
Steve [name] called back to let us know pt and wife were confused . Pt was seen by Dr. Antoine Poche  in the hospital for A- fibrillation, the problem was  resolved soon after, no f/u needed . Dr Antoine Poche recommended for pt to be f/U by his  PCP Dr. Charlynn Court MD in [place] [facility] in [place] office. Dr. Rodena Medin PCP aware of today's event. Records were send to his office per [facility].

## 2011-01-19 NOTE — Patient Instructions (Signed)
22 Steve [name]  01/19/2011   Your procedure is scheduled on:  01/25/11 0830-1030 am   Report to [facility] at 0630 AM.  Call this number if you have problems the morning of surgery: [phone]   Remember:   Do not eat food:After Midnight.  May have clear liquids:until Midnight .  Clear liquids include soda, tea, black coffee, apple or grape juice, broth.  Take these medicines the morning of surgery with A SIP OF WATER:    Do not wear jewelry  Do not wear lotions, powders, or perfumes. .    Do not bring valuables to the hospital.  Contacts, dentures or bridgework may not be worn into surgery.  Leave suitcase in the car. After surgery it may be brought to your room.  For patients admitted to the hospital, checkout time is 11:00 AM the day of discharge.   Marland Kitchen   Special Instructions: CHG Shower Use Special Wash: 1/2 bottle night before surgery and 1/2 bottle morning of surgery. shower chin to toes with CHG.  Wash face and private parts with regular soap.    Please read over the following fact sheets that you were given: MRSA Information, coughing and deep breathing exercises, leg exercises

## 2011-01-20 ENCOUNTER — Ambulatory Visit (INDEPENDENT_AMBULATORY_CARE_PROVIDER_SITE_OTHER): Payer: BC Managed Care – PPO | Admitting: Internal Medicine

## 2011-01-20 ENCOUNTER — Encounter: Payer: Self-pay | Admitting: Internal Medicine

## 2011-01-20 VITALS — BP 122/80 | HR 47 | Temp 97.3°F | Resp 20 | Wt 193.0 lb

## 2011-01-20 DIAGNOSIS — R42 Dizziness and giddiness: Secondary | ICD-10-CM

## 2011-01-20 DIAGNOSIS — I498 Other specified cardiac arrhythmias: Secondary | ICD-10-CM

## 2011-01-20 DIAGNOSIS — R001 Bradycardia, unspecified: Secondary | ICD-10-CM | POA: Insufficient documentation

## 2011-01-20 NOTE — Assessment & Plan Note (Signed)
Question relationship to bradycardia though sx's may have predated bradycardia. Holding betablocker and schedule close follow up for re-evaluation. If sx's do not improve/resolve with resolution of bradycardia then consider postural hypotension. ?tilt table ?florinef.

## 2011-01-20 NOTE — Assessment & Plan Note (Signed)
EKG's performed demonstrate bradycardia with hr 46. Precordial lateral t wave inversions and possible ivcd. Dr. Antoine Poche of cardiology graciously reviewed ekg's with agreement of sb. t wave changes felt to be nonspecific.   Recommend continued holding of beta blocker and close follow up on Monday. Present to the ED if further falls or presyncope, syncope, chest pain, palpitations, dyspnea. Recommend temporary hold on planned turp until further evaluation.

## 2011-01-20 NOTE — Progress Notes (Signed)
  Subjective:    Patient ID: Steve [name], male    DOB: 12-03-1933, 78 y.o.   MRN: [id number]  HPI Pt presents to clinic for evaluation of bradycardia. Scheduled for TURP next week and during preop evaluation noted to be bradycardic in 40's. Notes recent dizziness with resulting frequent falls. No presyncope, syncope, palpitations, neurologic sx's, chest pain or dyspnea. Has held flomax for at least the last week. Also several days ago reduced metoprolol to qd from bid. Last metoprolol dose yesterday am. While in clinic HR ranged in the 40's. States with walking hr may rise to 70's. States has h/o chronic falls recently worse. Dizziness per pt seems to be possibly postural. No other alleviating or exacerbating factors. No other complaints.   Past Medical History  Diagnosis Date  . Hypertension   . Prostate enlargement   . History of chicken pox     childhood  . GERD (gastroesophageal reflux disease)   . Arthritis     knee and back  . AAA (abdominal aortic aneurysm)     requested stress test from [place]  . Anxiety   . Dysrhythmia     hx of atrial fib post op x 2 days   . COPD (chronic obstructive pulmonary disease)   . Shortness of breath     with exertion    Past Surgical History  Procedure Date  . Knee surgery 1957    left knee arthotomy  . Abdominal aortic aneurysm repair 11/14/2010    Procedure: ANEURYSM ABDOMINAL AORTIC REPAIR;  Surgeon: Larina Earthly, MD;  Location: [facility] OR;  Service: Vascular;  Laterality: N/A;    reports that he has been smoking Cigarettes.  He has a 55 pack-year smoking history. He has never used smokeless tobacco. He reports that he does not drink alcohol or use illicit drugs. family history includes Arthritis in his mother; Diabetes in his mother; Heart failure in his daughter and mother; Hypertension in his mother; and Other in his daughter. Allergies  Allergen Reactions  . Lyrica (Pregabalin) Hives     Review of Systems see hpi     Objective:   Physical Exam  Nursing note and vitals reviewed. Constitutional: He appears well-developed and well-nourished. No distress.  HENT:  Head: Normocephalic and atraumatic.  Right Ear: External ear normal.  Left Ear: External ear normal.  Eyes: Conjunctivae are normal. No scleral icterus.  Cardiovascular: Regular rhythm and normal heart sounds.  Bradycardia present.   Pulmonary/Chest: Effort normal and breath sounds normal. No respiratory distress. He has no wheezes. He has no rales.  Neurological: He is alert.  Skin: Skin is warm and dry. He is not diaphoretic.  Psychiatric: He has a normal mood and affect.          Assessment & Plan:

## 2011-01-23 ENCOUNTER — Ambulatory Visit (INDEPENDENT_AMBULATORY_CARE_PROVIDER_SITE_OTHER): Payer: BC Managed Care – PPO | Admitting: Internal Medicine

## 2011-01-23 ENCOUNTER — Encounter: Payer: Self-pay | Admitting: Internal Medicine

## 2011-01-23 DIAGNOSIS — R42 Dizziness and giddiness: Secondary | ICD-10-CM

## 2011-01-23 NOTE — Pre-Procedure Instructions (Signed)
01/23/11 Pt had sleep study done 01/17/11 pt was to call Dr Retta Diones office to let him be aware and to obtain results prior to surgery.

## 2011-01-23 NOTE — Telephone Encounter (Signed)
His COPD is mild by spirometry. As long as he is doing well clinically, no recent problems, then he is cleared for surgery, does not need to come in.. Thanks RB

## 2011-01-23 NOTE — Assessment & Plan Note (Signed)
Resolved s/p dc of beta blocker. Recommend continued monitoring of HR/BP at least twice a week. See no current contraindication for proceeding with TURP as scheduled.

## 2011-01-23 NOTE — Progress Notes (Signed)
  Subjective:    Patient ID: Steve [name], male    DOB: 03-Oct-1933, 78 y.o.   MRN: [id number]  HPI Pt presents to clinic for followup of multiple medical problems. Seen on Friday with ?symptomatic bradycardia. Dizziness and falls with heart rates 40s. Betablocker held and HR now 60's. Denies any further dizziness or falls. Feels "100% better." Scheduled for TURP in two days.  Past Medical History  Diagnosis Date  . Hypertension   . Prostate enlargement   . History of chicken pox     childhood  . GERD (gastroesophageal reflux disease)   . Arthritis     knee and back  . AAA (abdominal aortic aneurysm)     requested stress test from [place]  . Anxiety   . Dysrhythmia     hx of atrial fib post op x 2 days   . COPD (chronic obstructive pulmonary disease)   . Shortness of breath     with exertion    Past Surgical History  Procedure Date  . Knee surgery 1957    left knee arthotomy  . Abdominal aortic aneurysm repair 11/14/2010    Procedure: ANEURYSM ABDOMINAL AORTIC REPAIR;  Surgeon: Larina Earthly, MD;  Location: [facility] OR;  Service: Vascular;  Laterality: N/A;    reports that he has been smoking Cigarettes.  He has a 55 pack-year smoking history. He has never used smokeless tobacco. He reports that he does not drink alcohol or use illicit drugs. family history includes Arthritis in his mother; Diabetes in his mother; Heart failure in his daughter and mother; Hypertension in his mother; and Other in his daughter. Allergies  Allergen Reactions  . Lyrica (Pregabalin) Hives      Review of Systems see hpi      Objective:   Physical Exam  Nursing note and vitals reviewed. Constitutional: He appears well-developed and well-nourished. No distress.  HENT:  Head: Normocephalic and atraumatic.  Neurological: He is alert.  Skin: He is not diaphoretic.  Psychiatric: He has a normal mood and affect.          Assessment & Plan:

## 2011-01-23 NOTE — Telephone Encounter (Signed)
Pt and spouse aware that  As long as pt is doing well and not having any current problems he does not need clearance for surgery per Dr. Delton Coombes.  Pt and spouse both say the patient is doing well and not having any breathing issues at this time.

## 2011-01-23 NOTE — Telephone Encounter (Signed)
Thanks

## 2011-01-23 NOTE — Pre-Procedure Instructions (Signed)
01/23/11 See office visit notes dated 01/20/11 and 01/23/11 with PCP- Dr Rodena Medin.  Heart rate now in 60s per note.  Betablocker has been discontinued per note.

## 2011-01-24 NOTE — H&P (Signed)
Urology Admission H&P  Chief Complaint: Inability to urinate   History of Present Illness:  This man comes in for surgical managaement of BPH. He has an indwelling Foley catheter.  He has a large gland, and will need TURP. He has recovered from aneurysm repair by Dr. Arbie Cookey. Because of his large gland and inability to urinate, he presents today for TUR-P. He has been evaluated with UDS which has demonstrated obstruction.   Past Medical History  Diagnosis Date  . Hypertension   . Prostate enlargement   . History of chicken pox     childhood  . GERD (gastroesophageal reflux disease)   . Arthritis     knee and back  . AAA (abdominal aortic aneurysm)     requested stress test from [place]  . Anxiety   . Dysrhythmia     hx of atrial fib post op x 2 days   . COPD (chronic obstructive pulmonary disease)   . Shortness of breath     with exertion    Past Surgical History  Procedure Date  . Knee surgery 1957    left knee arthotomy  . Abdominal aortic aneurysm repair 11/14/2010    Procedure: ANEURYSM ABDOMINAL AORTIC REPAIR;  Surgeon: Larina Earthly, MD;  Location: [facility] OR;  Service: Vascular;  Laterality: N/A;    Home Medications:  No prescriptions prior to admission   Allergies:  Allergies  Allergen Reactions  . Lyrica (Pregabalin) Hives    Family History  Problem Relation Age of Onset  . Arthritis Mother   . Heart failure Mother   . Hypertension Mother   . Diabetes Mother   . Heart failure Daughter     deceased age 45  . Other Daughter     deceased age 50 MVA   Social History:  reports that he has been smoking Cigarettes.  He has a 55 pack-year smoking history. He has never used smokeless tobacco. He reports that he does not drink alcohol or use illicit drugs.  Review of Systems  Constitutional: Negative.   HENT: Negative.   Eyes: Negative.   Respiratory: Negative.   Cardiovascular:       Recent dizziness  Gastrointestinal: Negative.   Genitourinary:       Urinary  retention  Musculoskeletal: Negative.   Skin: Negative.   Neurological: Negative.   Endo/Heme/Allergies: Negative.   Psychiatric/Behavioral: Negative.   All other systems reviewed and are negative.    Physical Exam:  Vital signs in last 24 hours:   Physical Exam  Constitutional: He is oriented to person, place, and time. He appears well-developed and well-nourished.  HENT:  Head: Normocephalic and atraumatic.  Eyes: Pupils are equal, round, and reactive to light.  Neck: Normal range of motion. Neck supple.  Cardiovascular: Normal rate and regular rhythm.   Respiratory: Effort normal and breath sounds normal.  GI: Soft. Bowel sounds are normal.  Genitourinary: Rectum normal and penis normal.       Catheter in place.  Musculoskeletal: Normal range of motion.  Neurological: He is alert and oriented to person, place, and time.  Skin: Skin is warm and dry.  Psychiatric: He has a normal mood and affect. His behavior is normal.    Laboratory Data:  No results found for this or any previous visit (from the past 24 hour(s)). Recent Results (from the past 240 hour(s))  SURGICAL PCR SCREEN     Status: Normal   Collection Time   01/19/11  1:04 PM  Component Value Range Status Comment   MRSA, PCR NEGATIVE  NEGATIVE  Final    Staphylococcus aureus NEGATIVE  NEGATIVE  Final    Creatinine:  Basename 01/19/11 1345  CREATININE 1.14   Baseline Creatinine:   Impression/Assessment:  BPH with retention   Plan:  TUR-P  Marcine Matar M 01/24/2011, 9:20 PM

## 2011-01-24 NOTE — Pre-Procedure Instructions (Signed)
01/24/11 Pt's metoprolol discontinued per MD per note in EPIC.  Confirmed with wife by phone call.  Wife states pt feels much better and heart rate has improved on visit to PCP on 01/23/11.  Nurse made sure wife understood pt not to take Metoprolol as instructed by preop written instructions.  Wife voices understanding.

## 2011-01-25 ENCOUNTER — Encounter (HOSPITAL_COMMUNITY): Payer: Self-pay | Admitting: Certified Registered Nurse Anesthetist

## 2011-01-25 ENCOUNTER — Ambulatory Visit (HOSPITAL_COMMUNITY)
Admission: RE | Admit: 2011-01-25 | Discharge: 2011-01-26 | Disposition: A | Discharge status: Home / Self Care | Payer: Medicare Other | Source: Ambulatory Visit | Attending: Urology | Admitting: Urology

## 2011-01-25 ENCOUNTER — Encounter (HOSPITAL_COMMUNITY): Payer: Self-pay

## 2011-01-25 ENCOUNTER — Ambulatory Visit (HOSPITAL_COMMUNITY): Payer: Medicare Other | Admitting: Certified Registered Nurse Anesthetist

## 2011-01-25 ENCOUNTER — Other Ambulatory Visit: Payer: Self-pay

## 2011-01-25 ENCOUNTER — Encounter (HOSPITAL_COMMUNITY)
Admission: RE | Disposition: A | Discharge status: Home / Self Care | Payer: Self-pay | Source: Ambulatory Visit | Attending: Urology

## 2011-01-25 DIAGNOSIS — I714 Abdominal aortic aneurysm, without rupture, unspecified: Secondary | ICD-10-CM | POA: Insufficient documentation

## 2011-01-25 DIAGNOSIS — J4489 Other specified chronic obstructive pulmonary disease: Secondary | ICD-10-CM | POA: Insufficient documentation

## 2011-01-25 DIAGNOSIS — J449 Chronic obstructive pulmonary disease, unspecified: Secondary | ICD-10-CM | POA: Insufficient documentation

## 2011-01-25 DIAGNOSIS — N320 Bladder-neck obstruction: Secondary | ICD-10-CM | POA: Insufficient documentation

## 2011-01-25 DIAGNOSIS — N138 Other obstructive and reflux uropathy: Secondary | ICD-10-CM | POA: Insufficient documentation

## 2011-01-25 DIAGNOSIS — R0602 Shortness of breath: Secondary | ICD-10-CM | POA: Insufficient documentation

## 2011-01-25 DIAGNOSIS — I10 Essential (primary) hypertension: Secondary | ICD-10-CM | POA: Insufficient documentation

## 2011-01-25 DIAGNOSIS — N401 Enlarged prostate with lower urinary tract symptoms: Secondary | ICD-10-CM | POA: Insufficient documentation

## 2011-01-25 DIAGNOSIS — Z01812 Encounter for preprocedural laboratory examination: Secondary | ICD-10-CM | POA: Insufficient documentation

## 2011-01-25 DIAGNOSIS — K219 Gastro-esophageal reflux disease without esophagitis: Secondary | ICD-10-CM | POA: Insufficient documentation

## 2011-01-25 HISTORY — PX: TRANSURETHRAL RESECTION OF PROSTATE: SHX73

## 2011-01-25 LAB — URINE CULTURE
Colony Count: 75000
Culture  Setup Time: 201301161652

## 2011-01-25 SURGERY — TRANSURETHRAL RESECTION OF THE PROSTATE WITH GYRUS INSTRUMENTS
Anesthesia: General | Site: Prostate | Wound class: Clean Contaminated

## 2011-01-25 MED ORDER — ZOLPIDEM TARTRATE 5 MG PO TABS: 5.0000 mg | ORAL_TABLET | Freq: Every evening | ORAL | Status: DC | PRN

## 2011-01-25 MED ORDER — ALPRAZOLAM 0.25 MG PO TABS: 0.2500 mg | ORAL_TABLET | Freq: Three times a day (TID) | ORAL | Status: DC | PRN

## 2011-01-25 MED ORDER — ALBUTEROL SULFATE HFA 108 (90 BASE) MCG/ACT IN AERS: 2.0000 | INHALATION_SPRAY | RESPIRATORY_TRACT | Status: DC | PRN

## 2011-01-25 MED ORDER — EPHEDRINE SULFATE 50 MG/ML IJ SOLN
INTRAMUSCULAR | Status: DC | PRN
  Administered 2011-01-25: 5 mg via INTRAVENOUS

## 2011-01-25 MED ORDER — LACTATED RINGERS IV SOLN: INTRAVENOUS | Status: DC

## 2011-01-25 MED ORDER — PROPOFOL 10 MG/ML IV BOLUS
INTRAVENOUS | Status: DC | PRN
  Administered 2011-01-25: 10 mg via INTRAVENOUS

## 2011-01-25 MED ORDER — ACETAMINOPHEN 10 MG/ML IV SOLN
1000.0000 mg | Freq: Four times a day (QID) | INTRAVENOUS | Status: DC
  Administered 2011-01-25 – 2011-01-26 (×3): 1000 mg via INTRAVENOUS
  Filled 2011-01-25 (×4): qty 100

## 2011-01-25 MED ORDER — SODIUM CHLORIDE 0.9 % IR SOLN
Status: DC | PRN
  Administered 2011-01-25: 24000 mL

## 2011-01-25 MED ORDER — LIDOCAINE HCL (CARDIAC) 20 MG/ML IV SOLN
INTRAVENOUS | Status: DC | PRN
  Administered 2011-01-25: 100 mg via INTRAVENOUS

## 2011-01-25 MED ORDER — CITALOPRAM HYDROBROMIDE 20 MG PO TABS
20.0000 mg | ORAL_TABLET | Freq: Every day | ORAL | Status: DC
  Administered 2011-01-26: 20 mg via ORAL
  Filled 2011-01-25: qty 1

## 2011-01-25 MED ORDER — LACTATED RINGERS IV SOLN
INTRAVENOUS | Status: DC | PRN
  Administered 2011-01-25 (×2): via INTRAVENOUS

## 2011-01-25 MED ORDER — LORATADINE 10 MG PO TABS
10.0000 mg | ORAL_TABLET | Freq: Every day | ORAL | Status: DC
  Administered 2011-01-25 – 2011-01-26 (×2): 10 mg via ORAL
  Filled 2011-01-25 (×2): qty 1

## 2011-01-25 MED ORDER — BELLADONNA ALKALOIDS-OPIUM 16.2-60 MG RE SUPP: 1.0000 | Freq: Four times a day (QID) | RECTAL | Status: DC | PRN

## 2011-01-25 MED ORDER — NEOSTIGMINE METHYLSULFATE 1 MG/ML IJ SOLN
INTRAMUSCULAR | Status: DC | PRN
  Administered 2011-01-25: 2.5 mg via INTRAVENOUS

## 2011-01-25 MED ORDER — HYDROMORPHONE HCL PF 1 MG/ML IJ SOLN
0.2500 mg | INTRAMUSCULAR | Status: DC | PRN
  Administered 2011-01-25: 0.5 mg via INTRAVENOUS
  Administered 2011-01-25: 0.256 mg via INTRAVENOUS
  Administered 2011-01-25: 0.25 mg via INTRAVENOUS

## 2011-01-25 MED ORDER — PANTOPRAZOLE SODIUM 40 MG IV SOLR
40.0000 mg | INTRAVENOUS | Status: AC
  Administered 2011-01-25: 40 mg via INTRAVENOUS
  Filled 2011-01-25: qty 40

## 2011-01-25 MED ORDER — GENTAMICIN SULFATE 40 MG/ML IJ SOLN
450.0000 mg | Freq: Once | INTRAVENOUS | Status: AC
  Administered 2011-01-25: 450 mg via INTRAVENOUS
  Filled 2011-01-25: qty 11.25

## 2011-01-25 MED ORDER — DOCUSATE SODIUM 100 MG PO CAPS
100.0000 mg | ORAL_CAPSULE | Freq: Two times a day (BID) | ORAL | Status: DC
  Administered 2011-01-25 – 2011-01-26 (×2): 100 mg via ORAL
  Filled 2011-01-25 (×3): qty 1

## 2011-01-25 MED ORDER — CISATRACURIUM BESYLATE 2 MG/ML IV SOLN
INTRAVENOUS | Status: DC | PRN
  Administered 2011-01-25: 2 mg via INTRAVENOUS
  Administered 2011-01-25: 4 mg via INTRAVENOUS

## 2011-01-25 MED ORDER — GLYCOPYRROLATE 0.2 MG/ML IJ SOLN
INTRAMUSCULAR | Status: DC | PRN
  Administered 2011-01-25: .5 mg via INTRAVENOUS

## 2011-01-25 MED ORDER — HYDROMORPHONE HCL PF 1 MG/ML IJ SOLN
INTRAMUSCULAR | Status: AC
  Administered 2011-01-25: 12:00:00
  Filled 2011-01-25: qty 1

## 2011-01-25 MED ORDER — TIOTROPIUM BROMIDE MONOHYDRATE 18 MCG IN CAPS
18.0000 ug | ORAL_CAPSULE | Freq: Every day | RESPIRATORY_TRACT | Status: DC
  Administered 2011-01-26: 18 ug via RESPIRATORY_TRACT
  Filled 2011-01-25: qty 5

## 2011-01-25 MED ORDER — GENTAMICIN SULFATE 40 MG/ML IJ SOLN
450.0000 mg | Freq: Once | INTRAMUSCULAR | Status: DC
  Filled 2011-01-25: qty 11.25

## 2011-01-25 MED ORDER — PROMETHAZINE HCL 25 MG/ML IJ SOLN: 6.2500 mg | INTRAMUSCULAR | Status: DC | PRN

## 2011-01-25 MED ORDER — SUCCINYLCHOLINE CHLORIDE 20 MG/ML IJ SOLN
INTRAMUSCULAR | Status: DC | PRN
  Administered 2011-01-25: 100 mg via INTRAVENOUS

## 2011-01-25 MED ORDER — ONDANSETRON HCL 4 MG/2ML IJ SOLN
INTRAMUSCULAR | Status: DC | PRN
  Administered 2011-01-25: 4 mg via INTRAVENOUS

## 2011-01-25 MED ORDER — TRAMADOL HCL 50 MG PO TABS: 50.0000 mg | ORAL_TABLET | Freq: Four times a day (QID) | ORAL | Status: DC | PRN

## 2011-01-25 MED ORDER — CEFAZOLIN SODIUM-DEXTROSE 2-3 GM-% IV SOLR
2.0000 g | INTRAVENOUS | Status: AC
  Administered 2011-01-25: 2 g via INTRAVENOUS

## 2011-01-25 MED ORDER — FENTANYL CITRATE 0.05 MG/ML IJ SOLN
INTRAMUSCULAR | Status: DC | PRN
  Administered 2011-01-25 (×2): 50 ug via INTRAVENOUS

## 2011-01-25 MED ORDER — SODIUM CHLORIDE 0.45 % IV SOLN
INTRAVENOUS | Status: DC
  Administered 2011-01-25: 75 mL/h via INTRAVENOUS
  Administered 2011-01-25: via INTRAVENOUS

## 2011-01-25 SURGICAL SUPPLY — 24 items
BAG URINE DRAINAGE (UROLOGICAL SUPPLIES) ×2 IMPLANT
BAG URO CATCHER STRL LF (DRAPE) ×2 IMPLANT
BLADE SURG 15 STRL LF DISP TIS (BLADE) IMPLANT
BLADE SURG 15 STRL SS (BLADE)
CATH FOLEY 3WAY 30CC 22FR (CATHETERS) ×2 IMPLANT
CLOTH BEACON ORANGE TIMEOUT ST (SAFETY) ×2 IMPLANT
DRAPE CAMERA CLOSED 9X96 (DRAPES) ×2 IMPLANT
ELECT BUTTON HF 24-28F 2 30DE (ELECTRODE) ×1 IMPLANT
ELECT HF RESECT BIPO 24F 45 ND (CUTTING LOOP) ×1 IMPLANT
ELECT LOOP MED HF 24F 12D (CUTTING LOOP) ×2 IMPLANT
ELECT REM PT RETURN 9FT ADLT (ELECTROSURGICAL) ×2
ELECTRODE REM PT RTRN 9FT ADLT (ELECTROSURGICAL) ×1 IMPLANT
EVACUATOR MICROVAS BLADDER (UROLOGICAL SUPPLIES) ×1 IMPLANT
GLOVE BIOGEL M 8.0 STRL (GLOVE) ×2 IMPLANT
GOWN PREVENTION PLUS XLARGE (GOWN DISPOSABLE) ×2 IMPLANT
GOWN STRL REIN XL XLG (GOWN DISPOSABLE) ×2 IMPLANT
HOLDER FOLEY CATH W/STRAP (MISCELLANEOUS) ×1 IMPLANT
IV NS IRRIG 3000ML ARTHROMATIC (IV SOLUTION) ×12 IMPLANT
KIT ASPIRATION TUBING (SET/KITS/TRAYS/PACK) ×2 IMPLANT
MANIFOLD NEPTUNE II (INSTRUMENTS) ×2 IMPLANT
PACK CYSTO (CUSTOM PROCEDURE TRAY) ×2 IMPLANT
SUT ETHILON 3 0 PS 1 (SUTURE) IMPLANT
SYR 30ML LL (SYRINGE) ×1 IMPLANT
TUBING CONNECTING 10 (TUBING) ×2 IMPLANT

## 2011-01-25 NOTE — Addendum Note (Signed)
Addendum  created 01/25/11 1105 by Riesa Pope, MD   Modules edited:Orders

## 2011-01-25 NOTE — Progress Notes (Signed)
Post-op note  Subjective: The patient is doing well.  No complaints.He is comfortable.  Objective: Vital signs in last 24 hours: Temp:  [96.7 F (35.9 C)-98 F (36.7 C)] 97.4 F (36.3 C) (01/16 1455) Pulse Rate:  [61-87] 61  (01/16 1455) Resp:  [7-21] 18  (01/16 1455) BP: (122-180)/(64-83) 122/64 mmHg (01/16 1455) SpO2:  [94 %-100 %] 94 % (01/16 1455) Weight:  [87.544 kg (193 lb)] 87.544 kg (193 lb) (01/16 1300) Urine is clear pink. Intake/Output from previous day:   Intake/Output this shift: Total I/O In: 2581.3 [I.V.:2370; IV Piggyback:211.3] Out: 575 [Urine:575]  Physical Exam:  General: Alert and oriented. Abdomen: Soft, Nondistended. Incisions: Clean and dry.  Lab Results: No results found for this basename: HGB:3,HCT:3 in the last 72 hours  Assessment/Plan: POD#0 Doing well.  1) Continue to monitor   Bertram Millard. Farida Mcreynolds, MD   LOS: 0 days   Chelsea Aus 01/25/2011, 6:27 PM

## 2011-01-25 NOTE — Anesthesia Postprocedure Evaluation (Signed)
  Anesthesia Post-op Note  Patient: Steve [name]  Procedure(s) Performed:  TRANSURETHRAL RESECTION OF THE PROSTATE WITH GYRUS INSTRUMENTS -    Patient Location: PACU  Anesthesia Type: General  Level of Consciousness: awake and alert   Airway and Oxygen Therapy: Patient Spontanous Breathing  Post-op Pain: mild  Post-op Assessment: Post-op Vital signs reviewed, Patient's Cardiovascular Status Stable, Respiratory Function Stable, Patent Airway and No signs of Nausea or vomiting  Post-op Vital Signs: stable  Complications: No apparent anesthesia complications

## 2011-01-25 NOTE — Op Note (Signed)
Preoperative diagnosis: 1. Bladder outlet obstruction secondary to BPH  Postoperative diagnosis:  1. Bladder outlet obstruction secondary to BPH  Procedure:  1. Cystoscopy 2. Transurethral resection of the prostate  Surgeon: Bertram Millard. Cheree Fowles, M.D.  Anesthesia: General  Complications: None  Drain: Foley catheter  EBL: Minimal  Specimens: Prostate chips  Disposition of specimens: Pathology   Indication: Steve [name] is a patient with bladder outlet obstruction secondary to benign prostatic hyperplasia. After reviewing the management options for treatment, he elected to proceed with the above surgical procedure(s). We have discussed the potential benefits and risks of the procedure, side effects of the proposed treatment, the likelihood of the patient achieving the goals of the procedure, and any potential problems that might occur during the procedure or recuperation. Informed consent has been obtained.  Description of procedure:  The patient was identified in the holding area.He received preoperative antibiotics. He was then taken to the operating room. General anesthetic was administered.  The patient was then placed in the dorsal lithotomy position, prepped and draped in the usual sterile fashion. Timeout was then performed.  A resectoscope sheath was placed using the obturator, and the resectoscope, loop and telescope were placed. A specimen of urine was sent for urine culture.  The bladder was then systematically examined in its entirety. There was no evidence of  tumors, stones, or other mucosal pathology. There were significant trabeculations, and mild generalized erythema of the urothelium.  The ureteral orifices were identified and marked so as to be avoided during the procedure.  The prostate adenoma was then resected utilizing loop cautery resection with the monopolar/bipolar cutting loop.  The prostate adenoma from the bladder neck back to the verumontanum was  resected beginning at the six o'clock position and then extended to include the right and left lobes of the prostate and anterior prostate, respectively.. Care was taken not to resect distal to the verumontanum.  Hemostasis was then achieved with the cautery and the bladder was emptied and reinspected with no significant bleeding noted at the end of the procedure.    A 22 [name], 3 way catheter was then placed into the bladder and placed on continuous bladder irrigation. The catheter was placed on traction. The patient appeared to tolerate the procedure well and without complications.  The patient was able to be awakened and transferred to the recovery unit in satisfactory condition.

## 2011-01-25 NOTE — Anesthesia Preprocedure Evaluation (Signed)
Anesthesia Evaluation  Patient identified by MRN, date of birth, ID band Patient awake    Reviewed: Allergy & Precautions, H&P , NPO status , Patient's Chart, lab work & pertinent test results  Airway Mallampati: II TM Distance: >3 FB Neck ROM: Full    Dental No notable dental hx.    Pulmonary neg pulmonary ROS, COPD clear to auscultation  Pulmonary exam normal       Cardiovascular hypertension, + Peripheral Vascular Disease and neg cardio ROS + dysrhythmias Atrial Fibrillation Regular Normal    Neuro/Psych Negative Neurological ROS  Negative Psych ROS   GI/Hepatic negative GI ROS, Neg liver ROS, GERD-  ,  Endo/Other  Negative Endocrine ROS  Renal/GU negative Renal ROS  Genitourinary negative   Musculoskeletal negative musculoskeletal ROS (+)   Abdominal   Peds negative pediatric ROS (+)  Hematology negative hematology ROS (+)   Anesthesia Other Findings   Reproductive/Obstetrics negative OB ROS                           Anesthesia Physical Anesthesia Plan  ASA: III  Anesthesia Plan: General   Post-op Pain Management:    Induction: Intravenous  Airway Management Planned: LMA  Additional Equipment:   Intra-op Plan:   Post-operative Plan:   Informed Consent: I have reviewed the patients History and Physical, chart, labs and discussed the procedure including the risks, benefits and alternatives for the proposed anesthesia with the patient or authorized representative who has indicated his/her understanding and acceptance.   Dental advisory given  Plan Discussed with: CRNA  Anesthesia Plan Comments:         Anesthesia Quick Evaluation

## 2011-01-25 NOTE — Progress Notes (Signed)
Very little drainage from foley after empty on arrival. Small clot noted in tubing. Will call DR. Dahlstedt.

## 2011-01-25 NOTE — Progress Notes (Signed)
ANTIBIOTIC CONSULT NOTE - INITIAL  Pharmacy Consult for Gentamicin Indication: post TURP  Allergies  Allergen Reactions  . Lyrica (Pregabalin) Hives    Patient Measurements: Most recent Ht 173 cm and Wt 87.5 kg  Vital Signs: Temp: 97.5 F (36.4 C) (01/16 1257) BP: 144/74 mmHg (01/16 1257) Pulse Rate: 71  (01/16 1257)  Labs: Most recent SCr 1.14 on 01/19/12 CrCl ~ 68 ml/min  Microbiology: Recent Results (from the past 720 hour(s))  SURGICAL PCR SCREEN     Status: Normal   Collection Time   01/19/11  1:04 PM      Component Value Range Status Comment   MRSA, PCR NEGATIVE  NEGATIVE  Final    Staphylococcus aureus NEGATIVE  NEGATIVE  Final     Medical History: Past Medical History  Diagnosis Date  . Hypertension   . Prostate enlargement   . History of chicken pox     childhood  . GERD (gastroesophageal reflux disease)   . Arthritis     knee and back  . AAA (abdominal aortic aneurysm)     requested stress test from [place]  . Anxiety   . Dysrhythmia     hx of atrial fib post op x 2 days   . COPD (chronic obstructive pulmonary disease)   . Shortness of breath     with exertion     Medications:  Anti-infectives     Start     Dose/Rate Route Frequency Ordered Stop   01/25/11 0734   ceFAZolin (ANCEF) IVPB 2 g/50 mL premix        2 g 100 mL/hr over 30 Minutes Intravenous 60 min pre-op 01/25/11 0734 01/25/11 0843         Assessment: 78 yo M s/p TURP with a history of indwelling catheter Request to dose Gentamicin Urine culture 1/16 pending  Plan:  Gentamicin 5 mg/kg x1 dose today   Lynann Beaver PharmD   Pager [phone] 01/25/2011 1:52 PM

## 2011-01-25 NOTE — Op Note (Signed)
Post-op note  Subjective: The patient is doing well.  No complaints.  Objective: Vital signs in last 24 hours: Temp:  [96.7 F (35.9 C)-98 F (36.7 C)] 97.4 F (36.3 C) (01/16 1455) Pulse Rate:  [61-87] 61  (01/16 1455) Resp:  [7-21] 18  (01/16 1455) BP: (122-180)/(64-83) 122/64 mmHg (01/16 1455) SpO2:  [94 %-100 %] 94 % (01/16 1455) Weight:  [87.544 kg (193 lb)] 87.544 kg (193 lb) (01/16 1300)  Intake/Output from previous day:   Intake/Output this shift: Total I/O In: 2581.3 [I.V.:2370; IV Piggyback:211.3] Out: 575 [Urine:575]  Physical Exam:  General: Alert and oriented. Abdomen: Soft, Nondistended. Incisions: Clean and dry.  Lab Results: No results found for this basename: HGB:3,HCT:3 in the last 72 hours  Assessment/Plan: POD#0   1) Continue to monitor   Bertram Millard. Anthonette Lesage, MD   LOS: 0 days   Chelsea Aus 01/25/2011, 6:07 PM

## 2011-01-25 NOTE — Progress Notes (Signed)
Foley irrigated x1 with 50ml NS. Small clot returned and good urine flow. Pt expressed relief.

## 2011-01-25 NOTE — Transfer of Care (Signed)
Immediate Anesthesia Transfer of Care Note  Patient: Steve [name]  Procedure(s) Performed:  TRANSURETHRAL RESECTION OF THE PROSTATE WITH GYRUS INSTRUMENTS -    Patient Location: PACU  Anesthesia Type: General  Level of Consciousness: awake, alert  and oriented  Airway & Oxygen Therapy: Patient Spontanous Breathing and Patient connected to face mask oxygen  Post-op Assessment: Report given to PACU RN  Post vital signs: Reviewed and stable Filed Vitals:   01/25/11 0650  BP: 145/83  Pulse: 66  Temp: 36.3 C  Resp: 20    Complications: No apparent anesthesia complications

## 2011-01-25 NOTE — Anesthesia Procedure Notes (Signed)
Procedure Name: Intubation Date/Time: 01/25/2011 8:51 AM Performed by: Hulan Fess Pre-anesthesia Checklist: Patient identified, Emergency Drugs available, Suction available, Patient being monitored and Timeout performed Patient Re-evaluated:Patient Re-evaluated prior to inductionOxygen Delivery Method: Circle System Utilized Preoxygenation: Pre-oxygenation with 100% oxygen Intubation Type: IV induction Ventilation: Mask ventilation without difficulty Laryngoscope Size: Mac and 3 Grade View: Grade I Tube type: Oral Tube size: 8.0 mm Number of attempts: 1 Placement Confirmation: ETT inserted through vocal cords under direct vision Secured at: 22 cm Tube secured with: Tape Dental Injury: Teeth and Oropharynx as per pre-operative assessment

## 2011-01-25 NOTE — Progress Notes (Signed)
C/O "heartburn". Denies hx of heartburn, reflux, taking meds for such. Dr. Okey Dupre in and aware. Will write ordres

## 2011-01-26 MED ORDER — ASPIRIN EC 81 MG PO TBEC: 81.0000 mg | DELAYED_RELEASE_TABLET | Freq: Every day | ORAL | Status: DC

## 2011-01-26 MED ORDER — CIPROFLOXACIN HCL 250 MG PO TABS: 250.0000 mg | ORAL_TABLET | Freq: Two times a day (BID) | ORAL | Status: AC

## 2011-01-26 MED ORDER — DSS 100 MG PO CAPS: 100.0000 mg | ORAL_CAPSULE | Freq: Two times a day (BID) | ORAL | Status: AC

## 2011-01-26 MED ORDER — DEXTROSE 5 % IV SOLN
1.0000 g | Freq: Once | INTRAVENOUS | Status: AC
  Administered 2011-01-26: 1 g via INTRAVENOUS
  Filled 2011-01-26: qty 10

## 2011-01-26 NOTE — Progress Notes (Signed)
1 Day Post-Op Subjective: The patient is doing well.  No nausea or vomiting. Pain is adequately controlled. Catheter not out yet.  Objective: Vital signs in last 24 hours: Temp:  [96.7 F (35.9 C)-98 F (36.7 C)] 97.4 F (36.3 C) (01/17 0541) Pulse Rate:  [55-87] 59  (01/17 0541) Resp:  [7-21] 18  (01/17 0541) BP: (122-180)/(63-83) 135/69 mmHg (01/17 0541) SpO2:  [93 %-100 %] 93 % (01/17 0541) Weight:  [87.544 kg (193 lb)] 87.544 kg (193 lb) (01/16 1300)  Intake/Output from previous day: 01/16 0701 - 01/17 0700 In: 3901.3 [P.O.:240; I.V.:3250; IV Piggyback:411.3] Out: 16109 [Urine:12375] Intake/Output this shift:    Physical Exam:  Pt comfortable  Lab Results: No results found for this basename: HGB:3,HCT:3 in the last 72 hours  Assessment/Plan: POD 1 TUR-P. Pt doing well. Cath. Not out yet.  Will d/c cath for voiiding trial and d/c if appropriate.   Bertram Millard. Carynn Felling, MD  01/26/2011, 7:35 AM

## 2011-01-26 NOTE — Progress Notes (Signed)
Patient voided pink, clear urine twice. Will discharge patient as previously ordered by Dr. Retta Diones.

## 2011-01-27 ENCOUNTER — Encounter (HOSPITAL_COMMUNITY): Payer: Self-pay | Admitting: Urology

## 2011-01-27 NOTE — Procedures (Signed)
NAME:  Steve [name], WANT NO.:  [id number]  MEDICAL RECORD NO.:  [id number]          PATIENT TYPE:  OUT  LOCATION:  SLEEP CENTER                 FACILITY:  [facility]  PHYSICIAN:  Coralyn Helling, MD        DATE OF BIRTH:  September 26, 1933  DATE OF STUDY:  01/17/2011                           NOCTURNAL POLYSOMNOGRAM  REFERRING PHYSICIAN:  Leslye Peer, MD  INDICATION FOR STUDY:  Steve [name] is a 78 year old male who has a history of COPD.  He had also undergone a overnight polysomnogram and was found to have severe obstructive sleep apnea with an apnea-hypopnea index of 76.4, and oxygen saturation nadir of 85%.  He returns to the sleep lab for a CPAP titration study.  Height is 5 feet 8 inches, weight is 185 pounds, BMI is 28, neck size is 16 inches.  EPWORTH SLEEPINESS SCORE:  11.  MEDICATIONS:  Loratadine, Lopressor, albuterol, Xanax, Flomax, aspirin, Ambien, citalopram, Spiriva.  SLEEP ARCHITECTURE:  Total recording time was 376 minutes.  Total sleep time was 256 minutes.  Sleep efficiency was 68%.  Sleep latency is 49 minutes.  REM latency was 237 minutes.  This study was notable for the lack of stage 3 sleep.  He slept in both the supine and nonsupine positions.  RESPIRATORY RATE:  The average respiratory rate was 21.  Moderate snoring was noted by the technician.  The patient was initially started on CPAP of 5 and increased to 11 cm of water.  He was then transitioned to BiPAP due to frequent central apneic events.  He was transitioned to BiPAP 13/9 and increased to 16/10.  It appeared that the optimal setting would be BiPAP of 15/9.  With this pressure setting, he had good control of his obstructive sleep apnea, but still did have occasional episodes of central apneic events, but these were not associated with a significant oxygen desaturations or sleep disruption.  OXYGEN DATA:  The baseline oxygenation was 97%.  The oxygen saturation nadir was 87%.  The study  was conducted without the use of supplemental oxygen.  CARDIAC DATA:  The average heart rate is 46 and the rhythm strip showed sinus bradycardia with sinus rhythm and occasional PVCs.  MOVEMENT-PARASOMNIA:  The patient had no restroom trips and the periodic limb movement index was 0.  IMPRESSION-RECOMMENDATIONS:  The patient had good control of his obstructive sleep apnea with BiPAP of 15/9 cm of water pressure.  He was not able to tolerate CPAP therapy due to frequent central apneic events. He still had persistence of central apneic events even while using BiPAP but these were much reduced.  I would recommend that the patient be started on BiPAP of 15/9 cm water and monitored for his clinical response.  If he continues to have sleep disruption, then consideration may need to be given to adding supplemental oxygen to improve control of his central apneic events in addition to continuing BiPAP therapy.     Coralyn Helling, MD Diplomat, American Board of Sleep Medicine    VS/MEDQ  D:  01/26/2011 16:39:26  T:  01/27/2011 06:45:18  Job:  [id number]

## 2011-01-31 ENCOUNTER — Encounter (HOSPITAL_BASED_OUTPATIENT_CLINIC_OR_DEPARTMENT_OTHER): Payer: Medicare HMO

## 2011-02-16 ENCOUNTER — Telehealth: Payer: Self-pay | Admitting: Emergency Medicine

## 2011-02-16 DIAGNOSIS — G4733 Obstructive sleep apnea (adult) (pediatric): Secondary | ICD-10-CM

## 2011-02-16 NOTE — Telephone Encounter (Signed)
Called and spoke with pt's spouse, Dewayne Hatch.  Ann requesting pt's cpap titration study results that were done the "end of January."  RB, please advise of results.  Thanks!Marland Kitchen

## 2011-02-20 ENCOUNTER — Encounter: Payer: Self-pay | Admitting: Vascular Surgery

## 2011-02-21 ENCOUNTER — Ambulatory Visit: Payer: Medicare HMO | Admitting: Vascular Surgery

## 2011-02-21 DIAGNOSIS — Z79899 Other long term (current) drug therapy: Secondary | ICD-10-CM

## 2011-02-21 DIAGNOSIS — J449 Chronic obstructive pulmonary disease, unspecified: Secondary | ICD-10-CM

## 2011-02-21 DIAGNOSIS — G4733 Obstructive sleep apnea (adult) (pediatric): Secondary | ICD-10-CM

## 2011-02-22 NOTE — Telephone Encounter (Signed)
Spoke with pt's wife and reviewed the titration study w her. Dr Craige Cotta recommends start with BPAP 15/9 and then do ONO later to confirm adequate oxygenation in setting central apneas. I will order BiPAP now - he already gets his O2 through [place].

## 2011-02-22 NOTE — Telephone Encounter (Signed)
Order faxed to [facility] by alida to start bipap

## 2011-03-02 ENCOUNTER — Telehealth: Payer: Self-pay | Admitting: Emergency Medicine

## 2011-03-02 ENCOUNTER — Encounter: Payer: Self-pay | Admitting: Internal Medicine

## 2011-03-02 ENCOUNTER — Ambulatory Visit (INDEPENDENT_AMBULATORY_CARE_PROVIDER_SITE_OTHER): Payer: Medicare Other | Admitting: Internal Medicine

## 2011-03-02 DIAGNOSIS — R05 Cough: Secondary | ICD-10-CM | POA: Insufficient documentation

## 2011-03-02 DIAGNOSIS — K439 Ventral hernia without obstruction or gangrene: Secondary | ICD-10-CM

## 2011-03-02 MED ORDER — HYDROCOD POLST-CHLORPHEN POLST 10-8 MG/5ML PO LQCR: 5.0000 mL | Freq: Two times a day (BID) | ORAL | Status: DC | PRN

## 2011-03-02 NOTE — Progress Notes (Signed)
  Subjective:    Patient ID: Steve [name], male    DOB: 1933/03/12, 78 y.o.   MRN: [id number]  HPI Pt presents to clinic for evaluation of cough. Notes one week h/o cough initially productive for yellow sputum. Is improving and states 50% better. No f/c or wheezing. Since coughing has noted intermittent st mass of upper midline abdomen. Occurs with cough or increased pressure. No associated pain, tenderness, erythema, hardness. Area always goes back down/in. No other complaints.  Past Medical History  Diagnosis Date  . Hypertension   . Prostate enlargement   . History of chicken pox     childhood  . GERD (gastroesophageal reflux disease)   . Arthritis     knee and back  . AAA (abdominal aortic aneurysm)     requested stress test from [place]  . Anxiety   . Dysrhythmia     hx of atrial fib post op x 2 days   . COPD (chronic obstructive pulmonary disease)   . Shortness of breath     with exertion    Past Surgical History  Procedure Date  . Knee surgery 1957    left knee arthotomy  . Abdominal aortic aneurysm repair 11/14/2010    Procedure: ANEURYSM ABDOMINAL AORTIC REPAIR;  Surgeon: Larina Earthly, MD;  Location[facility] OR;  Service: Vascular;  Laterality: N/A;  . Transurethral resection of prostate 01/25/2011    Procedure: TRANSURETHRAL RESECTION OF THE PROSTATE WITH GYRUS INSTRUMENTS;  Surgeon: Marcine Matar, MD;  Location: [facility] ORS;  Service: Urology;  Laterality: N/A;       reports that he has been smoking Cigarettes.  He has a 55 pack-year smoking history. He has never used smokeless tobacco. He reports that he does not drink alcohol or use illicit drugs. family history includes Arthritis in his mother; Diabetes in his mother; Heart failure in his daughter and mother; Hypertension in his mother; and Other in his daughter. Allergies  Allergen Reactions  . Lyrica (Pregabalin) Hives     Review of Systems see hpi     Objective:   Physical Exam  Nursing note and vitals  reviewed. Constitutional: He appears well-developed and well-nourished. No distress.  HENT:  Head: Normocephalic and atraumatic.  Eyes: Conjunctivae are normal. No scleral icterus.  Pulmonary/Chest: Effort normal and breath sounds normal. No respiratory distress. He has no wheezes. He has no rales.  Abdominal: Soft.       Midline incisional hernia noted with valsalva. Reducible. NT without firmness  Skin: He is not diaphoretic.          Assessment & Plan:

## 2011-03-02 NOTE — Assessment & Plan Note (Signed)
Suspect viral etiology as he is demonstrating spontaneous improvement. tussionex prn and cautioned re possible sedating effect. Followup if no improvement or worsening.

## 2011-03-02 NOTE — Telephone Encounter (Signed)
Will forward to RB as an FYI 

## 2011-03-02 NOTE — Assessment & Plan Note (Signed)
Likely incisional hernia. Asx. States has f/u with surgery next week and will address there.

## 2011-03-06 ENCOUNTER — Encounter: Payer: Self-pay | Admitting: Vascular Surgery

## 2011-03-07 ENCOUNTER — Ambulatory Visit (INDEPENDENT_AMBULATORY_CARE_PROVIDER_SITE_OTHER): Payer: Medicare Other | Admitting: Vascular Surgery

## 2011-03-07 ENCOUNTER — Encounter: Payer: Self-pay | Admitting: Vascular Surgery

## 2011-03-07 VITALS — BP 139/84 | HR 69 | Resp 20 | Ht 69.0 in | Wt 186.0 lb

## 2011-03-07 DIAGNOSIS — I714 Abdominal aortic aneurysm, without rupture, unspecified: Secondary | ICD-10-CM | POA: Insufficient documentation

## 2011-03-07 NOTE — Progress Notes (Signed)
Patient is a 14 followup of his open aneurysm repair for expanding abdominal aortic aneurysm. Surgery was November 2012. He did pulmonary difficulty requiring intubation following the procedure I eventually resolved this and recovered. Loose from 213 pounds down to 182. He is eating some of this weight back. He reports some diminished and the otherwise his return to his usual function.  Physical exam: He does have a 2+ dorsalis pedis pulses bilaterally. His abdominal wound is well healed. He does have a new hernia in the upper segment below the level of the xiphoid. This does not have any evidence of incarceration and appears to several centimeters wide. Is not causing him any pain.  Impression and plan stable status post open aneurysm repair. He does have a ventral incisional hernia in the upper portion of his wound. I I have suggested that we see him again in 2 months for continued followup. I did explain that he is having symptoms related to this for his expanding he will require treatment. Explained laparoscopic repair with general surgery certainly feel this would be his best option. We will see him for further discussion in 2 months I did discuss symptoms of incarceration in this report may should this occur

## 2011-03-21 ENCOUNTER — Ambulatory Visit: Payer: BC Managed Care – PPO | Admitting: Emergency Medicine

## 2011-03-23 ENCOUNTER — Ambulatory Visit: Payer: Self-pay | Admitting: Emergency Medicine

## 2011-04-06 ENCOUNTER — Telehealth: Payer: Self-pay | Admitting: Internal Medicine

## 2011-04-06 MED ORDER — CITALOPRAM HYDROBROMIDE 20 MG PO TABS: 20.0000 mg | ORAL_TABLET | Freq: Every day | ORAL | Status: DC

## 2011-04-06 MED ORDER — ALPRAZOLAM 0.25 MG PO TABS: 0.2500 mg | ORAL_TABLET | Freq: Three times a day (TID) | ORAL | Status: DC | PRN

## 2011-04-06 NOTE — Telephone Encounter (Signed)
There are no future appointments on file for this patient. Is it ok to refill these medications?

## 2011-04-06 NOTE — Telephone Encounter (Signed)
Refill- alprazolam 0.25mg  tab. Take one tablet by mouth three times daily as needed for anxiety. Qty 90 last fill 3.1.13  Refill- citalopram 20mg  tab. Take one tablet by mouth every day. Qty 30 last fill 3.1.13

## 2011-04-06 NOTE — Telephone Encounter (Signed)
Verbal refill order provided to pharmacist.  

## 2011-04-06 NOTE — Telephone Encounter (Signed)
Ok rf 3 xanax. rf 11 celexa

## 2011-04-10 ENCOUNTER — Encounter: Payer: Self-pay | Admitting: Vascular Surgery

## 2011-04-11 ENCOUNTER — Ambulatory Visit (INDEPENDENT_AMBULATORY_CARE_PROVIDER_SITE_OTHER): Payer: Medicare Other | Admitting: Vascular Surgery

## 2011-04-11 ENCOUNTER — Encounter: Payer: Self-pay | Admitting: Vascular Surgery

## 2011-04-11 VITALS — BP 183/79 | HR 58 | Temp 97.5°F | Ht 69.0 in | Wt 200.0 lb

## 2011-04-11 DIAGNOSIS — K439 Ventral hernia without obstruction or gangrene: Secondary | ICD-10-CM

## 2011-04-11 DIAGNOSIS — I714 Abdominal aortic aneurysm, without rupture: Secondary | ICD-10-CM

## 2011-04-11 NOTE — Progress Notes (Signed)
The patient presents today for all above his open aneurysm repair in November of 2012. He had some pulmonary difficulty around the time of surgery requiring intubation for several days. He does have significant underlying pulmonary dysfunction. He does have shortness of breath with exertion and does use home oxygen therapy. He has returned to his usual baseline stamina but on my last visit with him in February, I did notice a ventral incisional hernia. He is here today for followup.  Past Medical History  Diagnosis Date  . Hypertension   . Prostate enlargement   . History of chicken pox     childhood  . GERD (gastroesophageal reflux disease)   . Arthritis     knee and back  . AAA (abdominal aortic aneurysm)     requested stress test from [place]  . Anxiety   . Dysrhythmia     hx of atrial fib post op x 2 days   . COPD (chronic obstructive pulmonary disease)   . Shortness of breath     with exertion     History  Substance Use Topics  . Smoking status: Current Everyday Smoker -- 1.0 packs/day for 55 years    Types: Cigarettes  . Smokeless tobacco: Never Used  . Alcohol Use: No     no etoh in 28 years     Family History  Problem Relation Age of Onset  . Arthritis Mother   . Heart failure Mother   . Hypertension Mother   . Diabetes Mother   . Heart failure Daughter     deceased age 17  . Other Daughter     deceased age 67 MVA    Allergies  Allergen Reactions  . Lyrica (Pregabalin) Hives    Current outpatient prescriptions:albuterol (VENTOLIN HFA) 108 (90 BASE) MCG/ACT inhaler, Inhale 2 puffs into the lungs every 4 (four) hours as needed. For shortness of breath, Disp: , Rfl: ;  ALPRAZolam (XANAX) 0.25 MG tablet, Take 1 tablet (0.25 mg total) by mouth 3 (three) times daily as needed. For anxiety, Disp: 90 tablet, Rfl: 3;  aspirin EC 81 MG tablet, Take 1 tablet (81 mg total) by mouth daily., Disp: 1 tablet, Rfl: 0 citalopram (CELEXA) 20 MG tablet, Take 1 tablet (20 mg total)  by mouth daily after breakfast., Disp: 30 tablet, Rfl: 11;  loratadine (CLARITIN) 10 MG tablet, Take 10 mg by mouth daily. , Disp: , Rfl: ;  tiotropium (SPIRIVA HANDIHALER) 18 MCG inhalation capsule, Place 1 capsule (18 mcg total) into inhaler and inhale daily., Disp: 30 capsule, Rfl: 3 chlorpheniramine-HYDROcodone (TUSSIONEX PENNKINETIC ER) 10-8 MG/5ML LQCR, Take 5 mLs by mouth every 12 (twelve) hours as needed., Disp: 120 mL, Rfl: 0;  traMADol (ULTRAM) 50 MG tablet, TAKE ONE TO TWO TABLETS BY MOUTH EVERY 8 HOURS AS NEEDED FOR PAIN *MAXIMUM OF 8 TABLETS PER DAY*, Disp: 30 tablet, Rfl: 0;  zolpidem (AMBIEN) 5 MG tablet, Take 1 tablet (5 mg total) by mouth at bedtime as needed for sleep., Disp: 30 tablet, Rfl: 0 DISCONTD: citalopram (CELEXA) 20 MG tablet, Take 1 tablet (20 mg total) by mouth daily., Disp: 30 tablet, Rfl: 6  BP 183/79  Pulse 58  Temp 97.5 F (36.4 C)  Ht 5\' 9"  (1.753 m)  Wt 200 lb (90.719 kg)  BMI 29.53 kg/m2  Body mass index is 29.53 kg/(m^2).       Physical exam: Well-developed well-nourished white male appearing stated age in no distress. Abdominal exam reveals a well-healed midline incision. He does  have a large ventral incisional hernia in the upper aspect of his incision. This is not incarcerated and easily reducible. He does have a 2+ femoral and 2+ dorsalis pedis pulses bilaterally.  Impression and plan: Patent aorta iliac bypass for aneurysm repair. He does have an enlarging ventral incisional hernia. I have referred him to Dr. Darnell Level for further consultation. Feel that he may be a candidate for laparoscopic hernia repair if felt appropriate by Dr. [name]

## 2011-04-13 NOTE — Progress Notes (Signed)
Addended by: Sharee Pimple on: 04/13/2011 10:58 AM   Modules accepted: Orders

## 2011-04-21 ENCOUNTER — Ambulatory Visit (INDEPENDENT_AMBULATORY_CARE_PROVIDER_SITE_OTHER): Payer: Medicare Other | Admitting: Emergency Medicine

## 2011-04-21 ENCOUNTER — Encounter: Payer: Self-pay | Admitting: Emergency Medicine

## 2011-04-21 VITALS — BP 130/70 | HR 56 | Temp 97.7°F | Ht 69.0 in | Wt 200.2 lb

## 2011-04-21 DIAGNOSIS — G4730 Sleep apnea, unspecified: Secondary | ICD-10-CM

## 2011-04-21 DIAGNOSIS — J449 Chronic obstructive pulmonary disease, unspecified: Secondary | ICD-10-CM

## 2011-04-21 NOTE — Patient Instructions (Signed)
We will decrease the pressure on your CPAP to see if you can tolerate. Then we can increase it later The most important thing you need to do is quit smoking. We agreed that you would try to cut back to less than 1 pack a day by our next visit Continue your Spiriva daily and ProAir when you need it Follow with Dr Zully Frane in 3 month 

## 2011-04-21 NOTE — Assessment & Plan Note (Signed)
Unable to tolerate 15/9 biPAP. Will try changing to to CPAP 9, see if he can tolerate. If he gets used to it then will try going up to 15, then possibly back to BiPAP

## 2011-04-21 NOTE — Progress Notes (Signed)
Subjective:    Patient ID: Steve [name], male    DOB: 1933-04-10, 78 y.o.   MRN: [id number]  HPI 78 year old male with a known history of COPD (former smoker) and  AAA w/ planned aneurysm repair on 11/14/2010. Patient had postop complications with respiratory acidosis and hypoxemia requiring intubation.  Critical care team was consulted  11/28/2010 Post hospital  Pt returns for post hospital followup. Patient was hospitalized November through November 12 for planned abdominal aortic aneurysm repair. Patient had postop complications with respiratory acidosis and hypoxemia requiring intubation and vent support. Critical care team with initial consult. Patient had known underlying COPD and was on home O2 followed by his primary care doctor. Patient was treated with aggressive pulmonary hygiene along with IV antibiotics and diuresis and was successfully extubated on November 8. Patient was started on Spiriva. Echocardiogram during admission showed normal left ventricular function. Patient did have atrial fibrillation during hospitalization. He was started on metoprolol and converted over to normal sinus rhythm prior to discharge. This was felt likely to stress response and hypokalemia. Patient's Norvasc was held prior to discharge. Patient does complain that he believes he has sleep apnea and wants to be tested for this. Patient's wife says that he snores  and stops breathing intermittently. He complains of daytime hypersomnolence  -tired all the time.   Since discharge. Patient says he is slowly improving. He denies any cough, chest pain, wheezing, or increased leg swelling. Continues to feel extremely weak. It wears out easily with shortness of breath with activity.  ROV 12/29/10 --77yo  (> 120 pk-yrs),  follows up after acute episode hypercapneic resp failure while hospitalized post-AAA repair 11/12. He established care here 11/12, was seen by [place] in house. Had PFT today = normal AF to MILD AFL, no BD  response, decreased RV, decreased DLCO. He also had PSG 12/14. He is seeing Dr Hillis Range for TURP in January. He is unfortunately smoking again - 1 pk/day. He was started on Spiriva at time of discharge.    PULMONARY FUNCTON TEST 12/29/2010  FVC 3.59  FEV1 2.55  FEV1/FVC 71  FVC  % Predicted 91  FEV % Predicted 98  FeF 25-75 1.79  FeF 25-75 % Predicted 2.33   ROV 04/21/11 -- Follows up for dyspnea, tobacco use, OSA/OHS. We ordered BiPAP 15/9 since last visit, but he is unable to wear because the pressure is too high. He is smoking 2pk/day - pft showed mild AFL last time. He is on Spiriva, not sure it is helping him. Uses SABA rarely.      Objective:   Physical Exam GEN: A/Ox3; pleasant , NAD, elderly   HEENT:  [place]/AT,  EACs-clear, TMs-wnl, NOSE-clear, THROAT-clear, no lesions, no postnasal drip or exudate noted.   NECK:  Supple w/ fair ROM; no JVD; normal carotid impulses w/o bruits; no thyromegaly or nodules palpated; no lymphadenopathy.  RESP  Coarse BS w/o, wheezes/ rales/ or rhonchi.no accessory muscle use, no dullness to percussion  CARD:  RRR, no m/r/g  , tr peripheral edema, pulses intact, no cyanosis or clubbing.  Musco: Warm bil, no deformities or joint swelling noted.   Neuro: alert, no focal deficits noted.    Skin: Warm, no lesions or rashes       Assessment & Plan:  COPD (chronic obstructive pulmonary disease) We will decrease the pressure on your CPAP to see if you can tolerate. Then we can increase it later The most important thing you need to do is quit smoking.  We agreed that you would try to cut back to less than 1 pack a day by our next visit Continue your Spiriva daily and ProAir when you need it Follow with Dr Delton Coombes in 3 month  Sleep apnea Unable to tolerate 15/9 biPAP. Will try changing to to CPAP 9, see if he can tolerate. If he gets used to it then will try going up to 15, then possibly back to BiPAP

## 2011-04-21 NOTE — Assessment & Plan Note (Signed)
We will decrease the pressure on your CPAP to see if you can tolerate. Then we can increase it later The most important thing you need to do is quit smoking. We agreed that you would try to cut back to less than 1 pack a day by our next visit Continue your Spiriva daily and ProAir when you need it Follow with Dr Delton Coombes in 3 month

## 2011-04-27 ENCOUNTER — Telehealth: Payer: Self-pay | Admitting: Emergency Medicine

## 2011-04-27 DIAGNOSIS — G4730 Sleep apnea, unspecified: Secondary | ICD-10-CM

## 2011-04-27 NOTE — Telephone Encounter (Signed)
I spoke with spouse and she is requesting to have pt's cpap pressure lowered. She states an order was suppose to be sent at [facility] 04/21/11. I looked at RB last note and order was suppose to be sent to decrease CPAP to 9 cm. This was not done. I have sent order and spouse is aware. Nothing further was needed

## 2011-05-02 ENCOUNTER — Telehealth: Payer: Self-pay | Admitting: Emergency Medicine

## 2011-05-02 NOTE — Telephone Encounter (Signed)
I spoke with Steve [name] at [facility] and order was sent for change cpap pressure to 9, but the pt is on a bipap. Below is what Dr. Delton Coombes discussed at pt last visit.   Sleep apnea - Steve [name]., MD 04/21/2011 12:18 PM Signed  Unable to tolerate 15/9 biPAP. Will try changing to to CPAP 9, see if he can tolerate. If he gets used to it then will try going up to 15, then possibly back to BiPAP  I advised Steve [name] of the above and she states she will set bipap to 9/9 and this will be same as cpap 9 so pt doe snto have to get a whole new machine. Nothing further needed.Steve [name], CMA

## 2011-05-09 ENCOUNTER — Ambulatory Visit: Payer: Medicare Other | Admitting: Vascular Surgery

## 2011-05-23 ENCOUNTER — Encounter (INDEPENDENT_AMBULATORY_CARE_PROVIDER_SITE_OTHER): Payer: Self-pay | Admitting: Surgery

## 2011-05-23 ENCOUNTER — Ambulatory Visit (INDEPENDENT_AMBULATORY_CARE_PROVIDER_SITE_OTHER): Payer: Medicare Other | Admitting: Surgery

## 2011-05-23 VITALS — BP 122/84 | HR 62 | Temp 97.8°F | Resp 18 | Ht 69.0 in | Wt 203.2 lb

## 2011-05-23 DIAGNOSIS — K432 Incisional hernia without obstruction or gangrene: Secondary | ICD-10-CM

## 2011-05-23 NOTE — Patient Instructions (Signed)
Hernia  A hernia occurs when an internal organ pushes out through a weak spot in the abdominal wall. Hernias most commonly occur in the groin and around the navel. Hernias often can be pushed back into place (reduced). Most hernias tend to get worse over time. Some abdominal hernias can get stuck in the opening (irreducible or incarcerated hernia) and cannot be reduced. An irreducible abdominal hernia which is tightly squeezed into the opening is at risk for impaired blood supply (strangulated hernia). A strangulated hernia is a medical emergency. Because of the risk for an irreducible or strangulated hernia, surgery may be recommended to repair a hernia.  CAUSES    Heavy lifting.   Prolonged coughing.   Straining to have a bowel movement.   A cut (incision) made during an abdominal surgery.  HOME CARE INSTRUCTIONS    Bed rest is not required. You may continue your normal activities.   Avoid lifting more than 10 pounds (4.5 kg) or straining.   Cough gently. If you are a smoker it is best to stop. Even the best hernia repair can break down with the continual strain of coughing. Even if you do not have your hernia repaired, a cough will continue to aggravate the problem.   Do not wear anything tight over your hernia. Do not try to keep it in with an outside bandage or truss. These can damage abdominal contents if they are trapped within the hernia sac.   Eat a normal diet.   Avoid constipation. Straining over long periods of time will increase hernia size and encourage breakdown of repairs. If you cannot do this with diet alone, stool softeners may be used.  SEEK IMMEDIATE MEDICAL CARE IF:    You have a fever.   You develop increasing abdominal pain.   You feel nauseous or vomit.   Your hernia is stuck outside the abdomen, looks discolored, feels hard, or is tender.   You have any changes in your bowel habits or in the hernia that are unusual for you.   You have increased pain or swelling around the  hernia.   You cannot push the hernia back in place by applying gentle pressure while lying down.  MAKE SURE YOU:    Understand these instructions.   Will watch your condition.   Will get help right away if you are not doing well or get worse.  Document Released: 12/26/2004 Document Revised: 12/15/2010 Document Reviewed: 08/15/2007  [employer] Patient Information 2012 [employer]

## 2011-05-23 NOTE — Progress Notes (Signed)
Chief Complaint  Patient presents with  . New Evaluation    Ventral Incisional Hernia - referral from Dr. Tawanna Cooler Early    HISTORY: Patient is a 78 year old white male referred by his vascular surgeon for evaluation of ventral incisional hernia. Patient underwent repair of abdominal aortic aneurysm in November 2012. Approximately 3 months ago the patient developed a bulge in the epigastrium. This has continued to increase in size. He has had minor pain. He has had no signs or symptoms of intestinal obstruction but he does require laxative use approximately once a week. His only prior abdominal surgery with prostatectomy.  Following aneurysm repair, the patient was on a ventilator due to his underlying COPD. He is followed by his pulmonologist.  Patient presents today for evaluation for repair of an enlarging ventral incisional hernia.  Past Medical History  Diagnosis Date  . Hypertension   . Prostate enlargement   . History of chicken pox     childhood  . GERD (gastroesophageal reflux disease)   . Arthritis     knee and back  . AAA (abdominal aortic aneurysm)     requested stress test from [place]  . Anxiety   . Dysrhythmia     hx of atrial fib post op x 2 days   . COPD (chronic obstructive pulmonary disease)   . Shortness of breath     with exertion      Current Outpatient Prescriptions  Medication Sig Dispense Refill  . albuterol (VENTOLIN HFA) 108 (90 BASE) MCG/ACT inhaler Inhale 2 puffs into the lungs every 4 (four) hours as needed. For shortness of breath      . ALPRAZolam (XANAX) 0.25 MG tablet Take 1 tablet (0.25 mg total) by mouth 3 (three) times daily as needed. For anxiety  90 tablet  3  . aspirin EC 81 MG tablet Take 1 tablet (81 mg total) by mouth daily.  1 tablet  0  . citalopram (CELEXA) 20 MG tablet Take 1 tablet (20 mg total) by mouth daily after breakfast.  30 tablet  11  . loratadine (CLARITIN) 10 MG tablet Take 10 mg by mouth daily.       Marland Kitchen tiotropium (SPIRIVA  HANDIHALER) 18 MCG inhalation capsule Place 1 capsule (18 mcg total) into inhaler and inhale daily.  30 capsule  3  . DISCONTD: citalopram (CELEXA) 20 MG tablet Take 1 tablet (20 mg total) by mouth daily.  30 tablet  6     Allergies  Allergen Reactions  . Lyrica (Pregabalin) Hives     Family History  Problem Relation Age of Onset  . Arthritis Mother   . Heart failure Mother   . Hypertension Mother   . Diabetes Mother   . Heart failure Daughter     deceased age 51  . Other Daughter     deceased age 76 MVA     History   Social History  . Marital Status: Married    Spouse Name: N/A    Number of Children: N/A  . Years of Education: N/A   Social History Main Topics  . Smoking status: Current Everyday Smoker -- 2.0 packs/day for 55 years    Types: Cigarettes  . Smokeless tobacco: Never Used  . Alcohol Use: No     no etoh in 28 years   . Drug Use: No  . Sexually Active: None   Other Topics Concern  . None   Social History Narrative  . None     REVIEW OF SYSTEMS -  PERTINENT POSITIVES ONLY: Mild intermittent pain along the costal margins, intermittent laxative use  EXAM: Filed Vitals:   05/23/11 0835  BP: 122/84  Pulse: 62  Temp: 97.8 F (36.6 C)  Resp: 18    HEENT: normocephalic; pupils equal and reactive; sclerae clear; dentition good; mucous membranes moist NECK:  symmetric on extension; no palpable anterior or posterior cervical lymphadenopathy; no supraclavicular masses; no tenderness CHEST: clear to auscultation bilaterally without rales, rhonchi, or wheezes CARDIAC: regular rate and rhythm without significant murmur; peripheral pulses are full ABDOMEN: soft without distension; bowel sounds present; no mass; no hepatosplenomegaly; Large ventral incisional hernia in the epigastrium extending from the xiphoid process to nearly the umbilicus. Rectus muscles were separated approximately 8 cm. Hernia is spontaneously reducible. EXT:  non-tender without  edema; no deformity NEURO: no gross focal deficits; no sign of tremor   LABORATORY RESULTS: See [facility] (CHL-Epic) for most recent results   RADIOLOGY RESULTS: See [facility] (CHL-Epic) for most recent results   IMPRESSION: #1 ventral incisional hernia, reducible, mildly symptomatic #2 status post abdominal aortic aneurysm repair #3 COPD requiring ventilator support postoperative  PLAN: I discussed the above findings with the patient and his wife today in the office at length.  Due to the location and size of his defect, I have recommended open repair instead of laparoscopic repair. We discussed the procedure at length and the hospital stay to be anticipated. We discussed the need for postoperative drains. We discussed the potential for recurrent hernia being at least 10%. We discussed potential pulmonary complications given his underlying COPD and his previous need for mechanical ventilation following surgery.  Patient and his wife understand and wish to proceed. We will schedule surgery in the near future at a time convenient for the patient.  The risks and benefits of the procedure have been discussed at length with the patient.  The patient understands the proposed procedure, potential alternative treatments, and the course of recovery to be expected.  All of the patient's questions have been answered at this time.  The patient wishes to proceed with surgery.  Velora Heckler, MD, FACS General & Endocrine Surgery [facility]   Visit Diagnoses: 1. Incisional hernia     Primary Care Physician: Letitia Libra, Ala Dach, MD, MD  Vascular Surgeon:  Dr. Tawanna Cooler Early  Pulmonologist:  Dr. Jasmine Awe

## 2011-05-31 ENCOUNTER — Ambulatory Visit (INDEPENDENT_AMBULATORY_CARE_PROVIDER_SITE_OTHER): Payer: Medicare Other | Admitting: Internal Medicine

## 2011-05-31 ENCOUNTER — Encounter: Payer: Self-pay | Admitting: Neurology

## 2011-05-31 ENCOUNTER — Encounter: Payer: Self-pay | Admitting: Internal Medicine

## 2011-05-31 VITALS — BP 112/60 | HR 64 | Temp 97.4°F | Resp 22 | Wt 205.0 lb

## 2011-05-31 DIAGNOSIS — R2681 Unsteadiness on feet: Secondary | ICD-10-CM | POA: Insufficient documentation

## 2011-05-31 DIAGNOSIS — R269 Unspecified abnormalities of gait and mobility: Secondary | ICD-10-CM

## 2011-05-31 NOTE — Assessment & Plan Note (Signed)
Neurology exam nonfocal. Head mri 8/12. Recommend neurology consult

## 2011-05-31 NOTE — Progress Notes (Signed)
  Subjective:    Patient ID: Riki Altes, male    DOB: 06/20/1933, 78 y.o.   MRN: [id number]  HPI Pt presents to clinic for evaluation of recurrent falls.  Has been a chronic problem but in the past seemingly associated with other s/s such as dizziness. Denies dizziness or neurologic sx. Feels unsteady with standing or walking. No postural association. Denies presyncope or syncope. No recent injury. Scheduled for hernia surgery in near future. Past Medical History  Diagnosis Date  . Hypertension   . Prostate enlargement   . History of chicken pox     childhood  . GERD (gastroesophageal reflux disease)   . Arthritis     knee and back  . AAA (abdominal aortic aneurysm)     requested stress test from [place]  . Anxiety   . Dysrhythmia     hx of atrial fib post op x 2 days   . COPD (chronic obstructive pulmonary disease)   . Shortness of breath     with exertion    Past Surgical History  Procedure Date  . Knee surgery 1957    left knee arthotomy  . Abdominal aortic aneurysm repair 11/14/2010    Procedure: ANEURYSM ABDOMINAL AORTIC REPAIR;  Surgeon: Larina Earthly, MD;  Location: [facility] OR;  Service: Vascular;  Laterality: N/A;  . Transurethral resection of prostate 01/25/2011    Procedure: TRANSURETHRAL RESECTION OF THE PROSTATE WITH GYRUS INSTRUMENTS;  Surgeon: Marcine Matar, MD;  Location: [facility] ORS;  Service: Urology;  Laterality: N/A;       reports that he has been smoking Cigarettes.  He has a 110 pack-year smoking history. He has never used smokeless tobacco. He reports that he does not drink alcohol or use illicit drugs. family history includes Arthritis in his mother; Diabetes in his mother; Heart failure in his daughter and mother; Hypertension in his mother; and Other in his daughter. Allergies  Allergen Reactions  . Lyrica (Pregabalin) Hives      Review of Systems see hpi     Objective:   Physical Exam  Nursing note and vitals reviewed. Constitutional: He appears  well-developed and well-nourished. No distress.  HENT:  Head: Normocephalic and atraumatic.  Eyes: Conjunctivae and EOM are normal. Pupils are equal, round, and reactive to light. No scleral icterus.  Neck: Neck supple.  Neurological: He is alert. No cranial nerve deficit.       rhomberg equivocal  Skin: He is not diaphoretic.  Psychiatric: He has a normal mood and affect.          Assessment & Plan:

## 2011-06-02 ENCOUNTER — Encounter (HOSPITAL_COMMUNITY): Payer: Self-pay | Admitting: Pharmacy Technician

## 2011-06-06 ENCOUNTER — Encounter (HOSPITAL_COMMUNITY)
Admission: RE | Admit: 2011-06-06 | Discharge: 2011-06-06 | Disposition: A | Discharge status: Home / Self Care | Payer: Medicare Other | Source: Ambulatory Visit | Attending: Surgery | Admitting: Surgery

## 2011-06-06 ENCOUNTER — Encounter (HOSPITAL_COMMUNITY): Payer: Self-pay

## 2011-06-06 ENCOUNTER — Ambulatory Visit (HOSPITAL_COMMUNITY)
Admission: RE | Admit: 2011-06-06 | Discharge: 2011-06-06 | Disposition: A | Discharge status: Home / Self Care | Payer: Medicare Other | Source: Ambulatory Visit | Attending: Surgery | Admitting: Surgery

## 2011-06-06 DIAGNOSIS — Z01812 Encounter for preprocedural laboratory examination: Secondary | ICD-10-CM | POA: Insufficient documentation

## 2011-06-06 DIAGNOSIS — Z01818 Encounter for other preprocedural examination: Secondary | ICD-10-CM | POA: Insufficient documentation

## 2011-06-06 DIAGNOSIS — K432 Incisional hernia without obstruction or gangrene: Secondary | ICD-10-CM | POA: Insufficient documentation

## 2011-06-06 DIAGNOSIS — I700 Atherosclerosis of aorta: Secondary | ICD-10-CM | POA: Insufficient documentation

## 2011-06-06 DIAGNOSIS — R059 Cough, unspecified: Secondary | ICD-10-CM | POA: Insufficient documentation

## 2011-06-06 DIAGNOSIS — R05 Cough: Secondary | ICD-10-CM | POA: Insufficient documentation

## 2011-06-06 HISTORY — DX: Acute upper respiratory infection, unspecified: J06.9

## 2011-06-06 HISTORY — DX: Sleep apnea, unspecified: G47.30

## 2011-06-06 LAB — BASIC METABOLIC PANEL
GFR calc Af Amer: 73 mL/min — ABNORMAL LOW (ref >90)
GFR calc non Af Amer: 63 mL/min — ABNORMAL LOW (ref >90)
Glucose, Bld: 97 mg/dL (ref 70–99)
Potassium: 4.1 mEq/L (ref 3.5–5.1)
Sodium: 143 mEq/L (ref 135–145)

## 2011-06-06 LAB — CBC
Hemoglobin: 14.9 g/dL (ref 13.0–17.0)
MCHC: 32.9 g/dL (ref 30.0–36.0)
RDW: 14.2 % (ref 11.5–15.5)

## 2011-06-06 NOTE — Patient Instructions (Signed)
22 Steve [name]  06/06/2011   Your procedure is scheduled on:  06/12/11 Monday surgery [id number]  Report to [facility] at 0915      AM.  Call this number if you have problems the morning of surgery: [phone]     Or PST   [id number]  [place]              No antiinflammatories 7 days before surgery  Remember:    Do not eat food  Or DRINK ANY FLUIDS AFTER MIDNIGHT Sunday NIGHT     Clear liquids include soda, tea, black coffee, apple or grape juice, broth.  Take these medicines the morning of surgery with A SIP OF WATER:SPIRIVIA       ALBUTEROL IF NEEDED               BRING INHALERS AND BIPAP MASK WITH YOU TO HOSPITAL  Do not wear jewelry, make-up or nail polish.  Do not wear lotions, powders, or perfumes. You may wear deodorant.  Do not shave 48 hours prior to surgery.  Do not bring valuables to the hospital.  Contacts, dentures or bridgework may not be worn into surgery.  Leave suitcase in the car. After surgery it may be brought to your room.  For patients admitted to the hospital, checkout time is 11:00 AM the day of discharge.   Patients discharged the day of surgery will not be allowed to drive home.  Name and phone number of your driver:   Ann                                                                   Special Instructions: CHG Shower Use Special Wash: 1/2 bottle night before surgery and 1/2 bottle morning of surgery. REGULAR SOAP FACE AND PRIVATES                         MEN-MAY SHAVE FACE MORNING OF SURGERY  Please read over the following fact sheets that you were given: MRSA Information

## 2011-06-06 NOTE — Anesthesia Preprocedure Evaluation (Addendum)
Anesthesia Evaluation  Patient identified by MRN, date of birth, ID band Patient awake    Reviewed: Allergy & Precautions, H&P , NPO status , Patient's Chart, lab work & pertinent test results  Airway Mallampati: II TM Distance: >3 FB Neck ROM: Full    Dental No notable dental hx.    Pulmonary neg pulmonary ROS, COPDCurrent Smoker,  breath sounds clear to auscultation  Pulmonary exam normal       Cardiovascular hypertension, + Peripheral Vascular Disease negative cardio ROS  + dysrhythmias Atrial Fibrillation Rhythm:Regular Rate:Normal     Neuro/Psych negative neurological ROS  negative psych ROS   GI/Hepatic negative GI ROS, Neg liver ROS, GERD-  ,  Endo/Other  negative endocrine ROS  Renal/GU negative Renal ROS  negative genitourinary   Musculoskeletal negative musculoskeletal ROS (+)   Abdominal   Peds negative pediatric ROS (+)  Hematology negative hematology ROS (+)   Anesthesia Other Findings   Reproductive/Obstetrics negative OB ROS                          Anesthesia Physical  Anesthesia Plan  ASA: III  Anesthesia Plan: General   Post-op Pain Management:    Induction: Intravenous  Airway Management Planned: Oral ETT  Additional Equipment:   Intra-op Plan:   Post-operative Plan: Possible Post-op intubation/ventilation  Informed Consent: I have reviewed the patients History and Physical, chart, labs and discussed the procedure including the risks, benefits and alternatives for the proposed anesthesia with the patient or authorized representative who has indicated his/her understanding and acceptance.   Dental advisory given  Plan Discussed with: CRNA  Anesthesia Plan Comments: (Risk of post op ventialtion explained.  Pre-op breathing trweatment)      Anesthesia Quick Evaluation

## 2011-06-06 NOTE — Pre-Procedure Instructions (Signed)
States cough with white phlegm is new for him- instructed to call pulmonologist for visit pre op.  Seen per Dr Acey Lav pre op who also stated he should contact Dr Solon Augusta

## 2011-06-07 ENCOUNTER — Ambulatory Visit (INDEPENDENT_AMBULATORY_CARE_PROVIDER_SITE_OTHER): Payer: Medicare Other | Admitting: Adult Health

## 2011-06-07 ENCOUNTER — Encounter: Payer: Self-pay | Admitting: Adult Health

## 2011-06-07 ENCOUNTER — Encounter (HOSPITAL_COMMUNITY): Payer: Self-pay

## 2011-06-07 VITALS — BP 132/74 | HR 53 | Temp 96.7°F | Ht 69.0 in | Wt 206.4 lb

## 2011-06-07 DIAGNOSIS — J449 Chronic obstructive pulmonary disease, unspecified: Secondary | ICD-10-CM

## 2011-06-07 LAB — SURGICAL PCR SCREEN
MRSA, PCR: NEGATIVE
Staphylococcus aureus: POSITIVE — AB

## 2011-06-07 MED ORDER — LEVALBUTEROL HCL 0.63 MG/3ML IN NEBU
0.6300 mg | INHALATION_SOLUTION | Freq: Once | RESPIRATORY_TRACT | Status: AC
  Administered 2011-06-07: 0.63 mg via RESPIRATORY_TRACT

## 2011-06-07 MED ORDER — CEFDINIR 300 MG PO CAPS: 300.0000 mg | ORAL_CAPSULE | Freq: Two times a day (BID) | ORAL | Status: AC

## 2011-06-07 NOTE — Progress Notes (Signed)
Subjective:    Patient ID: Steve [name], male    DOB: [birth year], 78 y.o.   MRN: [id number]  HPI 78 year-old male with a known history of COPD (former smoker) and  AAA w/ planned aneurysm repair on 11/14/2010. Patient had postop complications with respiratory acidosis and hypoxemia requiring intubation.  Critical care team was consulted  11/28/2010 Post hospital  Pt returns for post hospital followup. Patient was hospitalized November through November 12 for planned abdominal aortic aneurysm repair. Patient had postop complications with respiratory acidosis and hypoxemia requiring intubation and vent support. Critical care team with initial consult. Patient had known underlying COPD and was on home O2 followed by his primary care doctor. Patient was treated with aggressive pulmonary hygiene along with IV antibiotics and diuresis and was successfully extubated on November 8. Patient was started on Spiriva. Echocardiogram during admission showed normal left ventricular function. Patient did have atrial fibrillation during hospitalization. He was started on metoprolol and converted over to normal sinus rhythm prior to discharge. This was felt likely to stress response and hypokalemia. Patient's Norvasc was held prior to discharge. Patient does complain that he believes he has sleep apnea and wants to be tested for this. Patient's wife says that he snores  and stops breathing intermittently. He complains of daytime hypersomnolence  -tired all the time.   Since discharge. Patient says he is slowly improving. He denies any cough, chest pain, wheezing, or increased leg swelling. Continues to feel extremely weak. It wears out easily with shortness of breath with activity.  ROV 12/29/10 --77yo  (> 120 pk-yrs),  follows up after acute episode hypercapneic resp failure while hospitalized post-AAA repair 11/12. He established care here 11/12, was seen by [place] in house. Had PFT today = normal AF to MILD AFL, no BD  response, decreased RV, decreased DLCO. He also had PSG 12/14. He is seeing Dr Hillis Range for TURP in January. He is unfortunately smoking again - 1 pk/day. He was started on Spiriva at time of discharge.    PULMONARY FUNCTON TEST 12/29/2010  FVC 3.59  FEV1 2.55  FEV1/FVC 71  FVC  % Predicted 91  FEV % Predicted 98  FeF 25-75 1.79  FeF 25-75 % Predicted 2.33   ROV 04/21/11 -- Follows up for dyspnea, tobacco use, OSA/OHS. We ordered BiPAP 15/9 since last visit, but he is unable to wear because the pressure is too high. He is smoking 2pk/day - pft showed mild AFL last time. He is on Spiriva, not sure it is helping him. Uses SABA rarely.    06/07/2011 Acute OV   Complains of  increased SOB, prod cough with white mucus, wheezing x2 days - denies f/c/s, tightness.  will have hernia repair on 6.3.13 OTC not working  Wants to be better before surgery.  No fever or hemotpysis    ROS:  Constitutional:   No  weight loss, night sweats,  Fevers, chills,  +fatigue, or  lassitude.  HEENT:   No headaches,  Difficulty swallowing,  Tooth/dental problems, or  Sore throat,                No sneezing, itching, ear ache, + nasal congestion, post nasal drip,   CV:  No chest pain,  Orthopnea, PND, swelling in lower extremities, anasarca, dizziness, palpitations, syncope.   GI  No heartburn, indigestion,   vomiting, diarrhea, change in bowel habits, loss of appetite, bloody stools.   Resp:   No coughing up of blood. Marland Kitchen  No  chest wall deformity  Skin: no rash or lesions.  GU: no dysuria, change in color of urine, no urgency or frequency.  No flank pain, no hematuria   MS:  No joint pain or swelling.  No decreased range of motion.    Psych:  No change in mood or affect. No depression or anxiety.  No memory loss.      Objective:   Physical Exam GEN: A/Ox3; pleasant , NAD, elderly   HEENT:  [place]/AT,  EACs-clear, TMs-wnl, NOSE-clear, THROAT-clear, no lesions, no postnasal drip or exudate noted.    NECK:  Supple w/ fair ROM; no JVD; normal carotid impulses w/o bruits; no thyromegaly or nodules palpated; no lymphadenopathy.  RESP  Coarse BS w/o, wheezes/ rales/ or rhonchi.no accessory muscle use, no dullness to percussion  CARD:  RRR, no m/r/g  , tr peripheral edema, pulses intact, no cyanosis or clubbing.  Musco: Warm bil, no deformities or joint swelling noted.   Neuro: alert, no focal deficits noted.    Skin: Warm, no lesions or rashes       Assessment & Plan:

## 2011-06-07 NOTE — Pre-Procedure Instructions (Signed)
06/07/11- note in EPIC where patient was seen by NP  T Parrott (pulmonology) this an and began on antibiotics

## 2011-06-07 NOTE — Patient Instructions (Signed)
Omnicef 300mg  Twice daily  For 5 days  Mucinex DM Twice daily  As needed  Cough/congestion  Fluids and rest .  No smoking from now until surgery - this would be a good time to quit all together.  Please contact office for sooner follow up if symptoms do not improve or worsen or seek emergency care  follow up Dr. Delton Coombes  As planned and As needed   Take your CPAP mask with you to hospital.

## 2011-06-09 NOTE — Assessment & Plan Note (Signed)
Flare   Plan:  Omnicef 300mg  Twice daily  For 5 days  Mucinex DM Twice daily  As needed  Cough/congestion  Fluids and rest .  No smoking from now until surgery - this would be a good time to quit all together.  Please contact office for sooner follow up if symptoms do not improve or worsen or seek emergency care  follow up Dr. Delton Coombes  As planned and As needed   Take your CPAP mask with you to hospital.

## 2011-06-12 ENCOUNTER — Inpatient Hospital Stay (HOSPITAL_COMMUNITY)
Admission: RE | Admit: 2011-06-12 | Discharge: 2011-06-16 | DRG: 353 | Disposition: A | Discharge status: Home / Self Care | Payer: Medicare Other | Source: Ambulatory Visit | Attending: Surgery | Admitting: Surgery

## 2011-06-12 ENCOUNTER — Encounter (HOSPITAL_COMMUNITY): Payer: Self-pay | Admitting: Anesthesiology

## 2011-06-12 ENCOUNTER — Encounter (HOSPITAL_COMMUNITY)
Admission: RE | Disposition: A | Discharge status: Home / Self Care | Payer: Self-pay | Source: Ambulatory Visit | Attending: Surgery

## 2011-06-12 ENCOUNTER — Encounter (HOSPITAL_COMMUNITY): Payer: Self-pay | Admitting: *Deleted

## 2011-06-12 ENCOUNTER — Ambulatory Visit (HOSPITAL_COMMUNITY): Payer: Medicare Other | Admitting: Anesthesiology

## 2011-06-12 DIAGNOSIS — K432 Incisional hernia without obstruction or gangrene: Principal | ICD-10-CM | POA: Diagnosis present

## 2011-06-12 DIAGNOSIS — G4733 Obstructive sleep apnea (adult) (pediatric): Secondary | ICD-10-CM | POA: Diagnosis present

## 2011-06-12 DIAGNOSIS — F17200 Nicotine dependence, unspecified, uncomplicated: Secondary | ICD-10-CM | POA: Diagnosis present

## 2011-06-12 DIAGNOSIS — J4489 Other specified chronic obstructive pulmonary disease: Secondary | ICD-10-CM | POA: Diagnosis present

## 2011-06-12 DIAGNOSIS — R05 Cough: Secondary | ICD-10-CM

## 2011-06-12 DIAGNOSIS — J449 Chronic obstructive pulmonary disease, unspecified: Secondary | ICD-10-CM | POA: Diagnosis present

## 2011-06-12 DIAGNOSIS — K439 Ventral hernia without obstruction or gangrene: Secondary | ICD-10-CM | POA: Insufficient documentation

## 2011-06-12 DIAGNOSIS — K219 Gastro-esophageal reflux disease without esophagitis: Secondary | ICD-10-CM | POA: Diagnosis present

## 2011-06-12 DIAGNOSIS — N400 Enlarged prostate without lower urinary tract symptoms: Secondary | ICD-10-CM | POA: Diagnosis present

## 2011-06-12 DIAGNOSIS — M5416 Radiculopathy, lumbar region: Secondary | ICD-10-CM | POA: Insufficient documentation

## 2011-06-12 DIAGNOSIS — I714 Abdominal aortic aneurysm, without rupture, unspecified: Secondary | ICD-10-CM | POA: Insufficient documentation

## 2011-06-12 DIAGNOSIS — G4700 Insomnia, unspecified: Secondary | ICD-10-CM | POA: Insufficient documentation

## 2011-06-12 DIAGNOSIS — R001 Bradycardia, unspecified: Secondary | ICD-10-CM | POA: Insufficient documentation

## 2011-06-12 DIAGNOSIS — J9620 Acute and chronic respiratory failure, unspecified whether with hypoxia or hypercapnia: Secondary | ICD-10-CM | POA: Diagnosis not present

## 2011-06-12 DIAGNOSIS — F329 Major depressive disorder, single episode, unspecified: Secondary | ICD-10-CM | POA: Diagnosis present

## 2011-06-12 DIAGNOSIS — I719 Aortic aneurysm of unspecified site, without rupture: Secondary | ICD-10-CM

## 2011-06-12 DIAGNOSIS — Z8719 Personal history of other diseases of the digestive system: Secondary | ICD-10-CM

## 2011-06-12 DIAGNOSIS — I4891 Unspecified atrial fibrillation: Secondary | ICD-10-CM | POA: Diagnosis present

## 2011-06-12 DIAGNOSIS — Z9889 Other specified postprocedural states: Secondary | ICD-10-CM

## 2011-06-12 DIAGNOSIS — G4710 Hypersomnia, unspecified: Secondary | ICD-10-CM | POA: Insufficient documentation

## 2011-06-12 DIAGNOSIS — G4730 Sleep apnea, unspecified: Secondary | ICD-10-CM

## 2011-06-12 DIAGNOSIS — I10 Essential (primary) hypertension: Secondary | ICD-10-CM | POA: Diagnosis present

## 2011-06-12 DIAGNOSIS — R0902 Hypoxemia: Secondary | ICD-10-CM | POA: Diagnosis not present

## 2011-06-12 HISTORY — PX: INCISIONAL HERNIA REPAIR: SHX193

## 2011-06-12 SURGERY — REPAIR, HERNIA, INCISIONAL
Anesthesia: General | Site: Abdomen | Wound class: Clean

## 2011-06-12 MED ORDER — ALBUTEROL SULFATE HFA 108 (90 BASE) MCG/ACT IN AERS: 2.0000 | INHALATION_SPRAY | RESPIRATORY_TRACT | Status: DC | PRN

## 2011-06-12 MED ORDER — NEOSTIGMINE METHYLSULFATE 1 MG/ML IJ SOLN
INTRAMUSCULAR | Status: DC | PRN
  Administered 2011-06-12: 4 mg via INTRAVENOUS

## 2011-06-12 MED ORDER — ALBUTEROL SULFATE (5 MG/ML) 0.5% IN NEBU
2.5000 mg | INHALATION_SOLUTION | Freq: Four times a day (QID) | RESPIRATORY_TRACT | Status: DC
  Administered 2011-06-12 – 2011-06-16 (×14): 2.5 mg via RESPIRATORY_TRACT
  Filled 2011-06-12 (×17): qty 0.5

## 2011-06-12 MED ORDER — FENTANYL CITRATE 0.05 MG/ML IJ SOLN
INTRAMUSCULAR | Status: DC | PRN
  Administered 2011-06-12 (×3): 50 ug via INTRAVENOUS
  Administered 2011-06-12: 100 ug via INTRAVENOUS

## 2011-06-12 MED ORDER — FENTANYL CITRATE 0.05 MG/ML IJ SOLN
INTRAMUSCULAR | Status: AC
  Filled 2011-06-12: qty 2

## 2011-06-12 MED ORDER — KCL IN DEXTROSE-NACL 20-5-0.45 MEQ/L-%-% IV SOLN
INTRAVENOUS | Status: DC
  Administered 2011-06-12 – 2011-06-13 (×3): via INTRAVENOUS
  Filled 2011-06-12 (×9): qty 1000

## 2011-06-12 MED ORDER — IPRATROPIUM BROMIDE 0.02 % IN SOLN
0.5000 mg | Freq: Four times a day (QID) | RESPIRATORY_TRACT | Status: DC
  Administered 2011-06-12 – 2011-06-16 (×14): 0.5 mg via RESPIRATORY_TRACT
  Filled 2011-06-12 (×24): qty 2.5

## 2011-06-12 MED ORDER — EPHEDRINE SULFATE 50 MG/ML IJ SOLN
INTRAMUSCULAR | Status: DC | PRN
  Administered 2011-06-12: 10 mg via INTRAVENOUS
  Administered 2011-06-12: 5 mg via INTRAVENOUS

## 2011-06-12 MED ORDER — CEFAZOLIN SODIUM-DEXTROSE 2-3 GM-% IV SOLR
2.0000 g | INTRAVENOUS | Status: AC
  Administered 2011-06-12: 2 g via INTRAVENOUS

## 2011-06-12 MED ORDER — ALBUTEROL SULFATE (5 MG/ML) 0.5% IN NEBU
INHALATION_SOLUTION | RESPIRATORY_TRACT | Status: AC
  Filled 2011-06-12: qty 0.5

## 2011-06-12 MED ORDER — ONDANSETRON HCL 4 MG/2ML IJ SOLN
INTRAMUSCULAR | Status: DC | PRN
  Administered 2011-06-12: 4 mg via INTRAVENOUS

## 2011-06-12 MED ORDER — FENTANYL CITRATE 0.05 MG/ML IJ SOLN
25.0000 ug | INTRAMUSCULAR | Status: DC | PRN
  Administered 2011-06-12: 25 ug via INTRAVENOUS

## 2011-06-12 MED ORDER — GLYCOPYRROLATE 0.2 MG/ML IJ SOLN
INTRAMUSCULAR | Status: DC | PRN
  Administered 2011-06-12: 0.6 mg via INTRAVENOUS

## 2011-06-12 MED ORDER — ALBUTEROL SULFATE (5 MG/ML) 0.5% IN NEBU
2.5000 mg | INHALATION_SOLUTION | RESPIRATORY_TRACT | Status: DC | PRN
  Administered 2011-06-12 (×2): 2.5 mg via RESPIRATORY_TRACT

## 2011-06-12 MED ORDER — LACTATED RINGERS IV SOLN: INTRAVENOUS | Status: DC

## 2011-06-12 MED ORDER — PROMETHAZINE HCL 25 MG/ML IJ SOLN: 6.2500 mg | INTRAMUSCULAR | Status: DC | PRN

## 2011-06-12 MED ORDER — FENTANYL CITRATE 0.05 MG/ML IJ SOLN
25.0000 ug | INTRAMUSCULAR | Status: DC | PRN
  Administered 2011-06-12: 50 ug via INTRAVENOUS
  Administered 2011-06-12 (×2): 25 ug via INTRAVENOUS

## 2011-06-12 MED ORDER — FENTANYL CITRATE 0.05 MG/ML IJ SOLN
50.0000 ug | INTRAMUSCULAR | Status: DC | PRN
  Administered 2011-06-12 (×2): 100 ug via INTRAVENOUS
  Administered 2011-06-12 (×2): 50 ug via INTRAVENOUS
  Administered 2011-06-12 – 2011-06-13 (×8): 100 ug via INTRAVENOUS
  Filled 2011-06-12 (×12): qty 2

## 2011-06-12 MED ORDER — ROCURONIUM BROMIDE 100 MG/10ML IV SOLN
INTRAVENOUS | Status: DC | PRN
  Administered 2011-06-12: 20 mg via INTRAVENOUS
  Administered 2011-06-12: 10 mg via INTRAVENOUS
  Administered 2011-06-12: 40 mg via INTRAVENOUS

## 2011-06-12 MED ORDER — ACETAMINOPHEN 10 MG/ML IV SOLN
INTRAVENOUS | Status: DC | PRN
  Administered 2011-06-12: 1000 mg via INTRAVENOUS

## 2011-06-12 MED ORDER — PROMETHAZINE HCL 25 MG/ML IJ SOLN: 12.5000 mg | INTRAMUSCULAR | Status: DC | PRN

## 2011-06-12 MED ORDER — ACETAMINOPHEN 10 MG/ML IV SOLN
INTRAVENOUS | Status: AC
  Filled 2011-06-12: qty 100

## 2011-06-12 MED ORDER — ALBUTEROL SULFATE (5 MG/ML) 0.5% IN NEBU
INHALATION_SOLUTION | RESPIRATORY_TRACT | Status: AC
  Administered 2011-06-12: 2.5 mg
  Filled 2011-06-12: qty 0.5

## 2011-06-12 MED ORDER — HYDROCODONE-ACETAMINOPHEN 5-325 MG PO TABS: 1.0000 | ORAL_TABLET | ORAL | Status: DC | PRN

## 2011-06-12 MED ORDER — CEFAZOLIN SODIUM-DEXTROSE 2-3 GM-% IV SOLR
INTRAVENOUS | Status: AC
  Filled 2011-06-12: qty 50

## 2011-06-12 MED ORDER — PROPOFOL 10 MG/ML IV BOLUS
INTRAVENOUS | Status: DC | PRN
  Administered 2011-06-12: 150 mg via INTRAVENOUS
  Administered 2011-06-12: 50 mg via INTRAVENOUS

## 2011-06-12 MED ORDER — TIOTROPIUM BROMIDE MONOHYDRATE 18 MCG IN CAPS
18.0000 ug | ORAL_CAPSULE | Freq: Every day | RESPIRATORY_TRACT | Status: DC
  Filled 2011-06-12: qty 5

## 2011-06-12 MED ORDER — ACETAMINOPHEN 325 MG PO TABS: 650.0000 mg | ORAL_TABLET | ORAL | Status: DC | PRN

## 2011-06-12 MED ORDER — BUPIVACAINE HCL (PF) 0.5 % IJ SOLN
INTRAMUSCULAR | Status: AC
  Filled 2011-06-12: qty 30

## 2011-06-12 MED ORDER — LACTATED RINGERS IV SOLN
INTRAVENOUS | Status: DC
  Administered 2011-06-12: 1000 mL via INTRAVENOUS
  Administered 2011-06-12: 13:00:00 via INTRAVENOUS

## 2011-06-12 MED ORDER — MEPERIDINE HCL 50 MG/ML IJ SOLN: 6.2500 mg | INTRAMUSCULAR | Status: DC | PRN

## 2011-06-12 MED ORDER — HYDROMORPHONE HCL PF 1 MG/ML IJ SOLN: 1.0000 mg | INTRAMUSCULAR | Status: DC | PRN

## 2011-06-12 SURGICAL SUPPLY — 28 items
BINDER ABD UNIV 12 45-62 (WOUND CARE) IMPLANT
BINDER ABDOMINAL 46IN 62IN (WOUND CARE) ×2
BLADE HEX COATED 2.75 (ELECTRODE) ×2 IMPLANT
CANISTER SUCTION 2500CC (MISCELLANEOUS) ×2 IMPLANT
CLOTH BEACON ORANGE TIMEOUT ST (SAFETY) ×2 IMPLANT
DRAIN CHANNEL 19F RND (DRAIN) ×2 IMPLANT
DRAPE LAPAROSCOPIC ABDOMINAL (DRAPES) ×2 IMPLANT
ELECT REM PT RETURN 9FT ADLT (ELECTROSURGICAL) ×2
ELECTRODE REM PT RTRN 9FT ADLT (ELECTROSURGICAL) ×1 IMPLANT
EVACUATOR 1/8 PVC DRAIN (DRAIN) ×2 IMPLANT
GLOVE BIOGEL PI IND STRL 7.0 (GLOVE) ×1 IMPLANT
GLOVE BIOGEL PI INDICATOR 7.0 (GLOVE) ×1
GLOVE SURG ORTHO 8.0 STRL STRW (GLOVE) ×2 IMPLANT
GOWN STRL NON-REIN LRG LVL3 (GOWN DISPOSABLE) ×3 IMPLANT
GOWN STRL REIN XL XLG (GOWN DISPOSABLE) ×4 IMPLANT
KIT BASIN OR (CUSTOM PROCEDURE TRAY) ×2 IMPLANT
MESH SOFT 12X12IN BARD (Mesh General) ×1 IMPLANT
NS IRRIG 1000ML POUR BTL (IV SOLUTION) ×2 IMPLANT
PACK GENERAL/GYN (CUSTOM PROCEDURE TRAY) ×2 IMPLANT
SPONGE GAUZE 4X4 12PLY (GAUZE/BANDAGES/DRESSINGS) ×2 IMPLANT
STAPLER VISISTAT 35W (STAPLE) ×2 IMPLANT
SUT ETHILON 3 0 PS 1 (SUTURE) ×2 IMPLANT
SUT NOVA 1 T20/GS 25DT (SUTURE) ×6 IMPLANT
SUT NOVA NAB GS-21 0 18 T12 DT (SUTURE) ×3 IMPLANT
SUT PDS AB 1 CTX 36 (SUTURE) ×4 IMPLANT
SUT VIC AB 2-0 CT2 27 (SUTURE) ×2 IMPLANT
TOWEL OR 17X26 10 PK STRL BLUE (TOWEL DISPOSABLE) ×2 IMPLANT
TRAY FOLEY CATH 14FRSI W/METER (CATHETERS) ×1 IMPLANT

## 2011-06-12 NOTE — Brief Op Note (Signed)
06/12/2011  1:13 PM  PATIENT:  Steve [name]  78 y.o. male  PRE-OPERATIVE DIAGNOSIS:  ventral incisional hernia  POST-OPERATIVE DIAGNOSIS:  ventral incisional hernia  PROCEDURE:  Procedure(s) (LRB): HERNIA REPAIR INCISIONAL (N/A) INSERTION OF MESH (N/A)  SURGEON:  Surgeon(s) and Role:    * Velora Heckler, MD - Primary  ANESTHESIA:   general  EBL:  Total I/O In: 1000 [I.V.:1000] Out: -   BLOOD ADMINISTERED:none  DRAINS: (2) Blake drain(s) in the subcutaneous spaces   LOCAL MEDICATIONS USED:  NONE  SPECIMEN:  No Specimen  DISPOSITION OF SPECIMEN:  N/A  COUNTS:  YES  TOURNIQUET:  * No tourniquets in log *  DICTATION: .Other Dictation: Dictation Number [id number]  PLAN OF CARE: Admit to inpatient   PATIENT DISPOSITION:  PACU - hemodynamically stable.   Delay start of Pharmacological VTE agent (>24hrs) due to surgical blood loss or risk of bleeding: yes  Velora Heckler, MD, [facility] Office: [phone]

## 2011-06-12 NOTE — Transfer of Care (Signed)
Immediate Anesthesia Transfer of Care Note  Patient: Steve [name]  Procedure(s) Performed: Procedure(s) (LRB): HERNIA REPAIR INCISIONAL (N/A) INSERTION OF MESH (N/A)  Patient Location: PACU  Anesthesia Type: General  Level of Consciousness: awake, alert , oriented, patient cooperative and responds to stimulation  Airway & Oxygen Therapy: Patient Spontanous Breathing and Patient connected to face mask oxygen  Post-op Assessment: Report given to PACU RN, Post -op Vital signs reviewed and stable and Patient moving all extremities X 4  Post vital signs: Reviewed and stable  Complications: No apparent anesthesia complications

## 2011-06-12 NOTE — Progress Notes (Signed)
eLink Physician-Brief Progress Note Patient Name: Steve [name] DOB: 24-Jun-1933 MRN: [id number]  Date of Service  06/12/2011   HPI/Events of Note   Pt  With abd pain post hernia repair.  Now on Bilevel  eICU Interventions  Increase fentanyl dosing now that pt on bilevel   Intervention Category Intermediate Interventions: Pain - evaluation and management  Shan Levans 06/12/2011, 3:35 PM

## 2011-06-12 NOTE — H&P (View-Only) (Signed)
Chief Complaint  Patient presents with  . New Evaluation    Ventral Incisional Hernia - referral from Dr. Lindsee Labarre Early    HISTORY: Patient is a 78-year-old white male referred by his vascular surgeon for evaluation of ventral incisional hernia. Patient underwent repair of abdominal aortic aneurysm in November 2012. Approximately 3 months ago the patient developed a bulge in the epigastrium. This has continued to increase in size. He has had minor pain. He has had no signs or symptoms of intestinal obstruction but he does require laxative use approximately once a week. His only prior abdominal surgery with prostatectomy.  Following aneurysm repair, the patient was on a ventilator due to his underlying COPD. He is followed by his pulmonologist.  Patient presents today for evaluation for repair of an enlarging ventral incisional hernia.  Past Medical History  Diagnosis Date  . Hypertension   . Prostate enlargement   . History of chicken pox     childhood  . GERD (gastroesophageal reflux disease)   . Arthritis     knee and back  . AAA (abdominal aortic aneurysm)     requested stress test from [facility]  . Anxiety   . Dysrhythmia     hx of atrial fib post op x 2 days   . COPD (chronic obstructive pulmonary disease)   . Shortness of breath     with exertion      Current Outpatient Prescriptions  Medication Sig Dispense Refill  . albuterol (VENTOLIN HFA) 108 (90 BASE) MCG/ACT inhaler Inhale 2 puffs into the lungs every 4 (four) hours as needed. For shortness of breath      . ALPRAZolam (XANAX) 0.25 MG tablet Take 1 tablet (0.25 mg total) by mouth 3 (three) times daily as needed. For anxiety  90 tablet  3  . aspirin EC 81 MG tablet Take 1 tablet (81 mg total) by mouth daily.  1 tablet  0  . citalopram (CELEXA) 20 MG tablet Take 1 tablet (20 mg total) by mouth daily after breakfast.  30 tablet  11  . loratadine (CLARITIN) 10 MG tablet Take 10 mg by mouth daily.       . tiotropium (SPIRIVA  HANDIHALER) 18 MCG inhalation capsule Place 1 capsule (18 mcg total) into inhaler and inhale daily.  30 capsule  3  . DISCONTD: citalopram (CELEXA) 20 MG tablet Take 1 tablet (20 mg total) by mouth daily.  30 tablet  6     Allergies  Allergen Reactions  . Lyrica (Pregabalin) Hives     Family History  Problem Relation Age of Onset  . Arthritis Mother   . Heart failure Mother   . Hypertension Mother   . Diabetes Mother   . Heart failure Daughter     deceased age 50  . Other Daughter     deceased age 22 MVA     History   Social History  . Marital Status: Married    Spouse Name: N/A    Number of Children: N/A  . Years of Education: N/A   Social History Main Topics  . Smoking status: Current Everyday Smoker -- 2.0 packs/day for 55 years    Types: Cigarettes  . Smokeless tobacco: Never Used  . Alcohol Use: No     no etoh in 28 years   . Drug Use: No  . Sexually Active: None   Other Topics Concern  . None   Social History Narrative  . None     REVIEW OF SYSTEMS -   PERTINENT POSITIVES ONLY: Mild intermittent pain along the costal margins, intermittent laxative use  EXAM: Filed Vitals:   05/23/11 0835  BP: 122/84  Pulse: 62  Temp: 97.8 F (36.6 C)  Resp: 18    HEENT: normocephalic; pupils equal and reactive; sclerae clear; dentition good; mucous membranes moist NECK:  symmetric on extension; no palpable anterior or posterior cervical lymphadenopathy; no supraclavicular masses; no tenderness CHEST: clear to auscultation bilaterally without rales, rhonchi, or wheezes CARDIAC: regular rate and rhythm without significant murmur; peripheral pulses are full ABDOMEN: soft without distension; bowel sounds present; no mass; no hepatosplenomegaly; Large ventral incisional hernia in the epigastrium extending from the xiphoid process to nearly the umbilicus. Rectus muscles were separated approximately 8 cm. Hernia is spontaneously reducible. EXT:  non-tender without  edema; no deformity NEURO: no gross focal deficits; no sign of tremor   LABORATORY RESULTS: See [facility] (CHL-Epic) for most recent results   RADIOLOGY RESULTS: See [facility] (CHL-Epic) for most recent results   IMPRESSION: #1 ventral incisional hernia, reducible, mildly symptomatic #2 status post abdominal aortic aneurysm repair #3 COPD requiring ventilator support postoperative  PLAN: I discussed the above findings with the patient and his wife today in the office at length.  Due to the location and size of his defect, I have recommended open repair instead of laparoscopic repair. We discussed the procedure at length and the hospital stay to be anticipated. We discussed the need for postoperative drains. We discussed the potential for recurrent hernia being at least 10%. We discussed potential pulmonary complications given his underlying COPD and his previous need for mechanical ventilation following surgery.  Patient and his wife understand and wish to proceed. We will schedule surgery in the near future at a time convenient for the patient.  The risks and benefits of the procedure have been discussed at length with the patient.  The patient understands the proposed procedure, potential alternative treatments, and the course of recovery to be expected.  All of the patient's questions have been answered at this time.  The patient wishes to proceed with surgery.  Maurica Omura M. Jolanda Mccann, MD, FACS General & Endocrine Surgery [facility]   Visit Diagnoses: 1. Incisional hernia     Primary Care Physician: Hodgin Jr, Thomas Whitson, MD, MD  Vascular Surgeon:  Dr. Madailein Londo Early  Pulmonologist:  Dr. Rob Byrum  

## 2011-06-12 NOTE — Interval H&P Note (Signed)
History and Physical Interval Note:  06/12/2011 11:10 AM  Steve [name]  has presented today for surgery, with the diagnosis of ventral incisional hernia.  The various methods of treatment have been discussed with the patient and family. After consideration of risks, benefits and other options for treatment, the patient has consented to    Procedure(s) (LRB): HERNIA REPAIR INCISIONAL (N/A) INSERTION OF MESH (N/A) as a surgical intervention .    The patients' history has been reviewed, patient examined, no change in status, stable for surgery.  I have reviewed the patients' chart and labs.  Questions were answered to the patient's satisfaction.    Velora Heckler, MD, [facility] Office: [phone]    Falecia Vannatter Judie Petit

## 2011-06-12 NOTE — Consult Note (Signed)
Name: Steve [name] MRN: [id number] DOB: 24-Aug-1933    LOS: 0  Referring Provider:  [facility] Reason for Referral:  Known COPD, Hx of reintubation post surgery.  PULMONARY / CRITICAL CARE MEDICINE  HPI:  78 yo 2 ppd smoker , pulmonary pt of Dr. Delton [name] who underwent ventral hernia repair 6/3 and was felt to be tenous post op and was to be placed in SDU. PCCM asked to provide support in his plan of care.  Of note he has severe OSA bur refuses to wear Cpap.  Past Medical History  Diagnosis Date  . Prostate enlargement   . History of chicken pox     childhood  . GERD (gastroesophageal reflux disease)   . Arthritis     knee and back  . AAA (abdominal aortic aneurysm)     stress test 09/14/10 [facility]  . Anxiety   . Dysrhythmia     hx of atrial fib post op x 2 days   . COPD (chronic obstructive pulmonary disease)   . Shortness of breath     with exertion   . Recurrent upper respiratory infection (URI)     productive cough- white phlegm- no fever  . Sleep apnea      12/12 sleep study,SEVERE per study  Dr Steve [name]- states doesnt wear machine on regular basis- setting BiPAP 9/9  . Hypertension     chest x ray 12/12 EPIC- repeated 06/06/11, EKG 11/12 [facility]   Past Surgical History  Procedure Date  . Knee surgery 1957    left knee arthotomy  . Abdominal aortic aneurysm repair 11/14/2010    AAA Repair    Dr Steve [name]  . Transurethral resection of prostate 01/25/2011    TURP   Prior to Admission medications   Medication Sig Start Date End Date Taking? Authorizing Provider  albuterol (VENTOLIN HFA) 108 (90 BASE) MCG/ACT inhaler Inhale 2 puffs into the lungs every 4 (four) hours as needed. For shortness of breath   Yes Historical Provider, MD  ALPRAZolam (XANAX) 0.25 MG tablet Take 0.25 mg by mouth at bedtime as needed.   Yes Historical Provider, MD  cefdinir (OMNICEF) 300 MG capsule Take 1 capsule (300 mg total) by mouth 2 (two) times daily. 06/07/11 06/17/11 Yes Steve S Parrett, NP  tiotropium (SPIRIVA  HANDIHALER) 18 MCG inhalation capsule Place 1 capsule (18 mcg total) into inhaler and inhale daily. 11/22/10 11/22/11 Yes Steve Peer, MD   Allergies Allergies  Allergen Reactions  . Lyrica (Pregabalin) Hives    Family History Family History  Problem Relation Age of Onset  . Arthritis Mother   . Heart failure Mother   . Hypertension Mother   . Diabetes Mother   . Heart failure Daughter     deceased age 22  . Other Daughter     deceased age 82 MVA   Social History  reports that he has been smoking Cigarettes.  He has a 110 pack-year smoking history. He has never used smokeless tobacco. He reports that he does not drink alcohol or use illicit drugs.  Review Of Systems: na  Brief patient description:  78 yo wm post op ventral hernia repair, desats easily.  Events Since Admission: Post op ventral hernia repair.  Current Status: Sedated in pacu Vital Signs: Temp:  [97.3 F (36.3 C)] 97.3 F (36.3 C) (06/03 0924) Pulse Rate:  [52] 52  (06/03 0924) Resp:  [20] 20  (06/03 0924) BP: (170)/(74) 170/74 mmHg (06/03 0924) SpO2:  [95 %] 95 % (  06/03 0924)  Physical Examination: General:  [name] Neuro:  Sleepy post op but follows commands HEENT:  No jvd Neck:  No adenopathy Cardiovascular:  hsr Lungs:  Rhonchi bilateral Abdomen:  Dressing intact Musculoskeletal:  intact Skin:  warm  Patient Active Problem List  Diagnoses  . Lumbar radicular pain  . COPD (chronic obstructive pulmonary disease)  . Depression  . Aortic aneurysm  . BPH (benign prostatic hyperplasia)  . Insomnia  . Acute and chronic respiratory failure  . Hypertension  . Atrial fibrillation  . Hypersomnolence  . Decreased heart rate  . Dizziness  . Cough  . Abdominal wall hernia  . Abdominal aneurysm without mention of rupture  . Sleep apnea  . Incisional hernia  . Gait instability  . S/P repair of ventral hernia     ASSESSMENT AND PLAN  PULMONARY No results found for this basename:  PHART:5,PCO2:5,PCO2ART:5,PO2ART:5,HCO3:5,O2SAT:5 in the last 168 hours Ventilator Settings:   CXR:   No results found.  ETT:   A:  Hypoxia , multifactorial in 78 yo smoker, untreated OSA, COPD, abd surgery with administration of narcotics. P:   -PRM NIMS -minimal narcotics -is/flutter valve -bd's  CARDIOVASCULAR No results found for this basename: TROPONINI:5,LATICACIDVEN:5, O2SATVEN:5,PROBNP:5 in the last 168 hours ECG:   Lines:   A: CAF/AAA repair/HTN P:  -per ccs  RENAL  Lab 06/06/11 1445  NA 143  K 4.1  CL 104  CO2 28  BUN 18  CREATININE 1.11  CALCIUM 9.6  MG --  PHOS --   Intake/Output      06/02 0701 - 06/03 0700 06/03 0701 - 06/04 0700   I.V.  1300   Total Intake  1300   Blood  20   Total Output  20   Net  +1280         Foley:    A:  No acute renal issue P:     GASTROINTESTINAL No results found for this basename: AST:5,ALT:5,ALKPHOS:5,BILITOT:5,PROT:5,ALBUMIN:5 in the last 168 hours  A:  Post ventral herina repair P:   Per ccs  HEMATOLOGIC  Lab 06/06/11 1445  HGB 14.9  HCT 45.3  PLT 195  INR --  APTT --   A:  No acute issue P:    INFECTIOUS  Lab 06/06/11 1445  WBC 9.3  PROCALCITON --   Cultures: Per ccs Antibiotics:   A:  No acute issue P:     ENDOCRINE No results found for this basename: GLUCAP:5 in the last 168 hours A:  No acute issue   P:     NEUROLOGIC  A:  Hypersomnolence post op. P:   -prn bipap may need reintubation  BEST PRACTICE / DISPOSITION Level of Care:  sdu Primary Service:  ccs Consultants:  pccm Code Status:  full Diet:  npo DVT Px:  pas GI Px:  ppi Skin Integrity:  abd dressing intact Social / Family:  None available Steve [name] Minor ACNP Steve [name] PCCM Pager [phone] till 3 pm If no answer page [phone] 06/12/2011, 1:26 PM  Reviewed above, examined pt, and agree with assessment/plan.    He has hx of COPD on home oxygen and severe OSA.  He is s/p ventral hernia repair and has post-op  hypoxemia.  This is likely related to hypoventilation from residual effects of anesthesia.  He is willing to use NIPPV.  Will titrate oxygen to keep SpO2 > 90%.  Hold spiriva until he is able to effective use inhalers>>will use albuterol/atrovent nebulizer for now.  Avoid  dilaudid due to concern for respiratory depression>>use fentanyl prn for pain control.  Hopefully can avoid need for re-intubation.  Coralyn Helling, MD [facility] Pulmonary/Critical Care 06/12/2011, 2:49 PM Pager:  [phone] After 3pm call: [phone]

## 2011-06-12 NOTE — Anesthesia Postprocedure Evaluation (Signed)
  Anesthesia Post-op Note  Patient: Steve [name]  Procedure(s) Performed: Procedure(s) (LRB): HERNIA REPAIR INCISIONAL (N/A) INSERTION OF MESH (N/A)  Patient Location: PACU  Anesthesia Type: General  Level of Consciousness: awake and alert   Airway and Oxygen Therapy: Patient Spontanous Breathing  Post-op Pain: mild  Post-op Assessment: Post-op Vital signs reviewed, Patient's Cardiovascular Status Stable, Respiratory Function Stable, Patent Airway and No signs of Nausea or vomiting  Post-op Vital Signs: stable  Complications: No apparent anesthesia complications

## 2011-06-13 ENCOUNTER — Inpatient Hospital Stay (HOSPITAL_COMMUNITY): Payer: Medicare Other

## 2011-06-13 ENCOUNTER — Encounter (HOSPITAL_COMMUNITY): Payer: Self-pay | Admitting: Surgery

## 2011-06-13 DIAGNOSIS — I10 Essential (primary) hypertension: Secondary | ICD-10-CM

## 2011-06-13 LAB — BASIC METABOLIC PANEL
CO2: 25 mEq/L (ref 19–32)
Calcium: 9 mg/dL (ref 8.4–10.5)
Glucose, Bld: 123 mg/dL — ABNORMAL HIGH (ref 70–99)
Sodium: 136 mEq/L (ref 135–145)

## 2011-06-13 LAB — BLOOD GAS, ARTERIAL
Acid-base deficit: 1.6 mmol/L (ref 0.0–2.0)
O2 Saturation: 90.7 %
Patient temperature: 98.6
TCO2: 21.4 mmol/L (ref 0–100)

## 2011-06-13 LAB — CBC
MCH: 31.8 pg (ref 26.0–34.0)
MCV: 95.6 fL (ref 78.0–100.0)
Platelets: 161 10*3/uL (ref 150–400)
RBC: 4.59 MIL/uL (ref 4.22–5.81)

## 2011-06-13 MED ORDER — VITAMINS A & D EX OINT
TOPICAL_OINTMENT | CUTANEOUS | Status: AC
  Administered 2011-06-13: 5
  Filled 2011-06-13: qty 5

## 2011-06-13 MED ORDER — NALOXONE HCL 0.4 MG/ML IJ SOLN: 0.4000 mg | INTRAMUSCULAR | Status: DC | PRN

## 2011-06-13 MED ORDER — ALPRAZOLAM 0.25 MG PO TABS
0.2500 mg | ORAL_TABLET | Freq: Three times a day (TID) | ORAL | Status: DC | PRN
  Administered 2011-06-13 – 2011-06-16 (×4): 0.25 mg via ORAL
  Filled 2011-06-13 (×4): qty 1

## 2011-06-13 MED ORDER — DIPHENHYDRAMINE HCL 50 MG/ML IJ SOLN
12.5000 mg | Freq: Four times a day (QID) | INTRAMUSCULAR | Status: DC | PRN
  Administered 2011-06-14: 12.5 mg via INTRAVENOUS
  Filled 2011-06-13: qty 1

## 2011-06-13 MED ORDER — ONDANSETRON HCL 4 MG/2ML IJ SOLN
4.0000 mg | Freq: Four times a day (QID) | INTRAMUSCULAR | Status: DC | PRN
  Administered 2011-06-15: 4 mg via INTRAVENOUS
  Filled 2011-06-13: qty 2

## 2011-06-13 MED ORDER — BIOTENE DRY MOUTH MT LIQD
15.0000 mL | Freq: Two times a day (BID) | OROMUCOSAL | Status: DC
  Administered 2011-06-13 – 2011-06-15 (×5): 15 mL via OROMUCOSAL

## 2011-06-13 MED ORDER — CHLORHEXIDINE GLUCONATE 0.12 % MT SOLN
15.0000 mL | Freq: Two times a day (BID) | OROMUCOSAL | Status: DC
  Administered 2011-06-14 – 2011-06-15 (×3): 15 mL via OROMUCOSAL
  Filled 2011-06-13 (×2): qty 15

## 2011-06-13 MED ORDER — KETOROLAC TROMETHAMINE 15 MG/ML IJ SOLN
15.0000 mg | Freq: Four times a day (QID) | INTRAMUSCULAR | Status: DC
  Administered 2011-06-13 – 2011-06-16 (×9): 15 mg via INTRAVENOUS
  Filled 2011-06-13 (×18): qty 1

## 2011-06-13 MED ORDER — SODIUM CHLORIDE 0.9 % IJ SOLN: 9.0000 mL | INTRAMUSCULAR | Status: DC | PRN

## 2011-06-13 MED ORDER — HYDROMORPHONE 0.3 MG/ML IV SOLN
INTRAVENOUS | Status: DC
  Administered 2011-06-13: 1.9 mg via INTRAVENOUS
  Administered 2011-06-13: 12:00:00 via INTRAVENOUS
  Administered 2011-06-13: 2.39 mg via INTRAVENOUS
  Administered 2011-06-14: 1.9 mg via INTRAVENOUS
  Administered 2011-06-14: 0.9 mg via INTRAVENOUS
  Administered 2011-06-14: 1.7 mg via INTRAVENOUS
  Administered 2011-06-14: 0.8 mg via INTRAVENOUS
  Administered 2011-06-15: 0.599 mg via INTRAVENOUS
  Administered 2011-06-15 (×2): 0.2 mg via INTRAVENOUS
  Filled 2011-06-13 (×2): qty 25

## 2011-06-13 MED ORDER — DIPHENHYDRAMINE HCL 12.5 MG/5ML PO ELIX: 12.5000 mg | ORAL_SOLUTION | Freq: Four times a day (QID) | ORAL | Status: DC | PRN

## 2011-06-13 NOTE — Clinical Documentation Improvement (Signed)
RESPIRATORY FAILURE DOCUMENTATION CLARIFICATION QUERY   THIS DOCUMENT IS NOT A PERMANENT PART OF THE MEDICAL RECORD  TO RESPOND TO THE THIS QUERY, FOLLOW THE INSTRUCTIONS BELOW:  1. If needed, update documentation for the patient's encounter via the notes activity.  2. Access this query again and click edit on the In [employer].  3. After updating, or not, click F2 to complete all highlighted (required) fields concerning your review. Select "additional documentation in the medical record" OR "no additional documentation provided".  4. Click Sign note button.  5. The deficiency will fall out of your In Basket *Please let us know if you are not able to complete this workflow by phone or e-mail (listed below).  Please update your documentation within the medical record to reflect your response to this query.                                                                                    06/13/11  Dear Steve [name], Karie Schwalbe Marton Redwood,  In a better effort to capture your patient's severity of illness, reflect appropriate length of stay and utilization of resources, a review of the patient medical record has revealed the following indicators.    Based on your clinical judgment, please clarify and document in a progress note and/or discharge summary the clinical condition associated with the following supporting information:  In responding to this query please exercise your independent judgment.  The fact that a query is asked, does not imply that any particular answer is desired or expected.  Pt admitted for hernia repair,  According to H/P pt with COPD requiring ventilator support postoperative   Please clarify if "COPD requiring ventilator support postoperative" can be further specified as one of the diagnosis listed below and document in pn and d/c summary.   Possible Clinical Conditions?  _______Acute Respiratory Failure _______Acute on Chronic Respiratory Failure _______Chronic  Respiratory Failure _______Acute exacerbation of COPD/following surgery or trauma _______Other Condition________________ _______Cannot Clinically Determine    Supporting Information:  Risk Factors:  Surgery for hernia repair requiring mesh, COPD requiring ventilator support   Signs&Symptoms:   Radiology:  Treatment: tiotropium (SPIRIVA) inhalation ipratropium (ATROVENT) albuterol (PROVENTIL   HYDROcodone-acetaminophen (       You may use possible, probable, or suspect with inpatient documentation. possible, probable, suspected diagnoses MUST be documented at the time of discharge  Reviewed:  no additional documentation provided ljh  Thank You,  Enis Slipper  RN, BSN, CCDS Clinical Documentation Specialist [facility] HIM Dept Pager: [phone] / E-mail: [email or web address] Health Information Management [place]

## 2011-06-13 NOTE — Op Note (Signed)
NAMEMarland [name]  SHERRICK, ARAKI NO.:  [id number]  MEDICAL RECORD NO.:  [id number]  LOCATION:  1234                         FACILITY[facility]  [facility]  PHYSICIAN:  Velora Heckler, MD      DATE OF BIRTH:  April 12, 1933  DATE OF PROCEDURE:  06/12/2011                               OPERATIVE REPORT   PREOPERATIVE DIAGNOSIS:  Ventral incisional hernia.  POSTOPERATIVE DIAGNOSIS:  Ventral incisional hernia.  PROCEDURE:  Repair ventral incisional hernia with polypropylene mesh (Bard Soft Mesh)  SURGEON:  Velora Heckler, MD, FACS  ANESTHESIA:  General.  ESTIMATED BLOOD LOSS:  Minimal.  PREPARATION:  ChloraPrep.  COMPLICATIONS:  None.  INDICATIONS:  The patient is a 78 year old white male, referred by his vascular surgeon for repair of ventral incisional hernia.  The patient had undergone abdominal aortic aneurysm repair in November 2012.  Over the past 3 months, he has developed a significant bulge in the epigastrium which is continued to increase in size.  He now comes to the surgery for repair.  BODY OF REPORT:  Procedures done in OR #11 at the [facility]  The patient was brought to the operating room, placed in supine position on the operating room table.  Following administration of general anesthesia, the patient was positioned and then prepped and draped in the usual strict aseptic fashion.  After ascertaining that an adequate level of anesthesia had been achieved, the midline scar from the xiphoid process to the umbilicus was excised with the electrocautery used for hemostasis.  Hernia sac was opened and the peritoneal cavity was entered.  Adhesions to the anterior abdominal wall were taken down circumferentially.  Hernia sac was excised.  There appeared to be multiple fascial defects in the midline down to the level of the umbilicus.  These were connected.  Fascial edges of the rectus muscles were debrided.  The plane immediately anterior to the rectus  musculature was developed circumferentially with the electrocautery.  Skin flaps were extended laterally.  Anterior rectus sheath was incised bilaterally for relaxing incisions. The fascia of the rectus musculature was then closed in the midline with interrupted #1 Novafil simple sutures.  The repair was reinforced with a sheet of Bard soft mesh, 12 x 12 inches, trimmed to the appropriate dimensions.  It was secured to the anterior fascia circumferentially with interrupted #1 Novafil and 0 Novafil simple sutures.  Good hemostasis was noted.  The wound was irrigated copiously with warm saline.  Subcutaneous tissues were closed in the midline with a running 2-0 Vicryl suture.  Prior to closure of the subcutaneous tissues, two 19- [name] Blake drains were placed from inferior stab wounds and placed in the subcutaneous space bilaterally.  These were secured to the skin with 3-0 nylon sutures.  Skin edges were reapproximated with stainless steel staples.  Drains were placed to bulb suction.  Sterile dressings were applied.  Abdominal binder was applied.  The patient was awakened from anesthesia and brought to the recovery room.  The patient tolerated the procedure well.   Velora Heckler, MD, FACS   TMG/MEDQ  D:  06/12/2011  T:  06/13/2011  Job:  [id number]  cc:  Larina Earthly, M.D. [address] [place] [address]  Leslye Peer, MD [address] [place], [place] [address]  Charlynn Court, MD

## 2011-06-13 NOTE — Care Management Note (Unsigned)
    Page 1 of 1   06/13/2011     4:27:39 PM   CARE MANAGEMENT NOTE 06/13/2011  Patient:  Steve [name], Steve [name]   Account Number:  [id number]  Date Initiated:  06/13/2011  Documentation initiated by:  Lanier Clam  Subjective/Objective Assessment:   ADMITTED W/VENTRAL [email or web address]     Action/Plan:   FROM HOME   Anticipated DC Date:  06/19/2011   Anticipated DC Plan:  HOME/SELF CARE      DC Planning Services  CM consult      Choice offered to / List presented to:             Status of service:  In process, will continue to follow Medicare Important Message given?   (If response is "NO", the following Medicare IM given date fields will be blank) Date Medicare IM given:   Date Additional Medicare IM given:    Discharge Disposition:    Per UR Regulation:  Reviewed for med. necessity/level of care/duration of stay  If discussed at Long Length of Stay Meetings, dates discussed:    Comments:  06/13/11 [facility] RN,BSN NCM [phone]

## 2011-06-13 NOTE — Consult Note (Signed)
Name: Steve [name] MRN: [id number] DOB: July 13, 1933    LOS: 1  Referring Provider:  CCS Reason for Referral:  Known COPD, Hx of reintubation post surgery.  PULMONARY / CRITICAL CARE MEDICINE  HPI:  78 yo 2 ppd smoker , pulmonary pt of Dr. Delton Coombes who underwent ventral hernia repair 6/3 and was felt to be tenous post op and was to be placed in SDU. PCCM asked to provide support in his plan of care.  Of note he has severe OSA bur refuses to wear Cpap. Brief patient description:  78 yo wm post op ventral hernia repair, desats easily.  Events Since Admission: Post op ventral hernia repair. 6/3 admitted to ICU   Current Status: Somnolent but arousable Vital Signs: Temp:  [96.7 F (35.9 C)-98.9 F (37.2 C)] 97.7 F (36.5 C) (06/04 0800) Pulse Rate:  [59-92] 77  (06/04 0600) Resp:  [13-25] 20  (06/04 0600) BP: (118-201)/(49-91) 128/67 mmHg (06/04 0800) SpO2:  [84 %-96 %] 92 % (06/04 0903) FiO2 (%):  [35 %-50 %] 50 % (06/04 0903) Weight:  [202 lb 6.1 oz (91.8 kg)-205 lb 0.4 oz (93 kg)] 202 lb 6.1 oz (91.8 kg) (06/04 0320)  Physical Examination: General:  Sleepy WM Neuro:  Sleepy  but follows commands HEENT:  No jvd Neck:  No adenopathy Cardiovascular:  hsr Lungs:  Rhonchi bilateral Abdomen:  Dressing intact Musculoskeletal:  intact Skin:  warm  Patient Active Problem List  Diagnoses  . Lumbar radicular pain  . COPD (chronic obstructive pulmonary disease)  . Depression  . Aortic aneurysm  . BPH (benign prostatic hyperplasia)  . Insomnia  . Acute and chronic respiratory failure  . Hypertension  . Atrial fibrillation  . Hypersomnolence  . Decreased heart rate  . Dizziness  . Cough  . Abdominal wall hernia  . Abdominal aneurysm without mention of rupture  . Sleep apnea  . Incisional hernia  . Gait instability  . S/P repair of ventral hernia     ASSESSMENT AND PLAN  PULMONARY No results found for this basename: PHART:5,PCO2:5,PCO2ART:5,PO2ART:5,HCO3:5,O2SAT:5 in  the last 168 hours Ventilator Settings: Vent Mode:  [-] BIPAP FiO2 (%):  [35 %-50 %] 50 % Set Rate:  [14 bmp] 14 bmp PEEP:  [6 cmH20] 6 cmH20 Pressure Support:  [6 cmH20] 6 cmH20 CXR:   No results found.  ETT: 6/3>>>6/3  A:  Hypoxia , multifactorial in 78 yo smoker, untreated OSA, COPD, abd surgery with administration of narcotics. P:   -PRN NIMS -minimal narcotics -is/flutter valve -bd's  CARDIOVASCULAR No results found for this basename: TROPONINI:5,LATICACIDVEN:5, O2SATVEN:5,PROBNP:5 in the last 168 hours ECG:  Noted Lines: PIV  A: CAF/AAA repair/HTN P:  -per ccs  RENAL  Lab 06/13/11 0327 06/06/11 1445  NA 136 143  K 4.4 4.1  CL 101 104  CO2 25 28  BUN 18 18  CREATININE 0.92 1.11  CALCIUM 9.0 9.6  MG -- --  PHOS -- --   Intake/Output      06/03 0701 - 06/04 0700 06/04 0701 - 06/05 0700   I.V. (mL/kg) 2725 (29.7) 75 (0.8)   Other 150    Total Intake(mL/kg) 2875 (31.3) 75 (0.8)   Urine (mL/kg/hr) 1200 (0.5) 100   Drains 44 40   Blood 20    Total Output 1264 140   Net +1611 -65         Foley:    A:  No acute renal issue P:     GASTROINTESTINAL No results found for  this basename: AST:5,ALT:5,ALKPHOS:5,BILITOT:5,PROT:5,ALBUMIN:5 in the last 168 hours  A:  Post ventral herina repair P:   Per ccs  HEMATOLOGIC  Lab 06/13/11 0327 06/06/11 1445  HGB 14.6 14.9  HCT 43.9 45.3  PLT 161 195  INR -- --  APTT -- --   A:  No acute issue P:    INFECTIOUS  Lab 06/13/11 0327 06/06/11 1445  WBC 17.2* 9.3  PROCALCITON -- --   Cultures: Per ccs Antibiotics:   A:  No acute issue P:     ENDOCRINE No results found for this basename: GLUCAP:5 in the last 168 hours A:  No acute issue   P:     NEUROLOGIC  A:  Hypersomnolence post op. P:   -prn bipap may need reintubation  BEST PRACTICE / DISPOSITION Level of Care:  sdu Primary Service:  ccs Consultants:  pccm Code Status:  full Diet:  npo DVT Px:  pas GI Px:  ppi Skin Integrity:   abd dressing intact Social / Family:  Updated daughter at bedside  Charlie [facility] Minor ACNP [name] PCCM Pager [phone] till 3 pm If no answer page [phone] 06/13/2011, 9:27 AM  Patient seen and examined, agree with above note.  I dictated the care and orders written for this patient under my direction.  Koren Bound, M.D. [phone]

## 2011-06-13 NOTE — Progress Notes (Signed)
Patient ID: Steve [name], male   DOB: 1933/07/01, 78 y.o.   MRN: [id number]  General Surgery - [facility] - Progress Note  POD# 1  Subjective: Patient up in chair this AM.  Ambulated to nursing station.  Family at bedside.  Taking limited clear liquids.  Complains of pain.  Objective: Vital signs in last 24 hours: Temp:  [96.7 F (35.9 C)-98.9 F (37.2 C)] 97.7 F (36.5 C) (06/04 0800) Pulse Rate:  [59-92] 77  (06/04 0600) Resp:  [13-25] 20  (06/04 0600) BP: (118-201)/(49-91) 128/67 mmHg (06/04 0800) SpO2:  [84 %-96 %] 92 % (06/04 0903) FiO2 (%):  [35 %-50 %] 50 % (06/04 0903) Weight:  [202 lb 6.1 oz (91.8 kg)-205 lb 0.4 oz (93 kg)] 202 lb 6.1 oz (91.8 kg) (06/04 0320)    Intake/Output from previous day: 06/03 0701 - 06/04 0700 In: 2875 [I.V.:2725] Out: 1264 [Urine:1200; Drains:44; Blood:20]  Exam: HEENT - clear, not icteric Neck - soft Chest - clear bilaterally, shallow volumes, no wheeze Cor - RRR, no murmur Abd - binder on; dressing dry; few BS; JP's with thin serosanguinous output Ext - no significant edema Neuro - grossly intact, no focal deficits  Lab Results:   Basename 06/13/11 0327  WBC 17.2*  HGB 14.6  HCT 43.9  PLT 161     Basename 06/13/11 0327  NA 136  K 4.4  CL 101  CO2 25  GLUCOSE 123*  BUN 18  CREATININE 0.92  CALCIUM 9.0    Studies/Results: No results found.  Assessment / Plan: 1.  Status post ventral incisional hernia repair with mesh  - add PCA dilaudid for pain control  - add Toradol every 6 hours for pain control  - OOB, ambulate in halls 2.  COPD with respiratory compromise  - appreciate CCM consultation  - pulm toilet  - stay in stepdown unit today  Velora Heckler, MD, [facility] Office: [phone]  06/13/2011

## 2011-06-14 ENCOUNTER — Inpatient Hospital Stay (HOSPITAL_COMMUNITY): Payer: Medicare Other

## 2011-06-14 LAB — CBC
HCT: 43.5 % (ref 39.0–52.0)
MCHC: 32.4 g/dL (ref 30.0–36.0)
MCV: 97.8 fL (ref 78.0–100.0)
RDW: 14.5 % (ref 11.5–15.5)

## 2011-06-14 LAB — BASIC METABOLIC PANEL
BUN: 24 mg/dL — ABNORMAL HIGH (ref 6–23)
Calcium: 9.1 mg/dL (ref 8.4–10.5)
Creatinine, Ser: 1.21 mg/dL (ref 0.50–1.35)
GFR calc Af Amer: 65 mL/min — ABNORMAL LOW (ref >90)
GFR calc non Af Amer: 56 mL/min — ABNORMAL LOW (ref >90)
Potassium: 4.6 mEq/L (ref 3.5–5.1)

## 2011-06-14 MED ORDER — HALOPERIDOL LACTATE 5 MG/ML IJ SOLN
10.0000 mg | INTRAMUSCULAR | Status: DC | PRN
  Administered 2011-06-15: 10 mg via INTRAVENOUS
  Filled 2011-06-14: qty 2

## 2011-06-14 MED ORDER — SODIUM CHLORIDE 0.9 % IV BOLUS (SEPSIS)
500.0000 mL | INTRAVENOUS | Status: AC
  Administered 2011-06-14: 500 mL via INTRAVENOUS

## 2011-06-14 MED ORDER — HALOPERIDOL LACTATE 5 MG/ML IJ SOLN
5.0000 mg | Freq: Once | INTRAMUSCULAR | Status: AC
  Administered 2011-06-15: 5 mg via INTRAVENOUS
  Filled 2011-06-14: qty 1

## 2011-06-14 NOTE — Progress Notes (Signed)
Patient ID: Steve [name], male   DOB: 06/21/1933, 78 y.o.   MRN: [id number]  General Surgery - [facility] - Progress Note  POD# 2  Subjective: Patient more comfortable with less pain using PCA.  Hungry.  No nausea or emesis.  Objective: Vital signs in last 24 hours: Temp:  [97.7 F (36.5 C)-98.9 F (37.2 C)] 97.9 F (36.6 C) (06/05 0400) Pulse Rate:  [72-98] 76  (06/05 0600) Resp:  [11-24] 15  (06/05 0600) BP: (102-134)/(53-68) 129/53 mmHg (06/05 0500) SpO2:  [92 %-100 %] 99 % (06/05 0600) FiO2 (%):  [50 %] 50 % (06/04 0903)    Intake/Output from previous day: 06/04 0701 - 06/05 0700 In: 2205 [P.O.:380; I.V.:1725] Out: 1225 [Urine:1120; Drains:105]  Exam: HEENT - clear, not icteric Neck - soft Chest - clear bilaterally; no wheeze; "panting" type respirations Cor - RRR, no murmur Abd - soft, mild distension; dressings dry; small serosanguinous in JP's; few BS; no flatus; no BM Ext - no significant edema Neuro - grossly intact, no focal deficits  Lab Results:   Basename 06/14/11 0327 06/13/11 0327  WBC 15.2* 17.2*  HGB 14.1 14.6  HCT 43.5 43.9  PLT 139* 161     Basename 06/14/11 0327 06/13/11 0327  NA 133* 136  K 4.6 4.4  CL 99 101  CO2 26 25  GLUCOSE 121* 123*  BUN 24* 18  CREATININE 1.21 0.92  CALCIUM 9.1 9.0    Studies/Results: Dg Chest Port 1 View  06/14/2011  *RADIOLOGY REPORT*  Clinical Data: COPD and hypertension.  PORTABLE CHEST - 1 VIEW  Comparison: 06/13/2011.  Findings: The heart remains mildly enlarged.  Persistent low lung volumes with vascular crowding and bibasilar atelectasis.  Small pleural effusions are suspected.  IMPRESSION: Persistent low lung volumes with vascular crowding and bibasilar atelectasis.  Original Report Authenticated By: P. Loralie Champagne, M.D.   Dg Chest Port 1 View  06/13/2011  *RADIOLOGY REPORT*  Clinical Data: COPD  PORTABLE CHEST - 1 VIEW  Comparison: 06/06/2011  Findings: The heart size appears  mildly enlarged.  The lung volumes are low.  Bilateral lower lobe atelectasis is noted.  Mild interstitial edema is present.  IMPRESSION:  1.  Low lung volumes and bibasilar atelectasis. 2.  Cardiac enlargement and mild edema.  Original Report Authenticated By: Rosealee Albee, M.D.    Assessment / Plan: 1.  Status post VH repair with mesh  - advance to Regular diet  - OOB, ambulate  - drains to remain in place  - continue PCA, complete Toradol 2.  COPD, respiratory failure  - per CCM  Velora Heckler, MD, [facility] Office: [phone]  06/14/2011

## 2011-06-14 NOTE — Progress Notes (Addendum)
Name: Steve [name] MRN: [id number] DOB: [birth year]    LOS: 2  Referring Provider:  CCS Reason for Referral:  Known COPD, Hx of reintubation post surgery.  PULMONARY / CRITICAL CARE MEDICINE  HPI:  78 yo 2 ppd smoker , pulmonary pt of Dr. Delton Coombes who underwent ventral hernia repair 6/3 and was felt to be tenous post op and was to be placed in SDU. PCCM asked to provide support in his plan of care.  Of note he has severe OSA bur refuses to wear Cpap. Brief patient description:  78 yo wm post op ventral hernia repair, desats easily.  Events Since Admission: Post op ventral hernia repair. 6/3 admitted to ICU   Current Status: Somnolent but arousable Vital Signs: Temp:  [97.9 F (36.6 C)-98.9 F (37.2 C)] 98 F (36.7 C) (06/05 0800) Pulse Rate:  [72-98] 76  (06/05 0600) Resp:  [11-24] 16  (06/05 0800) BP: (102-134)/(53-68) 129/53 mmHg (06/05 0500) SpO2:  [93 %-100 %] 97 % (06/05 0904)  Physical Examination: General:  Awake and eating Neuro:  intact HEENT:  No jvd Neck:  No adenopathy Cardiovascular:  hsr Lungs:  Decrease in bases Abdomen:  Dressing intact Musculoskeletal:  intact Skin:  warm   ASSESSMENT AND PLAN  PULMONARY  Lab 06/13/11 1925  PHART 7.337*  PCO2ART 45.9*  PO2ART 60.6*  HCO3 24.0  O2SAT 90.7   Ventilator Settings:   CXR:   Dg Chest Port 1 View  06/14/2011  *RADIOLOGY REPORT*  Clinical Data: COPD and hypertension.  PORTABLE CHEST - 1 VIEW  Comparison: 06/13/2011.  Findings: The heart remains mildly enlarged.  Persistent low lung volumes with vascular crowding and bibasilar atelectasis.  Small pleural effusions are suspected.  IMPRESSION: Persistent low lung volumes with vascular crowding and bibasilar atelectasis.  Original Report Authenticated By: P. Loralie Champagne, M.D.   Dg Chest Port 1 View  06/13/2011  *RADIOLOGY REPORT*  Clinical Data: COPD  PORTABLE CHEST - 1 VIEW  Comparison: 06/06/2011  Findings: The heart size appears mildly enlarged.  The  lung volumes are low.  Bilateral lower lobe atelectasis is noted.  Mild interstitial edema is present.  IMPRESSION:  1.  Low lung volumes and bibasilar atelectasis. 2.  Cardiac enlargement and mild edema.  Original Report Authenticated By: Rosealee Albee, M.D.    ETT: 6/3>>>6/3  A:  Hypoxia , multifactorial in 77 yo smoker, untreated OSA, COPD, abd surgery with administration of narcotics. P:   -PRN NIMS -minimal narcotics -is/flutter valve -bd's  CARDIOVASCULAR No results found for this basename: TROPONINI:5,LATICACIDVEN:5, O2SATVEN:5,PROBNP:5 in the last 168 hours ECG:  Noted Lines: PIV  A: CAF/AAA repair/HTN P:  -per ccs  RENAL  Lab 06/14/11 0327 06/13/11 0327  NA 133* 136  K 4.6 4.4  CL 99 101  CO2 26 25  BUN 24* 18  CREATININE 1.21 0.92  CALCIUM 9.1 9.0  MG -- --  PHOS -- --   Intake/Output      06/04 0701 - 06/05 0700 06/05 0701 - 06/06 0700   P.O. 380    I.V. (mL/kg) 1800 (19.6) 150 (1.6)   Other 100    Total Intake(mL/kg) 2280 (24.8) 150 (1.6)   Urine (mL/kg/hr) 1120 (0.5) 225   Drains 105    Blood     Total Output 1225 225   Net +1055 -75         Foley:    A:  No acute renal issue P:     GASTROINTESTINAL No results found  for this basename: AST:5,ALT:5,ALKPHOS:5,BILITOT:5,PROT:5,ALBUMIN:5 in the last 168 hours  A:  Post ventral herina repair P:   Per ccs  HEMATOLOGIC  Lab 06/14/11 0327 06/13/11 0327  HGB 14.1 14.6  HCT 43.5 43.9  PLT 139* 161  INR -- --  APTT -- --   A:  No acute issue P:    INFECTIOUS  Lab 06/14/11 0327 06/13/11 0327  WBC 15.2* 17.2*  PROCALCITON -- --   Cultures: Per ccs Antibiotics: none  A:  No acute issue P:     ENDOCRINE No results found for this basename: GLUCAP:5 in the last 168 hours A:  No acute issue   P:     NEUROLOGIC  A:  Hypersomnolence post op. Resolved 6/5 P:   -prn bipap   Global: Ok with PCCM to tx to floor.   BEST PRACTICE / DISPOSITION Level of Care:  sdu Primary  Service:  ccs Consultants:  pccm Code Status:  full Diet:  npo DVT Px:  pas GI Px:  ppi Skin Integrity:  abd dressing intact Social / Family:    [name] Minor ACNP [name] PCCM Pager [phone] till 3 pm If no answer page [phone] 06/14/2011, 9:37 AM  Patient seen and examined, agree with above note.  I dictated the care and orders written for this patient under my direction.  Koren Bound, M.D. [phone]

## 2011-06-14 NOTE — Progress Notes (Signed)
eLink Physician-Brief Progress Note Patient Name: Steve [name] DOB: 1933-09-28 MRN: [id number]  Date of Service  06/14/2011   HPI/Events of Note   Pt oliguric  eICU Interventions  Give volume   Intervention Category Intermediate Interventions: Oliguria - evaluation and management  Shan Levans 06/14/2011, 9:26 PM

## 2011-06-14 NOTE — Progress Notes (Signed)
eLink Physician-Brief Progress Note Patient Name: Steve [name] DOB: 1933/04/20 MRN: [id number]  Date of Service  06/14/2011   HPI/Events of Note  Agitation/Confusion - not responding to xanax   eICU Interventions  Plan: Order for haldol dosing 12 lead EKG in AM to evaluate QTc   Intervention Category Intermediate Interventions: Other:  [name] 06/14/2011, 11:59 PM

## 2011-06-15 NOTE — Progress Notes (Signed)
Name: Steve [name] MRN: [id number] DOB: [birth year]    LOS: 3  Referring Provider:  CCS Reason for Referral:  Known COPD, Hx of reintubation post surgery.  PULMONARY / CRITICAL CARE MEDICINE  HPI:  78 yo 2 ppd smoker , pulmonary pt of Dr. Delton Coombes who underwent ventral hernia repair 6/3 and was felt to be tenous post op and was to be placed in SDU. PCCM asked to provide support in his plan of care.  Of note he has severe OSA bur refuses to wear Cpap. Brief patient description:  78 yo wm post op ventral hernia repair, desats easily.  Events Since Admission: Post op ventral hernia repair. 6/3 admitted to ICU  6/6 ambulated in hall.  Current Status: Sitting in hair Vital Signs: Temp:  [97 F (36.1 C)-98.2 F (36.8 C)] 97.4 F (36.3 C) (06/06 0800) Pulse Rate:  [60-99] 66  (06/06 0800) Resp:  [14-25] 20  (06/06 0800) BP: (105-195)/(45-87) 165/80 mmHg (06/06 0800) SpO2:  [94 %-100 %] 97 % (06/06 0800) Weight:  [210 lb 8.6 oz (95.5 kg)] 210 lb 8.6 oz (95.5 kg) (06/06 0400)  Physical Examination: General:  Awake and alert Neuro:  intact HEENT:  No jvd Neck:  No adenopathy Cardiovascular:  hsr Lungs:  Decrease in bases Abdomen:  Dressing intact Musculoskeletal:  intact Skin:  warm   ASSESSMENT AND PLAN  PULMONARY  Lab 06/13/11 1925  PHART 7.337*  PCO2ART 45.9*  PO2ART 60.6*  HCO3 24.0  O2SAT 90.7   Ventilator Settings:   CXR:   Dg Chest Port 1 View  06/14/2011  *RADIOLOGY REPORT*  Clinical Data: COPD and hypertension.  PORTABLE CHEST - 1 VIEW  Comparison: 06/13/2011.  Findings: The heart remains mildly enlarged.  Persistent low lung volumes with vascular crowding and bibasilar atelectasis.  Small pleural effusions are suspected.  IMPRESSION: Persistent low lung volumes with vascular crowding and bibasilar atelectasis.  Original Report Authenticated By: P. Loralie Champagne, M.D.   Dg Chest Port 1 View  06/13/2011  *RADIOLOGY REPORT*  Clinical Data: COPD  PORTABLE CHEST - 1  VIEW  Comparison: 06/06/2011  Findings: The heart size appears mildly enlarged.  The lung volumes are low.  Bilateral lower lobe atelectasis is noted.  Mild interstitial edema is present.  IMPRESSION:  1.  Low lung volumes and bibasilar atelectasis. 2.  Cardiac enlargement and mild edema.  Original Report Authenticated By: Rosealee Albee, M.D.    ETT: 6/3>>>6/3  A:  Hypoxia , multifactorial in 78 yo smoker, untreated OSA, COPD, abd surgery with administration of narcotics. P:   -PRN NIMS -minimal narcotics -is/flutter valve -bd's  CARDIOVASCULAR No results found for this basename: TROPONINI:5,LATICACIDVEN:5, O2SATVEN:5,PROBNP:5 in the last 168 hours ECG:  Noted Lines: PIV  A: CAF/AAA repair/HTN P:  -per ccs  RENAL  Lab 06/14/11 0327 06/13/11 0327  NA 133* 136  K 4.6 4.4  CL 99 101  CO2 26 25  BUN 24* 18  CREATININE 1.21 0.92  CALCIUM 9.1 9.0  MG -- --  PHOS -- --   Intake/Output      06/05 0701 - 06/06 0700 06/06 0701 - 06/07 0700   P.O.     I.V. (mL/kg) 1650 (17.3) 75.2 (0.8)   Other 20    IV Piggyback 500    Total Intake(mL/kg) 2170 (22.7) 75.2 (0.8)   Urine (mL/kg/hr) 1993 (0.9) 425   Drains 70    Total Output 2063 425   Net +107 -349.8  Foley:    A:  No acute renal issue P:     GASTROINTESTINAL No results found for this basename: AST:5,ALT:5,ALKPHOS:5,BILITOT:5,PROT:5,ALBUMIN:5 in the last 168 hours  A:  Post ventral herina repair P:   Per ccs  HEMATOLOGIC  Lab 06/14/11 0327 06/13/11 0327  HGB 14.1 14.6  HCT 43.5 43.9  PLT 139* 161  INR -- --  APTT -- --   A:  No acute issue P:    INFECTIOUS  Lab 06/14/11 0327 06/13/11 0327  WBC 15.2* 17.2*  PROCALCITON -- --   Cultures: Per ccs Antibiotics: none  A:  No acute issue P:     ENDOCRINE No results found for this basename: GLUCAP:5 in the last 168 hours A:  No acute issue   P:     NEUROLOGIC  A:  Hypersomnolence post op. Resolved 6/5 P:   -prn bipap    Global: Ok with PCCM to tx to floor.   BEST PRACTICE / DISPOSITION Level of Care:  sdu Primary Service:  ccs Consultants:  pccm Code Status:  full Diet:  npo DVT Px:  pas GI Px:  ppi Skin Integrity:  abd dressing intact Social / Family:    [name] Minor ACNP [name] PCCM Pager [phone] till 3 pm If no answer page [phone] 06/15/2011, 9:22 AM  Transfer patient to floor once ok with surgery.   Patient seen and examined, agree with above note.  I dictated the care and orders written for this patient under my direction.  Koren Bound, M.D. [phone]

## 2011-06-15 NOTE — Progress Notes (Signed)
Patient ID: Steve [name], male   DOB: 1933/12/13, 78 y.o.   MRN: [id number]  General Surgery - [facility] - Progress Note  POD# 3  Subjective: Patient up in chair with wife at side.  Sleepy.  No nausea.  Pain improved.  States had BM.  Objective: Vital signs in last 24 hours: Temp:  [97 F (36.1 C)-98.2 F (36.8 C)] 97.4 F (36.3 C) (06/06 0800) Pulse Rate:  [60-99] 66  (06/06 0800) Resp:  [14-25] 20  (06/06 0800) BP: (105-195)/(45-87) 165/80 mmHg (06/06 0800) SpO2:  [94 %-99 %] 97 % (06/06 0800) Weight:  [210 lb 8.6 oz (95.5 kg)] 210 lb 8.6 oz (95.5 kg) (06/06 0400) Last BM Date: 06/12/11  Intake/Output from previous day: 06/05 0701 - 06/06 0700 In: 2170 [I.V.:1650; IV Piggyback:500] Out: 2063 [KGMWN:0272; Drains:70]  Exam: HEENT - clear, not icteric Neck - soft Chest - clear bilaterally Cor - RRR, no murmur Abd - soft; BS present; binder on Ext - no significant edema Neuro - grossly intact, no focal deficits  Lab Results:   Basename 06/14/11 0327 06/13/11 0327  WBC 15.2* 17.2*  HGB 14.1 14.6  HCT 43.5 43.9  PLT 139* 161     Basename 06/14/11 0327 06/13/11 0327  NA 133* 136  K 4.6 4.4  CL 99 101  CO2 26 25  GLUCOSE 121* 123*  BUN 24* 18  CREATININE 1.21 0.92  CALCIUM 9.1 9.0    Studies/Results: Dg Chest Port 1 View  06/14/2011  *RADIOLOGY REPORT*  Clinical Data: COPD and hypertension.  PORTABLE CHEST - 1 VIEW  Comparison: 06/13/2011.  Findings: The heart remains mildly enlarged.  Persistent low lung volumes with vascular crowding and bibasilar atelectasis.  Small pleural effusions are suspected.  IMPRESSION: Persistent low lung volumes with vascular crowding and bibasilar atelectasis.  Original Report Authenticated By: P. Loralie Champagne, M.D.    Assessment / Plan: 1.  Status post VH repair with mesh  - Regular diet  - OOB, ambulate  - discontinue PCA  - po Vicodin  - transfer to [address] [place] today 2.  COPD  - per  CCM/pulmonary  Velora Heckler, MD, [facility] Office: [phone]  06/15/2011

## 2011-06-16 ENCOUNTER — Inpatient Hospital Stay (HOSPITAL_COMMUNITY): Payer: Medicare Other

## 2011-06-16 MED ORDER — HYDROCODONE-ACETAMINOPHEN 5-325 MG PO TABS: 1.0000 | ORAL_TABLET | ORAL | Status: AC | PRN

## 2011-06-16 MED ORDER — FUROSEMIDE 20 MG PO TABS
20.0000 mg | ORAL_TABLET | Freq: Once | ORAL | Status: AC
  Administered 2011-06-16: 20 mg via ORAL
  Filled 2011-06-16: qty 1

## 2011-06-16 MED ORDER — TIOTROPIUM BROMIDE MONOHYDRATE 18 MCG IN CAPS
18.0000 ug | ORAL_CAPSULE | Freq: Every day | RESPIRATORY_TRACT | Status: DC
  Administered 2011-06-16: 18 ug via RESPIRATORY_TRACT
  Filled 2011-06-16: qty 5

## 2011-06-16 NOTE — Progress Notes (Signed)
Name: Steve [name] MRN: [id number] DOB: Jun 09, 1933    LOS: 4  Referring Provider:  CCS Reason for Referral:  Known COPD, Hx of reintubation post surgery.  PULMONARY / CRITICAL CARE MEDICINE  HPI:  78 yo 2 ppd smoker , pulmonary pt of Dr. Delton Coombes who underwent ventral hernia repair 6/3 and was felt to be tenous post op and was to be placed in SDU. PCCM asked to provide support in his plan of care.  Of note he has severe OSA bur refuses to wear Cpap. Brief patient description:  78 yo wm post op ventral hernia repair, desats easily.  Events Since Admission: Post op ventral hernia repair. 6/3 admitted to ICU  6/6 ambulated in hall.  Current Status: Anxious to go home.  Vital Signs: Temp:  [97.4 F (36.3 C)-98.5 F (36.9 C)] 98.4 F (36.9 C) (06/07 0618) Pulse Rate:  [65-72] 65  (06/07 0618) Resp:  [16-18] 18  (06/07 0618) BP: (112-155)/(50-85) 155/82 mmHg (06/07 0618) SpO2:  [93 %-98 %] 94 % (06/07 0918)  Physical Examination: General:  Awake and alert Neuro:  intact HEENT:  No jvd Neck:  No adenopathy Cardiovascular:  hsr Lungs:  Decrease in bases Abdomen:  Dressing intact Musculoskeletal:  intact Skin:  warm   BMET    Component Value Date/Time   NA 133* 06/14/2011 0327   K 4.6 06/14/2011 0327   CL 99 06/14/2011 0327   CO2 26 06/14/2011 0327   GLUCOSE 121* 06/14/2011 0327   BUN 24* 06/14/2011 0327   CREATININE 1.21 06/14/2011 0327   CREATININE 1.16 09/16/2010 1514   CALCIUM 9.1 06/14/2011 0327   GFRNONAA 56* 06/14/2011 0327   GFRAA 65* 06/14/2011 0327    CBC    Component Value Date/Time   WBC 15.2* 06/14/2011 0327   RBC 4.45 06/14/2011 0327   HGB 14.1 06/14/2011 0327   HCT 43.5 06/14/2011 0327   PLT 139* 06/14/2011 0327   MCV 97.8 06/14/2011 0327   MCH 31.7 06/14/2011 0327   MCHC 32.4 06/14/2011 0327   RDW 14.5 06/14/2011 0327   LYMPHSABS 1.6 09/06/2010 2101   MONOABS 0.7 09/06/2010 2101   EOSABS 0.2 09/06/2010 2101   BASOSABS 0.0 09/06/2010 2101    Dg Chest Port 1 View  06/16/2011   *RADIOLOGY REPORT*  Clinical Data: Shortness of breath.  PORTABLE CHEST - 1 VIEW  Comparison: 06/14/2011.  Findings: The heart is enlarged but stable.  There is slight worsening aeration with developing interstitial edema.  Persistent areas of atelectasis.  No definite effusions.  IMPRESSION: Worsening lung aeration with probable interstitial pulmonary edema.  Original Report Authenticated By: P. Loralie Champagne, M.D.     ASSESSMENT AND PLAN  PULMONARY   ETT: 6/3>>>6/3  A:  Hypoxia , multifactorial in 78 yo smoker, untreated OSA, COPD, abd surgery with administration of narcotics, interstitial edema. P:   -minimal narcotics -is/flutter valve -bd's -d/c IV fluid -give lasix po x one 6/7  CARDIOVASCULAR  A: CAF/AAA repair/HTN P:  -per ccs  GASTROINTESTINAL   A:  Post ventral herina repair P:   Per ccs   NEUROLOGIC  A:  Hypersomnolence post op. Resolved 6/5 P:   -prn bipap   Global: Ok with PCCM to tx to floor.   BEST PRACTICE / DISPOSITION Level of Care:  sdu Primary Service:  ccs Consultants:  pccm Code Status:  full Diet:  Regular diet DVT Px:  pas GI Px:  ppi Skin Integrity:  abd dressing intact  Okay for d/c  home from pulmonary standpoint.  Will be available as needed over weekend, and f/u on Monday if pt remains in hospital.  If d/c home over weekend, then advised pt to schedule f/u appt with Dr. Delton Coombes in July 2013.  Coralyn Helling, MD [facility]/Critical Care 06/16/2011, 1:35 PM Pager:  [phone] After 3pm call: [phone]

## 2011-06-16 NOTE — Discharge Summary (Signed)
Physician Discharge Summary [facility]  Patient ID: Steve [name] MRN: [id number] DOB/AGE: [birth year] 78 y.o.  Admit date: 06/12/2011 Discharge date: 06/16/2011  Admission Diagnoses:  Ventral incisional hernia, COPD, hx of resp failure  Discharge Diagnoses:  Active Problems:  Lumbar radicular pain  COPD (chronic obstructive pulmonary disease)  Depression  Aortic aneurysm  BPH (benign prostatic hyperplasia)  Insomnia  Acute and chronic respiratory failure  Hypertension  Atrial fibrillation  Hypersomnolence  Decreased heart rate  Abdominal wall hernia  Abdominal aneurysm without mention of rupture  Sleep apnea  S/P repair of ventral hernia   Discharged Condition: fair  Hospital Course: patient admitted to surgical service after repair of large ventral incisional hernia with mesh.  Patient monitored in stepdown unit due to COPD and hx of ventilator dependent respiratory failure after anesthesia.  Patient seen in consultation by CCM / pulmonary medicine throughout hospital course.  Diet advanced slowly.  Patient became ambulatory.  Pain controlled with PCA, then oral narcotics.  Tolerated diet.  Prepared for discharge on POD#4.  Consults: pulmonary/intensive care  Significant Diagnostic Studies: radiology: CXR: atelectasis bilaterally  Treatments: repair ventral incisional hernia with mesh  Discharge Exam: Blood pressure 180/83, pulse 61, temperature 97.6 F (36.4 C), temperature source Oral, resp. rate 18, height 5\' 9"  (1.753 m), weight 210 lb 8.6 oz (95.5 kg), SpO2 94.00%. HEENT - clear Chest - good BS bilat, no rales Cor - RRR Abd - wound clear and dry; thin serosanguinous from JP drains; BS present; flatus present Ext - no edema  Disposition: Home with family  Discharge Orders    Future Appointments: Provider: Department: Dept Phone: Center:   06/27/2011 1:30 PM Leslye Peer, MD [facility] [phone] None   06/28/2011 11:00 AM Velora Heckler, MD [facility] [place] [phone] None   08/01/2011 8:30 AM Milas Gain, MD [facility] [phone] None     Future Orders Please Complete By Expires   Diet - low sodium heart healthy      Increase activity slowly      Discharge instructions      Comments:   [facility]  UMBILICAL OR INGUINAL HERNIA REPAIR POST OP INSTRUCTIONS  Always review your discharge instruction sheet given to you by the facility where your surgery was performed.  A  prescription for pain medication may be given to you upon discharge.  Take your pain medication as prescribed, if needed.  If narcotic pain medicine is not needed, then you may take acetaminophen (Tylenol) or ibuprofen (Advil) as needed. Take your usually prescribed medications unless otherwise directed. If you need a refill on your pain medication, please contact your pharmacy.  They will contact our office to request authorization. Prescriptions will not be filled after 5 pm daily or on weekends. You should follow a light diet the first 24 hours after arrival home, such as soup and crackers, etc.  Be sure to include lots of fluids daily.  Resume your normal diet the day after surgery. Most patients will experience some swelling and bruising around the umbilicus or in the groin and scrotum.  Ice packs and reclining will help.  Swelling and bruising can take several days to resolve.  It is common to experience some constipation if taking pain medication after surgery.  Increasing fluid intake and taking a stool softener (such as Colace) will usually help or prevent this problem from occurring.  A mild laxative (Milk of Magnesia or Miralax) should be taken according  to package directions if there are no bowel movements after 48 hours. Unless discharge instructions indicate otherwise, you may remove your bandages 24-48 hours after surgery, and you may shower at that time.  You may have steri-strips (small skin tapes) in place  directly over the incision.  These strips should be left on the skin for 7-10 days.  If your surgeon used skin glue on the incision, you may shower in 24 hours.  The glue will flake off over the next 2-3 weeks.  Any sutures or staples will be removed at the office during your follow-up visit. ACTIVITIES:  You may resume regular (light) daily activities beginning the next day--such as daily self-care, walking, climbing stairs--gradually increasing activities as tolerated.  You may have sexual intercourse when it is comfortable.  Refrain from any heavy lifting or straining until approved by your doctor.  You may drive when you are no longer taking prescription pain medication, you can comfortably wear a seatbelt, and you can safely maneuver your car and apply brakes. You should see your doctor in the office for a follow-up appointment approximately 2-3 weeks after your surgery.  Make sure that you call for this appointment within a day or two after you arrive home to insure a convenient appointment time.  WHEN TO CALL YOUR DOCTOR: Fever over 101.0 Inability to urinate Nausea and/or vomiting Extreme swelling or bruising Continued bleeding from incision. Increased pain, redness, or drainage from the incision The clinic staff is available to answer your questions during regular business hours.  Please don't hesitate to call and ask to speak to one of the nurses for clinical concerns.  If you have a medical emergency, go to the nearest emergency room or call 911.  A surgeon from [facility] is always on call at the hospital.   [address], [place], [place]  [address]  [address] [address], [place], [place]   [address] [phone] ? [phone] ? FAX [phone] Website: [email or web address]    Discharge wound care:      Comments:   Drain care as instructed.  Measure output and record and bring to office visit.  Dry gauze around drains.  May shower.  Wear binder at  all times except when bathing or when washing binder.  Velora Heckler, MD, [facility] Office: [phone]       Medication List  As of 06/16/2011  4:28 PM   TAKE these medications         ALPRAZolam 0.25 MG tablet   Commonly known as: XANAX   Take 0.25 mg by mouth at bedtime as needed.      cefdinir 300 MG capsule   Commonly known as: OMNICEF   Take 1 capsule (300 mg total) by mouth 2 (two) times daily.      HYDROcodone-acetaminophen 5-325 MG per tablet   Commonly known as: NORCO   Take 1-2 tablets by mouth every 4 (four) hours as needed for pain.      tiotropium 18 MCG inhalation capsule   Commonly known as: SPIRIVA   Place 1 capsule (18 mcg total) into inhaler and inhale daily.      VENTOLIN HFA 108 (90 BASE) MCG/ACT inhaler   Generic drug: albuterol   Inhale 2 puffs into the lungs every 4 (four) hours as needed. For shortness of breath           Velora Heckler, MD, [facility] Office: [phone]  Signed: Velora Heckler 06/16/2011, 4:28 PM

## 2011-06-17 ENCOUNTER — Inpatient Hospital Stay (HOSPITAL_COMMUNITY)
Admission: EM | Admit: 2011-06-17 | Discharge: 2011-06-20 | DRG: 190 | Disposition: A | Discharge status: Home / Self Care | Payer: Medicare Other | Attending: Internal Medicine | Admitting: Internal Medicine

## 2011-06-17 ENCOUNTER — Emergency Department (HOSPITAL_COMMUNITY): Payer: Medicare Other

## 2011-06-17 ENCOUNTER — Encounter (HOSPITAL_COMMUNITY): Payer: Self-pay | Admitting: *Deleted

## 2011-06-17 DIAGNOSIS — I4891 Unspecified atrial fibrillation: Secondary | ICD-10-CM | POA: Diagnosis present

## 2011-06-17 DIAGNOSIS — Z720 Tobacco use: Secondary | ICD-10-CM | POA: Diagnosis present

## 2011-06-17 DIAGNOSIS — I359 Nonrheumatic aortic valve disorder, unspecified: Secondary | ICD-10-CM | POA: Diagnosis present

## 2011-06-17 DIAGNOSIS — I10 Essential (primary) hypertension: Secondary | ICD-10-CM

## 2011-06-17 DIAGNOSIS — I350 Nonrheumatic aortic (valve) stenosis: Secondary | ICD-10-CM | POA: Diagnosis present

## 2011-06-17 DIAGNOSIS — J441 Chronic obstructive pulmonary disease with (acute) exacerbation: Secondary | ICD-10-CM | POA: Diagnosis present

## 2011-06-17 DIAGNOSIS — I519 Heart disease, unspecified: Secondary | ICD-10-CM | POA: Diagnosis present

## 2011-06-17 DIAGNOSIS — R0602 Shortness of breath: Secondary | ICD-10-CM | POA: Diagnosis present

## 2011-06-17 DIAGNOSIS — Z8719 Personal history of other diseases of the digestive system: Secondary | ICD-10-CM

## 2011-06-17 DIAGNOSIS — I480 Paroxysmal atrial fibrillation: Secondary | ICD-10-CM | POA: Diagnosis present

## 2011-06-17 DIAGNOSIS — R0600 Dyspnea, unspecified: Secondary | ICD-10-CM

## 2011-06-17 DIAGNOSIS — J189 Pneumonia, unspecified organism: Secondary | ICD-10-CM

## 2011-06-17 DIAGNOSIS — Z9889 Other specified postprocedural states: Secondary | ICD-10-CM

## 2011-06-17 DIAGNOSIS — F17200 Nicotine dependence, unspecified, uncomplicated: Secondary | ICD-10-CM | POA: Diagnosis present

## 2011-06-17 DIAGNOSIS — J438 Other emphysema: Secondary | ICD-10-CM

## 2011-06-17 DIAGNOSIS — K439 Ventral hernia without obstruction or gangrene: Secondary | ICD-10-CM

## 2011-06-17 DIAGNOSIS — I509 Heart failure, unspecified: Secondary | ICD-10-CM

## 2011-06-17 DIAGNOSIS — N390 Urinary tract infection, site not specified: Secondary | ICD-10-CM | POA: Diagnosis present

## 2011-06-17 DIAGNOSIS — J449 Chronic obstructive pulmonary disease, unspecified: Secondary | ICD-10-CM

## 2011-06-17 LAB — DIFFERENTIAL
Basophils Absolute: 0 K/uL (ref 0.0–0.1)
Basophils Relative: 0 % (ref 0–1)
Eosinophils Absolute: 0.4 K/uL (ref 0.0–0.7)
Eosinophils Relative: 5 % (ref 0–5)
Lymphocytes Relative: 16 % (ref 12–46)
Lymphs Abs: 1.2 K/uL (ref 0.7–4.0)
Monocytes Absolute: 0.5 K/uL (ref 0.1–1.0)
Monocytes Relative: 6 % (ref 3–12)
Neutro Abs: 5.8 K/uL (ref 1.7–7.7)
Neutrophils Relative %: 73 % (ref 43–77)

## 2011-06-17 LAB — BLOOD GAS, ARTERIAL
Acid-Base Excess: 1 mmol/L (ref 0.0–2.0)
Bicarbonate: 24.1 meq/L — ABNORMAL HIGH (ref 20.0–24.0)
Drawn by: 340271
FIO2: 0.28 %
O2 Saturation: 91.5 %
Patient temperature: 98.6
TCO2: 20.7 mmol/L (ref 0–100)
pCO2 arterial: 35.4 mmHg (ref 35.0–45.0)
pH, Arterial: 7.449 (ref 7.350–7.450)
pO2, Arterial: 61 mmHg — ABNORMAL LOW (ref 80.0–100.0)

## 2011-06-17 LAB — TROPONIN I: Troponin I: <0.3 ng/mL (ref <0.30)

## 2011-06-17 LAB — CBC
HCT: 39.9 % (ref 39.0–52.0)
Hemoglobin: 13.5 g/dL (ref 13.0–17.0)
MCH: 31.8 pg (ref 26.0–34.0)
MCHC: 33.8 g/dL (ref 30.0–36.0)
MCV: 93.9 fL (ref 78.0–100.0)
Platelets: 194 K/uL (ref 150–400)
RBC: 4.25 MIL/uL (ref 4.22–5.81)
RDW: 14 % (ref 11.5–15.5)
WBC: 8 K/uL (ref 4.0–10.5)

## 2011-06-17 LAB — BASIC METABOLIC PANEL
BUN: 15 mg/dL (ref 6–23)
CO2: 26 mEq/L (ref 19–32)
Calcium: 9.5 mg/dL (ref 8.4–10.5)
Creatinine, Ser: 0.98 mg/dL (ref 0.50–1.35)
GFR calc Af Amer: >90 mL/min (ref >90)

## 2011-06-17 LAB — PHOSPHORUS: Phosphorus: 4.1 mg/dL (ref 2.3–4.6)

## 2011-06-17 LAB — PRO B NATRIURETIC PEPTIDE: Pro B Natriuretic peptide (BNP): 919.3 pg/mL — ABNORMAL HIGH (ref 0–450)

## 2011-06-17 LAB — MAGNESIUM: Magnesium: 2 mg/dL (ref 1.5–2.5)

## 2011-06-17 LAB — CARDIAC PANEL(CRET KIN+CKTOT+MB+TROPI): Relative Index: INVALID (ref 0.0–2.5)

## 2011-06-17 MED ORDER — IPRATROPIUM BROMIDE 0.02 % IN SOLN
0.5000 mg | Freq: Once | RESPIRATORY_TRACT | Status: AC
  Administered 2011-06-17: 0.5 mg via RESPIRATORY_TRACT
  Filled 2011-06-17: qty 2.5

## 2011-06-17 MED ORDER — HYDROCODONE-ACETAMINOPHEN 5-325 MG PO TABS
1.0000 | ORAL_TABLET | ORAL | Status: DC | PRN
  Administered 2011-06-17 – 2011-06-18 (×3): 1 via ORAL
  Filled 2011-06-17 (×3): qty 1

## 2011-06-17 MED ORDER — FLORANEX PO PACK
1.0000 g | PACK | Freq: Three times a day (TID) | ORAL | Status: DC
  Administered 2011-06-18 – 2011-06-20 (×8): 1 g via ORAL
  Filled 2011-06-17 (×13): qty 1

## 2011-06-17 MED ORDER — ADULT MULTIVITAMIN W/MINERALS CH
1.0000 | ORAL_TABLET | Freq: Every day | ORAL | Status: DC
  Administered 2011-06-17 – 2011-06-20 (×4): 1 via ORAL
  Filled 2011-06-17 (×5): qty 1

## 2011-06-17 MED ORDER — ACETAMINOPHEN 650 MG RE SUPP: 650.0000 mg | Freq: Four times a day (QID) | RECTAL | Status: DC | PRN

## 2011-06-17 MED ORDER — SENNA 8.6 MG PO TABS
1.0000 | ORAL_TABLET | Freq: Two times a day (BID) | ORAL | Status: DC
  Administered 2011-06-17 – 2011-06-20 (×6): 8.6 mg via ORAL
  Filled 2011-06-17 (×6): qty 1

## 2011-06-17 MED ORDER — ALBUTEROL SULFATE (5 MG/ML) 0.5% IN NEBU: 2.5000 mg | INHALATION_SOLUTION | RESPIRATORY_TRACT | Status: DC | PRN

## 2011-06-17 MED ORDER — ONDANSETRON HCL 4 MG/2ML IJ SOLN: 4.0000 mg | Freq: Three times a day (TID) | INTRAMUSCULAR | Status: AC | PRN

## 2011-06-17 MED ORDER — VANCOMYCIN HCL IN DEXTROSE 1-5 GM/200ML-% IV SOLN
1000.0000 mg | Freq: Once | INTRAVENOUS | Status: AC
  Administered 2011-06-17: 1000 mg via INTRAVENOUS
  Filled 2011-06-17: qty 200

## 2011-06-17 MED ORDER — ACETAMINOPHEN 325 MG PO TABS: 650.0000 mg | ORAL_TABLET | Freq: Four times a day (QID) | ORAL | Status: DC | PRN

## 2011-06-17 MED ORDER — PIPERACILLIN-TAZOBACTAM 3.375 G IVPB
3.3750 g | Freq: Three times a day (TID) | INTRAVENOUS | Status: DC
  Administered 2011-06-17 – 2011-06-20 (×8): 3.375 g via INTRAVENOUS
  Filled 2011-06-17 (×10): qty 50

## 2011-06-17 MED ORDER — MORPHINE SULFATE 2 MG/ML IJ SOLN: 2.0000 mg | INTRAMUSCULAR | Status: DC | PRN

## 2011-06-17 MED ORDER — ALUM & MAG HYDROXIDE-SIMETH 200-200-20 MG/5ML PO SUSP: 30.0000 mL | Freq: Four times a day (QID) | ORAL | Status: DC | PRN

## 2011-06-17 MED ORDER — SODIUM CHLORIDE 0.9 % IJ SOLN
3.0000 mL | Freq: Two times a day (BID) | INTRAMUSCULAR | Status: DC
  Administered 2011-06-17 – 2011-06-19 (×5): 3 mL via INTRAVENOUS

## 2011-06-17 MED ORDER — HYDROMORPHONE HCL PF 1 MG/ML IJ SOLN: 1.0000 mg | INTRAMUSCULAR | Status: DC | PRN

## 2011-06-17 MED ORDER — IOHEXOL 300 MG/ML  SOLN
100.0000 mL | Freq: Once | INTRAMUSCULAR | Status: AC | PRN
  Administered 2011-06-17: 100 mL via INTRAVENOUS

## 2011-06-17 MED ORDER — ENOXAPARIN SODIUM 40 MG/0.4ML ~~LOC~~ SOLN
40.0000 mg | SUBCUTANEOUS | Status: DC
  Administered 2011-06-17 – 2011-06-19 (×3): 40 mg via SUBCUTANEOUS
  Filled 2011-06-17 (×5): qty 0.4

## 2011-06-17 MED ORDER — HYDRALAZINE HCL 20 MG/ML IJ SOLN
5.0000 mg | Freq: Four times a day (QID) | INTRAMUSCULAR | Status: DC | PRN
  Filled 2011-06-17: qty 0.25

## 2011-06-17 MED ORDER — HYDROCHLOROTHIAZIDE 12.5 MG PO CAPS
12.5000 mg | ORAL_CAPSULE | Freq: Every day | ORAL | Status: DC
  Administered 2011-06-17 – 2011-06-20 (×4): 12.5 mg via ORAL
  Filled 2011-06-17 (×4): qty 1

## 2011-06-17 MED ORDER — NICOTINE 14 MG/24HR TD PT24
14.0000 mg | MEDICATED_PATCH | Freq: Every day | TRANSDERMAL | Status: DC
  Administered 2011-06-18 – 2011-06-20 (×3): 14 mg via TRANSDERMAL
  Filled 2011-06-17 (×4): qty 1

## 2011-06-17 MED ORDER — VANCOMYCIN HCL IN DEXTROSE 1-5 GM/200ML-% IV SOLN
1000.0000 mg | Freq: Two times a day (BID) | INTRAVENOUS | Status: DC
  Administered 2011-06-18 (×3): 1000 mg via INTRAVENOUS
  Filled 2011-06-17 (×5): qty 200

## 2011-06-17 MED ORDER — METHYLPREDNISOLONE SODIUM SUCC 125 MG IJ SOLR
80.0000 mg | Freq: Four times a day (QID) | INTRAMUSCULAR | Status: DC
  Administered 2011-06-17: 80 mg via INTRAVENOUS
  Administered 2011-06-18: 03:00:00 via INTRAVENOUS
  Administered 2011-06-18 (×2): 80 mg via INTRAVENOUS
  Filled 2011-06-17 (×8): qty 1.28

## 2011-06-17 MED ORDER — ALBUTEROL SULFATE (5 MG/ML) 0.5% IN NEBU
2.5000 mg | INHALATION_SOLUTION | Freq: Once | RESPIRATORY_TRACT | Status: AC
  Administered 2011-06-17: 2.5 mg via RESPIRATORY_TRACT
  Filled 2011-06-17: qty 1

## 2011-06-17 MED ORDER — FUROSEMIDE 10 MG/ML IJ SOLN
40.0000 mg | Freq: Once | INTRAMUSCULAR | Status: AC
  Administered 2011-06-17: 40 mg via INTRAVENOUS
  Filled 2011-06-17: qty 4

## 2011-06-17 MED ORDER — ALBUTEROL SULFATE (5 MG/ML) 0.5% IN NEBU
2.5000 mg | INHALATION_SOLUTION | RESPIRATORY_TRACT | Status: DC | PRN
  Filled 2011-06-17: qty 0.5

## 2011-06-17 MED ORDER — PANTOPRAZOLE SODIUM 40 MG PO TBEC
40.0000 mg | DELAYED_RELEASE_TABLET | Freq: Every day | ORAL | Status: DC
  Administered 2011-06-18 – 2011-06-20 (×3): 40 mg via ORAL
  Filled 2011-06-17 (×4): qty 1

## 2011-06-17 MED ORDER — AZITHROMYCIN 500 MG PO TABS
500.0000 mg | ORAL_TABLET | Freq: Every day | ORAL | Status: AC
  Filled 2011-06-17: qty 1

## 2011-06-17 MED ORDER — AZITHROMYCIN 250 MG PO TABS
250.0000 mg | ORAL_TABLET | Freq: Every day | ORAL | Status: DC
  Administered 2011-06-18 – 2011-06-20 (×3): 250 mg via ORAL
  Filled 2011-06-17 (×3): qty 1

## 2011-06-17 MED ORDER — ALPRAZOLAM 0.25 MG PO TABS
0.2500 mg | ORAL_TABLET | Freq: Every evening | ORAL | Status: DC | PRN
  Administered 2011-06-17 – 2011-06-18 (×4): 0.25 mg via ORAL
  Filled 2011-06-17 (×4): qty 1

## 2011-06-17 MED ORDER — PIPERACILLIN-TAZOBACTAM 3.375 G IVPB
3.3750 g | Freq: Once | INTRAVENOUS | Status: AC
  Administered 2011-06-17: 3.375 g via INTRAVENOUS
  Filled 2011-06-17: qty 50

## 2011-06-17 MED ORDER — FUROSEMIDE 10 MG/ML IJ SOLN
40.0000 mg | Freq: Once | INTRAMUSCULAR | Status: DC
  Filled 2011-06-17: qty 4

## 2011-06-17 MED ORDER — ALBUTEROL SULFATE (5 MG/ML) 0.5% IN NEBU
2.5000 mg | INHALATION_SOLUTION | Freq: Four times a day (QID) | RESPIRATORY_TRACT | Status: DC
  Administered 2011-06-17 – 2011-06-19 (×7): 2.5 mg via RESPIRATORY_TRACT
  Filled 2011-06-17 (×9): qty 0.5

## 2011-06-17 MED ORDER — DOCUSATE SODIUM 100 MG PO CAPS
100.0000 mg | ORAL_CAPSULE | Freq: Two times a day (BID) | ORAL | Status: DC
  Administered 2011-06-17 – 2011-06-20 (×6): 100 mg via ORAL
  Filled 2011-06-17 (×7): qty 1

## 2011-06-17 MED ORDER — FUROSEMIDE 10 MG/ML IJ SOLN: 40.0000 mg | Freq: Once | INTRAMUSCULAR | Status: DC

## 2011-06-17 MED ORDER — ONDANSETRON HCL 4 MG PO TABS: 4.0000 mg | ORAL_TABLET | Freq: Four times a day (QID) | ORAL | Status: DC | PRN

## 2011-06-17 MED ORDER — METOPROLOL TARTRATE 12.5 MG HALF TABLET
12.5000 mg | ORAL_TABLET | Freq: Two times a day (BID) | ORAL | Status: DC
  Administered 2011-06-17 – 2011-06-20 (×6): 12.5 mg via ORAL
  Filled 2011-06-17 (×8): qty 1

## 2011-06-17 MED ORDER — GUAIFENESIN ER 600 MG PO TB12
1200.0000 mg | ORAL_TABLET | Freq: Two times a day (BID) | ORAL | Status: DC
  Administered 2011-06-17 – 2011-06-20 (×6): 1200 mg via ORAL
  Filled 2011-06-17 (×8): qty 2

## 2011-06-17 MED ORDER — TIOTROPIUM BROMIDE MONOHYDRATE 18 MCG IN CAPS
18.0000 ug | ORAL_CAPSULE | Freq: Every day | RESPIRATORY_TRACT | Status: DC
  Administered 2011-06-18 – 2011-06-20 (×3): 18 ug via RESPIRATORY_TRACT
  Filled 2011-06-17: qty 5

## 2011-06-17 MED ORDER — ONDANSETRON HCL 4 MG/2ML IJ SOLN: 4.0000 mg | Freq: Four times a day (QID) | INTRAMUSCULAR | Status: DC | PRN

## 2011-06-17 NOTE — Progress Notes (Signed)
ANTIBIOTIC CONSULT NOTE - INITIAL  Pharmacy Consult for Vancomycin/Zosyn Indication: HCAP  Allergies  Allergen Reactions  . Lyrica (Pregabalin) Hives   Patient Measurements: Height: 5\' 10"  (177.8 cm) Weight: 193 lb 12.6 oz (87.9 kg) IBW/kg (Calculated) : 73   Vital Signs: Temp: 97.8 F (36.6 C) (06/08 1600) Temp src: Oral (06/08 1600) BP: 163/84 mmHg (06/08 1600) Pulse Rate: 64  (06/08 1600) Intake/Output from previous day:   Intake/Output from this shift:    Labs:  Basename 06/17/11 0915  WBC 8.0  HGB 13.5  PLT 194  LABCREA --  CREATININE 0.98   Estimated Creatinine Clearance: 71.7 ml/min (by C-G formula based on Cr of 0.98). No results found for this basename: VANCOTROUGH:2,VANCOPEAK:2,VANCORANDOM:2,GENTTROUGH:2,GENTPEAK:2,GENTRANDOM:2,TOBRATROUGH:2,TOBRAPEAK:2,TOBRARND:2,AMIKACINPEAK:2,AMIKACINTROU:2,AMIKACIN:2, in the last 72 hours   Microbiology: Recent Results (from the past 720 hour(s))  SURGICAL PCR SCREEN     Status: Abnormal   Collection Time   06/06/11  1:06 PM      Component Value Range Status Comment   MRSA, PCR NEGATIVE  NEGATIVE  Final    Staphylococcus aureus POSITIVE (*) NEGATIVE  Final     Medical History: Past Medical History  Diagnosis Date  . Prostate enlargement   . History of chicken pox     childhood  . GERD (gastroesophageal reflux disease)   . Arthritis     knee and back  . AAA (abdominal aortic aneurysm)     stress test 09/14/10 EPIC  . Anxiety   . Dysrhythmia     hx of atrial fib post op x 2 days   . COPD (chronic obstructive pulmonary disease)   . Shortness of breath     with exertion   . Recurrent upper respiratory infection (URI)     productive cough- white phlegm- no fever  . Sleep apnea      12/12 sleep study,SEVERE per study  Dr Delton Coombes- states doesnt wear machine on regular basis- setting BiPAP 9/9  . Hypertension     chest x ray 12/12 EPIC- repeated 06/06/11, EKG 11/12 EPIC    Medications:  Prescriptions prior to  admission  Medication Sig Dispense Refill  . ALPRAZolam (XANAX) 0.25 MG tablet Take 0.25 mg by mouth at bedtime as needed.      . cefdinir (OMNICEF) 300 MG capsule Take 1 capsule (300 mg total) by mouth 2 (two) times daily.  10 capsule  0  . HYDROcodone-acetaminophen (NORCO) 5-325 MG per tablet Take 1-2 tablets by mouth every 4 (four) hours as needed for pain.  30 tablet  0  . tiotropium (SPIRIVA HANDIHALER) 18 MCG inhalation capsule Place 1 capsule (18 mcg total) into inhaler and inhale daily.  30 capsule  3   Anti-infectives     Start     Dose/Rate Route Frequency Ordered Stop   06/18/11 1000   azithromycin (ZITHROMAX) tablet 250 mg        250 mg Oral Daily 06/17/11 1449 06/22/11 0959   06/17/11 1500   azithromycin (ZITHROMAX) tablet 500 mg        500 mg Oral Daily 06/17/11 1449 06/18/11 0959   06/17/11 1300   vancomycin (VANCOCIN) IVPB 1000 mg/200 mL premix        1,000 mg 200 mL/hr over 60 Minutes Intravenous  Once 06/17/11 1256 06/17/11 1457   06/17/11 1300   piperacillin-tazobactam (ZOSYN) IVPB 3.375 g        3.375 g 100 mL/hr over 30 Minutes Intravenous  Once 06/17/11 1256 06/17/11 1427  Assessment:  78yo M active smoker with COPD, recently discharged after ventral hernia repair, presents with worsening SOB and productive cough.  Started Vanc and Zosyn in the ER for suspected HCAP.  Goal of Therapy:  Vancomycin trough level 15-20 mcg/ml  Plan:  Zosyn 3.375g IV Q8H infused over 4hrs. Vancomycin 1g IV q12h. Measure Vanc trough at steady state. Follow up renal fxn and culture results.  Charolotte Eke, PharmD, pager [phone]. 06/17/2011,5:33 PM.

## 2011-06-17 NOTE — ED Notes (Signed)
EMS called to home.  Found patient sitting in a chair and tachypnic.  Patient is alert and oriented x3.  He is complaining of shortness of breath that started 4 days ago While he was admitted at [facility] this week.  He was DC with xanax and Vicodin  With no relief of symptoms.

## 2011-06-17 NOTE — ED Provider Notes (Signed)
History     CSN: [id number]  Arrival date & time 06/17/11  0840   First MD Initiated Contact with Patient 06/17/11 [phone]      Chief Complaint  Patient presents with  . Shortness of Breath    (Consider location/radiation/quality/duration/timing/severity/associated sxs/prior treatment) Patient is a 78 y.o. male presenting with shortness of breath. The history is provided by the patient.  Shortness of Breath  Associated symptoms include shortness of breath.  He was discharged from the hospital yesterday after having a ventral hernia repair. He states that he was feeling dyspneic when he went home and is gotten progressively worse. There's been a cough productive of clear to yellow sputum. He denies fever, chills, sweats. He denies chest pain. He did use his albuterol inhaler at home with no relief. Dyspnea is severe. It is worse with any kind of exertion. Of note, he states that he was smoking 2 packs of cigarettes a day but stopped as of his hospital admission.  Past Medical History  Diagnosis Date  . Prostate enlargement   . History of chicken pox     childhood  . GERD (gastroesophageal reflux disease)   . Arthritis     knee and back  . AAA (abdominal aortic aneurysm)     stress test 09/14/10 EPIC  . Anxiety   . Dysrhythmia     hx of atrial fib post op x 2 days   . COPD (chronic obstructive pulmonary disease)   . Shortness of breath     with exertion   . Recurrent upper respiratory infection (URI)     productive cough- white phlegm- no fever  . Sleep apnea      12/12 sleep study,SEVERE per study  Dr Delton Coombes- states doesnt wear machine on regular basis- setting BiPAP 9/9  . Hypertension     chest x ray 12/12 EPIC- repeated 06/06/11, EKG 11/12 EPIC    Past Surgical History  Procedure Date  . Knee surgery 1957    left knee arthotomy  . Abdominal aortic aneurysm repair 11/14/2010    AAA Repair    Dr Arbie Cookey  . Transurethral resection of prostate 01/25/2011    TURP  . Incisional  hernia repair 06/12/2011    Procedure: HERNIA REPAIR INCISIONAL;  Surgeon: Velora Heckler, MD;  Location: [facility]S;  Service: General;  Laterality: N/A;  Open Repair Ventral Incisional Hernia with Mesh    Family History  Problem Relation Age of Onset  . Arthritis Mother   . Heart failure Mother   . Hypertension Mother   . Diabetes Mother   . Heart failure Daughter     deceased age 35  . Other Daughter     deceased age 23 MVA    History  Substance Use Topics  . Smoking status: Current Everyday Smoker -- 2.0 packs/day for 55 years    Types: Cigarettes  . Smokeless tobacco: Never Used  . Alcohol Use: No     no etoh in 28 years       Review of Systems  Respiratory: Positive for shortness of breath.   All other systems reviewed and are negative.    Allergies  Lyrica  Home Medications   Current Outpatient Rx  Name Route Sig Dispense Refill  . ALBUTEROL SULFATE HFA 108 (90 BASE) MCG/ACT IN AERS Inhalation Inhale 2 puffs into the lungs every 4 (four) hours as needed. For shortness of breath    . ALPRAZOLAM 0.25 MG PO TABS Oral Take 0.25 mg by  mouth at bedtime as needed.    Marland Kitchen CEFDINIR 300 MG PO CAPS Oral Take 1 capsule (300 mg total) by mouth 2 (two) times daily. 10 capsule 0  . HYDROCODONE-ACETAMINOPHEN 5-325 MG PO TABS Oral Take 1-2 tablets by mouth every 4 (four) hours as needed for pain. 30 tablet 0  . TIOTROPIUM BROMIDE MONOHYDRATE 18 MCG IN CAPS Inhalation Place 1 capsule (18 mcg total) into inhaler and inhale daily. 30 capsule 3    BP 212/76  Pulse 57  Temp(Src) 97 F (36.1 C) (Oral)  Resp 23  SpO2 96%  Physical Exam  Nursing note and vitals reviewed.  78 year old male who appears dyspneic. Vital signs are significant for mild bradycardia with heart rate of 57, moderate tachypnea with respiratory rate of 23, and severe hypertension with blood pressure 212/76. Oxygen saturation is 96% which is normal. Head is normocephalic and atraumatic. PERRLA, EOMI para thinks is  clear. Neck is nontender and supple. There is no JVD present. Lungs have distant breath sounds with no overt rales, wheezes, or rhonchi. Heart has regular rate and rhythm without murmur. Abdomen: Dressing is present over the abdominal incision and this is not removed. Drain is present with serosanguineous drainage. Extremities have no cyanosis or edema, full range of motion is present. Skin is warm and dry without rash. Neurologic: Mental status is normal, cranial nerves are intact, there are no motor or sensory deficits.  ED Course  Procedures (including critical care time)  Results for orders placed during the hospital encounter of 06/17/11  CBC      Component Value Range   WBC 8.0  4.0 - 10.5 (K/uL)   RBC 4.25  4.22 - 5.81 (MIL/uL)   Hemoglobin 13.5  13.0 - 17.0 (g/dL)   HCT 62.9  52.8 - 41.3 (%)   MCV 93.9  78.0 - 100.0 (fL)   MCH 31.8  26.0 - 34.0 (pg)   MCHC 33.8  30.0 - 36.0 (g/dL)   RDW 24.4  01.0 - 27.2 (%)   Platelets 194  150 - 400 (K/uL)  DIFFERENTIAL      Component Value Range   Neutrophils Relative 73  43 - 77 (%)   Neutro Abs 5.8  1.7 - 7.7 (K/uL)   Lymphocytes Relative 16  12 - 46 (%)   Lymphs Abs 1.2  0.7 - 4.0 (K/uL)   Monocytes Relative 6  3 - 12 (%)   Monocytes Absolute 0.5  0.1 - 1.0 (K/uL)   Eosinophils Relative 5  0 - 5 (%)   Eosinophils Absolute 0.4  0.0 - 0.7 (K/uL)   Basophils Relative 0  0 - 1 (%)   Basophils Absolute 0.0  0.0 - 0.1 (K/uL)  BASIC METABOLIC PANEL      Component Value Range   Sodium 139  135 - 145 (mEq/L)   Potassium 3.8  3.5 - 5.1 (mEq/L)   Chloride 102  96 - 112 (mEq/L)   CO2 26  19 - 32 (mEq/L)   Glucose, Bld 82  70 - 99 (mg/dL)   BUN 15  6 - 23 (mg/dL)   Creatinine, Ser 5.36  0.50 - 1.35 (mg/dL)   Calcium 9.5  8.4 - 64.4 (mg/dL)   GFR calc non Af Amer 78 (*) >90 (mL/min)   GFR calc Af Amer >90  >90 (mL/min)  BLOOD GAS, ARTERIAL      Component Value Range   FIO2 0.28     Delivery systems NASAL CANNULA     pH,  Arterial 7.449   7.350 - 7.450    pCO2 arterial 35.4  35.0 - 45.0 (mmHg)   pO2, Arterial 61.0 (*) 80.0 - 100.0 (mmHg)   Bicarbonate 24.1 (*) 20.0 - 24.0 (mEq/L)   TCO2 20.7  0 - 100 (mmol/L)   Acid-Base Excess 1.0  0.0 - 2.0 (mmol/L)   O2 Saturation 91.5     Patient temperature 98.6     Collection site RIGHT RADIAL     Drawn by [id number]     Sample type ARTERIAL DRAW     Allens test (pass/fail) PASS  PASS   PRO B NATRIURETIC PEPTIDE      Component Value Range   Pro B Natriuretic peptide (BNP) 919.3 (*) 0 - 450 (pg/mL)  TROPONIN I      Component Value Range   Troponin I <0.30  <0.30 (ng/mL)   Dg Chest 2 View  06/06/2011  *RADIOLOGY REPORT*  Clinical Data: Productive cough.  CHEST - 2 VIEW  Comparison: Chest x-ray 12/19/2010.  Findings: Lungs appear hyperexpanded with flattening of the hemidiaphragms, increased retrosternal air space and pruning of the pulmonary vasculature in the periphery, suggestive of underlying COPD.  Compared to the prior examination in the extent of diffuse bronchial wall thickening appears slightly increased.  No focal consolidative airspace disease.  Lateral view demonstrates mild blunting of the costophrenic sulci bilaterally, which could suggest chronic pleural scarring or trace bilateral pleural effusions. Pulmonary vasculature is normal.  Cardiomediastinal silhouette is within normal limits.  Atherosclerotic calcifications within the arch of the aorta.  IMPRESSION: 1.  Findings, as above, concerning for acute bronchitis superimposed upon a background of mild COPD. 2.  Query trace bilateral pleural effusions versus chronic pleural scarring. 3.  Atherosclerosis.  Original Report Authenticated By: Florencia Reasons, M.D.   Ct Angio Chest W/cm &/or Wo Cm  06/17/2011  *RADIOLOGY REPORT*  Clinical Data: Shortness of breath. Tachypnea.  CT ANGIOGRAPHY CHEST  Technique:  Multidetector CT imaging of the chest using the standard protocol during bolus administration of intravenous contrast.  Multiplanar reconstructed images including MIPs were obtained and reviewed to evaluate the vascular anatomy.  Contrast: OMNIPAQUE IOHEXOL 300 MG/ML  SOLN  Comparison: Portable chest 06/17/2011.  Findings: Pulmonary arterial opacification is satisfactory.  The study is mildly degraded by patient breathing motion.  There are no focal filling defects to suggest pulmonary embolus.  Coronary artery calcifications are present.  The heart size is upper limits of normal.  There is no significant pericardial effusion.  No significant mediastinal or axillary adenopathy is present.  A small right pleural effusion is present.  There is associated airspace disease.  Bilateral dependent atelectasis is evident.  Mild paraseptal emphysematous changes are evident at the lung apices bilaterally as well.  Bone windows demonstrate no focal lytic or blastic lesions.  IMPRESSION:  1.  Although the study is mildly degraded by patient breathing motion, there is no evidence for pulmonary embolus. 2.  Atherosclerosis including coronary artery disease. 3.  Dependent airspace disease bilaterally likely represents atelectasis. 4.  Small right pleural effusion. 5.  Paraseptal emphysema.  Original Report Authenticated By: Jamesetta Orleans. MATTERN, M.D.   Dg Chest Portable 1 View  06/17/2011  *RADIOLOGY REPORT*  Clinical Data: Shortness of breath  PORTABLE CHEST - 1 VIEW  Comparison: 06/16/2011  Findings: Cardiomegaly again noted.  Persistent mild interstitial prominence bilaterally probable mild interstitial edema.  No segmental infiltrate.  IMPRESSION: No significant change.  Again noted cardiomegaly.  Mild interstitial prominence bilaterally probable  mild interstitial edema without change in aeration .  Original Report Authenticated By: Natasha Mead, M.D.      Date: 06/17/2011  Rate: 59  Rhythm: sinus bradycardia  QRS Axis: left  Intervals: normal  ST/T Wave abnormalities: nonspecific T wave changes  Conduction Disutrbances:left  bundle branch block  Narrative Interpretation: Sinus bradycardia with a left bundle branch block and nonspecific T wave flattening. When compared with ECG of 01/19/2011, no significant changes are seen.  Old EKG Reviewed: unchanged    1. Dyspnea   2. COPD (chronic obstructive pulmonary disease)   3. CHF (congestive heart failure)       MDM  Dyspnea and a patient with severe COPD. I reviewed his hospital records and he has had respiratory complications following his current surgery and also following abdominal aortic aneurysm repair in November. He has required repeat intubation after being extubated postoperatively. Because of tenuous respiratory status, ABG will be checked as well as a chest x-ray.  He was given an albuterol with Atrovent nebulizer treatment with no significant improvement. Chest x-ray looks more like CHF than COPD or pneumonia. He is given a dose of Lasix. CT angiogram does not show evidence of pulmonary embolism. I. right pleural effusion is present and I do not see any airspace disease that looks like pneumonia. I reviewed the images independently. His ABG does not show evidence of CO2 retention. However, because of his tenuous respiratory status, it is elected to admit him to the hospital. Case is discussed with Dr. Venetia Constable who agrees to admit him.    Dione Booze, MD 06/17/11 1302

## 2011-06-17 NOTE — H&P (Signed)
PCP:   Letitia Libra, Ala Dach, MD, MD   Chief Complaint:  Shortness of breath for 1 week, worse overnight.  HPI: Mr Henshaw was discharged from the hospital yesterday, after ventral hernia repair. After he got home, he got increasingly short of breath, and continued to have cough productive of yellow phlegm. He tried breathing treatments, and going up on his oxygen, without relief, hence presentation to the ED. He denies fever or chills. He sates that he had the cough prior to the previous hospitalization, and it never cleared. He continues to smoke. Denies vomiting or diarrhea or dysuria. In ED he had CT of chest as there was concern for PE. The CT showed "1. Although the study is mildly degraded by patient breathing motion, there is no evidence for pulmonary embolus. 2. Atherosclerosis including coronary artery disease. 3. Dependent airspace disease bilaterally likely represents atelectasis. 4. Small right pleural effusion.  5. Paraseptal emphysema". Patient was hence started on abx and referred for admission. He had been released from the hospital on omnicef.    Review of Systems:  The patient denies anorexia, fever, weight loss,, vision loss, decreased hearing, hoarseness, chest pain, syncope, dyspnea on exertion, peripheral edema, balance deficits, hemoptysis, abdominal pain, melena, hematochezia, severe indigestion/heartburn, hematuria, incontinence, genital sores, muscle weakness, suspicious skin lesions, transient blindness, difficulty walking, depression, unusual weight change, abnormal bleeding, enlarged lymph nodes, angioedema, and breast masses.  Past Medical History: Past Medical History  Diagnosis Date  . Prostate enlargement   . History of chicken pox     childhood  . GERD (gastroesophageal reflux disease)   . Arthritis     knee and back  . AAA (abdominal aortic aneurysm)     stress test 09/14/10 EPIC  . Anxiety   . Dysrhythmia     hx of atrial fib post op x 2 days   . COPD  (chronic obstructive pulmonary disease)   . Shortness of breath     with exertion   . Recurrent upper respiratory infection (URI)     productive cough- white phlegm- no fever  . Sleep apnea      12/12 sleep study,SEVERE per study  Dr Delton Coombes- states doesnt wear machine on regular basis- setting BiPAP 9/9  . Hypertension     chest x ray 12/12 EPIC- repeated 06/06/11, EKG 11/12 EPIC   Past Surgical History  Procedure Date  . Knee surgery 1957    left knee arthotomy  . Abdominal aortic aneurysm repair 11/14/2010    AAA Repair    Dr Arbie Cookey  . Transurethral resection of prostate 01/25/2011    TURP  . Incisional hernia repair 06/12/2011    Procedure: HERNIA REPAIR INCISIONAL;  Surgeon: Velora Heckler, MD;  Location: [facility];  Service: General;  Laterality: N/A;  Open Repair Ventral Incisional Hernia with Mesh    Medications: Prior to Admission medications   Medication Sig Start Date End Date Taking? Authorizing Provider  ALPRAZolam (XANAX) 0.25 MG tablet Take 0.25 mg by mouth at bedtime as needed.   Yes Historical Provider, MD  cefdinir (OMNICEF) 300 MG capsule Take 1 capsule (300 mg total) by mouth 2 (two) times daily. 06/07/11 06/17/11 Yes Tammy S Parrett, NP  HYDROcodone-acetaminophen (NORCO) 5-325 MG per tablet Take 1-2 tablets by mouth every 4 (four) hours as needed for pain. 06/16/11 06/26/11 Yes Velora Heckler, MD  tiotropium (SPIRIVA HANDIHALER) 18 MCG inhalation capsule Place 1 capsule (18 mcg total) into inhaler and inhale daily. 11/22/10 11/22/11 Yes Molly Maduro  Kavin Leech, MD    Allergies:   Allergies  Allergen Reactions  . Lyrica (Pregabalin) Hives    Social History:  reports that he has been smoking Cigarettes.  He has a 110 pack-year smoking history. He has never used smokeless tobacco. He reports that he does not drink alcohol or use illicit drugs. lives with his wife who is currently admitted at [facility].  Family History: Family History  Problem Relation Age of Onset  .  Arthritis Mother   . Heart failure Mother   . Hypertension Mother   . Diabetes Mother   . Heart failure Daughter     deceased age 74  . Other Daughter     deceased age 61 MVA    Physical Exam: Filed Vitals:   06/17/11 0844 06/17/11 1327  BP: 212/76 162/65  Pulse: 57 67  Temp: 97 F (36.1 C) 97.8 F (36.6 C)  TempSrc: Oral Oral  Resp: 23 16  SpO2: 96% 95%   In mild respiratory distress. PERRLA. No jvd. No carotid bruits. Reduced air entry bilaterally. Scant wheezing and rhonchi, mostly in bases. S1S2 heard. No murmurs. RRR. Abdomen- dressing and drains in place. Soft, non tender. +BS. CNS- grossly Intact. No pedal edema. Peripheral pulses equal.   Labs on Admission:   [facility] 06/17/11 0915  NA 139  K 3.8  CL 102  CO2 26  GLUCOSE 82  BUN 15  CREATININE 0.98  CALCIUM 9.5  MG --  PHOS --   No results found for this basename: AST:2,ALT:2,ALKPHOS:2,BILITOT:2,PROT:2,ALBUMIN:2 in the last 72 hours No results found for this basename: LIPASE:2,AMYLASE:2 in the last 72 hours  Basename 06/17/11 0915  WBC 8.0  NEUTROABS 5.8  HGB 13.5  HCT 39.9  MCV 93.9  PLT 194    Basename 06/17/11 1019  CKTOTAL --  CKMB --  CKMBINDEX --  TROPONINI <0.30   No results found for this basename: TSH,T4TOTAL,FREET3,T3FREE,THYROIDAB in the last 72 hours No results found for this basename: VITAMINB12:2,FOLATE:2,FERRITIN:2,TIBC:2,IRON:2,RETICCTPCT:2 in the last 72 hours  Radiological Exams on Admission: Dg Chest 2 View  06/06/2011  *RADIOLOGY REPORT*  Clinical Data: Productive cough.  CHEST - 2 VIEW  Comparison: Chest x-ray 12/19/2010.  Findings: Lungs appear hyperexpanded with flattening of the hemidiaphragms, increased retrosternal air space and pruning of the pulmonary vasculature in the periphery, suggestive of underlying COPD.  Compared to the prior examination in the extent of diffuse bronchial wall thickening appears slightly increased.  No focal consolidative airspace disease.   Lateral view demonstrates mild blunting of the costophrenic sulci bilaterally, which could suggest chronic pleural scarring or trace bilateral pleural effusions. Pulmonary vasculature is normal.  Cardiomediastinal silhouette is within normal limits.  Atherosclerotic calcifications within the arch of the aorta.  IMPRESSION: 1.  Findings, as above, concerning for acute bronchitis superimposed upon a background of mild COPD. 2.  Query trace bilateral pleural effusions versus chronic pleural scarring. 3.  Atherosclerosis.  Original Report Authenticated By: Florencia Reasons, M.D.   Ct Angio Chest W/cm &/or Wo Cm  06/17/2011  *RADIOLOGY REPORT*  Clinical Data: Shortness of breath. Tachypnea.  CT ANGIOGRAPHY CHEST  Technique:  Multidetector CT imaging of the chest using the standard protocol during bolus administration of intravenous contrast. Multiplanar reconstructed images including MIPs were obtained and reviewed to evaluate the vascular anatomy.  Contrast: OMNIPAQUE IOHEXOL 300 MG/ML  SOLN  Comparison: Portable chest 06/17/2011.  Findings: Pulmonary arterial opacification is satisfactory.  The study is mildly degraded by patient breathing motion.  There are no focal filling defects to suggest pulmonary embolus.  Coronary artery calcifications are present.  The heart size is upper limits of normal.  There is no significant pericardial effusion.  No significant mediastinal or axillary adenopathy is present.  A small right pleural effusion is present.  There is associated airspace disease.  Bilateral dependent atelectasis is evident.  Mild paraseptal emphysematous changes are evident at the lung apices bilaterally as well.  Bone windows demonstrate no focal lytic or blastic lesions.  IMPRESSION:  1.  Although the study is mildly degraded by patient breathing motion, there is no evidence for pulmonary embolus. 2.  Atherosclerosis including coronary artery disease. 3.  Dependent airspace disease bilaterally  likely represents atelectasis. 4.  Small right pleural effusion. 5.  Paraseptal emphysema.  Original Report Authenticated By: Jamesetta Orleans. MATTERN, M.D.   Dg Chest Portable 1 View  06/17/2011  *RADIOLOGY REPORT*  Clinical Data: Shortness of breath  PORTABLE CHEST - 1 VIEW  Comparison: 06/16/2011  Findings: Cardiomegaly again noted.  Persistent mild interstitial prominence bilaterally probable mild interstitial edema.  No segmental infiltrate.  IMPRESSION: No significant change.  Again noted cardiomegaly.  Mild interstitial prominence bilaterally probable mild interstitial edema without change in aeration .  Original Report Authenticated By: Natasha Mead, M.D.   Dg Chest Port 1 View  06/16/2011  *RADIOLOGY REPORT*  Clinical Data: Shortness of breath.  PORTABLE CHEST - 1 VIEW  Comparison: 06/14/2011.  Findings: The heart is enlarged but stable.  There is slight worsening aeration with developing interstitial edema.  Persistent areas of atelectasis.  No definite effusions.  IMPRESSION: Worsening lung aeration with probable interstitial pulmonary edema.  Original Report Authenticated By: P. Loralie Champagne, M.D.   Dg Chest Port 1 View  06/14/2011  *RADIOLOGY REPORT*  Clinical Data: COPD and hypertension.  PORTABLE CHEST - 1 VIEW  Comparison: 06/13/2011.  Findings: The heart remains mildly enlarged.  Persistent low lung volumes with vascular crowding and bibasilar atelectasis.  Small pleural effusions are suspected.  IMPRESSION: Persistent low lung volumes with vascular crowding and bibasilar atelectasis.  Original Report Authenticated By: P. Loralie Champagne, M.D.   Dg Chest Port 1 View  06/13/2011  *RADIOLOGY REPORT*  Clinical Data: COPD  PORTABLE CHEST - 1 VIEW  Comparison: 06/06/2011  Findings: The heart size appears mildly enlarged.  The lung volumes are low.  Bilateral lower lobe atelectasis is noted.  Mild interstitial edema is present.  IMPRESSION:  1.  Low lung volumes and bibasilar atelectasis. 2.  Cardiac  enlargement and mild edema.  Original Report Authenticated By: Rosealee Albee, M.D.    Assessment  77 year old male active smoker with COPD, who recently had ventral hernia repair, who now presents with worsening SOB and cough with suggestion of HCAP with right sided pleural effusion. His BNP is less than 1000.  Plan   .SOB (shortness of breath)/HCAP (healthcare-associated pneumonia)/COPD exacerbation/Tobacco abuse- admit tele. Vanc/zosyn/zithromax/short course of steroids/bronchodilators/O2 supplementation as necessary/mucinex. Obtain 2decho/cardiac enzymes) .HTN (hypertension), malignant- poorly controlled( beta blocker as hx of aortic aneurysm/diuretic/hydralazine prn. .Paroxysmal a-fib- rate controlled. Will be on beta blocker. Not anticoagulated, ?reason. .Ventral hernia- will consult [facility] in am. Ask [facility] to follow.. Dvt/gi prophylaxis. Condition closely guarded, discussed plan of care with daughter at bed side. May need snf.    Naesha Buckalew [phone]. 06/17/2011, 3:10 PM

## 2011-06-18 DIAGNOSIS — I4891 Unspecified atrial fibrillation: Secondary | ICD-10-CM

## 2011-06-18 DIAGNOSIS — J438 Other emphysema: Secondary | ICD-10-CM

## 2011-06-18 DIAGNOSIS — K439 Ventral hernia without obstruction or gangrene: Secondary | ICD-10-CM

## 2011-06-18 DIAGNOSIS — J189 Pneumonia, unspecified organism: Secondary | ICD-10-CM

## 2011-06-18 DIAGNOSIS — I10 Essential (primary) hypertension: Secondary | ICD-10-CM

## 2011-06-18 LAB — CARDIAC PANEL(CRET KIN+CKTOT+MB+TROPI)
CK, MB: 2.5 ng/mL (ref 0.3–4.0)
Relative Index: 2.3 (ref 0.0–2.5)
Total CK: 108 U/L (ref 7–232)
Total CK: 81 U/L (ref 7–232)
Troponin I: <0.3 ng/mL (ref <0.30)

## 2011-06-18 LAB — COMPREHENSIVE METABOLIC PANEL
ALT: 36 U/L (ref 0–53)
Alkaline Phosphatase: 149 U/L — ABNORMAL HIGH (ref 39–117)
BUN: 20 mg/dL (ref 6–23)
CO2: 25 mEq/L (ref 19–32)
Chloride: 96 mEq/L (ref 96–112)
GFR calc Af Amer: 77 mL/min — ABNORMAL LOW (ref >90)
GFR calc non Af Amer: 66 mL/min — ABNORMAL LOW (ref >90)
Glucose, Bld: 159 mg/dL — ABNORMAL HIGH (ref 70–99)
Potassium: 3.8 mEq/L (ref 3.5–5.1)
Sodium: 135 mEq/L (ref 135–145)
Total Bilirubin: 1.1 mg/dL (ref 0.3–1.2)

## 2011-06-18 LAB — CBC
HCT: 41.9 % (ref 39.0–52.0)
MCHC: 34.1 g/dL (ref 30.0–36.0)
Platelets: 231 10*3/uL (ref 150–400)
RDW: 13.7 % (ref 11.5–15.5)

## 2011-06-18 LAB — APTT: aPTT: 33 seconds (ref 24–37)

## 2011-06-18 MED ORDER — METHYLPREDNISOLONE SODIUM SUCC 40 MG IJ SOLR
40.0000 mg | Freq: Four times a day (QID) | INTRAMUSCULAR | Status: DC
  Administered 2011-06-18 – 2011-06-19 (×4): 40 mg via INTRAVENOUS
  Filled 2011-06-18 (×7): qty 1

## 2011-06-18 NOTE — Progress Notes (Signed)
SUBJECTIVE Feels better today. Wonders if he can use a CPAP machine while here. He states that he had issues with the mask when he went home.   1. Dyspnea   2. COPD (chronic obstructive pulmonary disease)   3. CHF (congestive heart failure)     Past Medical History  Diagnosis Date  . Prostate enlargement   . History of chicken pox     childhood  . GERD (gastroesophageal reflux disease)   . Arthritis     knee and back  . AAA (abdominal aortic aneurysm)     stress test 09/14/10 EPIC  . Anxiety   . Dysrhythmia     hx of atrial fib post op x 2 days   . COPD (chronic obstructive pulmonary disease)   . Shortness of breath     with exertion   . Recurrent upper respiratory infection (URI)     productive cough- white phlegm- no fever  . Sleep apnea      12/12 sleep study,SEVERE per study  Dr Delton Coombes- states doesnt wear machine on regular basis- setting BiPAP 9/9  . Hypertension     chest x ray 12/12 EPIC- repeated 06/06/11, EKG 11/12 EPIC   Current Facility-Administered Medications  Medication Dose Route Frequency Provider Last Rate Last Dose  . acetaminophen (TYLENOL) tablet 650 mg  650 mg Oral Q6H PRN Jashay Roddy, MD       Or  . acetaminophen (TYLENOL) suppository 650 mg  650 mg Rectal Q6H PRN Shateka Petrea, MD      . albuterol (PROVENTIL) (5 MG/ML) 0.5% nebulizer solution 2.5 mg  2.5 mg Nebulization Q2H PRN Kaydin Karbowski, MD      . albuterol (PROVENTIL) (5 MG/ML) 0.5% nebulizer solution 2.5 mg  2.5 mg Nebulization Q6H Deyvi Bonanno, MD   2.5 mg at 06/18/11 1434  . ALPRAZolam (XANAX) tablet 0.25 mg  0.25 mg Oral QHS PRN Nataya Bastedo, MD   0.25 mg at 06/18/11 1019  . alum & mag hydroxide-simeth (MAALOX/MYLANTA) 200-200-20 MG/5ML suspension 30 mL  30 mL Oral Q6H PRN Maysoon Lozada, MD      . azithromycin (ZITHROMAX) tablet 500 mg  500 mg Oral Daily Sherrye Puga, MD       Followed by  . azithromycin (ZITHROMAX) tablet 250 mg  250 mg Oral Daily Jaslene Marsteller, MD   250 mg at 06/18/11  1017  . docusate sodium (COLACE) capsule 100 mg  100 mg Oral BID Wrenn Willcox, MD   100 mg at 06/18/11 1019  . enoxaparin (LOVENOX) injection 40 mg  40 mg Subcutaneous Q24H Jones Viviani, MD   40 mg at 06/18/11 1714  . furosemide (LASIX) injection 40 mg  40 mg Intravenous Once Jakub Debold, MD      . guaiFENesin (MUCINEX) 12 hr tablet 1,200 mg  1,200 mg Oral BID Dakin Madani, MD   1,200 mg at 06/18/11 1017  . hydrALAZINE (APRESOLINE) injection 5 mg  5 mg Intravenous Q6H PRN Leldon Steege, MD      . hydrochlorothiazide (MICROZIDE) capsule 12.5 mg  12.5 mg Oral Daily Estefan Pattison, MD   12.5 mg at 06/18/11 1019  . HYDROcodone-acetaminophen (NORCO) 5-325 MG per tablet 1-2 tablet  1-2 tablet Oral Q4H PRN Conley Canal, MD   1 tablet at 06/18/11 1018  . lactobacillus (FLORANEX/LACTINEX) granules 1 g  1 g Oral TID WC  Minter, MD   1 g at 06/18/11 1713  . methylPREDNISolone sodium succinate (SOLU-MEDROL) 125 mg/2 mL injection 80 mg  80  mg Intravenous Q6H Kerryn Tennant, MD   80 mg at 06/18/11 1555  . metoprolol tartrate (LOPRESSOR) tablet 12.5 mg  12.5 mg Oral BID Keilany Burnette, MD   12.5 mg at 06/18/11 1018  . morphine 2 MG/ML injection 2 mg  2 mg Intravenous Q4H PRN Bobbie Virden, MD      . multivitamin with minerals tablet 1 tablet  1 tablet Oral Daily Graydon Fofana, MD   1 tablet at 06/18/11 1019  . nicotine (NICODERM CQ - dosed in mg/24 hours) patch 14 mg  14 mg Transdermal Daily Manami Tutor, MD   14 mg at 06/18/11 1014  . ondansetron (ZOFRAN) injection 4 mg  4 mg Intravenous Q8H PRN Dione Booze, MD      . ondansetron [facility]) tablet 4 mg  4 mg Oral Q6H PRN Sheily Lineman, MD       Or  . ondansetron (ZOFRAN) injection 4 mg  4 mg Intravenous Q6H PRN Dorean Hiebert, MD      . pantoprazole (PROTONIX) EC tablet 40 mg  40 mg Oral Q1200 Wasil Wolke, MD   40 mg at 06/18/11 1255  . piperacillin-tazobactam (ZOSYN) IVPB 3.375 g  3.375 g Intravenous Q8H Anasia Agro, MD   3.375 g at 06/18/11 1432    . senna (SENOKOT) tablet 8.6 mg  1 tablet Oral BID Lauralyn Shadowens, MD   8.6 mg at 06/18/11 1019  . sodium chloride 0.9 % injection 3 mL  3 mL Intravenous Q12H Benedict Kue, MD   3 mL at 06/18/11 1015  . tiotropium (SPIRIVA) inhalation capsule 18 mcg  18 mcg Inhalation Daily Karynn Deblasi, MD   18 mcg at 06/18/11 0917  . vancomycin (VANCOCIN) IVPB 1000 mg/200 mL premix  1,000 mg Intravenous Q12H Ibraheem Voris, MD   1,000 mg at 06/18/11 1255   Allergies  Allergen Reactions  . Lyrica (Pregabalin) Hives   Active Problems:  SOB (shortness of breath)  HCAP (healthcare-associated pneumonia)  COPD exacerbation  HTN (hypertension), malignant  H/O ventral hernia repair  Tobacco abuse  Paroxysmal a-fib  Aortic stenosis, severe  UTI (lower urinary tract infection)   Vital signs in last 24 hours: Temp:  [97.3 F (36.3 C)-97.7 F (36.5 C)] 97.3 F (36.3 C) (06/09 1400) Pulse Rate:  [59-70] 59  (06/09 1400) Resp:  [18] 18  (06/09 1400) BP: (154-161)/(72-83) 154/83 mmHg (06/09 1400) SpO2:  [92 %-97 %] 97 % (06/09 1435) Weight:  [87.9 kg (193 lb 12.6 oz)] 87.9 kg (193 lb 12.6 oz) (06/09 0405) Weight change:  Last BM Date: 06/11/11  Intake/Output from previous day: 06/08 0701 - 06/09 0700 In: 250 [IV Piggyback:250] Out: 585 [Urine:525; Drains:60] Intake/Output this shift: Total I/O In: -  Out: 425 [Urine:425]  Lab Results:  [facility] 06/18/11 0525 06/17/11 0915  WBC 7.6 8.0  HGB 14.3 13.5  HCT 41.9 39.9  PLT 231 194   BMET  Basename 06/18/11 0525 06/17/11 0915  NA 135 139  K 3.8 3.8  CL 96 102  CO2 25 26  GLUCOSE 159* 82  BUN 20 15  CREATININE 1.06 0.98  CALCIUM 9.8 9.5    Studies/Results: Ct Angio Chest W/cm &/or Wo Cm  06/17/2011  *RADIOLOGY REPORT*  Clinical Data: Shortness of breath. Tachypnea.  CT ANGIOGRAPHY CHEST  Technique:  Multidetector CT imaging of the chest using the standard protocol during bolus administration of intravenous contrast. Multiplanar  reconstructed images including MIPs were obtained and reviewed to evaluate the vascular anatomy.  Contrast: OMNIPAQUE IOHEXOL  300 MG/ML  SOLN  Comparison: Portable chest 06/17/2011.  Findings: Pulmonary arterial opacification is satisfactory.  The study is mildly degraded by patient breathing motion.  There are no focal filling defects to suggest pulmonary embolus.  Coronary artery calcifications are present.  The heart size is upper limits of normal.  There is no significant pericardial effusion.  No significant mediastinal or axillary adenopathy is present.  A small right pleural effusion is present.  There is associated airspace disease.  Bilateral dependent atelectasis is evident.  Mild paraseptal emphysematous changes are evident at the lung apices bilaterally as well.  Bone windows demonstrate no focal lytic or blastic lesions.  IMPRESSION:  1.  Although the study is mildly degraded by patient breathing motion, there is no evidence for pulmonary embolus. 2.  Atherosclerosis including coronary artery disease. 3.  Dependent airspace disease bilaterally likely represents atelectasis. 4.  Small right pleural effusion. 5.  Paraseptal emphysema.  Original Report Authenticated By: Jamesetta Orleans. MATTERN, M.D.   Dg Chest Portable 1 View  06/17/2011  *RADIOLOGY REPORT*  Clinical Data: Shortness of breath  PORTABLE CHEST - 1 VIEW  Comparison: 06/16/2011  Findings: Cardiomegaly again noted.  Persistent mild interstitial prominence bilaterally probable mild interstitial edema.  No segmental infiltrate.  IMPRESSION: No significant change.  Again noted cardiomegaly.  Mild interstitial prominence bilaterally probable mild interstitial edema without change in aeration .  Original Report Authenticated By: Natasha Mead, M.D.    Medications: I have reviewed the patient's current medications.   Physical exam GENERAL- alert HEAD- normal atraumatic, no neck masses, normal thyroid, no jvd RESPIRATORY- appears well,  vitals normal, no respiratory distress, acyanotic, normal RR, ear and throat exam is normal, neck free of mass or lymphadenopathy, chest clear, no wheezing, crepitations, rhonchi, normal symmetric air entry CVS- regular rate and rhythm, S1, S2 normal, no murmur, click, rub or gallop ABDOMEN-drains in place. Abdomen soft, non tender. +BS. NEURO- Grossly normal EXTREMITIES- extremities normal, atraumatic, no cyanosis or edema  Plan   .SOB (shortness of breath)/HCAP (healthcare-associated pneumonia)/COPD exacerbation/Tobacco abuse- Clinically better. More rested. Vanc/zosyn/zithromax/short course of steroids/bronchodilators/O2 supplementation as necessary/mucinex. Resume CPAP when sleeping. Marland KitchenHTN (hypertension), malignant- better controlled( Continue beta blocker as hx of aortic aneurysm)/diuretic/hydralazine prn. .Paroxysmal a-fib- rate controlled on beta blocker. Not anticoagulated, ?reason.  .Ventral hernia- Seems ok,will notify [facility] of patient's admission in am. Ask [facility] to follow.  Dvt/gi prophylaxis.  Condition closely guarded, discussed plan of care with son at bed side. May need snf.        Okie Jansson 06/18/2011 6:35 PM Pager: [phone].

## 2011-06-18 NOTE — Progress Notes (Signed)
  Echocardiogram 2D Echocardiogram has been performed.  Steve [name] [facility] 06/18/2011, 9:56 AM

## 2011-06-19 DIAGNOSIS — J189 Pneumonia, unspecified organism: Secondary | ICD-10-CM

## 2011-06-19 DIAGNOSIS — J438 Other emphysema: Secondary | ICD-10-CM

## 2011-06-19 DIAGNOSIS — I10 Essential (primary) hypertension: Secondary | ICD-10-CM

## 2011-06-19 DIAGNOSIS — K439 Ventral hernia without obstruction or gangrene: Secondary | ICD-10-CM

## 2011-06-19 LAB — CBC
HCT: 40.3 % (ref 39.0–52.0)
MCH: 32 pg (ref 26.0–34.0)
MCV: 92.6 fL (ref 78.0–100.0)
Platelets: 234 10*3/uL (ref 150–400)
RBC: 4.35 MIL/uL (ref 4.22–5.81)
WBC: 15.8 10*3/uL — ABNORMAL HIGH (ref 4.0–10.5)

## 2011-06-19 LAB — URINE CULTURE

## 2011-06-19 LAB — COMPREHENSIVE METABOLIC PANEL
BUN: 32 mg/dL — ABNORMAL HIGH (ref 6–23)
CO2: 25 mEq/L (ref 19–32)
Calcium: 9.6 mg/dL (ref 8.4–10.5)
Chloride: 96 mEq/L (ref 96–112)
Creatinine, Ser: 1.35 mg/dL (ref 0.50–1.35)
GFR calc Af Amer: 57 mL/min — ABNORMAL LOW (ref >90)
GFR calc non Af Amer: 49 mL/min — ABNORMAL LOW (ref >90)
Glucose, Bld: 160 mg/dL — ABNORMAL HIGH (ref 70–99)
Total Bilirubin: 0.4 mg/dL (ref 0.3–1.2)

## 2011-06-19 LAB — VANCOMYCIN, TROUGH: Vancomycin Tr: 20.9 ug/mL — ABNORMAL HIGH (ref 10.0–20.0)

## 2011-06-19 MED ORDER — VANCOMYCIN HCL 1000 MG IV SOLR
750.0000 mg | Freq: Two times a day (BID) | INTRAVENOUS | Status: DC
  Administered 2011-06-19 – 2011-06-20 (×3): 750 mg via INTRAVENOUS
  Filled 2011-06-19 (×3): qty 750

## 2011-06-19 MED ORDER — ALPRAZOLAM 0.25 MG PO TABS
0.2500 mg | ORAL_TABLET | Freq: Two times a day (BID) | ORAL | Status: DC | PRN
  Administered 2011-06-19 (×2): 0.25 mg via ORAL
  Filled 2011-06-19 (×2): qty 1

## 2011-06-19 MED ORDER — ALBUTEROL SULFATE (5 MG/ML) 0.5% IN NEBU
2.5000 mg | INHALATION_SOLUTION | Freq: Two times a day (BID) | RESPIRATORY_TRACT | Status: DC
  Administered 2011-06-19 – 2011-06-20 (×2): 2.5 mg via RESPIRATORY_TRACT
  Filled 2011-06-19: qty 0.5

## 2011-06-19 MED ORDER — PREDNISONE 20 MG PO TABS
40.0000 mg | ORAL_TABLET | Freq: Every day | ORAL | Status: DC
  Administered 2011-06-19 – 2011-06-20 (×2): 40 mg via ORAL
  Filled 2011-06-19 (×3): qty 2

## 2011-06-19 NOTE — Evaluation (Signed)
Physical Therapy Evaluation Patient Details Name: Steve [name] MRN: [id number] DOB: 1934/01/04 Today's Date: 06/19/2011 Time: 7829-5621 PT Time Calculation (min): 23 min  PT Assessment / Plan / Recommendation Clinical Impression  78 y.o. male admitted with HCAP after recent admission for hernia repaiir.  Pt was independent with mobility at baseline, now has generalized weakness and miildly unsteady gait. Pt would benefit from acute PT to maximize safety and independence with mobility.     PT Assessment  Patient needs continued PT services    Follow Up Recommendations  Home health PT    Barriers to Discharge None      lEquipment Recommendations  None recommended by PT    Recommendations for Other Services     Frequency Min 3X/week    Precautions / Restrictions Precautions Precautions: None Restrictions Weight Bearing Restrictions: No   Pertinent Vitals/Pain *0/10 pain**      Mobility  Bed Mobility Bed Mobility: Supine to Sit Supine to Sit: HOB elevated;5: Supervision Details for Bed Mobility Assistance: HOB 45* Transfers Transfers: Sit to Stand;Stand to Sit Sit to Stand: 6: Modified independent (Device/Increase time);From bed;With upper extremity assist Stand to Sit: 6: Modified independent (Device/Increase time);With armrests;With upper extremity assist Ambulation/Gait Ambulation/Gait Assistance: 4: Min guard Ambulation Distance (Feet): 200 Feet Assistive device: Rolling walker Ambulation/Gait Assistance Details: mild LOB x1 requiring min A Gait Pattern: Within Functional Limits    Exercises     PT Diagnosis: Generalized weakness  PT Problem List: Decreased knowledge of use of DME;Decreased balance;Decreased activity tolerance PT Treatment Interventions: DME instruction;Gait training;Balance training;Patient/family education;Therapeutic activities;Functional mobility training   PT Goals Acute Rehab PT Goals PT Goal Formulation: With patient Time For Goal  Achievement: 06/26/11 Potential to Achieve Goals: Good Pt will go Supine/Side to Sit: Independently;with HOB 0 degrees PT Goal: Supine/Side to Sit - Progress: Goal set today Pt will go Sit to Supine/Side: Independently;with HOB 0 degrees PT Goal: Sit to Supine/Side - Progress: Goal set today Pt will Ambulate: >150 feet;with modified independence;with least restrictive assistive device PT Goal: Ambulate - Progress: Goal set today  Visit Information  Last PT Received On: 06/19/11 Assistance Needed: +1    Subjective Data  Subjective: Steve [name] is in the hospital too with kidney problems. 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    Cognition  Overall Cognitive Status: Appears within functional limits for tasks assessed/performed Arousal/Alertness: Awake/alert Orientation Level: Appears intact for tasks assessed Behavior During Session: [facility] for tasks performed    Extremity/Trunk Assessment Right Upper Extremity Assessment RUE ROM/Strength/Tone: [facility] for tasks assessed Left Upper Extremity Assessment LUE ROM/Strength/Tone: WFL for tasks assessed Right Lower Extremity Assessment RLE ROM/Strength/Tone: Within functional levels RLE Sensation: WFL - Light Touch RLE Coordination: WFL - gross/fine motor Left Lower Extremity Assessment LLE ROM/Strength/Tone: Within functional levels LLE Sensation: WFL - Light Touch LLE Coordination: WFL - gross/fine motor Trunk Assessment Trunk Assessment: Normal   Balance    End of Session PT - End of Session Activity  Tolerance: Patient tolerated treatment well Patient left: in chair Nurse Communication: Mobility status   Ralene Bathe Kistler 06/19/2011, 11:17 AM [phone]

## 2011-06-19 NOTE — Progress Notes (Signed)
Patient ID: Steve [name], male   DOB: [birth year], 78 y.o.   MRN: [id number]  General Surgery - [facility] - Progress Note  POD# 7  Subjective: Patient up in chair.  No dyspnea.  Improving.  Objective: Vital signs in last 24 hours: Temp:  [97.3 F (36.3 C)-97.4 F (36.3 C)] 97.4 F (36.3 C) (06/10 1409) Pulse Rate:  [59-70] 59  (06/10 1409) Resp:  [16-19] 16  (06/10 1409) BP: (124-162)/(61-83) 146/69 mmHg (06/10 1409) SpO2:  [92 %-95 %] 95 % (06/10 1409) Weight:  [195 lb 12.8 oz (88.814 kg)] 195 lb 12.8 oz (88.814 kg) (06/10 0540) Last BM Date: 06/11/11  Intake/Output from previous day: 06/09 0701 - 06/10 0700 In: 317 [IV Piggyback:300] Out: 437 [Urine:425; Drains:12]  Exam: HEENT - clear, not icteric Neck - soft Chest - coarse bilaterally Cor - RRR, no murmur Abd - mild distension; BS present; midline wound clear and dry; left JP drain repaired; right drain with serosanguinous output Ext - no significant edema Neuro - grossly intact, no focal deficits  Lab Results:   Basename 06/19/11 0428 06/18/11 0525  WBC 15.8* 7.6  HGB 13.9 14.3  HCT 40.3 41.9  PLT 234 231     Basename 06/19/11 0428 06/18/11 0525  NA 134* 135  K 3.9 3.8  CL 96 96  CO2 25 25  GLUCOSE 160* 159*  BUN 32* 20  CREATININE 1.35 1.06  CALCIUM 9.6 9.8    Studies/Results: No results found.  Assessment / Plan: 1.  Status post VH repair with mesh  - monitor drain output - will remove soon  - abdominal binder when OOB  2.  COPD / Resp failure  - per medical service  Velora Heckler, MD, [facility] Office: [phone]  06/19/2011

## 2011-06-19 NOTE — Consult Note (Addendum)
WOC consult Note Reason for Consult: Patient is s/p hernia repair by Dr. [name] last week.  Dr. Venetia Constable request WOC and CCS consults on same day. Patient was discharged on 6/7 and readmitted on Saturday, 6/8.  I will defer to the management suggestions of the CCS MD and not see today.  Wound type: Surgical I will not follow.  Please re-consult if needed. Thanks, Ladona Mow, MSN, RN, [facility], CWOCN [phone])

## 2011-06-19 NOTE — Progress Notes (Addendum)
ANTIBIOTIC CONSULT NOTE - FOLLOW UP  Pharmacy Consult for Vancomycin Indication: pneumonia  Allergies  Allergen Reactions  . Lyrica (Pregabalin) Hives    Patient Measurements: Height: 5\' 10"  (177.8 cm) Weight: 195 lb 12.8 oz (88.814 kg) IBW/kg (Calculated) : 73   Vital Signs: Temp: 97.4 F (36.3 C) (06/10 0751) Temp src: Oral (06/10 0751) BP: 124/68 mmHg (06/10 1135) Pulse Rate: 66  (06/10 1135) Intake/Output from previous day: 06/09 0701 - 06/10 0700 In: 317 [IV Piggyback:300] Out: 437 [Urine:425; Drains:12] Intake/Output from this shift: Total I/O In: 243 [P.O.:240; I.V.:3] Out: 350 [Urine:350]  Labs:  [facility] 06/19/11 0428 06/18/11 0525 06/17/11 0915  WBC 15.8* 7.6 8.0  HGB 13.9 14.3 13.5  PLT 234 231 194  LABCREA -- -- --  CREATININE 1.35 1.06 0.98   Estimated Creatinine Clearance: 52.2 ml/min (by C-G formula based on Cr of 1.35).  Basename 06/19/11 1225  VANCOTROUGH 20.9*  VANCOPEAK --  Drue Dun --  GENTTROUGH --  GENTPEAK --  GENTRANDOM --  TOBRATROUGH --  TOBRAPEAK --  TOBRARND --  AMIKACINPEAK --  AMIKACINTROU --  AMIKACIN --     Microbiology: Recent Results (from the past 720 hour(s))  SURGICAL PCR SCREEN     Status: Abnormal   Collection Time   06/06/11  1:06 PM      Component Value Range Status Comment   MRSA, PCR NEGATIVE  NEGATIVE  Final    Staphylococcus aureus POSITIVE (*) NEGATIVE  Final     Assessment: 78 YOM on day #3 vancomycin and Zosyn for suspected HCAP.  SCr trending up so ordered a vancomycin level.  Vanc level returned slightly above goal at 20.9.  Due to potential for continued worsening of SCr and accumulation, will reduce vancomycin dose today.  Currently, Vanc dosing is 1g IV q12h.  CrCl(CG)~ 52 ml/min, CrCl(N)~ 47 ml/min.    Goal of Therapy:  Vancomycin trough level 15-20 mcg/ml  Plan:  Reduce vancomycin to 750 mg IV q12h. Continue Zosyn 3.375g IV q8h (4 hour infusion time). F/u SCr.  Clance Boll 06/19/2011,1:10 PM

## 2011-06-19 NOTE — Progress Notes (Signed)
   CARE MANAGEMENT NOTE 06/19/2011  Patient:  Steve [name], Steve [name]   Account Number:  [id number]  Date Initiated:  06/19/2011  Documentation initiated by:  Jiles Crocker  Subjective/Objective Assessment:   ADMITTED WITH SOB/ COPD/ PNEUMONIA     Action/Plan:   PCP: Estill Cotta, MD, MD;  LIVES AT HOME BUT SPOUSE IS HOSPITALIZED AT PRESENT; POSIBLE NEEDS SNF AT DISCHARGE; AWAITING ON PT/OT EVALS FOR DISPOSITION.   Anticipated DC Date:  06/26/2011   Anticipated DC Plan:  SKILLED NURSING FACILITY  In-house referral  Clinical Social Worker      DC Planning Services  CM consult              Status of service:  In process, will continue to follow Medicare Important Message given?  NA - LOS <3 / Initial given by admissions (If response is "NO", the following Medicare IM given date fields will be blank)  Per UR Regulation:  Reviewed for med. necessity/level of care/duration of stay  Comments:  06/19/2011- B Vaishnavi Dalby RN, BSN, MHA

## 2011-06-19 NOTE — Progress Notes (Signed)
Appreciate CCS, WOC.  SUBJECTIVE Feels better. Less dyspneic. Wants to go home.   1. Dyspnea   2. COPD (chronic obstructive pulmonary disease)   3. CHF (congestive heart failure)     Past Medical History  Diagnosis Date  . Prostate enlargement   . History of chicken pox     childhood  . GERD (gastroesophageal reflux disease)   . Arthritis     knee and back  . AAA (abdominal aortic aneurysm)     stress test 09/14/10 EPIC  . Anxiety   . Dysrhythmia     hx of atrial fib post op x 2 days   . COPD (chronic obstructive pulmonary disease)   . Shortness of breath     with exertion   . Recurrent upper respiratory infection (URI)     productive cough- white phlegm- no fever  . Sleep apnea      12/12 sleep study,SEVERE per study  Dr Delton Coombes- states doesnt wear machine on regular basis- setting BiPAP 9/9  . Hypertension     chest x ray 12/12 EPIC- repeated 06/06/11, EKG 11/12 EPIC   Current Facility-Administered Medications  Medication Dose Route Frequency Provider Last Rate Last Dose  . acetaminophen (TYLENOL) tablet 650 mg  650 mg Oral Q6H PRN Marcene Laskowski, MD       Or  . acetaminophen (TYLENOL) suppository 650 mg  650 mg Rectal Q6H PRN Lin Glazier, MD      . albuterol (PROVENTIL) (5 MG/ML) 0.5% nebulizer solution 2.5 mg  2.5 mg Nebulization Q2H PRN Tallula Grindle, MD      . albuterol (PROVENTIL) (5 MG/ML) 0.5% nebulizer solution 2.5 mg  2.5 mg Nebulization BID Loria Lacina, MD      . ALPRAZolam Prudy Feeler) tablet 0.25 mg  0.25 mg Oral BID PRN Briasia Flinders, MD   0.25 mg at 06/19/11 1458  . alum & mag hydroxide-simeth (MAALOX/MYLANTA) 200-200-20 MG/5ML suspension 30 mL  30 mL Oral Q6H PRN Areya Lemmerman, MD      . azithromycin (ZITHROMAX) tablet 250 mg  250 mg Oral Daily Avangelina Flight, MD   250 mg at 06/19/11 1138  . docusate sodium (COLACE) capsule 100 mg  100 mg Oral BID Harumi Yamin, MD   100 mg at 06/19/11 1136  . enoxaparin (LOVENOX) injection 40 mg  40 mg Subcutaneous Q24H Rogue Pautler, MD   40 mg at 06/18/11 1714  . furosemide (LASIX) injection 40 mg  40 mg Intravenous Once Jazmeen Axtell, MD      . guaiFENesin (MUCINEX) 12 hr tablet 1,200 mg  1,200 mg Oral BID Cheyrl Buley, MD   1,200 mg at 06/19/11 1135  . hydrALAZINE (APRESOLINE) injection 5 mg  5 mg Intravenous Q6H PRN Erubiel Manasco, MD      . hydrochlorothiazide (MICROZIDE) capsule 12.5 mg  12.5 mg Oral Daily Lannis Lichtenwalner, MD   12.5 mg at 06/19/11 1136  . HYDROcodone-acetaminophen (NORCO) 5-325 MG per tablet 1-2 tablet  1-2 tablet Oral Q4H PRN Conley Canal, MD   1 tablet at 06/18/11 2336  . lactobacillus (FLORANEX/LACTINEX) granules 1 g  1 g Oral TID WC Telly Broberg, MD   1 g at 06/19/11 1213  . metoprolol tartrate (LOPRESSOR) tablet 12.5 mg  12.5 mg Oral BID Miaisabella Bacorn, MD   12.5 mg at 06/19/11 1136  . morphine 2 MG/ML injection 2 mg  2 mg Intravenous Q4H PRN Shauni Henner, MD      . multivitamin with minerals tablet 1 tablet  1  tablet Oral Daily Dyesha Henault, MD   1 tablet at 06/19/11 1136  . nicotine (NICODERM CQ - dosed in mg/24 hours) patch 14 mg  14 mg Transdermal Daily Theora Vankirk, MD   14 mg at 06/19/11 1150  . ondansetron (ZOFRAN) tablet 4 mg  4 mg Oral Q6H PRN Persephonie Hegwood, MD       Or  . ondansetron (ZOFRAN) injection 4 mg  4 mg Intravenous Q6H PRN Ayomide Purdy, MD      . pantoprazole (PROTONIX) EC tablet 40 mg  40 mg Oral Q1200 Reford Olliff, MD   40 mg at 06/19/11 1213  . piperacillin-tazobactam (ZOSYN) IVPB 3.375 g  3.375 g Intravenous Q8H Tarus Briski, MD   3.375 g at 06/19/11 0540  . predniSONE (DELTASONE) tablet 40 mg  40 mg Oral Q breakfast Quantina Dershem, MD      . senna (SENOKOT) tablet 8.6 mg  1 tablet Oral BID Dhiren Azimi, MD   8.6 mg at 06/19/11 1137  . sodium chloride 0.9 % injection 3 mL  3 mL Intravenous Q12H Naz Denunzio, MD   3 mL at 06/19/11 1143  . tiotropium (SPIRIVA) inhalation capsule 18 mcg  18 mcg Inhalation Daily Deztiny Sarra, MD   18 mcg at 06/19/11 0949  .  vancomycin (VANCOCIN) 750 mg in sodium chloride 0.9 % 150 mL IVPB  750 mg Intravenous Q12H Maryanna Shape Run[place], PHARMD   750 mg at 06/19/11 1448  . DISCONTD: albuterol (PROVENTIL) (5 MG/ML) 0.5% nebulizer solution 2.5 mg  2.5 mg Nebulization Q6H Hinley Brimage, MD   2.5 mg at 06/19/11 0937  . DISCONTD: ALPRAZolam Prudy Feeler) tablet 0.25 mg  0.25 mg Oral QHS PRN Gardiner Espana, MD   0.25 mg at 06/18/11 2336  . DISCONTD: methylPREDNISolone sodium succinate (SOLU-MEDROL) 125 mg/2 mL injection 80 mg  80 mg Intravenous Q6H Ilyas Lipsitz, MD   80 mg at 06/18/11 1555  . DISCONTD: methylPREDNISolone sodium succinate (SOLU-MEDROL) 40 mg/mL injection 40 mg  40 mg Intravenous Q6H Alexys Lobello, MD   40 mg at 06/19/11 1447  . DISCONTD: vancomycin (VANCOCIN) IVPB 1000 mg/200 mL premix  1,000 mg Intravenous Q12H Malesha Suliman, MD   1,000 mg at 06/18/11 2336   Allergies  Allergen Reactions  . Lyrica (Pregabalin) Hives   Active Problems:  SOB (shortness of breath)  HCAP (healthcare-associated pneumonia)  COPD exacerbation  HTN (hypertension), malignant  H/O ventral hernia repair  Tobacco abuse  Paroxysmal a-fib  Aortic stenosis, severe  UTI (lower urinary tract infection)   Vital signs in last 24 hours: Temp:  [97.3 F (36.3 C)-97.4 F (36.3 C)] 97.4 F (36.3 C) (06/10 1409) Pulse Rate:  [59-70] 59  (06/10 1409) Resp:  [16-19] 16  (06/10 1409) BP: (124-162)/(61-83) 146/69 mmHg (06/10 1409) SpO2:  [92 %-95 %] 95 % (06/10 1409) Weight:  [88.814 kg (195 lb 12.8 oz)] 88.814 kg (195 lb 12.8 oz) (06/10 0540) Weight change: 0.914 kg (2 lb 0.3 oz) Last BM Date: 06/11/11  Intake/Output from previous day: 06/09 0701 - 06/10 0700 In: 317 [IV Piggyback:300] Out: 437 [Urine:425; Drains:12] Intake/Output this shift: Total I/O In: 243 [P.O.:240; I.V.:3] Out: 350 [Urine:350]  Lab Results:  Basename 06/19/11 0428 06/18/11 0525  WBC 15.8* 7.6  HGB 13.9 14.3  HCT 40.3 41.9  PLT 234 231   BMET  Basename  06/19/11 0428 06/18/11 0525  NA 134* 135  K 3.9 3.8  CL 96 96  CO2 25 25  GLUCOSE 160* 159*  BUN  32* 20  CREATININE 1.35 1.06  CALCIUM 9.6 9.8    Studies/Results: No results found.  Medications: I have reviewed the patient's current medications.   Physical exam GENERAL- alert HEAD- normal atraumatic, no neck masses, normal thyroid, no jvd RESPIRATORY- appears well, vitals normal, no respiratory distress, acyanotic, normal RR, ear and throat exam is normal, neck free of mass or lymphadenopathy, chest clear, no wheezing, crepitations, rhonchi, normal symmetric air entry CVS- regular rate and rhythm, S1, S2 normal, no murmur, click, rub or gallop ABDOMEN- drains in place. NEURO- Grossly normal EXTREMITIES- extremities normal, atraumatic, no cyanosis or edema  Plan   .SOB (shortness of breath)/HCAP (healthcare-associated pneumonia)/COPD exacerbation/Tobacco abuse- Clinically better. More rested. Vanc/zosyn/zithromax/short course of steroids( switch to prednisone)/bronchodilators/O2 supplementation as necessary/mucinex.  CPAP when sleeping. Marland KitchenHTN (hypertension), malignant- better controlled( Continue beta blocker as hx of aortic aneurysm)/diuretic/hydralazine prn. .Paroxysmal a-fib- rate controlled on beta blocker. Not anticoagulated, ?reason.  .Ventral hernia- Seems ok,will notify CCS of patient's admission in am. Ask WOC to follow.  Dvt/gi prophylaxis.  Condition  Guarded. Hopefully home tomorrow.      Steve [name] 06/19/2011 4:31 PM Pager: [phone].

## 2011-06-19 NOTE — Progress Notes (Signed)
RT setup patient on CPAP IPAP 12, EPAP 6, adjusted settings to 10/6 for patient comfort.  Patient resting well

## 2011-06-20 ENCOUNTER — Encounter (INDEPENDENT_AMBULATORY_CARE_PROVIDER_SITE_OTHER): Payer: Medicare Other | Admitting: Surgery

## 2011-06-20 ENCOUNTER — Telehealth: Payer: Self-pay | Admitting: Internal Medicine

## 2011-06-20 DIAGNOSIS — J438 Other emphysema: Secondary | ICD-10-CM

## 2011-06-20 DIAGNOSIS — J189 Pneumonia, unspecified organism: Secondary | ICD-10-CM

## 2011-06-20 DIAGNOSIS — I10 Essential (primary) hypertension: Secondary | ICD-10-CM

## 2011-06-20 DIAGNOSIS — K439 Ventral hernia without obstruction or gangrene: Secondary | ICD-10-CM

## 2011-06-20 LAB — BASIC METABOLIC PANEL
CO2: 27 mEq/L (ref 19–32)
Chloride: 98 mEq/L (ref 96–112)
Creatinine, Ser: 1.27 mg/dL (ref 0.50–1.35)
GFR calc Af Amer: 62 mL/min — ABNORMAL LOW (ref >90)
Sodium: 137 mEq/L (ref 135–145)

## 2011-06-20 LAB — CBC
MCV: 93.8 fL (ref 78.0–100.0)
Platelets: 258 10*3/uL (ref 150–400)
RBC: 4.51 MIL/uL (ref 4.22–5.81)
RDW: 14.1 % (ref 11.5–15.5)
WBC: 15.6 10*3/uL — ABNORMAL HIGH (ref 4.0–10.5)

## 2011-06-20 MED ORDER — SERTRALINE HCL 50 MG PO TABS: 50.0000 mg | ORAL_TABLET | Freq: Every day | ORAL | Status: DC

## 2011-06-20 MED ORDER — ADULT MULTIVITAMIN W/MINERALS CH: 1.0000 | ORAL_TABLET | Freq: Every day | ORAL | Status: DC

## 2011-06-20 MED ORDER — SENNA 8.6 MG PO TABS: 1.0000 | ORAL_TABLET | Freq: Two times a day (BID) | ORAL | Status: DC

## 2011-06-20 MED ORDER — ALBUTEROL SULFATE HFA 108 (90 BASE) MCG/ACT IN AERS: 2.0000 | INHALATION_SPRAY | Freq: Four times a day (QID) | RESPIRATORY_TRACT | Status: AC | PRN

## 2011-06-20 MED ORDER — PREDNISONE 10 MG PO TABS: ORAL_TABLET | ORAL | Status: DC

## 2011-06-20 MED ORDER — METOPROLOL TARTRATE 12.5 MG HALF TABLET: 12.5000 mg | ORAL_TABLET | Freq: Two times a day (BID) | ORAL | Status: DC

## 2011-06-20 MED ORDER — FLORANEX PO PACK: 1.0000 g | PACK | Freq: Three times a day (TID) | ORAL | Status: DC

## 2011-06-20 MED ORDER — GUAIFENESIN ER 600 MG PO TB12: 1200.0000 mg | ORAL_TABLET | Freq: Two times a day (BID) | ORAL | Status: DC

## 2011-06-20 MED ORDER — LEVOFLOXACIN 750 MG PO TABS: 750.0000 mg | ORAL_TABLET | Freq: Every day | ORAL | Status: DC

## 2011-06-20 NOTE — Discharge Summary (Signed)
DISCHARGE SUMMARY  Steve [name] [name]  Steve [name]#: [id number]  DOB:11-12-1933  Date of Admission: 06/17/2011 Date of Discharge: 06/20/2011  Attending Physician:Emersen Carroll  Patient's [name], Ala Dach, MD, MD  Consults:Treatment Team:  Velora Heckler, MD Md Ccs, MD  Discharge Diagnoses: Present on Admission:  .SOB (shortness of breath) .HCAP (healthcare-associated pneumonia) .COPD exacerbation .HTN (hypertension), malignant .Tobacco abuse .Paroxysmal a-fib .Aortic stenosis, severe .UTI (lower urinary tract infection)  Hospital Course: Steve [name] [name] was admitted with increasing cough and shortness of breath, day after discharge for ventral hernia repair. He was in acute exacerbation of copd, with possibility of hcap by cxr. A 2decho showed normal ef (55-60%), and grade 1 diastolic dysfunction. Urine culture showed insignificant number of colonies. He was placed on broad spectrum abx, short course of steroids, and he feels much better today. He is anxious to go home with his wife who is also getting discharged from the hospital today. He should continue levaquin to complete course, and follow with his PCP to ensure complete resolution of hcap. He was seen by dr [name] in the hospital and should follow with him as scheduled. Of note is that Steve [name] [name] has leukocytosis, likely steroid induced. He needs optimization of his blood pressure. I have held hctz in view of prerenal azotemia.   Medication List  As of 06/20/2011  2:43 PM   STOP taking these medications         cefdinir 300 MG capsule         TAKE these medications         albuterol 108 (90 BASE) MCG/ACT inhaler   Commonly known as: PROVENTIL HFA;VENTOLIN HFA   Inhale 2 puffs into the lungs every 6 (six) hours as needed for wheezing.      ALPRAZolam 0.25 MG tablet   Commonly known as: XANAX   Take 0.25 mg by mouth at bedtime as needed.      guaiFENesin 600 MG 12 hr tablet   Commonly known as: MUCINEX   Take 2 tablets  (1,200 mg total) by mouth 2 (two) times daily.      HYDROcodone-acetaminophen 5-325 MG per tablet   Commonly known as: NORCO   Take 1-2 tablets by mouth every 4 (four) hours as needed for pain.      lactobacillus Pack   Take 1 packet (1 g total) by mouth 3 (three) times daily with meals.      levofloxacin 750 MG tablet   Commonly known as: LEVAQUIN   Take 1 tablet (750 mg total) by mouth daily.      metoprolol tartrate 12.5 mg Tabs   Commonly known as: LOPRESSOR   Take 0.5 tablets (12.5 mg total) by mouth 2 (two) times daily.      multivitamin with minerals Tabs   Take 1 tablet by mouth daily.      predniSONE 10 MG tablet   Commonly known as: DELTASONE   Take 4 tablets on the first day, then 3 tablets daily for 2 days, then 2 tablets daily for 2 days, then 1 tablet daily for 2 days.      senna 8.6 MG Tabs   Commonly known as: SENOKOT   Take 1 tablet (8.6 mg total) by mouth 2 (two) times daily.      tiotropium 18 MCG inhalation capsule   Commonly known as: SPIRIVA   Place 1 capsule (18 mcg total) into inhaler and inhale daily.             Day  of Discharge BP 169/84  Pulse 53  Temp(Src) 98.2 F (36.8 C) (Oral)  Resp 16  Ht 5\' 10"  (1.778 m)  Wt 88.633 kg (195 lb 6.4 oz)  BMI 28.04 kg/m2  SpO2 96%  Physical Exam: Sitting up, not in distress.  Results for orders placed during the hospital encounter of 06/17/11 (from the past 24 hour(s))  CBC     Status: Abnormal   Collection Time   06/20/11  4:25 AM      Component Value Range   WBC 15.6 (*) 4.0 - 10.5 (K/uL)   RBC 4.51  4.22 - 5.81 (MIL/uL)   Hemoglobin 13.9  13.0 - 17.0 (g/dL)   HCT 16.1  09.6 - 04.5 (%)   MCV 93.8  78.0 - 100.0 (fL)   MCH 30.8  26.0 - 34.0 (pg)   MCHC 32.9  30.0 - 36.0 (g/dL)   RDW 40.9  81.1 - 91.4 (%)   Platelets 258  150 - 400 (K/uL)  BASIC METABOLIC PANEL     Status: Abnormal   Collection Time   06/20/11  4:25 AM      Component Value Range   Sodium 137  135 - 145 (mEq/L)    Potassium 4.1  3.5 - 5.1 (mEq/L)   Chloride 98  96 - 112 (mEq/L)   CO2 27  19 - 32 (mEq/L)   Glucose, Bld 148 (*) 70 - 99 (mg/dL)   BUN 42 (*) 6 - 23 (mg/dL)   Creatinine, Ser 7.82  0.50 - 1.35 (mg/dL)   Calcium 9.7  8.4 - 95.6 (mg/dL)   GFR calc non Af Amer 53 (*) >90 (mL/min)   GFR calc Af Amer 62 (*) >90 (mL/min)    Disposition: home today.   Follow-up Appts: Discharge Orders    Future Appointments: Provider: Department: Dept Phone: Center:   06/27/2011 1:30 PM Leslye Peer, MD [facility] [phone] None   08/01/2011 8:30 AM Milas Gain, MD [facility] [phone] None     Future Orders Please Complete By Expires   Diet - low sodium heart healthy      Increase activity slowly           Tests Needing Follow-up: Cbc/bmp in 1 week.  Time spent in discharge (includes decision making & examination of pt): 35 minutes  Signed: Mahlet Jergens 06/20/2011, 2:43 PM

## 2011-06-20 NOTE — Telephone Encounter (Signed)
Previously took celexa 20mg  po qd. Ok to resume if no interactions. pls confirm no si/hi.

## 2011-06-20 NOTE — Telephone Encounter (Signed)
Call returned to patient [phone], spoke with patients wife Steve [name], she was informed of medication per Dr Rodena Medin instructions. She was asked if there is any concerns with suicidal or self harm. She stated patient is not suicidal and no concerns with harming himself.  Call placed to patient, his wife Steve [name] was informed of potential interaction with Celexa, per Dr Rodena Medin instructions she was informed Zoloft 50 mg once day would be sent to pharmacy. She has verbalized understanding.

## 2011-06-20 NOTE — Telephone Encounter (Signed)
Patients wife called and left voice message stating she and the patient are in the hospital. Her message stated patient is crying all the time and he has stopped his nerve pills on his own. She would like to know if Dr Rodena Medin would provide a new Rx. Call returned to patient. Spoke with patients wife [name]. She stated patient had surgery 06/12/2011. He was in the hospital for 5 days and home for 1 night ,and readmitted with pneumonia. He was released today. She was asked about the name of the medication. She stated that she was unable to recall the name of the medication, but it is not the Xanax.

## 2011-06-22 ENCOUNTER — Ambulatory Visit (INDEPENDENT_AMBULATORY_CARE_PROVIDER_SITE_OTHER): Payer: Medicare Other | Admitting: Surgery

## 2011-06-22 ENCOUNTER — Telehealth: Payer: Self-pay | Admitting: *Deleted

## 2011-06-22 ENCOUNTER — Encounter (INDEPENDENT_AMBULATORY_CARE_PROVIDER_SITE_OTHER): Payer: Self-pay | Admitting: Surgery

## 2011-06-22 VITALS — BP 132/76 | HR 72 | Temp 96.8°F | Resp 18 | Ht 70.0 in | Wt 195.0 lb

## 2011-06-22 DIAGNOSIS — K432 Incisional hernia without obstruction or gangrene: Secondary | ICD-10-CM

## 2011-06-22 NOTE — Telephone Encounter (Signed)
Patients daughter called and left voice message stating patients wife has stated patient would like to stop smoking and would like to know if Dr Rodena Medin would provide a Rx for a nicotine patch to [facility] on [address].

## 2011-06-22 NOTE — Progress Notes (Signed)
Visit Diagnoses: 1. Incisional hernia     HISTORY: The patient returns for wound check having undergone repair of ventral incisional hernia with mesh.  EXAM: Midline wound is dry and intact. No sign of infection or cellulitis. Left drain has thrombosed and is removed completely. Right drain continues to have moderate serous sanguinous drainage.  IMPRESSION: Status post open ventral incisional hernia repair with mesh  PLAN: Patient will continue to monitor the remaining drain. He may shower. He will continue to wear an abdominal binder. He will return in 4 days for wound check and drain removal.  Velora Heckler, MD, FACS General & Endocrine Surgery [facility]

## 2011-06-22 NOTE — Telephone Encounter (Signed)
Notified pt's daughter, Linda.

## 2011-06-22 NOTE — Patient Instructions (Signed)
Drain care as instructed.  Wear binder.  May shower.  Velora Heckler, MD, [facility] Office: [phone]

## 2011-06-22 NOTE — Telephone Encounter (Signed)
Nicotine patches otc. Can't smoke while using them

## 2011-06-23 ENCOUNTER — Other Ambulatory Visit: Payer: Self-pay | Admitting: Internal Medicine

## 2011-06-23 MED ORDER — SERTRALINE HCL 50 MG PO TABS: 50.0000 mg | ORAL_TABLET | Freq: Every day | ORAL | Status: DC

## 2011-06-25 ENCOUNTER — Encounter (HOSPITAL_COMMUNITY): Payer: Self-pay | Admitting: Family Medicine

## 2011-06-25 ENCOUNTER — Emergency Department (HOSPITAL_COMMUNITY): Payer: Medicare Other

## 2011-06-25 ENCOUNTER — Emergency Department (HOSPITAL_COMMUNITY)
Admission: EM | Admit: 2011-06-25 | Discharge: 2011-06-25 | Disposition: A | Discharge status: Home / Self Care | Payer: Medicare Other | Attending: Emergency Medicine | Admitting: Emergency Medicine

## 2011-06-25 DIAGNOSIS — S32000A Wedge compression fracture of unspecified lumbar vertebra, initial encounter for closed fracture: Secondary | ICD-10-CM

## 2011-06-25 DIAGNOSIS — F17200 Nicotine dependence, unspecified, uncomplicated: Secondary | ICD-10-CM | POA: Insufficient documentation

## 2011-06-25 DIAGNOSIS — M129 Arthropathy, unspecified: Secondary | ICD-10-CM | POA: Insufficient documentation

## 2011-06-25 DIAGNOSIS — W108XXA Fall (on) (from) other stairs and steps, initial encounter: Secondary | ICD-10-CM | POA: Insufficient documentation

## 2011-06-25 DIAGNOSIS — K219 Gastro-esophageal reflux disease without esophagitis: Secondary | ICD-10-CM | POA: Insufficient documentation

## 2011-06-25 DIAGNOSIS — G4730 Sleep apnea, unspecified: Secondary | ICD-10-CM | POA: Insufficient documentation

## 2011-06-25 DIAGNOSIS — S32009A Unspecified fracture of unspecified lumbar vertebra, initial encounter for closed fracture: Secondary | ICD-10-CM | POA: Insufficient documentation

## 2011-06-25 DIAGNOSIS — J4489 Other specified chronic obstructive pulmonary disease: Secondary | ICD-10-CM | POA: Insufficient documentation

## 2011-06-25 DIAGNOSIS — J449 Chronic obstructive pulmonary disease, unspecified: Secondary | ICD-10-CM | POA: Insufficient documentation

## 2011-06-25 DIAGNOSIS — I10 Essential (primary) hypertension: Secondary | ICD-10-CM | POA: Insufficient documentation

## 2011-06-25 MED ORDER — HYDROMORPHONE HCL PF 1 MG/ML IJ SOLN
1.0000 mg | Freq: Once | INTRAMUSCULAR | Status: AC
  Administered 2011-06-25: 1 mg via INTRAMUSCULAR
  Filled 2011-06-25: qty 1

## 2011-06-25 MED ORDER — OXYCODONE-ACETAMINOPHEN 5-325 MG PO TABS: 1.0000 | ORAL_TABLET | Freq: Four times a day (QID) | ORAL | Status: DC | PRN

## 2011-06-25 NOTE — ED Notes (Signed)
MD at bedside. 

## 2011-06-25 NOTE — ED Provider Notes (Signed)
History     CSN: [id number]  Arrival date & time 06/25/11  4098   First MD Initiated Contact with Patient 06/25/11 0804      Chief Complaint  Patient presents with  . Back Pain    (Consider location/radiation/quality/duration/timing/severity/associated sxs/prior treatment) Patient is a 78 y.o. male presenting with back pain. The history is provided by the patient.  Back Pain  This is a new problem. Pertinent negatives include no chest pain, no numbness, no headaches, no abdominal pain and no weakness.   patient tripped and fell on some stairs and fell backwards and hit his back on a pole. He fell on Friday. He hurt somewhat at the time but is improved on Saturday. Today he returns with increased pain. Is worse with movement. No relief with Vicodin home. No numbness or weakness. No chest pain. He states he did not hit his head. He had a previous remote history of back pain, but states does not feel like this. He also has had a recent abdominal hernia repair. No bleeding from his wound. No abdominal pain. No urinary symptoms.  Past Medical History  Diagnosis Date  . Prostate enlargement   . History of chicken pox     childhood  . GERD (gastroesophageal reflux disease)   . Arthritis     knee and back  . AAA (abdominal aortic aneurysm)     stress test 09/14/10 [facility]  . Anxiety   . Dysrhythmia     hx of atrial fib post op x 2 days   . COPD (chronic obstructive pulmonary disease)   . Shortness of breath     with exertion   . Recurrent upper respiratory infection (URI)     productive cough- white phlegm- no fever  . Sleep apnea      12/12 sleep study,SEVERE per study  Dr Delton Coombes- states doesnt wear machine on regular basis- setting BiPAP 9/9  . Hypertension     chest x ray 12/12 EP[facility]- repeated 06/06/11, EKG 11/12 [facility]    Past Surgical History  Procedure Date  . Knee surgery 1957    left knee arthotomy  . Abdominal aortic aneurysm repair 11/14/2010    AAA Repair    Dr Arbie Cookey  .  Transurethral resection of prostate 01/25/2011    TURP  . Incisional hernia repair 06/12/2011    Procedure: HERNIA REPAIR INCISIONAL;  Surgeon: Velora Heckler, MD;  Location: [facility];  Service: General;  Laterality: N/A;  Open Repair Ventral Incisional Hernia with Mesh    Family History  Problem Relation Age of Onset  . Arthritis Mother   . Heart failure Mother   . Hypertension Mother   . Diabetes Mother   . Heart failure Daughter     deceased age 48  . Other Daughter     deceased age 34 MVA    History  Substance Use Topics  . Smoking status: Current Everyday Smoker -- 2.0 packs/day for 55 years    Types: Cigarettes  . Smokeless tobacco: Never Used  . Alcohol Use: No     no etoh in 28 years       Review of Systems  Constitutional: Negative for activity change and appetite change.  HENT: Negative for neck stiffness.   Eyes: Negative for pain.  Respiratory: Negative for chest tightness and shortness of breath.   Cardiovascular: Negative for chest pain and leg swelling.  Gastrointestinal: Negative for nausea, vomiting, abdominal pain and diarrhea.  Genitourinary: Negative for flank pain.  Musculoskeletal: Positive for back pain.  Skin: Negative for rash.  Neurological: Negative for weakness, numbness and headaches.  Psychiatric/Behavioral: Negative for behavioral problems.    Allergies  Lyrica  Home Medications   Current Outpatient Rx  Name Route Sig Dispense Refill  . ALBUTEROL SULFATE HFA 108 (90 BASE) MCG/ACT IN AERS Inhalation Inhale 2 puffs into the lungs every 6 (six) hours as needed for wheezing. 1 Inhaler 2  . ALPRAZOLAM 0.25 MG PO TABS Oral Take 0.25 mg by mouth at bedtime as needed.    . GUAIFENESIN ER 600 MG PO TB12 Oral Take 2 tablets (1,200 mg total) by mouth 2 (two) times daily. 6 tablet 0  . HYDROCODONE-ACETAMINOPHEN 5-325 MG PO TABS Oral Take 1-2 tablets by mouth every 4 (four) hours as needed for pain. 30 tablet 0  . FLORANEX PO PACK Oral Take 1 packet  (1 g total) by mouth 3 (three) times daily with meals. 9 packet 0  . LEVOFLOXACIN 750 MG PO TABS Oral Take 1 tablet (750 mg total) by mouth daily. 4 tablet 0  . METOPROLOL TARTRATE 12.5 MG HALF TABLET Oral Take 0.5 tablets (12.5 mg total) by mouth 2 (two) times daily. 30 tablet 0  . ADULT MULTIVITAMIN W/MINERALS CH Oral Take 1 tablet by mouth daily. 30 tablet 0  . SENNA 8.6 MG PO TABS Oral Take 1 tablet (8.6 mg total) by mouth 2 (two) times daily. 10 tablet 0  . SERTRALINE HCL 50 MG PO TABS Oral Take 1 tablet (50 mg total) by mouth daily. 30 tablet 6  . TIOTROPIUM BROMIDE MONOHYDRATE 18 MCG IN CAPS Inhalation Place 1 capsule (18 mcg total) into inhaler and inhale daily. 30 capsule 3  . OXYCODONE-ACETAMINOPHEN 5-325 MG PO TABS Oral Take 1-2 tablets by mouth every 6 (six) hours as needed for pain. 20 tablet 0    BP 139/75  Pulse 65  Temp 97.9 F (36.6 C) (Oral)  Resp 22  SpO2 94%  Physical Exam  Nursing note and vitals reviewed. Constitutional: He is oriented to person, place, and time. He appears well-developed and well-nourished.  HENT:  Head: Normocephalic and atraumatic.  Eyes: EOM are normal. Pupils are equal, round, and reactive to light.  Neck: Normal range of motion. Neck supple.  Cardiovascular: Normal rate, regular rhythm and normal heart sounds.   No murmur heard. Pulmonary/Chest: Effort normal and breath sounds normal.  Abdominal: Soft. Bowel sounds are normal. He exhibits no distension and no mass. There is tenderness. There is no rebound and no guarding.       Minimal abdominal tenderness and surgical incision site. Large midline surgical incision with staples. Wounds clean dry and intact.  Musculoskeletal: Normal range of motion. He exhibits tenderness. He exhibits no edema.       Tender lower lumbar spine without deformity, step off or crepitance.   Neurological: He is alert and oriented to person, place, and time. No cranial nerve deficit.  Skin: Skin is warm and dry.    Psychiatric: He has a normal mood and affect.    ED Course  Procedures (including critical care time)  Labs Reviewed - No data to display Dg Lumbar Spine Complete  06/25/2011  *RADIOLOGY REPORT*  Clinical Data: History of recent fall complaining of back pain.  LUMBAR SPINE - COMPLETE 4+ VIEW  Comparison: Comparison is made with prior CT of the abdomen 08/30/2010 (sagittal reformats).  Findings: Compared to the prior examination there is a new compression fracture at L1 with approximately 25%  loss of anterior vertebral body height.  On the frontal projection, and surrounding soft tissue swelling cannot be confirmed.  There are old compression fractures and T11 and T12 which appear unchanged, with less than 20% loss of anterior vertebral body height at both levels.  Multilevel degenerative disc disease is noted, most severe at T11-T12 and L5-S1.  Mild multilevel facet arthropathy is also noted, most severe at L4-L5 and L5-S1.  Extensive atherosclerosis throughout the visualized vasculature.  Multiple surgical clips are seen throughout the abdomen, and there.  Midline abdominal staples present.  IMPRESSION: 1.  Age indeterminate compression fracture at L1 with approximately 25% loss of anterior vertebral body height which is new compared to prior examination 08/30/2010.  If there is clinical concern for acute compression fracture, this could be better evaluated with CT. Notably, there is no evidence to suggest retropulsion of fracture fragments at this level. 2.  Old compression fractures at T11 and T12 are unchanged, as above. 3.  Multilevel degenerative disc disease and lumbar spondylosis, as above. 4.  Atherosclerosis.  Original Report Authenticated By: Florencia Reasons, M.D.     1. Lumbar compression fracture       MDM  Patient with lumbar compression fracture after a fall. No neuro deficits. Patient's pain is improved with blood. He is ambulatory. He'll followup with neurosurgery as  needed.        Juliet Rude. Rubin Payor, MD 06/25/11 0930

## 2011-06-25 NOTE — ED Notes (Signed)
Pt currently in radiology.

## 2011-06-25 NOTE — ED Notes (Signed)
Pt is back in room

## 2011-06-25 NOTE — ED Notes (Addendum)
Pt states he fell and hit a post on his car port Friday night. Pt states the pain is the lower part of his back. Pt states he took ( 2) 325 mg vicodin this morning w/o relieve.

## 2011-06-25 NOTE — ED Notes (Signed)
Pt sts fell Friday night and injured back. Pt recent hernia operation. sts he tripped and fell. Denies syncope. sts he hit his back on a post in the carport. Denies hitting head

## 2011-06-25 NOTE — Discharge Instructions (Signed)
Back, Compression Fracture  A compression fracture happens when a force is put upon the length of your spine. Slipping and falling on your bottom are examples of such a force. When this happens, sometimes the force is great enough to compress the building blocks (vertebral bodies) of your spine. Although this causes a lot of pain, this can usually be treated at home, unless your caregiver feels hospitalization is needed for pain control.  Your backbone (spinal column) is made up of 24 main vertebral bodies in addition to the sacrum and coccyx (see illustration). These are held together by tough fibrous tissues (ligaments) and by support of your muscles. Nerve roots pass through the openings between the vertebrae. A sudden wrenching move, injury, or a fall may cause a compression fracture of one of the vertebral bodies. This may result in back pain or spread of pain into the belly (abdomen), the buttocks, and down the leg into the foot. Pain may also be created by muscle spasm alone.  Large studies have been undertaken to determine the best possible course of action to help your back following injury and also to prevent future problems. The recommendations are as follows.  FOLLOWING A COMPRESSION FRACTURE:  Do the following only if advised by your caregiver.    If a back brace has been suggested or provided, wear it as directed.   DO NOT stop wearing the back brace unless instructed by your caregiver.   When allowed to return to regular activities, avoid a sedentary life style. Actively exercise. Sporadic weekend binges of tennis, racquetball, water skiing, may actually aggravate or create problems, especially if you are not in condition for that activity.   Avoid sports requiring sudden body movements until you are in condition for them. Swimming and walking are safer activities.   Maintain good posture.   Avoid obesity.   If not already done, you should have a DEXA scan. Based on the results, be treated for  osteoporosis.  FOLLOWING ACUTE (SUDDEN) INJURY:   Only take over-the-counter or prescription medicines for pain, discomfort, or fever as directed by your caregiver.   Use bed rest for only the most extreme acute episode. Prolonged bed rest may aggravate your condition. Ice used for acute conditions is effective. Use a large plastic bag filled with ice. Wrap it in a towel. This also provides excellent pain relief. This may be continuous. Or use it for 30 minutes every 2 hours during acute phase, then as needed. Heat for 30 minutes prior to activities is helpful.   As soon as the acute phase (the time when your back is too painful for you to do normal activities) is over, it is important to resume normal activities and work hardening programs. Back injuries can cause potentially marked changes in lifestyle. So it is important to attack these problems aggressively.   See your caregiver for continued problems. He or she can help or refer you for appropriate exercises, physical therapy and work hardening if needed.   If you are given narcotic medications for your condition, for the next 24 hours DO NOT:   Drive   Operate machinery or power tools.   Sign legal documents.   DO NOT drink alcohol, take sleeping pills or other medications that may interfere with treatment.  If your caregiver has given you a follow-up appointment, it is very important to keep that appointment. Not keeping the appointment could result in a chronic or permanent injury, pain, and disability. If there is any   problem keeping the appointment, you must call back to this facility for assistance.   SEEK IMMEDIATE MEDICAL CARE IF:   You develop numbness, tingling, weakness, or problems with the use of your arms or legs.   You develop severe back pain not relieved with medications.   You have changes in bowel or bladder control.   You have increasing pain in any areas of the body.  Document Released: 12/26/2004 Document Revised: 12/15/2010  Document Reviewed: 07/31/2007  [employer] Patient Information 2012 [employer].

## 2011-06-26 ENCOUNTER — Telehealth: Payer: Self-pay | Admitting: *Deleted

## 2011-06-26 ENCOUNTER — Encounter (INDEPENDENT_AMBULATORY_CARE_PROVIDER_SITE_OTHER): Payer: Self-pay | Admitting: Surgery

## 2011-06-26 ENCOUNTER — Ambulatory Visit (INDEPENDENT_AMBULATORY_CARE_PROVIDER_SITE_OTHER): Payer: Medicare Other | Admitting: Surgery

## 2011-06-26 VITALS — BP 120/58 | HR 60 | Temp 97.4°F | Resp 20 | Ht 69.0 in | Wt 193.0 lb

## 2011-06-26 DIAGNOSIS — S32000A Wedge compression fracture of unspecified lumbar vertebra, initial encounter for closed fracture: Secondary | ICD-10-CM

## 2011-06-26 DIAGNOSIS — K432 Incisional hernia without obstruction or gangrene: Secondary | ICD-10-CM

## 2011-06-26 NOTE — Patient Instructions (Signed)
As instructed.  Wear binder.  Velora Heckler, MD, [facility] Office: [phone]

## 2011-06-26 NOTE — Telephone Encounter (Signed)
Call placed to patient's daughter at  [phone], she was informed per Dr Rodena Medin approval Rx for hospital bed was sent to [facility]. She was advised to contact  [facility] to check on status; phone number was provided. No further action required.

## 2011-06-26 NOTE — Telephone Encounter (Signed)
Patients daughter Donalynn Furlong called and left voice message requesting a Rx for a hospital bed for patients back. If approved she is requesting the Rx sent to [facility]. Her voice message stated that she spoke with Dr Rodena Medin this morning regarding obtaining one, and they would like to try to have it delivered by this evening. Bonita Quin stated in her voice message that patient was unable to get any sleep last night. Please advise

## 2011-06-26 NOTE — Progress Notes (Signed)
General Surgery [facility]  Visit Diagnoses: 1. Incisional hernia     HISTORY: Patient returns having undergone open ventral incisional hernia repair with mesh. Postoperative course was complicated by pneumonia requiring readmission. Since discharge he is also sustained a significant fall with some vertebral fractures. Patient returns today for drain removal and staple removal.  EXAM: Abdominal incision is healing nicely. Remaining drain is removed. It has only serous fluid. Staples were removed and benzoin and Steri-Strips are applied.  IMPRESSION: Status post open ventral incisional hernia repair with mesh  PLAN: Patient will continue to wear his abdominal binder. Wound care instructions are given. Patient will return in 2 weeks for wound check.  Velora Heckler, MD, FACS General & Endocrine Surgery [facility]

## 2011-06-27 ENCOUNTER — Encounter: Payer: Self-pay | Admitting: Emergency Medicine

## 2011-06-27 ENCOUNTER — Ambulatory Visit (INDEPENDENT_AMBULATORY_CARE_PROVIDER_SITE_OTHER): Payer: Medicare Other | Admitting: Emergency Medicine

## 2011-06-27 VITALS — BP 116/62 | HR 57 | Temp 97.1°F | Ht 70.0 in | Wt 196.4 lb

## 2011-06-27 DIAGNOSIS — T148XXA Other injury of unspecified body region, initial encounter: Secondary | ICD-10-CM

## 2011-06-27 DIAGNOSIS — J449 Chronic obstructive pulmonary disease, unspecified: Secondary | ICD-10-CM

## 2011-06-27 DIAGNOSIS — IMO0002 Reserved for concepts with insufficient information to code with codable children: Secondary | ICD-10-CM

## 2011-06-27 MED ORDER — HYDROCODONE-ACETAMINOPHEN 10-325 MG PO TABS: 1.0000 | ORAL_TABLET | Freq: Four times a day (QID) | ORAL | Status: AC | PRN

## 2011-06-27 NOTE — Assessment & Plan Note (Signed)
To be seen by ortho this week, asking for narcs to cover him till then.  - one time script for norco

## 2011-06-27 NOTE — Assessment & Plan Note (Signed)
-   continue spiriva + SABA - discussed tobacco cessation = goal down to 1/2 pk/day by next visit - rov 6 mon

## 2011-06-27 NOTE — Patient Instructions (Addendum)
Please continue your Spiriva and ProAir as you are taking them We have set the goal that YOU WILL BE DOWN TO 1/2 PACK OF CIGARETTES BY YOUR NEXT VISIT A script for Norco has been given for your compression fracture Follow with Dr Delton Coombes in 6 months or sooner if you have any problems

## 2011-06-27 NOTE — Progress Notes (Signed)
Subjective:    Patient ID: Steve [name], male    DOB: Feb 07, 1933, 78 y.o.   MRN: [id number]  HPI 78 year-old male with a known history of COPD (former smoker) and  AAA w/ planned aneurysm repair on 11/14/2010. Patient had postop complications with respiratory acidosis and hypoxemia requiring intubation.  Critical care team was consulted  11/28/2010 Post hospital  Pt returns for post hospital followup. Patient was hospitalized November through November 12 for planned abdominal aortic aneurysm repair. Patient had postop complications with respiratory acidosis and hypoxemia requiring intubation and vent support. Critical care team with initial consult. Patient had known underlying COPD and was on home O2 followed by his primary care doctor. Patient was treated with aggressive pulmonary hygiene along with IV antibiotics and diuresis and was successfully extubated on November 8. Patient was started on Spiriva. Echocardiogram during admission showed normal left ventricular function. Patient did have atrial fibrillation during hospitalization. He was started on metoprolol and converted over to normal sinus rhythm prior to discharge. This was felt likely to stress response and hypokalemia. Patient's Norvasc was held prior to discharge. Patient does complain that he believes he has sleep apnea and wants to be tested for this. Patient's wife says that he snores  and stops breathing intermittently. He complains of daytime hypersomnolence  -tired all the time.   Since discharge. Patient says he is slowly improving. He denies any cough, chest pain, wheezing, or increased leg swelling. Continues to feel extremely weak. It wears out easily with shortness of breath with activity.  ROV 12/29/10 --78yo  (> 120 pk-yrs),  follows up after acute episode hypercapneic resp failure while hospitalized post-AAA repair 11/12. He established care here 11/12, was seen by [place] in house. Had PFT today = normal AF to MILD AFL, no BD  response, decreased RV, decreased DLCO. He also had PSG 12/14. He is seeing Dr Hillis Range for TURP in January. He is unfortunately smoking again - 1 pk/day. He was started on Spiriva at time of discharge.    PULMONARY FUNCTON TEST 12/29/2010  FVC 3.59  FEV1 2.55  FEV1/FVC 71  FVC  % Predicted 91  FEV % Predicted 98  FeF 25-75 1.79  FeF 25-75 % Predicted 2.33   ROV 04/21/11 -- Follows up for dyspnea, tobacco use, OSA/OHS. We ordered BiPAP 15/9 since last visit, but he is unable to wear because the pressure is too high. He is smoking 2pk/day - pft showed mild AFL last time. He is on Spiriva, not sure it is helping him. Uses SABA rarely.    Acute OV 06/07/11  Complains of  increased SOB, prod cough with white mucus, wheezing x2 days - denies f/c/s, tightness.  will have hernia repair on 6.3.13 OTC not working  Wants to be better before surgery.  No fever or hemotpysis   ROV 06/26/11 -- COPD (mild AFL), continues to smoke 1 pk a day. Recent hernia surgery by Dr [name]. Yesterday fell and is dealing with a compression fx. Planning to be seen by ortho this week.  Breathing is stable, using SABA bid.  No flares since last time.     Objective:   Physical Exam GEN: A/Ox3; pleasant , NAD, elderly   HEENT:  [place]/AT,  EACs-clear, TMs-wnl, NOSE-clear, THROAT-clear, no lesions, no postnasal drip or exudate noted.   NECK:  Supple w/ fair ROM; no JVD; normal carotid impulses w/o bruits; no thyromegaly or nodules palpated; no lymphadenopathy.  RESP  Coarse BS w/o, wheezes/ rales/ or rhonchi.no  accessory muscle use, no dullness to percussion  CARD:  RRR, no m/r/g  , tr peripheral edema, pulses intact, no cyanosis or clubbing.  Musco: Warm bil, no deformities or joint swelling noted.   Neuro: alert, no focal deficits noted.    Skin: Warm, no lesions or rashes       Assessment & Plan:   Compression fracture To be seen by ortho this week, asking for narcs to cover him till then.  - one time  script for norco   COPD (chronic obstructive pulmonary disease) - continue spiriva + SABA - discussed tobacco cessation = goal down to 1/2 pk/day by next visit - rov 6 mon

## 2011-06-28 ENCOUNTER — Encounter (INDEPENDENT_AMBULATORY_CARE_PROVIDER_SITE_OTHER): Payer: Medicare Other | Admitting: Surgery

## 2011-06-29 ENCOUNTER — Telehealth: Payer: Self-pay | Admitting: *Deleted

## 2011-06-29 DIAGNOSIS — IMO0002 Reserved for concepts with insufficient information to code with codable children: Secondary | ICD-10-CM

## 2011-06-29 DIAGNOSIS — R2681 Unsteadiness on feet: Secondary | ICD-10-CM

## 2011-06-29 NOTE — Telephone Encounter (Signed)
Patients daughter Donalynn Furlong called and left a voice message wanting to know if Dr Rodena Medin would provide a referral to [facility] for patient. She stated that patient is still having problems with his back , and she feels he would benefit from rehab.

## 2011-06-29 NOTE — Telephone Encounter (Signed)
ok 

## 2011-06-29 NOTE — Telephone Encounter (Signed)
Call placed to Steve [name] at [phone], she was informed Home Health referral has been initiated.

## 2011-07-14 ENCOUNTER — Ambulatory Visit (INDEPENDENT_AMBULATORY_CARE_PROVIDER_SITE_OTHER): Payer: Medicare Other | Admitting: Family

## 2011-07-14 ENCOUNTER — Telehealth: Payer: Self-pay | Admitting: Family

## 2011-07-14 ENCOUNTER — Encounter: Payer: Self-pay | Admitting: Family

## 2011-07-14 ENCOUNTER — Other Ambulatory Visit: Payer: Self-pay | Admitting: *Deleted

## 2011-07-14 ENCOUNTER — Ambulatory Visit: Payer: Medicare Other | Admitting: Family

## 2011-07-14 ENCOUNTER — Ambulatory Visit (HOSPITAL_BASED_OUTPATIENT_CLINIC_OR_DEPARTMENT_OTHER)
Admission: RE | Admit: 2011-07-14 | Discharge: 2011-07-14 | Disposition: A | Discharge status: Home / Self Care | Payer: Medicare Other | Source: Ambulatory Visit | Attending: Family | Admitting: Family

## 2011-07-14 VITALS — BP 110/78 | HR 64 | Temp 97.8°F | Resp 16 | Wt 193.1 lb

## 2011-07-14 DIAGNOSIS — R0781 Pleurodynia: Secondary | ICD-10-CM

## 2011-07-14 DIAGNOSIS — R079 Chest pain, unspecified: Secondary | ICD-10-CM

## 2011-07-14 DIAGNOSIS — J189 Pneumonia, unspecified organism: Secondary | ICD-10-CM | POA: Insufficient documentation

## 2011-07-14 DIAGNOSIS — J9819 Other pulmonary collapse: Secondary | ICD-10-CM | POA: Insufficient documentation

## 2011-07-14 DIAGNOSIS — W19XXXA Unspecified fall, initial encounter: Secondary | ICD-10-CM | POA: Insufficient documentation

## 2011-07-14 MED ORDER — HYDROCODONE-ACETAMINOPHEN 10-325 MG PO TABS: 1.0000 | ORAL_TABLET | Freq: Three times a day (TID) | ORAL | Status: DC | PRN

## 2011-07-14 MED ORDER — AZITHROMYCIN 250 MG PO TABS: ORAL_TABLET | ORAL | Status: DC

## 2011-07-14 MED ORDER — LEVOFLOXACIN 500 MG PO TABS: 500.0000 mg | ORAL_TABLET | Freq: Every day | ORAL | Status: DC

## 2011-07-14 MED ORDER — DOXYCYCLINE HYCLATE 100 MG PO TABS: 100.0000 mg | ORAL_TABLET | Freq: Two times a day (BID) | ORAL | Status: DC

## 2011-07-14 MED ORDER — SERTRALINE HCL 50 MG PO TABS: 50.0000 mg | ORAL_TABLET | Freq: Every day | ORAL | Status: DC

## 2011-07-14 MED ORDER — AMOXICILLIN-POT CLAVULANATE 875-125 MG PO TABS: 1.0000 | ORAL_TABLET | Freq: Two times a day (BID) | ORAL | Status: DC

## 2011-07-14 NOTE — Telephone Encounter (Signed)
Spoke with pt. Re:  cxr results.  Will start augmentin and doxy (avoid zitho/levaquin due to drug interaction with zoloft) due to symptoms and right basilar atx.  Pt verbalizes understanding. Recommended that he follow back up in the office early next week with Dr. Rodena Medin.

## 2011-07-14 NOTE — Progress Notes (Signed)
Subjective:    Patient ID: Steve [name], male    DOB: 1933-11-17, 78 y.o.   MRN: [id number]  HPI  [name] is a 78 yr old male who presents today with complaint of right sided lung/rib pain since yesterday.  Hurts to cough.  Yellow phlegm since yesterday.  No fever.  Denies nausea/vomitting.  Had some SOB last night.  Felt that last night he had panic attack and took a xanax which helped. His shortness of breath is at baseline.   He reports that he had a fall 2 weeks ago.  He had an x-ray of the lumbar spine which noted a compression fracture of L1- ? Acute.  He saw Dr. Wynetta Emery for this, and per pt no intervention is planned.  He is using norco prn pain and is requesting a refill of this today.   Review of Systems See HPI  Past Medical History  Diagnosis Date  . Prostate enlargement   . History of chicken pox     childhood  . GERD (gastroesophageal reflux disease)   . Arthritis     knee and back  . AAA (abdominal aortic aneurysm)     stress test 09/14/10 EPIC  . Anxiety   . Dysrhythmia     hx of atrial fib post op x 2 days   . COPD (chronic obstructive pulmonary disease)   . Shortness of breath     with exertion   . Recurrent upper respiratory infection (URI)     productive cough- white phlegm- no fever  . Sleep apnea      12/12 sleep study,SEVERE per study  Dr Delton Coombes- states doesnt wear machine on regular basis- setting BiPAP 9/9  . Hypertension     chest x ray 12/12 EPIC- repeated 06/06/11, EKG 11/12 EPIC    History   Social History  . Marital Status: Married    Spouse Name: N/A    Number of Children: N/A  . Years of Education: N/A   Occupational History  . Not on file.   Social History Main Topics  . Smoking status: Current Everyday Smoker -- 1.0 packs/day    Types: Cigarettes  . Smokeless tobacco: Never Used   Comment: started smoking at age 56.  Currently smoking 1 ppd.  . Alcohol Use: No     no etoh in 28 years   . Drug Use: No  . Sexually Active: Not on file    Other Topics Concern  . Not on file   Social History Narrative  . No narrative on file    Past Surgical History  Procedure Date  . Knee surgery 1957    left knee arthotomy  . Abdominal aortic aneurysm repair 11/14/2010    AAA Repair    Dr Arbie Cookey  . Transurethral resection of prostate 01/25/2011    TURP  . Incisional hernia repair 06/12/2011    Procedure: HERNIA REPAIR INCISIONAL;  Surgeon: Velora Heckler, MD;  Location: [facility];  Service: General;  Laterality: N/A;  Open Repair Ventral Incisional Hernia with Mesh    Family History  Problem Relation Age of Onset  . Arthritis Mother   . Heart failure Mother   . Hypertension Mother   . Diabetes Mother   . Heart failure Daughter     deceased age 63  . Other Daughter     deceased age 29 MVA    Allergies  Allergen Reactions  . Lyrica (Pregabalin) Hives    Current Outpatient Prescriptions on  File Prior to Visit  Medication Sig Dispense Refill  . albuterol (PROVENTIL HFA;VENTOLIN HFA) 108 (90 BASE) MCG/ACT inhaler Inhale 2 puffs into the lungs every 6 (six) hours as needed for wheezing.  1 Inhaler  2  . ALPRAZolam (XANAX) 0.25 MG tablet Take 0.25 mg by mouth at bedtime as needed.      Marland Kitchen guaiFENesin (MUCINEX) 600 MG 12 hr tablet Take 2 tablets (1,200 mg total) by mouth 2 (two) times daily.  6 tablet  0  . metoprolol tartrate (LOPRESSOR) 12.5 mg TABS Take 0.5 tablets (12.5 mg total) by mouth 2 (two) times daily.  30 tablet  0  . Multiple Vitamin (MULTIVITAMIN WITH MINERALS) TABS Take 1 tablet by mouth daily.  30 tablet  0  . senna (SENOKOT) 8.6 MG TABS Take 1 tablet (8.6 mg total) by mouth 2 (two) times daily.  10 tablet  0  . tiotropium (SPIRIVA HANDIHALER) 18 MCG inhalation capsule Place 1 capsule (18 mcg total) into inhaler and inhale daily.  30 capsule  3  . DISCONTD: sertraline (ZOLOFT) 50 MG tablet Take 1 tablet (50 mg total) by mouth daily.  30 tablet  6  . lactobacillus (FLORANEX/LACTINEX) PACK Take 1 packet (1 g total) by  mouth 3 (three) times daily with meals.  9 packet  0    BP 110/78  Pulse 64  Temp 97.8 F (36.6 C) (Oral)  Resp 16  Wt 193 lb 1.3 oz (87.581 kg)  SpO2 97%       Objective:   Physical Exam  Constitutional: He is oriented to person, place, and time. He appears well-developed and well-nourished. No distress.  Cardiovascular: Normal rate and regular rhythm.   No murmur heard. Pulmonary/Chest: Effort normal. He has decreased breath sounds in the right lower field. He has no wheezes. He has rales in the left lower field.  Musculoskeletal: He exhibits no edema.  Neurological: He is alert and oriented to person, place, and time.  Psychiatric: He has a normal mood and affect. His behavior is normal. Judgment and thought content normal.          Assessment & Plan:

## 2011-07-14 NOTE — Telephone Encounter (Signed)
Zoloft rx printed and was called to [facility] at [employer].

## 2011-07-14 NOTE — Patient Instructions (Addendum)
Please complete your cxr on the first floor.

## 2011-07-14 NOTE — Assessment & Plan Note (Addendum)
CXR performed today notes atx at the right base.  Clinically symptoms are consistent with pneumonia.  Will plan to treat with augmentin and doxy (avoid quinolones/zithromax due to risk of prolonged QT) Spoke with pharmacist. No wheezing and sob is at baseline.  Hold off on any steroid medication at this time.  Pt instructed to follow back up in office early next week.

## 2011-07-24 ENCOUNTER — Other Ambulatory Visit: Payer: Self-pay | Admitting: Family

## 2011-07-25 ENCOUNTER — Ambulatory Visit (INDEPENDENT_AMBULATORY_CARE_PROVIDER_SITE_OTHER): Payer: Medicare Other | Admitting: Surgery

## 2011-07-25 ENCOUNTER — Encounter (INDEPENDENT_AMBULATORY_CARE_PROVIDER_SITE_OTHER): Payer: Self-pay | Admitting: Surgery

## 2011-07-25 VITALS — BP 172/72 | HR 60 | Temp 98.0°F | Ht 69.0 in | Wt 191.6 lb

## 2011-07-25 DIAGNOSIS — K432 Incisional hernia without obstruction or gangrene: Secondary | ICD-10-CM

## 2011-07-25 NOTE — Patient Instructions (Signed)
  COCOA BUTTER & VITAMIN E CREAM  (Palmer's or other brand)  Apply cocoa butter/vitamin E cream to your incision 2 - 3 times daily.  Massage cream into incision for one minute with each application.  Use sunscreen (50 SPF or higher) for first 6 months after surgery if area is exposed to sun.  You may substitute Mederma or other scar reducing creams as desired.   

## 2011-07-25 NOTE — Progress Notes (Signed)
General Surgery [facility]  Visit Diagnoses: 1. Incisional hernia     HISTORY: Patient returns for postoperative visit having undergone ventral incisional hernia repair with mesh 6 weeks ago. Patient has discontinued use of his abdominal binder. He is having no problems.  EXAM: Abdominal incision is well-healed. No sign of seroma. With Valsalva and set up maneuver there is no sign of recurrence.  IMPRESSION: Status post ventral incisional hernia repair with mesh  PLAN: Patient will apply topical creams to his incision. He is released to full activity, although I have asked him to avoid heavy lifting. Patient will return to see me in this office as needed.  Velora Heckler, MD, FACS General & Endocrine Surgery [facility]

## 2011-07-26 NOTE — Telephone Encounter (Addendum)
I would not recommend that he continue hydrocodone at this point.  He can use tylenol PRN.  He is also due for follow up with Dr. Rodena Medin for follow up of his pneumonia.

## 2011-07-26 NOTE — Telephone Encounter (Signed)
Please advise re: request below:  Medication name:  Name from pharmacy:  HYDROcodone-acetaminophen (NORCO) 10-325 MG per tablet  HYDROCODONE/APAP 10/325 MG TAB 10MG  The source prescription has been discontinued. Sig: TAKE 1 TABLET BY MOUTH EVERY 8 HOURS AS NEEDED FOR PAIN Dispense: 20 tablet Refills: 0 Start: 07/24/2011 Class: Normal Requested on: 07/14/2011 Originally ordered on: 07/14/2011 Last refill: 07/14/2011

## 2011-07-27 NOTE — Telephone Encounter (Signed)
Left message on home # for pt to return my call. 

## 2011-07-28 NOTE — Telephone Encounter (Signed)
Patients wife, [name] returned phone call. Best # [phone]

## 2011-07-31 NOTE — Telephone Encounter (Signed)
Notified pt's wife and she voices understanding. Transferred her to front office to schedule pt's follow up with Dr Rodena Medin.

## 2011-08-01 ENCOUNTER — Encounter: Payer: Self-pay | Admitting: Neurology

## 2011-08-01 ENCOUNTER — Other Ambulatory Visit (INDEPENDENT_AMBULATORY_CARE_PROVIDER_SITE_OTHER): Payer: Medicare Other

## 2011-08-01 ENCOUNTER — Ambulatory Visit (INDEPENDENT_AMBULATORY_CARE_PROVIDER_SITE_OTHER): Payer: Medicare Other | Admitting: Neurology

## 2011-08-01 ENCOUNTER — Other Ambulatory Visit: Payer: Self-pay | Admitting: Neurology

## 2011-08-01 VITALS — BP 126/68 | HR 60 | Wt 189.0 lb

## 2011-08-01 DIAGNOSIS — R7309 Other abnormal glucose: Secondary | ICD-10-CM

## 2011-08-01 DIAGNOSIS — R269 Unspecified abnormalities of gait and mobility: Secondary | ICD-10-CM

## 2011-08-01 DIAGNOSIS — G609 Hereditary and idiopathic neuropathy, unspecified: Secondary | ICD-10-CM

## 2011-08-01 LAB — CBC WITH DIFFERENTIAL/PLATELET
Basophils Absolute: 0.1 10*3/uL (ref 0.0–0.1)
Basophils Relative: 0.7 % (ref 0.0–3.0)
Hemoglobin: 14.4 g/dL (ref 13.0–17.0)
Lymphocytes Relative: 16.4 % (ref 12.0–46.0)
Monocytes Relative: 5.9 % (ref 3.0–12.0)
Neutro Abs: 9.1 10*3/uL — ABNORMAL HIGH (ref 1.4–7.7)
Neutrophils Relative %: 76.1 % (ref 43.0–77.0)
RBC: 4.52 Mil/uL (ref 4.22–5.81)

## 2011-08-01 LAB — COMPREHENSIVE METABOLIC PANEL
ALT: 22 U/L (ref 0–53)
AST: 17 U/L (ref 0–37)
Albumin: 3.3 g/dL — ABNORMAL LOW (ref 3.5–5.2)
BUN: 17 mg/dL (ref 6–23)
CO2: 28 mEq/L (ref 19–32)
Calcium: 9.6 mg/dL (ref 8.4–10.5)
Chloride: 106 mEq/L (ref 96–112)
Creatinine, Ser: 0.9 mg/dL (ref 0.4–1.5)
GFR: 93.09 mL/min (ref >60.00)
Potassium: 3.9 mEq/L (ref 3.5–5.1)

## 2011-08-01 LAB — VITAMIN B12: Vitamin B-12: 402 pg/mL (ref 211–911)

## 2011-08-01 NOTE — Patient Instructions (Addendum)
Go to the basement to have your labs drawn today.  Your appointment for the nerve conduction studies/electromyelogram is scheduled for Monday, August 12 at 10:00am at [facility] [address] [place], [place] [phone].  Physical Therapy will call you directly to schedule your appointments.  [address] [phone].

## 2011-08-01 NOTE — Progress Notes (Signed)
Dear Dr. Rodena Medin,  Thank you for having me see Steve [name] in consultation today at [facility] for his problem with difficulty with gait as well as spells of possible presyncope.  As you may recall, he is a 77 y.o. year old male with a history of COPD, anxiety and a AAA repair in 2012 who has had progressive difficulty walking over the last two years.  He has had several falls, one resulting in L1 compression fracture about 1 month ago.  He says walking is worse at night, he is more unstable in the shower and on uneven ground.  Denies numbness or tingling in his legs.  Denies change in bladder habits.  He also has had one recent spell of what sounds like presyncope with the sudden onset of tunnel vision that lasted about 3 minutes.  It was followed by some lightheadedness when he stood up.  He denies lightheadedness at other times, although has had a spell of presumed syncope after his hernia surgery.  He was seen by EMS, who did an EKG which revealed a sinus rhythm with a possible LAFB.  BP was 170/90.  Interestingly he noted that he has had orthostatic bp drops in the past.  He has not had any changes to his blood pressure medications.  He did not have chest pain or palpitations with this spell.    Past Medical History  Diagnosis Date  . Prostate enlargement   . History of chicken pox     childhood  . GERD (gastroesophageal reflux disease)   . Arthritis     knee and back  . AAA (abdominal aortic aneurysm)     stress test 09/14/10 EPIC  . Anxiety   . Dysrhythmia     hx of atrial fib post op x 2 days   . COPD (chronic obstructive pulmonary disease)   . Shortness of breath     with exertion   . Recurrent upper respiratory infection (URI)     productive cough- white phlegm- no fever  . Sleep apnea      12/12 sleep study,SEVERE per study  Dr Delton Coombes- states doesnt wear machine on regular basis- setting BiPAP 9/9  . Hypertension     chest x ray 12/12 EPIC- repeated 06/06/11, EKG  11/12 EPIC    Past Surgical History  Procedure Date  . Knee surgery 1957    left knee arthotomy  . Abdominal aortic aneurysm repair 11/14/2010    AAA Repair    Dr Arbie Cookey  . Transurethral resection of prostate 01/25/2011    TURP  . Incisional hernia repair 06/12/2011    Procedure: HERNIA REPAIR INCISIONAL;  Surgeon: Velora Heckler, MD;  Location: [facility];  Service: General;  Laterality: N/A;  Open Repair Ventral Incisional Hernia with Mesh    History   Social History  . Marital Status: Married    Spouse Name: N/A    Number of Children: N/A  . Years of Education: N/A   Social History Main Topics  . Smoking status: Current Everyday Smoker -- 1.0 packs/day    Types: Cigarettes  . Smokeless tobacco: Never Used   Comment: started smoking at age 57.  Currently smoking 1 ppd.  . Alcohol Use: No  . Drug Use: No  . Sexually Active: None   Other Topics Concern  . None   Social History Narrative  . None    Family History  Problem Relation Age of Onset  . Arthritis Mother   .  Heart failure Mother   . Hypertension Mother   . Diabetes Mother   . Heart failure Daughter     deceased age 53  . Other Daughter     deceased age 16 MVA    Current Outpatient Prescriptions on File Prior to Visit  Medication Sig Dispense Refill  . albuterol (PROVENTIL HFA;VENTOLIN HFA) 108 (90 BASE) MCG/ACT inhaler Inhale 2 puffs into the lungs every 6 (six) hours as needed for wheezing.  1 Inhaler  2  . ALPRAZolam (XANAX) 0.25 MG tablet Take 0.25 mg by mouth at bedtime as needed.      . metoprolol tartrate (LOPRESSOR) 12.5 mg TABS Take 0.5 tablets (12.5 mg total) by mouth 2 (two) times daily.  30 tablet  0  . Multiple Vitamin (MULTIVITAMIN WITH MINERALS) TABS Take 1 tablet by mouth daily.  30 tablet  0  . senna (SENOKOT) 8.6 MG TABS Take 1 tablet (8.6 mg total) by mouth 2 (two) times daily.  10 tablet  0  . sertraline (ZOLOFT) 50 MG tablet Take 1 tablet (50 mg total) by mouth daily.  30 tablet  0  .  tiotropium (SPIRIVA HANDIHALER) 18 MCG inhalation capsule Place 1 capsule (18 mcg total) into inhaler and inhale daily.  30 capsule  3    Allergies  Allergen Reactions  . Lyrica (Pregabalin) Hives      ROS:  13 systems were reviewed and are  are unremarkable.   Examination:  Filed Vitals:   08/01/11 0827  BP: 136/70  Pulse: 60  Weight: 189 lb (85.73 kg)     In general, weathered appearing gentleman.  Pulm: uses accessory muscle to breath, distant lung sounds.  Cardiovascular: The patient has a regular rate and rhythm and no carotid bruits.  Fundoscopy:  Disks are flat. Vessel caliber within normal limits.  Mental status:   The patient is oriented to person, place and time. Recent and remote memory are intact. Attention span and concentration are normal. Language including repetition, naming, following commands are intact. Fund of knowledge of current and historical events, as well as vocabulary are normal.  Cranial Nerves: Pupils are equally round and reactive to light. Visual fields full to confrontation. Extraocular movements are intact without nystagmus. Facial sensation and muscles of mastication are intact. Muscles of facial expression are symmetric. Hearing intact to bilateral finger rub. Tongue protrusion, uvula, palate midline.  Shoulder shrug intact  Motor:  The patient has normal bulk and tone, no pronator drift.  There are no adventitious movements.  5/5 muscle strength bilaterally.  Reflexes:  1+ UE, absent lowers.  Toes down  Coordination:  Normal finger to nose.  No dysdiadokinesia. Normal H2S  Sensation is impaired, with absent vibration at ankles and toes, temperature gradient on legs normalizing near high calf.  Position seems intact at feet.  Gait and Station are mildly wide based.  Romberg+.  Impression/Recs: 1.  Gait disorder - likely secondary to distal symmetric peripheral neuropathy.  I will do EMG/NCS to also look for signs of  radiculopathy but I think this is less likely given his unremarkable MRI L-spine without contrast in 2012 and the fact that his L-spine fracture was at L1 and his reflexes are decreased.  I will also get PN labs.  I am going to send him to gait therapy at the neurorehab center. 2.  Spell of tunnel vision - it sounds like presyncope.  Given it occurred while sitting, I think that cardiac arrythmia still needs to be high on  the list.  I will leave this up to you, but you may want to order a ambulatory cardiac monitor to see if he is having a rhythm problem.    We will see the patient back after his EMG/NCS.  Thank you for having [place] see Steve [name] in consultation.  Feel free to contact me with any questions.  Lupita Raider Modesto Charon, MD [facility], [place] [address] [place], [place] [address] Phone: [phone] Fax: [phone].

## 2011-08-03 LAB — SPEP & IFE WITH QIG
Albumin ELP: 49.9 % — ABNORMAL LOW (ref 55.8–66.1)
Beta 2: 4.1 % (ref 3.2–6.5)
IgA: 1050 mg/dL — ABNORMAL HIGH (ref 68–379)
IgM, Serum: 75 mg/dL (ref 41–251)

## 2011-08-03 LAB — METHYLMALONIC ACID, SERUM: Methylmalonic Acid, Quant: 0.21 umol/L (ref <0.40)

## 2011-08-04 ENCOUNTER — Encounter: Payer: Self-pay | Admitting: Internal Medicine

## 2011-08-04 ENCOUNTER — Ambulatory Visit (INDEPENDENT_AMBULATORY_CARE_PROVIDER_SITE_OTHER): Payer: Medicare Other | Admitting: Internal Medicine

## 2011-08-04 VITALS — BP 128/88 | HR 53 | Temp 97.8°F | Resp 16 | Wt 188.8 lb

## 2011-08-04 DIAGNOSIS — R55 Syncope and collapse: Secondary | ICD-10-CM | POA: Insufficient documentation

## 2011-08-04 DIAGNOSIS — I4891 Unspecified atrial fibrillation: Secondary | ICD-10-CM

## 2011-08-04 NOTE — Progress Notes (Signed)
  Subjective:    Patient ID: Steve [name], male    DOB: 1933-01-17, 78 y.o.   MRN: [id number]  HPI Pt presents to clinic for evaluation of presyncope. Last week had episode of near syncope that occurred while sitting watching tv. No change in position and preceeding sx's. Denies palpitations or neurologic associated sx's. Has bilateral lower extremity neuropathy followed by neurology s/p NCS. Now enrolled in gait therapy. C/o fatigue. Reviewed recent neurology labs.   Past Medical History  Diagnosis Date  . Prostate enlargement   . History of chicken pox     childhood  . GERD (gastroesophageal reflux disease)   . Arthritis     knee and back  . AAA (abdominal aortic aneurysm)     stress test 09/14/10 EPIC  . Anxiety   . Dysrhythmia     hx of atrial fib post op x 2 days   . COPD (chronic obstructive pulmonary disease)   . Shortness of breath     with exertion   . Recurrent upper respiratory infection (URI)     productive cough- white phlegm- no fever  . Sleep apnea      12/12 sleep study,SEVERE per study  Dr Delton Coombes- states doesnt wear machine on regular basis- setting BiPAP 9/9  . Hypertension     chest x ray 12/12 EPIC- repeated 06/06/11, EKG 11/12 EPIC   Past Surgical History  Procedure Date  . Knee surgery 1957    left knee arthotomy  . Abdominal aortic aneurysm repair 11/14/2010    AAA Repair    Dr Arbie Cookey  . Transurethral resection of prostate 01/25/2011    TURP  . Incisional hernia repair 06/12/2011    Procedure: HERNIA REPAIR INCISIONAL;  Surgeon: Velora Heckler, MD;  Location: [facility];  Service: General;  Laterality: N/A;  Open Repair Ventral Incisional Hernia with Mesh    reports that he has been smoking Cigarettes.  He has been smoking about 1 pack per day. He has never used smokeless tobacco. He reports that he does not drink alcohol or use illicit drugs. family history includes Arthritis in his mother; Diabetes in his mother; Heart failure in his daughter and mother;  Hypertension in his mother; and Other in his daughter. Allergies  Allergen Reactions  . Lyrica (Pregabalin) Hives     Review of Systems see hpi     Objective:   Physical Exam  Nursing note and vitals reviewed. Constitutional: He appears well-developed and well-nourished. No distress.  HENT:  Head: Normocephalic and atraumatic.  Eyes: Conjunctivae are normal. No scleral icterus.  Neck: Neck supple. No JVD present.  Cardiovascular: Normal rate, regular rhythm and normal heart sounds.  Exam reveals no gallop and no friction rub.   No murmur heard. Pulmonary/Chest: Effort normal and breath sounds normal. No respiratory distress. He has no wheezes. He has no rales.  Neurological: He is alert.  Skin: He is not diaphoretic.  Psychiatric: He has a normal mood and affect.          Assessment & Plan:

## 2011-08-04 NOTE — Assessment & Plan Note (Signed)
Not associated with postural change. H/o PAF. Schedule event monitor. Reviewed recent echo with nl EF and no significant valvular disease

## 2011-08-04 NOTE — Patient Instructions (Addendum)
We are in the process of scheduling your heart (event) monitor

## 2011-08-10 ENCOUNTER — Other Ambulatory Visit: Payer: Self-pay | Admitting: Internal Medicine

## 2011-08-10 DIAGNOSIS — I82409 Acute embolism and thrombosis of unspecified deep veins of unspecified lower extremity: Secondary | ICD-10-CM

## 2011-08-10 DIAGNOSIS — R778 Other specified abnormalities of plasma proteins: Secondary | ICD-10-CM

## 2011-08-10 HISTORY — DX: Acute embolism and thrombosis of unspecified deep veins of unspecified lower extremity: I82.409

## 2011-08-11 ENCOUNTER — Telehealth: Payer: Self-pay | Admitting: *Deleted

## 2011-08-11 ENCOUNTER — Telehealth: Payer: Self-pay | Admitting: Neurology

## 2011-08-11 NOTE — Telephone Encounter (Signed)
Message copied by Benay Spice on Fri Aug 11, 2011  1:53 PM ------      Message from: Milas Gain      Created: Thu Aug 10, 2011  4:57 PM       Jan - Can you let Steve [name] know that his labs looked ok, except for a slightly abnormal protein in his blood.  I have relayed this to Dr. Rodena Medin in this note as well as Dr. Rodena Medin may want him to see a hematologist about this.  Tell him I am waiting for his EMG/NCS study, and then will get back to him.            Dr. 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  Not sure if you follow these yourself or send them to a hematologist but I will leave that up to you.  I am going to bring him back after his NCS to determine next steps from a neurologic point of view as some of these can respond to immunotherapy.

## 2011-08-11 NOTE — Telephone Encounter (Signed)
Spoke with Dewayne Hatch, the patient's wife. Information given as per Dr. Modesto Charon below. They will f/u with Dr. Rodena Medin re the abnormal lab result. No other concerns voiced at this time.

## 2011-08-11 NOTE — Telephone Encounter (Signed)
Patient's wife had already been contacted by Dr. Nash Dimmer office about this matter. Explained about Hematologist working inside [facility] [employer] not to surprise them when they received call with appointment information/SLS  ===View-only below this line===  ----- Message -----    From: Edwyna Perfect, MD    Sent: 08/10/2011   5:06 PM      To: Regis Bill, CMA  pls let mr Belcastro know i reviewed one of his labs from neurology. There is a mild elevation in the level of a protein in his blood. The level is not very high and may not amount to anything but needs to be evaluated by a blood specialist to be sure. Will place referral order for hematology

## 2011-08-15 ENCOUNTER — Telehealth: Payer: Self-pay | Admitting: Hematology & Oncology

## 2011-08-15 ENCOUNTER — Encounter (INDEPENDENT_AMBULATORY_CARE_PROVIDER_SITE_OTHER): Payer: Medicare Other

## 2011-08-15 DIAGNOSIS — R55 Syncope and collapse: Secondary | ICD-10-CM

## 2011-08-15 DIAGNOSIS — I4891 Unspecified atrial fibrillation: Secondary | ICD-10-CM

## 2011-08-15 DIAGNOSIS — R002 Palpitations: Secondary | ICD-10-CM

## 2011-08-15 NOTE — Telephone Encounter (Signed)
Confirmed 09/14/11 appointment time and date with patient's wife.

## 2011-08-18 ENCOUNTER — Ambulatory Visit: Payer: Medicare Other | Attending: Neurology | Admitting: *Deleted

## 2011-08-18 DIAGNOSIS — IMO0001 Reserved for inherently not codable concepts without codable children: Secondary | ICD-10-CM | POA: Insufficient documentation

## 2011-08-18 DIAGNOSIS — R269 Unspecified abnormalities of gait and mobility: Secondary | ICD-10-CM | POA: Insufficient documentation

## 2011-08-18 DIAGNOSIS — M6281 Muscle weakness (generalized): Secondary | ICD-10-CM | POA: Insufficient documentation

## 2011-08-21 ENCOUNTER — Other Ambulatory Visit: Payer: Self-pay | Admitting: Emergency Medicine

## 2011-08-28 ENCOUNTER — Encounter: Payer: Medicare Other | Admitting: *Deleted

## 2011-08-28 NOTE — Progress Notes (Signed)
EMG/NCS revealed chronic L4-L5, L5-S1 radiculopathies on the right.  No PN seen.

## 2011-08-29 ENCOUNTER — Encounter: Payer: Medicare Other | Admitting: *Deleted

## 2011-09-01 ENCOUNTER — Encounter (HOSPITAL_COMMUNITY): Payer: Self-pay | Admitting: Emergency Medicine

## 2011-09-01 ENCOUNTER — Ambulatory Visit (INDEPENDENT_AMBULATORY_CARE_PROVIDER_SITE_OTHER): Payer: Medicare Other | Admitting: Neurology

## 2011-09-01 ENCOUNTER — Encounter: Payer: Self-pay | Admitting: Neurology

## 2011-09-01 ENCOUNTER — Telehealth: Payer: Self-pay | Admitting: *Deleted

## 2011-09-01 ENCOUNTER — Ambulatory Visit (HOSPITAL_COMMUNITY)
Admission: RE | Admit: 2011-09-01 | Discharge: 2011-09-01 | Disposition: A | Discharge status: Home / Self Care | Payer: Medicare Other | Source: Ambulatory Visit | Attending: Neurology | Admitting: Neurology

## 2011-09-01 ENCOUNTER — Emergency Department (HOSPITAL_COMMUNITY)
Admission: EM | Admit: 2011-09-01 | Discharge: 2011-09-01 | Disposition: A | Discharge status: Home / Self Care | Payer: Medicare Other | Attending: Emergency Medicine | Admitting: Emergency Medicine

## 2011-09-01 VITALS — BP 138/68 | HR 62 | Wt 187.0 lb

## 2011-09-01 DIAGNOSIS — I82409 Acute embolism and thrombosis of unspecified deep veins of unspecified lower extremity: Secondary | ICD-10-CM

## 2011-09-01 DIAGNOSIS — I10 Essential (primary) hypertension: Secondary | ICD-10-CM | POA: Insufficient documentation

## 2011-09-01 DIAGNOSIS — K219 Gastro-esophageal reflux disease without esophagitis: Secondary | ICD-10-CM | POA: Insufficient documentation

## 2011-09-01 DIAGNOSIS — M7989 Other specified soft tissue disorders: Secondary | ICD-10-CM | POA: Insufficient documentation

## 2011-09-01 DIAGNOSIS — M79609 Pain in unspecified limb: Secondary | ICD-10-CM

## 2011-09-01 DIAGNOSIS — R200 Anesthesia of skin: Secondary | ICD-10-CM

## 2011-09-01 DIAGNOSIS — M79669 Pain in unspecified lower leg: Secondary | ICD-10-CM

## 2011-09-01 DIAGNOSIS — Z9181 History of falling: Secondary | ICD-10-CM

## 2011-09-01 DIAGNOSIS — F17200 Nicotine dependence, unspecified, uncomplicated: Secondary | ICD-10-CM | POA: Insufficient documentation

## 2011-09-01 DIAGNOSIS — M129 Arthropathy, unspecified: Secondary | ICD-10-CM | POA: Insufficient documentation

## 2011-09-01 DIAGNOSIS — G4730 Sleep apnea, unspecified: Secondary | ICD-10-CM | POA: Insufficient documentation

## 2011-09-01 DIAGNOSIS — J449 Chronic obstructive pulmonary disease, unspecified: Secondary | ICD-10-CM | POA: Insufficient documentation

## 2011-09-01 DIAGNOSIS — R296 Repeated falls: Secondary | ICD-10-CM

## 2011-09-01 DIAGNOSIS — R209 Unspecified disturbances of skin sensation: Secondary | ICD-10-CM

## 2011-09-01 DIAGNOSIS — J4489 Other specified chronic obstructive pulmonary disease: Secondary | ICD-10-CM | POA: Insufficient documentation

## 2011-09-01 DIAGNOSIS — I824Z9 Acute embolism and thrombosis of unspecified deep veins of unspecified distal lower extremity: Secondary | ICD-10-CM | POA: Insufficient documentation

## 2011-09-01 DIAGNOSIS — I824Y9 Acute embolism and thrombosis of unspecified deep veins of unspecified proximal lower extremity: Secondary | ICD-10-CM | POA: Insufficient documentation

## 2011-09-01 LAB — CBC WITH DIFFERENTIAL/PLATELET
Basophils Absolute: 0 10*3/uL (ref 0.0–0.1)
Basophils Relative: 0 % (ref 0–1)
Eosinophils Relative: 2 % (ref 0–5)
HCT: 40.6 % (ref 39.0–52.0)
MCH: 32.3 pg (ref 26.0–34.0)
MCHC: 35 g/dL (ref 30.0–36.0)
MCV: 92.5 fL (ref 78.0–100.0)
Monocytes Absolute: 0.6 10*3/uL (ref 0.1–1.0)
RDW: 14.6 % (ref 11.5–15.5)

## 2011-09-01 LAB — BASIC METABOLIC PANEL
CO2: 26 mEq/L (ref 19–32)
Calcium: 10 mg/dL (ref 8.4–10.5)
Creatinine, Ser: 0.79 mg/dL (ref 0.50–1.35)

## 2011-09-01 LAB — PROTIME-INR: Prothrombin Time: 13.7 seconds (ref 11.6–15.2)

## 2011-09-01 MED ORDER — RIVAROXABAN 15 MG PO TABS: 15.0000 mg | ORAL_TABLET | Freq: Two times a day (BID) | ORAL | Status: DC

## 2011-09-01 MED ORDER — RIVAROXABAN 15 MG PO TABS
15.0000 mg | ORAL_TABLET | Freq: Every day | ORAL | Status: DC
  Administered 2011-09-01: 15 mg via ORAL
  Filled 2011-09-01: qty 1

## 2011-09-01 NOTE — ED Notes (Signed)
Alert, NAD, calm, interactive, sitting in personal walker/chair, waiting on disposition/plan, wife at [facility], denies pain.

## 2011-09-01 NOTE — Telephone Encounter (Signed)
Steve [name] w/[facility] called to report that patient was at facility with a Positive DVT result and they were unable to reach anyone at Dr. Nash Dimmer office, so therefore, they placed call to PCP. Per Dr. Rodena Medin, caller was instructed to have patient report to [facility] ED for A&E and Tx necessary/SLS

## 2011-09-01 NOTE — ED Provider Notes (Signed)
History     CSN: [id number]  Arrival date & time 09/01/11  1431   First MD Initiated Contact with Patient 09/01/11 1750      Chief Complaint  Patient presents with  . DVT    HPI Pt has been having swelling in his leg since Wednesday.  He denies any chest pain or shortness of breath.  Pt was seen by his neurologist today who ordered a DVT study.  Pt was told that it was positive so he should come to the ED.    Pt denies any other complaints.  Past Medical History  Diagnosis Date  . Prostate enlargement   . History of chicken pox     childhood  . GERD (gastroesophageal reflux disease)   . Arthritis     knee and back  . AAA (abdominal aortic aneurysm)     stress test 09/14/10 [facility]  . Anxiety   . Dysrhythmia     hx of atrial fib post op x 2 days   . COPD (chronic obstructive pulmonary disease)   . Shortness of breath     with exertion   . Recurrent upper respiratory infection (URI)     productive cough- white phlegm- no fever  . Sleep apnea      12/12 sleep study,SEVERE per study  Dr Delton Coombes- states doesnt wear machine on regular basis- setting BiPAP 9/9  . Hypertension     chest x ray 12/12 EP[facility]- repeated 06/06/11, EKG 11/12 [facility]    Past Surgical History  Procedure Date  . Knee surgery 1957    left knee arthotomy  . Abdominal aortic aneurysm repair 11/14/2010    AAA Repair    Dr Arbie Cookey  . Transurethral resection of prostate 01/25/2011    TURP  . Incisional hernia repair 06/12/2011    Procedure: HERNIA REPAIR INCISIONAL;  Surgeon: Velora Heckler, MD;  Location: [facility];  Service: General;  Laterality: N/A;  Open Repair Ventral Incisional Hernia with Mesh    Family History  Problem Relation Age of Onset  . Arthritis Mother   . Heart failure Mother   . Hypertension Mother   . Diabetes Mother   . Heart failure Daughter     deceased age 4  . Other Daughter     deceased age 18 MVA    History  Substance Use Topics  . Smoking status: Current Everyday Smoker -- 1.0  packs/day    Types: Cigarettes  . Smokeless tobacco: Never Used   Comment: started smoking at age 39.  Currently smoking 1 ppd.  . Alcohol Use: No      Review of Systems  All other systems reviewed and are negative.    Allergies  Lyrica  Home Medications   Current Outpatient Rx  Name Route Sig Dispense Refill  . ALBUTEROL SULFATE HFA 108 (90 BASE) MCG/ACT IN AERS Inhalation Inhale 2 puffs into the lungs every 6 (six) hours as needed for wheezing. 1 Inhaler 2  . ALPRAZOLAM 0.25 MG PO TABS Oral Take 0.25 mg by mouth at bedtime as needed.    Marland Kitchen METOPROLOL TARTRATE 12.5 MG HALF TABLET Oral Take 0.5 tablets (12.5 mg total) by mouth 2 (two) times daily. 30 tablet 0  . ADULT MULTIVITAMIN W/MINERALS CH Oral Take 1 tablet by mouth daily. 30 tablet 0  . SENNA 8.6 MG PO TABS Oral Take 1 tablet (8.6 mg total) by mouth 2 (two) times daily. 10 tablet 0  . SERTRALINE HCL 50 MG PO TABS Oral  Take 1 tablet (50 mg total) by mouth daily. 30 tablet 0  . TIOTROPIUM BROMIDE MONOHYDRATE 18 MCG IN CAPS Inhalation Place 18 mcg into inhaler and inhale daily.      BP 127/64  Pulse 60  Temp 97.8 F (36.6 C) (Oral)  Resp 16  SpO2 96%  Physical Exam  Nursing note and vitals reviewed. Constitutional: He appears well-developed and well-nourished. No distress.  HENT:  Head: Normocephalic and atraumatic.  Right Ear: External ear normal.  Left Ear: External ear normal.  Eyes: Conjunctivae are normal. Right eye exhibits no discharge. Left eye exhibits no discharge. No scleral icterus.  Neck: Neck supple. No tracheal deviation present.  Cardiovascular: Normal rate, regular rhythm and intact distal pulses.   Pulmonary/Chest: Effort normal and breath sounds normal. No stridor. No respiratory distress. He has no wheezes. He has no rales.  Abdominal: Soft. Bowel sounds are normal. He exhibits no distension. There is no tenderness. There is no rebound and no guarding.  Musculoskeletal: He exhibits edema and  tenderness.       Right lower extrem, foot and calf with ttp and edema  Neurological: He is alert. He has normal strength. No sensory deficit. Cranial nerve deficit:  no gross defecits noted. He exhibits normal muscle tone. He displays no seizure activity. Coordination normal.  Skin: Skin is warm and dry. No rash noted.  Psychiatric: He has a normal mood and affect.    ED Course  Procedures (including critical care time)  Vascular Ultrasound  Lower extremity venous duplex has been completed.  Right= DVT involving the right femoral vein, popliteal vein, and posterior tibial veins.  Left= no evidence of DVT.  Farrel Demark, RDMS, RVT     Labs Reviewed  CBC WITH DIFFERENTIAL - Abnormal; Notable for the following:    WBC 12.5 (*)     Neutro Abs 9.6 (*)     All other components within normal limits  BASIC METABOLIC PANEL - Abnormal; Notable for the following:    GFR calc non Af Amer 85 (*)     All other components within normal limits  PROTIME-INR   No results found.   1. DVT (deep venous thrombosis)       MDM  Patient had an outpatient vascular study.  Patient denies any chest pain or shortness of breath. Is not having any bleeding. I discussed treatment options and the patient would like to try xarelto.  Will give a dose in the ED and send home on 15 mg bid for the first 21 days.        Celene Kras, MD 09/01/11 [phone]

## 2011-09-01 NOTE — Progress Notes (Signed)
*  PRELIMINARY RESULTS* Vascular Ultrasound Lower extremity venous duplex has been completed.   Right= DVT involving the right femoral vein, popliteal vein, and posterior tibial veins. Left= no evidence of DVT.  Farrel Demark, RDMS, RVT   09/01/2011, 1:34 PM

## 2011-09-01 NOTE — ED Notes (Signed)
Pharmacy contacted for med (xarelto).

## 2011-09-01 NOTE — ED Notes (Signed)
Pt sent here from vascular with DVT to right leg; pt c/o pain and swelling

## 2011-09-01 NOTE — Progress Notes (Signed)
Dear Dr. Rodena Medin,  I saw  Riki Altes back in [facility] for his problem with difficulty with gait as well as spells of possible presyncope.  As you may recall, he is a 78 y.o. year old male with a history of COPD and AAA repair who has had progressive difficulty walking over the last tow years.  He has had several falls, including one resulting in an L1 compression fracture about 1 month ago.He says walking is worse at night, he is more unstable in the shower and on uneven ground. Denies numbness or tingling in his legs. Denies change in bladder habits.   He also has had one recent spell of what sounds like presyncope with the sudden onset of tunnel vision that lasted about 3 minutes. It was followed by some lightheadedness when he stood up. He denies lightheadedness at other times, although has had a spell of presumed syncope after his hernia surgery. He was seen by EMS, who did an EKG which revealed a sinus rhythm with a possible LAFB. BP was 170/90. Interestingly he noted that he has had orthostatic bp drops in the past. He has not had any changes to his blood pressure medications. He did not have chest pain or palpitations with this spell.   ----------------------------  At his last visit I ordered an EMG/NCS as well as PN labs.  PN labs revealed an IgA MGUS, but NCS did not reveal a peripheral neuropathy.  His right sided EMG did reveal a chronic right L4-L5 and  L5-S1 radiculopathy.  Since the EMG he has had some problems with increasing foot numbness on the right as well as leg pain.  He has had two other falls.  His falls seem to involve him falling to the right.  I see that he has currently has a Holter monitor for his presyncope spell as well as you have set him up to see Dr. Myna Hidalgo for his MGUS.  Medical history, social history, and family history were reviewed and have not changed since the last clinic visit.    Current Outpatient Prescriptions on File Prior to  Visit  Medication Sig Dispense Refill  . albuterol (PROVENTIL HFA;VENTOLIN HFA) 108 (90 BASE) MCG/ACT inhaler Inhale 2 puffs into the lungs every 6 (six) hours as needed for wheezing.  1 Inhaler  2  . ALPRAZolam (XANAX) 0.25 MG tablet Take 0.25 mg by mouth at bedtime as needed.      . metoprolol tartrate (LOPRESSOR) 12.5 mg TABS Take 0.5 tablets (12.5 mg total) by mouth 2 (two) times daily.  30 tablet  0  . Multiple Vitamin (MULTIVITAMIN WITH MINERALS) TABS Take 1 tablet by mouth daily.  30 tablet  0  . senna (SENOKOT) 8.6 MG TABS Take 1 tablet (8.6 mg total) by mouth 2 (two) times daily.  10 tablet  0  . sertraline (ZOLOFT) 50 MG tablet Take 1 tablet (50 mg total) by mouth daily.  30 tablet  0  . SPIRIVA HANDIHALER 18 MCG inhalation capsule INHALE ONE DOSE BY MOUTH EVERY DAY  30 each  0    Allergies  Allergen Reactions  . Lyrica (Pregabalin) Hives    ROS:  13 systems were reviewed and  are unremarkable.  Exam: . Filed Vitals:   09/01/11 1056  BP: 138/68  Pulse: 62  Weight: 187 lb (84.823 kg)    In general, weathered appearing gentleman.  Ext:  swelling of right lower leg with tenderness of the calf.  Cranial Nerves: Extraocular  movements are intact without nystagmus. Facial sensation and muscles of mastication are intact. Muscles of facial expression are symmetric. Tongue protrusion, uvula, palate midline.  Shoulder shrug intact  Motor:  Normal bulk and tone, no drift and 5/5 muscle strength bilaterally.  Reflexes:  1+ thoughout UE, absent lowers, toes down.  Sensation:  absent in right foot to vibration, decreased in left foot.  position normal left, decreased right.  pp ?L5 on right.  Also length dep temperature loss in UE and LE.  Coordination:  Normal finger to nose  Gait:  Mildly wide based, some antalgia when putting weight on right leg.  Romberg - today.  Impression/Recommendations:  1.  Right leg pain, numbness - I am concerned because of his swelling in his  right lower leg.  This may be from his falls, but I will get a DVT u/s in the right lower leg.   2.  Gait instability - it appears that the issue here is not neuropathy, making the IgA MGUS likely unrelated.  I am going to repeat an MRI of his L-spine to look for any sign of nerve root impingement coinciding with the EMG findings.  It is possible that his gait instability, which is not horrible could be due to a radiculopathy of the right leg(or bilateral ones for that manner).  However, it does appear on exam that the sensation to his right leg is more affected. 3.  Presyncope - f/u results of Holter.  We will see the patient back in 3 weeks.  Lupita Raider Modesto Charon, MD [facility], [place]

## 2011-09-01 NOTE — Telephone Encounter (Signed)
Thanks for letting me know!

## 2011-09-01 NOTE — Research (Signed)
DiET study to investigate the diagnostic utility of D-dimer in ruling out the presence of VTE discussed with patient.  All questions and concerns were addressed and answered prior to obtaining informed consent.  No study procedures were initiated proir to obtaining informed consent.  A copy of the signed consent was provided to the subject.  Please contact Dr. Chancy Milroy or study coordinator for any questions or concerns.

## 2011-09-01 NOTE — Patient Instructions (Addendum)
Go across the street to [facility] now for your doppler ultrasound, First floor admitting.  Your MRI is scheduled for Tuesday, August 27 at 3:00pm.  Please arrive to [facility], first floor admitting by 2:45pm.  [phone].

## 2011-09-04 ENCOUNTER — Encounter: Payer: Self-pay | Admitting: Neurology

## 2011-09-04 ENCOUNTER — Ambulatory Visit: Payer: Medicare Other | Admitting: *Deleted

## 2011-09-05 ENCOUNTER — Inpatient Hospital Stay (HOSPITAL_COMMUNITY): Admission: RE | Admit: 2011-09-05 | Payer: Medicare Other | Source: Ambulatory Visit

## 2011-09-06 ENCOUNTER — Ambulatory Visit: Payer: Medicare Other | Admitting: *Deleted

## 2011-09-07 ENCOUNTER — Other Ambulatory Visit: Payer: Self-pay | Admitting: Internal Medicine

## 2011-09-07 MED ORDER — SERTRALINE HCL 50 MG PO TABS: 50.0000 mg | ORAL_TABLET | Freq: Every day | ORAL | Status: DC

## 2011-09-12 ENCOUNTER — Ambulatory Visit: Payer: Medicare Other | Attending: Neurology | Admitting: *Deleted

## 2011-09-12 DIAGNOSIS — R269 Unspecified abnormalities of gait and mobility: Secondary | ICD-10-CM | POA: Insufficient documentation

## 2011-09-12 DIAGNOSIS — IMO0001 Reserved for inherently not codable concepts without codable children: Secondary | ICD-10-CM | POA: Insufficient documentation

## 2011-09-12 DIAGNOSIS — M6281 Muscle weakness (generalized): Secondary | ICD-10-CM | POA: Insufficient documentation

## 2011-09-14 ENCOUNTER — Other Ambulatory Visit (HOSPITAL_BASED_OUTPATIENT_CLINIC_OR_DEPARTMENT_OTHER): Payer: Medicare Other | Admitting: Lab

## 2011-09-14 ENCOUNTER — Ambulatory Visit (HOSPITAL_BASED_OUTPATIENT_CLINIC_OR_DEPARTMENT_OTHER): Payer: Medicare Other | Admitting: Hematology & Oncology

## 2011-09-14 ENCOUNTER — Ambulatory Visit: Payer: Medicare Other

## 2011-09-14 VITALS — BP 152/64 | HR 52 | Temp 97.5°F | Resp 22 | Ht 69.0 in | Wt 186.0 lb

## 2011-09-14 DIAGNOSIS — D472 Monoclonal gammopathy: Secondary | ICD-10-CM

## 2011-09-14 DIAGNOSIS — R05 Cough: Secondary | ICD-10-CM

## 2011-09-14 DIAGNOSIS — G63 Polyneuropathy in diseases classified elsewhere: Secondary | ICD-10-CM

## 2011-09-14 DIAGNOSIS — Z77090 Contact with and (suspected) exposure to asbestos: Secondary | ICD-10-CM

## 2011-09-14 DIAGNOSIS — I82409 Acute embolism and thrombosis of unspecified deep veins of unspecified lower extremity: Secondary | ICD-10-CM

## 2011-09-14 DIAGNOSIS — R634 Abnormal weight loss: Secondary | ICD-10-CM

## 2011-09-14 LAB — CBC WITH DIFFERENTIAL (CANCER CENTER ONLY)
BASO#: 0 10*3/uL (ref 0.0–0.2)
EOS%: 1.8 % (ref 0.0–7.0)
HGB: 14 g/dL (ref 13.0–17.1)
LYMPH%: 20.6 % (ref 14.0–48.0)
MCH: 32.3 pg (ref 28.0–33.4)
MCHC: 34.1 g/dL (ref 32.0–35.9)
MONO%: 6.3 % (ref 0.0–13.0)
NEUT#: 7 10*3/uL — ABNORMAL HIGH (ref 1.5–6.5)
NEUT%: 71.1 % (ref 40.0–80.0)

## 2011-09-14 NOTE — Progress Notes (Signed)
This office note has been dictated.

## 2011-09-14 NOTE — Progress Notes (Signed)
CC:   Marguarite Arbour, MD Denton Meek, MD  DIAGNOSES: 1. Monoclonal gammopathy of unknown significance. 2. Deep vein thrombosis of the right leg.  HISTORY OF PRESENT ILLNESS:  Mr. Milliron is a nice 78 year old white gentleman.  He is followed by Dr. Rodena Medin.  He has multiple issues.  He has been dealing with this "neuropathy."  He has seen Dr. Modesto Charon of [facility].  Dr. Modesto Charon saw him recently.  He did have EMG studies. EMG studies showed chronic right lumbar radiculopathy at L4-L5 and L5- S1.  He had lab work done.  This apparently showed a monoclonal spike.  This was an M spike of 0.82 g/dL.  His IgA level was elevated at 1050 mg/dL.  Dr. Modesto Charon noted that his right leg was swollen.  He subsequently underwent a Doppler.  Surprisingly enough, the Doppler did show that he had an extensive thrombus in the right leg from the right common femoral down the right posterior tibial vein.  He was placed on Xarelto 50 mg p.o. b.i.d.  It was felt that he needed to see Hematology so that we could work up this monoclonal spike and also the thromboembolic event.  Mr. Milles has lost at least 20 pounds over the past year.  He has never had a colonoscopy.  He denies any change in bowel or bladder habits.  He has smoked for about 60 years.  He had a little bit of a cough.  There is no hemoptysis.  His last chest x-ray was back in June.  Everything looked okay on the chest x-ray.  He did have a CT angiogram of the chest back in June.  This showed no pulmonary embolus.  There was some atelectasis.  He did have an MRI of his spine a year ago.  This was of his lumbar spine.  This did not show any obvious osseous issues.  There were no lytic lesions a year ago.  He has some disk degeneration at L5-S1.  He also was found to have an aneurysm in the abdominal aorta.  He did have AAA repair) a year ago.  He then had a hernia repair a few months ago.  He has not had any problem with recurrent bouts  of infection.  He does state some unsteadiness when he walks.  He has had no dysphagia or odynophagia.  He has had some occasional abdominal discomfort.  He has had no nausea.  His appetite is somewhat down.  Again, it was felt that hematologic evaluation was needed to try to uncover any issues that might be related to his thromboembolic event and his monoclonal spike.  He did have a vitamin B12 level back in July. This was normal at 402.  PAST MEDICAL HISTORY: 1. COPD. 2. AAA repair. 3. BPH status post TURP. 4. Hypertension. 5. Transient atrial fibrillation. 6. Sleep apnea. 7. Aortic stenosis. 8. Depression.  ALLERGIES:  Lyrica.  MEDICATIONS:  Albuterol inhaler 2 puffs q.6 hours p.r.n., Xanax 0.25 mg p.o. q.8 hours p.r.n., Lopressor 12.5 mg p.o. b.i.d., Xarelto 15 mg p.o. b.i.d., Zoloft 50 mg p.o. daily, Spiriva inhaler 1 puff daily.  SOCIAL HISTORY:  Remarkable for tobacco use.  He probably has about a 60- 70 pack-history of tobacco use.  He still smokes about a pack a day. There is no alcohol use.  He does have occupational exposures to asbestosis.  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  FAMILY HISTORY:  Relatively noncontributory.  There  is no history of thromboembolic events in the family.  REVIEW OF SYSTEMS:  As stated in history of present illness.  No additional findings are noted.  PHYSICAL EXAMINATION:  General:  This is a fairly well-developed, well- nourished, white gentleman in no obvious distress.  Vital Signs:  97.5, pulse 52, respiratory rate 22, blood pressure 152/64.  Weight is 186. Head and Neck:  Normocephalic, atraumatic skull.  There are no ocular or oral lesions.  There are no palpable cervical or supraclavicular lymph nodes.  Lungs:  Scattered crackles bilaterally.  He has some scattered wheezes.  He has decent breath sounds bilaterally.  Cardiac:  Regular rate and rhythm with occasional extra beat.  He  has a 2/6 systolic ejection murmur.  Abdomen:  Soft.  He has decent bowel sounds.  He has laparotomy scars from his past surgeries.  He has no obvious fluid wave. There is no palpable hepatosplenomegaly.  Back:  No tenderness over the spine, ribs, or hips.  Extremities:  No clubbing, cyanosis, or edema. He does have some trace nonpitting edema of the right lower leg.  He has good pulses in his distal extremities.  He has good range of motion of his joints.  No venous cords noted in his legs.  Skin:  No ecchymosis or petechia.  Neurological:  No focal neurological deficits.  LABORATORY STUDIES:  White cell count is 9.8, hemoglobin 14, hematocrit 41, platelet count is 234.  Peripheral smear shows a normochromic, normocytic population of red blood cells.  There are no nucleated red blood cells.  There are no teardrop cells.  I see no rouleaux formation.  There are no target cells.  I see no spherocytes or schistocytes.  White cells appear normal in morphology and maturation.  There are no immature myeloid cells. There are no blasts.  There are no atypical lymphocytes.  Platelets are adequate in number and size.  IMPRESSION:  Mr. Christoffel is a 78 year old white gentleman who has monoclonal gammopathy of unknown significance.  I am going to try to identify this today.  He also has a deep vein thrombosis of his right leg.  I suppose that both could be related.  He could be hypercoagulable based on the monoclonal gammopathy of unknown significance. However, one would think that he would need a much higher level of protein in order to lead to hypercoagulability.  It is quite unusual for a gentleman his age to have his first deep vein thrombosis.  As such, I do worry about an underlying malignancy.  He does smoke.  He has had asbestos exposure.  He has never had a colonoscopy.  I do think that this warrants a CT scan evaluation.  It is certainly possible that he may need to have a bone  marrow biopsy done.  By his last CT and by his last chest x-ray, there is no mention of any bony lesions that would suggest myeloma.  As such, I do not suspect that he has an underlying myeloma.  He may have an underlying lymphoproliferative process that could lead to hypercoagulability.  This CT scan may show [place] this.  I spent a good hour and 20 minutes or so with Mr. Siefker and his wife. I think this is an interesting situation.  I am not sure exactly what we will find but hopefully we will find something that will "tie everything together."  I need to remember that he is on Xarelto twice a day right now.  He was started on  this 2 weeks ago.  We need to call him to make sure that he gets switched over to the 20 mg a day dose after 3 weeks.  I would probably keep him on the Xarelto for 6 months.  I think this would be a reasonable duration of anticoagulation.  Once the 6 months is complete, then I would get him on aspirin.  It was nice talking to Mr. Lawhorn and his wife.  We had a good fellowship.  It was nice to be able to share our faith.  I just cannot help but think about his current tobacco use and his asbestos exposure that might be related to what is going on with him right now.  I will plan on getting Mr. Homan back probably in another month or so depending on what our lab results show and also what our CT scan shows.    ______________________________ Josph Macho, M.D. PRE/MEDQ  D:  09/14/2011  T:  09/14/2011  Job:  [id number]

## 2011-09-15 ENCOUNTER — Other Ambulatory Visit: Payer: Self-pay | Admitting: *Deleted

## 2011-09-15 ENCOUNTER — Ambulatory Visit: Payer: Medicare Other | Admitting: *Deleted

## 2011-09-15 DIAGNOSIS — I4891 Unspecified atrial fibrillation: Secondary | ICD-10-CM

## 2011-09-15 MED ORDER — RIVAROXABAN 20 MG PO TABS: 20.0000 mg | ORAL_TABLET | Freq: Every day | ORAL | Status: DC

## 2011-09-18 ENCOUNTER — Ambulatory Visit: Payer: Medicare Other | Admitting: *Deleted

## 2011-09-19 ENCOUNTER — Ambulatory Visit: Payer: Medicare Other | Admitting: Internal Medicine

## 2011-09-20 ENCOUNTER — Encounter: Payer: Medicare Other | Admitting: *Deleted

## 2011-09-20 ENCOUNTER — Ambulatory Visit (HOSPITAL_BASED_OUTPATIENT_CLINIC_OR_DEPARTMENT_OTHER)
Admission: RE | Admit: 2011-09-20 | Discharge: 2011-09-20 | Disposition: A | Discharge status: Home / Self Care | Payer: Medicare Other | Source: Ambulatory Visit | Attending: Hematology & Oncology | Admitting: Hematology & Oncology

## 2011-09-20 DIAGNOSIS — M8448XA Pathological fracture, other site, initial encounter for fracture: Secondary | ICD-10-CM | POA: Insufficient documentation

## 2011-09-20 DIAGNOSIS — R05 Cough: Secondary | ICD-10-CM

## 2011-09-20 DIAGNOSIS — R059 Cough, unspecified: Secondary | ICD-10-CM | POA: Insufficient documentation

## 2011-09-20 DIAGNOSIS — J984 Other disorders of lung: Secondary | ICD-10-CM | POA: Insufficient documentation

## 2011-09-20 DIAGNOSIS — J90 Pleural effusion, not elsewhere classified: Secondary | ICD-10-CM | POA: Insufficient documentation

## 2011-09-20 DIAGNOSIS — I82409 Acute embolism and thrombosis of unspecified deep veins of unspecified lower extremity: Secondary | ICD-10-CM

## 2011-09-20 DIAGNOSIS — Z77090 Contact with and (suspected) exposure to asbestos: Secondary | ICD-10-CM

## 2011-09-20 DIAGNOSIS — X58XXXA Exposure to other specified factors, initial encounter: Secondary | ICD-10-CM | POA: Insufficient documentation

## 2011-09-20 DIAGNOSIS — D472 Monoclonal gammopathy: Secondary | ICD-10-CM

## 2011-09-20 DIAGNOSIS — R634 Abnormal weight loss: Secondary | ICD-10-CM | POA: Insufficient documentation

## 2011-09-20 DIAGNOSIS — Z87891 Personal history of nicotine dependence: Secondary | ICD-10-CM | POA: Insufficient documentation

## 2011-09-20 DIAGNOSIS — E278 Other specified disorders of adrenal gland: Secondary | ICD-10-CM | POA: Insufficient documentation

## 2011-09-20 MED ORDER — IOHEXOL 300 MG/ML  SOLN
100.0000 mL | Freq: Once | INTRAMUSCULAR | Status: AC | PRN
  Administered 2011-09-20: 100 mL via INTRAVENOUS

## 2011-09-21 ENCOUNTER — Ambulatory Visit (INDEPENDENT_AMBULATORY_CARE_PROVIDER_SITE_OTHER): Payer: Medicare Other | Admitting: Neurology

## 2011-09-21 ENCOUNTER — Encounter: Payer: Self-pay | Admitting: Neurology

## 2011-09-21 ENCOUNTER — Other Ambulatory Visit: Payer: Self-pay | Admitting: Hematology & Oncology

## 2011-09-21 ENCOUNTER — Ambulatory Visit: Payer: Medicare Other | Admitting: Neurology

## 2011-09-21 ENCOUNTER — Telehealth: Payer: Self-pay | Admitting: Hematology & Oncology

## 2011-09-21 VITALS — BP 110/70 | HR 60 | Wt 188.0 lb

## 2011-09-21 DIAGNOSIS — M5410 Radiculopathy, site unspecified: Secondary | ICD-10-CM

## 2011-09-21 DIAGNOSIS — D472 Monoclonal gammopathy: Secondary | ICD-10-CM

## 2011-09-21 DIAGNOSIS — C9000 Multiple myeloma not having achieved remission: Secondary | ICD-10-CM

## 2011-09-21 DIAGNOSIS — IMO0002 Reserved for concepts with insufficient information to code with codable children: Secondary | ICD-10-CM

## 2011-09-21 LAB — HYPERCOAGULABLE PANEL, COMPREHENSIVE
AntiThromb III Func: 111 % (ref 76–126)
Anticardiolipin IgA: 14 APL U/mL (ref <22)
Anticardiolipin IgM: 6 MPL U/mL (ref <11)
Beta-2 Glyco I IgG: 2 G Units (ref <20)
Beta-2-Glycoprotein I IgA: 7 A Units (ref <20)
Beta-2-Glycoprotein I IgM: 5 M Units (ref <20)
PTT Lupus Anticoagulant: 51 secs — ABNORMAL HIGH (ref 28.0–43.0)
PTTLA 4:1 Mix: 45.1 secs — ABNORMAL HIGH (ref 28.0–43.0)
Protein C, Total: 102 % (ref 72–160)
Protein S Total: 95 % (ref 60–150)

## 2011-09-21 LAB — PROTEIN ELECTROPHORESIS, SERUM, WITH REFLEX
Albumin ELP: 50.9 % — ABNORMAL LOW (ref 55.8–66.1)
Beta 2: 3.5 % (ref 3.2–6.5)
Gamma Globulin: 9.2 % — ABNORMAL LOW (ref 11.1–18.8)
M-Spike, %: 0.85 g/dL

## 2011-09-21 NOTE — Telephone Encounter (Signed)
Pt aware of 9-16 thoracentesis and 10-02-12 BMBX and to be NPO after midnight

## 2011-09-21 NOTE — Progress Notes (Signed)
Dear Dr. Rodena Medin,  I saw  Steve [name] back in [facility] for his problem with difficulty with gait as well as spells of possible presyncope.  As you may recall, he is a 78 y.o. year old male with a history of COPD and AAA repair who has had progressive difficulty walking over the last tow years.  He has had several falls, including one resulting in an L1 compression fracture about 1 month ago.He says walking is worse at night, he is more unstable in the shower and on uneven ground. Denies numbness or tingling in his legs. Denies change in bladder habits.   He also has had one recent spell of what sounds like presyncope with the sudden onset of tunnel vision that lasted about 3 minutes. It was followed by some lightheadedness when he stood up. He denies lightheadedness at other times, although has had a spell of presumed syncope after his hernia surgery. He was seen by EMS, who did an EKG which revealed a sinus rhythm with a possible LAFB. BP was 170/90. Interestingly he noted that he has had orthostatic bp drops in the past. He has not had any changes to his blood pressure medications. He did not have chest pain or palpitations with this spell.   ----------------------------  At his first visi t as described a I ordered an EMG/NCS as well as PN labs.  PN labs revealed an IgA MGUS, but NCS did not reveal a peripheral neuropathy.  His right sided EMG did reveal a chronic right L4-L5 and  L5-S1 radiculopathy.  Since the EMG he has had some problems with increasing foot numbness on the right as well as leg pain.  He has had two other falls.  His falls seem to involve him falling to the right.  I see that he has currently has a Holter monitor for his presyncope spell as well as you have set him up to see Dr. Myna Hidalgo for his MGUS.  -----------------------------------------  At his second visit as outlined above I order a U/S for his right leg for swelling.  It revealed a DVT and he has  been started on treatment for it.  He is also being closely followed by [facility] for his MGUS and they are planning a bone marrow as they are worried about a malignancy.  His walking is about the same.  He continues to complain of burning pain into his foot on the right.  Notably an old MRI of his L-spine in 08/2010 old revealed mild spondylosis and no significant stenosis or foraminal disease at the time.  For some reason he did not show up for his MRI of his L-spine scheduled for this August.  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    Current Outpatient Prescriptions on File Prior to Visit  Medication Sig Dispense Refill  . albuterol (PROVENTIL HFA;VENTOLIN HFA) 108 (90 BASE) MCG/ACT inhaler Inhale 2 puffs into the lungs every 6 (six) hours as needed for wheezing.  1 Inhaler  2  . metoprolol tartrate (LOPRESSOR) 12.5 mg TABS Take 0.5 tablets (12.5 mg total) by mouth 2 (two) times daily.  30 tablet  0  . Multiple Vitamin (MULTIVITAMIN WITH MINERALS) TABS Take 1 tablet by mouth daily.  30 tablet  0  . Rivaroxaban (XARELTO) 20 MG TABS Take 1 tablet (20 mg total) by mouth daily.  30 tablet  0  . senna (SENOKOT) 8.6 MG TABS  Take 1 tablet (8.6 mg total) by mouth 2 (two) times daily.  10 tablet  0  . sertraline (ZOLOFT) 50 MG tablet Take 1 tablet (50 mg total) by mouth daily.  30 tablet  11  . tiotropium (SPIRIVA) 18 MCG inhalation capsule Place 18 mcg into inhaler and inhale daily.      Marland Kitchen ALPRAZolam (XANAX) 0.25 MG tablet Take 0.25 mg by mouth at bedtime as needed.        Allergies  Allergen Reactions  . Lyrica (Pregabalin) Hives    ROS:  13 systems were reviewed and  are unremarkable.  Exam: . Filed Vitals:   09/21/11 1323  BP: 110/70  Pulse: 60  Weight: 188 lb (85.276 kg)    In general, weathered appearing gentleman.    Motor:  Normal bulk and tone, no drift and 5/5 muscle strength bilaterally.  Reflexes:  1+ thoughout UE,  absent lowers, toes down.  Sensation:  absent in right foot to vibration, decreased in left foot.  position normal left, decreased right.  pp ?L5 on right.  Also length dep temperature loss in UE and LE.  Coordination:  Normal finger to nose  Gait:  Mildly wide based, some antalgia when putting weight on right leg.  Seems to weave.  Impression/Recommendations:  1.  Gait instability - His gait abnormality is likely multifactorial.  He has a small lacunar infarct in his cerebellum, he has a mild radiculopathy by EMG of his right leg, with decreased sensation in that leg versus his left leg.  He also takes Xanax small dose that I guess conceivably could be making him more unsteady -- although he has tried to come off it and it has not helped.  I am going to get an MRI L-spine with and without contrast to see if it has changed significantly.  He will continue with therapy.  However, I think his big issue right now is his possible malignancy.   He will see Dr. Arbutus Leas in about six weeks.  Lupita Raider Modesto Charon, MD [facility], [place]

## 2011-09-21 NOTE — Patient Instructions (Addendum)
Your MRI is scheduled for Tuesday, Sept. 19 at 2:00pm.  Please arrive to [facility], first floor admitting by 1:45pm.  [phone].

## 2011-09-24 LAB — HEXAGONAL PHOSPHOLIPID NEUTRALIZATION: Hex Phosph Neut Test: POSITIVE — AB

## 2011-09-25 ENCOUNTER — Ambulatory Visit (HOSPITAL_COMMUNITY)
Admission: RE | Admit: 2011-09-25 | Discharge: 2011-09-25 | Disposition: A | Discharge status: Home / Self Care | Payer: Medicare Other | Source: Ambulatory Visit | Attending: Hematology & Oncology | Admitting: Hematology & Oncology

## 2011-09-25 ENCOUNTER — Ambulatory Visit (HOSPITAL_COMMUNITY)
Admission: RE | Admit: 2011-09-25 | Discharge: 2011-09-25 | Disposition: A | Discharge status: Home / Self Care | Payer: Medicare Other | Source: Ambulatory Visit | Attending: Physician Assistant | Admitting: Physician Assistant

## 2011-09-25 ENCOUNTER — Encounter: Payer: Medicare Other | Admitting: *Deleted

## 2011-09-25 DIAGNOSIS — J90 Pleural effusion, not elsewhere classified: Secondary | ICD-10-CM | POA: Insufficient documentation

## 2011-09-25 DIAGNOSIS — D472 Monoclonal gammopathy: Secondary | ICD-10-CM

## 2011-09-25 NOTE — Procedures (Signed)
Procedure : right thoracentesis Specimen : 800 ml amber colored fluid complications : none immediate  Fluid sent to lab as requested. Post CXR pending.

## 2011-09-26 ENCOUNTER — Other Ambulatory Visit: Payer: Self-pay | Admitting: *Deleted

## 2011-09-26 ENCOUNTER — Inpatient Hospital Stay (HOSPITAL_COMMUNITY): Admission: RE | Admit: 2011-09-26 | Payer: Medicare Other | Source: Ambulatory Visit

## 2011-09-27 ENCOUNTER — Encounter (HOSPITAL_COMMUNITY): Payer: Self-pay | Admitting: Pharmacy Technician

## 2011-09-27 ENCOUNTER — Ambulatory Visit: Payer: Medicare Other | Admitting: *Deleted

## 2011-10-02 ENCOUNTER — Ambulatory Visit: Payer: Medicare Other | Admitting: *Deleted

## 2011-10-02 ENCOUNTER — Other Ambulatory Visit: Payer: Self-pay | Admitting: *Deleted

## 2011-10-02 DIAGNOSIS — D472 Monoclonal gammopathy: Secondary | ICD-10-CM

## 2011-10-03 ENCOUNTER — Encounter (HOSPITAL_COMMUNITY): Payer: Self-pay

## 2011-10-03 ENCOUNTER — Ambulatory Visit (HOSPITAL_COMMUNITY)
Admission: RE | Admit: 2011-10-03 | Discharge: 2011-10-03 | Disposition: A | Discharge status: Home / Self Care | Payer: Medicare Other | Source: Ambulatory Visit | Attending: Hematology & Oncology | Admitting: Hematology & Oncology

## 2011-10-03 ENCOUNTER — Ambulatory Visit (HOSPITAL_BASED_OUTPATIENT_CLINIC_OR_DEPARTMENT_OTHER): Payer: Medicare Other | Admitting: Hematology & Oncology

## 2011-10-03 VITALS — BP 164/78 | HR 58 | Temp 97.7°F | Resp 15 | Ht 68.5 in | Wt 182.0 lb

## 2011-10-03 DIAGNOSIS — D472 Monoclonal gammopathy: Secondary | ICD-10-CM

## 2011-10-03 DIAGNOSIS — D72829 Elevated white blood cell count, unspecified: Secondary | ICD-10-CM | POA: Insufficient documentation

## 2011-10-03 DIAGNOSIS — E8809 Other disorders of plasma-protein metabolism, not elsewhere classified: Secondary | ICD-10-CM | POA: Insufficient documentation

## 2011-10-03 HISTORY — DX: Acute embolism and thrombosis of unspecified deep veins of unspecified lower extremity: I82.409

## 2011-10-03 LAB — CBC WITH DIFFERENTIAL/PLATELET
Basophils Absolute: 0 10*3/uL (ref 0.0–0.1)
Eosinophils Relative: 3 % (ref 0–5)
HCT: 40.5 % (ref 39.0–52.0)
Hemoglobin: 13.8 g/dL (ref 13.0–17.0)
Lymphocytes Relative: 22 % (ref 12–46)
MCHC: 34.1 g/dL (ref 30.0–36.0)
MCV: 94.2 fL (ref 78.0–100.0)
Monocytes Absolute: 0.7 10*3/uL (ref 0.1–1.0)
Monocytes Relative: 7 % (ref 3–12)
Neutro Abs: 7.3 10*3/uL (ref 1.7–7.7)
RDW: 15.6 % — ABNORMAL HIGH (ref 11.5–15.5)

## 2011-10-03 MED ORDER — SODIUM CHLORIDE 0.9 % IV SOLN
Freq: Once | INTRAVENOUS | Status: AC
  Administered 2011-10-03: 07:00:00 via INTRAVENOUS

## 2011-10-03 MED ORDER — MEPERIDINE HCL 25 MG/ML IJ SOLN
INTRAMUSCULAR | Status: AC | PRN
  Administered 2011-10-03 (×2): 12.5 mg via INTRAVENOUS

## 2011-10-03 MED ORDER — MIDAZOLAM HCL 10 MG/2ML IJ SOLN
INTRAMUSCULAR | Status: AC
  Filled 2011-10-03: qty 2

## 2011-10-03 MED ORDER — MIDAZOLAM HCL 5 MG/5ML IJ SOLN
INTRAMUSCULAR | Status: AC | PRN
  Administered 2011-10-03: .5 mg via INTRAVENOUS
  Administered 2011-10-03 (×2): 1 mg via INTRAVENOUS

## 2011-10-03 MED ORDER — MEPERIDINE HCL 50 MG/ML IJ SOLN
INTRAMUSCULAR | Status: AC
  Filled 2011-10-03: qty 1

## 2011-10-03 NOTE — Sedation Documentation (Signed)
Medication dose calculated and verified OZH:YQMVHQI 25 mg; Versed 2.5 mg

## 2011-10-03 NOTE — Progress Notes (Signed)
Ambulated to BR with assistance and tolerated this well dressing remains CDI

## 2011-10-03 NOTE — ED Notes (Signed)
Family updated as to patient's status.

## 2011-10-03 NOTE — ED Notes (Signed)
Patient denies pain and is resting comfortably.  

## 2011-10-03 NOTE — ED Notes (Signed)
Pt placed on right side procedure begins 

## 2011-10-03 NOTE — ED Notes (Signed)
Procedure ends. Dressing to post iliac area with hypafix and gauze. Pt placed supine

## 2011-10-03 NOTE — ED Notes (Signed)
Post iliac dressing CDI 

## 2011-10-03 NOTE — ED Notes (Signed)
Patient is resting comfortably. 

## 2011-10-04 ENCOUNTER — Encounter: Payer: Medicare Other | Admitting: *Deleted

## 2011-10-04 NOTE — Progress Notes (Signed)
This office note has been dictated.

## 2011-10-05 NOTE — Procedures (Signed)
PROCEDURE:  Bone marrow biopsy and aspiration.  Steve [name] was taken to the treatment room at the [facility].  We did his time-out appropriately.  We then did his Mallampati score, which was 1.  His ASA class was II.  He had an IV placed without difficulty.  He was then placed onto his right side.  He received a total of 2.5 mg of Versed and 25 mg of Demerol for IV sedation.  The left posterior iliac crest region was prepped and draped in a sterile fashion.  Eight milliliters of 2% lidocaine was infiltrated under the skin down to the periosteum.  A #11 scalpel was used to make an incision into the skin.  We obtained a good bone marrow aspirate without difficulty.  We then obtained a 2nd aspirate to be sent off for cytogenetics and flow cytometry.  We then used a Jamshidi biopsy needle.  We obtained an excellent bone marrow biopsy core.  The bone marrow biopsy and aspirate were down because he has an MGUS that we want to make sure that he rules out for myeloma.  He tolerated the procedure well.  There were no complications.  The procedure site was dressed appropriately.  I talked to his wife afterwards.  I will try to give him a call in 3-4 days, depending on when our results are back.    ______________________________ Josph Macho, M.D. PRE/MEDQ  D:  10/04/2011  T:  10/05/2011  Job:  [id number]

## 2011-10-06 ENCOUNTER — Ambulatory Visit (HOSPITAL_COMMUNITY)
Admission: RE | Admit: 2011-10-06 | Discharge: 2011-10-06 | Disposition: A | Discharge status: Home / Self Care | Payer: Medicare Other | Source: Ambulatory Visit | Attending: Neurology | Admitting: Neurology

## 2011-10-06 DIAGNOSIS — X58XXXA Exposure to other specified factors, initial encounter: Secondary | ICD-10-CM | POA: Insufficient documentation

## 2011-10-06 DIAGNOSIS — M8448XA Pathological fracture, other site, initial encounter for fracture: Secondary | ICD-10-CM | POA: Insufficient documentation

## 2011-10-06 DIAGNOSIS — M5137 Other intervertebral disc degeneration, lumbosacral region: Secondary | ICD-10-CM | POA: Insufficient documentation

## 2011-10-06 DIAGNOSIS — M5410 Radiculopathy, site unspecified: Secondary | ICD-10-CM

## 2011-10-06 DIAGNOSIS — IMO0002 Reserved for concepts with insufficient information to code with codable children: Secondary | ICD-10-CM | POA: Insufficient documentation

## 2011-10-06 DIAGNOSIS — M51379 Other intervertebral disc degeneration, lumbosacral region without mention of lumbar back pain or lower extremity pain: Secondary | ICD-10-CM | POA: Insufficient documentation

## 2011-10-06 DIAGNOSIS — C9000 Multiple myeloma not having achieved remission: Secondary | ICD-10-CM | POA: Insufficient documentation

## 2011-10-06 DIAGNOSIS — M519 Unspecified thoracic, thoracolumbar and lumbosacral intervertebral disc disorder: Secondary | ICD-10-CM | POA: Insufficient documentation

## 2011-10-06 MED ORDER — GADOBENATE DIMEGLUMINE 529 MG/ML IV SOLN
18.0000 mL | Freq: Once | INTRAVENOUS | Status: AC
  Administered 2011-10-06: 18 mL via INTRAVENOUS

## 2011-10-10 ENCOUNTER — Ambulatory Visit: Payer: Medicare Other | Attending: Internal Medicine | Admitting: *Deleted

## 2011-10-10 DIAGNOSIS — R269 Unspecified abnormalities of gait and mobility: Secondary | ICD-10-CM | POA: Insufficient documentation

## 2011-10-10 DIAGNOSIS — M6281 Muscle weakness (generalized): Secondary | ICD-10-CM | POA: Insufficient documentation

## 2011-10-10 DIAGNOSIS — IMO0001 Reserved for inherently not codable concepts without codable children: Secondary | ICD-10-CM | POA: Insufficient documentation

## 2011-10-12 ENCOUNTER — Encounter: Payer: Medicare Other | Admitting: *Deleted

## 2011-10-16 ENCOUNTER — Ambulatory Visit: Payer: Medicare Other | Admitting: *Deleted

## 2011-10-17 ENCOUNTER — Encounter: Payer: Medicare Other | Admitting: Thoracic Surgery (Cardiothoracic Vascular Surgery)

## 2011-10-18 ENCOUNTER — Encounter: Payer: Medicare Other | Admitting: *Deleted

## 2011-10-19 ENCOUNTER — Ambulatory Visit: Payer: Medicare Other | Admitting: *Deleted

## 2011-10-20 ENCOUNTER — Ambulatory Visit (HOSPITAL_BASED_OUTPATIENT_CLINIC_OR_DEPARTMENT_OTHER): Payer: Medicare Other | Admitting: Hematology & Oncology

## 2011-10-20 ENCOUNTER — Other Ambulatory Visit (HOSPITAL_BASED_OUTPATIENT_CLINIC_OR_DEPARTMENT_OTHER): Payer: Medicare Other | Admitting: Lab

## 2011-10-20 VITALS — BP 163/61 | HR 53 | Temp 97.8°F | Resp 18 | Ht 68.0 in | Wt 191.0 lb

## 2011-10-20 DIAGNOSIS — D472 Monoclonal gammopathy: Secondary | ICD-10-CM

## 2011-10-20 DIAGNOSIS — D6859 Other primary thrombophilia: Secondary | ICD-10-CM

## 2011-10-20 DIAGNOSIS — I82409 Acute embolism and thrombosis of unspecified deep veins of unspecified lower extremity: Secondary | ICD-10-CM

## 2011-10-20 DIAGNOSIS — J90 Pleural effusion, not elsewhere classified: Secondary | ICD-10-CM

## 2011-10-20 LAB — CBC WITH DIFFERENTIAL (CANCER CENTER ONLY)
BASO#: 0 10*3/uL (ref 0.0–0.2)
BASO%: 0.3 % (ref 0.0–2.0)
EOS%: 2.4 % (ref 0.0–7.0)
HCT: 41.6 % (ref 38.7–49.9)
HGB: 14.1 g/dL (ref 13.0–17.1)
LYMPH#: 1.8 10*3/uL (ref 0.9–3.3)
LYMPH%: 23.6 % (ref 14.0–48.0)
MCH: 32.5 pg (ref 28.0–33.4)
MCHC: 33.9 g/dL (ref 32.0–35.9)
MONO%: 6.6 % (ref 0.0–13.0)
NEUT%: 67.1 % (ref 40.0–80.0)
RDW: 14.6 % (ref 11.1–15.7)

## 2011-10-20 NOTE — Progress Notes (Signed)
This office note has been dictated.

## 2011-10-20 NOTE — Progress Notes (Signed)
CC:   Salvatore Decent. Dorris Fetch, M.D. Marguarite Arbour, MD  DIAGNOSES: 1. Deep venous thrombosis of the right leg, factor V Leiden     mutation/lupus anticoagulant. 2. 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 3. Possible malignant right effusion.  CURRENT THERAPY:  Xarelto 20 mg p.o. daily.  INTERIM HISTORY:  [name] comes in for followup.  Since I initially saw him, we have kept him quite busy.  He has a lot going on.  As far as his DVT goes, we found that he does have a factor V Leiden abnormality. He is heterozygous for this.  He also has a lupus anticoagulant that was confirmed.  He is on Xarelto.  He says his right leg is feeling better.  We did do a bone marrow biopsy on him.  This was done on 09/24.  The bone marrow report ([id number]) showed a hypercellular marrow with 5% plasma cells.  This probably would classify him as MGUS.  He has an IgA lambda spike.  We did do a CT scan.  He had a right pleural effusion.  This was removed.  The pathology report on the pleural effusion ([id number]) showed highly atypical cells.  The pathologist was very concerned about the cells being malignant.  Mr. Bruntz sees Dr. Dorris Fetch of thoracic surgery next week for consideration of a VATS procedure for biopsy.  He has heavy occupational exposures to asbestos.  He also smokes quite a bit.  He is at risk for malignancy.  Malignancy could certainly explain the effusion and an MGUS and the blood clot.  He comes in with his 2 kids.  His wife is in the hospital with pneumonia.  We do have an MRI of his back.  This was on 09/27.  There were no bone lesions noted.  He had a chronic compression fracture of L1.  He had some disk degeneration.  A lot is going on with Mr. Lancour.  He has gained some weight.  His appetite is a little bit better.  I am not sure why his appetite is better.  He has had no bleeding.  He has had no change in bowel or bladder habits.   He has had no cough.  There has been no leg swelling.  PHYSICAL EXAMINATION:  General:  This is a somewhat elderly appearing white gentleman in no obvious distress.  Vital signs:  97.8, pulse 53, respiratory rate 18, blood pressure 163/61.  Weight is 191.  Head and neck:  Showed a normocephalic, atraumatic skull.  There are no ocular or oral lesions.  There are no palpable cervical or supraclavicular lymph nodes.  Lungs:  Clear bilaterally.  Cardiac:  Regular rate and rhythm with a normal S1 and S2.  There are no murmurs, rubs or bruits. Abdomen:  Soft with good bowel sounds.  There is no palpable abdominal mass.  There is no fluid wave.  There is no palpable hepatosplenomegaly. Extremities:  Shows mild nonpitting edema of the right leg.  No venous cord is noted in the right leg.  He has good pulses in his distal extremities.  Neurological:  Shows no focal neurological deficits.  LABORATORY STUDIES:  White cell count 7.5, hemoglobin 14.1, hematocrit 41.6, platelet count 170.  IMPRESSION:  Mr. Meditz is a 78 year old gentleman with multiple problems.  He does have a hypercoagulable issue.  He does have the factor V Leiden and also is lupus anticoagulant positive.  He is on Xarelto.  I will keep  him on Xarelto for a year, if not longer.  My main concern, however, is this pleural effusion.  Again, I am really worried that he may have a malignancy.  We will have to wait for Dr. Dorris Fetch to do the procedure and await the results of the pathology.  I will follow up with Mr. Dockendorf once I know the results of his surgery.  Again, this is a complicated situation.  Hopefully, we will not find any malignancy but I do worry about this.    ______________________________ Josph Macho, M.D. PRE/MEDQ  D:  10/20/2011  T:  10/20/2011  Job:  [id number]

## 2011-10-24 ENCOUNTER — Other Ambulatory Visit: Payer: Self-pay | Admitting: *Deleted

## 2011-10-24 ENCOUNTER — Institutional Professional Consult (permissible substitution) (INDEPENDENT_AMBULATORY_CARE_PROVIDER_SITE_OTHER): Payer: Medicare Other | Admitting: Thoracic Surgery (Cardiothoracic Vascular Surgery)

## 2011-10-24 ENCOUNTER — Encounter: Payer: Self-pay | Admitting: Thoracic Surgery (Cardiothoracic Vascular Surgery)

## 2011-10-24 VITALS — BP 142/70 | HR 52 | Resp 16 | Ht 68.0 in | Wt 182.0 lb

## 2011-10-24 DIAGNOSIS — J90 Pleural effusion, not elsewhere classified: Secondary | ICD-10-CM

## 2011-10-24 MED ORDER — TALC 5 G PL SUSR: 3.0000 g | Freq: Once | INTRAVENOUS | Status: DC

## 2011-10-24 NOTE — Patient Instructions (Signed)
Do not take Xarelto the day prior to surgery or the day of surgery

## 2011-10-24 NOTE — Progress Notes (Signed)
PCP is Hodgin Jr, Thomas Whitson, MD Referring Provider is Ennever, Peter R, MD  Chief Complaint  Patient presents with  . Pleural Effusion    eval for VATS BX    HPI: [name] is a 78-year-old gentleman presents with chief complaint of shortness of breath  [name] is a 78-year-old gentleman with multiple medical problems and long-term history of tobacco abuse. He also has a remote history of exposure to asbestos. He recently has been having problems with frequent falls. During his workup at that he was found to have a DVT for which he is being treated with Xarelto. He also has been found to have a right pleural effusion. He had a thoracentesis for this on 09/25/11. Cytology showed highly atypical cells, suspicious for malignancy. Cells were positive for diabetes-1, calretinin and cytokeratin 5/6. They were negative for Napsin A, TTF-1 and MOC-31. He also recently has had a bone marrow biopsy which showed a slightly hypercellular bone marrow for age with plasma cell dyscrasia. Dr. Ennever has referred him for pleural biopsy.   Past Medical History  Diagnosis Date  . Prostate enlargement   . History of chicken pox     childhood  . GERD (gastroesophageal reflux disease)   . Arthritis     knee and back  . AAA (abdominal aortic aneurysm)     stress test 09/14/10 EPIC  . Anxiety   . Dysrhythmia     hx of atrial fib post op x 2 days   . COPD (chronic obstructive pulmonary disease)   . Shortness of breath     with exertion   . Recurrent upper respiratory infection (URI)     productive cough- white phlegm- no fever  . Sleep apnea      12/12 sleep study,SEVERE per study  Dr Byrum- states doesnt wear machine on regular basis- setting BiPAP 9/9  . Hypertension     chest x ray 12/12 EPIC- repeated 06/06/11, EKG 11/12 EPIC  . DVT (deep venous thrombosis) 08/2011    Past Surgical History  Procedure Date  . Knee surgery 1957    left knee arthotomy  . Abdominal aortic aneurysm repair  11/14/2010    AAA Repair    Dr Early  . Transurethral resection of prostate 01/25/2011    TURP  . Incisional hernia repair 06/12/2011    Procedure: HERNIA REPAIR INCISIONAL;  Surgeon: Todd M Gerkin, MD;  Location: [facility];  Service: General;  Laterality: N/A;  Open Repair Ventral Incisional Hernia with Mesh    Family History  Problem Relation Age of Onset  . Arthritis Mother   . Heart failure Mother   . Hypertension Mother   . Diabetes Mother   . Heart failure Daughter     deceased age 50  . Other Daughter     deceased age 22 MVA    Social History History  Substance Use Topics  . Smoking status: Current Every Day Smoker -- 1.0 packs/day    Types: Cigarettes  . Smokeless tobacco: Never Used   Comment: started smoking at age 21.  Currently smoking 1 ppd.  . Alcohol Use: No    Current Outpatient Prescriptions  Medication Sig Dispense Refill  . albuterol (PROVENTIL HFA;VENTOLIN HFA) 108 (90 BASE) MCG/ACT inhaler Inhale 2 puffs into the lungs every 6 (six) hours as needed for wheezing.  1 Inhaler  2  . ALPRAZolam (XANAX) 0.25 MG tablet Take 0.25 mg by mouth at bedtime as needed.      .   metoprolol tartrate (LOPRESSOR) 12.5 mg TABS Take 0.5 tablets (12.5 mg total) by mouth 2 (two) times daily.  30 tablet  0  . Multiple Vitamin (MULTIVITAMIN WITH MINERALS) TABS Take 1 tablet by mouth daily.      . Rivaroxaban (XARELTO) 20 MG TABS Take 20 mg by mouth every morning.      . senna (SENOKOT) 8.6 MG tablet Take 2 tablets by mouth 2 (two) times daily.       . sertraline (ZOLOFT) 50 MG tablet Take 50 mg by mouth every morning.      . tiotropium (SPIRIVA) 18 MCG inhalation capsule Place 18 mcg into inhaler and inhale daily.        Allergies  Allergen Reactions  . Lyrica (Pregabalin) Hives    Review of Systems  Respiratory: Positive for apnea (uses 02 at night, no CPAP), chest tightness (pleuritic right sided CP with cough x 1 year) and shortness of breath.   Cardiovascular:       Recent  dvt, atrial fibrillation  Neurological: Negative for dizziness and syncope.       Frequent falls  Psychiatric/Behavioral:       Anxiety  All other systems reviewed and are negative.    BP 142/70  Pulse 52  Resp 16  Ht 5' 8" (1.727 m)  Wt 182 lb (82.555 kg)  BMI 27.67 kg/m2  SpO2 96% Physical Exam  Vitals reviewed. Constitutional: He is oriented to person, place, and time. He appears well-developed and well-nourished.  HENT:  Head: Normocephalic and atraumatic.  Eyes: EOM are normal. Pupils are equal, round, and reactive to light.  Neck: Neck supple. No thyromegaly present.  Cardiovascular:  No murmur heard.      Irregularly irregular  Pulmonary/Chest:       Diminished BS right base  Abdominal: Soft. There is no tenderness.  Musculoskeletal: He exhibits no edema.  Lymphadenopathy:    He has no cervical adenopathy.  Neurological: He is alert and oriented to person, place, and time. No cranial nerve deficit.  Skin: Skin is warm and dry.     Diagnostic Tests: CT chest 09/20/11  *RADIOLOGY REPORT*  Clinical Data: Cough, asbestos exposure, concern for lung cancer.  Smoking history.  CT CHEST, ABDOMEN AND PELVIS WITH CONTRAST  Technique: Multidetector CT imaging of the chest, abdomen and  pelvis was performed following the standard protocol during bolus  administration of intravenous contrast.  Contrast: 100mL OMNIPAQUE IOHEXOL 300 MG/ML SOLN  Comparison: CT thorax 06/17/2011  CT CHEST  Findings: No axillary or supraclavicular lymphadenopathy. No  mediastinal or hilar lymphadenopathy. Coronary calcifications are  present. Esophagus is normal.  There is an increasing right pleural effusion compared to CT of  06/17/2011. There is central lobular emphysema in the right upper  lobe. Within the in the superior segment of the left lower lobe,  11 mm nodule (image 30) is not changed from 11 mm on prior. More  remote CT lesion also measures 9 mm (07/21/10). Lesion has low    attenuation suggesting a benign cystic lesion or hamartoma.  IMPRESSION:  1. No evidence of thoracic malignancy.  2. Increasing right pleural effusion.  3. Stable low density nodule in the left lower lobe likely  represents a hamartoma or bronchogenic cyst.  CT ABDOMEN AND PELVIS  Findings: No focal hepatic lesion. The gallbladder, pancreas,  spleen, and right adrenal gland are normal. There is a tiny nodule  in the left adrenal gland measuring 6 mm (image 56) which is the    too small to characterize but is unchanged from comparison CT.  The stomach, small bowel, and colon are normal.  Abdominal aorta normal caliber. No retroperitoneal or periportal  lymphadenopathy.  No peritoneal disease. Superficial to the abdominal rectus muscles  there is a flat fluid collection with simple attenuation and likely  represents a postsurgical seroma.  No free fluid the pelvis. The bladder prostate gland are normal.  No pelvic lymphadenopathy. Review of bone windows demonstrates no  aggressive osseous lesions. Chronic compression fracture at L1 with  approximate 50% loss vertebral body height. No retropulsion.  IMPRESSION:  1. No evidence of malignancy in the abdomen or pelvis.  2. Benign-appearing left adrenal cyst in the right renal cyst.  3. Fluid collection in the ventral subcutaneous tissue likely  relates to prior surgery.  Impression: 78-year-old gentleman with multiple medical problems with highly atypical cells on cytology of a right pleural effusion. There is concern that he may have an underlying malignancy. He does have a history of smoking as well as exposure to asbestos. Complicating matters is a recent deep venous thrombosis which is being treated with Xarelto.  I discussed with Mr. and Mrs. Steve [name] the indications, risks, benefits, and alternatives to right VATS and pleural biopsy. They understand that this is a diagnostic procedure. They understand that it is a major operation  requiring general anesthesia and has potential risk for major morbidity or mortality. They do understand the general nature of the procedure including the need for general anesthesia, incisions to be used, general conduct of the procedure, intraoperative decision making, and expected hospital stay and overall recovery. They understand the risk include but are not limited to death, stroke, MI, DVT, PE, bleeding, possible need for transfusion, infection, recurrent pleural effusion, as well as the potential for unforeseeable complications.  We did discuss the plan would be right video-assisted thoracoscopy for pleural biopsies. We may choose to place a Pleurx catheter and/or attempt talc pleurodesis depending on the intraoperative findings. He wishes to proceed.  He will need to hold his Xarelto the day before surgery as well as the day of surgery.  Plan: Right VATS, pleural biopsy, possible pleura the cyst, possible Pleurx catheter placement on Thursday, October 24. He will hold Xarelto on the 23rd and 24th. 

## 2011-10-25 ENCOUNTER — Telehealth: Payer: Self-pay | Admitting: Internal Medicine

## 2011-10-25 LAB — PROTEIN ELECTROPHORESIS, SERUM, WITH REFLEX
Albumin ELP: 50.9 % — ABNORMAL LOW (ref 55.8–66.1)
Alpha-1-Globulin: 3.7 % (ref 2.9–4.9)
Alpha-2-Globulin: 13.4 % — ABNORMAL HIGH (ref 7.1–11.8)
Beta Globulin: 18.1 % — ABNORMAL HIGH (ref 4.7–7.2)
M-Spike, %: 0.97 g/dL
Total Protein, Serum Electrophoresis: 6.9 g/dL (ref 6.0–8.3)

## 2011-10-25 LAB — IFE INTERPRETATION

## 2011-10-25 LAB — KAPPA/LAMBDA LIGHT CHAINS: Kappa:Lambda Ratio: 0.07 — ABNORMAL LOW (ref 0.26–1.65)

## 2011-10-25 NOTE — Telephone Encounter (Signed)
Refill-alprazolam 0.25mg  tab. Take one tablet by mouth three times daily as needed for anxiety. Qty 90 last fill 8.25.13

## 2011-10-26 ENCOUNTER — Other Ambulatory Visit: Payer: Self-pay | Admitting: Hematology & Oncology

## 2011-10-26 ENCOUNTER — Encounter (HOSPITAL_COMMUNITY): Payer: Self-pay | Admitting: Pharmacy Technician

## 2011-10-26 MED ORDER — ALPRAZOLAM 0.25 MG PO TABS: 0.2500 mg | ORAL_TABLET | Freq: Three times a day (TID) | ORAL | Status: DC | PRN

## 2011-10-26 NOTE — Telephone Encounter (Signed)
Refill left on pharmacy voicemail #[phone] x no refills. 

## 2011-10-27 NOTE — Telephone Encounter (Signed)
Refill updated with Marcelino Duster at [place].

## 2011-10-27 NOTE — Telephone Encounter (Signed)
#  [phone] [id number] 

## 2011-10-30 NOTE — Pre-Procedure Instructions (Signed)
20 TAESEAN RETH  10/30/2011   Your procedure is scheduled on:  Thursday November 02, 2011.  Report to [facility] at 0830 AM.  Call this number if you have problems the morning of surgery: [phone]   Remember:   Do not eat food or drink:After Midnight.    Take these medicines the morning of surgery with A SIP OF WATER: Albuterol inhaler if needed for wheezing, Alprazolam (Xanax) if needed for anxiety, Metoprolol (Lopressor), Sertraline (Zoloft), and Spiriva inhaler.    Do not wear jewelry  Do not wear lotions or colognes.   Men may shave face and neck.  Do not bring valuables to the hospital.  Contacts, dentures or bridgework may not be worn into surgery.  Leave suitcase in the car. After surgery it may be brought to your room.  For patients admitted to the hospital, checkout time is 11:00 AM the day of discharge.   Patients discharged the day of surgery will not be allowed to drive home.  Name and phone number of your driver:   Special Instructions: Shower using CHG 2 nights before surgery and the night before surgery.  If you shower the day of surgery use CHG.  Use special wash - you have one bottle of CHG for all showers.  You should use approximately 1/3 of the bottle for each shower.   Please read over the following fact sheets that you were given: Pain Booklet, Coughing and Deep Breathing, Blood Transfusion Information, MRSA Information and Surgical Site Infection Prevention

## 2011-10-31 ENCOUNTER — Encounter (HOSPITAL_COMMUNITY)
Admission: RE | Admit: 2011-10-31 | Discharge: 2011-10-31 | Disposition: A | Discharge status: Home / Self Care | Payer: Medicare Other | Source: Ambulatory Visit | Attending: Thoracic Surgery (Cardiothoracic Vascular Surgery) | Admitting: Thoracic Surgery (Cardiothoracic Vascular Surgery)

## 2011-10-31 ENCOUNTER — Encounter (HOSPITAL_COMMUNITY): Payer: Self-pay

## 2011-10-31 VITALS — BP 148/74 | HR 52 | Temp 97.7°F | Resp 20 | Ht 68.0 in | Wt 194.2 lb

## 2011-10-31 DIAGNOSIS — J90 Pleural effusion, not elsewhere classified: Secondary | ICD-10-CM

## 2011-10-31 HISTORY — DX: Other skin changes: R23.8

## 2011-10-31 HISTORY — DX: Pneumonia, unspecified organism: J18.9

## 2011-10-31 HISTORY — DX: Spontaneous ecchymoses: R23.3

## 2011-10-31 LAB — TYPE AND SCREEN
ABO/RH(D): A POS
Antibody Screen: NEGATIVE

## 2011-10-31 LAB — URINALYSIS, ROUTINE W REFLEX MICROSCOPIC
Protein, ur: NEGATIVE mg/dL
Urobilinogen, UA: 0.2 mg/dL (ref 0.0–1.0)

## 2011-10-31 LAB — BLOOD GAS, ARTERIAL
Acid-Base Excess: 0 mmol/L (ref 0.0–2.0)
Drawn by: 181601
FIO2: 0.21 %
O2 Saturation: 95.4 %
pCO2 arterial: 37.9 mmHg (ref 35.0–45.0)

## 2011-10-31 LAB — COMPREHENSIVE METABOLIC PANEL
ALT: 14 U/L (ref 0–53)
AST: 15 U/L (ref 0–37)
Albumin: 3.2 g/dL — ABNORMAL LOW (ref 3.5–5.2)
Calcium: 9.5 mg/dL (ref 8.4–10.5)
Sodium: 137 mEq/L (ref 135–145)
Total Protein: 7.4 g/dL (ref 6.0–8.3)

## 2011-10-31 LAB — URINE MICROSCOPIC-ADD ON

## 2011-10-31 LAB — CBC
MCH: 32.3 pg (ref 26.0–34.0)
MCHC: 34.3 g/dL (ref 30.0–36.0)
Platelets: 170 10*3/uL (ref 150–400)

## 2011-10-31 LAB — SURGICAL PCR SCREEN: Staphylococcus aureus: NEGATIVE

## 2011-10-31 LAB — APTT: aPTT: 33 seconds (ref 24–37)

## 2011-10-31 NOTE — Progress Notes (Signed)
Patient informed Nurse that he recently had a stress test. Patient denied having a cardiac cath. Patient also had a sleep apnea test but does not wear a machine instead he wears 2.5 liters of oxygen at bedtime.

## 2011-11-01 ENCOUNTER — Other Ambulatory Visit: Payer: Self-pay | Admitting: *Deleted

## 2011-11-01 DIAGNOSIS — I82409 Acute embolism and thrombosis of unspecified deep veins of unspecified lower extremity: Secondary | ICD-10-CM

## 2011-11-01 MED ORDER — DEXTROSE 5 % IV SOLN
1.5000 g | INTRAVENOUS | Status: AC
  Administered 2011-11-02: 1.5 g via INTRAVENOUS
  Filled 2011-11-01: qty 1.5

## 2011-11-01 MED ORDER — RIVAROXABAN 20 MG PO TABS: 20.0000 mg | ORAL_TABLET | Freq: Every morning | ORAL | Status: DC

## 2011-11-01 NOTE — Consult Note (Signed)
Anesthesia chart review: Patient is a 78 year old male scheduled for right VATS, pleural biopsy, possible talc pleurodesis, possible Pleurx catheter placement for right pleural effusion (possible malignant) on 11/02/2011 by Dr. Dorris Fetch. History includes incisional hernia repair 06/12/11, TURP 01/25/11, AAA repair 11/14/10, smoking, asbestos exposure, GERD, OSA, DVT , HTN, COPD, anxiety, RLE DVT 08/2011 being treated with Xarelto, positive factor V Leiden mutation and lupus anticoagulant.  Oncologist is Dr. Myna Hidalgo.  PCP is Dr. Charlynn Court.  Chest x-ray on 10/31/2011 showed slight increase in small appearing right pleural effusion with associated basilar atelectasis.  EKGs on 10/31/11 showed SB/SR, LAFB, non-specific intra-ventricular conduction delay, non-specific ST/T wave changes.  Probably not significantly changed since 06/17/11.  Echo on 06/18/11 showed: - Left ventricle: The cavity size was normal. Systolic function was normal. The estimated ejection fraction was in the range of 55% to 60%. Wall motion was normal; there were no regional wall motion abnormalities. Doppler parameters are consistent with abnormal left ventricular relaxation (grade 1 diastolic dysfunction). - Mitral valve: trivial regurgitation. - Aortic valve: Trivial regurgitation. - Left atrium: The atrium was mildly dilated.  Nuclear stress test on 09/14/10 showed: Small fixed basal inferior perfusion defect with normal wall motion. I suspect that this is diaphragmatic attenuation. Normal wall motion. No ischemia. Low risk study.  EF 62%.  Dr. Dorris Fetch and has instructed him to hold Xarelto on the day before surgery and on the day of surgery.  Labs noted.  Reviewed DVT history with Dr. Noreene Larsson.  Plan to proceed if no significant change in his status.  Shonna Chock, PA-C

## 2011-11-02 ENCOUNTER — Encounter (HOSPITAL_COMMUNITY)
Admission: RE | Disposition: A | Discharge status: Home Health | Payer: Self-pay | Source: Ambulatory Visit | Attending: Thoracic Surgery (Cardiothoracic Vascular Surgery)

## 2011-11-02 ENCOUNTER — Inpatient Hospital Stay (HOSPITAL_COMMUNITY)
Admission: RE | Admit: 2011-11-02 | Discharge: 2011-11-06 | DRG: 163 | Disposition: A | Discharge status: Home Health | Payer: Medicare Other | Source: Ambulatory Visit | Attending: Thoracic Surgery (Cardiothoracic Vascular Surgery) | Admitting: Thoracic Surgery (Cardiothoracic Vascular Surgery)

## 2011-11-02 ENCOUNTER — Encounter (HOSPITAL_COMMUNITY): Payer: Self-pay | Admitting: Vascular Surgery

## 2011-11-02 ENCOUNTER — Other Ambulatory Visit: Payer: Self-pay | Admitting: *Deleted

## 2011-11-02 ENCOUNTER — Ambulatory Visit (HOSPITAL_COMMUNITY): Payer: Medicare Other | Admitting: Vascular Surgery

## 2011-11-02 ENCOUNTER — Inpatient Hospital Stay (HOSPITAL_COMMUNITY): Payer: Medicare Other

## 2011-11-02 ENCOUNTER — Ambulatory Visit: Payer: Medicare Other | Admitting: Neurology

## 2011-11-02 ENCOUNTER — Encounter (HOSPITAL_COMMUNITY): Payer: Self-pay | Admitting: Certified Registered Nurse Anesthetist

## 2011-11-02 DIAGNOSIS — Z23 Encounter for immunization: Secondary | ICD-10-CM

## 2011-11-02 DIAGNOSIS — I4891 Unspecified atrial fibrillation: Secondary | ICD-10-CM | POA: Diagnosis present

## 2011-11-02 DIAGNOSIS — Z01818 Encounter for other preprocedural examination: Secondary | ICD-10-CM

## 2011-11-02 DIAGNOSIS — Z01810 Encounter for preprocedural cardiovascular examination: Secondary | ICD-10-CM

## 2011-11-02 DIAGNOSIS — I714 Abdominal aortic aneurysm, without rupture, unspecified: Secondary | ICD-10-CM | POA: Diagnosis present

## 2011-11-02 DIAGNOSIS — J449 Chronic obstructive pulmonary disease, unspecified: Secondary | ICD-10-CM | POA: Diagnosis present

## 2011-11-02 DIAGNOSIS — Z77090 Contact with and (suspected) exposure to asbestos: Secondary | ICD-10-CM

## 2011-11-02 DIAGNOSIS — Z79899 Other long term (current) drug therapy: Secondary | ICD-10-CM

## 2011-11-02 DIAGNOSIS — Z86718 Personal history of other venous thrombosis and embolism: Secondary | ICD-10-CM

## 2011-11-02 DIAGNOSIS — J9620 Acute and chronic respiratory failure, unspecified whether with hypoxia or hypercapnia: Secondary | ICD-10-CM | POA: Diagnosis not present

## 2011-11-02 DIAGNOSIS — Z01812 Encounter for preprocedural laboratory examination: Secondary | ICD-10-CM

## 2011-11-02 DIAGNOSIS — J90 Pleural effusion, not elsewhere classified: Secondary | ICD-10-CM

## 2011-11-02 DIAGNOSIS — Z9981 Dependence on supplemental oxygen: Secondary | ICD-10-CM

## 2011-11-02 DIAGNOSIS — F3289 Other specified depressive episodes: Secondary | ICD-10-CM | POA: Diagnosis present

## 2011-11-02 DIAGNOSIS — I82409 Acute embolism and thrombosis of unspecified deep veins of unspecified lower extremity: Secondary | ICD-10-CM

## 2011-11-02 DIAGNOSIS — Z87891 Personal history of nicotine dependence: Secondary | ICD-10-CM

## 2011-11-02 DIAGNOSIS — Z7901 Long term (current) use of anticoagulants: Secondary | ICD-10-CM

## 2011-11-02 DIAGNOSIS — F329 Major depressive disorder, single episode, unspecified: Secondary | ICD-10-CM | POA: Diagnosis present

## 2011-11-02 DIAGNOSIS — Z9181 History of falling: Secondary | ICD-10-CM

## 2011-11-02 DIAGNOSIS — J4489 Other specified chronic obstructive pulmonary disease: Secondary | ICD-10-CM | POA: Diagnosis present

## 2011-11-02 DIAGNOSIS — I440 Atrioventricular block, first degree: Secondary | ICD-10-CM | POA: Diagnosis not present

## 2011-11-02 DIAGNOSIS — K5900 Constipation, unspecified: Secondary | ICD-10-CM | POA: Diagnosis not present

## 2011-11-02 DIAGNOSIS — K219 Gastro-esophageal reflux disease without esophagitis: Secondary | ICD-10-CM | POA: Diagnosis present

## 2011-11-02 DIAGNOSIS — F411 Generalized anxiety disorder: Secondary | ICD-10-CM | POA: Diagnosis present

## 2011-11-02 DIAGNOSIS — I10 Essential (primary) hypertension: Secondary | ICD-10-CM | POA: Diagnosis present

## 2011-11-02 DIAGNOSIS — G4730 Sleep apnea, unspecified: Secondary | ICD-10-CM | POA: Diagnosis present

## 2011-11-02 HISTORY — PX: PLEURAL EFFUSION DRAINAGE: SHX5099

## 2011-11-02 HISTORY — PX: CHEST TUBE INSERTION: SHX231

## 2011-11-02 HISTORY — PX: PLEURAL BIOPSY: SHX5082

## 2011-11-02 HISTORY — PX: DECORTICATION: SHX5101

## 2011-11-02 HISTORY — PX: VIDEO ASSISTED THORACOSCOPY: SHX5073

## 2011-11-02 SURGERY — VIDEO ASSISTED THORACOSCOPY
Anesthesia: General | Site: Chest | Laterality: Right | Wound class: Clean Contaminated

## 2011-11-02 MED ORDER — KCL IN DEXTROSE-NACL 20-5-0.9 MEQ/L-%-% IV SOLN
INTRAVENOUS | Status: DC
  Administered 2011-11-02 – 2011-11-03 (×3): via INTRAVENOUS
  Filled 2011-11-02 (×4): qty 1000

## 2011-11-02 MED ORDER — ROCURONIUM BROMIDE 100 MG/10ML IV SOLN
INTRAVENOUS | Status: DC | PRN
  Administered 2011-11-02: 50 mg via INTRAVENOUS

## 2011-11-02 MED ORDER — HYDROMORPHONE HCL PF 1 MG/ML IJ SOLN
INTRAMUSCULAR | Status: AC
  Filled 2011-11-02: qty 2

## 2011-11-02 MED ORDER — LACTATED RINGERS IV SOLN
INTRAVENOUS | Status: DC
  Administered 2011-11-02: 13:00:00 via INTRAVENOUS

## 2011-11-02 MED ORDER — DEXTROSE 5 % IV SOLN
1.5000 g | Freq: Two times a day (BID) | INTRAVENOUS | Status: AC
  Administered 2011-11-03 (×2): 1.5 g via INTRAVENOUS
  Filled 2011-11-02 (×2): qty 1.5

## 2011-11-02 MED ORDER — OXYCODONE HCL 5 MG/5ML PO SOLN: 5.0000 mg | Freq: Once | ORAL | Status: DC | PRN

## 2011-11-02 MED ORDER — SENNOSIDES 8.6 MG PO TABS: 2.0000 | ORAL_TABLET | Freq: Two times a day (BID) | ORAL | Status: DC

## 2011-11-02 MED ORDER — FENTANYL 10 MCG/ML IV SOLN
INTRAVENOUS | Status: DC
  Administered 2011-11-02: 18:00:00 via INTRAVENOUS
  Administered 2011-11-03: 100 ug via INTRAVENOUS
  Administered 2011-11-03: 170 ug via INTRAVENOUS
  Administered 2011-11-03: 20 ug via INTRAVENOUS
  Administered 2011-11-03: 160 ug via INTRAVENOUS
  Administered 2011-11-03: 80 ug via INTRAVENOUS
  Administered 2011-11-04: 70 ug via INTRAVENOUS
  Administered 2011-11-04: 30 ug via INTRAVENOUS
  Administered 2011-11-04: 140 ug via INTRAVENOUS
  Filled 2011-11-02 (×3): qty 50

## 2011-11-02 MED ORDER — FENTANYL CITRATE 0.05 MG/ML IJ SOLN
INTRAMUSCULAR | Status: DC | PRN
  Administered 2011-11-02 (×2): 50 ug via INTRAVENOUS
  Administered 2011-11-02 (×2): 100 ug via INTRAVENOUS
  Administered 2011-11-02 (×4): 50 ug via INTRAVENOUS

## 2011-11-02 MED ORDER — ONDANSETRON HCL 4 MG/2ML IJ SOLN: 4.0000 mg | Freq: Four times a day (QID) | INTRAMUSCULAR | Status: DC | PRN

## 2011-11-02 MED ORDER — SERTRALINE HCL 50 MG PO TABS
50.0000 mg | ORAL_TABLET | Freq: Every morning | ORAL | Status: DC
Start: 2011-11-03 — End: 2011-11-06
  Administered 2011-11-03 – 2011-11-06 (×4): 50 mg via ORAL
  Filled 2011-11-02 (×4): qty 1

## 2011-11-02 MED ORDER — NEOSTIGMINE METHYLSULFATE 1 MG/ML IJ SOLN
INTRAMUSCULAR | Status: DC | PRN
  Administered 2011-11-02: 5 mg via INTRAVENOUS

## 2011-11-02 MED ORDER — EPHEDRINE SULFATE 50 MG/ML IJ SOLN
INTRAMUSCULAR | Status: DC | PRN
  Administered 2011-11-02 (×3): 5 mg via INTRAVENOUS

## 2011-11-02 MED ORDER — HYDROMORPHONE HCL PF 1 MG/ML IJ SOLN
0.2500 mg | INTRAMUSCULAR | Status: DC | PRN
  Administered 2011-11-02 (×3): 0.5 mg via INTRAVENOUS

## 2011-11-02 MED ORDER — ADULT MULTIVITAMIN W/MINERALS CH
1.0000 | ORAL_TABLET | Freq: Every day | ORAL | Status: DC
  Administered 2011-11-03 – 2011-11-06 (×4): 1 via ORAL
  Filled 2011-11-02 (×5): qty 1

## 2011-11-02 MED ORDER — FENTANYL CITRATE 0.05 MG/ML IJ SOLN
INTRAMUSCULAR | Status: AC
  Filled 2011-11-02: qty 2

## 2011-11-02 MED ORDER — NALOXONE HCL 0.4 MG/ML IJ SOLN: 0.4000 mg | INTRAMUSCULAR | Status: DC | PRN

## 2011-11-02 MED ORDER — ACETAMINOPHEN 10 MG/ML IV SOLN
1000.0000 mg | Freq: Four times a day (QID) | INTRAVENOUS | Status: AC
  Administered 2011-11-02 – 2011-11-03 (×4): 1000 mg via INTRAVENOUS
  Filled 2011-11-02 (×4): qty 100

## 2011-11-02 MED ORDER — ALBUTEROL SULFATE HFA 108 (90 BASE) MCG/ACT IN AERS
2.0000 | INHALATION_SPRAY | Freq: Four times a day (QID) | RESPIRATORY_TRACT | Status: DC | PRN
  Filled 2011-11-02: qty 6.7

## 2011-11-02 MED ORDER — FENTANYL CITRATE 0.05 MG/ML IJ SOLN
50.0000 ug | INTRAMUSCULAR | Status: DC | PRN
  Administered 2011-11-02: 50 ug via INTRAVENOUS

## 2011-11-02 MED ORDER — OXYCODONE HCL 5 MG PO TABS
5.0000 mg | ORAL_TABLET | ORAL | Status: AC | PRN
  Administered 2011-11-03: 10 mg via ORAL
  Filled 2011-11-02 (×2): qty 2

## 2011-11-02 MED ORDER — VECURONIUM BROMIDE 10 MG IV SOLR
INTRAVENOUS | Status: DC | PRN
  Administered 2011-11-02: 3 mg via INTRAVENOUS
  Administered 2011-11-02: 2 mg via INTRAVENOUS

## 2011-11-02 MED ORDER — PROPOFOL 10 MG/ML IV BOLUS
INTRAVENOUS | Status: DC | PRN
  Administered 2011-11-02: 200 mg via INTRAVENOUS

## 2011-11-02 MED ORDER — OXYCODONE-ACETAMINOPHEN 5-325 MG PO TABS
1.0000 | ORAL_TABLET | ORAL | Status: DC | PRN
  Administered 2011-11-04 – 2011-11-06 (×3): 2 via ORAL
  Filled 2011-11-02 (×3): qty 2

## 2011-11-02 MED ORDER — DIPHENHYDRAMINE HCL 50 MG/ML IJ SOLN: 12.5000 mg | Freq: Four times a day (QID) | INTRAMUSCULAR | Status: DC | PRN

## 2011-11-02 MED ORDER — OXYCODONE HCL 5 MG PO TABS: 5.0000 mg | ORAL_TABLET | Freq: Once | ORAL | Status: DC | PRN

## 2011-11-02 MED ORDER — LACTATED RINGERS IV SOLN
INTRAVENOUS | Status: DC | PRN
  Administered 2011-11-02 (×2): via INTRAVENOUS

## 2011-11-02 MED ORDER — MIDAZOLAM HCL 2 MG/2ML IJ SOLN
INTRAMUSCULAR | Status: AC
  Filled 2011-11-02: qty 2

## 2011-11-02 MED ORDER — ARTIFICIAL TEARS OP OINT
TOPICAL_OINTMENT | OPHTHALMIC | Status: DC | PRN
  Administered 2011-11-02: 1 via OPHTHALMIC

## 2011-11-02 MED ORDER — LIDOCAINE HCL (CARDIAC) 20 MG/ML IV SOLN
INTRAVENOUS | Status: DC | PRN
  Administered 2011-11-02: 60 mg via INTRAVENOUS

## 2011-11-02 MED ORDER — TIOTROPIUM BROMIDE MONOHYDRATE 18 MCG IN CAPS
18.0000 ug | ORAL_CAPSULE | Freq: Every day | RESPIRATORY_TRACT | Status: DC
  Administered 2011-11-03 – 2011-11-06 (×4): 18 ug via RESPIRATORY_TRACT
  Filled 2011-11-02: qty 5

## 2011-11-02 MED ORDER — METOPROLOL TARTRATE 12.5 MG HALF TABLET
12.5000 mg | ORAL_TABLET | Freq: Two times a day (BID) | ORAL | Status: DC
  Administered 2011-11-03 – 2011-11-06 (×6): 12.5 mg via ORAL
  Filled 2011-11-02 (×8): qty 1

## 2011-11-02 MED ORDER — DEXAMETHASONE SODIUM PHOSPHATE 4 MG/ML IJ SOLN
INTRAMUSCULAR | Status: DC | PRN
  Administered 2011-11-02: 8 mg via INTRAVENOUS

## 2011-11-02 MED ORDER — MIDAZOLAM HCL 2 MG/2ML IJ SOLN
1.0000 mg | INTRAMUSCULAR | Status: DC | PRN
  Administered 2011-11-02: 2 mg via INTRAVENOUS

## 2011-11-02 MED ORDER — METOCLOPRAMIDE HCL 5 MG/ML IJ SOLN: 10.0000 mg | Freq: Once | INTRAMUSCULAR | Status: DC | PRN

## 2011-11-02 MED ORDER — TALC 5 G PL SUSR
INTRAPLEURAL | Status: AC
Start: 2011-11-02 — End: 2011-11-02
  Filled 2011-11-02: qty 5

## 2011-11-02 MED ORDER — 0.9 % SODIUM CHLORIDE (POUR BTL) OPTIME
TOPICAL | Status: DC | PRN
  Administered 2011-11-02: 2000 mL

## 2011-11-02 MED ORDER — ONDANSETRON HCL 4 MG/2ML IJ SOLN
INTRAMUSCULAR | Status: DC | PRN
  Administered 2011-11-02: 4 mg via INTRAVENOUS

## 2011-11-02 MED ORDER — SENNA 8.6 MG PO TABS
2.0000 | ORAL_TABLET | Freq: Two times a day (BID) | ORAL | Status: DC
  Administered 2011-11-03 – 2011-11-06 (×7): 17.2 mg via ORAL
  Filled 2011-11-02 (×9): qty 2

## 2011-11-02 MED ORDER — GLYCOPYRROLATE 0.2 MG/ML IJ SOLN
INTRAMUSCULAR | Status: DC | PRN
  Administered 2011-11-02: .8 mg via INTRAVENOUS

## 2011-11-02 MED ORDER — DIPHENHYDRAMINE HCL 12.5 MG/5ML PO ELIX
12.5000 mg | ORAL_SOLUTION | Freq: Four times a day (QID) | ORAL | Status: DC | PRN
  Filled 2011-11-02: qty 5

## 2011-11-02 MED ORDER — POTASSIUM CHLORIDE 10 MEQ/50ML IV SOLN: 10.0000 meq | Freq: Every day | INTRAVENOUS | Status: DC | PRN

## 2011-11-02 MED ORDER — SODIUM CHLORIDE 0.9 % IJ SOLN: 9.0000 mL | INTRAMUSCULAR | Status: DC | PRN

## 2011-11-02 MED ORDER — TRAMADOL HCL 50 MG PO TABS: 50.0000 mg | ORAL_TABLET | Freq: Four times a day (QID) | ORAL | Status: DC | PRN

## 2011-11-02 SURGICAL SUPPLY — 84 items
ADH SKN CLS APL DERMABOND .7 (GAUZE/BANDAGES/DRESSINGS) ×2
APPLIER CLIP ROT 10 11.4 M/L (STAPLE)
APR CLP MED LRG 11.4X10 (STAPLE)
BAG SPEC RTRVL LRG 6X4 10 (ENDOMECHANICALS)
CANISTER SUCTION 2500CC (MISCELLANEOUS) ×6 IMPLANT
CATH KIT ON Q 5IN SLV (PAIN MANAGEMENT) IMPLANT
CATH THORACIC 28FR (CATHETERS) IMPLANT
CATH THORACIC 28FR RT ANG (CATHETERS) ×1 IMPLANT
CATH THORACIC 36FR (CATHETERS) IMPLANT
CATH THORACIC 36FR RT ANG (CATHETERS) IMPLANT
CLIP APPLIE ROT 10 11.4 M/L (STAPLE) IMPLANT
CLIP TI MEDIUM 6 (CLIP) ×3 IMPLANT
CLOTH BEACON ORANGE TIMEOUT ST (SAFETY) ×3 IMPLANT
CONN ST 1/4X3/8  BEN (MISCELLANEOUS) ×1
CONN ST 1/4X3/8 BEN (MISCELLANEOUS) ×1 IMPLANT
CONN Y 3/8X3/8X3/8  BEN (MISCELLANEOUS)
CONN Y 3/8X3/8X3/8 BEN (MISCELLANEOUS) ×1 IMPLANT
CONT SPEC 4OZ CLIKSEAL STRL BL (MISCELLANEOUS) ×6 IMPLANT
COVER SURGICAL LIGHT HANDLE (MISCELLANEOUS) ×3 IMPLANT
DERMABOND ADVANCED (GAUZE/BANDAGES/DRESSINGS) ×1
DERMABOND ADVANCED .7 DNX12 (GAUZE/BANDAGES/DRESSINGS) ×1 IMPLANT
DRAIN CHANNEL 28F RND 3/8 FF (WOUND CARE) ×2 IMPLANT
DRAPE C-ARM 42X72 X-RAY (DRAPES) ×1 IMPLANT
DRAPE LAPAROSCOPIC ABDOMINAL (DRAPES) ×3 IMPLANT
DRAPE WARM FLUID 44X44 (DRAPE) ×4 IMPLANT
DRSG TEGADERM 4X4.75 (GAUZE/BANDAGES/DRESSINGS) ×2 IMPLANT
ELECT REM PT RETURN 9FT ADLT (ELECTROSURGICAL) ×3
ELECTRODE REM PT RTRN 9FT ADLT (ELECTROSURGICAL) ×2 IMPLANT
GLOVE BIO SURGEON STRL SZ 6.5 (GLOVE) ×2 IMPLANT
GLOVE BIOGEL PI IND STRL 6.5 (GLOVE) ×2 IMPLANT
GLOVE BIOGEL PI IND STRL 7.0 (GLOVE) ×2 IMPLANT
GLOVE BIOGEL PI INDICATOR 6.5 (GLOVE) ×2
GLOVE BIOGEL PI INDICATOR 7.0 (GLOVE) ×2
GLOVE EUDERMIC 7 POWDERFREE (GLOVE) ×6 IMPLANT
GOWN PREVENTION PLUS XLARGE (GOWN DISPOSABLE) ×3 IMPLANT
GOWN STRL NON-REIN LRG LVL3 (GOWN DISPOSABLE) ×6 IMPLANT
HEMOSTAT SURGICEL 2X14 (HEMOSTASIS) IMPLANT
KIT BASIN OR (CUSTOM PROCEDURE TRAY) ×3 IMPLANT
KIT PLEURX DRAIN CATH 1000ML (MISCELLANEOUS) ×3 IMPLANT
KIT PLEURX DRAIN CATH 15.5FR (DRAIN) ×3 IMPLANT
KIT ROOM TURNOVER OR (KITS) ×3 IMPLANT
KIT SUCTION CATH 14FR (SUCTIONS) ×3 IMPLANT
NS IRRIG 1000ML POUR BTL (IV SOLUTION) ×6 IMPLANT
PACK CHEST (CUSTOM PROCEDURE TRAY) ×3 IMPLANT
PACK GENERAL/GYN (CUSTOM PROCEDURE TRAY) ×1 IMPLANT
PAD ARMBOARD 7.5X6 YLW CONV (MISCELLANEOUS) ×8 IMPLANT
POUCH ENDO CATCH II 15MM (MISCELLANEOUS) IMPLANT
POUCH SPECIMEN RETRIEVAL 10MM (ENDOMECHANICALS) IMPLANT
SEALANT PROGEL (MISCELLANEOUS) IMPLANT
SEALANT SURG COSEAL 4ML (VASCULAR PRODUCTS) IMPLANT
SEALANT SURG COSEAL 8ML (VASCULAR PRODUCTS) IMPLANT
SET DRAINAGE LINE (MISCELLANEOUS) IMPLANT
SOLUTION ANTI FOG 6CC (MISCELLANEOUS) ×6 IMPLANT
SPECIMEN JAR MEDIUM (MISCELLANEOUS) ×3 IMPLANT
SPONGE DRAIN TRACH 4X4 STRL 2S (GAUZE/BANDAGES/DRESSINGS) ×2 IMPLANT
SPONGE GAUZE 4X4 12PLY (GAUZE/BANDAGES/DRESSINGS) ×3 IMPLANT
SPONGE INTESTINAL PEANUT (DISPOSABLE) ×2 IMPLANT
SUT ETHILON 3 0 FSL (SUTURE) ×3 IMPLANT
SUT PROLENE 4 0 RB 1 (SUTURE)
SUT PROLENE 4-0 RB1 .5 CRCL 36 (SUTURE) IMPLANT
SUT SILK  1 MH (SUTURE) ×3
SUT SILK 1 MH (SUTURE) ×5 IMPLANT
SUT SILK 2 0SH CR/8 30 (SUTURE) IMPLANT
SUT SILK 3 0SH CR/8 30 (SUTURE) IMPLANT
SUT VIC AB 1 CTX 36 (SUTURE)
SUT VIC AB 1 CTX36XBRD ANBCTR (SUTURE) IMPLANT
SUT VIC AB 2-0 CTX 36 (SUTURE) IMPLANT
SUT VIC AB 2-0 UR6 27 (SUTURE) ×2 IMPLANT
SUT VIC AB 3-0 MH 27 (SUTURE) IMPLANT
SUT VIC AB 3-0 SH 27 (SUTURE) ×3
SUT VIC AB 3-0 SH 27X BRD (SUTURE) ×1 IMPLANT
SUT VIC AB 3-0 X1 27 (SUTURE) ×6 IMPLANT
SUT VICRYL 2 TP 1 (SUTURE) IMPLANT
SWAB COLLECTION DEVICE MRSA (MISCELLANEOUS) IMPLANT
SYSTEM SAHARA CHEST DRAIN ATS (WOUND CARE) ×3 IMPLANT
TAPE CLOTH 4X10 WHT NS (GAUZE/BANDAGES/DRESSINGS) ×3 IMPLANT
TAPE CLOTH SURG 4X10 WHT LF (GAUZE/BANDAGES/DRESSINGS) ×2 IMPLANT
TIP APPLICATOR SPRAY EXTEND 16 (VASCULAR PRODUCTS) IMPLANT
TOWEL OR 17X24 6PK STRL BLUE (TOWEL DISPOSABLE) ×3 IMPLANT
TOWEL OR 17X26 10 PK STRL BLUE (TOWEL DISPOSABLE) ×3 IMPLANT
TRAP SPECIMEN MUCOUS 40CC (MISCELLANEOUS) IMPLANT
TRAY FOLEY CATH 14FRSI W/METER (CATHETERS) ×3 IMPLANT
TUBE ANAEROBIC SPECIMEN COL (MISCELLANEOUS) IMPLANT
WATER STERILE IRR 1000ML POUR (IV SOLUTION) ×6 IMPLANT

## 2011-11-02 NOTE — Transfer of Care (Signed)
Immediate Anesthesia Transfer of Care Note  Patient: Steve [name]  Procedure(s) Performed: Procedure(s) (LRB) with comments: VIDEO ASSISTED THORACOSCOPY (Right) PLEURAL BIOPSY (Right) - Parietal and visceral biopsies INSERTION PLEURAL DRAINAGE CATHETER (Right) DECORTICATION (Right) DRAINAGE OF PLEURAL EFFUSION (Right)  Patient Location: PACU  Anesthesia Type: General  Level of Consciousness: awake, alert , oriented and patient cooperative  Airway & Oxygen Therapy: Patient Spontanous Breathing and Patient connected to face mask oxygen  Post-op Assessment: Report given to PACU RN, Post -op Vital signs reviewed and stable and Patient moving all extremities X 4  Post vital signs: Reviewed and stable  Complications: No apparent anesthesia complications

## 2011-11-02 NOTE — Anesthesia Procedure Notes (Addendum)
Procedure Name: Intubation Date/Time: 11/02/2011 1:30 PM Performed by: Gayla Medicus Pre-anesthesia Checklist: Emergency Drugs available, Patient identified, Suction available and Patient being monitored Patient Re-evaluated:Patient Re-evaluated prior to inductionOxygen Delivery Method: Circle system utilized Preoxygenation: Pre-oxygenation with 100% oxygen Intubation Type: IV induction Ventilation: Mask ventilation without difficulty and Oral airway inserted - appropriate to patient size Laryngoscope Size: Mac and 3 Grade View: Grade I Tube type: Oral Endobronchial tube: Double lumen EBT, EBT position confirmed by auscultation, EBT position confirmed by fiberoptic bronchoscope and Left and 39 Fr Number of attempts: 1 Airway Equipment and Method: Stylet Placement Confirmation: ETT inserted through vocal cords under direct vision,  positive ETCO2 and breath sounds checked- equal and bilateral Tube secured with: Tape Dental Injury: Teeth and Oropharynx as per pre-operative assessment

## 2011-11-02 NOTE — Preoperative (Signed)
Beta Blockers   Reason not to administer Beta Blockers:Not Applicable 

## 2011-11-02 NOTE — H&P (View-Only) (Signed)
PCP is Letitia Libra, Ala Dach, MD Referring Provider is Myna Hidalgo Rose Phi, MD  Chief Complaint  Patient presents with  . Pleural Effusion    eval for VATS BX    HPI: Steve [name] is a 78 year old gentleman presents with chief complaint of shortness of breath  Steve [name] is a 78 year old gentleman with multiple medical problems and long-term history of tobacco abuse. He also has a remote history of exposure to asbestos. He recently has been having problems with frequent falls. During his workup at that he was found to have a DVT for which he is being treated with Xarelto. He also has been found to have a right pleural effusion. He had a thoracentesis for this on 09/25/11. Cytology showed highly atypical cells, suspicious for malignancy. Cells were positive for diabetes-1, calretinin and cytokeratin 5/6. They were negative for Napsin A, TTF-1 and MOC-31. He also recently has had a bone marrow biopsy which showed a slightly hypercellular bone marrow for age with plasma cell dyscrasia. Dr. Myna Hidalgo has referred him for pleural biopsy.   Past Medical History  Diagnosis Date  . Prostate enlargement   . History of chicken pox     childhood  . GERD (gastroesophageal reflux disease)   . Arthritis     knee and back  . AAA (abdominal aortic aneurysm)     stress test 09/14/10 [facility]  . Anxiety   . Dysrhythmia     hx of atrial fib post op x 2 days   . COPD (chronic obstructive pulmonary disease)   . Shortness of breath     with exertion   . Recurrent upper respiratory infection (URI)     productive cough- white phlegm- no fever  . Sleep apnea      12/12 sleep study,SEVERE per study  Dr Delton Coombes- states doesnt wear machine on regular basis- setting BiPAP 9/9  . Hypertension     chest x ray 12/12 EP[facility]- repeated 06/06/11, EKG 11/12 [facility]  . DVT (deep venous thrombosis) 08/2011    Past Surgical History  Procedure Date  . Knee surgery 1957    left knee arthotomy  . Abdominal aortic aneurysm repair  11/14/2010    AAA Repair    Dr Arbie Cookey  . Transurethral resection of prostate 01/25/2011    TURP  . Incisional hernia repair 06/12/2011    Procedure: HERNIA REPAIR INCISIONAL;  Surgeon: Velora Heckler, MD;  Location: [facility];  Service: General;  Laterality: N/A;  Open Repair Ventral Incisional Hernia with Mesh    Family History  Problem Relation Age of Onset  . Arthritis Mother   . Heart failure Mother   . Hypertension Mother   . Diabetes Mother   . Heart failure Daughter     deceased age 54  . Other Daughter     deceased age 19 MVA    Social History History  Substance Use Topics  . Smoking status: Current Every Day Smoker -- 1.0 packs/day    Types: Cigarettes  . Smokeless tobacco: Never Used   Comment: started smoking at age 47.  Currently smoking 1 ppd.  . Alcohol Use: No    Current Outpatient Prescriptions  Medication Sig Dispense Refill  . albuterol (PROVENTIL HFA;VENTOLIN HFA) 108 (90 BASE) MCG/ACT inhaler Inhale 2 puffs into the lungs every 6 (six) hours as needed for wheezing.  1 Inhaler  2  . ALPRAZolam (XANAX) 0.25 MG tablet Take 0.25 mg by mouth at bedtime as needed.      Marland Kitchen  metoprolol tartrate (LOPRESSOR) 12.5 mg TABS Take 0.5 tablets (12.5 mg total) by mouth 2 (two) times daily.  30 tablet  0  . Multiple Vitamin (MULTIVITAMIN WITH MINERALS) TABS Take 1 tablet by mouth daily.      . Rivaroxaban (XARELTO) 20 MG TABS Take 20 mg by mouth every morning.      . senna (SENOKOT) 8.6 MG tablet Take 2 tablets by mouth 2 (two) times daily.       . sertraline (ZOLOFT) 50 MG tablet Take 50 mg by mouth every morning.      . tiotropium (SPIRIVA) 18 MCG inhalation capsule Place 18 mcg into inhaler and inhale daily.        Allergies  Allergen Reactions  . Lyrica (Pregabalin) Hives    Review of Systems  Respiratory: Positive for apnea (uses 02 at night, no CPAP), chest tightness (pleuritic right sided CP with cough x 1 year) and shortness of breath.   Cardiovascular:       Recent  dvt, atrial fibrillation  Neurological: Negative for dizziness and syncope.       Frequent falls  Psychiatric/Behavioral:       Anxiety  All other systems reviewed and are negative.    BP 142/70  Pulse 52  Resp 16  Ht 5\' 8"  (1.727 m)  Wt 182 lb (82.555 kg)  BMI 27.67 kg/m2  SpO2 96% Physical Exam  Vitals reviewed. Constitutional: He is oriented to person, place, and time. He appears well-developed and well-nourished.  HENT:  Head: Normocephalic and atraumatic.  Eyes: EOM are normal. Pupils are equal, round, and reactive to light.  Neck: Neck supple. No thyromegaly present.  Cardiovascular:  No murmur heard.      Irregularly irregular  Pulmonary/Chest:       Diminished BS right base  Abdominal: Soft. There is no tenderness.  Musculoskeletal: He exhibits no edema.  Lymphadenopathy:    He has no cervical adenopathy.  Neurological: He is alert and oriented to person, place, and time. No cranial nerve deficit.  Skin: Skin is warm and dry.     Diagnostic Tests: CT chest 09/20/11  *RADIOLOGY REPORT*  Clinical Data: Cough, asbestos exposure, concern for lung cancer.  Smoking history.  CT CHEST, ABDOMEN AND PELVIS WITH CONTRAST  Technique: Multidetector CT imaging of the chest, abdomen and  pelvis was performed following the standard protocol during bolus  administration of intravenous contrast.  Contrast: OMNIPAQUE IOHEXOL 300 MG/ML SOLN  Comparison: CT thorax 06/17/2011  CT CHEST  Findings: No axillary or supraclavicular lymphadenopathy. No  mediastinal or hilar lymphadenopathy. Coronary calcifications are  present. Esophagus is normal.  There is an increasing right pleural effusion compared to CT of  06/17/2011. There is central lobular emphysema in the right upper  lobe. Within the in the superior segment of the left lower lobe,  11 mm nodule (image 30) is not changed from 11 mm on prior. More  remote CT lesion also measures 9 mm (07/21/10). Lesion has low    attenuation suggesting a benign cystic lesion or hamartoma.  IMPRESSION:  1. No evidence of thoracic malignancy.  2. Increasing right pleural effusion.  3. Stable low density nodule in the left lower lobe likely  represents a hamartoma or bronchogenic cyst.  CT ABDOMEN AND PELVIS  Findings: No focal hepatic lesion. The gallbladder, pancreas,  spleen, and right adrenal gland are normal. There is a tiny nodule  in the left adrenal gland measuring 6 mm (image 56) which is the  too small to characterize but is unchanged from comparison CT.  The stomach, small bowel, and colon are normal.  Abdominal aorta normal caliber. No retroperitoneal or periportal  lymphadenopathy.  No peritoneal disease. Superficial to the abdominal rectus muscles  there is a flat fluid collection with simple attenuation and likely  represents a postsurgical seroma.  No free fluid the pelvis. The bladder prostate gland are normal.  No pelvic lymphadenopathy. Review of bone windows demonstrates no  aggressive osseous lesions. Chronic compression fracture at L1 with  approximate 50% loss vertebral body height. No retropulsion.  IMPRESSION:  1. No evidence of malignancy in the abdomen or pelvis.  2. Benign-appearing left adrenal cyst in the right renal cyst.  3. Fluid collection in the ventral subcutaneous tissue likely  relates to prior surgery.  Impression: 78 year old gentleman with multiple medical problems with highly atypical cells on cytology of a right pleural effusion. There is concern that he may have an underlying malignancy. He does have a history of smoking as well as exposure to asbestos. Complicating matters is a recent deep venous thrombosis which is being treated with Xarelto.  I discussed with Mr. and Mrs. Dearinger the indications, risks, benefits, and alternatives to right VATS and pleural biopsy. They understand that this is a diagnostic procedure. They understand that it is a major operation  requiring general anesthesia and has potential risk for major morbidity or mortality. They do understand the general nature of the procedure including the need for general anesthesia, incisions to be used, general conduct of the procedure, intraoperative decision making, and expected hospital stay and overall recovery. They understand the risk include but are not limited to death, stroke, MI, DVT, PE, bleeding, possible need for transfusion, infection, recurrent pleural effusion, as well as the potential for unforeseeable complications.  We did discuss the plan would be right video-assisted thoracoscopy for pleural biopsies. We may choose to place a Pleurx catheter and/or attempt talc pleurodesis depending on the intraoperative findings. He wishes to proceed.  He will need to hold his Xarelto the day before surgery as well as the day of surgery.  Plan: Right VATS, pleural biopsy, possible pleura the cyst, possible Pleurx catheter placement on Thursday, October 24. He will hold Xarelto on the 23rd and 24th.

## 2011-11-02 NOTE — Brief Op Note (Addendum)
11/02/2011  3:03 PM  PATIENT:  Steve [name]  78 y.o. male  PRE-OPERATIVE DIAGNOSIS:  Right pleural effusion with atypical cells  POST-OPERATIVE DIAGNOSIS: Right Pleural Effusion  with atypical cells   PROCEDURE:  RIGHT VIDEO ASSISTED THORACOSCOPY,  DRAINAGE OF RIGHT PLEURAL EFFUSION,PARIETAL AND VsICERAL PLEURAL BIOPSIES, DECORTICATION, and  INSERTION RIGHT PLEURAL DRAINAGE CATHETER (PLEUR-X)  SURGEON:  Surgeon(s) and Role:    * Loreli Slot, MD - Primary  PHYSICIAN ASSISTANT: Doree Fudge PA-C   ANESTHESIA:   general  EBL:  Total I/O In: 2000 [I.V.:2000] Out: 225 [Urine:225]  BLOOD ADMINISTERED:none  DRAINS: Bard Drain placed in right pleural space   SPECIMEN:  Source of Specimen:  Parietal and visceral pleural biospsies  DISPOSITION OF SPECIMEN:  PATHOLOGY  COUNTS:  YES  PLAN OF CARE: Admit to inpatient   PATIENT DISPOSITION:  PACU - hemodynamically stable.   Delay start of Pharmacological VTE agent (>24hrs) due to surgical blood loss or risk of bleeding: yes  FINDINGS:  ~750 ml amber fluid Faint nodularity inferior parietal pleura RLL trapped with adhesions

## 2011-11-02 NOTE — Anesthesia Preprocedure Evaluation (Signed)
Anesthesia Evaluation  Patient identified by MRN, date of birth, ID band Patient awake    Reviewed: Allergy & Precautions, H&P , NPO status , Patient's Chart, lab work & pertinent test results, reviewed documented beta blocker date and time   Airway Mallampati: II TM Distance: >3 FB Neck ROM: full    Dental   Pulmonary shortness of breath, with exertion and Long-Term Oxygen Therapy, sleep apnea, Continuous Positive Airway Pressure Ventilation and Oxygen sleep apnea , pneumonia -, resolved, COPD COPD inhaler and oxygen dependent, Recent URI , Resolved, former smoker,  breath sounds clear to auscultation        Cardiovascular hypertension, Pt. on medications and Pt. on home beta blockers + dysrhythmias Rhythm:regular     Neuro/Psych PSYCHIATRIC DISORDERS Anxiety Depression negative neurological ROS     GI/Hepatic Neg liver ROS, GERD-  Medicated and Controlled,  Endo/Other  negative endocrine ROS  Renal/GU negative Renal ROS  negative genitourinary   Musculoskeletal   Abdominal   Peds  Hematology negative hematology ROS (+)   Anesthesia Other Findings See surgeon's H&P   Reproductive/Obstetrics negative OB ROS                           Anesthesia Physical Anesthesia Plan  ASA: IV  Anesthesia Plan: General   Post-op Pain Management:    Induction: Intravenous  Airway Management Planned: Double Lumen EBT  Additional Equipment: Arterial line, CVP and Ultrasound Guidance Line Placement  Intra-op Plan:   Post-operative Plan: Extubation in OR  Informed Consent: I have reviewed the patients History and Physical, chart, labs and discussed the procedure including the risks, benefits and alternatives for the proposed anesthesia with the patient or authorized representative who has indicated his/her understanding and acceptance.   Dental Advisory Given  Plan Discussed with: CRNA and  Surgeon  Anesthesia Plan Comments:         Anesthesia Quick Evaluation

## 2011-11-02 NOTE — Anesthesia Postprocedure Evaluation (Signed)
  Anesthesia Post-op Note  Patient: Steve [name]  Procedure(s) Performed: Procedure(s) (LRB) with comments: VIDEO ASSISTED THORACOSCOPY (Right) PLEURAL BIOPSY (Right) - Parietal and visceral biopsies INSERTION PLEURAL DRAINAGE CATHETER (Right) DECORTICATION (Right) DRAINAGE OF PLEURAL EFFUSION (Right)  Patient Location: PACU  Anesthesia Type: General  Level of Consciousness: sedated  Airway and Oxygen Therapy: Patient Spontanous Breathing and non-rebreather face mask  Post-op Pain: none  Post-op Assessment: Post-op Vital signs reviewed, Patient's Cardiovascular Status Stable and Respiratory Function Stable  Post-op Vital Signs: Reviewed and stable  Complications: No apparent anesthesia complications  Patient to be placed on BiPAP per Dr. Dorris Fetch, either in PACU or SICU

## 2011-11-02 NOTE — Interval H&P Note (Signed)
History and Physical Interval Note:  11/02/2011 1:05 PM  Steve [name]  has presented today for surgery, with the diagnosis of right pleural effusion  The various methods of treatment have been discussed with the patient and family. After consideration of risks, benefits and other options for treatment, the patient has consented to  Procedure(s) (LRB) with comments: VIDEO ASSISTED THORACOSCOPY (Right) PLEURAL BIOPSY (Right) TALC PLEURADESIS (Right) INSERTION PLEURAL DRAINAGE CATHETER (Right) as a surgical intervention .  The patient's history has been reviewed, patient examined, no change in status, stable for surgery.  I have reviewed the patient's chart and labs.  Questions were answered to the patient's satisfaction.     HENDRICKSON,[name]

## 2011-11-03 ENCOUNTER — Inpatient Hospital Stay (HOSPITAL_COMMUNITY): Payer: Medicare Other

## 2011-11-03 LAB — POCT I-STAT 3, ART BLOOD GAS (G3+)
Bicarbonate: 28.6 mEq/L — ABNORMAL HIGH (ref 20.0–24.0)
TCO2: 30 mmol/L (ref 0–100)
pCO2 arterial: 50.5 mmHg — ABNORMAL HIGH (ref 35.0–45.0)
pH, Arterial: 7.359 (ref 7.350–7.450)
pO2, Arterial: 64 mmHg — ABNORMAL LOW (ref 80.0–100.0)

## 2011-11-03 LAB — CBC
MCV: 95.7 fL (ref 78.0–100.0)
Platelets: 163 10*3/uL (ref 150–400)
RBC: 4.14 MIL/uL — ABNORMAL LOW (ref 4.22–5.81)
WBC: 17.2 10*3/uL — ABNORMAL HIGH (ref 4.0–10.5)

## 2011-11-03 LAB — BASIC METABOLIC PANEL
CO2: 24 mEq/L (ref 19–32)
Chloride: 103 mEq/L (ref 96–112)
Creatinine, Ser: 0.78 mg/dL (ref 0.50–1.35)
Potassium: 4.9 mEq/L (ref 3.5–5.1)

## 2011-11-03 MED ORDER — NICOTINE 7 MG/24HR TD PT24
7.0000 mg | MEDICATED_PATCH | Freq: Every day | TRANSDERMAL | Status: DC
  Filled 2011-11-03: qty 1

## 2011-11-03 MED ORDER — RIVAROXABAN 20 MG PO TABS
20.0000 mg | ORAL_TABLET | Freq: Every morning | ORAL | Status: DC
  Administered 2011-11-03 – 2011-11-05 (×3): 20 mg via ORAL
  Filled 2011-11-03 (×4): qty 1

## 2011-11-03 MED ORDER — NICOTINE 14 MG/24HR TD PT24
14.0000 mg | MEDICATED_PATCH | Freq: Every day | TRANSDERMAL | Status: DC
Start: 2011-11-03 — End: 2011-11-06
  Administered 2011-11-03 – 2011-11-06 (×3): 14 mg via TRANSDERMAL
  Filled 2011-11-03 (×4): qty 1

## 2011-11-03 MED ORDER — ALPRAZOLAM 0.25 MG PO TABS: 0.2500 mg | ORAL_TABLET | Freq: Three times a day (TID) | ORAL | Status: DC | PRN

## 2011-11-03 MED ORDER — NICOTINE 14 MG/24HR TD PT24
14.0000 mg | MEDICATED_PATCH | Freq: Every day | TRANSDERMAL | Status: DC
  Filled 2011-11-03: qty 1

## 2011-11-03 NOTE — Progress Notes (Signed)
1 Day Post-Op Procedure(s) (LRB): VIDEO ASSISTED THORACOSCOPY (Right) PLEURAL BIOPSY (Right) INSERTION PLEURAL DRAINAGE CATHETER (Right) DECORTICATION (Right) DRAINAGE OF PLEURAL EFFUSION (Right) Subjective: C/o pain from chest tube  Objective: Vital signs in last 24 hours: Temp:  [97.5 F (36.4 C)-98.6 F (37 C)] 97.8 F (36.6 C) (10/25 0755) Pulse Rate:  [47-70] 62  (10/25 0700) Cardiac Rhythm:  [-] Normal sinus rhythm (10/24 2000) Resp:  [10-21] 21  (10/25 0700) BP: (104-158)/(49-76) 125/58 mmHg (10/25 0700) SpO2:  [89 %-98 %] 97 % (10/25 0700) Arterial Line BP: (96-156)/(49-92) 146/59 mmHg (10/25 0700) FiO2 (%):  [30 %-60 %] 30 % (10/25 0355) Weight:  [194 lb 0.1 oz (88 kg)-196 lb 6.9 oz (89.1 kg)] 196 lb 6.9 oz (89.1 kg) (10/25 0600)  Hemodynamic parameters for last 24 hours:    Intake/Output from previous day: 10/24 0701 - 10/25 0700 In: 3500 [I.V.:3250; IV Piggyback:250] Out: 1382 [Urine:990; Blood:100; Chest Tube:292] Intake/Output this shift:    General appearance: alert and no distress Neurologic: intact Heart: regular rate and rhythm Lungs: diminished breath sounds bibasilar Abdomen: normal findings: soft, non-tender  Lab Results:  Basename 11/03/11 0410 10/31/11 1051  WBC 17.2* 11.0*  HGB 13.4 14.7  HCT 39.6 42.9  PLT 163 170   BMET:  Basename 11/03/11 0410 10/31/11 1051  NA 136 137  K 4.9 4.0  CL 103 104  CO2 24 22  GLUCOSE 133* 100*  BUN 16 17  CREATININE 0.78 0.83  CALCIUM 8.7 9.5    PT/INR:  Basename 10/31/11 1051  LABPROT 15.1  INR 1.21   ABG    Component Value Date/Time   PHART 7.359 11/03/2011 0415   HCO3 28.6* 11/03/2011 0415   TCO2 30 11/03/2011 0415   ACIDBASEDEF 1.6 06/13/2011 1925   O2SAT 92.0 11/03/2011 0415   CBG (last 3)  No results found for this basename: GLUCAP:3 in the last 72 hours  Assessment/Plan: S/P Procedure(s) (LRB): VIDEO ASSISTED THORACOSCOPY (Right) PLEURAL BIOPSY (Right) INSERTION PLEURAL DRAINAGE  CATHETER (Right) DECORTICATION (Right) DRAINAGE OF PLEURAL EFFUSION (Right) POD # 1 D/c chest tube  Start pleur-X catheter drainage QOD beginning tomorrow  Hypercarbic acute on chronic respiratory failure- improved, now off BIPAP, encourage pulmonary hygiene, uses BIPAP PRN  Recent DVT - restart Xarelto  Mobilize  Keep in ICU today for pulmonary treatment and mobilization     LOS: 1 day    Patsey Pitstick C 11/03/2011

## 2011-11-03 NOTE — Progress Notes (Signed)
Patient ID: Steve [name], male   DOB: 07/16/1933, 78 y.o.   MRN: [id number]   SICU afternoon rounds:  Hemodynamically stable. Up to chair and ambulated today. CT is out. Sats 93%  Urine output 30/hr.  A/P: stable. Continue present course, pulmonary toilet.

## 2011-11-03 NOTE — Op Note (Signed)
NAMELONDON, [name] NO.:  [id number]  MEDICAL RECORD NO.:  [id number]  LOCATION:  2314                         FACILITY:  [facility]  PHYSICIAN:  Salvatore Decent. Dorris Fetch, M.D.DATE OF BIRTH:  28-Dec-1933  DATE OF PROCEDURE:  11/02/2011 DATE OF DISCHARGE:                              OPERATIVE REPORT   PREOPERATIVE DIAGNOSIS:  Right pleural effusion with atypical cells on cytology.  POSTOPERATIVE DIAGNOSIS:  Right pleural effusion with atypical cells on cytology.  PROCEDURE:  Right video-assisted thoracoscopy, drainage of right pleural effusion, decortication of right lower lobe, biopsy of pleura, PleurX catheter placement.  SURGEON:  Salvatore Decent. Dorris Fetch, MD  ASSISTANT:  Doree Fudge, PA  ANESTHESIA:  General.  FINDINGS:  Approximately 750 mL of amber fluid, right lower lobe. Incomplete re-expansion due to adhesions required decortication.  Faint nodularity of the parietal pleura inferiorly.  Good re-expansion of the lung post decortication.  CLINICAL NOTE:  [name] is a 78 year old gentleman with a history of both tobacco abuse and exposure to asbestos.  He recently was found to have a right pleural effusion.  A thoracentesis was performed which showed highly atypical cells suspicious for malignancy, but no definitive diagnosis could be established.  He is now sent for video- assisted thoracoscopy to diagnose the underlying cause of the effusion and to manage as well.  I discussed the planned operative procedure which was right VATS drainage of the effusion and possible decortication, possible talc pleurodesis, possible PleurX catheter placement in detail with [name].  He understood and accepted the risks and agreed to proceed.  OPERATIVE NOTE:  [name] was brought to the preoperative holding area on November 02, 2011. The Anesthesia service established intravenous access and placed an arterial blood pressure monitoring line.  He was taken  to the operating room.  PAS hose were placed for DVT prophylaxis.  He was anesthetized and intubated with a double-lumen endotracheal tube.  A Foley catheter was placed.  He was placed in a left lateral decubitus position and the right chest was prepped and draped in usual sterile fashion.  Single lung ventilation of the left lung was carried out and was tolerated well.  An incision was made at approximately the eighth intercostal space in the mid axillary line was carried through the skin and subcutaneous tissue.  Hemostasis was achieved with electrocautery.  The chest was entered bluntly using a hemostat.  Fluid was evacuated.  About 750 mL of amber fluid was evacuated and a port was inserted and a thoracoscope was placed in the chest.  There was good deflation of the right lung.  There was minimal residual fluid left within the chest.  A second port type incision was made more anteriorly for instrumentation.  Inspection of the visceral and parietal pleura revealed 2 small nodules on the visceral pleura.  These were taken and sent as a separate specimen. These were not particularly adherent and were not coming from the lung itself but were loosely adherent to the visceral pleura.  Inspection of the parietal pleura revealed some inflammatory changes and there was faint nodularity inferiorly near the diaphragm.  This area was biopsied and a portion of the parietal pleura  taken and sent for permanent sections.  Attention then was turned to the lung.  Inflation of the right lung should good inflation of the upper and middle lobes but the lower lobe was entrapped with adhesions.  This was not a full peel but there was essentially a spider web of adhesions that were relatively thicker and thinner in different portions.  Attempts to create a plane and strip the adhesions were only partially successful, and there were significant residual areas of the lung that were entrapped.  A combination  of sharp dissection, electrocautery and blunt dissection were used to divide and remove as much of the adhesions as possible until ultimately there was a good re-expansion of the right lower lobe.  Next, a PleurX catheter was placed.  This was done through the original port incision.  A separate incision was made through the exit site.  The peel-away sheath introducer was passed through the intercostal musculature just anterior to the original chest entry site.  The PleurX catheter was advanced from the exit site to the entry site with a cut made just below the skin at the exit site.  It was then passed through the peel-away sheath introducer so went to the intercostal muscles just anterior to where the original port had been placed.  Once the catheter was in the chest, the peel-away sheath introducer was removed. Thoracoscope then was used to visualize the positioning of the PleurX catheter along the diaphragm laterally and then posteriorly along the paravertebral gutter.  A separate port incision was made and a 28-French Blake drain was placed in a similar fashion.  The right lung was reinflated.  There was good re-expansion of all 3 lobes.  The scope was withdrawn.  The remaining 2 port incisions were closed with #1 Vicryl sutures followed by 3-0 Vicryl subcuticular sutures.  All sponge, needle, and instrument counts were correct at the end of the procedure. The patient was taken from the operating room to the postanesthetic care unit in good condition.     Salvatore Decent Dorris Fetch, M.D.     SCH/MEDQ  D:  11/02/2011  T:  11/03/2011  Job:  [id number]

## 2011-11-03 NOTE — Plan of Care (Signed)
Problem: Phase I Progression Outcomes Goal: O2 sats > or equal to 88% with O2 Outcome: Progressing On Bipap.Marland Kitchen..Marland Kitchenweaning off Goal: Pain controlled with appropriate interventions Outcome: Progressing PCA

## 2011-11-04 ENCOUNTER — Encounter (HOSPITAL_COMMUNITY): Payer: Self-pay | Admitting: Cardiology

## 2011-11-04 ENCOUNTER — Inpatient Hospital Stay (HOSPITAL_COMMUNITY): Payer: Medicare Other

## 2011-11-04 LAB — CBC
HCT: 39 % (ref 39.0–52.0)
Hemoglobin: 13.2 g/dL (ref 13.0–17.0)
MCV: 95.4 fL (ref 78.0–100.0)
Platelets: 130 10*3/uL — ABNORMAL LOW (ref 150–400)
RBC: 4.09 MIL/uL — ABNORMAL LOW (ref 4.22–5.81)
WBC: 14.9 10*3/uL — ABNORMAL HIGH (ref 4.0–10.5)

## 2011-11-04 LAB — COMPREHENSIVE METABOLIC PANEL
AST: 11 U/L (ref 0–37)
BUN: 14 mg/dL (ref 6–23)
CO2: 26 mEq/L (ref 19–32)
Chloride: 104 mEq/L (ref 96–112)
Creatinine, Ser: 0.8 mg/dL (ref 0.50–1.35)
GFR calc non Af Amer: 85 mL/min — ABNORMAL LOW (ref >90)
Glucose, Bld: 92 mg/dL (ref 70–99)
Total Bilirubin: 0.5 mg/dL (ref 0.3–1.2)

## 2011-11-04 MED ORDER — ENSURE COMPLETE PO LIQD
237.0000 mL | Freq: Two times a day (BID) | ORAL | Status: DC
  Administered 2011-11-04 – 2011-11-06 (×4): 237 mL via ORAL

## 2011-11-04 MED ORDER — BISACODYL 10 MG RE SUPP
10.0000 mg | Freq: Once | RECTAL | Status: AC
  Administered 2011-11-04: 10 mg via RECTAL
  Filled 2011-11-04: qty 1

## 2011-11-04 NOTE — Progress Notes (Signed)
DC'ed PCA...syringe empty

## 2011-11-04 NOTE — Progress Notes (Signed)
2 Days Post-Op Procedure(s) (LRB): VIDEO ASSISTED THORACOSCOPY (Right) PLEURAL BIOPSY (Right) INSERTION PLEURAL DRAINAGE CATHETER (Right) DECORTICATION (Right) DRAINAGE OF PLEURAL EFFUSION (Right) Subjective: No complaints  Objective: Vital signs in last 24 hours: Temp:  [97.3 F (36.3 C)-98.9 F (37.2 C)] 97.3 F (36.3 C) (10/26 0804) Pulse Rate:  [36-72] 72  (10/26 0900) Cardiac Rhythm:  [-] Sinus bradycardia;Heart block (10/26 0740) Resp:  [13-27] 17  (10/26 1100) BP: (95-168)/(42-79) 148/63 mmHg (10/26 1100) SpO2:  [91 %-100 %] 98 % (10/26 1100) Weight:  [88.6 kg (195 lb 5.2 oz)] 88.6 kg (195 lb 5.2 oz) (10/26 0500)  Hemodynamic parameters for last 24 hours:    Intake/Output from previous day: 10/25 0701 - 10/26 0700 In: 1570 [P.O.:260; I.V.:1160; IV Piggyback:150] Out: 1410 [Urine:1360; Chest Tube:50] Intake/Output this shift: Total I/O In: 387 [P.O.:180; I.V.:207] Out: 400 [Urine:50; Chest Tube:350]  General appearance: alert and cooperative Heart: regular rate and rhythm, S1, S2 normal, no murmur, click, rub or gallop Lungs: diminished breath sounds base - right  Lab Results:  Basename 11/04/11 0335 11/03/11 0410  WBC 14.9* 17.2*  HGB 13.2 13.4  HCT 39.0 39.6  PLT 130* 163   BMET:  Basename 11/04/11 0335 11/03/11 0410  NA 137 136  K 3.9 4.9  CL 104 103  CO2 26 24  GLUCOSE 92 133*  BUN 14 16  CREATININE 0.80 0.78  CALCIUM 8.7 8.7    PT/INR: No results found for this basename: LABPROT,INR in the last 72 hours ABG    Component Value Date/Time   PHART 7.359 11/03/2011 0415   HCO3 28.6* 11/03/2011 0415   TCO2 30 11/03/2011 0415   ACIDBASEDEF 1.6 06/13/2011 1925   O2SAT 92.0 11/03/2011 0415   CBG (last 3)  No results found for this basename: GLUCAP:3 in the last 72 hours  Assessment/Plan: S/P Procedure(s) (LRB): VIDEO ASSISTED THORACOSCOPY (Right) PLEURAL BIOPSY (Right) INSERTION PLEURAL DRAINAGE CATHETER (Right) DECORTICATION  (Right) DRAINAGE OF PLEURAL EFFUSION (Right) Mobilize Plan for transfer to step-down: see transfer orders Daily drainage of pleurx Possibly home tomorrow.   LOS: 2 days    Evelene Croon K 11/04/2011

## 2011-11-04 NOTE — Progress Notes (Signed)
INITIAL ADULT NUTRITION ASSESSMENT Date: 11/04/2011   Time: 2:26 PM Reason for Assessment: Nutrition risk   INTERVENTION: Strawberry Ensure Complete BID. Encouraged increased meal intake and for family to bring in foods pt prefers. Recommend diet liberalization as pt on diabetic diet, however no history of diabetes documented and regular diet would provide pt with additional calories. If poor appetite persists and pt continues to lose weight, recommend MD consider appetite stimulant. RD to monitor nutrition plan of care.  ASSESSMENT: Male 78 y.o.  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 Pt thought to have malignant pleural effusion and had pleural drain placed on 10/24. Pt reports appetite used to be poor but has been picking up recently. Pt reports eating 3 meals/day, 1 at home, 2 from eating out. Pt reports weighing 220 pounds a year ago, now weighs 195 pounds. Pt's weight used to be lower, down to 182 pounds last month, but he has been gaining weight gradually. Pt's wife bought some Ensure at home but pt hadn't tried any yet. Pt reports eating 25% of lunch today.   Hx:  Past Medical History  Diagnosis Date  . Prostate enlargement   . History of chicken pox     childhood  . GERD (gastroesophageal reflux disease)   . Arthritis     knee and back  . AAA (abdominal aortic aneurysm)     stress test 09/14/10 [facility]  . Anxiety   . Dysrhythmia     hx of atrial fib post op x 2 days   . COPD (chronic obstructive pulmonary disease)   . Shortness of breath     with exertion   . Recurrent upper respiratory infection (URI)     productive cough- white phlegm- no fever  . Sleep apnea      12/12 sleep study,SEVERE per study  Dr Delton Coombes- states doesnt wear machine on  regular basis- setting BiPAP 9/9  . Hypertension     chest x ray 12/12 [facility]- repeated 06/06/11, EKG 11/12 [facility]  . DVT (deep venous thrombosis) 08/2011  . Pneumonia   . Bruises easily     takes Xarelto   Related Meds:  Scheduled Meds:   . bisacodyl  10 mg Rectal Once  . metoprolol tartrate  12.5 mg Oral BID  . multivitamin with minerals  1 tablet Oral Daily  . nicotine  14 mg Transdermal Q0600  . Rivaroxaban  20 mg Oral q morning - 10a  . senna  2 tablet Oral BID  . sertraline  50 mg Oral q morning - 10a  . tiotropium  18 mcg Inhalation Daily  . DISCONTD: fentaNYL   Intravenous Q4H  . DISCONTD: nicotine  14 mg Transdermal Q0600  . DISCONTD: nicotine  7 mg Transdermal Q0600   Continuous Infusions:   . DISCONTD: dextrose 5 % and 0.9 % NaCl with KCl 20 mEq/L 50 mL/hr at 11/03/11 0932   PRN Meds:.albuterol, ALPRAZolam, ondansetron (ZOFRAN) IV, oxyCODONE, oxyCODONE-acetaminophen, traMADol, DISCONTD: diphenhydrAMINE, DISCONTD: diphenhydrAMINE, DISCONTD: naloxone, DISCONTD: ondansetron (ZOFRAN) IV, DISCONTD: potassium chloride, DISCONTD: sodium chloride  Ht: 5\' 8"  (172.7 cm)  Wt: 195 lb 5.2 oz (88.6 kg)  Ideal Wt: 154 lb % Ideal Wt: 127  Usual Wt: 220 lb  % Usual Wt: 89  Wt Readings from Last 10 Encounters:  11/04/11 195 lb 5.2  oz (88.6 kg)  11/04/11 195 lb 5.2 oz (88.6 kg)  10/31/11 194 lb 3.2 oz (88.089 kg)  10/24/11 182 lb (82.555 kg)  10/20/11 191 lb (86.637 kg)  10/03/11 182 lb (82.555 kg)  09/21/11 188 lb (85.276 kg)  09/14/11 186 lb (84.369 kg)  09/01/11 187 lb (84.823 kg)  08/04/11 188 lb 12 oz (85.616 kg)    Body mass index is 29.70 kg/(m^2).  Labs:  CMP     Component Value Date/Time   NA 137 11/04/2011 0335   K 3.9 11/04/2011 0335   CL 104 11/04/2011 0335   CO2 26 11/04/2011 0335   GLUCOSE 92 11/04/2011 0335   BUN 14 11/04/2011 0335   CREATININE 0.80 11/04/2011 0335   CREATININE 1.16 09/16/2010 1514   CALCIUM 8.7 11/04/2011 0335   PROT 6.2 11/04/2011  0335   ALBUMIN 2.6* 11/04/2011 0335   AST 11 11/04/2011 0335   ALT 11 11/04/2011 0335   ALKPHOS 86 11/04/2011 0335   BILITOT 0.5 11/04/2011 0335   GFRNONAA 85* 11/04/2011 0335   GFRAA >90 11/04/2011 0335   Lab Results  Component Value Date   HGBA1C 6.0 08/01/2011   CBG (last 3)  No results found for this basename: GLUCAP:3 in the last 72 hours   Intake/Output Summary (Last 24 hours) at 11/04/11 1433 Last data filed at 11/04/11 1300  Gross per 24 hour  Intake   1737 ml  Output   1755 ml  Net    -18 ml   Last BM - 10/26   Diet Order: Carb Control   IVF:    DISCONTD: dextrose 5 % and 0.9 % NaCl with KCl 20 mEq/L Last Rate: 50 mL/hr at 11/03/11 0932    Estimated Nutritional Needs:   Kcal:1750-2200 Protein:90-105g Fluid:1.7-2.2L  NUTRITION DIAGNOSIS: -Inadequate oral intake (NI-2.1).  Status: Ongoing  RELATED TO: pt not liking the food, poor appetite  AS EVIDENCE BY: wife's statement, 25% meal intake  MONITORING/EVALUATION(Goals): Pt to consume >90% of meals/supplements.   EDUCATION NEEDS: -Education needs addressed - briefly discussed ways to improve appetite including consuming small frequent high calorie/protein meals/snacks/beverages. Provided [facility] RD contact information as she follows patients of Dr. Gustavo Lah.     Dietitian #: [phone]  DOCUMENTATION CODES Per approved criteria  -Not Applicable    Marshall Cork 11/04/2011, 2:26 PM

## 2011-11-04 NOTE — Progress Notes (Signed)
1300 has bed on 2000, will need to eat and have bath before moving

## 2011-11-04 NOTE — Progress Notes (Signed)
Report to 2000 RN 

## 2011-11-04 NOTE — Progress Notes (Signed)
1350 transferred to 2035 via WC, O2 and monitor, placed in bed, SCD to L leg, RN to receive in room

## 2011-11-04 NOTE — Progress Notes (Signed)
pleurX catheter drained per protocal, 350cc SS drainage, dressing change done, wife observed(will be helping at home) pt c/o right shoulder and mid back pain relieved with PCA fentanyl doses

## 2011-11-05 MED ORDER — OXYCODONE-ACETAMINOPHEN 5-325 MG PO TABS: 1.0000 | ORAL_TABLET | Freq: Four times a day (QID) | ORAL | Status: DC | PRN

## 2011-11-05 MED ORDER — INFLUENZA VIRUS VACC SPLIT PF IM SUSP
0.5000 mL | Freq: Once | INTRAMUSCULAR | Status: AC
  Administered 2011-11-06: 0.5 mL via INTRAMUSCULAR
  Filled 2011-11-05 (×2): qty 0.5

## 2011-11-05 MED ORDER — NICOTINE 14 MG/24HR TD PT24: 1.0000 | MEDICATED_PATCH | Freq: Every day | TRANSDERMAL | Status: DC

## 2011-11-05 NOTE — Progress Notes (Signed)
R IJ d/c per MD order. Pressure applied. Dressing applied. Site WNL. Pt tolerated well. Bed rest for 30 minutes.

## 2011-11-05 NOTE — Progress Notes (Signed)
Pleurx catheter drained per MD order. 250 cc of tea colored output drained. Site WNL. Pt tolerated well. Will continue to monitor.

## 2011-11-05 NOTE — Progress Notes (Addendum)
[address]            [place] [address]          [phone]     3 Days Post-Op  Procedure(s) (LRB): VIDEO ASSISTED THORACOSCOPY (Right) PLEURAL BIOPSY (Right) INSERTION PLEURAL DRAINAGE CATHETER (Right) DECORTICATION (Right) DRAINAGE OF PLEURAL EFFUSION (Right) Subjective Appears comfortable   Objective  Telemetry sinus rhythm   Temp:  [97.4 F (36.3 C)-98.1 F (36.7 C)] 98 F (36.7 C) (10/27 0514) Pulse Rate:  [57-66] 57  (10/27 0514) Resp:  [18-19] 18  (10/27 0514) BP: (129-159)/(53-67) 147/56 mmHg (10/27 0514) SpO2:  [94 %-96 %] 96 % (10/27 0514)   Intake/Output Summary (Last 24 hours) at 11/05/11 1114 Last data filed at 11/05/11 1014  Gross per 24 hour  Intake    720 ml  Output    600 ml  Net    120 ml       General appearance: alert, cooperative and no distress Heart: regular rate and rhythm Lungs: dim in bases Wound: minserous drainage  Lab Results:  [facility] 11/04/11 0335 11/03/11 0410  NA 137 136  K 3.9 4.9  CL 104 103  CO2 26 24  GLUCOSE 92 133*  BUN 14 16  CREATININE 0.80 0.78  CALCIUM 8.7 8.7  MG -- --  PHOS -- --    Basename 11/04/11 0335  AST 11  ALT 11  ALKPHOS 86  BILITOT 0.5  PROT 6.2  ALBUMIN 2.6*   No results found for this basename: LIPASE:2,AMYLASE:2 in the last 72 hours  Basename 11/04/11 0335 11/03/11 0410  WBC 14.9* 17.2*  NEUTROABS -- --  HGB 13.2 13.4  HCT 39.0 39.6  MCV 95.4 95.7  PLT 130* 163   No results found for this basename: CKTOTAL:4,CKMB:4,TROPONINI:4 in the last 72 hours No components found with this basename: POCBNP:3 No results found for this basename: DDIMER in the last 72 hours No results found for this basename: HGBA1C in the last 72 hours No results found for this basename: CHOL,HDL,LDLCALC,TRIG,CHOLHDL in the last 72 hours No results found for this basename: TSH,T4TOTAL,FREET3,T3FREE,THYROIDAB in the last 72 hours No results found for this basename:  VITAMINB12,FOLATE,FERRITIN,TIBC,IRON,RETICCTPCT in the last 72 hours  Medications: Scheduled    . bisacodyl  10 mg Rectal Once  . feeding supplement  237 mL Oral BID BM  . influenza  inactive virus vaccine  0.5 mL Intramuscular Once  . metoprolol tartrate  12.5 mg Oral BID  . multivitamin with minerals  1 tablet Oral Daily  . nicotine  14 mg Transdermal Q0600  . Rivaroxaban  20 mg Oral q morning - 10a  . senna  2 tablet Oral BID  . sertraline  50 mg Oral q morning - 10a  . tiotropium  18 mcg Inhalation Daily  . DISCONTD: fentaNYL   Intravenous Q4H     Radiology/Studies:  Dg Chest Port 1 View  11/04/2011  *RADIOLOGY REPORT*  Clinical Data: Right-sided VATS.  PORTABLE CHEST - 1 VIEW  Comparison: One-view chest 11/03/2011.  Findings: The more lateral right-sided chest tube has been removed. The medial tube persists.  The right IJ line remains.  The right pleural effusion has increased.  Bibasilar airspace disease is increased as well.  Minimal left basilar atelectasis is evident. The heart size is normal.  IMPRESSION:  1.  Increasing right pleural effusion and edema. 2.  A single  right-sided chest tube remains without evidence for pneumothorax. 3.  Stable right IJ line. 4.  Stable left basilar atelectasis.   Original Report Authenticated By: Jamesetta Orleans. MATTERN, M.D.     INR: Will add last result for INR, ABG once components are confirmed Will add last 4 CBG results once components are confirmed  Assessment/Plan: S/P Procedure(s) (LRB): VIDEO ASSISTED THORACOSCOPY (Right) PLEURAL BIOPSY (Right) INSERTION PLEURAL DRAINAGE CATHETER (Right) DECORTICATION (Right) DRAINAGE OF PLEURAL EFFUSION (Right)  1. Doing well , will ask care management to arrange for pleurx system bottles at home, [facility] 2  drain pleurx today     LOS: 3 days    GOLD,WAYNE E 10/27/201311:14 AM     Chart reviewed, patient examined, agree with above. Pleurx put out 350 cc yesterday and 250 cc today. He  should be able to go home tomorrow once drainage arranged with HH.

## 2011-11-06 ENCOUNTER — Encounter (HOSPITAL_COMMUNITY): Payer: Self-pay | Admitting: Thoracic Surgery (Cardiothoracic Vascular Surgery)

## 2011-11-06 MED ORDER — LACTULOSE 10 GM/15ML PO SOLN
20.0000 g | Freq: Once | ORAL | Status: AC
  Administered 2011-11-06: 20 g via ORAL
  Filled 2011-11-06: qty 30

## 2011-11-06 MED ORDER — RIVAROXABAN 20 MG PO TABS
20.0000 mg | ORAL_TABLET | Freq: Every day | ORAL | Status: DC
  Administered 2011-11-06: 20 mg via ORAL
  Filled 2011-11-06: qty 1

## 2011-11-06 NOTE — Progress Notes (Signed)
Pt d/c home with instructions, r/x, and f/u appointments, pt and wife verbalized understanding. Pt sent with drainage bottles with HH f/u.  Pt home with wife escorted out by [employer]

## 2011-11-06 NOTE — Progress Notes (Addendum)
                   [address]            [place] [address]          [phone]    4 Days Post-Op Procedure(s) (LRB): VIDEO ASSISTED THORACOSCOPY (Right) PLEURAL BIOPSY (Right) INSERTION PLEURAL DRAINAGE CATHETER (Right) DECORTICATION (Right) DRAINAGE OF PLEURAL EFFUSION (Right)  Subjective: Patient wants to go home. Has not had a bowel movement since surgery.  Objective: Vital signs in last 24 hours: Temp:  [97.4 F (36.3 C)-97.8 F (36.6 C)] 97.7 F (36.5 C) (10/28 0451) Pulse Rate:  [52-62] 52  (10/28 0451) Cardiac Rhythm:  [-] Heart block (10/27 1930) Resp:  [18] 18  (10/28 0451) BP: (127-160)/(65-66) 160/66 mmHg (10/28 0451) SpO2:  [93 %-97 %] 93 % (10/28 0451)     Intake/Output from previous day: 10/27 0701 - 10/28 0700 In: 480 [P.O.:480] Out: 300 [Urine:300]   Physical Exam:  Cardiovascular: Slightly bradycardic Pulmonary: Clear to auscultation bilaterally; no rales, wheezes, or rhonchi. Abdomen: Soft, non tender, bowel sounds present. Extremities: No lower extremity edema Wounds: Clean and dry.  No erythema or signs of infection.   Lab Results: CBC: Basename 11/04/11 0335  WBC 14.9*  HGB 13.2  HCT 39.0  PLT 130*   BMET:  Basename 11/04/11 0335  NA 137  K 3.9  CL 104  CO2 26  GLUCOSE 92  BUN 14  CREATININE 0.80  CALCIUM 8.7    PT/INR: No results found for this basename: LABPROT,INR in the last 72 hours ABG:  INR: Will add last result for INR, ABG once components are confirmed Will add last 4 CBG results once components are confirmed  Assessment/Plan:  1. CV - SB, 1st degree AV block.on Lopressor 12.5 bid. Will hold this am as HR in the 50's 2.  Pulmonary - Right pleurex catheter drained yesterday. 250 cc removed. 3.LOC constipation 4.Likely discharge today   ZIMMERMAN,DONIELLE MPA-C 11/06/2011,7:30 AM   Had BM earlier today  Feels better wants to go home  Will d/c today with QOD pleur-x drainage

## 2011-11-06 NOTE — Care Management Note (Signed)
    Page 1 of 2   11/06/2011     3:47:01 PM   CARE MANAGEMENT NOTE 11/06/2011  Patient:  Steve [name], Steve [name]   Account Number:  [id number]  Date Initiated:  11/03/2011  Documentation initiated by:  [facility]  Subjective/Objective Assessment:   VIDEO ASSISTED THORACOSCOPY (Right)  PLEURAL BIOPSY (Right)  INSERTION PLEURAL DRAINAGE CATHETER (Right)  DECORTICATION (Right)  DRAINAGE OF PLEURAL EFFUSION (Right)     Action/Plan:   Anticipated DC Date:  11/09/2011   Anticipated DC Plan:  HOME W HOME HEALTH SERVICES      DC Planning Services  CM consult      [facility] Choice  HOME HEALTH   Choice offered to / List presented to:  C-1 Patient        HH arranged  HH-1 RN      [facility] agency  [facility]   Status of service:  Completed, signed off Medicare Important Message given?   (If response is "NO", the following Medicare IM given date fields will be blank) Date Medicare IM given:   Date Additional Medicare IM given:    Discharge Disposition:  HOME W HOME HEALTH SERVICES  Per UR Regulation:  Reviewed for med. necessity/level of care/duration of stay  If discussed at Long Length of Stay Meetings, dates discussed:    Comments:  Contact;  Trumon, Berding Spouse [phone]                  Jones,Linda Daughter [phone]   11/06/11 Sidney Ace, RN, BSN  [phone] PT FOR DC HOME TODAY WITH PLEURX CATHETER.  HOME HEALTH RN ARRANGED WITH [facility], PER PT CHOICE, TO DRAIN PLEURX MWF.  PT DC'D WITH FULL CASE OF PLEURX KITS.  WILL COMPLETE PLEURX DRAINAGE KIT PAPERWORK AND FAX TO [facility], PER PROTOCOL.

## 2011-11-06 NOTE — Discharge Summary (Signed)
Physician Discharge Summary  Patient ID: Steve [name] MRN: [id number] DOB/AGE: 76-Nov-1935 76 y.o.  Admit date: 11/02/2011 Discharge date: 11/06/2011  Admission Diagnoses: 1.Right pleural effusion 2.History of AAA 3.History of COPD 4.History of OSA 5.History of tobacco abuse 6.History of GERD 7.History of hypertension 8.History of DVT 9.History of prostate enlargement  Discharge Diagnoses:  1.Right pleural effusion 2.History of AAA 3.History of COPD 4.History of OSA 5.History of tobacco abuse 6.History of GERD 7.History of hypertension 8.History of DVT 9.History of prostate enlargement  Procedure (s):  Right video-assisted thoracoscopy, drainage of right pleural  effusion, decortication of right lower lobe, biopsy of pleura, PleurX catheter placement by Dr. Dorris Fetch on 11/02/2011.  History of Presenting Illness: This is a 78 year old gentleman with multiple medical problems and long-term history of tobacco abuse. He also has a remote history of exposure to asbestos. He recently has been having problems with frequent falls. During this workup,  he was found to have a DVT, for which he is being treated with Xarelto. He also has been found to have a right pleural effusion. He had a thoracentesis for this on 09/25/11. Cytology showed highly atypical cells, suspicious for malignancy. Cells were positive for diabetes-1, calretinin and cytokeratin 5/6. They were negative for Napsin A, TTF-1 and MOC-31. He also recently has had a bone marrow biopsy which showed a slightly hypercellular bone marrow for age with plasma cell dyscrasia. Dr. Myna Hidalgo  referred him for consideration of a pleural biopsy. He was seen in the office by Dr. Dorris Fetch on 10/24/2011. Potential risks, benefits,and complications were discussed with the patient and he agreed to proceed. He was admitted to [facility] on 11/02/2011 for a right VATS, drainage of right pleural effusion, decortication of the RLL, biopsy of  the pleura, and right pleurex catheter.  Brief Hospital Course:  He has remained afebrile.His PCA and foley were removed early in his post op course.His right chest tube was removed on post op day 2.He was transferred from the ICU to 2000 for further convalescence on 11/04/2011. His right pleurex catheter was drained on 11/05/2011. 250 cc was removed. His central line was removed on this date as well. His heart rate was in the low 50's and he had a primary AV block. His Lopressor was held this morning. I instructed the patient to not take this upon discharge. He is to call his medical doctor later today or in the am to discuss whether or not he will need another agent to help control his blood pressure.He has been tolerating a diet.He does require 2 L O2 via [place] at night (he was on this prior to surgery).He is felt surgically stable for discharge today with home health. Home health is to drain his right pleurex catheter every Monday, Wednesday, and Friday.    Latest Vital Signs: Blood pressure 176/70, pulse 56, temperature 98.8 F (37.1 C), temperature source Oral, resp. rate 20, height 5\' 8"  (1.727 m), weight 195 lb 5.2 oz (88.6 kg), SpO2 92.00%.  Physical Exam: Cardiovascular: Slightly bradycardic  Pulmonary: Clear to auscultation bilaterally; no rales, wheezes, or rhonchi.  Abdomen: Soft, non tender, bowel sounds present.  Extremities: No lower extremity edema  Wounds: Clean and dry. No erythema or signs of infection.   Discharge Condition:Stable  Recent laboratory studies:  Lab Results  Component Value Date   WBC 14.9* 11/04/2011   HGB 13.2 11/04/2011   HCT 39.0 11/04/2011   MCV 95.4 11/04/2011   PLT 130* 11/04/2011   Lab Results  Component  Value Date   NA 137 Nov 18, 2011   K 3.9 November 18, 2011   CL 104 2011-11-18   CO2 26 11-18-11   CREATININE 0.80 Nov 18, 2011   GLUCOSE 92 11/18/11      Diagnostic Studies:   Dg Chest Port 1 View  2011/11/18  *RADIOLOGY REPORT*  Clinical  Data: Right-sided VATS.  PORTABLE CHEST - 1 VIEW  Comparison: One-view chest 11/03/2011.  Findings: The more lateral right-sided chest tube has been removed. The medial tube persists.  The right IJ line remains.  The right pleural effusion has increased.  Bibasilar airspace disease is increased as well.  Minimal left basilar atelectasis is evident. The heart size is normal.  IMPRESSION:  1.  Increasing right pleural effusion and edema. 2.  A single right-sided chest tube remains without evidence for pneumothorax. 3.  Stable right IJ line. 4.  Stable left basilar atelectasis.   Original Report Authenticated By: Jamesetta Orleans. MATTERN, M.D.     Discharge Orders    Future Appointments: Provider: Department: Dept Phone: Center:   11/21/2011 10:30 AM Loreli Slot, MD [facility] [phone] [facility]      Discharge Medications:   Medication List     As of 11/06/2011  2:21 PM    STOP taking these medications         metoprolol tartrate 12.5 mg Tabs   Commonly known as: LOPRESSOR      TAKE these medications         albuterol 108 (90 BASE) MCG/ACT inhaler   Commonly known as: PROVENTIL HFA;VENTOLIN HFA   Inhale 2 puffs into the lungs every 6 (six) hours as needed for wheezing.      ALPRAZolam 0.25 MG tablet   Commonly known as: XANAX   Take 1 tablet (0.25 mg total) by mouth 3 (three) times daily as needed for anxiety. For sleep      multivitamin with minerals Tabs   Take 1 tablet by mouth daily.      nicotine 14 mg/24hr patch   Commonly known as: NICODERM CQ - dosed in mg/24 hours   Place 1 patch onto the skin daily at 6 (six) AM.      oxyCODONE-acetaminophen 5-325 MG per tablet   Commonly known as: PERCOCET/ROXICET   Take 1-2 tablets by mouth every 6 (six) hours as needed.      Rivaroxaban 20 MG Tabs   Commonly known as: XARELTO   Take 1 tablet (20 mg total) by mouth every morning.      senna 8.6 MG tablet   Commonly known as: SENOKOT   Take 2 tablets by mouth 2  (two) times daily.      sertraline 50 MG tablet   Commonly known as: ZOLOFT   Take 50 mg by mouth every morning.      tiotropium 18 MCG inhalation capsule   Commonly known as: SPIRIVA   Place 18 mcg into inhaler and inhale daily.        Follow Up Appointments:     Follow-up Information    Follow up with HENDRICKSON,[name], MD. (2 weeks-office will contact you his appointment time and date. Additionally obtain a chest x-ray one hour prior to seeing the surgeon at [facility] imaging. [facility] is located in the same office complex to)    Contact information:   [address] [place] [address] [phone]       Follow up with Letitia Libra, Ala Dach, MD. (Regarding hypertension management and whether or not  to resume Lopressor)    Contact information:   [address] [place] [address] [phone]       Follow up with Home Health. (Please drain right pleurex every Monday, Wednseday, and Friday and record the output)          Signed: Jatavious Peppard MPA-C 11/06/2011, 2:21 PM

## 2011-11-07 ENCOUNTER — Other Ambulatory Visit: Payer: Self-pay | Admitting: Emergency Medicine

## 2011-11-08 ENCOUNTER — Telehealth: Payer: Self-pay | Admitting: *Deleted

## 2011-11-08 NOTE — Telephone Encounter (Signed)
Pt's wife [Ann] called RE: D/C of Metoprolol occuring with hospital D/C 10.28.13 & wanted to know if patient should remain off of Rx until being seen for f/u 11.08.13; per Vo [facility], informed that this should be Ok as long as BP/HR is monitored daily at different times of day and pt shows no symptoms of hypertension [discussed], understood & agreed/SLS

## 2011-11-09 ENCOUNTER — Other Ambulatory Visit: Payer: Self-pay | Admitting: Hematology & Oncology

## 2011-11-09 ENCOUNTER — Encounter: Payer: Self-pay | Admitting: Internal Medicine

## 2011-11-09 ENCOUNTER — Ambulatory Visit (INDEPENDENT_AMBULATORY_CARE_PROVIDER_SITE_OTHER): Payer: Medicare Other | Admitting: Internal Medicine

## 2011-11-09 ENCOUNTER — Telehealth: Payer: Self-pay | Admitting: Hematology & Oncology

## 2011-11-09 VITALS — BP 138/72 | HR 64 | Temp 97.9°F | Resp 18 | Wt 193.0 lb

## 2011-11-09 DIAGNOSIS — R001 Bradycardia, unspecified: Secondary | ICD-10-CM | POA: Insufficient documentation

## 2011-11-09 DIAGNOSIS — C450 Mesothelioma of pleura: Secondary | ICD-10-CM | POA: Insufficient documentation

## 2011-11-09 DIAGNOSIS — D472 Monoclonal gammopathy: Secondary | ICD-10-CM

## 2011-11-09 DIAGNOSIS — I498 Other specified cardiac arrhythmias: Secondary | ICD-10-CM

## 2011-11-09 DIAGNOSIS — C384 Malignant neoplasm of pleura: Secondary | ICD-10-CM

## 2011-11-09 NOTE — Assessment & Plan Note (Signed)
Resume metoprolol. Monitor hr and bp

## 2011-11-09 NOTE — Telephone Encounter (Signed)
Pt aware of 11-6 PET, location and to be NPO 6 hrs. He is also aware of 11-20 MD appointment

## 2011-11-09 NOTE — Progress Notes (Signed)
Subjective:    Patient ID: Steve [name], male    DOB: 09-08-1933, 78 y.o.   MRN: [id number]  HPI patient presents to clinic for hospital followup. Recently hospitalized for evaluation of pleural effusion felt to be malignant with atypical cells. Diagnosis now consistent with mesothelioma. Feels well. States has had discussions about possible chemotherapy and is scheduled for a PET scan in the near future. During hospitalization metoprolol was stopped because of heart rates in the 50s with first AV block. She was asymptomatic. Does have history of paroxysmal a fibrillation.  Past Medical History  Diagnosis Date  . Prostate enlargement   . History of chicken pox     childhood  . GERD (gastroesophageal reflux disease)   . Arthritis     knee and back  . AAA (abdominal aortic aneurysm)     stress test 09/14/10 EPIC  . Anxiety   . Dysrhythmia     hx of atrial fib post op x 2 days   . COPD (chronic obstructive pulmonary disease)   . Shortness of breath     with exertion   . Recurrent upper respiratory infection (URI)     productive cough- white phlegm- no fever  . Sleep apnea      12/12 sleep study,SEVERE per study  Dr Delton Coombes- states doesnt wear machine on regular basis- setting BiPAP 9/9  . Hypertension     chest x ray 12/12 EPIC- repeated 06/06/11, EKG 11/12 EPIC  . DVT (deep venous thrombosis) 08/2011  . Pneumonia   . Bruises easily     takes Xarelto   Past Surgical History  Procedure Date  . Knee surgery 1957    left knee arthotomy  . Abdominal aortic aneurysm repair 11/14/2010    AAA Repair    Dr Arbie Cookey  . Transurethral resection of prostate 01/25/2011    TURP  . Incisional hernia repair 06/12/2011    Procedure: HERNIA REPAIR INCISIONAL;  Surgeon: Velora Heckler, MD;  Location: [facility] ORS;  Service: General;  Laterality: N/A;  Open Repair Ventral Incisional Hernia with Mesh  . Hernia repair   . Video assisted thoracoscopy 11/02/2011    Procedure: VIDEO ASSISTED THORACOSCOPY;  Surgeon:  Loreli Slot, MD;  Location: [facility] OR;  Service: Thoracic;  Laterality: Right;  . Pleural biopsy 11/02/2011    Procedure: PLEURAL BIOPSY;  Surgeon: Loreli Slot, MD;  Location: [facility] OR;  Service: Thoracic;  Laterality: Right;  Parietal and visceral biopsies  . Chest tube insertion 11/02/2011    Procedure: INSERTION PLEURAL DRAINAGE CATHETER;  Surgeon: Loreli Slot, MD;  Location: [facility] OR;  Service: Thoracic;  Laterality: Right;  . Decortication 11/02/2011    Procedure: DECORTICATION;  Surgeon: Loreli Slot, MD;  Location: [facility] OR;  Service: Thoracic;  Laterality: Right;  . Pleural effusion drainage 11/02/2011    Procedure: DRAINAGE OF PLEURAL EFFUSION;  Surgeon: Loreli Slot, MD;  Location: [facility] OR;  Service: Thoracic;  Laterality: Right;    reports that he has quit smoking. His smoking use included Cigarettes. He started smoking 7 days ago. He has a 60 pack-year smoking history. He has never used smokeless tobacco. He reports that he does not drink alcohol or use illicit drugs. family history includes Arthritis in his mother; Diabetes in his mother; Heart failure in his daughter and mother; Hypertension in his mother; and Other in his daughter. Allergies  Allergen Reactions  . Lyrica (Pregabalin) Hives and Itching     Review of Systems  See  history of present illness     Objective:   Physical Exam  Nursing note and vitals reviewed. Constitutional: He appears well-developed and well-nourished.  Neurological: He is alert.  Psychiatric: He has a normal mood and affect.          Assessment & Plan:

## 2011-11-14 ENCOUNTER — Telehealth: Payer: Self-pay | Admitting: *Deleted

## 2011-11-14 NOTE — Telephone Encounter (Signed)
Lorain Childes [per our discussion 11.04.13]: Carrie w/[facility] called to report that pt had [2] falls over the weekend: had misstep coming down stairs & playing with dogs; Right hand & elbow swollen & soreness in right hip, all skin intact & pt able to move Ok, just soreness & swelling/SLS

## 2011-11-15 ENCOUNTER — Encounter (HOSPITAL_BASED_OUTPATIENT_CLINIC_OR_DEPARTMENT_OTHER)
Admission: RE | Admit: 2011-11-15 | Discharge: 2011-11-15 | Disposition: A | Discharge status: Home / Self Care | Payer: Medicare Other | Source: Ambulatory Visit | Attending: Hematology & Oncology | Admitting: Hematology & Oncology

## 2011-11-15 DIAGNOSIS — C384 Malignant neoplasm of pleura: Secondary | ICD-10-CM | POA: Insufficient documentation

## 2011-11-15 DIAGNOSIS — C450 Mesothelioma of pleura: Secondary | ICD-10-CM

## 2011-11-15 MED ORDER — FLUDEOXYGLUCOSE F - 18 (FDG) INJECTION
12.0000 | Freq: Once | INTRAVENOUS | Status: AC | PRN
  Administered 2011-11-15: 12 via INTRAVENOUS

## 2011-11-17 ENCOUNTER — Other Ambulatory Visit: Payer: Self-pay | Admitting: Thoracic Surgery (Cardiothoracic Vascular Surgery)

## 2011-11-17 ENCOUNTER — Ambulatory Visit: Payer: Medicare Other | Admitting: Internal Medicine

## 2011-11-17 DIAGNOSIS — I714 Abdominal aortic aneurysm, without rupture: Secondary | ICD-10-CM

## 2011-11-17 DIAGNOSIS — J449 Chronic obstructive pulmonary disease, unspecified: Secondary | ICD-10-CM

## 2011-11-21 ENCOUNTER — Encounter: Payer: Self-pay | Admitting: Thoracic Surgery (Cardiothoracic Vascular Surgery)

## 2011-11-21 ENCOUNTER — Telehealth: Payer: Self-pay | Admitting: Hematology & Oncology

## 2011-11-21 ENCOUNTER — Ambulatory Visit
Admission: RE | Admit: 2011-11-21 | Discharge: 2011-11-21 | Disposition: A | Discharge status: Home / Self Care | Payer: Medicare Other | Source: Ambulatory Visit | Attending: Thoracic Surgery (Cardiothoracic Vascular Surgery) | Admitting: Thoracic Surgery (Cardiothoracic Vascular Surgery)

## 2011-11-21 ENCOUNTER — Ambulatory Visit (INDEPENDENT_AMBULATORY_CARE_PROVIDER_SITE_OTHER): Payer: Self-pay | Admitting: Thoracic Surgery (Cardiothoracic Vascular Surgery)

## 2011-11-21 VITALS — BP 137/73 | HR 51 | Resp 18 | Ht 68.0 in | Wt 182.0 lb

## 2011-11-21 DIAGNOSIS — C384 Malignant neoplasm of pleura: Secondary | ICD-10-CM

## 2011-11-21 DIAGNOSIS — I714 Abdominal aortic aneurysm, without rupture: Secondary | ICD-10-CM

## 2011-11-21 DIAGNOSIS — J449 Chronic obstructive pulmonary disease, unspecified: Secondary | ICD-10-CM

## 2011-11-21 DIAGNOSIS — Z09 Encounter for follow-up examination after completed treatment for conditions other than malignant neoplasm: Secondary | ICD-10-CM

## 2011-11-21 DIAGNOSIS — J910 Malignant pleural effusion: Secondary | ICD-10-CM

## 2011-11-21 NOTE — Telephone Encounter (Signed)
I left a message for his wife that the PET scan was actually (-) for any metastatic mesothelioma.  We still know that he has mesothelioma by the path report.  I see him on 11/20. We will see about therapy at that time.  Pete E.

## 2011-11-21 NOTE — Progress Notes (Signed)
  HPI:  [name] returns today for postoperative follow up. He is a 78 year old gentleman who presented with a right pleural effusion. Thoracentesis showed atypical cells. We did a vats pleural biopsies and drainage of the pleural effusion and a Pleurx catheter placement. His pathology showed malignant mesothelioma. He has discussed starting chemotherapy with Dr. Myna Hidalgo.  He is doing well from an operative standpoint. He has minimal discomfort. He's draining between 200 and 350 milliliters from his Pleurx catheter on Monday Wednesday Friday schedule. He's not having discomfort with the drainage any more.   Current Outpatient Prescriptions  Medication Sig Dispense Refill  . albuterol (PROVENTIL HFA;VENTOLIN HFA) 108 (90 BASE) MCG/ACT inhaler Inhale 2 puffs into the lungs every 6 (six) hours as needed for wheezing.  1 Inhaler  2  . ALPRAZolam (XANAX) 0.25 MG tablet Take 1 tablet (0.25 mg total) by mouth 3 (three) times daily as needed for anxiety. For sleep  90 tablet  0  . Multiple Vitamin (MULTIVITAMIN WITH MINERALS) TABS Take 1 tablet by mouth daily.      . nicotine (NICODERM CQ - DOSED IN MG/24 HOURS) 14 mg/24hr patch Place 1 patch onto the skin daily at 6 (six) AM.  28 patch    . oxyCODONE-acetaminophen (PERCOCET/ROXICET) 5-325 MG per tablet Take 1-2 tablets by mouth every 6 (six) hours as needed.  30 tablet  0  . Rivaroxaban (XARELTO) 20 MG TABS Take 1 tablet (20 mg total) by mouth every morning.  30 tablet  3  . senna (SENOKOT) 8.6 MG tablet Take 2 tablets by mouth 2 (two) times daily.       . sertraline (ZOLOFT) 50 MG tablet Take 50 mg by mouth every morning.      Marland Kitchen SPIRIVA HANDIHALER 18 MCG inhalation capsule INHALE ONE DOSE BY MOUTH EVERY DAY  30 capsule  0  . metoprolol tartrate (LOPRESSOR) 25 MG tablet         Physical Exam BP 137/73  Pulse 51  Resp 18  Ht 5\' 8"  (1.727 m)  Wt 182 lb (82.555 kg)  BMI 27.67 kg/m2  SpO13 21% 78 year old male in no acute distress Lungs mildly  diminished at right base Incision well-healed Pleurx catheter in place  Diagnostic Tests: Chest x-ray today shows small right pleural effusion  Impression: 78 year old gentleman with limited malignant mesothelioma. He is being symptomatically controlled with a Pleurx catheter. He will be seen Dr. Myna Hidalgo soon to discuss starting chemotherapy.  We'll continue to drain his Pleurx catheter 3 times weekly for now. He may need a talc pleurodesis before all is said and done.   Plan:  Continue Monday Wednesday Friday Pleurx catheter drainage  Return in 3 weeks

## 2011-11-29 ENCOUNTER — Other Ambulatory Visit (HOSPITAL_BASED_OUTPATIENT_CLINIC_OR_DEPARTMENT_OTHER): Payer: Medicare Other | Admitting: Lab

## 2011-11-29 ENCOUNTER — Ambulatory Visit (HOSPITAL_BASED_OUTPATIENT_CLINIC_OR_DEPARTMENT_OTHER): Payer: Medicare Other | Admitting: Hematology & Oncology

## 2011-11-29 VITALS — BP 145/62 | HR 50 | Temp 98.2°F | Resp 18 | Ht 68.0 in | Wt 190.0 lb

## 2011-11-29 DIAGNOSIS — D472 Monoclonal gammopathy: Secondary | ICD-10-CM

## 2011-11-29 DIAGNOSIS — C384 Malignant neoplasm of pleura: Secondary | ICD-10-CM

## 2011-11-29 DIAGNOSIS — C450 Mesothelioma of pleura: Secondary | ICD-10-CM

## 2011-11-29 DIAGNOSIS — Z86718 Personal history of other venous thrombosis and embolism: Secondary | ICD-10-CM

## 2011-11-29 LAB — CBC WITH DIFFERENTIAL (CANCER CENTER ONLY)
Eosinophils Absolute: 0.8 10*3/uL — ABNORMAL HIGH (ref 0.0–0.5)
HGB: 14.3 g/dL (ref 13.0–17.1)
MCV: 95 fL (ref 82–98)
MONO#: 0.9 10*3/uL (ref 0.1–0.9)
NEUT#: 9.6 10*3/uL — ABNORMAL HIGH (ref 1.5–6.5)
Platelets: 193 10*3/uL (ref 145–400)
RBC: 4.51 10*6/uL (ref 4.20–5.70)
WBC: 13.4 10*3/uL — ABNORMAL HIGH (ref 4.0–10.0)

## 2011-12-06 ENCOUNTER — Other Ambulatory Visit: Payer: Self-pay | Admitting: Thoracic Diseases

## 2011-12-06 LAB — COMPREHENSIVE METABOLIC PANEL
Albumin: 3.5 g/dL (ref 3.5–5.2)
CO2: 25 mEq/L (ref 19–32)
Calcium: 9.6 mg/dL (ref 8.4–10.5)
Chloride: 103 mEq/L (ref 96–112)
Glucose, Bld: 79 mg/dL (ref 70–99)
Potassium: 3.9 mEq/L (ref 3.5–5.3)
Sodium: 141 mEq/L (ref 135–145)
Total Protein: 7.1 g/dL (ref 6.0–8.3)

## 2011-12-06 LAB — IGG, IGA, IGM
IgA: 1190 mg/dL — ABNORMAL HIGH (ref 68–379)
IgG (Immunoglobin G), Serum: 715 mg/dL (ref 650–1600)

## 2011-12-06 LAB — PROTEIN ELECTROPHORESIS, SERUM, WITH REFLEX
Albumin ELP: 45.7 % — ABNORMAL LOW (ref 55.8–66.1)
Beta 2: 5.2 % (ref 3.2–6.5)
Total Protein, Serum Electrophoresis: 7.1 g/dL (ref 6.0–8.3)

## 2011-12-06 NOTE — Progress Notes (Signed)
CC:   Marguarite Arbour, MD Salvatore Decent. Dorris Fetch, M.D.  DIAGNOSES: 1. Localized malignant mesothelioma of the right lung. 2. Deep venous thrombosis of the right leg (factor V Leiden     mutation/positive lupus anticoagulant). 3. IgA lambda MGUS.  CURRENT THERAPY:  Xarelto 20 mg p.o. daily.  INTERIM HISTORY:  Mr. Arntzen comes in for followup.  Unfortunately, it looks like he now is dealing with a mesothelioma.  This clearly is a problem.  We did have Dr. Dorris Fetch of thoracic surgery do a VATS procedure on him.  The pathology report ([id number]) showed malignant mesothelioma.  He does have a PleurX catheter in the right lung, which is drained every couple of days.  His wife brought in the chart for his volumes.  He drains about 200-300 mL every other day.  He does have an IgA lambda MGUS.  This I think is more reactive than anything.  He also was found to have Factor V Leiden mutation.  He is heterozygous for this mutation.  He is on Xarelto right now.  I suspect that he will be on Xarelto for long-term.  His performance status is still not that great.  I would say his performance status is probably ECOG 2-3.  PHYSICAL EXAMINATION:  General: This is an elderly appearing, white gentleman in no obvious distress.  Vital signs:  Temperature of 98.2, pulse 50, respiratory rate 18, blood pressure 145/62.  Weight is 190 pounds.  Head and neck:  Normocephalic, atraumatic skull.  There are no ocular or oral lesions.  There is no adenopathy in the neck.  Lungs: With decreased breath sounds over on the right side.  Left side is clear.  Cardiac:  Regular rate and rhythm with a normal S1 and S2. There are no murmurs, rubs or bruits.  Abdomen:  Soft with good bowel sounds.  There is no palpable abdominal mass.  There is no fluid wave. There is no palpable hepatosplenomegaly.  Back:  No tenderness over the spine, ribs, or hips.  He has a PleurX catheter in the right lateral chest wall.   Extremities:  Shows no clubbing, cyanosis or edema. Neurological:  Shows no focal neurological deficits.  LABORATORY STUDIES:  White blood cell count 13.4, hemoglobin 14.3, hematocrit 42.8, platelet count 193.  His M-spike was 1.21g/dL.  His prealbumin is 10.8.  His albumin is 3.5 with calcium 9.6.  IMPRESSION:  Mr. Sobalvarro is a 78 year old gentleman with multiple issues. Again, he does have mesothelioma of the right lung.  I forgot to mention that we did do a PET scan on him when we found out he had a mesothelioma.  Surprisingly enough, the PET scan did not show any activity over on the right side.  There is no obvious metastatic disease. I just do not think that we would be benefiting him by treating him with chemotherapy.  He is not a candidate for aggressive surgery for mesothelioma.  I believe keeping the Pleurx catheter in certainly is reasonable.  I suppose one possibility would be to do a talc pleurodesis.  I spent a good 45 minutes or so with he and his family.  They are all in agreement that we should just watch his mesothelioma right now.  They are keeping close record of his fluid volumes.  He is doing well on the Xarelto.  We will keep him on this for now.  Again, this is a pretty complicated situation.  We will just continue to follow him conservatively.  We will  plan to get him back to see [place] in another few weeks.  I probably would not do another PET scan on him until next year.    ______________________________ Josph Macho, M.D. PRE/MEDQ  D:  12/05/2011  T:  12/06/2011  Job:  [id number]

## 2011-12-11 ENCOUNTER — Other Ambulatory Visit: Payer: Self-pay | Admitting: Thoracic Surgery (Cardiothoracic Vascular Surgery)

## 2011-12-11 DIAGNOSIS — J90 Pleural effusion, not elsewhere classified: Secondary | ICD-10-CM

## 2011-12-12 ENCOUNTER — Ambulatory Visit
Admission: RE | Admit: 2011-12-12 | Discharge: 2011-12-12 | Disposition: A | Discharge status: Home / Self Care | Payer: Medicare Other | Source: Ambulatory Visit | Attending: Thoracic Surgery (Cardiothoracic Vascular Surgery) | Admitting: Thoracic Surgery (Cardiothoracic Vascular Surgery)

## 2011-12-12 ENCOUNTER — Encounter: Payer: Self-pay | Admitting: Thoracic Surgery (Cardiothoracic Vascular Surgery)

## 2011-12-12 ENCOUNTER — Ambulatory Visit (INDEPENDENT_AMBULATORY_CARE_PROVIDER_SITE_OTHER): Payer: Self-pay | Admitting: Thoracic Surgery (Cardiothoracic Vascular Surgery)

## 2011-12-12 ENCOUNTER — Other Ambulatory Visit: Payer: Self-pay | Admitting: Thoracic Surgery (Cardiothoracic Vascular Surgery)

## 2011-12-12 ENCOUNTER — Other Ambulatory Visit: Payer: Self-pay | Admitting: Thoracic Diseases

## 2011-12-12 VITALS — BP 138/70 | HR 51 | Resp 18 | Ht 68.0 in | Wt 188.0 lb

## 2011-12-12 DIAGNOSIS — C459 Mesothelioma, unspecified: Secondary | ICD-10-CM

## 2011-12-12 DIAGNOSIS — C457 Mesothelioma of other sites: Secondary | ICD-10-CM

## 2011-12-12 DIAGNOSIS — C3490 Malignant neoplasm of unspecified part of unspecified bronchus or lung: Secondary | ICD-10-CM

## 2011-12-12 DIAGNOSIS — J90 Pleural effusion, not elsewhere classified: Secondary | ICD-10-CM

## 2011-12-12 NOTE — Progress Notes (Signed)
HPI:  Mr. Blank is a 78 year old gentleman recently diagnosed with malignant mesothelioma. We did a right vats, drainage of effusion, decortication of the lower lobe and placement of a Pleurx catheter on 11/02/2011. He was last seen in the office on 11/21/2011. At that time he was draining between 200 and 300 cc of fluid with each drainage 3 times a week. He is continued on 3 times a week drainage since then but over the last 4 times is drained 75, 50, 0, and 50 cc. During that time he has not noted any problems with his breathing.  Past Medical History  Diagnosis Date  . Prostate enlargement   . History of chicken pox     childhood  . GERD (gastroesophageal reflux disease)   . Arthritis     knee and back  . AAA (abdominal aortic aneurysm)     stress test 09/14/10 EPIC  . Anxiety   . Dysrhythmia     hx of atrial fib post op x 2 days   . COPD (chronic obstructive pulmonary disease)   . Shortness of breath     with exertion   . Recurrent upper respiratory infection (URI)     productive cough- white phlegm- no fever  . Sleep apnea      12/12 sleep study,SEVERE per study  Dr Delton Coombes- states doesnt wear machine on regular basis- setting BiPAP 9/9  . Hypertension     chest x ray 12/12 EPIC- repeated 06/06/11, EKG 11/12 EPIC  . DVT (deep venous thrombosis) 08/2011  . Pneumonia   . Bruises easily     takes Xarelto      Current Outpatient Prescriptions  Medication Sig Dispense Refill  . albuterol (PROVENTIL HFA;VENTOLIN HFA) 108 (90 BASE) MCG/ACT inhaler Inhale 2 puffs into the lungs every 6 (six) hours as needed for wheezing.  1 Inhaler  2  . ALPRAZolam (XANAX) 0.25 MG tablet Take 1 tablet (0.25 mg total) by mouth 3 (three) times daily as needed for anxiety. For sleep  90 tablet  0  . beta carotene w/minerals (OCUVITE) tablet Take 1 tablet by mouth daily.      . metoprolol tartrate (LOPRESSOR) 25 MG tablet TAKE ONE TABLET BY MOUTH TWICE DAILY  60 tablet  0  . Rivaroxaban (XARELTO) 20 MG  TABS Take 1 tablet (20 mg total) by mouth every morning.  30 tablet  3  . senna (SENOKOT) 8.6 MG tablet Take 2 tablets by mouth 2 (two) times daily.       . sertraline (ZOLOFT) 50 MG tablet Take 50 mg by mouth every morning.      Marland Kitchen SPIRIVA HANDIHALER 18 MCG inhalation capsule INHALE ONE DOSE BY MOUTH EVERY DAY  30 capsule  0    Physical Exam BP 138/70  Pulse 51  Resp 18  Ht 5\' 8"  (1.727 m)  Wt 188 lb (85.276 kg)  BMI 28.59 kg/m2  SpO53 32% 78 year old male in no acute distress Diminished breath sounds right base, otherwise clear  Diagnostic Tests:  No chest x-ray today  Impression: 78 year old gentleman with malignant mesothelioma with a right Pleurx catheter. He has had a marked decrease in the amount of drainage from the catheter over the past week and a half. Unfortunately he did not get a chest x-ray today and I would like to see one to make sure that he's not accumulating a significant effusion. We will go ahead and order that.  We will change him to twice weekly drainage. If he continues  to only drained small amounts we will consider removal of the Pleurx at his next office visit in 2 weeks.  Plan: PA and lateral chest x-ray today  Return to the office in 2 weeks with repeat PA and lateral chest x-ray.

## 2011-12-14 ENCOUNTER — Other Ambulatory Visit: Payer: Self-pay | Admitting: Emergency Medicine

## 2011-12-25 ENCOUNTER — Ambulatory Visit
Admission: RE | Admit: 2011-12-25 | Discharge: 2011-12-25 | Disposition: A | Discharge status: Home / Self Care | Payer: Medicare Other | Source: Ambulatory Visit | Attending: Thoracic Surgery (Cardiothoracic Vascular Surgery) | Admitting: Thoracic Surgery (Cardiothoracic Vascular Surgery)

## 2011-12-25 ENCOUNTER — Ambulatory Visit (INDEPENDENT_AMBULATORY_CARE_PROVIDER_SITE_OTHER): Payer: Self-pay | Admitting: Thoracic Surgery (Cardiothoracic Vascular Surgery)

## 2011-12-25 ENCOUNTER — Encounter: Payer: Self-pay | Admitting: Thoracic Surgery (Cardiothoracic Vascular Surgery)

## 2011-12-25 VITALS — BP 137/73 | HR 51 | Resp 18 | Ht 68.0 in | Wt 188.0 lb

## 2011-12-25 DIAGNOSIS — J90 Pleural effusion, not elsewhere classified: Secondary | ICD-10-CM

## 2011-12-25 DIAGNOSIS — Z09 Encounter for follow-up examination after completed treatment for conditions other than malignant neoplasm: Secondary | ICD-10-CM

## 2011-12-25 NOTE — Progress Notes (Signed)
  HPI:  Mr. Lenoir returns today for further management of his Pleurx catheter.  He was recently diagnosed with malignant mesothelioma of the right chest. Draining a right pleural effusion with a Pleurx catheter since October. Initially the drainage was in the 200- 350 mL brain every 2-3 days. Hour before his last office visit he had had a dramatic decrease in the amount of drainage. Since that visit he's continued to drain twice weekly and has had minimal drainage each time. Ranging from 0-40 mL. His chest x-ray today shows slight decrease in the effusion. He's not having difficulty with his breathing.   Current Outpatient Prescriptions  Medication Sig Dispense Refill  . albuterol (PROVENTIL HFA;VENTOLIN HFA) 108 (90 BASE) MCG/ACT inhaler Inhale 2 puffs into the lungs every 6 (six) hours as needed for wheezing.  1 Inhaler  2  . ALPRAZolam (XANAX) 0.25 MG tablet Take 1 tablet (0.25 mg total) by mouth 3 (three) times daily as needed for anxiety. For sleep  90 tablet  0  . beta carotene w/minerals (OCUVITE) tablet Take 1 tablet by mouth daily.      . metoprolol tartrate (LOPRESSOR) 25 MG tablet TAKE ONE TABLET BY MOUTH TWICE DAILY  60 tablet  0  . Rivaroxaban (XARELTO) 20 MG TABS Take 1 tablet (20 mg total) by mouth every morning.  30 tablet  3  . senna (SENOKOT) 8.6 MG tablet Take 2 tablets by mouth 2 (two) times daily.       . sertraline (ZOLOFT) 50 MG tablet Take 50 mg by mouth every morning.      Marland Kitchen SPIRIVA HANDIHALER 18 MCG inhalation capsule INHALE ONE DOSE BY MOUTH EVERY DAY  30 capsule  0    Physical Exam BP 137/73  Pulse 51  Resp 18  Ht 5\' 8"  (1.727 m)  Wt 188 lb (85.276 kg)  BMI 28.59 kg/m2  SpO2 91% Lungs clear, equal breath sounds Incision is well-healed  Diagnostic Tests: Chest x-ray 12/25/2011 CHEST - 2 VIEW  Comparison: Chest x-ray of 12/12/2011 and 11/21/2011  Findings: There is little change perhaps very minimal decrease in  volume of the right pleural effusion with  right pleurex catheter  remaining. Right basilar atelectasis is unchanged. The left lung  is clear. Heart size is stable.  IMPRESSION:  Little change to minimal decrease in the small right pleural  effusion with right pleurex catheter remaining.  Impression: 78 year old with a right pleural effusion secondary to mesothelioma. This has been managed for the Pleurx catheter. He has had a little to no drainage for almost 3 weeks now and during that time his chest x-ray has improved slightly.  I offered him the option of removal here in the office versus talc pleurodesis in short stay prior to catheter removal. I discussed the relative advantages and disadvantages of each approach. The advantage of talc is decreased risk for recurrence, the disadvantage is a talc reaction and pain. After consideration he wishes to have the catheter removed in the office today does not wish to have talc pleurodesis.   Plan:  Remove the Pleurx catheter.  Procedure note  The area around the catheter exit site was sterilely prepped. 1% lidocaine was used to locally anesthetize the skin at the exit site. With traction on the catheter, the cuff was mobilized from surrounding tissue, and the catheter was removed intact without difficulty. The patient tolerated this well.  Plan  Patient will return in one month with a PA and lateral chest x-ray

## 2011-12-26 ENCOUNTER — Ambulatory Visit: Payer: Self-pay | Admitting: Thoracic Surgery (Cardiothoracic Vascular Surgery)

## 2012-01-08 ENCOUNTER — Other Ambulatory Visit: Payer: Self-pay | Admitting: *Deleted

## 2012-01-08 DIAGNOSIS — Z0279 Encounter for issue of other medical certificate: Secondary | ICD-10-CM

## 2012-01-09 ENCOUNTER — Ambulatory Visit (HOSPITAL_BASED_OUTPATIENT_CLINIC_OR_DEPARTMENT_OTHER): Payer: Medicare Other | Admitting: Medical

## 2012-01-09 ENCOUNTER — Ambulatory Visit (HOSPITAL_BASED_OUTPATIENT_CLINIC_OR_DEPARTMENT_OTHER): Payer: Medicare Other | Admitting: Lab

## 2012-01-09 VITALS — BP 180/70 | HR 60 | Temp 97.5°F | Resp 20 | Ht 68.0 in | Wt 192.0 lb

## 2012-01-09 DIAGNOSIS — C450 Mesothelioma of pleura: Secondary | ICD-10-CM

## 2012-01-09 DIAGNOSIS — I82409 Acute embolism and thrombosis of unspecified deep veins of unspecified lower extremity: Secondary | ICD-10-CM

## 2012-01-09 DIAGNOSIS — C3490 Malignant neoplasm of unspecified part of unspecified bronchus or lung: Secondary | ICD-10-CM

## 2012-01-09 DIAGNOSIS — D6859 Other primary thrombophilia: Secondary | ICD-10-CM

## 2012-01-09 DIAGNOSIS — D472 Monoclonal gammopathy: Secondary | ICD-10-CM

## 2012-01-09 LAB — CBC WITH DIFFERENTIAL (CANCER CENTER ONLY)
Eosinophils Absolute: 0.2 10*3/uL (ref 0.0–0.5)
LYMPH%: 21.1 % (ref 14.0–48.0)
MCH: 31.4 pg (ref 28.0–33.4)
MCV: 95 fL (ref 82–98)
MONO%: 7.3 % (ref 0.0–13.0)
Platelets: 152 10*3/uL (ref 145–400)
RBC: 4.65 10*6/uL (ref 4.20–5.70)

## 2012-01-09 LAB — BASIC METABOLIC PANEL - CANCER CENTER ONLY
BUN, Bld: 18 mg/dL (ref 7–22)
Calcium: 9.6 mg/dL (ref 8.0–10.3)
Creat: 1.3 mg/dl — ABNORMAL HIGH (ref 0.6–1.2)
Glucose, Bld: 94 mg/dL (ref 73–118)
Sodium: 143 mEq/L (ref 128–145)

## 2012-01-09 NOTE — Progress Notes (Signed)
Diagnoses: #1 localized malignant mesothelioma of the right lung. #2.  Deep venous thrombosis of the right leg (factor V Leiden mutation./Positive lupus anticoagulant). #3.  IgA lambda MGUS.  Current therapy: #1. Xarelto 20 mg po daily  Interim history: Mr. Aversa presents today for an office followup visit.  He recently had his Pleurx catheter removed by Dr. Dorris Fetch on 12/25/2011.  He's been doing quite well.  He's not had any breathing difficulties.  He still continues to followup with Dr. Dorris Fetch.  He does get chest x-rays.  He's not reporting any type of pain.  He denies abdominal pain, back pain, or bone pain.  He's not reporting much neuropathic pain from his VATS procedure.  We will plan for another PET scan next year.  In terms of his DVT and factor V Leiden mutation, he remains on Xarelto without any complications.  He does not report any bleeding.  He will be on anticoagulation long term.  In terms of his IgA lambda MGUS, he is less studies in November revealed an IgA of 1190, M spike 1.2, 1 g/dL, his prealbumin is 16.1, with an albumin of 3.5, and calcium of 9.6.  We will continue to monitor these studies.  His ECOG performance status is around 2.  He, reports, he has a good appetite.  He denies any nausea, vomiting, diarrhea, constipation, chest pain, shortness of breath, or cough.  He denies any fevers, chills, or night sweats.  He denies any headaches, vision, changes, or rashes.  He denies any lower leg swelling.  He denies any obvious, or abnormal bleeding.  For the most part, he is doing quite well.  Review of Systems: Constitutional:Negative for malaise/fatigue, fever, chills, weight loss, diaphoresis, activity change, appetite change, and unexpected weight change.  HEENT: Negative for double vision, blurred vision, visual loss, ear pain, tinnitus, congestion, rhinorrhea, epistaxis sore throat or sinus disease, oral pain/lesion, tongue soreness Respiratory: Negative for cough,  chest tightness, shortness of breath, wheezing and stridor.  Cardiovascular: Negative for chest pain, palpitations, leg swelling, orthopnea, PND, DOE or claudication Gastrointestinal: Negative for nausea, vomiting, abdominal pain, diarrhea, constipation, blood in stool, melena, hematochezia, abdominal distention, anal bleeding, rectal pain, anorexia and hematemesis.  Genitourinary: Negative for dysuria, frequency, hematuria,  Musculoskeletal: Negative for myalgias, back pain, joint swelling, arthralgias and gait problem.  Skin: Negative for rash, color change, pallor and wound.  Neurological:. Negative for dizziness/light-headedness, tremors, seizures, syncope, facial asymmetry, speech difficulty, weakness, numbness, headaches and paresthesias.  Hematological: Negative for adenopathy. Does not bruise/bleed easily.  Psychiatric/Behavioral:  Negative for depression, no loss of interest in normal activity or change in sleep pattern.   Physical Exam: This is a 78, showed, elderly, appearing, white gentleman, in no obvious distress Vitals: Temperature 97.5 degrees, pulse 60, respirations 20, blood pressure 180/70, weight 192 pounds HEENT reveals a normocephalic, atraumatic skull, no scleral icterus, no oral lesions  Neck is supple without any cervical or supraclavicular adenopathy.  Lungs are clear to auscultation bilaterally. There are no wheezes, rales or rhonci Cardiac is regular rate and rhythm with a normal S1 and S2. There are no murmurs, rubs, or bruits.  Abdomen is soft with good bowel sounds, there is no palpable mass. There is no palpable hepatosplenomegaly. There is no palpable fluid wave.  Musculoskeletal no tenderness of the spine, ribs, or hips.  Extremities there are no clubbing, cyanosis, or edema.  Skin no petechia, purpura or ecchymosis Neurologic is nonfocal.  Laboratory Data: White count 7.4, hemoglobin 14.6, hematocrit 44.0,  platelets 152,000  Current Outpatient Prescriptions  on File Prior to Visit  Medication Sig Dispense Refill  . albuterol (PROVENTIL HFA;VENTOLIN HFA) 108 (90 BASE) MCG/ACT inhaler Inhale 2 puffs into the lungs every 6 (six) hours as needed for wheezing.  1 Inhaler  2  . ALPRAZolam (XANAX) 0.25 MG tablet Take 1 tablet (0.25 mg total) by mouth 3 (three) times daily as needed for anxiety. For sleep  90 tablet  0  . beta carotene w/minerals (OCUVITE) tablet Take 1 tablet by mouth daily.      . metoprolol tartrate (LOPRESSOR) 25 MG tablet TAKE ONE TABLET BY MOUTH TWICE DAILY  60 tablet  0  . Rivaroxaban (XARELTO) 20 MG TABS Take 1 tablet (20 mg total) by mouth every morning.  30 tablet  3  . senna (SENOKOT) 8.6 MG tablet Take 2 tablets by mouth 2 (two) times daily.       . sertraline (ZOLOFT) 50 MG tablet Take 50 mg by mouth every morning.      Marland Kitchen SPIRIVA HANDIHALER 18 MCG inhalation capsule INHALE ONE DOSE BY MOUTH EVERY DAY  30 capsule  0   Assessment/Plan: This is a very pleasant, 78 year old, white gentleman, with the following issues:  #1.  Mesothelioma of the right lung.  He recently had his Pleurx catheter removed on 12/25/2011.  He is doing quite well.  We will plan for another PET scan when he comes back for his next visit.  #2.  DVT of the right leg (factor V Leiden mutation./Positive lupus anticoagulant). He remains on Xarelto without any complications.  He will be on Xarelto long-term.    #3.  IgA lambda MGUS.  We will continue to monitor this for the time being.   #4.  Followup.  We will follow back up with Mr. Steppe in about 4 weeks, but before then should there be questions or concerns.

## 2012-01-17 ENCOUNTER — Emergency Department (HOSPITAL_BASED_OUTPATIENT_CLINIC_OR_DEPARTMENT_OTHER): Payer: Medicare Other

## 2012-01-17 ENCOUNTER — Encounter (HOSPITAL_BASED_OUTPATIENT_CLINIC_OR_DEPARTMENT_OTHER): Payer: Self-pay | Admitting: *Deleted

## 2012-01-17 ENCOUNTER — Telehealth: Payer: Self-pay | Admitting: *Deleted

## 2012-01-17 ENCOUNTER — Inpatient Hospital Stay (HOSPITAL_BASED_OUTPATIENT_CLINIC_OR_DEPARTMENT_OTHER)
Admission: EM | Admit: 2012-01-17 | Discharge: 2012-01-24 | DRG: 409 | Disposition: A | Discharge status: Home / Self Care | Payer: Medicare Other | Attending: Internal Medicine | Admitting: Internal Medicine

## 2012-01-17 DIAGNOSIS — J189 Pneumonia, unspecified organism: Secondary | ICD-10-CM

## 2012-01-17 DIAGNOSIS — F3289 Other specified depressive episodes: Secondary | ICD-10-CM | POA: Diagnosis present

## 2012-01-17 DIAGNOSIS — K810 Acute cholecystitis: Principal | ICD-10-CM | POA: Diagnosis present

## 2012-01-17 DIAGNOSIS — R42 Dizziness and giddiness: Secondary | ICD-10-CM

## 2012-01-17 DIAGNOSIS — K439 Ventral hernia without obstruction or gangrene: Secondary | ICD-10-CM

## 2012-01-17 DIAGNOSIS — K432 Incisional hernia without obstruction or gangrene: Secondary | ICD-10-CM

## 2012-01-17 DIAGNOSIS — I480 Paroxysmal atrial fibrillation: Secondary | ICD-10-CM

## 2012-01-17 DIAGNOSIS — K819 Cholecystitis, unspecified: Secondary | ICD-10-CM

## 2012-01-17 DIAGNOSIS — C450 Mesothelioma of pleura: Secondary | ICD-10-CM

## 2012-01-17 DIAGNOSIS — C3490 Malignant neoplasm of unspecified part of unspecified bronchus or lung: Secondary | ICD-10-CM | POA: Diagnosis present

## 2012-01-17 DIAGNOSIS — Z86718 Personal history of other venous thrombosis and embolism: Secondary | ICD-10-CM

## 2012-01-17 DIAGNOSIS — Z9889 Other specified postprocedural states: Secondary | ICD-10-CM

## 2012-01-17 DIAGNOSIS — Z8719 Personal history of other diseases of the digestive system: Secondary | ICD-10-CM

## 2012-01-17 DIAGNOSIS — Z720 Tobacco use: Secondary | ICD-10-CM

## 2012-01-17 DIAGNOSIS — K219 Gastro-esophageal reflux disease without esophagitis: Secondary | ICD-10-CM | POA: Diagnosis present

## 2012-01-17 DIAGNOSIS — I82409 Acute embolism and thrombosis of unspecified deep veins of unspecified lower extremity: Secondary | ICD-10-CM

## 2012-01-17 DIAGNOSIS — I719 Aortic aneurysm of unspecified site, without rupture: Secondary | ICD-10-CM

## 2012-01-17 DIAGNOSIS — F32A Depression, unspecified: Secondary | ICD-10-CM | POA: Diagnosis present

## 2012-01-17 DIAGNOSIS — J449 Chronic obstructive pulmonary disease, unspecified: Secondary | ICD-10-CM | POA: Diagnosis present

## 2012-01-17 DIAGNOSIS — F329 Major depressive disorder, single episode, unspecified: Secondary | ICD-10-CM | POA: Diagnosis present

## 2012-01-17 DIAGNOSIS — M5416 Radiculopathy, lumbar region: Secondary | ICD-10-CM

## 2012-01-17 DIAGNOSIS — S3613XA Injury of bile duct, initial encounter: Secondary | ICD-10-CM

## 2012-01-17 DIAGNOSIS — J4489 Other specified chronic obstructive pulmonary disease: Secondary | ICD-10-CM | POA: Diagnosis present

## 2012-01-17 DIAGNOSIS — R001 Bradycardia, unspecified: Secondary | ICD-10-CM

## 2012-01-17 DIAGNOSIS — Z79899 Other long term (current) drug therapy: Secondary | ICD-10-CM

## 2012-01-17 DIAGNOSIS — G4730 Sleep apnea, unspecified: Secondary | ICD-10-CM

## 2012-01-17 DIAGNOSIS — N400 Enlarged prostate without lower urinary tract symptoms: Secondary | ICD-10-CM

## 2012-01-17 DIAGNOSIS — I10 Essential (primary) hypertension: Secondary | ICD-10-CM | POA: Diagnosis present

## 2012-01-17 DIAGNOSIS — G4710 Hypersomnia, unspecified: Secondary | ICD-10-CM

## 2012-01-17 DIAGNOSIS — E876 Hypokalemia: Secondary | ICD-10-CM | POA: Diagnosis present

## 2012-01-17 DIAGNOSIS — IMO0002 Reserved for concepts with insufficient information to code with codable children: Secondary | ICD-10-CM

## 2012-01-17 DIAGNOSIS — N390 Urinary tract infection, site not specified: Secondary | ICD-10-CM | POA: Diagnosis present

## 2012-01-17 DIAGNOSIS — R2681 Unsteadiness on feet: Secondary | ICD-10-CM

## 2012-01-17 DIAGNOSIS — I350 Nonrheumatic aortic (valve) stenosis: Secondary | ICD-10-CM

## 2012-01-17 DIAGNOSIS — G4700 Insomnia, unspecified: Secondary | ICD-10-CM

## 2012-01-17 DIAGNOSIS — J9620 Acute and chronic respiratory failure, unspecified whether with hypoxia or hypercapnia: Secondary | ICD-10-CM

## 2012-01-17 DIAGNOSIS — I4891 Unspecified atrial fibrillation: Secondary | ICD-10-CM | POA: Diagnosis present

## 2012-01-17 DIAGNOSIS — I714 Abdominal aortic aneurysm, without rupture: Secondary | ICD-10-CM

## 2012-01-17 LAB — URINALYSIS, ROUTINE W REFLEX MICROSCOPIC
Ketones, ur: 15 mg/dL — AB
Nitrite: POSITIVE — AB
Protein, ur: 100 mg/dL — AB

## 2012-01-17 LAB — URINE MICROSCOPIC-ADD ON

## 2012-01-17 LAB — LIPASE, BLOOD: Lipase: 13 U/L (ref 11–59)

## 2012-01-17 LAB — COMPREHENSIVE METABOLIC PANEL
BUN: 22 mg/dL (ref 6–23)
CO2: 26 mEq/L (ref 19–32)
Chloride: 97 mEq/L (ref 96–112)
Creatinine, Ser: 1.1 mg/dL (ref 0.50–1.35)
GFR calc non Af Amer: 63 mL/min — ABNORMAL LOW (ref >90)
Total Bilirubin: 0.8 mg/dL (ref 0.3–1.2)

## 2012-01-17 LAB — CBC WITH DIFFERENTIAL/PLATELET
Basophils Relative: 0 % (ref 0–1)
HCT: 43.6 % (ref 39.0–52.0)
Hemoglobin: 14.5 g/dL (ref 13.0–17.0)
Lymphocytes Relative: 10 % — ABNORMAL LOW (ref 12–46)
MCHC: 33.3 g/dL (ref 30.0–36.0)
Monocytes Absolute: 0.9 10*3/uL (ref 0.1–1.0)
Monocytes Relative: 7 % (ref 3–12)
Neutro Abs: 11.6 10*3/uL — ABNORMAL HIGH (ref 1.7–7.7)

## 2012-01-17 MED ORDER — METOPROLOL TARTRATE 25 MG PO TABS
25.0000 mg | ORAL_TABLET | Freq: Two times a day (BID) | ORAL | Status: DC
  Administered 2012-01-18 – 2012-01-24 (×9): 25 mg via ORAL
  Filled 2012-01-17 (×15): qty 1

## 2012-01-17 MED ORDER — HEPARIN SODIUM (PORCINE) 5000 UNIT/ML IJ SOLN: 5000.0000 [IU] | Freq: Three times a day (TID) | INTRAMUSCULAR | Status: DC

## 2012-01-17 MED ORDER — SERTRALINE HCL 50 MG PO TABS
50.0000 mg | ORAL_TABLET | Freq: Every morning | ORAL | Status: DC
  Administered 2012-01-18 – 2012-01-24 (×7): 50 mg via ORAL
  Filled 2012-01-17 (×7): qty 1

## 2012-01-17 MED ORDER — GI COCKTAIL ~~LOC~~
30.0000 mL | Freq: Once | ORAL | Status: AC
  Administered 2012-01-17: 30 mL via ORAL
  Filled 2012-01-17: qty 30

## 2012-01-17 MED ORDER — ONDANSETRON HCL 4 MG PO TABS: 4.0000 mg | ORAL_TABLET | Freq: Four times a day (QID) | ORAL | Status: DC | PRN

## 2012-01-17 MED ORDER — TIOTROPIUM BROMIDE MONOHYDRATE 18 MCG IN CAPS
18.0000 ug | ORAL_CAPSULE | Freq: Every day | RESPIRATORY_TRACT | Status: DC
  Administered 2012-01-18 – 2012-01-24 (×6): 18 ug via RESPIRATORY_TRACT
  Filled 2012-01-17 (×2): qty 5

## 2012-01-17 MED ORDER — SENNA 8.6 MG PO TABS
2.0000 | ORAL_TABLET | Freq: Two times a day (BID) | ORAL | Status: DC
  Administered 2012-01-18 – 2012-01-24 (×12): 17.2 mg via ORAL
  Filled 2012-01-17 (×15): qty 2

## 2012-01-17 MED ORDER — METRONIDAZOLE IN NACL 5-0.79 MG/ML-% IV SOLN
500.0000 mg | Freq: Three times a day (TID) | INTRAVENOUS | Status: DC
  Administered 2012-01-18 – 2012-01-24 (×18): 500 mg via INTRAVENOUS
  Filled 2012-01-17 (×22): qty 100

## 2012-01-17 MED ORDER — SODIUM CHLORIDE 0.9 % IV SOLN
INTRAVENOUS | Status: AC
  Administered 2012-01-18: 01:00:00 via INTRAVENOUS

## 2012-01-17 MED ORDER — OCUVITE PO TABS
1.0000 | ORAL_TABLET | Freq: Every day | ORAL | Status: DC
  Administered 2012-01-18 – 2012-01-24 (×7): 1 via ORAL
  Filled 2012-01-17 (×9): qty 1

## 2012-01-17 MED ORDER — ONDANSETRON HCL 4 MG/2ML IJ SOLN
4.0000 mg | Freq: Four times a day (QID) | INTRAMUSCULAR | Status: DC | PRN
  Administered 2012-01-22: 4 mg via INTRAVENOUS

## 2012-01-17 MED ORDER — ALPRAZOLAM 0.25 MG PO TABS
0.2500 mg | ORAL_TABLET | Freq: Three times a day (TID) | ORAL | Status: DC | PRN
  Administered 2012-01-20 – 2012-01-23 (×3): 0.25 mg via ORAL
  Filled 2012-01-17 (×3): qty 1

## 2012-01-17 MED ORDER — CIPROFLOXACIN IN D5W 400 MG/200ML IV SOLN
400.0000 mg | Freq: Once | INTRAVENOUS | Status: AC
  Administered 2012-01-17: 400 mg via INTRAVENOUS
  Filled 2012-01-17: qty 200

## 2012-01-17 MED ORDER — CIPROFLOXACIN IN D5W 400 MG/200ML IV SOLN
400.0000 mg | Freq: Two times a day (BID) | INTRAVENOUS | Status: DC
  Administered 2012-01-18 – 2012-01-24 (×13): 400 mg via INTRAVENOUS
  Filled 2012-01-17 (×15): qty 200

## 2012-01-17 MED ORDER — METRONIDAZOLE IN NACL 5-0.79 MG/ML-% IV SOLN
500.0000 mg | Freq: Once | INTRAVENOUS | Status: AC
  Administered 2012-01-17: 500 mg via INTRAVENOUS
  Filled 2012-01-17: qty 100

## 2012-01-17 MED ORDER — ALBUTEROL SULFATE HFA 108 (90 BASE) MCG/ACT IN AERS: 2.0000 | INHALATION_SPRAY | Freq: Four times a day (QID) | RESPIRATORY_TRACT | Status: DC | PRN

## 2012-01-17 NOTE — ED Notes (Signed)
[facility] here for transport now. 

## 2012-01-17 NOTE — ED Provider Notes (Signed)
History     CSN: [id number]  Arrival date & time 01/17/12  1621   First MD Initiated Contact with Patient 01/17/12 1633      Chief Complaint  Patient presents with  . Abdominal Pain    (Consider location/radiation/quality/duration/timing/severity/associated sxs/prior treatment) HPI  The patient presents with concerns of ongoing abdominal pain.  Notes that this began approximately 3 days ago, after a large meal.  Initially the patient had a profound nausea and one episode of emesis.  Since onset the nausea has improved, and he has no additional episodes of emesis.  He continues to have bowel movements.  The pain is diffuse, though seems prominently in the inferior abdomen with radiation superiorly. No relief with OTC medication. The pain seems to be reproduced when the patient stands or coughs. The patient has a chronic cough, chronic dyspnea. The patient has a history of COPD, mesothelioma.  2 weeks ago he had a right pleural catheter removed. The patient also has a history of AAA, repaired with a graft one year ago.  Past Medical History  Diagnosis Date  . Prostate enlargement   . History of chicken pox     childhood  . GERD (gastroesophageal reflux disease)   . Arthritis     knee and back  . AAA (abdominal aortic aneurysm)     stress test 09/14/10 EPIC  . Anxiety   . Dysrhythmia     hx of atrial fib post op x 2 days   . COPD (chronic obstructive pulmonary disease)   . Shortness of breath     with exertion   . Recurrent upper respiratory infection (URI)     productive cough- white phlegm- no fever  . Sleep apnea      12/12 sleep study,SEVERE per study  Dr Delton Coombes- states doesnt wear machine on regular basis- setting BiPAP 9/9  . Hypertension     chest x ray 12/12 EPIC- repeated 06/06/11, EKG 11/12 EPIC  . DVT (deep venous thrombosis) 08/2011  . Pneumonia   . Bruises easily     takes Xarelto    Past Surgical History  Procedure Date  . Knee surgery 1957    left knee  arthotomy  . Abdominal aortic aneurysm repair 11/14/2010    AAA Repair    Dr Arbie Cookey  . Transurethral resection of prostate 01/25/2011    TURP  . Incisional hernia repair 06/12/2011    Procedure: HERNIA REPAIR INCISIONAL;  Surgeon: Velora Heckler, MD;  Location: [facility]S;  Service: General;  Laterality: N/A;  Open Repair Ventral Incisional Hernia with Mesh  . Hernia repair   . Video assisted thoracoscopy 11/02/2011    Procedure: VIDEO ASSISTED THORACOSCOPY;  Surgeon: Loreli Slot, MD;  Location: [facility] OR;  Service: Thoracic;  Laterality: Right;  . Pleural biopsy 11/02/2011    Procedure: PLEURAL BIOPSY;  Surgeon: Loreli Slot, MD;  Location: [facility] OR;  Service: Thoracic;  Laterality: Right;  Parietal and visceral biopsies  . Chest tube insertion 11/02/2011    Procedure: INSERTION PLEURAL DRAINAGE CATHETER;  Surgeon: Loreli Slot, MD;  Location: [facility] OR;  Service: Thoracic;  Laterality: Right;  . Decortication 11/02/2011    Procedure: DECORTICATION;  Surgeon: Loreli Slot, MD;  Location: [facility] OR;  Service: Thoracic;  Laterality: Right;  . Pleural effusion drainage 11/02/2011    Procedure: DRAINAGE OF PLEURAL EFFUSION;  Surgeon: Loreli Slot, MD;  Location: [facility] OR;  Service: Thoracic;  Laterality: Right;    Family History  Problem Relation Age of Onset  . Arthritis Mother   . Heart failure Mother   . Hypertension Mother   . Diabetes Mother   . Heart failure Daughter     deceased age 16  . Other Daughter     deceased age 42 MVA    History  Substance Use Topics  . Smoking status: Former Smoker -- 1.0 packs/day for 60 years    Types: Cigarettes    Start date: 11/02/2011  . Smokeless tobacco: Never Used  . Alcohol Use: No      Review of Systems  Constitutional:       Per HPI, otherwise negative  HENT:       Per HPI, otherwise negative  Eyes: Negative.   Respiratory:       Per HPI, otherwise negative  Cardiovascular:       Per HPI, otherwise negative    Gastrointestinal: Positive for nausea and vomiting. Negative for diarrhea.  Genitourinary: Negative.   Musculoskeletal:       Per HPI, otherwise negative  Skin: Negative.   Neurological: Negative for syncope.    Allergies  Lyrica  Home Medications   Current Outpatient Rx  Name  Route  Sig  Dispense  Refill  . ALBUTEROL SULFATE HFA 108 (90 BASE) MCG/ACT IN AERS   Inhalation   Inhale 2 puffs into the lungs every 6 (six) hours as needed for wheezing.   1 Inhaler   2   . ALPRAZOLAM 0.25 MG PO TABS   Oral   Take 1 tablet (0.25 mg total) by mouth 3 (three) times daily as needed for anxiety. For sleep   90 tablet   0   . OCUVITE PO TABS   Oral   Take 1 tablet by mouth daily.         Marland Kitchen METOPROLOL TARTRATE 25 MG PO TABS      TAKE ONE TABLET BY MOUTH TWICE DAILY   60 tablet   0   . RIVAROXABAN 20 MG PO TABS   Oral   Take 1 tablet (20 mg total) by mouth every morning.   30 tablet   3   . SENNOSIDES 8.6 MG PO TABS   Oral   Take 2 tablets by mouth 2 (two) times daily.          . SERTRALINE HCL 50 MG PO TABS   Oral   Take 50 mg by mouth every morning.         Marland Kitchen SPIRIVA HANDIHALER 18 MCG IN CAPS      INHALE ONE DOSE BY MOUTH EVERY DAY   30 capsule   0     BP 145/65  Pulse 87  Temp 98.7 F (37.1 C) (Oral)  Resp 16  Ht 5\' 10"  (1.778 m)  Wt 184 lb (83.462 kg)  BMI 26.40 kg/m2  SpO2 100%  Physical Exam  Nursing note and vitals reviewed. Constitutional: He is oriented to person, place, and time. He appears well-developed. He appears ill. No distress.  HENT:  Head: Normocephalic and atraumatic.  Eyes: Conjunctivae normal and EOM are normal.  Cardiovascular: Normal rate and regular rhythm.   Pulmonary/Chest: Effort normal. No stridor. No respiratory distress. He has decreased breath sounds.  Abdominal: He exhibits no distension.    Musculoskeletal: He exhibits no edema.  Neurological: He is alert and oriented to person, place, and time.  Skin: Skin  is warm and dry.  Psychiatric: He has a normal mood and affect.  ED Course  Procedures (including critical care time)   Labs Reviewed  CBC WITH DIFFERENTIAL  COMPREHENSIVE METABOLIC PANEL  LIPASE, BLOOD  URINALYSIS, ROUTINE W REFLEX MICROSCOPIC  PROTIME-INR   No results found.   No diagnosis found.  O2 92% room air abnormal Cardiac 91 sinus rhythm normal   Date: 01/17/2012  Rate: 89  Rhythm: normal sinus rhythm  QRS Axis: left  Intervals: QT prolonged  ST/T Wave abnormalities: nonspecific T wave changes  Conduction Disutrbances:left anterior fascicular block  Narrative Interpretation:   Old EKG Reviewed: changes noted  Longer qt - abnormal  6:02 PM Patient updated - he continues to c/o abd pain - ct ordered with consideration of AAA compromise vs. nephrolithiasis / pyelo  Update: Patient re-evaluated - he now has pain in RUQ, after being informed of CT results.   With UA results and concern for cholecystitis, he will receive cipro / flagyl.  IVF running.  Update: I discussed the patient's care with our surgeon on call, who requests admission to [facility], so the patient does not need emergent intervention, he will likely need procedure tomorrow.  The hospitalist at [facility] long deferred admission.  The patient was subsequently admitted to [facility] for further evaluation and management.  MDM  This patient with multiple medical problems, including mesothelioma, and recent removal of a pleural catheter, infrarenal abdominal aortic aneurysm, repaired one year ago, and chronic A. fib now anticoagulated presents with new abdominal pain, nausea, vomiting. Given his MMP, particularly the AAA w anticoagulation there was initial concern for hemorrhage, with consideration of infection or progression of malignancy high on the differential.  The patient's evaluation was notable for demonstration of acute cholecystitis as well as a urinary tract infection.   There was no CT (or lab) e/o AAA compromise.  The patient required admission for further E/M.  Given his co morbidities, he was admitted to the hospitalist service.        Gerhard Munch, MD 01/17/12 [phone]

## 2012-01-17 NOTE — ED Notes (Signed)
Patient transported to Ultrasound 

## 2012-01-17 NOTE — Progress Notes (Signed)
Pt has arrived from [facility] via [facility] stretcher. NAD. Will continue to monitor.

## 2012-01-17 NOTE — Telephone Encounter (Signed)
Patient's spouse reports that pt is having nausea with vomiting & a lot of pain in Right Abdomen & side; wanting to know if they should wait until tomorrow to see provider. Informed caller that patient should be taken to UC/ED for A&E, where they can perform possible testing needed to r/o such possibilities as appendix, gallbladder, and/or kidney stones; pt's spouse understood & agreed/SLS

## 2012-01-17 NOTE — ED Notes (Signed)
Pt c/o n/v x 2 days with diffuse abd pain

## 2012-01-18 DIAGNOSIS — I4891 Unspecified atrial fibrillation: Secondary | ICD-10-CM

## 2012-01-18 DIAGNOSIS — K8010 Calculus of gallbladder with chronic cholecystitis without obstruction: Secondary | ICD-10-CM

## 2012-01-18 DIAGNOSIS — E876 Hypokalemia: Secondary | ICD-10-CM | POA: Diagnosis present

## 2012-01-18 DIAGNOSIS — Z86718 Personal history of other venous thrombosis and embolism: Secondary | ICD-10-CM

## 2012-01-18 DIAGNOSIS — N390 Urinary tract infection, site not specified: Secondary | ICD-10-CM

## 2012-01-18 DIAGNOSIS — K810 Acute cholecystitis: Principal | ICD-10-CM

## 2012-01-18 DIAGNOSIS — C384 Malignant neoplasm of pleura: Secondary | ICD-10-CM

## 2012-01-18 LAB — URINE CULTURE

## 2012-01-18 LAB — HEPARIN LEVEL (UNFRACTIONATED)
Heparin Unfractionated: <0.1 IU/mL — ABNORMAL LOW (ref 0.30–0.70)
Heparin Unfractionated: 0.19 IU/mL — ABNORMAL LOW (ref 0.30–0.70)

## 2012-01-18 LAB — BASIC METABOLIC PANEL
BUN: 22 mg/dL (ref 6–23)
Calcium: 8.9 mg/dL (ref 8.4–10.5)
Creatinine, Ser: 1.01 mg/dL (ref 0.50–1.35)
GFR calc Af Amer: 81 mL/min — ABNORMAL LOW (ref >90)

## 2012-01-18 LAB — CBC
HCT: 36.7 % — ABNORMAL LOW (ref 39.0–52.0)
MCH: 30.5 pg (ref 26.0–34.0)
MCV: 92.4 fL (ref 78.0–100.0)
Platelets: 147 10*3/uL — ABNORMAL LOW (ref 150–400)
RDW: 14.2 % (ref 11.5–15.5)

## 2012-01-18 MED ORDER — HEPARIN (PORCINE) IN NACL 100-0.45 UNIT/ML-% IJ SOLN
1450.0000 [IU]/h | INTRAMUSCULAR | Status: DC
  Filled 2012-01-18: qty 250

## 2012-01-18 MED ORDER — HEPARIN BOLUS VIA INFUSION
1000.0000 [IU] | Freq: Once | INTRAVENOUS | Status: AC
  Administered 2012-01-18: 1000 [IU] via INTRAVENOUS
  Filled 2012-01-18: qty 1000

## 2012-01-18 MED ORDER — HEPARIN (PORCINE) IN NACL 100-0.45 UNIT/ML-% IJ SOLN
1750.0000 [IU]/h | INTRAMUSCULAR | Status: DC
  Administered 2012-01-18: 1600 [IU]/h via INTRAVENOUS
  Administered 2012-01-18: 1750 [IU]/h via INTRAVENOUS
  Filled 2012-01-18 (×3): qty 250

## 2012-01-18 MED ORDER — HEPARIN (PORCINE) IN NACL 100-0.45 UNIT/ML-% IJ SOLN
1450.0000 [IU]/h | INTRAMUSCULAR | Status: DC
  Administered 2012-01-18: 1450 [IU]/h via INTRAVENOUS
  Filled 2012-01-18 (×2): qty 250

## 2012-01-18 MED ORDER — HEPARIN BOLUS VIA INFUSION
4500.0000 [IU] | Freq: Once | INTRAVENOUS | Status: DC
  Filled 2012-01-18: qty 4500

## 2012-01-18 MED ORDER — NICOTINE 14 MG/24HR TD PT24
14.0000 mg | MEDICATED_PATCH | Freq: Every day | TRANSDERMAL | Status: DC
  Administered 2012-01-18 – 2012-01-24 (×7): 14 mg via TRANSDERMAL
  Filled 2012-01-18 (×7): qty 1

## 2012-01-18 MED ORDER — HEPARIN BOLUS VIA INFUSION
4500.0000 [IU] | Freq: Once | INTRAVENOUS | Status: AC
  Administered 2012-01-18: 4500 [IU] via INTRAVENOUS
  Filled 2012-01-18: qty 4500

## 2012-01-18 MED ORDER — POTASSIUM CHLORIDE 10 MEQ/100ML IV SOLN
10.0000 meq | INTRAVENOUS | Status: AC
  Administered 2012-01-18 (×4): 10 meq via INTRAVENOUS
  Filled 2012-01-18 (×4): qty 100

## 2012-01-18 MED ORDER — HEPARIN BOLUS VIA INFUSION
1500.0000 [IU] | Freq: Once | INTRAVENOUS | Status: AC
  Administered 2012-01-18: 1500 [IU] via INTRAVENOUS
  Filled 2012-01-18: qty 1500

## 2012-01-18 NOTE — Progress Notes (Signed)
ANTICOAGULATION CONSULT NOTE - Initial Consult  Pharmacy Consult for heparin Indication: H/o DVT  Allergies  Allergen Reactions  . Lyrica (Pregabalin) Hives and Itching    Patient Measurements: Height: 5\' 9"  (175.3 cm) Weight: 187 lb 9.8 oz (85.1 kg) IBW/kg (Calculated) : 70.7  Heparin Dosing Weight: 85 kg   Vital Signs: Temp: 99.3 F (37.4 C) (01/08 2343) Temp src: Oral (01/08 2343) BP: 120/64 mmHg (01/08 2343) Pulse Rate: 68  (01/08 2343)  Labs:  Basename 01/18/12 0005 01/17/12 1645  HGB -- 14.5  HCT -- 43.6  PLT -- 150  APTT 35 --  LABPROT -- 14.3  INR -- 1.13  HEPARINUNFRC <0.10* --  CREATININE -- 1.10  CKTOTAL -- --  CKMB -- --  TROPONINI -- --    Estimated Creatinine Clearance: 61.8 ml/min (by C-G formula based on Cr of 1.1).   Medical History: Past Medical History  Diagnosis Date  . Prostate enlargement   . History of chicken pox     childhood  . GERD (gastroesophageal reflux disease)   . Arthritis     knee and back  . AAA (abdominal aortic aneurysm)     stress test 09/14/10 EPIC  . Anxiety   . Dysrhythmia     hx of atrial fib post op x 2 days   . COPD (chronic obstructive pulmonary disease)   . Shortness of breath     with exertion   . Recurrent upper respiratory infection (URI)     productive cough- white phlegm- no fever  . Sleep apnea      12/12 sleep study,SEVERE per study  Dr Delton Coombes- states doesnt wear machine on regular basis- setting BiPAP 9/9  . Hypertension     chest x ray 12/12 EPIC- repeated 06/06/11, EKG 11/12 EPIC  . DVT (deep venous thrombosis) 08/2011  . Pneumonia   . Bruises easily     takes Xarelto    Medications:  Scheduled:    . sodium chloride   Intravenous STAT  . beta carotene w/minerals  1 tablet Oral Daily  . [COMPLETED] ciprofloxacin  400 mg Intravenous Once  . ciprofloxacin  400 mg Intravenous Q12H  . [COMPLETED] gi cocktail  30 mL Oral Once  . metoprolol tartrate  25 mg Oral BID  . [COMPLETED] metronidazole   500 mg Intravenous Once  . metronidazole  500 mg Intravenous Q8H  . senna  2 tablet Oral BID  . sertraline  50 mg Oral q morning - 10a  . tiotropium  18 mcg Inhalation Daily  . [DISCONTINUED] heparin  5,000 Units Subcutaneous Q8H    Assessment: 79 yo male admitted with acute cholecystitis. Patient was on rivaroxaban 20mg  nightly prior to admission for h/o DVT, factor V Leiden mutation / lupus anticoagulant. Per patient, last dose of rivaroxaban was on 1/7 PM. Baseline aPTT is WNL and baseline heparin level is < 0.1 (lab re-ran sample and came back with same < 0.1). Undetectable heparin level indicates no residual rivaroxaban effects on heparin level, and therefore heparin can be monitored using heparin levels (instead of aPTT).   Goal of Therapy:  Heparin level 0.3-0.7 units/ml Monitor platelets by anticoagulation protocol: Yes   Plan:  1. Heparin IV bolus of 4500 units x 1, then heparin infusion of 1450 units/hr.  2. Heparin level in 8 hours.  3. Daily CBC, heparin level.   Emeline Gins 01/18/2012,1:45 AM

## 2012-01-18 NOTE — Progress Notes (Signed)
Plan lap chole with IOC in Heparin window tomorrow. NPO after MN. I discussed the procedure in detail. We discussed the risks and benefits of a laparoscopic cholecystectomy and possible cholangiogram including, but not limited to bleeding, infection, injury to surrounding structures such as the intestine or liver, bile leak, retained gallstones, need to convert to an open procedure, prolonged diarrhea, blood clots such as  DVT, common bile duct injury, anesthesia risks, and possible need for additional procedures.  The likelihood of improvement in symptoms and return to the patient's normal status is good. We discussed the typical post-operative recovery course. I spoke to the patient and his wife.  He agrees. Patient examined and I agree with the assessment and plan  Violeta Gelinas, MD, MPH, FACS Pager: [phone]  01/18/2012 4:43 PM

## 2012-01-18 NOTE — H&P (Signed)
[facility] History and Physical  Steve [name] AVW:[id number] DOB: 17-Jun-1933 DOA: 01/17/2012  Referring physician: ED PCP: Letitia Libra, Ala Dach, MD  Specialists: [facility] Surgery, Dr. [name]  Chief Complaint: Abdominal pain, N/V  HPI: Steve [name] is a 79 y.o. male who presents with c/o 3 day history of abdominal pain.  Profound nausea and one episode of emesis.  Pain is worst in the RUQ.  Worse with eating especially a large meal, better without eating.  In the ED patient was found to have acute cholecystitis, ED physician spoke with general surgeon on call who asked that the patient be admitted to hospitalist service.  Review of Systems: 12 systems reviewed and otherwise negative.  Past Medical History  Diagnosis Date  . Prostate enlargement   . History of chicken pox     childhood  . GERD (gastroesophageal reflux disease)   . Arthritis     knee and back  . AAA (abdominal aortic aneurysm)     stress test 09/14/10 EPIC  . Anxiety   . Dysrhythmia     hx of atrial fib post op x 2 days   . COPD (chronic obstructive pulmonary disease)   . Shortness of breath     with exertion   . Recurrent upper respiratory infection (URI)     productive cough- white phlegm- no fever  . Sleep apnea      12/12 sleep study,SEVERE per study  Dr Delton Coombes- states doesnt wear machine on regular basis- setting BiPAP 9/9  . Hypertension     chest x ray 12/12 EPIC- repeated 06/06/11, EKG 11/12 EPIC  . DVT (deep venous thrombosis) 08/2011  . Pneumonia   . Bruises easily     takes Xarelto   Past Surgical History  Procedure Date  . Knee surgery 1957    left knee arthotomy  . Abdominal aortic aneurysm repair 11/14/2010    AAA Repair    Dr Arbie Cookey  . Transurethral resection of prostate 01/25/2011    TURP  . Incisional hernia repair 06/12/2011    Procedure: HERNIA REPAIR INCISIONAL;  Surgeon: Velora Heckler, MD;  Location: [facility] ORS;  Service: General;  Laterality: N/A;  Open Repair Ventral  Incisional Hernia with Mesh  . Hernia repair   . Video assisted thoracoscopy 11/02/2011    Procedure: VIDEO ASSISTED THORACOSCOPY;  Surgeon: Loreli Slot, MD;  Location: [facility] OR;  Service: Thoracic;  Laterality: Right;  . Pleural biopsy 11/02/2011    Procedure: PLEURAL BIOPSY;  Surgeon: Loreli Slot, MD;  Location: [facility] OR;  Service: Thoracic;  Laterality: Right;  Parietal and visceral biopsies  . Chest tube insertion 11/02/2011    Procedure: INSERTION PLEURAL DRAINAGE CATHETER;  Surgeon: Loreli Slot, MD;  Location: [facility] OR;  Service: Thoracic;  Laterality: Right;  . Decortication 11/02/2011    Procedure: DECORTICATION;  Surgeon: Loreli Slot, MD;  Location: [facility] OR;  Service: Thoracic;  Laterality: Right;  . Pleural effusion drainage 11/02/2011    Procedure: DRAINAGE OF PLEURAL EFFUSION;  Surgeon: Loreli Slot, MD;  Location: [facility] OR;  Service: Thoracic;  Laterality: Right;   Social History:  reports that he has been smoking Cigarettes.  He started smoking about 2 months ago. He has a 30 pack-year smoking history. He has never used smokeless tobacco. He reports that he does not drink alcohol or use illicit drugs.   Allergies  Allergen Reactions  . Lyrica (Pregabalin) Hives and Itching    Family History  Problem  Relation Age of Onset  . Arthritis Mother   . Heart failure Mother   . Hypertension Mother   . Diabetes Mother   . Heart failure Daughter     deceased age 30  . Other Daughter     deceased age 33 MVA    Prior to Admission medications   Medication Sig Start Date End Date Taking? Authorizing Provider  albuterol (PROVENTIL HFA;VENTOLIN HFA) 108 (90 BASE) MCG/ACT inhaler Inhale 2 puffs into the lungs every 6 (six) hours as needed for wheezing. 06/20/11 06/19/12 Yes Simbiso Ranga, MD  ALPRAZolam (XANAX) 0.25 MG tablet Take 1 tablet (0.25 mg total) by mouth 3 (three) times daily as needed for anxiety. For sleep 10/26/11  Yes Edwyna Perfect, MD    beta carotene w/minerals (OCUVITE) tablet Take 1 tablet by mouth daily.   Yes Historical Provider, MD  metoprolol tartrate (LOPRESSOR) 25 MG tablet TAKE ONE TABLET BY MOUTH TWICE DAILY 12/12/11  Yes Fransisco Hertz, MD  Rivaroxaban (XARELTO) 20 MG TABS Take 1 tablet (20 mg total) by mouth every morning. 11/01/11  Yes Josph Macho, MD  senna (SENOKOT) 8.6 MG tablet Take 2 tablets by mouth 2 (two) times daily.    Yes Historical Provider, MD  sertraline (ZOLOFT) 50 MG tablet Take 50 mg by mouth every morning.   Yes Historical Provider, MD  SPIRIVA HANDIHALER 18 MCG inhalation capsule INHALE ONE DOSE BY MOUTH EVERY DAY 12/14/11  Yes Leslye Peer, MD   Physical Exam: Filed Vitals:   01/17/12 1832 01/17/12 2008 01/17/12 2208 01/17/12 2343  BP: 129/56  135/66 120/64  Pulse: 78   68  Temp:  98.6 F (37 C)  99.3 F (37.4 C)  TempSrc:    Oral  Resp: 24  22 18   Height:    5\' 9"  (1.753 m)  Weight:    85.1 kg (187 lb 9.8 oz)  SpO2: 98%   95%    General:  NAD, resting comfortably in bed Eyes: PEERLA EOMI ENT: mucous membranes moist Neck: supple w/o JVD Cardiovascular: RRR w/o MRG Respiratory: CTA B Abdomen: soft, RUQ tenderness, positive Murphy's sign, nd, bs+ Skin: no rash nor lesion Musculoskeletal: MAE, full ROM all 4 extremities Psychiatric: normal tone and affect Neurologic: AAOx3, grossly non-focal  Labs on Admission:  Basic Metabolic Panel:  Lab 01/17/12 9604  NA 138  K 3.2*  CL 97  CO2 26  GLUCOSE 130*  BUN 22  CREATININE 1.10  CALCIUM 9.8  MG --  PHOS --   Liver Function Tests:  Lab 01/17/12 1645  AST 37  ALT 72*  ALKPHOS 151*  BILITOT 0.8  PROT 7.9  ALBUMIN 3.0*    Lab 01/17/12 1645  LIPASE 13  AMYLASE --   No results found for this basename: AMMONIA:5 in the last 168 hours CBC:  Lab 01/17/12 1645  WBC 14.1*  NEUTROABS 11.6*  HGB 14.5  HCT 43.6  MCV 93.2  PLT 150   Cardiac Enzymes: No results found for this basename:  CKTOTAL:5,CKMB:5,CKMBINDEX:5,TROPONINI:5 in the last 168 hours  BNP (last 3 results)  Basename 06/17/11 0918  PROBNP 919.3*   CBG: No results found for this basename: GLUCAP:5 in the last 168 hours  Radiological Exams on Admission: Ct Abdomen Pelvis Wo Contrast  01/17/2012  *RADIOLOGY REPORT*  Clinical Data: Right lower quadrant pain, hematuria  CT ABDOMEN AND PELVIS WITHOUT CONTRAST  Technique:  Multidetector CT imaging of the abdomen and pelvis was performed following the standard protocol without  intravenous contrast.  Comparison: 09/20/2011  Findings: There is small right pleural effusion with right base posteriorly atelectasis or infiltrate.  Sagittal images of the spine is stable compression fracture of the L1 vertebral body.  Enhanced liver shows no biliary ductal dilatation.  There is a distended gallbladder.  There is thickening of the gallbladder wall.  There is significant stranding of pericholecystic fat. Findings are consistent with acute cholecystitis.  No calcified gallstones are noted within gallbladder. The pancreas and spleen are unremarkable.  A tiny nodule left adrenal gland is stable. Right adrenal gland is unremarkable.  Stable probable cyst in the upper pole of the kidney.  Atherosclerotic calcifications of the abdominal aorta and the iliac arteries.  Unenhanced kidneys are symmetrical in size.  No nephrolithiasis.  No hydronephrosis or hydroureter.  No pericecal inflammation.  Normal appendix is partially visualized in axial image 63.  The urinary bladder is unremarkable.  Probable prior TURP.  Gas noted within rectum.  No distal colonic obstruction.  IMPRESSION:  1.  There is distended gallbladder with marked thickened wall and stranding of the surrounding fat.  Findings are consistent with acute cholecystitis.  Further evaluation with gallbladder ultrasound is recommended. 2.  No nephrolithiasis.  No hydronephrosis or hydroureter. 3.  Stable cyst in upper pole of the right  kidney. 4.  No pericecal inflammation.  Normal appendix partially visualized. 5.  There is small right pleural effusion with right base posterior atelectasis or infiltrate.  Critical findings discussed Dr. Jeraldine Loots.   Original Report Authenticated By: Natasha Mead, M.D.    US Abdomen Complete  01/17/2012  *RADIOLOGY REPORT*  Clinical Data:  Abdominal pain. Nausea and vomiting.  COMPLETE ABDOMINAL ULTRASOUND  Comparison:  CT performed earlier.  Findings:  Gallbladder:  There is tenderness over the gallbladder to sonographic interrogation.  The gallbladder wall is thickened and contains multiple stones.  Gallbladder wall thickness is increased at 4.5 mm.  There is slight pericholecystic fluid.  Common bile duct:  Normal measuring 2.6 mm.  Liver:  Diffuse echogenicity.  No focal lesions.  IVC:  Unable to visualize due to bowel gas.  Pancreas:  Unable to visualize due to bowel gas.  Spleen:  Normal measuring 8.3 cm  Right Kidney:  No hydronephrosis or renal mass.  No calcifications. Normal length 11.6 cm  Left Kidney:  As no hydronephrosis or renal mass.  No calcifications.  Normal length of 13.0 cm.  Abdominal aorta:  Sonographic suspected measurement of 4.4 cm appears spurious.  There is extensive bowel gas which obscures visualization.  The aorta was nonaneurysmal on CT scan and heavily calcified.  IMPRESSION: Findings consistent with cholelithiasis and acute cholecystitis. Good correlation with CT appearance.  Hepatic steatosis.   Original Report Authenticated By: Davonna Belling, M.D.    Dg Abd Acute W/chest  01/17/2012  *RADIOLOGY REPORT*  Clinical Data: Lower abdominal pain radiates in the epigastric area.  No chest pain.  History of COPD  ACUTE ABDOMEN SERIES (ABDOMEN 2 VIEW & CHEST 1 VIEW)  Comparison: .  12/25/2011.  Findings: Right pleural effusion persists. Pleurx catheter has been removed.  Mild right basilar atelectasis.  Cardiomegaly with tortuous aorta.  No infiltrate, failure, or pneumothorax.  Similar  appearance to priors.  Scattered air-fluid levels without dilated loops of small or large bowel.  No obstruction or free air.  Vascular calcification.  IMPRESSION: Possible mild ileus.  Chronic right pleural effusion.  No obstruction or free air.  No active infiltrates or failure.   Original Report Authenticated  By: Davonna Belling, M.D.     EKG: Independently reviewed.  Assessment/Plan Principal Problem:  *Acute cholecystitis   1. Acute cholecystitis - have spoken with Dr. Magnus Ivan who is on call for [facility] they will eval patient in AM, abx in the mean time, NPO for now, switching xeralto to heparin gtt in anticipation of some sort of procedure (drainage vs surgical cholecystectomy). 2. A.Fib - switching xeralto to heparin gtt 3. Mesothelioma - s/p VATS, unclear if this resection was curative or not at this point, per discussion with surgery they will probably try to figure this out in the AM since it may have an impact on which procedure they choose. 4. HTN - continue home meds 5. UTI - very mild UTI, patient already on cipro for #1  Spoke with Dr. Magnus Ivan in consultation.  Code Status: Full Code (must indicate code status--if unknown or must be presumed, indicate so) Family Communication: spoke with wife at bedside (indicate person spoken with, if applicable, with phone number if by telephone) Disposition Plan: Admit to inpatient (indicate anticipated LOS)  Time spent: 70 min  GARDNER, JARED M. [facility] Pager [phone]  If 7PM-7AM, please contact night-coverage [email or web address] Password [email or web address] 01/18/2012, 12:01 AM

## 2012-01-18 NOTE — Progress Notes (Signed)
ANTICOAGULATION CONSULT NOTE - Follow Up Consult  Pharmacy Consult for Heparin Indication: Hx recent DVT (Aug '13) while Xarelto on hold  Allergies  Allergen Reactions  . Lyrica (Pregabalin) Hives and Itching    Patient Measurements: Height: 5\' 9"  (175.3 cm) Weight: 187 lb 9.8 oz (85.1 kg) IBW/kg (Calculated) : 70.7  Heparin Dosing Weight: 85.1 kg  Vital Signs: Temp: 97.5 F (36.4 C) (01/09 0925) Temp src: Oral (01/09 0434) BP: 115/59 mmHg (01/09 0925) Pulse Rate: 53  (01/09 0925)  Labs:  Basename 01/18/12 0917 01/18/12 0500 01/18/12 0005 01/17/12 1645  HGB -- 12.1* -- 14.5  HCT -- 36.7* -- 43.6  PLT -- 147* -- 150  APTT -- -- 35 --  LABPROT -- -- -- 14.3  INR -- -- -- 1.13  HEPARINUNFRC 0.24* -- <0.10* --  CREATININE -- 1.01 -- 1.10  CKTOTAL -- -- -- --  CKMB -- -- -- --  TROPONINI -- -- -- --    Estimated Creatinine Clearance: 67.3 ml/min (by C-G formula based on Cr of 1.01).   Medications:  Heparin at 1450 units/hr (14.5 ml/hr)  Assessment: 79 y.o. M who presented to the [facility] on 01/18/12 with abdominal pain and was found to have acute cholecystitis. PTA the patient was on Xarelto for history of a recent DVT in Aug 2013 as well as factor V Leiden mutation and lupus anticoagulant positive. Given possible plans for surgery or drain placement -- Xarelto was held, and pharmacy was asked to start heparin bridge.   The patient's last Xarelto dose PTA was on 01/16/12. A baseline heparin level prior to starting the heparin drip reflected an undetectable heparin level -- which idicates no residual rivaroxaban effects on heparin level, and therefore heparin can be monitored using heparin levels (instead of aPTT).   Heparin level this morning is SUBtherapeutic, however drawn ~2 hours early. (HL 0.24, goal of 0.3-0.7). Hgb/Hct/Plt slight drop. No active s/sx of bleeding noted. Hep wt~85 kg. Will go ahead with rate increase.   Goal of Therapy:  Heparin level 0.3-0.7  units/ml Monitor platelets by anticoagulation protocol: Yes   Plan:  1. Heparin 1000 unit bolus x 1 2. Increase heparin drip rate to 1600 units/hr (16 ml/hr) 3. Will continue to monitor for any signs/symptoms of bleeding and will follow up with heparin level in 8 hours   Georgina Pillion, PharmD, BCPS Clinical Pharmacist Pager: [phone] 01/18/2012 10:42 AM

## 2012-01-18 NOTE — Progress Notes (Signed)
Patient ID: AROLDO GALLI, male   DOB: 1933/07/22, 79 y.o.   MRN: [id number]    Subjective: Pt up going to bathroom, pain, nausea and vomiting better.  Objective: Vital signs in last 24 hours: Temp:  [97.5 F (36.4 C)-99.3 F (37.4 C)] 97.5 F (36.4 C) (01/09 0925) Pulse Rate:  [53-87] 53  (01/09 0925) Resp:  [16-24] 18  (01/09 0925) BP: (115-145)/(56-66) 115/59 mmHg (01/09 0925) SpO2:  [95 %-100 %] 95 % (01/09 0925) Weight:  [184 lb (83.462 kg)-187 lb 9.8 oz (85.1 kg)] 187 lb 9.8 oz (85.1 kg) (01/08 2343) Last BM Date: 01/17/12  Intake/Output from previous day: 01/08 0701 - 01/09 0700 In: 508.7 [P.O.:100; I.V.:308.7; IV Piggyback:100] Out: -  Intake/Output this shift: Total I/O In: 0  Out: 275 [Urine:275]  PE: Abd: soft, mildly tender, +BS General: alert, no acute distress  Lab Results:   Basename 01/18/12 0500 01/17/12 1645  WBC 11.5* 14.1*  HGB 12.1* 14.5  HCT 36.7* 43.6  PLT 147* 150   BMET  Basename 01/18/12 0500 01/17/12 1645  NA 135 138  K 3.1* 3.2*  CL 100 97  CO2 27 26  GLUCOSE 94 130*  BUN 22 22  CREATININE 1.01 1.10  CALCIUM 8.9 9.8   PT/INR  Basename 01/17/12 1645  LABPROT 14.3  INR 1.13   CMP     Component Value Date/Time   NA 135 01/18/2012 0500   NA 143 01/09/2012 1100   K 3.1* 01/18/2012 0500   K 3.9 01/09/2012 1100   CL 100 01/18/2012 0500   CL 103 01/09/2012 1100   CO2 27 01/18/2012 0500   CO2 31 01/09/2012 1100   GLUCOSE 94 01/18/2012 0500   GLUCOSE 94 01/09/2012 1100   BUN 22 01/18/2012 0500   BUN 18 01/09/2012 1100   CREATININE 1.01 01/18/2012 0500   CREATININE 1.3* 01/09/2012 1100   CALCIUM 8.9 01/18/2012 0500   CALCIUM 9.6 01/09/2012 1100   PROT 7.9 01/17/2012 1645   ALBUMIN 3.0* 01/17/2012 1645   AST 37 01/17/2012 1645   ALT 72* 01/17/2012 1645   ALKPHOS 151* 01/17/2012 1645   BILITOT 0.8 01/17/2012 1645   GFRNONAA 70* 01/18/2012 0500   GFRAA 81* 01/18/2012 0500   Lipase     Component Value Date/Time   LIPASE 13 01/17/2012 1645        Studies/Results: Ct Abdomen Pelvis Wo Contrast  01/17/2012  *RADIOLOGY REPORT*  Clinical Data: Right lower quadrant pain, hematuria  CT ABDOMEN AND PELVIS WITHOUT CONTRAST  Technique:  Multidetector CT imaging of the abdomen and pelvis was performed following the standard protocol without intravenous contrast.  Comparison: 09/20/2011  Findings: There is small right pleural effusion with right base posteriorly atelectasis or infiltrate.  Sagittal images of the spine is stable compression fracture of the L1 vertebral body.  Enhanced liver shows no biliary ductal dilatation.  There is a distended gallbladder.  There is thickening of the gallbladder wall.  There is significant stranding of pericholecystic fat. Findings are consistent with acute cholecystitis.  No calcified gallstones are noted within gallbladder. The pancreas and spleen are unremarkable.  A tiny nodule left adrenal gland is stable. Right adrenal gland is unremarkable.  Stable probable cyst in the upper pole of the kidney.  Atherosclerotic calcifications of the abdominal aorta and the iliac arteries.  Unenhanced kidneys are symmetrical in size.  No nephrolithiasis.  No hydronephrosis or hydroureter.  No pericecal inflammation.  Normal appendix is partially visualized in axial image 63.  The urinary bladder is unremarkable.  Probable prior TURP.  Gas noted within rectum.  No distal colonic obstruction.  IMPRESSION:  1.  There is distended gallbladder with marked thickened wall and stranding of the surrounding fat.  Findings are consistent with acute cholecystitis.  Further evaluation with gallbladder ultrasound is recommended. 2.  No nephrolithiasis.  No hydronephrosis or hydroureter. 3.  Stable cyst in upper pole of the right kidney. 4.  No pericecal inflammation.  Normal appendix partially visualized. 5.  There is small right pleural effusion with right base posterior atelectasis or infiltrate.  Critical findings discussed Dr. Jeraldine Loots.    Original Report Authenticated By: Natasha Mead, M.D.    US Abdomen Complete  01/17/2012  *RADIOLOGY REPORT*  Clinical Data:  Abdominal pain. Nausea and vomiting.  COMPLETE ABDOMINAL ULTRASOUND  Comparison:  CT performed earlier.  Findings:  Gallbladder:  There is tenderness over the gallbladder to sonographic interrogation.  The gallbladder wall is thickened and contains multiple stones.  Gallbladder wall thickness is increased at 4.5 mm.  There is slight pericholecystic fluid.  Common bile duct:  Normal measuring 2.6 mm.  Liver:  Diffuse echogenicity.  No focal lesions.  IVC:  Unable to visualize due to bowel gas.  Pancreas:  Unable to visualize due to bowel gas.  Spleen:  Normal measuring 8.3 cm  Right Kidney:  No hydronephrosis or renal mass.  No calcifications. Normal length 11.6 cm  Left Kidney:  As no hydronephrosis or renal mass.  No calcifications.  Normal length of 13.0 cm.  Abdominal aorta:  Sonographic suspected measurement of 4.4 cm appears spurious.  There is extensive bowel gas which obscures visualization.  The aorta was nonaneurysmal on CT scan and heavily calcified.  IMPRESSION: Findings consistent with cholelithiasis and acute cholecystitis. Good correlation with CT appearance.  Hepatic steatosis.   Original Report Authenticated By: Davonna Belling, M.D.    Dg Abd Acute W/chest  01/17/2012  *RADIOLOGY REPORT*  Clinical Data: Lower abdominal pain radiates in the epigastric area.  No chest pain.  History of COPD  ACUTE ABDOMEN SERIES (ABDOMEN 2 VIEW & CHEST 1 VIEW)  Comparison: .  12/25/2011.  Findings: Right pleural effusion persists. Pleurx catheter has been removed.  Mild right basilar atelectasis.  Cardiomegaly with tortuous aorta.  No infiltrate, failure, or pneumothorax.  Similar appearance to priors.  Scattered air-fluid levels without dilated loops of small or large bowel.  No obstruction or free air.  Vascular calcification.  IMPRESSION: Possible mild ileus.  Chronic right pleural effusion.  No  obstruction or free air.  No active infiltrates or failure.   Original Report Authenticated By: Davonna Belling, M.D.     Anti-infectives: Anti-infectives     Start     Dose/Rate Route Frequency Ordered Stop   01/18/12 0800   ciprofloxacin (CIPRO) IVPB 400 mg        400 mg 200 mL/hr over 60 Minutes Intravenous Every 12 hours 01/17/12 2351     01/18/12 0600   metroNIDAZOLE (FLAGYL) IVPB 500 mg        500 mg 100 mL/hr over 60 Minutes Intravenous Every 8 hours 01/17/12 2351     01/17/12 1915   ciprofloxacin (CIPRO) IVPB 400 mg        400 mg 200 mL/hr over 60 Minutes Intravenous  Once 01/17/12 1914 01/17/12 2156   01/17/12 1915   metroNIDAZOLE (FLAGYL) IVPB 500 mg        500 mg 100 mL/hr over 60 Minutes Intravenous  Once 01/17/12  1914 01/17/12 2033           Assessment/Plan 1. Acute cholecystitis: pt will need to be off his xarelto until tomorrow, keep on heparin gtt until 2-3 hours before surgery, may have clear liquids today but NPO after midnight.    LOS: 1 day    Mattis Featherly 01/18/2012

## 2012-01-18 NOTE — Progress Notes (Signed)
Night Floor MD notified via text page to make aware of HR 49. Currently, pt HR 54 and tele running SB. Night RN made aware of HR and page to MD. Maple Mirza

## 2012-01-18 NOTE — Progress Notes (Signed)
ANTICOAGULATION CONSULT NOTE - Follow Up Consult  Pharmacy Consult for Heparin Indication: Recent DVT (8/13), bridge while Xarelto on hold  Allergies  Allergen Reactions  . Lyrica (Pregabalin) Hives and Itching    Patient Measurements: Height: 5\' 9"  (175.3 cm) Weight: 187 lb 9.8 oz (85.1 kg) IBW/kg (Calculated) : 70.7  Heparin Dosing Weight:   Vital Signs: Temp: 97.7 F (36.5 C) (01/09 1637) BP: 125/63 mmHg (01/09 1637) Pulse Rate: 49  (01/09 1637)  Labs:  Basename 01/18/12 2009 01/18/12 0917 01/18/12 0500 01/18/12 0005 01/17/12 1645  HGB -- -- 12.1* -- 14.5  HCT -- -- 36.7* -- 43.6  PLT -- -- 147* -- 150  APTT -- -- -- 35 --  LABPROT -- -- -- -- 14.3  INR -- -- -- -- 1.13  HEPARINUNFRC 0.19* 0.24* -- <0.10* --  CREATININE -- -- 1.01 -- 1.10  CKTOTAL -- -- -- -- --  CKMB -- -- -- -- --  TROPONINI -- -- -- -- --    Estimated Creatinine Clearance: 67.3 ml/min (by C-G formula based on Cr of 1.01).   Medications:  Scheduled:    . [COMPLETED] sodium chloride   Intravenous STAT  . beta carotene w/minerals  1 tablet Oral Daily  . [COMPLETED] ciprofloxacin  400 mg Intravenous Once  . ciprofloxacin  400 mg Intravenous Q12H  . [COMPLETED] heparin  1,000 Units Intravenous Once  . [COMPLETED] heparin  4,500 Units Intravenous Once  . metoprolol tartrate  25 mg Oral BID  . metronidazole  500 mg Intravenous Q8H  . nicotine  14 mg Transdermal Daily  . [COMPLETED] potassium chloride  10 mEq Intravenous Q1 Hr x 4  . senna  2 tablet Oral BID  . sertraline  50 mg Oral q morning - 10a  . tiotropium  18 mcg Inhalation Daily  . [DISCONTINUED] heparin  4,500 Units Intravenous Once  . [DISCONTINUED] heparin  5,000 Units Subcutaneous Q8H    Assessment: 79yo male on Heparin bridge while Xarelto on hold.  Heparin level down to 0.19.  Pump is running at correct rate, 1600 units/hr.  Per d/w patient and RN, there have been no problems with IV.  Goal of Therapy:  Heparin level  0.3-0.7 units/ml Monitor platelets by anticoagulation protocol: Yes   Plan:  1.  Heparin 1500 units IV x 1, and inc drip to 1750 units/hr 2.  Check heparin level 6 hours  Marisue Humble, PharmD Clinical Pharmacist [facility]

## 2012-01-18 NOTE — Progress Notes (Signed)
Tech notified me that pt HR 49. On assessment, pt alert and oriented, asymptomatic. BP stable. Pt and wife at bedside report that pt heart rate is "normally low like that". Informed pt that if he did start to feel symptoms to let me know. Reminded him not to try and get out of bed without assist. Will continue to monitor. [name], [name]

## 2012-01-18 NOTE — Progress Notes (Signed)
[facility] PROGRESS NOTE  [name] ZOX:[id number] DOB: 1933/10/30 DOA: 01/17/2012 PCP: Letitia Libra, Ala Dach, MD  Brief History: Steve [name] is a 79 y.o. male who presents with c/o 3 day history of abdominal pain. Profound nausea and one episode of emesis. Pain is worst in the RUQ. Worse with eating especially a large meal, better without eating. Presented to the ED on 01/17/2012. In the ED patient was found to have acute cholecystitis.  Assessment/Plan: Acute cholecystitis Currently on Cipro/Flagyl and NPO. Management per General Surgery, Dr. Magnus Ivan. On heparin drip in anticipation of some sort of procedure (drainage vs surgical cholecystectomy).  A.Fib Rate controlled, continue metoprolol. Anticoagulated on heparin drip, transition to xeralto after the procedure.  Mesothelioma S/p VATS on 11/02/2011 and drain placement. Drain removed about 2 weeks ago per family. Has followed up with Dr. Myna Hidalgo on 11/29/2011 and had PET scan done which did not show any activity on the right side and no obvious metastatic disease. Conservative management was recommended.  History of DVT on 09/01/2011 in the right femoral vein, popliteal vein, and posterior tibial vein with Factor V Leiden mutation Anticoagulated on heparin drip, transition to xeralto after the procedure.  HTN  Continue metoprolol.  UTI  Very mild UTI, patient already on cipro.  Depression Stable, continue sertraline.  COPD Not active at this time. Continue home inhalers.  Hypokalemia Replace as needed.  Mildly elevated LFTs Likely due to acute cholecystitis.  Prophylaxis Heparin drip.  Code Status: Full code Family Communication: Wife at bedside. Disposition Plan: Pending.   Consultants:  Dr. Magnus Ivan, General Surgery  Procedures:  None  Antibiotics:  Ciprofloxacin 01/17/2012 >>  Metronidazole 01/17/2012 >>  HPI/Subjective: Denies any abdominal pain. No specific  concerns.  Objective: Filed Vitals:   01/17/12 2008 01/17/12 2208 01/17/12 2343 01/18/12 0434  BP:  135/66 120/64 124/63  Pulse:   68 60  Temp: 98.6 F (37 C)  99.3 F (37.4 C) 98.6 F (37 C)  TempSrc:   Oral Oral  Resp:  22 18 18   Height:   5\' 9"  (1.753 m)   Weight:   85.1 kg (187 lb 9.8 oz)   SpO2:   95% 97%    Intake/Output Summary (Last 24 hours) at 01/18/12 0753 Last data filed at 01/18/12 0600  Gross per 24 hour  Intake 508.73 ml  Output      0 ml  Net 508.73 ml   Filed Weights   01/17/12 1629 01/17/12 2343  Weight: 83.462 kg (184 lb) 85.1 kg (187 lb 9.8 oz)    Exam: Physical Exam: General: Awake, Oriented, No acute distress. HEENT: EOMI. Neck: Supple CV: S1 and S2 Lungs: Clear to ascultation bilaterally Abdomen: Soft, mild tenderness in the right upper quadrent, Nondistended, +bowel sounds. Ext: Good pulses. Trace edema.  Data Reviewed: Basic Metabolic Panel:  Lab 01/18/12 8119 01/17/12 1645  NA 135 138  K 3.1* 3.2*  CL 100 97  CO2 27 26  GLUCOSE 94 130*  BUN 22 22  CREATININE 1.01 1.10  CALCIUM 8.9 9.8  MG -- --  PHOS -- --   Liver Function Tests:  Lab 01/17/12 1645  AST 37  ALT 72*  ALKPHOS 151*  BILITOT 0.8  PROT 7.9  ALBUMIN 3.0*    Lab 01/17/12 1645  LIPASE 13  AMYLASE --   No results found for this basename: AMMONIA:5 in the last 168 hours CBC:  Lab 01/18/12 0500 01/17/12 1645  WBC 11.5* 14.1*  NEUTROABS -- 11.6*  HGB 12.1* 14.5  HCT 36.7* 43.6  MCV 92.4 93.2  PLT 147* 150   Cardiac Enzymes: No results found for this basename: CKTOTAL:5,CKMB:5,CKMBINDEX:5,TROPONINI:5 in the last 168 hours BNP (last 3 results)  Basename 06/17/11 0918  PROBNP 919.3*   CBG: No results found for this basename: GLUCAP:5 in the last 168 hours  No results found for this or any previous visit (from the past 240 hour(s)).   Studies: Ct Abdomen Pelvis Wo Contrast  01/17/2012  *RADIOLOGY REPORT*  Clinical Data: Right lower quadrant pain,  hematuria  CT ABDOMEN AND PELVIS WITHOUT CONTRAST  Technique:  Multidetector CT imaging of the abdomen and pelvis was performed following the standard protocol without intravenous contrast.  Comparison: 09/20/2011  Findings: There is small right pleural effusion with right base posteriorly atelectasis or infiltrate.  Sagittal images of the spine is stable compression fracture of the L1 vertebral body.  Enhanced liver shows no biliary ductal dilatation.  There is a distended gallbladder.  There is thickening of the gallbladder wall.  There is significant stranding of pericholecystic fat. Findings are consistent with acute cholecystitis.  No calcified gallstones are noted within gallbladder. The pancreas and spleen are unremarkable.  A tiny nodule left adrenal gland is stable. Right adrenal gland is unremarkable.  Stable probable cyst in the upper pole of the kidney.  Atherosclerotic calcifications of the abdominal aorta and the iliac arteries.  Unenhanced kidneys are symmetrical in size.  No nephrolithiasis.  No hydronephrosis or hydroureter.  No pericecal inflammation.  Normal appendix is partially visualized in axial image 63.  The urinary bladder is unremarkable.  Probable prior TURP.  Gas noted within rectum.  No distal colonic obstruction.  IMPRESSION:  1.  There is distended gallbladder with marked thickened wall and stranding of the surrounding fat.  Findings are consistent with acute cholecystitis.  Further evaluation with gallbladder ultrasound is recommended. 2.  No nephrolithiasis.  No hydronephrosis or hydroureter. 3.  Stable cyst in upper pole of the right kidney. 4.  No pericecal inflammation.  Normal appendix partially visualized. 5.  There is small right pleural effusion with right base posterior atelectasis or infiltrate.  Critical findings discussed Dr. Jeraldine Loots.   Original Report Authenticated By: Natasha Mead, M.D.    US Abdomen Complete  01/17/2012  *RADIOLOGY REPORT*  Clinical Data:  Abdominal  pain. Nausea and vomiting.  COMPLETE ABDOMINAL ULTRASOUND  Comparison:  CT performed earlier.  Findings:  Gallbladder:  There is tenderness over the gallbladder to sonographic interrogation.  The gallbladder wall is thickened and contains multiple stones.  Gallbladder wall thickness is increased at 4.5 mm.  There is slight pericholecystic fluid.  Common bile duct:  Normal measuring 2.6 mm.  Liver:  Diffuse echogenicity.  No focal lesions.  IVC:  Unable to visualize due to bowel gas.  Pancreas:  Unable to visualize due to bowel gas.  Spleen:  Normal measuring 8.3 cm  Right Kidney:  No hydronephrosis or renal mass.  No calcifications. Normal length 11.6 cm  Left Kidney:  As no hydronephrosis or renal mass.  No calcifications.  Normal length of 13.0 cm.  Abdominal aorta:  Sonographic suspected measurement of 4.4 cm appears spurious.  There is extensive bowel gas which obscures visualization.  The aorta was nonaneurysmal on CT scan and heavily calcified.  IMPRESSION: Findings consistent with cholelithiasis and acute cholecystitis. Good correlation with CT appearance.  Hepatic steatosis.   Original Report Authenticated By: Davonna Belling, M.D.    Dg Abd Acute W/chest  01/17/2012  *  RADIOLOGY REPORT*  Clinical Data: Lower abdominal pain radiates in the epigastric area.  No chest pain.  History of COPD  ACUTE ABDOMEN SERIES (ABDOMEN 2 VIEW & CHEST 1 VIEW)  Comparison: .  12/25/2011.  Findings: Right pleural effusion persists. Pleurx catheter has been removed.  Mild right basilar atelectasis.  Cardiomegaly with tortuous aorta.  No infiltrate, failure, or pneumothorax.  Similar appearance to priors.  Scattered air-fluid levels without dilated loops of small or large bowel.  No obstruction or free air.  Vascular calcification.  IMPRESSION: Possible mild ileus.  Chronic right pleural effusion.  No obstruction or free air.  No active infiltrates or failure.   Original Report Authenticated By: Davonna Belling, M.D.     Scheduled  Meds:   . sodium chloride   Intravenous STAT  . beta carotene w/minerals  1 tablet Oral Daily  . ciprofloxacin  400 mg Intravenous Q12H  . metoprolol tartrate  25 mg Oral BID  . metronidazole  500 mg Intravenous Q8H  . senna  2 tablet Oral BID  . sertraline  50 mg Oral q morning - 10a  . tiotropium  18 mcg Inhalation Daily   Continuous Infusions:   . heparin 1,450 Units/hr (01/18/12 1610)    Principal Problem:  *Acute cholecystitis Active Problems:  COPD (chronic obstructive pulmonary disease)  Depression  Hypertension  Atrial fibrillation  History of DVT (deep vein thrombosis)    Time spent: 25 mins    Adyan Palau A  [facility] Pager [phone]. If 7PM-7AM, please contact night-coverage at [email or web address], password [facility] 01/18/2012, 7:53 AM  LOS: 1 day

## 2012-01-18 NOTE — Consult Note (Signed)
Reason for Consult:Cholecysitits Referring Physician: Dr. Marinda Elk is an 79 y.o. male.  HPI: This is a 79 year old gentleman who presents with a two-day history of right upper quadrant abdominal pain, nausea, and vomiting. He has had no previous similar history. The pain is described as sharp and continuous in nature. It is moderate in intensity. His nausea and vomiting are now improved. He has significant comorbidities including a history of mesothelioma. He had a pleural catheter removed recently after a Vats procedure for a pleural effusion.  He reports he is doing well functionally and has minimal shortness of breath. He is on anticoagulation for a DVT.  Past Medical History  Diagnosis Date  . Prostate enlargement   . History of chicken pox     childhood  . GERD (gastroesophageal reflux disease)   . Arthritis     knee and back  . AAA (abdominal aortic aneurysm)     stress test 09/14/10 EPIC  . Anxiety   . Dysrhythmia     hx of atrial fib post op x 2 days   . COPD (chronic obstructive pulmonary disease)   . Shortness of breath     with exertion   . Recurrent upper respiratory infection (URI)     productive cough- white phlegm- no fever  . Sleep apnea      12/12 sleep study,SEVERE per study  Dr Delton Coombes- states doesnt wear machine on regular basis- setting BiPAP 9/9  . Hypertension     chest x ray 12/12 EPIC- repeated 06/06/11, EKG 11/12 EPIC  . DVT (deep venous thrombosis) 08/2011  . Pneumonia   . Bruises easily     takes Xarelto    Past Surgical History  Procedure Date  . Knee surgery 1957    left knee arthotomy  . Abdominal aortic aneurysm repair 11/14/2010    AAA Repair    Dr Arbie Cookey  . Transurethral resection of prostate 01/25/2011    TURP  . Incisional hernia repair 06/12/2011    Procedure: HERNIA REPAIR INCISIONAL;  Surgeon: Velora Heckler, MD;  Location: [facility] ORS;  Service: General;  Laterality: N/A;  Open Repair Ventral Incisional Hernia with Mesh  . Hernia  repair   . Video assisted thoracoscopy 11/02/2011    Procedure: VIDEO ASSISTED THORACOSCOPY;  Surgeon: Loreli Slot, MD;  Location: [facility] OR;  Service: Thoracic;  Laterality: Right;  . Pleural biopsy 11/02/2011    Procedure: PLEURAL BIOPSY;  Surgeon: Loreli Slot, MD;  Location: [facility] OR;  Service: Thoracic;  Laterality: Right;  Parietal and visceral biopsies  . Chest tube insertion 11/02/2011    Procedure: INSERTION PLEURAL DRAINAGE CATHETER;  Surgeon: Loreli Slot, MD;  Location: [facility] OR;  Service: Thoracic;  Laterality: Right;  . Decortication 11/02/2011    Procedure: DECORTICATION;  Surgeon: Loreli Slot, MD;  Location: [facility] OR;  Service: Thoracic;  Laterality: Right;  . Pleural effusion drainage 11/02/2011    Procedure: DRAINAGE OF PLEURAL EFFUSION;  Surgeon: Loreli Slot, MD;  Location: [facility] OR;  Service: Thoracic;  Laterality: Right;    Family History  Problem Relation Age of Onset  . Arthritis Mother   . Heart failure Mother   . Hypertension Mother   . Diabetes Mother   . Heart failure Daughter     deceased age 79  . Other Daughter     deceased age 73 MVA    Social History:  reports that he has been smoking Cigarettes.  He started smoking  about 2 months ago. He has a 30 pack-year smoking history. He has never used smokeless tobacco. He reports that he does not drink alcohol or use illicit drugs.  Allergies:  Allergies  Allergen Reactions  . Lyrica (Pregabalin) Hives and Itching    Medications: I have reviewed the patient's current medications.  Results for orders placed during the hospital encounter of 01/17/12 (from the past 48 hour(s))  CBC WITH DIFFERENTIAL     Status: Abnormal   Collection Time   01/17/12  4:45 PM      Component Value Range Comment   WBC 14.1 (*) 4.0 - 10.5 K/uL    RBC 4.68  4.22 - 5.81 MIL/uL    Hemoglobin 14.5  13.0 - 17.0 g/dL    HCT 16.1  09.6 - 04.5 %    MCV 93.2  78.0 - 100.0 fL    MCH 31.0  26.0 - 34.0 pg     MCHC 33.3  30.0 - 36.0 g/dL    RDW 40.9  81.1 - 91.4 %    Platelets 150  150 - 400 K/uL    Neutrophils Relative 82 (*) 43 - 77 %    Neutro Abs 11.6 (*) 1.7 - 7.7 K/uL    Lymphocytes Relative 10 (*) 12 - 46 %    Lymphs Abs 1.5  0.7 - 4.0 K/uL    Monocytes Relative 7  3 - 12 %    Monocytes Absolute 0.9  0.1 - 1.0 K/uL    Eosinophils Relative 1  0 - 5 %    Eosinophils Absolute 0.1  0.0 - 0.7 K/uL    Basophils Relative 0  0 - 1 %    Basophils Absolute 0.0  0.0 - 0.1 K/uL   COMPREHENSIVE METABOLIC PANEL     Status: Abnormal   Collection Time   01/17/12  4:45 PM      Component Value Range Comment   Sodium 138  135 - 145 mEq/L    Potassium 3.2 (*) 3.5 - 5.1 mEq/L    Chloride 97  96 - 112 mEq/L    CO2 26  19 - 32 mEq/L    Glucose, Bld 130 (*) 70 - 99 mg/dL    BUN 22  6 - 23 mg/dL    Creatinine, Ser 7.82  0.50 - 1.35 mg/dL    Calcium 9.8  8.4 - 95.6 mg/dL    Total Protein 7.9  6.0 - 8.3 g/dL    Albumin 3.0 (*) 3.5 - 5.2 g/dL    AST 37  0 - 37 U/L    ALT 72 (*) 0 - 53 U/L    Alkaline Phosphatase 151 (*) 39 - 117 U/L    Total Bilirubin 0.8  0.3 - 1.2 mg/dL    GFR calc non Af Amer 63 (*) >90 mL/min    GFR calc Af Amer 73 (*) >90 mL/min   LIPASE, BLOOD     Status: Normal   Collection Time   01/17/12  4:45 PM      Component Value Range Comment   Lipase 13  11 - 59 U/L   PROTIME-INR     Status: Normal   Collection Time   01/17/12  4:45 PM      Component Value Range Comment   Prothrombin Time 14.3  11.6 - 15.2 seconds    INR 1.13  0.00 - 1.49   URINALYSIS, ROUTINE W REFLEX MICROSCOPIC     Status: Abnormal   Collection Time  01/17/12  4:47 PM      Component Value Range Comment   Color, Urine ORANGE (*) YELLOW BIOCHEMICALS MAY BE AFFECTED BY COLOR   APPearance CLEAR  CLEAR    Specific Gravity, Urine 1.035 (*) 1.005 - 1.030    pH 5.0  5.0 - 8.0    Glucose, UA NEGATIVE  NEGATIVE mg/dL    Hgb urine dipstick SMALL (*) NEGATIVE    Bilirubin Urine MODERATE (*) NEGATIVE    Ketones, ur 15 (*)  NEGATIVE mg/dL    Protein, ur 657 (*) NEGATIVE mg/dL    Urobilinogen, UA 1.0  0.0 - 1.0 mg/dL    Nitrite POSITIVE (*) NEGATIVE    Leukocytes, UA SMALL (*) NEGATIVE   URINE MICROSCOPIC-ADD ON     Status: Abnormal   Collection Time   01/17/12  4:47 PM      Component Value Range Comment   Squamous Epithelial / LPF FEW (*) RARE    WBC, UA 3-6  <3 WBC/hpf    RBC / HPF 7-10  <3 RBC/hpf    Bacteria, UA FEW (*) RARE    Crystals CA OXALATE CRYSTALS (*) NEGATIVE    Urine-Other AMORPHOUS URATES/PHOSPHATES   MUCOUS PRESENT  HEPARIN LEVEL (UNFRACTIONATED)     Status: Abnormal   Collection Time   01/18/12 12:05 AM      Component Value Range Comment   Heparin Unfractionated <0.10 (*) 0.30 - 0.70 IU/mL   APTT     Status: Normal   Collection Time   01/18/12 12:05 AM      Component Value Range Comment   aPTT 35  24 - 37 seconds   CBC     Status: Abnormal   Collection Time   01/18/12  5:00 AM      Component Value Range Comment   WBC 11.5 (*) 4.0 - 10.5 K/uL    RBC 3.97 (*) 4.22 - 5.81 MIL/uL    Hemoglobin 12.1 (*) 13.0 - 17.0 g/dL    HCT 84.6 (*) 96.2 - 52.0 %    MCV 92.4  78.0 - 100.0 fL    MCH 30.5  26.0 - 34.0 pg    MCHC 33.0  30.0 - 36.0 g/dL    RDW 95.2  84.1 - 32.4 %    Platelets 147 (*) 150 - 400 K/uL     Ct Abdomen Pelvis Wo Contrast  01/17/2012  *RADIOLOGY REPORT*  Clinical Data: Right lower quadrant pain, hematuria  CT ABDOMEN AND PELVIS WITHOUT CONTRAST  Technique:  Multidetector CT imaging of the abdomen and pelvis was performed following the standard protocol without intravenous contrast.  Comparison: 09/20/2011  Findings: There is small right pleural effusion with right base posteriorly atelectasis or infiltrate.  Sagittal images of the spine is stable compression fracture of the L1 vertebral body.  Enhanced liver shows no biliary ductal dilatation.  There is a distended gallbladder.  There is thickening of the gallbladder wall.  There is significant stranding of pericholecystic fat.  Findings are consistent with acute cholecystitis.  No calcified gallstones are noted within gallbladder. The pancreas and spleen are unremarkable.  A tiny nodule left adrenal gland is stable. Right adrenal gland is unremarkable.  Stable probable cyst in the upper pole of the kidney.  Atherosclerotic calcifications of the abdominal aorta and the iliac arteries.  Unenhanced kidneys are symmetrical in size.  No nephrolithiasis.  No hydronephrosis or hydroureter.  No pericecal inflammation.  Normal appendix is partially visualized in axial image 63.  The urinary  bladder is unremarkable.  Probable prior TURP.  Gas noted within rectum.  No distal colonic obstruction.  IMPRESSION:  1.  There is distended gallbladder with marked thickened wall and stranding of the surrounding fat.  Findings are consistent with acute cholecystitis.  Further evaluation with gallbladder ultrasound is recommended. 2.  No nephrolithiasis.  No hydronephrosis or hydroureter. 3.  Stable cyst in upper pole of the right kidney. 4.  No pericecal inflammation.  Normal appendix partially visualized. 5.  There is small right pleural effusion with right base posterior atelectasis or infiltrate.  Critical findings discussed Dr. Jeraldine Loots.   Original Report Authenticated By: Natasha Mead, M.D.    US Abdomen Complete  01/17/2012  *RADIOLOGY REPORT*  Clinical Data:  Abdominal pain. Nausea and vomiting.  COMPLETE ABDOMINAL ULTRASOUND  Comparison:  CT performed earlier.  Findings:  Gallbladder:  There is tenderness over the gallbladder to sonographic interrogation.  The gallbladder wall is thickened and contains multiple stones.  Gallbladder wall thickness is increased at 4.5 mm.  There is slight pericholecystic fluid.  Common bile duct:  Normal measuring 2.6 mm.  Liver:  Diffuse echogenicity.  No focal lesions.  IVC:  Unable to visualize due to bowel gas.  Pancreas:  Unable to visualize due to bowel gas.  Spleen:  Normal measuring 8.3 cm  Right Kidney:  No  hydronephrosis or renal mass.  No calcifications. Normal length 11.6 cm  Left Kidney:  As no hydronephrosis or renal mass.  No calcifications.  Normal length of 13.0 cm.  Abdominal aorta:  Sonographic suspected measurement of 4.4 cm appears spurious.  There is extensive bowel gas which obscures visualization.  The aorta was nonaneurysmal on CT scan and heavily calcified.  IMPRESSION: Findings consistent with cholelithiasis and acute cholecystitis. Good correlation with CT appearance.  Hepatic steatosis.   Original Report Authenticated By: Davonna Belling, M.D.    Dg Abd Acute W/chest  01/17/2012  *RADIOLOGY REPORT*  Clinical Data: Lower abdominal pain radiates in the epigastric area.  No chest pain.  History of COPD  ACUTE ABDOMEN SERIES (ABDOMEN 2 VIEW & CHEST 1 VIEW)  Comparison: .  12/25/2011.  Findings: Right pleural effusion persists. Pleurx catheter has been removed.  Mild right basilar atelectasis.  Cardiomegaly with tortuous aorta.  No infiltrate, failure, or pneumothorax.  Similar appearance to priors.  Scattered air-fluid levels without dilated loops of small or large bowel.  No obstruction or free air.  Vascular calcification.  IMPRESSION: Possible mild ileus.  Chronic right pleural effusion.  No obstruction or free air.  No active infiltrates or failure.   Original Report Authenticated By: Davonna Belling, M.D.     Review of Systems  Constitutional: Positive for malaise/fatigue. Negative for fever and chills.  HENT: Negative.   Eyes: Negative.   Respiratory: Positive for cough and shortness of breath.   Cardiovascular: Negative.   Gastrointestinal: Positive for nausea, vomiting and abdominal pain.  Genitourinary: Negative.   Musculoskeletal: Negative.   Skin: Negative.   Neurological: Negative.   Endo/Heme/Allergies: Negative.   Psychiatric/Behavioral: Negative.    Blood pressure 124/63, pulse 60, temperature 98.6 F (37 C), temperature source Oral, resp. rate 18, height 5\' 9"  (1.753 m),  weight 187 lb 9.8 oz (85.1 kg), SpO2 97.00%. Physical Exam  Constitutional: He is oriented to person, place, and time. He appears well-developed and well-nourished. No distress.  HENT:  Head: Normocephalic and atraumatic.  Right Ear: External ear normal.  Left Ear: External ear normal.  Nose: Nose normal.  Mouth/Throat: Oropharynx  is clear and moist. No oropharyngeal exudate.  Eyes: Conjunctivae normal are normal. Pupils are equal, round, and reactive to light. No scleral icterus.  Neck: Normal range of motion. Neck supple. No tracheal deviation present.  Cardiovascular: Normal rate, regular rhythm, normal heart sounds and intact distal pulses.   No murmur heard. Respiratory: Effort normal and breath sounds normal. No respiratory distress. He has no wheezes.  GI: Soft. He exhibits no distension. There is tenderness. There is guarding.       There is mild to moderate right upper quadrant tenderness with guarding  Musculoskeletal: Normal range of motion. He exhibits no edema and no tenderness.  Neurological: He is alert and oriented to person, place, and time.  Skin: Skin is warm and dry. No rash noted. He is not diaphoretic. No erythema.  Psychiatric: His behavior is normal. Judgment normal.    Assessment/Plan: Acute cholecystitis  Currently, his oral anticoagulation has been stopped and he is on IV heparin. We're to proceed from now this depends on his functional status. He did recently handle general anesthesia for his chest procedure.  I will discuss this with the primary team today. He will either need cholecystectomy or percutaneous cholecystostomy tube placement. I will keep him n.p.o. For now. IV antibiotics will be continued.  Eleanna Theilen A 01/18/2012, 6:26 AM

## 2012-01-19 ENCOUNTER — Encounter (HOSPITAL_COMMUNITY): Payer: Self-pay | Admitting: Anesthesiology

## 2012-01-19 ENCOUNTER — Other Ambulatory Visit: Payer: Self-pay | Admitting: *Deleted

## 2012-01-19 ENCOUNTER — Inpatient Hospital Stay (HOSPITAL_COMMUNITY): Payer: Medicare Other | Admitting: Anesthesiology

## 2012-01-19 ENCOUNTER — Encounter (HOSPITAL_COMMUNITY)
Admission: EM | Disposition: A | Discharge status: Home / Self Care | Payer: Self-pay | Source: Home / Self Care | Attending: Internal Medicine

## 2012-01-19 ENCOUNTER — Telehealth: Payer: Self-pay | Admitting: Internal Medicine

## 2012-01-19 ENCOUNTER — Inpatient Hospital Stay (HOSPITAL_COMMUNITY): Payer: Medicare Other

## 2012-01-19 DIAGNOSIS — J90 Pleural effusion, not elsewhere classified: Secondary | ICD-10-CM

## 2012-01-19 HISTORY — PX: CHOLECYSTECTOMY: SHX55

## 2012-01-19 LAB — CBC
MCH: 30.7 pg (ref 26.0–34.0)
MCV: 92 fL (ref 78.0–100.0)
Platelets: 139 10*3/uL — ABNORMAL LOW (ref 150–400)
RDW: 13.8 % (ref 11.5–15.5)
WBC: 7.6 10*3/uL (ref 4.0–10.5)

## 2012-01-19 LAB — COMPREHENSIVE METABOLIC PANEL
ALT: 33 U/L (ref 0–53)
AST: 18 U/L (ref 0–37)
CO2: 25 mEq/L (ref 19–32)
Calcium: 8.7 mg/dL (ref 8.4–10.5)
Chloride: 101 mEq/L (ref 96–112)
Creatinine, Ser: 0.77 mg/dL (ref 0.50–1.35)
GFR calc Af Amer: >90 mL/min (ref >90)
GFR calc non Af Amer: 86 mL/min — ABNORMAL LOW (ref >90)
Glucose, Bld: 91 mg/dL (ref 70–99)
Total Bilirubin: 0.7 mg/dL (ref 0.3–1.2)

## 2012-01-19 LAB — SURGICAL PCR SCREEN: MRSA, PCR: NEGATIVE

## 2012-01-19 SURGERY — LAPAROSCOPIC CHOLECYSTECTOMY WITH INTRAOPERATIVE CHOLANGIOGRAM
Anesthesia: General | Wound class: Contaminated

## 2012-01-19 MED ORDER — NEOSTIGMINE METHYLSULFATE 1 MG/ML IJ SOLN
INTRAMUSCULAR | Status: DC | PRN
  Administered 2012-01-19: 3 mg via INTRAVENOUS

## 2012-01-19 MED ORDER — HYDROMORPHONE HCL PF 1 MG/ML IJ SOLN
0.2500 mg | INTRAMUSCULAR | Status: DC | PRN
  Administered 2012-01-19 (×3): 0.25 mg via INTRAVENOUS

## 2012-01-19 MED ORDER — METOPROLOL TARTRATE 25 MG PO TABS: ORAL_TABLET | ORAL | Status: DC

## 2012-01-19 MED ORDER — BISACODYL 10 MG RE SUPP
10.0000 mg | Freq: Every day | RECTAL | Status: DC | PRN
  Filled 2012-01-19: qty 1

## 2012-01-19 MED ORDER — LABETALOL HCL 5 MG/ML IV SOLN
INTRAVENOUS | Status: DC | PRN
  Administered 2012-01-19: 5 mg via INTRAVENOUS

## 2012-01-19 MED ORDER — ONDANSETRON HCL 4 MG/2ML IJ SOLN
INTRAMUSCULAR | Status: AC
  Administered 2012-01-19: 4 mg via INTRAVENOUS
  Filled 2012-01-19: qty 2

## 2012-01-19 MED ORDER — LACTATED RINGERS IV SOLN
INTRAVENOUS | Status: DC | PRN
  Administered 2012-01-19 (×2): via INTRAVENOUS

## 2012-01-19 MED ORDER — EPHEDRINE SULFATE 50 MG/ML IJ SOLN
INTRAMUSCULAR | Status: DC | PRN
  Administered 2012-01-19 (×2): 5 mg via INTRAVENOUS

## 2012-01-19 MED ORDER — ONDANSETRON HCL 4 MG/2ML IJ SOLN
INTRAMUSCULAR | Status: DC | PRN
  Administered 2012-01-19: 4 mg via INTRAVENOUS

## 2012-01-19 MED ORDER — SODIUM CHLORIDE 0.9 % IR SOLN
Status: DC | PRN
  Administered 2012-01-19 (×2): 1000 mL

## 2012-01-19 MED ORDER — PHENYLEPHRINE HCL 10 MG/ML IJ SOLN
INTRAMUSCULAR | Status: DC | PRN
  Administered 2012-01-19 (×3): 80 ug via INTRAVENOUS

## 2012-01-19 MED ORDER — SODIUM CHLORIDE 0.9 % IV SOLN
INTRAVENOUS | Status: DC | PRN
  Administered 2012-01-19: 15:00:00

## 2012-01-19 MED ORDER — ONDANSETRON HCL 4 MG/2ML IJ SOLN
4.0000 mg | Freq: Once | INTRAMUSCULAR | Status: AC | PRN
  Administered 2012-01-19: 4 mg via INTRAVENOUS

## 2012-01-19 MED ORDER — MORPHINE SULFATE 2 MG/ML IJ SOLN
2.0000 mg | INTRAMUSCULAR | Status: DC | PRN
  Administered 2012-01-19: 2 mg via INTRAVENOUS
  Administered 2012-01-19 – 2012-01-20 (×3): 4 mg via INTRAVENOUS
  Administered 2012-01-20 – 2012-01-21 (×3): 2 mg via INTRAVENOUS
  Administered 2012-01-21: 4 mg via INTRAVENOUS
  Administered 2012-01-21 (×2): 2 mg via INTRAVENOUS
  Administered 2012-01-22 – 2012-01-23 (×2): 4 mg via INTRAVENOUS
  Filled 2012-01-19: qty 2
  Filled 2012-01-19 (×2): qty 1
  Filled 2012-01-19 (×3): qty 2
  Filled 2012-01-19 (×3): qty 1
  Filled 2012-01-19 (×2): qty 2
  Filled 2012-01-19: qty 1

## 2012-01-19 MED ORDER — GLYCOPYRROLATE 0.2 MG/ML IJ SOLN
INTRAMUSCULAR | Status: DC | PRN
  Administered 2012-01-19: 0.4 mg via INTRAVENOUS

## 2012-01-19 MED ORDER — PROPOFOL 10 MG/ML IV BOLUS
INTRAVENOUS | Status: DC | PRN
  Administered 2012-01-19: 200 mg via INTRAVENOUS

## 2012-01-19 MED ORDER — LIDOCAINE HCL (CARDIAC) 20 MG/ML IV SOLN
INTRAVENOUS | Status: DC | PRN
  Administered 2012-01-19: 80 mg via INTRAVENOUS

## 2012-01-19 MED ORDER — FENTANYL CITRATE 0.05 MG/ML IJ SOLN
INTRAMUSCULAR | Status: DC | PRN
  Administered 2012-01-19 (×2): 100 ug via INTRAVENOUS
  Administered 2012-01-19: 50 ug via INTRAVENOUS
  Administered 2012-01-19: 100 ug via INTRAVENOUS
  Administered 2012-01-19 (×3): 50 ug via INTRAVENOUS

## 2012-01-19 MED ORDER — MIDAZOLAM HCL 5 MG/5ML IJ SOLN
INTRAMUSCULAR | Status: DC | PRN
  Administered 2012-01-19: 1 mg via INTRAVENOUS

## 2012-01-19 MED ORDER — BUPIVACAINE-EPINEPHRINE 0.25% -1:200000 IJ SOLN
INTRAMUSCULAR | Status: AC
  Filled 2012-01-19: qty 1

## 2012-01-19 MED ORDER — HYDROMORPHONE HCL PF 1 MG/ML IJ SOLN
INTRAMUSCULAR | Status: AC
  Administered 2012-01-19: 0.25 mg via INTRAVENOUS
  Filled 2012-01-19: qty 1

## 2012-01-19 MED ORDER — 0.9 % SODIUM CHLORIDE (POUR BTL) OPTIME
TOPICAL | Status: DC | PRN
  Administered 2012-01-19: 1000 mL

## 2012-01-19 MED ORDER — OXYCODONE HCL 5 MG PO TABS
5.0000 mg | ORAL_TABLET | ORAL | Status: DC | PRN
  Administered 2012-01-21: 10 mg via ORAL
  Administered 2012-01-23: 5 mg via ORAL
  Filled 2012-01-19: qty 1

## 2012-01-19 MED ORDER — LACTATED RINGERS IV SOLN
INTRAVENOUS | Status: DC
  Administered 2012-01-19: 11:00:00 via INTRAVENOUS

## 2012-01-19 MED ORDER — BUPIVACAINE-EPINEPHRINE 0.25% -1:200000 IJ SOLN
INTRAMUSCULAR | Status: DC | PRN
  Administered 2012-01-19: 9 mL

## 2012-01-19 MED ORDER — ALBUMIN HUMAN 5 % IV SOLN
INTRAVENOUS | Status: DC | PRN
  Administered 2012-01-19 (×2): via INTRAVENOUS

## 2012-01-19 MED ORDER — ROCURONIUM BROMIDE 100 MG/10ML IV SOLN
INTRAVENOUS | Status: DC | PRN
  Administered 2012-01-19 (×2): 10 mg via INTRAVENOUS
  Administered 2012-01-19: 30 mg via INTRAVENOUS

## 2012-01-19 SURGICAL SUPPLY — 59 items
ADH SKN CLS APL DERMABOND .7 (GAUZE/BANDAGES/DRESSINGS) ×1
APPLIER CLIP 5 13 M/L LIGAMAX5 (MISCELLANEOUS) ×2
APPLIER CLIP ROT 10 11.4 M/L (STAPLE)
APR CLP MED LRG 11.4X10 (STAPLE)
APR CLP MED LRG 5 ANG JAW (MISCELLANEOUS) ×1
BAG SPEC RTRVL LRG 6X4 10 (ENDOMECHANICALS) ×1
BLADE SURG ROTATE 9660 (MISCELLANEOUS) ×1 IMPLANT
CANISTER SUCTION 2500CC (MISCELLANEOUS) ×2 IMPLANT
CHLORAPREP W/TINT 26ML (MISCELLANEOUS) ×2 IMPLANT
CLIP APPLIE 5 13 M/L LIGAMAX5 (MISCELLANEOUS) ×1 IMPLANT
CLIP APPLIE ROT 10 11.4 M/L (STAPLE) IMPLANT
CLOTH BEACON ORANGE TIMEOUT ST (SAFETY) ×2 IMPLANT
COVER MAYO STAND STRL (DRAPES) ×2 IMPLANT
COVER SURGICAL LIGHT HANDLE (MISCELLANEOUS) ×2 IMPLANT
DECANTER SPIKE VIAL GLASS SM (MISCELLANEOUS) ×4 IMPLANT
DERMABOND ADVANCED (GAUZE/BANDAGES/DRESSINGS) ×1
DERMABOND ADVANCED .7 DNX12 (GAUZE/BANDAGES/DRESSINGS) ×1 IMPLANT
DRAIN CHANNEL 19F RND (DRAIN) ×1 IMPLANT
DRAPE C-ARM 42X72 X-RAY (DRAPES) ×2 IMPLANT
DRAPE UTILITY 15X26 W/TAPE STR (DRAPE) ×4 IMPLANT
ELECT REM PT RETURN 9FT ADLT (ELECTROSURGICAL) ×2
ELECTRODE REM PT RTRN 9FT ADLT (ELECTROSURGICAL) ×1 IMPLANT
EVACUATOR SILICONE 100CC (DRAIN) ×1 IMPLANT
FILTER SMOKE EVAC LAPAROSHD (FILTER) ×1 IMPLANT
GLOVE BIO SURGEON STRL SZ8 (GLOVE) ×2 IMPLANT
GLOVE BIOGEL PI IND STRL 6.5 (GLOVE) IMPLANT
GLOVE BIOGEL PI IND STRL 7.0 (GLOVE) IMPLANT
GLOVE BIOGEL PI IND STRL 8 (GLOVE) ×1 IMPLANT
GLOVE BIOGEL PI INDICATOR 6.5 (GLOVE) ×1
GLOVE BIOGEL PI INDICATOR 7.0 (GLOVE) ×2
GLOVE BIOGEL PI INDICATOR 8 (GLOVE) ×1
GLOVE SKINSENSE NS SZ7.0 (GLOVE) ×1
GLOVE SKINSENSE STRL SZ7.0 (GLOVE) IMPLANT
GLOVE SURG SS PI 7.0 STRL IVOR (GLOVE) ×1 IMPLANT
GLOVE SURG SS PI 7.5 STRL IVOR (GLOVE) ×2 IMPLANT
GOWN PREVENTION PLUS XLARGE (GOWN DISPOSABLE) ×3 IMPLANT
GOWN SRG XL XLNG 56XLVL 4 (GOWN DISPOSABLE) IMPLANT
GOWN STRL NON-REIN LRG LVL3 (GOWN DISPOSABLE) ×4 IMPLANT
GOWN STRL NON-REIN XL XLG LVL4 (GOWN DISPOSABLE) ×2
KIT BASIN OR (CUSTOM PROCEDURE TRAY) ×2 IMPLANT
KIT ROOM TURNOVER OR (KITS) ×2 IMPLANT
NS IRRIG 1000ML POUR BTL (IV SOLUTION) ×2 IMPLANT
PAD ARMBOARD 7.5X6 YLW CONV (MISCELLANEOUS) ×2 IMPLANT
POUCH SPECIMEN RETRIEVAL 10MM (ENDOMECHANICALS) ×2 IMPLANT
SCISSORS LAP 5X35 DISP (ENDOMECHANICALS) ×1 IMPLANT
SET CHOLANGIOGRAPH 5 50 .035 (SET/KITS/TRAYS/PACK) ×2 IMPLANT
SET IRRIG TUBING LAPAROSCOPIC (IRRIGATION / IRRIGATOR) ×2 IMPLANT
SPECIMEN JAR SMALL (MISCELLANEOUS) ×2 IMPLANT
SUT ETHILON 2 0 FS 18 (SUTURE) ×1 IMPLANT
SUT PDS AB 4-0 RB1 27 (SUTURE) ×2 IMPLANT
SUT PDS AB 4-0 SH 27 (SUTURE) ×2 IMPLANT
SUT VIC AB 4-0 PS2 27 (SUTURE) ×2 IMPLANT
TOWEL OR 17X24 6PK STRL BLUE (TOWEL DISPOSABLE) ×2 IMPLANT
TOWEL OR 17X26 10 PK STRL BLUE (TOWEL DISPOSABLE) ×2 IMPLANT
TRAY LAPAROSCOPIC (CUSTOM PROCEDURE TRAY) ×2 IMPLANT
TROCAR HASSON GELL 12X100 (TROCAR) ×2 IMPLANT
TROCAR Z-THREAD FIOS 11X100 BL (TROCAR) ×1 IMPLANT
TROCAR Z-THREAD FIOS 5X100MM (TROCAR) ×7 IMPLANT
WATER STERILE IRR 1000ML POUR (IV SOLUTION) IMPLANT

## 2012-01-19 NOTE — Anesthesia Preprocedure Evaluation (Addendum)
Anesthesia Evaluation  Patient identified by MRN, date of birth, ID band Patient awake    Reviewed: Allergy & Precautions, H&P , NPO status , Patient's Chart, lab work & pertinent test results  Airway Mallampati: I TM Distance: >3 FB Neck ROM: full    Dental   Pulmonary shortness of breath, with exertion and Long-Term Oxygen Therapy, sleep apnea , pneumonia -, resolved, COPD oxygen dependent, Recent URI , Resolved,  S/p VATS pleural effusion drainage 10/13         Cardiovascular Exercise Tolerance: Poor hypertension, + Peripheral Vascular Disease + dysrhythmias Atrial Fibrillation     Neuro/Psych PSYCHIATRIC DISORDERS Anxiety Depression    GI/Hepatic GERD-  Controlled and Medicated,  Endo/Other    Renal/GU      Musculoskeletal   Abdominal   Peds  Hematology   Anesthesia Other Findings   Reproductive/Obstetrics                          Anesthesia Physical Anesthesia Plan  ASA: III  Anesthesia Plan: General   Post-op Pain Management:    Induction: Intravenous  Airway Management Planned: Oral ETT  Additional Equipment:   Intra-op Plan:   Post-operative Plan: Extubation in OR and Possible Post-op intubation/ventilation  Informed Consent: I have reviewed the patients History and Physical, chart, labs and discussed the procedure including the risks, benefits and alternatives for the proposed anesthesia with the patient or authorized representative who has indicated his/her understanding and acceptance.     Plan Discussed with: CRNA, Anesthesiologist and Surgeon  Anesthesia Plan Comments:        Anesthesia Quick Evaluation

## 2012-01-19 NOTE — Transfer of Care (Signed)
Immediate Anesthesia Transfer of Care Note  Patient: Steve [name]  Procedure(s) Performed: Procedure(s) (LRB) with comments: LAPAROSCOPIC CHOLECYSTECTOMY WITH INTRAOPERATIVE CHOLANGIOGRAM (N/A) - laparoscopic cholecystectomy with intraoperative cholangiogram  Patient Location: PACU  Anesthesia Type:General  Level of Consciousness: awake, alert , oriented and patient cooperative  Airway & Oxygen Therapy: Patient Spontanous Breathing and Patient connected to nasal cannula oxygen  Post-op Assessment: Report given to PACU RN, Post -op Vital signs reviewed and stable and Patient moving all extremities  Post vital signs: Reviewed and stable  Complications: No apparent anesthesia complications

## 2012-01-19 NOTE — Telephone Encounter (Signed)
Please advise/SLS

## 2012-01-19 NOTE — Progress Notes (Signed)
Patient ID: Steve [name], male   DOB: 1933/03/26, 79 y.o.   MRN: [id number] Patient in the restroom.  Wife in the room.  She said Heparin drip was shut off at 0700am this morning.  She said his pain is well controlled.  All questions about surgery were answered.  Plan for surgery around midday.  Vinod Mikesell E 8:13 AM 01/19/2012

## 2012-01-19 NOTE — Telephone Encounter (Signed)
metroprolol 25 mg 1 tablet two times per day

## 2012-01-19 NOTE — Progress Notes (Signed)
Patient ID: Steve [name], male   DOB: 06/22/1933, 79 y.o.   MRN: [id number] I spoke to Dr. Christella Hartigan from [facility] and he will see in AM for consideration of ERCP. Violeta Gelinas, MD, MPH, FACS Pager: [phone]

## 2012-01-19 NOTE — Telephone Encounter (Signed)
#  [phone] [id number]

## 2012-01-19 NOTE — Op Note (Addendum)
01/17/2012 - 01/19/2012  3:50 PM  PATIENT:  Steve [name]  79 y.o. male  PRE-OPERATIVE DIAGNOSIS:  cholecystitis  POST-OPERATIVE DIAGNOSIS:  cholecystitis  PROCEDURE:  Procedure(s): LAPAROSCOPIC CHOLECYSTECTOMY WITH INTRAOPERATIVE CHOLANGIOGRAM LAPAROSCOPIC REPAIR COMMON HEPATIC DUCT  SURGEON:  Surgeon(s): Liz Malady, MD Lodema Pilot, DO  PHYSICIAN ASSISTANT:   ASSISTANTS: Lodema Pilot, DO   ANESTHESIA:   local and general  EBL:  Total I/O In: 1500 [I.V.:1000; IV Piggyback:500] Out: -   BLOOD ADMINISTERED:none  DRAINS: (1) Jackson-Pratt drain(s) with closed bulb suction in the RUQ   SPECIMEN:  Excision  DISPOSITION OF SPECIMEN:  PATHOLOGY  COUNTS:  YES  COMPLICATION:  Pinhole injury common hepatic duct, repaired lapaorscopically with normal cholangiogram  DICTATION: .Dragon Dictation  Patient was identified in the pre-op holding area. Informed consent was obtained. He is receiving IV antibiotics. He was brought to the operating room and general endotracheal anesthesia was administered by the anesthesia staff. Abdomen was prepped and draped in sterile fashion. Time out procedure was done. Infraumbilical incision infiltrating local.  Subcutaneous tissues were dissected down revealing the anterior fascia. This was divided under direct vision. Peritoneal Cavity was carefully entered under direct vision as well due to previous Hernia repair with mesh. 0 Vicryl pursestring suture was placed on the fascial opening. Hassan trocar was inserted. Abdomen was insufflated with carbon dioxide in standard fashion. Laparoscopic exploration revealed some adhesions up to his mesh along the midline we stayed away from. 25 mm ports were placed in the right lateral abdomen under direct vision. Local was used at these port sites. Next, the camera was moved over 20s lateral port sites to visualize the subxiphoid area better. A 5 mm port was placed there under direct vision. Gallbladder was  enlarged and tensely distended. It was aspirated with the Nahzat drainage system. The dome the gallbladder was then retracted superior medially. The body and infundibulum were densely adhered by omentum. This was gradually and carefully dissected down. An additional 5 mm port was placed to assist in this difficult dissection.As it swept down we visualized the infundibulum and this was retracted inferior laterally. Dissection began laterally and progressed medially. The omentum was further swept down. Further dissection next the infundibulum identified the cystic duct. The cystic artery was passing under the cystic duct. There was a lot of chronic dense inflammation. The cystic artery was somewhat in the way and it was dissected near the area where it entered the gallbladder. It was clipped twice proximally once distally and divided. At this time the small amount of bile staining below the cystic duct. Close inspection in that area revealed a small pinhole in the common hepatic duct.  This must have arose from sweeping down the densely adherent omentum. Attention was redirected to the cystic duct and this was completely dissected clearly delineating the anatomy. Decision was made to repair this tiny pinhole. This was repaired with a 4-0 PDS and tied down securely.  There was a question of further bile drainage from that region but as he was running down from the drain site in the gallbladder, this was not clear. Next we proceeded with cholangiogram. A clip was placed on the infundibular cystic duct junction. Small nick was made the cystic duct. Cholangiocatheter was inserted. Intraoperative cholangiogram revealed no common bile duct filling defects, no contrast extravasation from the common hepatic duct or else where. Normal caliber of biliary tract.  I reviewed this with Dr. Vernard Gambles from radiology. Next cholangiocatheter was removed and 3 clips  were placed proximally on the cystic duct. It was divided. Gallbladder  was then taken off the liver bed carefully with Bovie cautery. Again dense chronic inflammation was noted. Gallbladder was placed in Endo Catch bag and removed from the abdomen via the infraumbilical port site. Gallbladder bed was copiously irrigated. Irrigation fluid returned clear. Are stitch and clips remain in good position. A 19 [name] Blake drain was placed in the right upper quadrant exiting the lateral port site on the right side. This was secured in position with nylon suture. Irrigation fluid was evacuated and it returned clear. Quadrant inspection was limited due to the adhesions from the mesh but no other complaint features were noted.Ports were then removed under direct vision. Infraumbilical fascia was closed with 2-0 Vicryl pursestring suture carefully. For remaining wounds were irrigated and the skin of each was closed with 4 Vicryl followed by Dermabond. All counts were correct. Patient tolerated the procedure well. He was taken recovery room in stable condition. We will plan gastroenterology consultation for consideration of ERCP and stent to extend past the common hepatic duct repair.  PATIENT DISPOSITION:  PACU - hemodynamically stable.   Delay start of Pharmacological VTE agent (>24hrs) due to surgical blood loss or risk of bleeding:  no  Violeta Gelinas, MD, MPH, FACS Pager: [phone]  1/10/20143:50 PM

## 2012-01-19 NOTE — Progress Notes (Signed)
[facility] PROGRESS NOTE  Steve [name] Steve [name]:[id number] DOB: [birth year] DOA: 01/17/2012 PCP: Letitia Libra, Ala Dach, MD  Brief History: Steve [name] is a 79 y.o. male who presents with c/o 3 day history of abdominal pain. Profound nausea and one episode of emesis. Pain is worst in the RUQ. Worse with eating especially a large meal, better without eating. Presented to the ED on 01/17/2012. In the ED patient was found to have acute cholecystitis.  Assessment/Plan: Acute cholecystitis Currently on Cipro/Flagyl and NPO. Management per General Surgery, Dr. Magnus Ivan. Plan for cholecystectomy today.  A.Fib Rate controlled, continue metoprolol. Anticoagulated on heparin drip, transition to xeralto after the procedure.  Mesothelioma S/p VATS on 11/02/2011 and drain placement. Drain removed about 2 weeks ago per family. Has followed up with Dr. Myna Hidalgo on 11/29/2011 and had PET scan done which did not show any activity on the right side and no obvious metastatic disease. Conservative management was recommended.  History of DVT on 09/01/2011 in the right femoral vein, popliteal vein, and posterior tibial vein with Factor V Leiden mutation Anticoagulated on heparin drip, transition to xeralto after the procedure.  HTN  Continue metoprolol.  UTI  Very mild UTI, urine culture showed insignificant growth.  Patient already on cipro.  Depression Stable, continue sertraline.  COPD Not active at this time. Continue home inhalers.  Hypokalemia Replace as needed.  Mildly elevated LFTs Likely due to acute cholecystitis.  Prophylaxis Heparin drip.  Code Status: Full code Family Communication: Wife at bedside. Disposition Plan: Pending.   Consultants:  Dr. Magnus Ivan, General Surgery  Procedures:  None  Antibiotics:  Ciprofloxacin 01/17/2012 >>  Metronidazole 01/17/2012 >>  HPI/Subjective: No specific concerns.  Denied any pain.  Objective: Filed Vitals:   01/18/12 1352  01/18/12 1637 01/18/12 2135 01/19/12 0453  BP: 134/54 125/63 146/64 157/66  Pulse: 49 49 57 56  Temp: 97.3 F (36.3 C) 97.7 F (36.5 C) 98.4 F (36.9 C) 97.9 F (36.6 C)  TempSrc:   Oral Oral  Resp: 17 18 18 18   Height:   5\' 9"  (1.753 m)   Weight:   85.1 kg (187 lb 9.8 oz)   SpO2: 97% 96% 97% 95%    Intake/Output Summary (Last 24 hours) at 01/19/12 0753 Last data filed at 01/19/12 0456  Gross per 24 hour  Intake    320 ml  Output   1400 ml  Net  -1080 ml   Filed Weights   01/17/12 1629 01/17/12 2343 01/18/12 2135  Weight: 83.462 kg (184 lb) 85.1 kg (187 lb 9.8 oz) 85.1 kg (187 lb 9.8 oz)    Exam: Physical Exam: General: Awake, Oriented, No acute distress. HEENT: EOMI. Neck: Supple CV: S1 and S2 Lungs: Clear to ascultation bilaterally Abdomen: Soft, mild tenderness in the right upper quadrent, Nondistended, +bowel sounds. Ext: Good pulses. Trace edema.  Data Reviewed: Basic Metabolic Panel:  Lab 01/19/12 0865 01/18/12 0500 01/17/12 1645  NA 136 135 138  K 3.5 3.1* 3.2*  CL 101 100 97  CO2 25 27 26   GLUCOSE 91 94 130*  BUN 14 22 22   CREATININE 0.77 1.01 1.10  CALCIUM 8.7 8.9 9.8  MG 2.0 -- --  PHOS -- -- --   Liver Function Tests:  Lab 01/19/12 0625 01/17/12 1645  AST 18 37  ALT 33 72*  ALKPHOS 128* 151*  BILITOT 0.7 0.8  PROT 6.6 7.9  ALBUMIN 2.5* 3.0*    Lab 01/17/12 1645  LIPASE 13  AMYLASE --  No results found for this basename: AMMONIA:5 in the last 168 hours CBC:  Lab 01/19/12 0625 01/18/12 0500 01/17/12 1645  WBC 7.6 11.5* 14.1*  NEUTROABS -- -- 11.6*  HGB 12.2* 12.1* 14.5  HCT 36.6* 36.7* 43.6  MCV 92.0 92.4 93.2  PLT 139* 147* 150   Cardiac Enzymes: No results found for this basename: CKTOTAL:5,CKMB:5,CKMBINDEX:5,TROPONINI:5 in the last 168 hours BNP (last 3 results)  Basename 06/17/11 0918  PROBNP 919.3*   CBG:  Lab 01/19/12 0734 01/18/12 0801  GLUCAP 86 95    Recent Results (from the past 240 hour(s))  URINE CULTURE      Status: Normal   Collection Time   01/17/12  4:47 PM      Component Value Range Status Comment   Specimen Description URINE, CLEAN CATCH   Final    Special Requests NONE   Final    Culture  Setup Time 01/17/2012 19:08   Final    Colony Count 2,000 COLONIES/ML   Final    Culture INSIGNIFICANT GROWTH   Final    Report Status 01/18/2012 FINAL   Final      Studies: Ct Abdomen Pelvis Wo Contrast  01/17/2012  *RADIOLOGY REPORT*  Clinical Data: Right lower quadrant pain, hematuria  CT ABDOMEN AND PELVIS WITHOUT CONTRAST  Technique:  Multidetector CT imaging of the abdomen and pelvis was performed following the standard protocol without intravenous contrast.  Comparison: 09/20/2011  Findings: There is small right pleural effusion with right base posteriorly atelectasis or infiltrate.  Sagittal images of the spine is stable compression fracture of the L1 vertebral body.  Enhanced liver shows no biliary ductal dilatation.  There is a distended gallbladder.  There is thickening of the gallbladder wall.  There is significant stranding of pericholecystic fat. Findings are consistent with acute cholecystitis.  No calcified gallstones are noted within gallbladder. The pancreas and spleen are unremarkable.  A tiny nodule left adrenal gland is stable. Right adrenal gland is unremarkable.  Stable probable cyst in the upper pole of the kidney.  Atherosclerotic calcifications of the abdominal aorta and the iliac arteries.  Unenhanced kidneys are symmetrical in size.  No nephrolithiasis.  No hydronephrosis or hydroureter.  No pericecal inflammation.  Normal appendix is partially visualized in axial image 63.  The urinary bladder is unremarkable.  Probable prior TURP.  Gas noted within rectum.  No distal colonic obstruction.  IMPRESSION:  1.  There is distended gallbladder with marked thickened wall and stranding of the surrounding fat.  Findings are consistent with acute cholecystitis.  Further evaluation with gallbladder  ultrasound is recommended. 2.  No nephrolithiasis.  No hydronephrosis or hydroureter. 3.  Stable cyst in upper pole of the right kidney. 4.  No pericecal inflammation.  Normal appendix partially visualized. 5.  There is small right pleural effusion with right base posterior atelectasis or infiltrate.  Critical findings discussed Dr. Jeraldine Loots.   Original Report Authenticated By: Natasha Mead, M.D.    US Abdomen Complete  01/17/2012  *RADIOLOGY REPORT*  Clinical Data:  Abdominal pain. Nausea and vomiting.  COMPLETE ABDOMINAL ULTRASOUND  Comparison:  CT performed earlier.  Findings:  Gallbladder:  There is tenderness over the gallbladder to sonographic interrogation.  The gallbladder wall is thickened and contains multiple stones.  Gallbladder wall thickness is increased at 4.5 mm.  There is slight pericholecystic fluid.  Common bile duct:  Normal measuring 2.6 mm.  Liver:  Diffuse echogenicity.  No focal lesions.  IVC:  Unable to visualize due to  bowel gas.  Pancreas:  Unable to visualize due to bowel gas.  Spleen:  Normal measuring 8.3 cm  Right Kidney:  No hydronephrosis or renal mass.  No calcifications. Normal length 11.6 cm  Left Kidney:  As no hydronephrosis or renal mass.  No calcifications.  Normal length of 13.0 cm.  Abdominal aorta:  Sonographic suspected measurement of 4.4 cm appears spurious.  There is extensive bowel gas which obscures visualization.  The aorta was nonaneurysmal on CT scan and heavily calcified.  IMPRESSION: Findings consistent with cholelithiasis and acute cholecystitis. Good correlation with CT appearance.  Hepatic steatosis.   Original Report Authenticated By: Davonna Belling, M.D.    Dg Abd Acute W/chest  01/17/2012  *RADIOLOGY REPORT*  Clinical Data: Lower abdominal pain radiates in the epigastric area.  No chest pain.  History of COPD  ACUTE ABDOMEN SERIES (ABDOMEN 2 VIEW & CHEST 1 VIEW)  Comparison: .  12/25/2011.  Findings: Right pleural effusion persists. Pleurx catheter has been  removed.  Mild right basilar atelectasis.  Cardiomegaly with tortuous aorta.  No infiltrate, failure, or pneumothorax.  Similar appearance to priors.  Scattered air-fluid levels without dilated loops of small or large bowel.  No obstruction or free air.  Vascular calcification.  IMPRESSION: Possible mild ileus.  Chronic right pleural effusion.  No obstruction or free air.  No active infiltrates or failure.   Original Report Authenticated By: Davonna Belling, M.D.     Scheduled Meds:    . beta carotene w/minerals  1 tablet Oral Daily  . ciprofloxacin  400 mg Intravenous Q12H  . metoprolol tartrate  25 mg Oral BID  . metronidazole  500 mg Intravenous Q8H  . nicotine  14 mg Transdermal Daily  . senna  2 tablet Oral BID  . sertraline  50 mg Oral q morning - 10a  . tiotropium  18 mcg Inhalation Daily   Continuous Infusions:   Principal Problem:  *Acute cholecystitis Active Problems:  COPD (chronic obstructive pulmonary disease)  Depression  Hypertension  Atrial fibrillation  History of DVT (deep vein thrombosis)  Hypokalemia   Tukker Byrns A  [facility] Pager [phone]. If 7PM-7AM, please contact night-coverage at [email or web address], password [email or web address] 01/19/2012, 7:53 AM  LOS: 2 days

## 2012-01-19 NOTE — Progress Notes (Signed)
Day of Surgery  Subjective: In pre-op holding  Objective: Vital signs in last 24 hours: Temp:  [97.3 F (36.3 C)-98.4 F (36.9 C)] 97.7 F (36.5 C) (01/10 0910) Pulse Rate:  [49-58] 58  (01/10 0910) Resp:  [17-18] 17  (01/10 0910) BP: (125-157)/(54-67) 152/67 mmHg (01/10 0910) SpO2:  [95 %-97 %] 95 % (01/10 0910) Weight:  [85.1 kg (187 lb 9.8 oz)] 85.1 kg (187 lb 9.8 oz) (01/09 2135) Last BM Date: 01/17/12  Intake/Output from previous day: 01/09 0701 - 01/10 0700 In: 320 [P.O.:320] Out: 1400 [Urine:1400] Intake/Output this shift:    General appearance: alert and cooperative Resp: clear to auscultation bilaterally GI: mild TTP RUQ  Lab Results:   Basename 01/19/12 0625 01/18/12 0500  WBC 7.6 11.5*  HGB 12.2* 12.1*  HCT 36.6* 36.7*  PLT 139* 147*   BMET  Basename 01/19/12 0625 01/18/12 0500  NA 136 135  K 3.5 3.1*  CL 101 100  CO2 25 27  GLUCOSE 91 94  BUN 14 22  CREATININE 0.77 1.01  CALCIUM 8.7 8.9   PT/INR  Basename 01/17/12 1645  LABPROT 14.3  INR 1.13   ABG No results found for this basename: PHART:2,PCO2:2,PO2:2,HCO3:2 in the last 72 hours  Studies/Results: Ct Abdomen Pelvis Wo Contrast  01/17/2012  *RADIOLOGY REPORT*  Clinical Data: Right lower quadrant pain, hematuria  CT ABDOMEN AND PELVIS WITHOUT CONTRAST  Technique:  Multidetector CT imaging of the abdomen and pelvis was performed following the standard protocol without intravenous contrast.  Comparison: 09/20/2011  Findings: There is small right pleural effusion with right base posteriorly atelectasis or infiltrate.  Sagittal images of the spine is stable compression fracture of the L1 vertebral body.  Enhanced liver shows no biliary ductal dilatation.  There is a distended gallbladder.  There is thickening of the gallbladder wall.  There is significant stranding of pericholecystic fat. Findings are consistent with acute cholecystitis.  No calcified gallstones are noted within gallbladder. The  pancreas and spleen are unremarkable.  A tiny nodule left adrenal gland is stable. Right adrenal gland is unremarkable.  Stable probable cyst in the upper pole of the kidney.  Atherosclerotic calcifications of the abdominal aorta and the iliac arteries.  Unenhanced kidneys are symmetrical in size.  No nephrolithiasis.  No hydronephrosis or hydroureter.  No pericecal inflammation.  Normal appendix is partially visualized in axial image 63.  The urinary bladder is unremarkable.  Probable prior TURP.  Gas noted within rectum.  No distal colonic obstruction.  IMPRESSION:  1.  There is distended gallbladder with marked thickened wall and stranding of the surrounding fat.  Findings are consistent with acute cholecystitis.  Further evaluation with gallbladder ultrasound is recommended. 2.  No nephrolithiasis.  No hydronephrosis or hydroureter. 3.  Stable cyst in upper pole of the right kidney. 4.  No pericecal inflammation.  Normal appendix partially visualized. 5.  There is small right pleural effusion with right base posterior atelectasis or infiltrate.  Critical findings discussed Dr. Jeraldine Loots.   Original Report Authenticated By: Natasha Mead, M.D.    US Abdomen Complete  01/17/2012  *RADIOLOGY REPORT*  Clinical Data:  Abdominal pain. Nausea and vomiting.  COMPLETE ABDOMINAL ULTRASOUND  Comparison:  CT performed earlier.  Findings:  Gallbladder:  There is tenderness over the gallbladder to sonographic interrogation.  The gallbladder wall is thickened and contains multiple stones.  Gallbladder wall thickness is increased at 4.5 mm.  There is slight pericholecystic fluid.  Common bile duct:  Normal measuring 2.6 mm.  Liver:  Diffuse echogenicity.  No focal lesions.  IVC:  Unable to visualize due to bowel gas.  Pancreas:  Unable to visualize due to bowel gas.  Spleen:  Normal measuring 8.3 cm  Right Kidney:  No hydronephrosis or renal mass.  No calcifications. Normal length 11.6 cm  Left Kidney:  As no hydronephrosis or  renal mass.  No calcifications.  Normal length of 13.0 cm.  Abdominal aorta:  Sonographic suspected measurement of 4.4 cm appears spurious.  There is extensive bowel gas which obscures visualization.  The aorta was nonaneurysmal on CT scan and heavily calcified.  IMPRESSION: Findings consistent with cholelithiasis and acute cholecystitis. Good correlation with CT appearance.  Hepatic steatosis.   Original Report Authenticated By: Davonna Belling, M.D.    Dg Abd Acute W/chest  01/17/2012  *RADIOLOGY REPORT*  Clinical Data: Lower abdominal pain radiates in the epigastric area.  No chest pain.  History of COPD  ACUTE ABDOMEN SERIES (ABDOMEN 2 VIEW & CHEST 1 VIEW)  Comparison: .  12/25/2011.  Findings: Right pleural effusion persists. Pleurx catheter has been removed.  Mild right basilar atelectasis.  Cardiomegaly with tortuous aorta.  No infiltrate, failure, or pneumothorax.  Similar appearance to priors.  Scattered air-fluid levels without dilated loops of small or large bowel.  No obstruction or free air.  Vascular calcification.  IMPRESSION: Possible mild ileus.  Chronic right pleural effusion.  No obstruction or free air.  No active infiltrates or failure.   Original Report Authenticated By: Davonna Belling, M.D.     Anti-infectives: Anti-infectives     Start     Dose/Rate Route Frequency Ordered Stop   01/18/12 0800   ciprofloxacin (CIPRO) IVPB 400 mg        400 mg 200 mL/hr over 60 Minutes Intravenous Every 12 hours 01/17/12 2351     01/18/12 0600   metroNIDAZOLE (FLAGYL) IVPB 500 mg        500 mg 100 mL/hr over 60 Minutes Intravenous Every 8 hours 01/17/12 2351     01/17/12 1915   ciprofloxacin (CIPRO) IVPB 400 mg        400 mg 200 mL/hr over 60 Minutes Intravenous  Once 01/17/12 1914 01/17/12 2156   01/17/12 1915   metroNIDAZOLE (FLAGYL) IVPB 500 mg        500 mg 100 mL/hr over 60 Minutes Intravenous  Once 01/17/12 1914 01/17/12 2033          Assessment/Plan: s/p Procedure(s) (LRB) with  comments: LAPAROSCOPIC CHOLECYSTECTOMY WITH INTRAOPERATIVE CHOLANGIOGRAM (N/A) - laparoscopic cholecystectomy with intraoperative cholangiogram for lap chole/IOC. Heparin held.  See note from 1/9. Consent obtained.  LOS: 2 days    Steve [name] 01/19/2012

## 2012-01-19 NOTE — Anesthesia Postprocedure Evaluation (Signed)
  Anesthesia Post-op Note  Patient: Steve [name]  Procedure(s) Performed: Procedure(s) (LRB) with comments: LAPAROSCOPIC CHOLECYSTECTOMY WITH INTRAOPERATIVE CHOLANGIOGRAM (N/A) - laparoscopic cholecystectomy with intraoperative cholangiogram  Patient Location: PACU  Anesthesia Type:General  Level of Consciousness: awake and sedated  Airway and Oxygen Therapy: Patient Spontanous Breathing  Post-op Pain: mild  Post-op Assessment: Post-op Vital signs reviewed, Patient's Cardiovascular Status Stable and Respiratory Function Stable  Post-op Vital Signs: stable  Complications: No apparent anesthesia complications

## 2012-01-19 NOTE — Telephone Encounter (Signed)
Rx to pharmacy/SLS 

## 2012-01-19 NOTE — Progress Notes (Signed)
See my progress note from today. Violeta Gelinas, MD, MPH, FACS Pager: [phone]

## 2012-01-20 ENCOUNTER — Encounter (HOSPITAL_COMMUNITY)
Admission: EM | Disposition: A | Discharge status: Home / Self Care | Payer: Self-pay | Source: Home / Self Care | Attending: Internal Medicine

## 2012-01-20 DIAGNOSIS — J449 Chronic obstructive pulmonary disease, unspecified: Secondary | ICD-10-CM

## 2012-01-20 DIAGNOSIS — S36129A Unspecified injury of gallbladder, initial encounter: Secondary | ICD-10-CM

## 2012-01-20 DIAGNOSIS — K819 Cholecystitis, unspecified: Secondary | ICD-10-CM

## 2012-01-20 DIAGNOSIS — Z86718 Personal history of other venous thrombosis and embolism: Secondary | ICD-10-CM

## 2012-01-20 DIAGNOSIS — J4489 Other specified chronic obstructive pulmonary disease: Secondary | ICD-10-CM

## 2012-01-20 DIAGNOSIS — I10 Essential (primary) hypertension: Secondary | ICD-10-CM

## 2012-01-20 LAB — COMPREHENSIVE METABOLIC PANEL
ALT: 27 U/L (ref 0–53)
Albumin: 2.8 g/dL — ABNORMAL LOW (ref 3.5–5.2)
Alkaline Phosphatase: 111 U/L (ref 39–117)
BUN: 13 mg/dL (ref 6–23)
Chloride: 101 mEq/L (ref 96–112)
Glucose, Bld: 115 mg/dL — ABNORMAL HIGH (ref 70–99)
Potassium: 3.9 mEq/L (ref 3.5–5.1)
Sodium: 137 mEq/L (ref 135–145)
Total Bilirubin: 0.6 mg/dL (ref 0.3–1.2)

## 2012-01-20 LAB — GLUCOSE, CAPILLARY: Glucose-Capillary: 107 mg/dL — ABNORMAL HIGH (ref 70–99)

## 2012-01-20 LAB — HEPARIN LEVEL (UNFRACTIONATED): Heparin Unfractionated: 0.17 IU/mL — ABNORMAL LOW (ref 0.30–0.70)

## 2012-01-20 SURGERY — ERCP, WITH INTERVENTION IF INDICATED
Anesthesia: Monitor Anesthesia Care

## 2012-01-20 MED ORDER — HEPARIN (PORCINE) IN NACL 100-0.45 UNIT/ML-% IJ SOLN
1750.0000 [IU]/h | INTRAMUSCULAR | Status: AC
  Administered 2012-01-21: 1750 [IU]/h via INTRAVENOUS
  Filled 2012-01-20: qty 250

## 2012-01-20 MED ORDER — SODIUM CHLORIDE 0.9 % IV SOLN
INTRAVENOUS | Status: DC
  Administered 2012-01-20: 16:00:00 via INTRAVENOUS

## 2012-01-20 MED ORDER — HEPARIN (PORCINE) IN NACL 100-0.45 UNIT/ML-% IJ SOLN
1500.0000 [IU]/h | INTRAMUSCULAR | Status: DC
  Administered 2012-01-20: 1500 [IU]/h via INTRAVENOUS
  Filled 2012-01-20: qty 250

## 2012-01-20 MED ORDER — HEPARIN BOLUS VIA INFUSION
4000.0000 [IU] | Freq: Once | INTRAVENOUS | Status: AC
  Administered 2012-01-20: 4000 [IU] via INTRAVENOUS
  Filled 2012-01-20: qty 4000

## 2012-01-20 NOTE — Progress Notes (Signed)
ANTICOAGULATION CONSULT NOTE - Follow Up Consult   HL = 0.17 (goal 0.3 - 0.7 units/mL) Heparin weight = 88 kg   Assessment: 89 YOM with history of DVT, lupus anticoagulant, factor V mutation, and Afib to continue on IV heparin while Xarelto on hold for surgery.  Heparin level sub-therapeutic.  No complications with infusion; no bleeding reported either.   Plan: - Increase heparin gtt to 1750 units/hr - Will not order level post rate change since heparin to stop at 0200 prior to ERCP per MD order - F/U post procedure     Zyliah Schier D. Laney Potash, PharmD, BCPS Pager:  [phone] 01/20/2012, 7:43 PM

## 2012-01-20 NOTE — Consult Note (Signed)
[facility] Consult: 8:40 AM 01/20/2012   Referring Provider:  Dr Violeta Gelinas Primary Care Physician:  Letitia Libra, Ala Dach, MD Primary Gastroenterologist:  none  Reason for Consultation:  Evaluate pt who had repair of common hepatic duct, evaluate for possible biliary stent.  HPI: Steve [name] is a 79 y.o. male. Many surgeries in 2013 outlined below. Has nocturnal O2 dependent COPD.  On Xarelto for hx DVT.  Admitted 1/8, Wednesday with 3 day hx of nausea, non-bloody vomiting, right abdominal pain.  Elevated ALT and Alk Phos.  Acute cholecystitis, cholelithiasis, echogenic liver, 2.6 mm CBD on the [place]. S/p 01/19/11 lap chole lysis of dense adhesions required to dissect out GB and repair of CHD.  During the dissection he noted "a small amount of bile staining below the cystic duct. Close inspection in that area revealed a small pinhole in the common hepatic duct."  This was repaired.  "Intraoperative cholangiogram revealed no common bile duct filling defects, no contrast extravasation from the common hepatic duct or else where. Normal caliber of biliary tract"  Pt is sore today, appetite still diminished but nausea resolved and pain is different than pre-op.   Alk Phos and transaminases are normal.  No fevers overnight.    ROS Pt "never went to see a doctor before I was 77".  Has never had EGD or colonoscopy.  No blood in stool, no change in bowel habits, though fewer stools during past few days.  No dysphagia or heartburn, no fatty food intolerance.  Uses nasa oxygen at night and prn during day. Unsteady gait, balance problems limit his walking.  No swelling in feet.  No chest pain Flu shot current. Smokes 1PPD.  No ETOh No hx liver problems.  No transfusions.       Past Medical History  Diagnosis Date  . Prostate enlargement   . History of chicken pox     childhood  . GERD (gastroesophageal reflux disease)   . Arthritis       knee and back  . AAA (abdominal aortic aneurysm)     stress test 09/14/10 EPIC  . Anxiety   . Dysrhythmia     hx of atrial fib post op x 2 days   . COPD (chronic obstructive pulmonary disease)   . Shortness of breath     with exertion   . Recurrent upper respiratory infection (URI)     productive cough- white phlegm- no fever  . Sleep apnea      12/12 sleep study,SEVERE per study  Dr Delton Coombes- states doesnt wear machine on regular basis- setting BiPAP 9/9  . Hypertension     chest x ray 12/12 EPIC- repeated 06/06/11, EKG 11/12 EPIC  . DVT (deep venous thrombosis) 08/2011  . Pneumonia   . Bruises easily     takes Xarelto    Past Surgical History  Procedure Date  . Knee surgery 1957    left knee arthotomy  . Abdominal aortic aneurysm repair 11/14/2010    AAA Repair    Dr Arbie Cookey  . Transurethral resection of prostate 01/25/2011    TURP  . Incisional hernia repair 06/12/2011    Procedure: HERNIA REPAIR INCISIONAL;  Surgeon: Velora Heckler, MD;  Location: [facility] ORS;  Service: General;  Laterality: N/A;  Open Repair Ventral Incisional Hernia with Mesh  . Hernia repair   . Video assisted thoracoscopy 11/02/2011    Procedure: VIDEO ASSISTED THORACOSCOPY;  Surgeon: Loreli Slot, MD;  Location: [facility] OR;  Service:  Thoracic;  Laterality: Right;  . Pleural biopsy 11/02/2011    Procedure: PLEURAL BIOPSY;  Surgeon: Loreli Slot, MD;  Location: [place] OR;  Service: Thoracic;  Laterality: Right;  Parietal and visceral biopsies  . Chest tube insertion 11/02/2011    Procedure: INSERTION PLEURAL DRAINAGE CATHETER;  Surgeon: Loreli Slot, MD;  Location: [facility] OR;  Service: Thoracic;  Laterality: Right;  . Decortication 11/02/2011    Procedure: DECORTICATION;  Surgeon: Loreli Slot, MD;  Location: [facility] OR;  Service: Thoracic;  Laterality: Right;  . Pleural effusion drainage 11/02/2011    Procedure: DRAINAGE OF PLEURAL EFFUSION;  Surgeon: Loreli Slot, MD;  Location: [facility] OR;   Service: Thoracic;  Laterality: Right;    Prior to Admission medications   Medication Sig Start Date End Date Taking? Authorizing Provider  albuterol (PROVENTIL HFA;VENTOLIN HFA) 108 (90 BASE) MCG/ACT inhaler Inhale 2 puffs into the lungs every 6 (six) hours as needed for wheezing. 06/20/11 06/19/12 Yes Simbiso Ranga, MD  ALPRAZolam (XANAX) 0.25 MG tablet Take 0.25 mg by mouth at bedtime.   Yes Historical Provider, MD  Rivaroxaban (XARELTO) 20 MG TABS Take 1 tablet (20 mg total) by mouth every morning. 11/01/11  Yes Josph Macho, MD  senna (SENOKOT) 8.6 MG TABS Take 2 tablets by mouth at bedtime.   Yes Historical Provider, MD  sertraline (ZOLOFT) 50 MG tablet Take 50 mg by mouth every morning.   Yes Historical Provider, MD  SPIRIVA HANDIHALER 18 MCG inhalation capsule INHALE ONE DOSE BY MOUTH EVERY DAY 12/14/11  Yes Leslye Peer, MD  metoprolol tartrate (LOPRESSOR) 25 MG tablet TAKE ONE TABLET BY MOUTH TWICE DAILY 01/19/12   Edwyna Perfect, MD    Scheduled Meds:    . beta carotene w/minerals  1 tablet Oral Daily  . ciprofloxacin  400 mg Intravenous Q12H  . metoprolol tartrate  25 mg Oral BID  . metronidazole  500 mg Intravenous Q8H  . nicotine  14 mg Transdermal Daily  . senna  2 tablet Oral BID  . sertraline  50 mg Oral q morning - 10a  . tiotropium  18 mcg Inhalation Daily   Infusions:    . lactated ringers 50 mL/hr at 01/19/12 1039   PRN Meds: albuterol, ALPRAZolam, morphine injection, ondansetron (ZOFRAN) IV, ondansetron, oxyCODONE   Allergies as of 01/17/2012 - Review Complete 01/17/2012  Allergen Reaction Noted  . Lyrica (pregabalin) Hives and Itching 09/14/2010    Family History  Problem Relation Age of Onset  . Arthritis Mother   . Heart failure Mother   . Hypertension Mother   . Diabetes Mother   . Heart failure Daughter     deceased age 42  . Other Daughter     deceased age 77 MVA    History   Social History  . Marital Status: Married    Spouse  Name: N/A    Number of Children: N/A  . Years of Education: N/A   Occupational History  . Retired from doing Production designer, theatre/television/film for [employer].   Social History Main Topics  . Smoking status: Current Every Day Smoker -- 0.5 packs/day for 60 years    Types: Cigarettes    Start date: 11/02/2011  . Smokeless tobacco: Never Used  . Alcohol Use: No  . Drug Use: No  . Sexually Active: Not on file    PHYSICAL EXAM: Vital signs in last 24 hours: Temp:  [97.5 F (36.4 C)-98 F (36.7 C)] 98 F (36.7 C) (01/11  0541) Pulse Rate:  [58-75] 68  (01/11 0541) Resp:  [16-17] 16  (01/11 0541) BP: (101-153)/(56-86) 101/56 mmHg (01/11 0541) SpO2:  [78 %-95 %] 94 % (01/11 0541) Weight:  [87.998 kg (194 lb)] 87.998 kg (194 lb) (01/10 2155)  General: chronically unwell appearing elderly WM who is comfortable and non toxic.  Head:  No swelling or asymmetry  Eyes:  No icterus or pallor Ears:  Not HOH  Nose:  No discharge or congestion.  Rhinophyma noted along with rosacea Mouth:  Dry but clear MM.  Fair dentition.  Neck:  No JVD or masses Lungs:  A few fine crackles in right base.  No cough or resp distress Heart: RRR.  No MRG Abdomen:  Soft, healing long midline incision.  Intact laparoscopic scars.  No blood or bile on bandages.   Rectal: none performed   Musc/Skeltl: no joint swelling or deformity.  Extremities:  No pedal edema  Neurologic:  Pleasant, oriented x3.  No tremor Skin:  No sores or rash Tattoos:  none Nodes:  No cervical adenopathy   Psych:  Pleasant, alert, cooperative, not agitated.   Intake/Output from previous day: 01/10 0701 - 01/11 0700 In: 2020 [P.O.:120; I.V.:1300; IV Piggyback:500] Out: 180 [Drains:180] Intake/Output this shift:    LAB RESULTS:  Basename 01/19/12 0625 01/18/12 0500 01/17/12 1645  WBC 7.6 11.5* 14.1*  HGB 12.2* 12.1* 14.5  HCT 36.6* 36.7* 43.6  PLT 139* 147* 150   BMET Lab Results  Component Value Date   NA 137 01/20/2012   NA 136  01/19/2012   NA 135 01/18/2012   K 3.9 01/20/2012   K 3.5 01/19/2012   K 3.1* 01/18/2012   CL 101 01/20/2012   CL 101 01/19/2012   CL 100 01/18/2012   CO2 23 01/20/2012   CO2 25 01/19/2012   CO2 27 01/18/2012   GLUCOSE 115* 01/20/2012   GLUCOSE 91 01/19/2012   GLUCOSE 94 01/18/2012   BUN 13 01/20/2012   BUN 14 01/19/2012   BUN 22 01/18/2012   CREATININE 0.83 01/20/2012   CREATININE 0.77 01/19/2012   CREATININE 1.01 01/18/2012   CALCIUM 8.8 01/20/2012   CALCIUM 8.7 01/19/2012   CALCIUM 8.9 01/18/2012   LFT  Basename 01/20/12 0625 01/19/12 0625 01/17/12 1645  PROT 6.7 6.6 7.9  ALBUMIN 2.8* 2.5* 3.0*  AST 26 18 37  ALT 27 33 72*  ALKPHOS 111 128* 151*  BILITOT 0.6 0.7 0.8  BILIDIR -- -- --  IBILI -- -- --   PT/INR Lab Results  Component Value Date   INR 1.13 01/17/2012   INR 1.21 10/31/2011   INR 1.03 09/01/2011    RADIOLOGY STUDIES: Dg Cholangiogram Operative 01/19/2012   *RADIOLOGY REPORT*  Clinical Data:   Laparoscopic cholecystectomy  INTRAOPERATIVE CHOLANGIOGRAM  Technique:  Cholangiographic images from the C-arm fluoroscopic device were submitted for interpretation post-operatively.  Please see the procedural report for the amount of contrast and the fluoroscopy time utilized.  Comparison:  None.  Findings:  Intraoperative cholangiogram demonstrates opacification of normal caliber common bile duct and intrahepatic ducts.  No extravasation of contrast.  No evidence for choledocholithiasis.  IMPRESSION:  1.  Normal postoperative cholangiogram status post cholecystectomy.   Original Report Authenticated By: Signa Kell, M.D.     ENDOSCOPIC STUDIES: None ever.   IMPRESSION: *  S/p 01/19/11 lap chole and repair of pinhole perf/tear in common hepatic duct. *  S/p 11/14/11 repair infrarenal AAA with Hemashield graft.  * s/p 06/2011 repair ventral incisional  hernia.  *  Thrombocytopenia, acutely.  *  Hx DVT.  On Xarelto PTA, then inpt heparin drip which is currently on hold  PLAN: *  Per Dr  Christella Hartigan:  Pt is set up in OR for ERCP with possible placement of biliary stent on 1/12. Needs to stay off Heparin for at least 5 hours before the ERCP Does not need additional abx pre procedure as he is on  Cipro and Flagyl already.    LOS: 3 days   Jennye Moccasin  01/20/2012, 8:40 AM Pager: [phone]      ________________________________________________________________________  [facility] MD note:  I personally examined the patient, reviewed the data and agree with the assessment and plan described above.  CHD injury during lap chole yesterday, repaired but this site is at risk for improper healing (leak, stricture formation) and I think ERCP with stent placement to decrease those risks is a good idea. Currently scheduled for ERCP tomorrow AM in OR.   Rob Bunting, MD [facility] Pager [phone]

## 2012-01-20 NOTE — Progress Notes (Signed)
[facility] PROGRESS NOTE  ABB GOBERT AVW:[id number] DOB: January 16, 1933 DOA: 01/17/2012 PCP: Letitia Libra, Ala Dach, MD  Brief History: Steve [name] is a 79 y.o. male who presents with c/o 3 day history of abdominal pain. Profound nausea and one episode of emesis. Pain is worst in the RUQ. Worse with eating especially a large meal, better without eating. Presented to the ED on 01/17/2012. In the ED patient was found to have acute cholecystitis.  Assessment/Plan: Acute cholecystitis s/p Laparoscopic cholecystectomy on 01/19/2012 Currently on Cipro/Flagyl and NPO. Management per General Surgery, Dr. Magnus Ivan. GI consulted for small pinhold in common hepatic duc that was repaired for possible stent placement. Discussed with GI and Surgery, ok to start heparin drip today and hold for atleast 5-6 hours prior to GI procedure tomorrow, also ok to start a diet and NPO after midnight.  A.Fib Rate controlled, continue metoprolol. Resume heparin drip as per surgery, transition to xeralto after the procedure.  Mesothelioma S/p VATS on 11/02/2011 and drain placement. Drain removed about 2 weeks ago per family. Has followed up with Dr. Myna Hidalgo on 11/29/2011 and had PET scan done which did not show any activity on the right side and no obvious metastatic disease. Conservative management was recommended.  History of DVT on 09/01/2011 in the right femoral vein, popliteal vein, and posterior tibial vein with Factor V Leiden mutation Anticoagulated on heparin drip, transition to xeralto after the procedure.  HTN  Continue metoprolol.  UTI  Very mild UTI, urine culture showed insignificant growth.  Patient already on cipro.  Depression Stable, continue sertraline.  COPD Not active at this time. Continue home inhalers.  Hypokalemia Replace as needed.  Mildly elevated LFTs Likely due to acute cholecystitis.  Prophylaxis Heparin drip.  Code Status: Full code Family Communication: Wife at  bedside. Disposition Plan: Pending.   Consultants:  Dr. Magnus Ivan, General Surgery  Dr. Christella Hartigan, GI  Procedures:  None  Antibiotics:  Ciprofloxacin 01/17/2012 >>  Metronidazole 01/17/2012 >>  HPI/Subjective: No specific concerns. Complaining of abdominal soreness.  Objective: Filed Vitals:   01/19/12 1800 01/19/12 1826 01/19/12 2155 01/20/12 0541  BP:  153/65 120/70 101/56  Pulse: 69 70 64 68  Temp:  97.6 F (36.4 C) 97.5 F (36.4 C) 98 F (36.7 C)  TempSrc:  Oral Oral Oral  Resp:   16 16  Height:      Weight:   87.998 kg (194 lb)   SpO2: 92% 94% 94% 94%    Intake/Output Summary (Last 24 hours) at 01/20/12 0941 Last data filed at 01/20/12 0300  Gross per 24 hour  Intake   2020 ml  Output    180 ml  Net   1840 ml   Filed Weights   01/17/12 2343 01/18/12 2135 01/19/12 2155  Weight: 85.1 kg (187 lb 9.8 oz) 85.1 kg (187 lb 9.8 oz) 87.998 kg (194 lb)    Exam: Physical Exam: General: Awake, Oriented, No acute distress. HEENT: EOMI. Neck: Supple CV: S1 and S2 Lungs: Clear to ascultation bilaterally Abdomen: Soft, mild tenderness in the right upper quadrent, Nondistended, +bowel sounds. Ext: Good pulses. Trace edema.  Data Reviewed: Basic Metabolic Panel:  Lab 01/20/12 8295 01/19/12 0625 01/18/12 0500 01/17/12 1645  NA 137 136 135 138  K 3.9 3.5 3.1* 3.2*  CL 101 101 100 97  CO2 23 25 27 26   GLUCOSE 115* 91 94 130*  BUN 13 14 22 22   CREATININE 0.83 0.77 1.01 1.10  CALCIUM 8.8 8.7 8.9 9.8  MG -- 2.0 -- --  PHOS -- -- -- --   Liver Function Tests:  Lab 01/20/12 0625 01/19/12 0625 01/17/12 1645  AST 26 18 37  ALT 27 33 72*  ALKPHOS 111 128* 151*  BILITOT 0.6 0.7 0.8  PROT 6.7 6.6 7.9  ALBUMIN 2.8* 2.5* 3.0*    Lab 01/17/12 1645  LIPASE 13  AMYLASE --   No results found for this basename: AMMONIA:5 in the last 168 hours CBC:  Lab 01/19/12 0625 01/18/12 0500 01/17/12 1645  WBC 7.6 11.5* 14.1*  NEUTROABS -- -- 11.6*  HGB 12.2* 12.1* 14.5    HCT 36.6* 36.7* 43.6  MCV 92.0 92.4 93.2  PLT 139* 147* 150   Cardiac Enzymes: No results found for this basename: CKTOTAL:5,CKMB:5,CKMBINDEX:5,TROPONINI:5 in the last 168 hours BNP (last 3 results)  Basename 06/17/11 0918  PROBNP 919.3*   CBG:  Lab 01/20/12 0742 01/19/12 0734 01/18/12 0801  GLUCAP 107* 86 95    Recent Results (from the past 240 hour(s))  URINE CULTURE     Status: Normal   Collection Time   01/17/12  4:47 PM      Component Value Range Status Comment   Specimen Description URINE, CLEAN CATCH   Final    Special Requests NONE   Final    Culture  Setup Time 01/17/2012 19:08   Final    Colony Count 2,000 COLONIES/ML   Final    Culture INSIGNIFICANT GROWTH   Final    Report Status 01/18/2012 FINAL   Final   SURGICAL PCR SCREEN     Status: Normal   Collection Time   01/19/12  6:44 AM      Component Value Range Status Comment   MRSA, PCR NEGATIVE  NEGATIVE Final    Staphylococcus aureus NEGATIVE  NEGATIVE Final      Studies: Dg Cholangiogram Operative  01/19/2012   *RADIOLOGY REPORT*  Clinical Data:   Laparoscopic cholecystectomy  INTRAOPERATIVE CHOLANGIOGRAM  Technique:  Cholangiographic images from the C-arm fluoroscopic device were submitted for interpretation post-operatively.  Please see the procedural report for the amount of contrast and the fluoroscopy time utilized.  Comparison:  None.  Findings:  Intraoperative cholangiogram demonstrates opacification of normal caliber common bile duct and intrahepatic ducts.  No extravasation of contrast.  No evidence for choledocholithiasis.  IMPRESSION:  1.  Normal postoperative cholangiogram status post cholecystectomy.   Original Report Authenticated By: Signa Kell, M.D.     Scheduled Meds:    . beta carotene w/minerals  1 tablet Oral Daily  . ciprofloxacin  400 mg Intravenous Q12H  . metoprolol tartrate  25 mg Oral BID  . metronidazole  500 mg Intravenous Q8H  . nicotine  14 mg Transdermal Daily  . senna  2  tablet Oral BID  . sertraline  50 mg Oral q morning - 10a  . tiotropium  18 mcg Inhalation Daily   Continuous Infusions:    . lactated ringers 50 mL/hr at 01/19/12 1039    Principal Problem:  *Acute cholecystitis Active Problems:  COPD (chronic obstructive pulmonary disease)  Depression  Hypertension  Atrial fibrillation  History of DVT (deep vein thrombosis)  Hypokalemia   Yarrow Linhart A  [facility] Pager [phone]. If 7PM-7AM, please contact night-coverage at [email or web address], password [facility] 01/20/2012, 9:41 AM  LOS: 3 days

## 2012-01-20 NOTE — Progress Notes (Signed)
Patient ID: Steve [name], male   DOB: Jun 13, 1933, 79 y.o.   MRN: [id number] 1 Day Post-Op  Subjective: Abd very sore this am, denies nausea/vomiting, no significant issues overnight  Objective: Vital signs in last 24 hours: Temp:  [97.5 F (36.4 C)-98 F (36.7 C)] 98 F (36.7 C) (01/11 0541) Pulse Rate:  [58-75] 68  (01/11 0541) Resp:  [16-17] 16  (01/11 0541) BP: (101-153)/(56-86) 101/56 mmHg (01/11 0541) SpO2:  [78 %-96 %] 94 % (01/11 0541) Weight:  [194 lb (87.998 kg)] 194 lb (87.998 kg) (01/10 2155) Last BM Date: 01/17/12  Intake/Output from previous day: 01/10 0701 - 01/11 0700 In: 2020 [P.O.:120; I.V.:1300; IV Piggyback:500] Out: 180 [Drains:180] Intake/Output this shift:    PE: Abd: relatively soft, +BS, incisions c/d/i Drain: 180cc overnight, bilious in appearance   Lab Results:   Basename 01/19/12 0625 01/18/12 0500  WBC 7.6 11.5*  HGB 12.2* 12.1*  HCT 36.6* 36.7*  PLT 139* 147*   BMET  Basename 01/19/12 0625 01/18/12 0500  NA 136 135  K 3.5 3.1*  CL 101 100  CO2 25 27  GLUCOSE 91 94  BUN 14 22  CREATININE 0.77 1.01  CALCIUM 8.7 8.9   PT/INR  Basename 01/17/12 1645  LABPROT 14.3  INR 1.13   CMP     Component Value Date/Time   NA 136 01/19/2012 0625   NA 143 01/09/2012 1100   K 3.5 01/19/2012 0625   K 3.9 01/09/2012 1100   CL 101 01/19/2012 0625   CL 103 01/09/2012 1100   CO2 25 01/19/2012 0625   CO2 31 01/09/2012 1100   GLUCOSE 91 01/19/2012 0625   GLUCOSE 94 01/09/2012 1100   BUN 14 01/19/2012 0625   BUN 18 01/09/2012 1100   CREATININE 0.77 01/19/2012 0625   CREATININE 1.3* 01/09/2012 1100   CALCIUM 8.7 01/19/2012 0625   CALCIUM 9.6 01/09/2012 1100   PROT 6.6 01/19/2012 0625   ALBUMIN 2.5* 01/19/2012 0625   AST 18 01/19/2012 0625   ALT 33 01/19/2012 0625   ALKPHOS 128* 01/19/2012 0625   BILITOT 0.7 01/19/2012 0625   GFRNONAA 86* 01/19/2012 0625   GFRAA >90 01/19/2012 0625   Lipase     Component Value Date/Time   LIPASE 13 01/17/2012 1645         Studies/Results: Dg Cholangiogram Operative  01/19/2012   *RADIOLOGY REPORT*  Clinical Data:   Laparoscopic cholecystectomy  INTRAOPERATIVE CHOLANGIOGRAM  Technique:  Cholangiographic images from the C-arm fluoroscopic device were submitted for interpretation post-operatively.  Please see the procedural report for the amount of contrast and the fluoroscopy time utilized.  Comparison:  None.  Findings:  Intraoperative cholangiogram demonstrates opacification of normal caliber common bile duct and intrahepatic ducts.  No extravasation of contrast.  No evidence for choledocholithiasis.  IMPRESSION:  1.  Normal postoperative cholangiogram status post cholecystectomy.   Original Report Authenticated By: Signa Kell, M.D.     Anti-infectives: Anti-infectives     Start     Dose/Rate Route Frequency Ordered Stop   01/18/12 0800   ciprofloxacin (CIPRO) IVPB 400 mg        400 mg 200 mL/hr over 60 Minutes Intravenous Every 12 hours 01/17/12 2351     01/18/12 0600   metroNIDAZOLE (FLAGYL) IVPB 500 mg        500 mg 100 mL/hr over 60 Minutes Intravenous Every 8 hours 01/17/12 2351     01/17/12 1915   ciprofloxacin (CIPRO) IVPB 400 mg  400 mg 200 mL/hr over 60 Minutes Intravenous  Once 01/17/12 1914 01/17/12 2156   01/17/12 1915   metroNIDAZOLE (FLAGYL) IVPB 500 mg        500 mg 100 mL/hr over 60 Minutes Intravenous  Once 01/17/12 1914 01/17/12 2033           Assessment/Plan 1. POD#1-lap chole: small pinhole in common hepatic duct that was repaired, drain with bilious appearing drainage, GI to see today to evaluate for ERCP and possible stent to relieve pressure in duct.  --pending GI consult can start diet if appropriate  --drain care  --Heparin gtt  --restart xarelto after all procedures finished.   LOS: 3 days    WHITE, ELIZABETH 01/20/2012

## 2012-01-20 NOTE — Progress Notes (Signed)
ANTICOAGULATION CONSULT NOTE - Initial Consult  Pharmacy Consult for heparin Indication: VTE treatment  Allergies  Allergen Reactions  . Lyrica (Pregabalin) Hives and Itching    Patient Measurements: Height: 5\' 9"  (175.3 cm) Weight: 194 lb (87.998 kg) IBW/kg (Calculated) : 70.7  Heparin Dosing Weight: 88 kg  Vital Signs: Temp: 98 F (36.7 C) (01/11 0541) Temp src: Oral (01/11 0541) BP: 101/56 mmHg (01/11 0541) Pulse Rate: 68  (01/11 0541)  Labs:  Basename 01/20/12 0625 01/19/12 0625 01/18/12 2009 01/18/12 0917 01/18/12 0500 01/18/12 0005 01/17/12 1645  HGB -- 12.2* -- -- 12.1* -- --  HCT -- 36.6* -- -- 36.7* -- 43.6  PLT -- 139* -- -- 147* -- 150  APTT -- -- -- -- -- 35 --  LABPROT -- -- -- -- -- -- 14.3  INR -- -- -- -- -- -- 1.13  HEPARINUNFRC -- -- 0.19* 0.24* -- <0.10* --  CREATININE 0.83 0.77 -- -- 1.01 -- --  CKTOTAL -- -- -- -- -- -- --  CKMB -- -- -- -- -- -- --  TROPONINI -- -- -- -- -- -- --    Estimated Creatinine Clearance: 83.1 ml/min (by C-G formula based on Cr of 0.83).   Medical History: Past Medical History  Diagnosis Date  . Prostate enlargement   . History of chicken pox     childhood  . GERD (gastroesophageal reflux disease)   . Arthritis     knee and back  . AAA (abdominal aortic aneurysm)     stress test 09/14/10 EPIC  . Anxiety   . Dysrhythmia     hx of atrial fib post op x 2 days   . COPD (chronic obstructive pulmonary disease)   . Shortness of breath     with exertion   . Recurrent upper respiratory infection (URI)     productive cough- white phlegm- no fever  . Sleep apnea      12/12 sleep study,SEVERE per study  Dr Delton Coombes- states doesnt wear machine on regular basis- setting BiPAP 9/9  . Hypertension     chest x ray 12/12 EPIC- repeated 06/06/11, EKG 11/12 EPIC  . DVT (deep venous thrombosis) 08/2011  . Pneumonia   . Bruises easily     takes Xarelto    Medications:  Prescriptions prior to admission  Medication Sig Dispense  Refill  . albuterol (PROVENTIL HFA;VENTOLIN HFA) 108 (90 BASE) MCG/ACT inhaler Inhale 2 puffs into the lungs every 6 (six) hours as needed for wheezing.  1 Inhaler  2  . ALPRAZolam (XANAX) 0.25 MG tablet Take 0.25 mg by mouth at bedtime.      . Rivaroxaban (XARELTO) 20 MG TABS Take 1 tablet (20 mg total) by mouth every morning.  30 tablet  3  . senna (SENOKOT) 8.6 MG TABS Take 2 tablets by mouth at bedtime.      . sertraline (ZOLOFT) 50 MG tablet Take 50 mg by mouth every morning.      Marland Kitchen SPIRIVA HANDIHALER 18 MCG inhalation capsule INHALE ONE DOSE BY MOUTH EVERY DAY  30 capsule  0    Assessment: Heparin for hx DVT/lupus AC positive/factor V mutation/Afib while Xarelto on hold for possible surgery plans. Last Xarelto dose on 1/7. Heparin before procedure, stopped at least 5 hr before, Xarelto after surgery.   No bleeding noted; CBC stable.    Goal of Therapy:  Heparin level 0.3-0.7 units/ml Monitor platelets by anticoagulation protocol: Yes   Plan:  - Heparin bolus 4000  units x1 - heparin drip at 1500 units/hr - 8 hr hep level  GI procedure 1/12 starting 0700-0900 range. Turn heparin off at 0200. Stop time in place F/u restarting Xarelto after procedure   Doris Cheadle, PharmD Clinical Pharmacist Pager: [phone] Phone: [phone] 01/20/2012 10:17 AM

## 2012-01-20 NOTE — Progress Notes (Signed)
General surgery attending note:  Patient interviewed and examined. Agree with assessment and treatment plan outlined bu Clance Boll, P.A.  The patient is stable and alert and in no distress. His abdomen is soft with a mild appropriate tenderness in the right upper quadrant. He has significant bilious drainage, 180 cc in 24 hours.  Will continue IV antibiotics. N.p.o. After midnight ERCP with biliary stent in hopes of resolving bile leak.   Steve [name]. Derrell Lolling, M.D., [facility] General and Minimally invasive Surgery Breast and Colorectal Surgery Office:   [phone] Pager:   [phone]

## 2012-01-21 ENCOUNTER — Inpatient Hospital Stay (HOSPITAL_COMMUNITY): Payer: Medicare Other

## 2012-01-21 ENCOUNTER — Encounter (HOSPITAL_COMMUNITY): Payer: Self-pay | Admitting: Anesthesiology

## 2012-01-21 ENCOUNTER — Encounter (HOSPITAL_COMMUNITY)
Admission: EM | Disposition: A | Discharge status: Home / Self Care | Payer: Self-pay | Source: Home / Self Care | Attending: Internal Medicine

## 2012-01-21 ENCOUNTER — Inpatient Hospital Stay (HOSPITAL_COMMUNITY): Payer: Medicare Other | Admitting: Anesthesiology

## 2012-01-21 DIAGNOSIS — S3613XA Injury of bile duct, initial encounter: Secondary | ICD-10-CM

## 2012-01-21 HISTORY — PX: ERCP: SHX5425

## 2012-01-21 LAB — CBC
HCT: 35.2 % — ABNORMAL LOW (ref 39.0–52.0)
Hemoglobin: 11.5 g/dL — ABNORMAL LOW (ref 13.0–17.0)
MCV: 94.6 fL (ref 78.0–100.0)
RDW: 14.1 % (ref 11.5–15.5)
WBC: 9.1 10*3/uL (ref 4.0–10.5)

## 2012-01-21 LAB — BASIC METABOLIC PANEL
BUN: 15 mg/dL (ref 6–23)
CO2: 24 mEq/L (ref 19–32)
Chloride: 102 mEq/L (ref 96–112)
Creatinine, Ser: 0.92 mg/dL (ref 0.50–1.35)
GFR calc Af Amer: >90 mL/min (ref >90)
Glucose, Bld: 88 mg/dL (ref 70–99)
Potassium: 3.8 mEq/L (ref 3.5–5.1)

## 2012-01-21 LAB — HEPARIN LEVEL (UNFRACTIONATED): Heparin Unfractionated: 0.24 IU/mL — ABNORMAL LOW (ref 0.30–0.70)

## 2012-01-21 SURGERY — ERCP, WITH INTERVENTION IF INDICATED
Anesthesia: General

## 2012-01-21 MED ORDER — SODIUM CHLORIDE 0.9 % IJ SOLN
INTRAMUSCULAR | Status: DC | PRN
  Administered 2012-01-21: 09:00:00

## 2012-01-21 MED ORDER — LIDOCAINE HCL 4 % MT SOLN
OROMUCOSAL | Status: DC | PRN
  Administered 2012-01-21: 4 mL via TOPICAL

## 2012-01-21 MED ORDER — ONDANSETRON HCL 4 MG/2ML IJ SOLN: 4.0000 mg | Freq: Once | INTRAMUSCULAR | Status: AC | PRN

## 2012-01-21 MED ORDER — HEPARIN (PORCINE) IN NACL 100-0.45 UNIT/ML-% IJ SOLN
1900.0000 [IU]/h | INTRAMUSCULAR | Status: AC
  Administered 2012-01-21: 1750 [IU]/h via INTRAVENOUS
  Administered 2012-01-21: 1900 [IU]/h via INTRAVENOUS
  Filled 2012-01-21 (×2): qty 250

## 2012-01-21 MED ORDER — OXYCODONE HCL 5 MG PO TABS
ORAL_TABLET | ORAL | Status: AC
  Filled 2012-01-21: qty 2

## 2012-01-21 MED ORDER — HYDROMORPHONE HCL PF 1 MG/ML IJ SOLN: 0.2500 mg | INTRAMUSCULAR | Status: DC | PRN

## 2012-01-21 MED ORDER — LACTATED RINGERS IV SOLN
INTRAVENOUS | Status: DC | PRN
  Administered 2012-01-21: 09:00:00 via INTRAVENOUS

## 2012-01-21 MED ORDER — ARTIFICIAL TEARS OP OINT
TOPICAL_OINTMENT | OPHTHALMIC | Status: DC | PRN
  Administered 2012-01-21: 1 via OPHTHALMIC

## 2012-01-21 MED ORDER — PHENYLEPHRINE HCL 10 MG/ML IJ SOLN
INTRAMUSCULAR | Status: DC | PRN
  Administered 2012-01-21 (×5): 40 ug via INTRAVENOUS

## 2012-01-21 MED ORDER — FENTANYL CITRATE 0.05 MG/ML IJ SOLN
INTRAMUSCULAR | Status: DC | PRN
  Administered 2012-01-21: 50 ug via INTRAVENOUS

## 2012-01-21 MED ORDER — PHENYLEPHRINE HCL 10 MG/ML IJ SOLN
10.0000 mg | INTRAVENOUS | Status: DC | PRN
  Administered 2012-01-21: 10 ug/min via INTRAVENOUS

## 2012-01-21 MED ORDER — LIDOCAINE HCL (CARDIAC) 20 MG/ML IV SOLN
INTRAVENOUS | Status: DC | PRN
  Administered 2012-01-21: 50 mg via INTRAVENOUS

## 2012-01-21 MED ORDER — ACETAMINOPHEN 10 MG/ML IV SOLN
1000.0000 mg | Freq: Once | INTRAVENOUS | Status: AC | PRN
  Filled 2012-01-21: qty 100

## 2012-01-21 MED ORDER — SODIUM CHLORIDE 0.9 % IV SOLN
INTRAVENOUS | Status: DC | PRN
  Administered 2012-01-21: 07:00:00 via INTRAVENOUS

## 2012-01-21 MED ORDER — EPHEDRINE SULFATE 50 MG/ML IJ SOLN
INTRAMUSCULAR | Status: DC | PRN
  Administered 2012-01-21: 5 mg via INTRAVENOUS
  Administered 2012-01-21 (×2): 10 mg via INTRAVENOUS
  Administered 2012-01-21: 5 mg via INTRAVENOUS

## 2012-01-21 MED ORDER — PROPOFOL 10 MG/ML IV BOLUS
INTRAVENOUS | Status: DC | PRN
  Administered 2012-01-21: 100 mg via INTRAVENOUS

## 2012-01-21 MED ORDER — SUCCINYLCHOLINE CHLORIDE 20 MG/ML IJ SOLN
INTRAMUSCULAR | Status: DC | PRN
  Administered 2012-01-21: 100 mg via INTRAVENOUS

## 2012-01-21 MED ORDER — DEXTROSE 5 % IV SOLN
INTRAVENOUS | Status: DC | PRN
  Administered 2012-01-21: 08:00:00 via INTRAVENOUS

## 2012-01-21 MED ORDER — HEPARIN BOLUS VIA INFUSION
4000.0000 [IU] | Freq: Once | INTRAVENOUS | Status: AC
  Administered 2012-01-21: 4000 [IU] via INTRAVENOUS
  Filled 2012-01-21: qty 4000

## 2012-01-21 NOTE — Anesthesia Preprocedure Evaluation (Addendum)
Anesthesia Evaluation  Patient identified by MRN, date of birth, ID band Patient awake    Reviewed: Allergy & Precautions, H&P , NPO status , Patient's Chart, lab work & pertinent test results, reviewed documented beta blocker date and time   Airway Mallampati: II TM Distance: >3 FB Neck ROM: Full    Dental  (+) Lower Dentures and Upper Dentures   Pulmonary shortness of breath, sleep apnea , COPD (wears 3L O2 at night) oxygen dependent, Recent URI , Current Smoker,  breath sounds clear to auscultation  + decreased breath sounds      Cardiovascular hypertension, + Peripheral Vascular Disease and DVT + dysrhythmias Atrial Fibrillation Rhythm:Regular Rate:Normal  Hx AAA s/p repair   Neuro/Psych PSYCHIATRIC DISORDERS Anxiety Depression    GI/Hepatic GERD-  ,  Endo/Other    Renal/GU      Musculoskeletal   Abdominal   Peds  Hematology   Anesthesia Other Findings   Reproductive/Obstetrics                        Anesthesia Physical Anesthesia Plan  ASA: IV  Anesthesia Plan: General   Post-op Pain Management:    Induction: Intravenous  Airway Management Planned: Oral ETT  Additional Equipment:   Intra-op Plan:   Post-operative Plan: Extubation in OR  Informed Consent: I have reviewed the patients History and Physical, chart, labs and discussed the procedure including the risks, benefits and alternatives for the proposed anesthesia with the patient or authorized representative who has indicated his/her understanding and acceptance.   Dental advisory given  Plan Discussed with: Anesthesiologist, Surgeon and CRNA  Anesthesia Plan Comments: (S/P Lap Cholecystectomy with bile leak Smoker/COPD on home O2 S/P AAA Repair 09/14/10 Sleep apnea on CPAP H/O DVT 08/29/11 on Xarelto S/P R. VATS with decortication for mesothelioma H/O Afib now in SR  Plan GA with oral ETT  Kipp Brood, MD  )        Anesthesia Quick Evaluation

## 2012-01-21 NOTE — Transfer of Care (Signed)
Immediate Anesthesia Transfer of Care Note  Patient: Steve [name]  Procedure(s) Performed: Procedure(s) (LRB) with comments: ENDOSCOPIC RETROGRADE CHOLANGIOPANCREATOGRAPHY (ERCP) (N/A)  Patient Location: PACU  Anesthesia Type:General  Level of Consciousness: sedated  Airway & Oxygen Therapy: Patient Spontanous Breathing and Patient connected to nasal cannula oxygen  Post-op Assessment: Report given to PACU RN and Post -op Vital signs reviewed and stable  Post vital signs: Reviewed and stable  Complications: No apparent anesthesia complications

## 2012-01-21 NOTE — Anesthesia Postprocedure Evaluation (Signed)
  Anesthesia Post-op Note  Patient: Steve [name]  Procedure(s) Performed: Procedure(s) (LRB) with comments: ENDOSCOPIC RETROGRADE CHOLANGIOPANCREATOGRAPHY (ERCP) (N/A)  Patient Location: PACU  Anesthesia Type:General  Level of Consciousness: awake, alert  and oriented  Airway and Oxygen Therapy: Patient Spontanous Breathing and Patient connected to nasal cannula oxygen  Post-op Pain: none  Post-op Assessment: Post-op Vital signs reviewed, Patient's Cardiovascular Status Stable, Respiratory Function Stable, Patent Airway and Pain level controlled  Post-op Vital Signs: stable  Complications: No apparent anesthesia complications

## 2012-01-21 NOTE — Progress Notes (Signed)
[facility] PROGRESS NOTE  Steve [name]:[id number] DOB: January 13, 1933 DOA: 01/17/2012 PCP: Letitia Libra, Ala Dach, MD  Brief History: Steve [name] is a 79 y.o. male who presents with c/o 3 day history of abdominal pain. Profound nausea and one episode of emesis. Pain is worst in the RUQ. Worse with eating especially a large meal, better without eating. Presented to the ED on 01/17/2012. In the ED patient was found to have acute cholecystitis.  Assessment/Plan: Acute cholecystitis s/p Laparoscopic cholecystectomy on 01/19/2012 Currently on Cipro/Flagyl. Management per General Surgery, Dr. Magnus Ivan. GI consulted for small pinhold in common hepatic duc that was repaired for possible stent placement.  GI attempted ERCP but was unsuccessful in placing a stent.  Restart heparin and n.p.o. after midnight for possible procedure tomorrow.  A.Fib Rate controlled, continue metoprolol. Resume heparin drip as per GI, transition to xeralto after the procedure.  Mesothelioma S/p VATS on 11/02/2011 and drain placement. Drain removed about 2 weeks ago per family. Has followed up with Dr. Myna Hidalgo on 11/29/2011 and had PET scan done which did not show any activity on the right side and no obvious metastatic disease. Conservative management was recommended.  History of DVT on 09/01/2011 in the right femoral vein, popliteal vein, and posterior tibial vein with Factor V Leiden mutation Anticoagulated on heparin drip, transition to xeralto after the procedure.  HTN  Continue metoprolol.  UTI  Very mild UTI, urine culture showed insignificant growth.  Patient already on cipro.  Depression Stable, continue sertraline.  COPD Not active at this time. Continue home inhalers.  Hypokalemia Replace as needed.  Mildly elevated LFTs Likely due to acute cholecystitis.  Prophylaxis Heparin drip.  Code Status: Full code Family Communication: Wife at bedside. Disposition Plan:  Pending.   Consultants:  Dr. Magnus Ivan, General Surgery  Dr. Christella Hartigan, GI  Procedures:  None  Antibiotics:  Ciprofloxacin 01/17/2012 >>  Metronidazole 01/17/2012 >>  HPI/Subjective: No specific concerns.  Just came back from ERCP.  Objective: Filed Vitals:   01/21/12 1015 01/21/12 1030 01/21/12 1053 01/21/12 1057  BP: 139/57  111/49   Pulse: 73  75   Temp: 98.6 F (37 C)  97.2 F (36.2 C)   TempSrc:   Oral   Resp:   18   Height:      Weight:      SpO2: 90% 90%  92%    Intake/Output Summary (Last 24 hours) at 01/21/12 1115 Last data filed at 01/21/12 1000  Gross per 24 hour  Intake    920 ml  Output    950 ml  Net    -30 ml   Filed Weights   01/18/12 2135 01/19/12 2155 01/20/12 2210  Weight: 85.1 kg (187 lb 9.8 oz) 87.998 kg (194 lb) 88.905 kg (196 lb)    Exam: Physical Exam: General: Awake, Oriented, No acute distress. HEENT: EOMI. Neck: Supple CV: S1 and S2 Lungs: Clear to ascultation bilaterally Abdomen: Soft, mild tenderness in the right upper quadrent, Nondistended, +bowel sounds. Ext: Good pulses. Trace edema.  Data Reviewed: Basic Metabolic Panel:  Lab 01/21/12 8119 01/20/12 0625 01/19/12 0625 01/18/12 0500 01/17/12 1645  NA 136 137 136 135 138  K 3.8 3.9 3.5 3.1* 3.2*  CL 102 101 101 100 97  CO2 24 23 25 27 26   GLUCOSE 88 115* 91 94 130*  BUN 15 13 14 22 22   CREATININE 0.92 0.83 0.77 1.01 1.10  CALCIUM 8.8 8.8 8.7 8.9 9.8  MG -- -- 2.0 -- --  PHOS -- -- -- -- --   Liver Function Tests:  Lab 01/20/12 0625 01/19/12 0625 01/17/12 1645  AST 26 18 37  ALT 27 33 72*  ALKPHOS 111 128* 151*  BILITOT 0.6 0.7 0.8  PROT 6.7 6.6 7.9  ALBUMIN 2.8* 2.5* 3.0*    Lab 01/17/12 1645  LIPASE 13  AMYLASE --   No results found for this basename: AMMONIA:5 in the last 168 hours CBC:  Lab 01/21/12 0626 01/19/12 0625 01/18/12 0500 01/17/12 1645  WBC 9.1 7.6 11.5* 14.1*  NEUTROABS -- -- -- 11.6*  HGB 11.5* 12.2* 12.1* 14.5  HCT 35.2* 36.6* 36.7*  43.6  MCV 94.6 92.0 92.4 93.2  PLT 146* 139* 147* 150   Cardiac Enzymes: No results found for this basename: CKTOTAL:5,CKMB:5,CKMBINDEX:5,TROPONINI:5 in the last 168 hours BNP (last 3 results)  Basename 06/17/11 0918  PROBNP 919.3*   CBG:  Lab 01/20/12 0742 01/19/12 0734 01/18/12 0801  GLUCAP 107* 86 95    Recent Results (from the past 240 hour(s))  URINE CULTURE     Status: Normal   Collection Time   01/17/12  4:47 PM      Component Value Range Status Comment   Specimen Description URINE, CLEAN CATCH   Final    Special Requests NONE   Final    Culture  Setup Time 01/17/2012 19:08   Final    Colony Count 2,000 COLONIES/ML   Final    Culture INSIGNIFICANT GROWTH   Final    Report Status 01/18/2012 FINAL   Final   SURGICAL PCR SCREEN     Status: Normal   Collection Time   01/19/12  6:44 AM      Component Value Range Status Comment   MRSA, PCR NEGATIVE  NEGATIVE Final    Staphylococcus aureus NEGATIVE  NEGATIVE Final      Studies: Dg Cholangiogram Operative  01/19/2012   *RADIOLOGY REPORT*  Clinical Data:   Laparoscopic cholecystectomy  INTRAOPERATIVE CHOLANGIOGRAM  Technique:  Cholangiographic images from the C-arm fluoroscopic device were submitted for interpretation post-operatively.  Please see the procedural report for the amount of contrast and the fluoroscopy time utilized.  Comparison:  None.  Findings:  Intraoperative cholangiogram demonstrates opacification of normal caliber common bile duct and intrahepatic ducts.  No extravasation of contrast.  No evidence for choledocholithiasis.  IMPRESSION:  1.  Normal postoperative cholangiogram status post cholecystectomy.   Original Report Authenticated By: Signa Kell, M.D.     Scheduled Meds:    . beta carotene w/minerals  1 tablet Oral Daily  . ciprofloxacin  400 mg Intravenous Q12H  . metoprolol tartrate  25 mg Oral BID  . metronidazole  500 mg Intravenous Q8H  . nicotine  14 mg Transdermal Daily  . oxyCODONE      .  senna  2 tablet Oral BID  . sertraline  50 mg Oral q morning - 10a  . tiotropium  18 mcg Inhalation Daily   Continuous Infusions:    . heparin 1,750 Units/hr (01/21/12 1110)  . lactated ringers 50 mL/hr at 01/19/12 1039    Principal Problem:  *Acute cholecystitis Active Problems:  COPD (chronic obstructive pulmonary disease)  Depression  Hypertension  Atrial fibrillation  History of DVT (deep vein thrombosis)  Hypokalemia  Bile duct injury   Khaleel Beckom A  [facility] Pager [phone]. If 7PM-7AM, please contact night-coverage at [email or web address], password [facility] 01/21/2012, 11:15 AM  LOS: 4 days

## 2012-01-21 NOTE — Anesthesia Procedure Notes (Signed)
Procedure Name: Intubation Date/Time: 01/21/2012 7:56 AM Performed by: Lovie Chol Pre-anesthesia Checklist: Patient identified, Emergency Drugs available, Suction available, Patient being monitored and Timeout performed Patient Re-evaluated:Patient Re-evaluated prior to inductionOxygen Delivery Method: Circle system utilized Preoxygenation: Pre-oxygenation with 100% oxygen Intubation Type: IV induction Ventilation: Mask ventilation without difficulty and Oral airway inserted - appropriate to patient size Laryngoscope Size: Miller and 2 Grade View: Grade I Tube type: Oral Tube size: 8.0 mm Number of attempts: 1 Airway Equipment and Method: Stylet and LTA kit utilized Placement Confirmation: ETT inserted through vocal cords under direct vision,  positive ETCO2,  CO2 detector and breath sounds checked- equal and bilateral Secured at: 22 cm Tube secured with: Tape Dental Injury: Teeth and Oropharynx as per pre-operative assessment

## 2012-01-21 NOTE — Progress Notes (Signed)
ANTICOAGULATION CONSULT NOTE - Follow Up Consult  Pharmacy Consult for heparin Indication: hx DVT, factor V Leiden, Afib  Allergies  Allergen Reactions  . Lyrica (Pregabalin) Hives and Itching    Patient Measurements: Height: 5\' 9"  (175.3 cm) Weight: 196 lb (88.905 kg) IBW/kg (Calculated) : 70.7  Heparin Dosing Weight: 88 kg  Vital Signs: Temp: 98 F (36.7 C) (01/12 1800) Temp src: Oral (01/12 1800) BP: 149/60 mmHg (01/12 1800) Pulse Rate: 72  (01/12 1800)  Labs:  Steve [name] 01/21/12 1837 01/21/12 0626 01/20/12 1848 01/20/12 0625 01/19/12 0625 01/18/12 2009  HGB -- 11.5* -- -- 12.2* --  HCT -- 35.2* -- -- 36.6* --  PLT -- 146* -- -- 139* --  APTT -- -- -- -- -- --  LABPROT -- -- -- -- -- --  INR -- -- -- -- -- --  HEPARINUNFRC 0.24* -- 0.17* -- -- 0.19*  CREATININE -- 0.92 -- 0.83 0.77 --  CKTOTAL -- -- -- -- -- --  CKMB -- -- -- -- -- --  TROPONINI -- -- -- -- -- --    Estimated Creatinine Clearance: 75.4 ml/min (by C-G formula based on Cr of 0.92).  Assessment: Patient is a 79 y.o M on heparin for hx DVT, factor V leiden, and afib.  ERCP today was unsuccessful with plan to take patient back tomorrow for another attempt. Per Dr. Christella Hartigan'  Note on 01/21/12, recom to hold heparin tonight at midnight for possible procedure tomorrow. Heparin level is subtherapeutic at 0.24.    Goal of Therapy:  Heparin level 0.3-0.7 units/ml Monitor platelets by anticoagulation protocol: Yes   Plan:  1) Will increase rate to 1900 units/hr. 2) d/c drip at midnight per MD's recommendation   Steve [name] P 01/21/2012,7:52 PM

## 2012-01-21 NOTE — Progress Notes (Signed)
Patient ID: Steve [name], male   DOB: Mar 03, 1933, 79 y.o.   MRN: [id number] Pt off floor for ERCP, wife in room and reports no problems overnight. Drain output 375cc/24hr Will recheck later today after patient returns from ERCP  Genevive Printup, [facility] 01/21/2012 7:27 AM

## 2012-01-21 NOTE — Interval H&P Note (Signed)
History and Physical Interval Note:  01/21/2012 7:06 AM  Steve [name]  has presented today for surgery, with the diagnosis of stent placement  The various methods of treatment have been discussed with the patient and family. After consideration of risks, benefits and other options for treatment, the patient has consented to  Procedure(s) (LRB) with comments: ENDOSCOPIC RETROGRADE CHOLANGIOPANCREATOGRAPHY (ERCP) (N/A) as a surgical intervention .  The patient's history has been reviewed, patient examined, no change in status, stable for surgery.  I have reviewed the patient's chart and labs.  Questions were answered to the patient's satisfaction.     Rob Bunting

## 2012-01-21 NOTE — Progress Notes (Signed)
ANTICOAGULATION CONSULT NOTE - Initial Consult  Pharmacy Consult for heparin Indication: VTE treatment  Allergies  Allergen Reactions  . Lyrica (Pregabalin) Hives and Itching    Patient Measurements: Height: 5\' 9"  (175.3 cm) Weight: 196 lb (88.905 kg) IBW/kg (Calculated) : 70.7    Vital Signs: Temp: 98.6 F (37 C) (01/12 1015) Temp src: Oral (01/12 0622) BP: 139/57 mmHg (01/12 1015) Pulse Rate: 73  (01/12 1015)  Labs:  Alvira Philips 01/21/12 0626 01/20/12 1848 01/20/12 0625 01/19/12 0625 01/18/12 2009  HGB 11.5* -- -- 12.2* --  HCT 35.2* -- -- 36.6* --  PLT 146* -- -- 139* --  APTT -- -- -- -- --  LABPROT -- -- -- -- --  INR -- -- -- -- --  HEPARINUNFRC -- 0.17* -- -- 0.19*  CREATININE 0.92 -- 0.83 0.77 --  CKTOTAL -- -- -- -- --  CKMB -- -- -- -- --  TROPONINI -- -- -- -- --    Estimated Creatinine Clearance: 75.4 ml/min (by C-G formula based on Cr of 0.92).   Medical History: Past Medical History  Diagnosis Date  . Prostate enlargement   . History of chicken pox     childhood  . GERD (gastroesophageal reflux disease)   . Arthritis     knee and back  . AAA (abdominal aortic aneurysm)     stress test 09/14/10 EPIC  . Anxiety   . Dysrhythmia     hx of atrial fib post op x 2 days   . COPD (chronic obstructive pulmonary disease)   . Shortness of breath     with exertion   . Recurrent upper respiratory infection (URI)     productive cough- white phlegm- no fever  . Sleep apnea      12/12 sleep study,SEVERE per study  Dr Delton Coombes- states doesnt wear machine on regular basis- setting BiPAP 9/9  . Hypertension     chest x ray 12/12 EPIC- repeated 06/06/11, EKG 11/12 EPIC  . DVT (deep venous thrombosis) 08/2011  . Pneumonia   . Bruises easily     takes Xarelto    Medications:  Prescriptions prior to admission  Medication Sig Dispense Refill  . albuterol (PROVENTIL HFA;VENTOLIN HFA) 108 (90 BASE) MCG/ACT inhaler Inhale 2 puffs into the lungs every 6 (six) hours as  needed for wheezing.  1 Inhaler  2  . ALPRAZolam (XANAX) 0.25 MG tablet Take 0.25 mg by mouth at bedtime.      . Rivaroxaban (XARELTO) 20 MG TABS Take 1 tablet (20 mg total) by mouth every morning.  30 tablet  3  . senna (SENOKOT) 8.6 MG TABS Take 2 tablets by mouth at bedtime.      . sertraline (ZOLOFT) 50 MG tablet Take 50 mg by mouth every morning.      Marland Kitchen SPIRIVA HANDIHALER 18 MCG inhalation capsule INHALE ONE DOSE BY MOUTH EVERY DAY  30 capsule  0    Assessment: Heparin for hx DVT/lupus AC positive/factor V mutation/Afib while Xarelto on hold for surgery plans. Last Xarelto dose on 1/7. Pt went for ERCP today, so heparin has been held since 0200.  Now restarting heparin. Heparin drip to be stopped at midnight per MD for possible repeat attempt ERCP tomorrow morning.  No bleeding noted; CBC stable    Goal of Therapy:  Heparin level 0.3-0.7 units/ml Monitor platelets by anticoagulation protocol: Yes   Plan:  - Heparin bolus 4000 units x1 - heparin drip at 1750 units/hr - 8 hr hep  level - Stop drip at midnight per Dr. Christella Hartigan note after ERCP attempt 1/12 -- Stop time in place     Doris Cheadle, PharmD Clinical Pharmacist Pager: [phone] Phone: [phone] 01/21/2012 10:45 AM

## 2012-01-21 NOTE — Progress Notes (Signed)
Patient is instilled down stairs getting ERCP. Apparently stable but still with significant bile leak. Hopefully endoscopic stent will allow leak to close. Will followup later.   Angelia Mould. Derrell Lolling, M.D., [facility] General and Minimally invasive Surgery Breast and Colorectal Surgery Office:   [phone] Pager:   [phone]

## 2012-01-21 NOTE — Preoperative (Signed)
Beta Blockers   Reason not to administer Beta Blockers:Not Applicable 

## 2012-01-21 NOTE — Op Note (Signed)
[name] [address] [place], [address]   ERCP PROCEDURE REPORT  PATIENT: Steve, [name].  MR# :[id number] BIRTHDATE: Sep 14, 1933  GENDER: Male ENDOSCOPIST: Rachael Fee, MD REFERRED BY: Violeta Gelinas, M.D. PROCEDURE DATE:  01/21/2012 PROCEDURE:   ERCP, incomplete ASA CLASS:   Class IV INDICATIONS:recent acute cholecystitis with CHD injury, repaired in OR; bilious drain output. MEDICATIONS: General endotracheal anesthesia (GETA) TOPICAL ANESTHETIC: Cetacaine Spray  DESCRIPTION OF PROCEDURE:   After the risks benefits and alternatives of the procedure were thoroughly explained, informed consent was obtained.  The [id number]  endoscope was introduced through the mouth  and advanced to the second portion of the duodenum without detailed examination of the UGI tract.  The major papilla was normal appearing.  I was unable to cannulate the bile duct despite using two different sphincterotomes and three different wires.  A wire was temporarily placed into the main pancreatic duct and dye was injected briefly.  The scope was then completely withdrawn from the patient and the procedure terminated.     COMPLICATIONS: There were no complications.  ENDOSCOPIC IMPRESSION: Incomplete ERCP, unable to cannulate bile duct.  RECOMMENDATIONS: Will discuss case at AM rounds tomorrow, consider repeat ERCP attempt. Can restart his heparin today but hold it again at Midnight in case ERCP to be attempted again tomorrow.   _______________________________ eSignedRachael Fee, MD 01/21/2012 9:44 AM

## 2012-01-21 NOTE — Progress Notes (Signed)
[facility] Progress Note Day of Surgery  Subjective: The patient has just come back from endoscopy.  Dr. Christella Hartigan was not able to get the stent in place.  He is still sleepy from the procedure.  Objective: Vital signs in last 24 hours: Temp:  [97.8 F (36.6 C)-98.6 F (37 C)] 98.6 F (37 C) (01/12 0940) Pulse Rate:  [58-82] 73  (01/12 1015) Resp:  [16] 16  (01/12 0940) BP: (110-140)/(49-65) 139/57 mmHg (01/12 1015) SpO2:  [89 %-92 %] 90 % (01/12 1015) Weight:  [88.905 kg (196 lb)] 88.905 kg (196 lb) (01/11 2210) Last BM Date: 01/17/12  Intake/Output from previous day: 01/11 0701 - 01/12 0700 In: 120 [P.O.:120] Out: 850 [Urine:475; Drains:375] Intake/Output this shift: Total I/O In: 800 [I.V.:800] Out: 100 [Drains:100]  General: No acute distress  Lungs: Clear  Abd: Soft, tender diffusely, but not peritonitis  Extremities: No DVT signs or symptoms  Neuro: Intact  Lab Results:  @LABLAST2 (wbc:2,hgb:2,hct:2,plt:2) BMET  Basename 01/21/12 0626 01/20/12 0625  NA 136 137  K 3.8 3.9  CL 102 101  CO2 24 23  GLUCOSE 88 115*  BUN 15 13  CREATININE 0.92 0.83  CALCIUM 8.8 8.8   PT/INR No results found for this basename: LABPROT:2,INR:2 in the last 72 hours ABG No results found for this basename: PHART:2,PCO2:2,PO2:2,HCO3:2 in the last 72 hours  Studies/Results: Dg Cholangiogram Operative  01/19/2012   *RADIOLOGY REPORT*  Clinical Data:   Laparoscopic cholecystectomy  INTRAOPERATIVE CHOLANGIOGRAM  Technique:  Cholangiographic images from the C-arm fluoroscopic device were submitted for interpretation post-operatively.  Please see the procedural report for the amount of contrast and the fluoroscopy time utilized.  Comparison:  None.  Findings:  Intraoperative cholangiogram demonstrates opacification of normal caliber common bile duct and intrahepatic ducts.  No extravasation of contrast.  No evidence for choledocholithiasis.  IMPRESSION:  1.  Normal postoperative cholangiogram  status post cholecystectomy.   Original Report Authenticated By: Signa Kell, M.D.     Anti-infectives: Anti-infectives     Start     Dose/Rate Route Frequency Ordered Stop   01/18/12 0800   ciprofloxacin (CIPRO) IVPB 400 mg        400 mg 200 mL/hr over 60 Minutes Intravenous Every 12 hours 01/17/12 2351     01/18/12 0600   metroNIDAZOLE (FLAGYL) IVPB 500 mg        500 mg 100 mL/hr over 60 Minutes Intravenous Every 8 hours 01/17/12 2351     01/17/12 1915   ciprofloxacin (CIPRO) IVPB 400 mg        400 mg 200 mL/hr over 60 Minutes Intravenous  Once 01/17/12 1914 01/17/12 2156   01/17/12 1915   metroNIDAZOLE (FLAGYL) IVPB 500 mg        500 mg 100 mL/hr over 60 Minutes Intravenous  Once 01/17/12 1914 01/17/12 2033          Assessment/Plan: s/p Procedure(s): ENDOSCOPIC RETROGRADE CHOLANGIOPANCREATOGRAPHY (ERCP) Advance diet Continue ABX therapy due to Post-op infection GI will possibly attempt stent placement again sometime this week.  May require that the postoperative swellin resolve a bit prior to next attempt.  LOS: 4 days   Marta Lamas. Gae Bon, MD, FACS [phone] [facility] 01/21/2012

## 2012-01-22 ENCOUNTER — Encounter (HOSPITAL_COMMUNITY)
Admission: EM | Disposition: A | Discharge status: Home / Self Care | Payer: Self-pay | Source: Home / Self Care | Attending: Internal Medicine

## 2012-01-22 ENCOUNTER — Inpatient Hospital Stay (HOSPITAL_COMMUNITY): Payer: Medicare Other

## 2012-01-22 ENCOUNTER — Encounter (HOSPITAL_COMMUNITY): Payer: Self-pay | Admitting: Critical Care Medicine

## 2012-01-22 ENCOUNTER — Inpatient Hospital Stay (HOSPITAL_COMMUNITY): Payer: Medicare Other | Admitting: Critical Care Medicine

## 2012-01-22 HISTORY — PX: ERCP: SHX5425

## 2012-01-22 LAB — GLUCOSE, CAPILLARY: Glucose-Capillary: 90 mg/dL (ref 70–99)

## 2012-01-22 SURGERY — ERCP, WITH INTERVENTION IF INDICATED
Anesthesia: General

## 2012-01-22 MED ORDER — OXYCODONE HCL 5 MG/5ML PO SOLN
ORAL | Status: AC
  Filled 2012-01-22: qty 5

## 2012-01-22 MED ORDER — LACTATED RINGERS IV SOLN
INTRAVENOUS | Status: DC | PRN
  Administered 2012-01-22: 13:00:00 via INTRAVENOUS

## 2012-01-22 MED ORDER — HYDROMORPHONE HCL PF 1 MG/ML IJ SOLN
INTRAMUSCULAR | Status: AC
  Filled 2012-01-22: qty 1

## 2012-01-22 MED ORDER — PROMETHAZINE HCL 25 MG PO TABS: 12.5000 mg | ORAL_TABLET | Freq: Four times a day (QID) | ORAL | Status: DC | PRN

## 2012-01-22 MED ORDER — ONDANSETRON HCL 4 MG/2ML IJ SOLN
INTRAMUSCULAR | Status: DC | PRN
  Administered 2012-01-22: 4 mg via INTRAVENOUS

## 2012-01-22 MED ORDER — EPHEDRINE SULFATE 50 MG/ML IJ SOLN
INTRAMUSCULAR | Status: DC | PRN
  Administered 2012-01-22: 10 mg via INTRAVENOUS
  Administered 2012-01-22: 20 mg via INTRAVENOUS

## 2012-01-22 MED ORDER — PHENYLEPHRINE HCL 10 MG/ML IJ SOLN
INTRAMUSCULAR | Status: DC | PRN
  Administered 2012-01-22: 80 ug via INTRAVENOUS

## 2012-01-22 MED ORDER — FENTANYL CITRATE 0.05 MG/ML IJ SOLN
INTRAMUSCULAR | Status: DC | PRN
  Administered 2012-01-22: 100 ug via INTRAVENOUS
  Administered 2012-01-22: 50 ug via INTRAVENOUS

## 2012-01-22 MED ORDER — HEPARIN (PORCINE) IN NACL 100-0.45 UNIT/ML-% IJ SOLN
1900.0000 [IU]/h | INTRAMUSCULAR | Status: DC
  Administered 2012-01-23: 1900 [IU]/h via INTRAVENOUS
  Filled 2012-01-22 (×2): qty 250

## 2012-01-22 MED ORDER — OXYCODONE HCL 5 MG/5ML PO SOLN
5.0000 mg | Freq: Once | ORAL | Status: AC | PRN
  Administered 2012-01-22: 5 mg via ORAL

## 2012-01-22 MED ORDER — LIDOCAINE HCL (CARDIAC) 20 MG/ML IV SOLN
INTRAVENOUS | Status: DC | PRN
  Administered 2012-01-22: 100 mg via INTRAVENOUS

## 2012-01-22 MED ORDER — PROMETHAZINE HCL 25 MG RE SUPP: 12.5000 mg | Freq: Four times a day (QID) | RECTAL | Status: DC | PRN

## 2012-01-22 MED ORDER — ARTIFICIAL TEARS OP OINT
TOPICAL_OINTMENT | OPHTHALMIC | Status: DC | PRN
  Administered 2012-01-22: 1 via OPHTHALMIC

## 2012-01-22 MED ORDER — SODIUM CHLORIDE 0.9 % IV SOLN
INTRAVENOUS | Status: DC
  Administered 2012-01-22: 12:00:00 via INTRAVENOUS

## 2012-01-22 MED ORDER — ONDANSETRON HCL 4 MG/2ML IJ SOLN
INTRAMUSCULAR | Status: AC
  Filled 2012-01-22: qty 2

## 2012-01-22 MED ORDER — SUCCINYLCHOLINE CHLORIDE 20 MG/ML IJ SOLN
INTRAMUSCULAR | Status: DC | PRN
  Administered 2012-01-22: 120 mg via INTRAVENOUS

## 2012-01-22 MED ORDER — PROMETHAZINE HCL 25 MG/ML IJ SOLN
12.5000 mg | Freq: Four times a day (QID) | INTRAMUSCULAR | Status: DC | PRN
  Administered 2012-01-22: 12.5 mg via INTRAVENOUS
  Filled 2012-01-22: qty 1

## 2012-01-22 MED ORDER — ONDANSETRON HCL 4 MG/2ML IJ SOLN: 4.0000 mg | Freq: Once | INTRAMUSCULAR | Status: DC | PRN

## 2012-01-22 MED ORDER — LACTATED RINGERS IV SOLN
INTRAVENOUS | Status: DC
  Administered 2012-01-22: 13:00:00 via INTRAVENOUS
  Administered 2012-01-24: 10 mL/h via INTRAVENOUS

## 2012-01-22 MED ORDER — OXYCODONE HCL 5 MG PO TABS: 5.0000 mg | ORAL_TABLET | Freq: Once | ORAL | Status: AC | PRN

## 2012-01-22 MED ORDER — PROPOFOL 10 MG/ML IV BOLUS
INTRAVENOUS | Status: DC | PRN
  Administered 2012-01-22: 150 mg via INTRAVENOUS
  Administered 2012-01-22 (×2): 50 mg via INTRAVENOUS

## 2012-01-22 MED ORDER — HYDROMORPHONE HCL PF 1 MG/ML IJ SOLN: 0.2500 mg | INTRAMUSCULAR | Status: DC | PRN

## 2012-01-22 NOTE — Progress Notes (Signed)
[facility] PROGRESS NOTE  Steve [name]:[id number] DOB: 1933/06/16 DOA: 01/17/2012 PCP: Steve [name], Steve Dach, MD  Brief History: Steve [name] is a 79 y.o. male who presents with c/o 3 day history of abdominal pain. Profound nausea and one episode of emesis. Pain is worst in the RUQ. Worse with eating especially a large meal, better without eating. Presented to the ED on 01/17/2012. In the ED patient was found to have acute cholecystitis.  Assessment/Plan: Acute cholecystitis s/p Laparoscopic cholecystectomy on 01/19/2012 Currently on Cipro/Flagyl. Management per General Surgery, Dr. Magnus Ivan. GI consulted for small pinhold in common hepatic duc that was repaired for possible stent placement.  GI attempted ERCP on 01/21/2012 but was unsuccessful in placing a stent. GI to attempt today for another ERCP. Heparin and diet as per GI.  A.Fib Rate controlled, continue metoprolol. Resume heparin drip as per GI, transition to xeralto after the procedure.  Mesothelioma S/p VATS on 11/02/2011 and drain placement. Drain removed about 2 weeks ago per family. Has followed up with Dr. Myna [name] on 11/29/2011 and had PET scan done which did not show any activity on the right side and no obvious metastatic disease. Conservative management was recommended.  History of DVT on 09/01/2011 in the right femoral vein, popliteal vein, and posterior tibial vein with Factor V Leiden mutation Anticoagulated on heparin drip, transition to xeralto after the procedure.  HTN  Continue metoprolol.  UTI  Very mild UTI, urine culture showed insignificant growth.  Patient already on cipro.  Depression Stable, continue sertraline.  COPD Not active at this time. Continue home inhalers.  Hypokalemia Replace as needed.  Mildly elevated LFTs Likely due to acute cholecystitis.  Prophylaxis Heparin drip.  Code Status: Full code Family Communication: Wife at bedside. Disposition Plan:  Pending.   Consultants:  Dr. Magnus Ivan, General Surgery  Dr. Christella Hartigan, GI  Procedures:  None  Antibiotics:  Ciprofloxacin 01/17/2012 >>  Metronidazole 01/17/2012 >>  HPI/Subjective: No specific concerns. Denies any abdominal pain.  Objective: Filed Vitals:   01/21/12 1400 01/21/12 1800 01/21/12 2125 01/22/12 0544  BP: 141/61 149/60 126/49 141/56  Pulse: 66 72 57 58  Temp:  98 F (36.7 C) 97.9 F (36.6 C) 97.5 F (36.4 C)  TempSrc:  Oral Oral Oral  Resp: 18 29 20 20   Height:      Weight:   87.091 kg (192 lb)   SpO2: 88% 90% 94% 94%    Intake/Output Summary (Last 24 hours) at 01/22/12 0807 Last data filed at 01/22/12 0500  Gross per 24 hour  Intake  852.5 ml  Output   1150 ml  Net -297.5 ml   Filed Weights   01/19/12 2155 01/20/12 2210 01/21/12 2125  Weight: 87.998 kg (194 lb) 88.905 kg (196 lb) 87.091 kg (192 lb)    Exam: Physical Exam: General: Awake, Oriented, No acute distress. HEENT: EOMI. Neck: Supple CV: S1 and S2 Lungs: Clear to ascultation bilaterally Abdomen: Soft, mild tenderness in the right upper quadrent, Nondistended, +bowel sounds. Ext: Good pulses. Trace edema.  Data Reviewed: Basic Metabolic Panel:  Lab 01/21/12 8119 01/20/12 0625 01/19/12 0625 01/18/12 0500 01/17/12 1645  NA 136 137 136 135 138  K 3.8 3.9 3.5 3.1* 3.2*  CL 102 101 101 100 97  CO2 24 23 25 27 26   GLUCOSE 88 115* 91 94 130*  BUN 15 13 14 22 22   CREATININE 0.92 0.83 0.77 1.01 1.10  CALCIUM 8.8 8.8 8.7 8.9 9.8  MG -- -- 2.0 -- --  PHOS -- -- -- -- --   Liver Function Tests:  Lab 01/20/12 0625 01/19/12 0625 01/17/12 1645  AST 26 18 37  ALT 27 33 72*  ALKPHOS 111 128* 151*  BILITOT 0.6 0.7 0.8  PROT 6.7 6.6 7.9  ALBUMIN 2.8* 2.5* 3.0*    Lab 01/17/12 1645  LIPASE 13  AMYLASE --   No results found for this basename: AMMONIA:5 in the last 168 hours CBC:  Lab 01/21/12 0626 01/19/12 0625 01/18/12 0500 01/17/12 1645  WBC 9.1 7.6 11.5* 14.1*  NEUTROABS -- --  -- 11.6*  HGB 11.5* 12.2* 12.1* 14.5  HCT 35.2* 36.6* 36.7* 43.6  MCV 94.6 92.0 92.4 93.2  PLT 146* 139* 147* 150   Cardiac Enzymes: No results found for this basename: CKTOTAL:5,CKMB:5,CKMBINDEX:5,TROPONINI:5 in the last 168 hours BNP (last 3 results)  Basename 06/17/11 0918  PROBNP 919.3*   CBG:  Lab 01/22/12 0736 01/20/12 0742 01/19/12 0734 01/18/12 0801  GLUCAP 90 107* 86 95    Recent Results (from the past 240 hour(s))  URINE CULTURE     Status: Normal   Collection Time   01/17/12  4:47 PM      Component Value Range Status Comment   Specimen Description URINE, CLEAN CATCH   Final    Special Requests NONE   Final    Culture  Setup Time 01/17/2012 19:08   Final    Colony Count 2,000 COLONIES/ML   Final    Culture INSIGNIFICANT GROWTH   Final    Report Status 01/18/2012 FINAL   Final   SURGICAL PCR SCREEN     Status: Normal   Collection Time   01/19/12  6:44 AM      Component Value Range Status Comment   MRSA, PCR NEGATIVE  NEGATIVE Final    Staphylococcus aureus NEGATIVE  NEGATIVE Final      Studies: Dg C-arm 61-120 Min-no Report  01/21/2012  CLINICAL DATA: possible stent placement   C-ARM 61-120 MINUTES  Fluoroscopy was utilized by the requesting physician.  No radiographic  interpretation.      Scheduled Meds:    . beta carotene w/minerals  1 tablet Oral Daily  . ciprofloxacin  400 mg Intravenous Q12H  . metoprolol tartrate  25 mg Oral BID  . metronidazole  500 mg Intravenous Q8H  . nicotine  14 mg Transdermal Daily  . senna  2 tablet Oral BID  . sertraline  50 mg Oral q morning - 10a  . tiotropium  18 mcg Inhalation Daily   Continuous Infusions:    Principal Problem:  *Acute cholecystitis Active Problems:  COPD (chronic obstructive pulmonary disease)  Depression  Hypertension  Atrial fibrillation  History of DVT (deep vein thrombosis)  Hypokalemia  Bile duct injury   Steve [name] A  [facility] Pager [phone]. If 7PM-7AM, please  contact night-coverage at [email or web address], password [email or web address] 01/22/2012, 8:07 AM  LOS: 5 days

## 2012-01-22 NOTE — Progress Notes (Signed)
Patient ID: Steve [name], male   DOB: 1933-12-09, 79 y.o.   MRN: [id number] 1 Day Post-Op  Subjective: Doing ok today, some soreness in right upper quad, poss able to have ERCP today, denies nausea/vomiting, no significant issues overnight  Objective: Vital signs in last 24 hours: Temp:  [97.2 F (36.2 C)-98.6 F (37 C)] 97.5 F (36.4 C) (01/13 0544) Pulse Rate:  [57-82] 58  (01/13 0544) Resp:  [16-29] 20  (01/13 0544) BP: (111-149)/(49-65) 141/56 mmHg (01/13 0544) SpO2:  [88 %-100 %] 91 % (01/13 0811) FiO2 (%):  [100 %] 100 % (01/12 1003) Weight:  [192 lb (87.091 kg)] 192 lb (87.091 kg) (01/12 2125) Last BM Date: 01/17/12  Intake/Output from previous day: 01/12 0701 - 01/13 0700 In: 852.5 [I.V.:852.5] Out: 1150 [Urine:850; Drains:300] Intake/Output this shift:    PE: Abd: relatively soft, +BS, incisions c/d/i Drain: 300cc overnight, bilious in appearance   Lab Results:   [facility] 01/21/12 0626  WBC 9.1  HGB 11.5*  HCT 35.2*  PLT 146*   BMET  Basename 01/21/12 0626 01/20/12 0625  NA 136 137  K 3.8 3.9  CL 102 101  CO2 24 23  GLUCOSE 88 115*  BUN 15 13  CREATININE 0.92 0.83  CALCIUM 8.8 8.8   PT/INR No results found for this basename: LABPROT:2,INR:2 in the last 72 hours CMP     Component Value Date/Time   NA 136 01/21/2012 0626   NA 143 01/09/2012 1100   K 3.8 01/21/2012 0626   K 3.9 01/09/2012 1100   CL 102 01/21/2012 0626   CL 103 01/09/2012 1100   CO2 24 01/21/2012 0626   CO2 31 01/09/2012 1100   GLUCOSE 88 01/21/2012 0626   GLUCOSE 94 01/09/2012 1100   BUN 15 01/21/2012 0626   BUN 18 01/09/2012 1100   CREATININE 0.92 01/21/2012 0626   CREATININE 1.3* 01/09/2012 1100   CALCIUM 8.8 01/21/2012 0626   CALCIUM 9.6 01/09/2012 1100   PROT 6.7 01/20/2012 0625   ALBUMIN 2.8* 01/20/2012 0625   AST 26 01/20/2012 0625   ALT 27 01/20/2012 0625   ALKPHOS 111 01/20/2012 0625   BILITOT 0.6 01/20/2012 0625   GFRNONAA 80* 01/21/2012 0626   GFRAA >90 01/21/2012 0626    Lipase     Component Value Date/Time   LIPASE 13 01/17/2012 1645       Studies/Results: Dg C-arm 61-120 Min-no Report  01/21/2012  CLINICAL DATA: possible stent placement   C-ARM 61-120 MINUTES  Fluoroscopy was utilized by the requesting physician.  No radiographic  interpretation.      Anti-infectives: Anti-infectives     Start     Dose/Rate Route Frequency Ordered Stop   01/18/12 0800   ciprofloxacin (CIPRO) IVPB 400 mg        400 mg 200 mL/hr over 60 Minutes Intravenous Every 12 hours 01/17/12 2351     01/18/12 0600   metroNIDAZOLE (FLAGYL) IVPB 500 mg        500 mg 100 mL/hr over 60 Minutes Intravenous Every 8 hours 01/17/12 2351     01/17/12 1915   ciprofloxacin (CIPRO) IVPB 400 mg        400 mg 200 mL/hr over 60 Minutes Intravenous  Once 01/17/12 1914 01/17/12 2156   01/17/12 1915   metroNIDAZOLE (FLAGYL) IVPB 500 mg        500 mg 100 mL/hr over 60 Minutes Intravenous  Once 01/17/12 1914 01/17/12 2033  Assessment/Plan 1. POD#3-lap chole: small pinhole in common hepatic duct that was repaired, drain with bilious appearing drainage, GI to poss repeat ERCP today and possible stent to relieve pressure in duct.  --pending ERCP  --drain care  --Heparin gtt  --restart xarelto after all procedures finished.   LOS: 5 days    Candida Vetter 01/22/2012

## 2012-01-22 NOTE — Transfer of Care (Signed)
Immediate Anesthesia Transfer of Care Note  Patient: Steve [name]  Procedure(s) Performed: Procedure(s) (LRB) with comments: ENDOSCOPIC RETROGRADE CHOLANGIOPANCREATOGRAPHY (ERCP) (N/A) - with stent placement  Patient Location: PACU  Anesthesia Type:General  Level of Consciousness: awake, alert  and patient cooperative  Airway & Oxygen Therapy: Patient Spontanous Breathing and Patient connected to face mask oxygen  Post-op Assessment: Report given to PACU RN, Post -op Vital signs reviewed and stable and Patient moving all extremities  Post vital signs: Reviewed and stable  Complications: No apparent anesthesia complications

## 2012-01-22 NOTE — Progress Notes (Signed)
DIscussed case at GI meeting.  I think it is worthwhile repeating ERCP.  Will proceed today if OR schedule allows.

## 2012-01-22 NOTE — Anesthesia Procedure Notes (Signed)
Procedure Name: Intubation Date/Time: 01/22/2012 1:50 PM Performed by: Carmela Rima Pre-anesthesia Checklist: Patient identified, Timeout performed, Emergency Drugs available, Suction available and Patient being monitored Patient Re-evaluated:Patient Re-evaluated prior to inductionOxygen Delivery Method: Circle system utilized Preoxygenation: Pre-oxygenation with 100% oxygen Intubation Type: IV induction and Rapid sequence Ventilation: Mask ventilation without difficulty Laryngoscope Size: Mac and 3 Grade View: Grade I Tube type: Oral Tube size: 7.5 mm Number of attempts: 1 Placement Confirmation: ETT inserted through vocal cords under direct vision,  breath sounds checked- equal and bilateral and positive ETCO2 Secured at: 23 cm Tube secured with: Tape Dental Injury: Teeth and Oropharynx as per pre-operative assessment

## 2012-01-22 NOTE — Preoperative (Signed)
Beta Blockers   Reason not to administer Beta Blockers:Not Applicable 

## 2012-01-22 NOTE — Progress Notes (Signed)
ERCP did not demonstrate an obvious bile duct leak. A plastic bile duct stent was placed following  pancreatic duct stent placement (to facilitate cannulation of the common bile duct) and a sphincterotomy.  He will need followup KUB prior to discharge to determine if the pancreatic duct has spontaneously migrated out of the duct. Recommend followup ERCP in 6-8 weeks (per Dr. Christella Hartigan).

## 2012-01-22 NOTE — H&P (View-Only) (Signed)
DIscussed case at GI meeting.  I think it is worthwhile repeating ERCP.  Will proceed today if OR schedule allows. 

## 2012-01-22 NOTE — Progress Notes (Addendum)
Patient examined and I agree with the assessment and plan I met with Dr. Arlyce Dice from GI this AM and he plans ERCP today for stent placement.  Their F/U is appreciated.I spoke with the patient and his wife about the plan at length and answered their questions.  Violeta Gelinas, MD, MPH, FACS Pager: [phone]  01/22/2012 8:56 AM

## 2012-01-22 NOTE — Op Note (Addendum)
[name] [address] [place], [address]   ERCP PROCEDURE REPORT  PATIENT: Steve [name], Steve [name].  MR# :[id number] BIRTHDATE: Dec 11, 1933  GENDER: Male ENDOSCOPIST: Louis Meckel, MD REFERRED BY: Violeta Gelinas, M.D. PROCEDURE DATE:  01/22/2012 PROCEDURE:   ERCP with stent placement and ERCP with sphincterotomy/papillotomy ASA CLASS:   Class II INDICATIONS:established bile leak. MEDICATIONS: MAC sedation, administered by CRNA TOPICAL ANESTHETIC:  DESCRIPTION OF PROCEDURE:   After the risks benefits and alternatives of the procedure were thoroughly explained, informed consent was obtained.  The Pentax ERCP [id number]  endoscope was introduced through the mouth  and advanced to the second portion of the duodenum .  Pancreatic duct was cannulated with a 0.50mm guidewire.  A minimal injection was made revealing a normal duct in the pancreatic head. Because of initial difficulty cannulating the CBD a 4Fr 3cm pigtail stent was placed. The CBD was cannulated with a 0.37mm guidewire.  Injection did not demonstrate an obvious leak.  A 12-82mm sphincterotomy was made.  A 12mm balloon stone extractor was passed into the proximal CHD and inflated to 12mm and pulled through the sphincterotomized papilla. No stones were seen in the duosdenum.  A 10Fr7cm plastic biliary stent was placed. The scope was then completely withdrawn from the patient and the procedure terminated.     COMPLICATIONS:  ENDOSCOPIC IMPRESSION: s/p sphincterotomy and placement of CBD (and pancreatic duct) stents for possible minimal bile duct leak  RECOMMENDATIONS: f/u KUB prior to discharge checking to see if pt passed pancreatic stent f/u ERCP in 6-8 weeks (per Dr. Christella Hartigan)    _______________________________ Activated:  01/22/2012 3:06 PM   CC:

## 2012-01-22 NOTE — Interval H&P Note (Signed)
History and Physical Interval Note:  01/22/2012 2:11 PM  Steve [name]  has presented today for surgery, with the diagnosis of possible  bile leak  The various methods of treatment have been discussed with the patient and family. After consideration of risks, benefits and other options for treatment, the patient has consented to  Procedure(s) (LRB) with comments: ENDOSCOPIC RETROGRADE CHOLANGIOPANCREATOGRAPHY (ERCP) (N/A) - with stent placement as a surgical intervention .  The patient's history has been reviewed, patient examined, no change in status, stable for surgery.  I have reviewed the patient's chart and labs.  Questions were answered to the patient's satisfaction.    The recent H&P (dated *01/22/12*) was reviewed, the patient was examined and there is no change in the patients condition since that H&P was completed.   [name]  01/22/2012, 2:11 PM    [name]

## 2012-01-22 NOTE — OR Nursing (Signed)
Patient was placed in the prone position using chest rolls.  Pillows were placed under knees.  Head was supported by pillow and yellow foam.  Arms were tucked at the patient's side. Patrick Jupiter, RN Arby Barrette, RN

## 2012-01-22 NOTE — Anesthesia Postprocedure Evaluation (Signed)
  Anesthesia Post-op Note  Patient: Steve [name]  Procedure(s) Performed: Procedure(s) (LRB) with comments: ENDOSCOPIC RETROGRADE CHOLANGIOPANCREATOGRAPHY (ERCP) (N/A) - with stent placement  Patient Location: PACU  Anesthesia Type:General  Level of Consciousness: awake  Airway and Oxygen Therapy: Patient Spontanous Breathing  Post-op Pain: mild  Post-op Assessment: Post-op Vital signs reviewed  Post-op Vital Signs: Reviewed  Complications: No apparent anesthesia complications

## 2012-01-22 NOTE — Progress Notes (Signed)
ANTICOAGULATION CONSULT NOTE - Follow Up Consult  Pharmacy Consult for Heparin Indication:  HX DVT/factor V mutation/Afib  Allergies  Allergen Reactions  . Lyrica (Pregabalin) Hives and Itching    Patient Measurements: Height: 5\' 9"  (175.3 cm) Weight: 192 lb (87.091 kg) IBW/kg (Calculated) : 70.7  Heparin Dosing Weight: 87 kg  Vital Signs: Temp: 97.3 F (36.3 C) (01/13 1644) BP: 150/59 mmHg (01/13 1644) Pulse Rate: 66  (01/13 1644)  Labs:  Basename 01/21/12 1837 01/21/12 0626 01/20/12 1848 01/20/12 0625  HGB -- 11.5* -- --  HCT -- 35.2* -- --  PLT -- 146* -- --  APTT -- -- -- --  LABPROT -- -- -- --  INR -- -- -- --  HEPARINUNFRC 0.24* -- 0.17* --  CREATININE -- 0.92 -- 0.83  CKTOTAL -- -- -- --  CKMB -- -- -- --  TROPONINI -- -- -- --    Estimated Creatinine Clearance: 74.7 ml/min (by C-G formula based on Cr of 0.92).   Assessment: 79 y.o M who has been bridged on heparin for hx DVT/factor V leiden/Afib this admission while home Xarelto on hold. The patient had an unsuccessful ERCP procedure on 1/12 and was taken back to the OR today. Pharmacy has now received orders to resume heparin ~12 hours s/p ERCP today. The patient appears to have completed the procedure around ~1600 today -- so will resume heparin in the morning.   Goal of Therapy:  Heparin level 0.3-0.7 units/ml Monitor platelets by anticoagulation protocol: Yes   Plan:  1. Resume heparin at 1900 units/hr (19 ml/hr) starting at 0400 on 1/14 2. Will f/u plans to transition patient back to home Xarelto 3. Will continue to monitor for any signs/symptoms of bleeding and will follow up with heparin level in 8 hours after drip resumed  Georgina Pillion, PharmD, BCPS Clinical Pharmacist Pager: [phone] 01/22/2012 6:20 PM

## 2012-01-22 NOTE — Anesthesia Preprocedure Evaluation (Addendum)
Anesthesia Evaluation  Patient identified by MRN, date of birth, ID band Patient awake    Reviewed: Allergy & Precautions, H&P , NPO status , Patient's Chart, lab work & pertinent test results, reviewed documented beta blocker date and time   Airway Mallampati: I TM Distance: >3 FB Neck ROM: Full    Dental  (+) Edentulous Upper, Edentulous Lower and Dental Advidsory Given   Pulmonary shortness of breath and at rest, sleep apnea and Oxygen sleep apnea , pneumonia -, resolved, COPD COPD inhaler and oxygen dependent, Recent URI , Residual Cough,  breath sounds clear to auscultation        Cardiovascular hypertension, On Home Beta Blockers + Peripheral Vascular Disease Rhythm:Regular     Neuro/Psych PSYCHIATRIC DISORDERS    GI/Hepatic GERD-  ,  Endo/Other    Renal/GU      Musculoskeletal   Abdominal   Peds  Hematology   Anesthesia Other Findings   Reproductive/Obstetrics                         Anesthesia Physical Anesthesia Plan  ASA: III  Anesthesia Plan: General   Post-op Pain Management:    Induction: Intravenous  Airway Management Planned: Oral ETT  Additional Equipment:   Intra-op Plan:   Post-operative Plan: Extubation in OR  Informed Consent: I have reviewed the patients History and Physical, chart, labs and discussed the procedure including the risks, benefits and alternatives for the proposed anesthesia with the patient or authorized representative who has indicated his/her understanding and acceptance.   Dental advisory given and Dental Advisory Given  Plan Discussed with: CRNA, Anesthesiologist and Surgeon  Anesthesia Plan Comments:        Anesthesia Quick Evaluation

## 2012-01-23 ENCOUNTER — Encounter (HOSPITAL_COMMUNITY): Payer: Self-pay | Admitting: Gastroenterology

## 2012-01-23 ENCOUNTER — Inpatient Hospital Stay (HOSPITAL_COMMUNITY): Payer: Medicare Other

## 2012-01-23 ENCOUNTER — Ambulatory Visit: Payer: Medicare Other | Admitting: Thoracic Surgery (Cardiothoracic Vascular Surgery)

## 2012-01-23 LAB — CBC
MCV: 93.7 fL (ref 78.0–100.0)
Platelets: 186 10*3/uL (ref 150–400)
RBC: 3.48 MIL/uL — ABNORMAL LOW (ref 4.22–5.81)
RDW: 14.1 % (ref 11.5–15.5)
WBC: 9.8 10*3/uL (ref 4.0–10.5)

## 2012-01-23 LAB — BASIC METABOLIC PANEL
CO2: 27 mEq/L (ref 19–32)
Chloride: 102 mEq/L (ref 96–112)
Creatinine, Ser: 0.74 mg/dL (ref 0.50–1.35)
GFR calc Af Amer: >90 mL/min (ref >90)
Potassium: 3.3 mEq/L — ABNORMAL LOW (ref 3.5–5.1)
Sodium: 139 mEq/L (ref 135–145)

## 2012-01-23 LAB — HEPARIN LEVEL (UNFRACTIONATED): Heparin Unfractionated: <0.1 IU/mL — ABNORMAL LOW (ref 0.30–0.70)

## 2012-01-23 LAB — GLUCOSE, CAPILLARY: Glucose-Capillary: 81 mg/dL (ref 70–99)

## 2012-01-23 MED ORDER — BISACODYL 10 MG RE SUPP
10.0000 mg | Freq: Every day | RECTAL | Status: DC | PRN
  Administered 2012-01-23: 10 mg via RECTAL
  Filled 2012-01-23: qty 1

## 2012-01-23 MED ORDER — DOCUSATE SODIUM 100 MG PO CAPS
100.0000 mg | ORAL_CAPSULE | Freq: Every day | ORAL | Status: DC
  Administered 2012-01-23 – 2012-01-24 (×2): 100 mg via ORAL
  Filled 2012-01-23 (×3): qty 1

## 2012-01-23 MED ORDER — RIVAROXABAN 20 MG PO TABS
20.0000 mg | ORAL_TABLET | Freq: Every day | ORAL | Status: DC
  Administered 2012-01-24: 20 mg via ORAL
  Filled 2012-01-23 (×2): qty 1

## 2012-01-23 MED ORDER — HEPARIN (PORCINE) IN NACL 100-0.45 UNIT/ML-% IJ SOLN
2100.0000 [IU]/h | INTRAMUSCULAR | Status: DC
  Administered 2012-01-23 (×2): 2100 [IU]/h via INTRAVENOUS
  Filled 2012-01-23 (×2): qty 250

## 2012-01-23 NOTE — Progress Notes (Signed)
o2 sats  88  Oxygen  3/l   And   Given and  Demonstrated.  sats up  92.   No  Distress  noted

## 2012-01-23 NOTE — Progress Notes (Signed)
     [facility] Daily Rounding Note 01/23/2012, 9:42 AM  SUBJECTIVE:       r abdominal pain overnight, relieved with Morphine.  No recurrent pain.  No n/v. Tolerating FL diet.  Would love to eat a steak. Last BM was one week ago, has been getting Senna po twice daily,  colace ordered this AM..  Day 6 Cipro and Flagyl.   OBJECTIVE:         Vital signs in last 24 hours:    Temp:  [97.3 F (36.3 C)-98.1 F (36.7 C)] 97.3 F (36.3 C) (01/14 0918) Pulse Rate:  [54-82] 60  (01/14 0918) Resp:  [16-18] 18  (01/14 0918) BP: (103-160)/(59-66) 103/59 mmHg (01/14 0918) SpO2:  [90 %-98 %] 92 % (01/14 0918) Weight:  [88.6 kg (195 lb 5.2 oz)] 88.6 kg (195 lb 5.2 oz) (01/13 2009) Last BM Date: 01/17/12 General: looks better, not ill.   Heart: RRR Chest: clear B Abdomen: soft, tender in RuQ:  Mild.  No blood on bandages.  Pale bilious, yellow, non bloody JP drainage.   Extremities: no pedal edema Neuro/Psych:  Relaxed, not confused.   Intake/Output from previous day: 01/13 0701 - 01/14 0700 In: 960 [P.O.:10; I.V.:950] Out: 1590 [Urine:1200; Drains:390]  Intake/Output this shift: Total I/O In: 360 [P.O.:360] Out: 10 [Drains:10]  Lab Results:  Basename 01/21/12 0626  WBC 9.1  HGB 11.5*  HCT 35.2*  PLT 146*   BMET  Basename 01/21/12 0626  NA 136  K 3.8  CL 102  CO2 24  GLUCOSE 88  BUN 15  CREATININE 0.92  CALCIUM 8.8   Studies/Results: Dg Ercp With Sphincterotomy 01/22/2012    IMPRESSION: Imaging during ERCP procedure with placement of both pancreatic ductal and CBD endoscopic stents.  No obvious bile leak was identified at the time of cholangiography.  These images were submitted for radiologic interpretation only. Please see the procedural report for the amount of contrast and the fluoroscopy time utilized.   Original Report Authenticated By: Glenn Yamagata, M.D.     ASSESMENT: * Lap chole , repair of small perf in CHD 01/19/11 *  Unsuccessful ERCP 1/12    Successful ERCP,  sphincterotomy with placement of stents to CBD and PD fo possible minimal bile duct leak.   * DVT hx.  Xarelto PTA,  On hold now.  Heparin drip in place.  *  Will advance diet to heart healthy diet.    PLAN: *  f/u KUB prior to discharge checking to see if pt passed pancreatic  stent  f/u ERCP in 6-8 weeks (per Dr. Jacobs) *  Restart Xarelto? *  Add prn Dulcolax PR in case colace does not work. *  How long to continue Abx?     LOS: 6 days   Sarah Gribbin  01/23/2012, 9:42 AM Pager: [phone] I have personally taken an interval history, reviewed the chart, and examined the patient.  I agree with the extender's note, impression and recommendations.  May restart xarelto at time of discharge.  Mariesha Venturella D. Toniqua Melamed, MD, FACG [facility] [phone]  

## 2012-01-23 NOTE — Care Management Note (Signed)
   CARE MANAGEMENT NOTE 01/23/2012  Patient:  Steve [name], Steve [name]   Account Number:  [id number]  Date Initiated:  01/23/2012  Documentation initiated by:  Darlyne Russian  Subjective/Objective Assessment:   Patient admitted with abdominal pain  1/14 Order for HHPT     Action/Plan:   Progresssion of care and discharge planning  1/14 Met with pt and pt given list of [facility] providers , he will discuss with wife and CM will followup tomorrow.   Anticipated DC Date:  01/24/2012   Anticipated DC Plan:  HOME W HOME HEALTH SERVICES         [facility] Choice  HOME HEALTH   Choice offered to / List presented to:  C-1 Patient        HH arranged  HH-2 PT      Status of service:  In process, will continue to follow Medicare Important Message given?   (If response is "NO", the following Medicare IM given date fields will be blank) Date Medicare IM given:   Date Additional Medicare IM given:    Discharge Disposition:    Per UR Regulation:  Reviewed for med. necessity/level of care/duration of stay  If discussed at Long Length of Stay Meetings, dates discussed:    Comments:

## 2012-01-23 NOTE — Progress Notes (Signed)
Patient ID: Steve [name], male   DOB: [birth year], 79 y.o.   MRN: [id number] Subjective: Doing well s/p ERCP. Right sided abdominal pain in middle of night resolved with pain meds. No nausea, no vomiting. No BM x1 week. Passing gas. Voiding well. Tolerating full liquid diet  Objective: Vital signs in last 24 hours: Temp:  [97.3 F (36.3 C)-98.1 F (36.7 C)] 98.1 F (36.7 C) (01/14 0355) Pulse Rate:  [54-82] 54  (01/14 0355) Resp:  [16-18] 16  (01/14 0355) BP: (112-160)/(59-66) 112/65 mmHg (01/14 0355) SpO2:  [90 %-98 %] 90 % (01/14 0355) Weight:  [195 lb 5.2 oz (88.6 kg)] 195 lb 5.2 oz (88.6 kg) (01/13 2009) Last BM Date: 01/17/12  Intake/Output from previous day: 01/13 0701 - 01/14 0700 In: 960 [P.O.:10; I.V.:950] Out: 1590 [Urine:1200; Drains:390] Intake/Output this shift: Total I/O In: 360 [P.O.:360] Out: 10 [Drains:10]  PE: General: no acute distress Abd: soft, bowel sounds present, incisions clean, dry and intact Drain: 360 cc in 24hrs, bilious   Lab Results:   Basename 01/21/12 0626  WBC 9.1  HGB 11.5*  HCT 35.2*  PLT 146*   BMET  Basename 01/21/12 0626  NA 136  K 3.8  CL 102  CO2 24  GLUCOSE 88  BUN 15  CREATININE 0.92  CALCIUM 8.8   PT/INR No results found for this basename: LABPROT:2,INR:2 in the last 72 hours CMP     Component Value Date/Time   NA 136 01/21/2012 0626   NA 143 01/09/2012 1100   K 3.8 01/21/2012 0626   K 3.9 01/09/2012 1100   CL 102 01/21/2012 0626   CL 103 01/09/2012 1100   CO2 24 01/21/2012 0626   CO2 31 01/09/2012 1100   GLUCOSE 88 01/21/2012 0626   GLUCOSE 94 01/09/2012 1100   BUN 15 01/21/2012 0626   BUN 18 01/09/2012 1100   CREATININE 0.92 01/21/2012 0626   CREATININE 1.3* 01/09/2012 1100   CALCIUM 8.8 01/21/2012 0626   CALCIUM 9.6 01/09/2012 1100   PROT 6.7 01/20/2012 0625   ALBUMIN 2.8* 01/20/2012 0625   AST 26 01/20/2012 0625   ALT 27 01/20/2012 0625   ALKPHOS 111 01/20/2012 0625   BILITOT 0.6 01/20/2012 0625   GFRNONAA  80* 01/21/2012 0626   GFRAA >90 01/21/2012 0626   Lipase     Component Value Date/Time   LIPASE 13 01/17/2012 1645       Studies/Results: Dg Ercp With Sphincterotomy  01/22/2012  *RADIOLOGY REPORT*  Clinical Data: Recent cholecystectomy for cholecystitis with repair of common hepatic duct injury.  ERCP  Comparison:  Intraoperative cholangiogram on 01/19/2012.  Technique:  Multiple spot images obtained with the fluoroscopic device and submitted for interpretation post-procedure.  ERCP was performed by Dr. Arlyce Dice.  Findings: Imaging was obtained during the procedure with a C-arm. Submitted imaging shows initial cannulation of the pancreatic duct with placement of a small caliber pancreatic ductal stent.  After cannulation of the common bile duct, contrast injection shows no obvious bile leak.  A balloon sweep maneuver was performed and a biliary stent placed spanning across the entire length of the common bile duct and extending superiorly into the common hepatic duct.  IMPRESSION: Imaging during ERCP procedure with placement of both pancreatic ductal and CBD endoscopic stents.  No obvious bile leak was identified at the time of cholangiography.  These images were submitted for radiologic interpretation only. Please see the procedural report for the amount of contrast and the fluoroscopy time utilized.   Original  Report Authenticated By: Irish Lack, M.D.    Dg C-arm 61-120 Min-no Report  01/21/2012  CLINICAL DATA: possible stent placement   C-ARM 61-120 MINUTES  Fluoroscopy was utilized by the requesting physician.  No radiographic  interpretation.      Anti-infectives: Anti-infectives     Start     Dose/Rate Route Frequency Ordered Stop   01/18/12 0800   ciprofloxacin (CIPRO) IVPB 400 mg        400 mg 200 mL/hr over 60 Minutes Intravenous Every 12 hours 01/17/12 2351     01/18/12 0600   metroNIDAZOLE (FLAGYL) IVPB 500 mg        500 mg 100 mL/hr over 60 Minutes Intravenous Every 8 hours  01/17/12 2351     01/17/12 1915   ciprofloxacin (CIPRO) IVPB 400 mg        400 mg 200 mL/hr over 60 Minutes Intravenous  Once 01/17/12 1914 01/17/12 2156   01/17/12 1915   metroNIDAZOLE (FLAGYL) IVPB 500 mg        500 mg 100 mL/hr over 60 Minutes Intravenous  Once 01/17/12 1914 01/17/12 2033           Assessment/Plan 1. POD#4-lap chole: small pinhole in common hepatic duct that was repaired, drain with bilious appearing drainage. S/p ERCP 1/13 that did not show any obvious bile duct leak. Bile duct stent placed following pancreatic duct stent placement along with sphincterotomy.   --drain care and monitor drain output  --Heparin gtt. Consider restarting xarelto since procedures have taken place.  -- currently tolerating full liquid diet and consider advancing to soft mechanical later  today if continues to tolerate  -- colace for constipation  --  Follow up KUB   -- repeat ERCP in 6-8 weeks per GI  LOS: 6 days   Marena Chancy, PGY-2 Family Medicine Resident General Surgery Rot[place]

## 2012-01-23 NOTE — Progress Notes (Signed)
Physical therapy Note  SATURATION QUALIFICATIONS: (This note is used to comply with regulatory documentation for home oxygen)  Patient Saturations on Room Air at Rest = 88%  Patient Saturations on Room Air while Ambulating = 84%  Patient Saturations on 4 Liters of oxygen after Ambulating = 97%  Please briefly explain why patient needs home oxygen: Pt's O2 sats decr to unacceptable levels with activity on Room [address]  [place], [place] [phone]

## 2012-01-23 NOTE — Progress Notes (Signed)
[facility] PROGRESS NOTE  CHIKE FARRINGTON WRU:[id number] DOB: 1933/06/05 DOA: 01/17/2012 PCP: Letitia Libra, Ala Dach, MD  Brief History: Steve [name] is a 79 y.o. male who presents with c/o 3 day history of abdominal pain. Profound nausea and one episode of emesis. Pain is worst in the RUQ. Worse with eating especially a large meal, better without eating. Presented to the ED on 01/17/2012. In the ED patient was found to have acute cholecystitis.  Assessment/Plan: Acute cholecystitis s/p Laparoscopic cholecystectomy on 01/19/2012 Currently on Cipro/Flagyl. Management per General Surgery. GI consulted, for small pinhold in common hepatic duct that was repaired during surgery, for possible stent placement.  GI attempted ERCP on 01/21/2012 but was unsuccessful in placing a stent. GI on 01/22/2012 performed sphincterotomy and placed CBD and pancreatic duct stents for possible minimal bile duct leak. Will get a KUB to check stents and will need a ERCP in 6-8 weeks as outpatient. Advance diet as tolerated.  A.Fib Rate controlled, continue metoprolol. Resumed heparin drip. Transition to xeralto today.  Mesothelioma S/p VATS on 11/02/2011 and drain placement. Drain removed about 2 weeks ago per family. Has followed up with Dr. Myna Hidalgo on 11/29/2011 and had PET scan done which did not show any activity on the right side and no obvious metastatic disease. Conservative management was recommended.  Chronic respiratory failure On 3 L of oxygen at home for night time use.  History of DVT on 09/01/2011 in the right femoral vein, popliteal vein, and posterior tibial vein with Factor V Leiden mutation Anticoagulated on heparin drip, transition to xeralto after the procedure.  HTN  Continue metoprolol.  UTI  Very mild UTI, urine culture showed insignificant growth.  Patient already on cipro.  Depression Stable, continue sertraline.  COPD Not active at this time. Continue home  inhalers.  Hypokalemia Replace as needed.  Mildly elevated LFTs Likely due to acute cholecystitis.  Generalized weakeness PT evaluation pending.  Prophylaxis Heparin drip transition to Rivaroxaban prior to discharge.  Code Status: Full code Family Communication: Wife at bedside. Disposition Plan: Pending. Consider DC home in 1-2 days.   Consultants:  Dr. Magnus Ivan, General Surgery  Dr. Christella Hartigan, GI  Procedures:  None  Antibiotics:  Ciprofloxacin 01/17/2012 >>  Metronidazole 01/17/2012 >>  HPI/Subjective: No specific concerns. Feeling well, tolerating an oral diet today.  Objective: Filed Vitals:   01/22/12 1553 01/22/12 1644 01/22/12 2009 01/23/12 0355  BP:  150/59 150/63 112/65  Pulse: 69 66 72 54  Temp:  97.3 F (36.3 C) 97.4 F (36.3 C) 98.1 F (36.7 C)  TempSrc:   Oral Oral  Resp:  18 16 16   Height:      Weight:   88.6 kg (195 lb 5.2 oz)   SpO2: 95% 93% 93% 90%    Intake/Output Summary (Last 24 hours) at 01/23/12 0801 Last data filed at 01/23/12 0512  Gross per 24 hour  Intake    960 ml  Output   1590 ml  Net   -630 ml   Filed Weights   01/20/12 2210 01/21/12 2125 01/22/12 2009  Weight: 88.905 kg (196 lb) 87.091 kg (192 lb) 88.6 kg (195 lb 5.2 oz)    Exam: Physical Exam: General: Awake, Oriented, No acute distress. HEENT: EOMI. Neck: Supple CV: S1 and S2 Lungs: Clear to ascultation bilaterally Abdomen: Soft, nontender, Nondistended, +bowel sounds. Ext: Good pulses. Trace edema.  Data Reviewed: Basic Metabolic Panel:  Lab 01/21/12 9147 01/20/12 0625 01/19/12 0625 01/18/12 0500 01/17/12 1645  NA 136  137 136 135 138  K 3.8 3.9 3.5 3.1* 3.2*  CL 102 101 101 100 97  CO2 24 23 25 27 26   GLUCOSE 88 115* 91 94 130*  BUN 15 13 14 22 22   CREATININE 0.92 0.83 0.77 1.01 1.10  CALCIUM 8.8 8.8 8.7 8.9 9.8  MG -- -- 2.0 -- --  PHOS -- -- -- -- --   Liver Function Tests:  Lab 01/20/12 0625 01/19/12 0625 01/17/12 1645  AST 26 18 37  ALT 27 33  72*  ALKPHOS 111 128* 151*  BILITOT 0.6 0.7 0.8  PROT 6.7 6.6 7.9  ALBUMIN 2.8* 2.5* 3.0*    Lab 01/17/12 1645  LIPASE 13  AMYLASE --   No results found for this basename: AMMONIA:5 in the last 168 hours CBC:  Lab 01/21/12 0626 01/19/12 0625 01/18/12 0500 01/17/12 1645  WBC 9.1 7.6 11.5* 14.1*  NEUTROABS -- -- -- 11.6*  HGB 11.5* 12.2* 12.1* 14.5  HCT 35.2* 36.6* 36.7* 43.6  MCV 94.6 92.0 92.4 93.2  PLT 146* 139* 147* 150   Cardiac Enzymes: No results found for this basename: CKTOTAL:5,CKMB:5,CKMBINDEX:5,TROPONINI:5 in the last 168 hours BNP (last 3 results)  Basename 06/17/11 0918  PROBNP 919.3*   CBG:  Lab 01/23/12 0718 01/22/12 0736 01/20/12 0742 01/19/12 0734 01/18/12 0801  GLUCAP 81 90 107* 86 95    Recent Results (from the past 240 hour(s))  URINE CULTURE     Status: Normal   Collection Time   01/17/12  4:47 PM      Component Value Range Status Comment   Specimen Description URINE, CLEAN CATCH   Final    Special Requests NONE   Final    Culture  Setup Time 01/17/2012 19:08   Final    Colony Count 2,000 COLONIES/ML   Final    Culture INSIGNIFICANT GROWTH   Final    Report Status 01/18/2012 FINAL   Final   SURGICAL PCR SCREEN     Status: Normal   Collection Time   01/19/12  6:44 AM      Component Value Range Status Comment   MRSA, PCR NEGATIVE  NEGATIVE Final    Staphylococcus aureus NEGATIVE  NEGATIVE Final      Studies: Dg Ercp With Sphincterotomy  01/22/2012  *RADIOLOGY REPORT*  Clinical Data: Recent cholecystectomy for cholecystitis with repair of common hepatic duct injury.  ERCP  Comparison:  Intraoperative cholangiogram on 01/19/2012.  Technique:  Multiple spot images obtained with the fluoroscopic device and submitted for interpretation post-procedure.  ERCP was performed by Dr. Arlyce Dice.  Findings: Imaging was obtained during the procedure with a C-arm. Submitted imaging shows initial cannulation of the pancreatic duct with placement of a small caliber  pancreatic ductal stent.  After cannulation of the common bile duct, contrast injection shows no obvious bile leak.  A balloon sweep maneuver was performed and a biliary stent placed spanning across the entire length of the common bile duct and extending superiorly into the common hepatic duct.  IMPRESSION: Imaging during ERCP procedure with placement of both pancreatic ductal and CBD endoscopic stents.  No obvious bile leak was identified at the time of cholangiography.  These images were submitted for radiologic interpretation only. Please see the procedural report for the amount of contrast and the fluoroscopy time utilized.   Original Report Authenticated By: Irish Lack, M.D.    Dg C-arm 61-120 Min-no Report  01/21/2012  CLINICAL DATA: possible stent placement   C-ARM 61-120 MINUTES  Fluoroscopy  was utilized by the requesting physician.  No radiographic  interpretation.      Scheduled Meds:    . beta carotene w/minerals  1 tablet Oral Daily  . ciprofloxacin  400 mg Intravenous Q12H  . metoprolol tartrate  25 mg Oral BID  . metronidazole  500 mg Intravenous Q8H  . nicotine  14 mg Transdermal Daily  . senna  2 tablet Oral BID  . sertraline  50 mg Oral q morning - 10a  . tiotropium  18 mcg Inhalation Daily   Continuous Infusions:    . heparin 1,900 Units/hr (01/23/12 0446)  . lactated ringers 20 mL/hr at 01/22/12 1250    Principal Problem:  *Acute cholecystitis Active Problems:  COPD (chronic obstructive pulmonary disease)  Depression  Hypertension  Atrial fibrillation  History of DVT (deep vein thrombosis)  Hypokalemia  Bile duct injury   Abdirahman Chittum A  [facility] Pager [phone]. If 7PM-7AM, please contact night-coverage at [email or web address], password [facility] 01/23/2012, 8:01 AM  LOS: 6 days

## 2012-01-23 NOTE — Progress Notes (Signed)
Physical Therapy Evaluation Patient Details Name: Steve [name] MRN: [id number] DOB: [birth year] Today's Date: 01/23/2012 Time: 8413-2440 PT Time Calculation (min): 15 min  PT Assessment / Plan / Recommendation Clinical Impression  79 yo male admitted with acute cholecysititis, s/p ERCP presents with decr functional mobiltiy, O2 sats dropping with activity; Will benefit from PT to maximize independence and safety with mobility, and enable safe dc home; concern for fall risk; Highly recommend HHPT follow up -- pt states he has had HHPT in the past and would like to use them    PT Assessment  Patient needs continued PT services    Follow Up Recommendations  Home health PT;Supervision/Assistance - 24 hour    Does the patient have the potential to tolerate intense rehabilitation      Barriers to Discharge None      Equipment Recommendations  None recommended by PT    Recommendations for Other Services     Frequency Min 3X/week    Precautions / Restrictions Precautions Precautions: Fall Precaution Comments: Pt apparently with multiple falls at home in the past year   Pertinent Vitals/Pain See other PT note of this date      Mobility  Bed Mobility Bed Mobility: Supine to Sit;Sitting - Scoot to Edge of Bed Supine to Sit: 4: Min guard;With rails;HOB elevated Sitting - Scoot to Edge of Bed: 4: Min guard Details for Bed Mobility Assistance: pretty smooth transition, though slow moving; no need for physical assist Transfers Transfers: Sit to Stand;Stand to Sit Sit to Stand: 4: Min guard;From bed;With upper extremity assist Stand to Sit: 4: Min guard;To bed;With upper extremity assist Details for Transfer Assistance: Cues for technique, safety, hand placement; cues alos to scoot to EOB for greater ease with sit to stand Ambulation/Gait Ambulation/Gait Assistance: 4: Min guard;4: Min assist Ambulation Distance (Feet): 40 Feet Assistive device: Rolling walker Ambulation/Gait  Assistance Details: cues for Rw proximity, safety, posture; difficulty with keeping feet inside RW with turning, requiring min physical assist for RW management    Shoulder Instructions     Exercises     PT Diagnosis: Difficulty walking;Generalized weakness  PT Problem List: Decreased strength;Decreased activity tolerance;Decreased balance;Decreased mobility;Decreased knowledge of use of DME;Decreased safety awareness;Cardiopulmonary status limiting activity PT Treatment Interventions: DME instruction;Gait training;Stair training;Functional mobility training;Therapeutic activities;Therapeutic exercise;Balance training;Patient/family education   PT Goals Acute Rehab PT Goals PT Goal Formulation: With patient Time For Goal Achievement: 02/06/12 Potential to Achieve Goals: Good Pt will go Supine/Side to Sit: with modified independence PT Goal: Supine/Side to Sit - Progress: Goal set today Pt will go Sit to Supine/Side: with modified independence PT Goal: Sit to Supine/Side - Progress: Goal set today Pt will go Sit to Stand: with modified independence PT Goal: Sit to Stand - Progress: Goal set today Pt will go Stand to Sit: with modified independence PT Goal: Stand to Sit - Progress: Goal set today Pt will Ambulate: >150 feet;with supervision;with rolling walker PT Goal: Ambulate - Progress: Goal set today Pt will Go Up / Down Stairs: 3-5 stairs;with min assist;with rail(s) PT Goal: Up/Down Stairs - Progress: Goal set today  Visit Information  Last PT Received On: 01/23/12 Assistance Needed: +1    Subjective Data  Subjective: Agreeable to amb Patient Stated Goal: home soon   Prior Functioning  Home Living Lives With: Spouse Available Help at Discharge: Family;Available 24 hours/day Type of Home: Mobile home Home Access: Stairs to enter Entrance Stairs-Number of Steps: 4 Entrance Stairs-Rails: Right;Left;Can reach both Home Layout: One  level Bathroom Shower/Tub: Conservator, museum/gallery: Shower chair with back;Hand-held shower hose;Walker - four wheeled Prior Function Level of Independence: Independent with assistive device(s) Able to Take Stairs?: Yes Communication Communication: No difficulties    Cognition  Overall Cognitive Status: Appears within functional limits for tasks assessed/performed Arousal/Alertness: Awake/alert Orientation Level: Appears intact for tasks assessed Behavior During Session: [facility] for tasks performed    Extremity/Trunk Assessment Right Upper Extremity Assessment RUE ROM/Strength/Tone: [facility] for tasks assessed Left Upper Extremity Assessment LUE ROM/Strength/Tone: WFL for tasks assessed Right Lower Extremity Assessment RLE ROM/Strength/Tone: Deficits RLE ROM/Strength/Tone Deficits: Generalized weakness, with need for UE push for successful sit to stand Left Lower Extremity Assessment LLE ROM/Strength/Tone: Deficits LLE ROM/Strength/Tone Deficits: Generally weak   Balance    End of Session PT - End of Session Activity Tolerance: Patient tolerated treatment well;Other (comment) (though limited by decr O2 sats on Room Air) Patient left: in bed;with call bell/phone within reach Nurse Communication: Mobility status;Other (comment) (O2 sats fall during amb on Room Air)  GP     Van Clines [place], [place] [phone]  01/23/2012, 11:34 AM

## 2012-01-23 NOTE — Progress Notes (Signed)
ANTICOAGULATION CONSULT NOTE - Follow Up Consult  Pharmacy Consult for Heparin Indication:  HX DVT/factor V mutation/Afib  Allergies  Allergen Reactions  . Lyrica (Pregabalin) Hives and Itching    Patient Measurements: Height: 5\' 9"  (175.3 cm) Weight: 195 lb 5.2 oz (88.6 kg) IBW/kg (Calculated) : 70.7  Heparin Dosing Weight: 87 kg  Vital Signs: Temp: 97.4 F (36.3 C) (01/14 1300) Temp src: Oral (01/14 1300) BP: 108/60 mmHg (01/14 1300) Pulse Rate: 64  (01/14 1300)  Labs:  Basename 01/23/12 0946 01/23/12 0940 01/21/12 1837 01/21/12 0626 01/20/12 1848  HGB 10.7* -- -- 11.5* --  HCT 32.6* -- -- 35.2* --  PLT 186 -- -- 146* --  APTT -- -- -- -- --  LABPROT -- -- -- -- --  INR -- -- -- -- --  HEPARINUNFRC -- <0.10* 0.24* -- 0.17*  CREATININE 0.74 -- -- 0.92 --  CKTOTAL -- -- -- -- --  CKMB -- -- -- -- --  TROPONINI -- -- -- -- --    Estimated Creatinine Clearance: 86.6 ml/min (by C-G formula based on Cr of 0.74).   Assessment: 79 y.o M who has been bridged on heparin for hx DVT/factor V leiden/Afib this admission while home Xarelto on hold. The patient had an unsuccessful ERCP procedure on 1/12 and was taken back to the OR 1/13, with successful ERCP.  Heparin was resumed about 12 hours s/p ERCP.  Initial heparin level undetectable on 1900 units/hr.  Confirmed with RN that heparin was running without any issues.  No bleeding or complications noted per chart notes.  Goal of Therapy:  Heparin level 0.3-0.7 units/ml Monitor platelets by anticoagulation protocol: Yes   Plan:  1. Increase IV heparin gtt to 2100 units/hr.  No bolus given recent procedure. 2. Check heparin level in 6 hrs. 3. Resume Xarelto soon? (recommended to give dose of Xarelto at the time of heparin discontinuation) 4. Daily heparin level and CBC while he remains on heparin.  Tad Moore, BCPS  Clinical Pharmacist Pager [phone]  01/23/2012 1:51 PM

## 2012-01-23 NOTE — Progress Notes (Signed)
Pt complaining of 2-5 seconds of sharp, stinging pain in chest. States occurred earlier today also. Pt was laying in bed, asymptomatic, NAD, skin warm and dry. States it felt like "bee sting." No redness to area on chest. No obvious change on telemetry strip. VSS. MD made aware, will continue to monitor.

## 2012-01-23 NOTE — Progress Notes (Signed)
Utilization review completed.  

## 2012-01-24 ENCOUNTER — Encounter (HOSPITAL_COMMUNITY)
Admission: EM | Disposition: A | Discharge status: Home / Self Care | Payer: Self-pay | Source: Home / Self Care | Attending: Internal Medicine

## 2012-01-24 ENCOUNTER — Encounter: Payer: Self-pay | Admitting: Gastroenterology

## 2012-01-24 ENCOUNTER — Encounter (HOSPITAL_COMMUNITY): Payer: Self-pay | Admitting: Gastroenterology

## 2012-01-24 DIAGNOSIS — E876 Hypokalemia: Secondary | ICD-10-CM

## 2012-01-24 DIAGNOSIS — I82409 Acute embolism and thrombosis of unspecified deep veins of unspecified lower extremity: Secondary | ICD-10-CM

## 2012-01-24 HISTORY — PX: ESOPHAGOGASTRODUODENOSCOPY: SHX5428

## 2012-01-24 LAB — GLUCOSE, CAPILLARY: Glucose-Capillary: 79 mg/dL (ref 70–99)

## 2012-01-24 SURGERY — EGD (ESOPHAGOGASTRODUODENOSCOPY)
Anesthesia: Moderate Sedation

## 2012-01-24 MED ORDER — BUTAMBEN-TETRACAINE-BENZOCAINE 2-2-14 % EX AERO
INHALATION_SPRAY | CUTANEOUS | Status: DC | PRN
  Administered 2012-01-24: 2 via TOPICAL

## 2012-01-24 MED ORDER — GLYCOPYRROLATE 0.2 MG/ML IJ SOLN
INTRAMUSCULAR | Status: AC
  Filled 2012-01-24: qty 1

## 2012-01-24 MED ORDER — SODIUM CHLORIDE 0.45 % IV SOLN: INTRAVENOUS | Status: DC

## 2012-01-24 MED ORDER — GLYCOPYRROLATE 0.2 MG/ML IJ SOLN
INTRAMUSCULAR | Status: DC | PRN
  Administered 2012-01-24: 0.2 mg via INTRAVENOUS

## 2012-01-24 MED ORDER — OXYCODONE HCL 5 MG PO TABS: 5.0000 mg | ORAL_TABLET | ORAL | Status: DC | PRN

## 2012-01-24 MED ORDER — FENTANYL CITRATE 0.05 MG/ML IJ SOLN
INTRAMUSCULAR | Status: AC
  Filled 2012-01-24: qty 4

## 2012-01-24 MED ORDER — PROMETHAZINE HCL 12.5 MG PO TABS: 12.5000 mg | ORAL_TABLET | Freq: Four times a day (QID) | ORAL | Status: DC | PRN

## 2012-01-24 MED ORDER — MIDAZOLAM HCL 5 MG/5ML IJ SOLN
INTRAMUSCULAR | Status: DC | PRN
  Administered 2012-01-24: 2.5 mg via INTRAVENOUS

## 2012-01-24 MED ORDER — MIDAZOLAM HCL 5 MG/ML IJ SOLN
INTRAMUSCULAR | Status: AC
  Filled 2012-01-24: qty 3

## 2012-01-24 MED ORDER — FENTANYL CITRATE 0.05 MG/ML IJ SOLN
INTRAMUSCULAR | Status: DC | PRN
  Administered 2012-01-24: 25 ug via INTRAVENOUS

## 2012-01-24 NOTE — Op Note (Signed)
Steve [name] [facility] [address] [place], [address]   ENDOSCOPY PROCEDURE REPORT  PATIENT: [name], [name]  MR#: [id number] BIRTHDATE: 01/19/1933 , 78  yrs. old GENDER: Male ENDOSCOPIST: Louis Meckel, MD REFERRED BY: PROCEDURE DATE:  01/24/2012 PROCEDURE:  EGD, diagnostic ASA CLASS:     Class II INDICATIONS:  Foreign body removal; abdominal x-ray demonstrates retention of a pancreatic stent MEDICATIONS: MAC sedation, administered by CRNA, Versed-Detailed 2.5 mg IV, and Fentanyl 25 mcg IV TOPICAL ANESTHETIC: Cetacaine Spray  DESCRIPTION OF PROCEDURE: After the risks benefits and alternatives of the procedure were thoroughly explained, informed consent was obtained.  The [id number] ([id number]) and [id number] ([id number]) endoscope was introduced through the mouth and advanced to the third portion of the duodenum. Without limitations.  The instrument was slowly withdrawn as the mucosa was fully examined.      Biliary stent in place.  No other stents or foreign bodies are seen. Biliary stent in place.  No other stents or foreign bodies are seen.   The remainder of the upper endoscopy exam was otherwise normal.  Retroflexed views revealed no abnormalities.     The scope was then withdrawn from the patient and the procedure completed.  COMPLICATIONS: There were no complications. ENDOSCOPIC IMPRESSION: 1.   Biliary stent in place.  No other stents or foreign bodies are seen.  Spontaneous passage of the stent since prior x-ray  RECOMMENDATIONS: followup ERCP in 6-8 REPEAT EXAM:  eSigned:  Louis Meckel, MD 01/24/2012 4:38 PM   CC:

## 2012-01-24 NOTE — Interval H&P Note (Signed)
History and Physical Interval Note:  01/24/2012 4:17 PM  Steve [name]  has presented today for surgery, with the diagnosis of intact pancreatic duct stent  The various methods of treatment have been discussed with the patient and family. After consideration of risks, benefits and other options for treatment, the patient has consented to  Procedure(s) (LRB) with comments: ESOPHAGOGASTRODUODENOSCOPY (EGD) (N/A) - remove pancr stent  as a surgical intervention .  The patient's history has been reviewed, patient examined, no change in status, stable for surgery.  I have reviewed the patient's chart and labs.  Questions were answered to the patient's satisfaction.    The recent H&P (dated *01/23/12 **) was reviewed, the patient was examined and there is no change in the patients condition since that H&P was completed.   [name]  01/24/2012, 4:17 PM    [name]

## 2012-01-24 NOTE — H&P (View-Only) (Signed)
     [facility] Daily Rounding Note 01/23/2012, 9:42 AM  SUBJECTIVE:       r abdominal pain overnight, relieved with Morphine.  No recurrent pain.  No n/v. Tolerating FL diet.  Would love to eat a steak. Last BM was one week ago, has been getting Senna po twice daily,  colace ordered this AM..  Day 6 Cipro and Flagyl.   OBJECTIVE:         Vital signs in last 24 hours:    Temp:  [97.3 F (36.3 C)-98.1 F (36.7 C)] 97.3 F (36.3 C) (01/14 0918) Pulse Rate:  [54-82] 60  (01/14 0918) Resp:  [16-18] 18  (01/14 0918) BP: (103-160)/(59-66) 103/59 mmHg (01/14 0918) SpO2:  [90 %-98 %] 92 % (01/14 0918) Weight:  [88.6 kg (195 lb 5.2 oz)] 88.6 kg (195 lb 5.2 oz) (01/13 2009) Last BM Date: 01/17/12 General: looks better, not ill.   Heart: RRR Chest: clear B Abdomen: soft, tender in RuQ:  Mild.  No blood on bandages.  Pale bilious, yellow, non bloody JP drainage.   Extremities: no pedal edema Neuro/Psych:  Relaxed, not confused.   Intake/Output from previous day: 01/13 0701 - 01/14 0700 In: 960 [P.O.:10; I.V.:950] Out: 1590 [Urine:1200; Drains:390]  Intake/Output this shift: Total I/O In: 360 [P.O.:360] Out: 10 [Drains:10]  Lab Results:  [facility] 01/21/12 0626  WBC 9.1  HGB 11.5*  HCT 35.2*  PLT 146*   BMET  Basename 01/21/12 0626  NA 136  K 3.8  CL 102  CO2 24  GLUCOSE 88  BUN 15  CREATININE 0.92  CALCIUM 8.8   Studies/Results: Dg Ercp With Sphincterotomy 01/22/2012    IMPRESSION: Imaging during ERCP procedure with placement of both pancreatic ductal and CBD endoscopic stents.  No obvious bile leak was identified at the time of cholangiography.  These images were submitted for radiologic interpretation only. Please see the procedural report for the amount of contrast and the fluoroscopy time utilized.   Original Report Authenticated By: Irish Lack, M.D.     ASSESMENT: * Lap chole , repair of small perf in CHD 01/19/11 *  Unsuccessful ERCP 1/12    Successful ERCP,  sphincterotomy with placement of stents to CBD and PD fo possible minimal bile duct leak.   * DVT hx.  Xarelto PTA,  On hold now.  Heparin drip in place.  *  Will advance diet to heart healthy diet.    PLAN: *  f/u KUB prior to discharge checking to see if pt passed pancreatic  stent  f/u ERCP in 6-8 weeks (per Dr. Christella Hartigan) *  Restart Xarelto? *  Add prn Dulcolax PR in case colace does not work. *  How long to continue Abx?     LOS: 6 days   Jennye Moccasin  01/23/2012, 9:42 AM Pager: [phone] I have personally taken an interval history, reviewed the chart, and examined the patient.  I agree with the extender's note, impression and recommendations.  May restart xarelto at time of discharge.  Barbette Hair. Arlyce Dice, MD, [facility] [phone]

## 2012-01-24 NOTE — Progress Notes (Signed)
[facility] educaitonal video viewed by pt's wife. Pt declined to watch video. [name], [name]

## 2012-01-24 NOTE — Progress Notes (Signed)
AVS reviewed with pt and wife at bedside; teach back method used. Pt and wife verbalized understanding of AVS and questions were answered. IVs and tele removed. Rx'sgiven. [name], [name]

## 2012-01-24 NOTE — Progress Notes (Signed)
Patient ID: Steve [name], male   DOB: 02/06/1933, 79 y.o.   MRN: [id number] 2 Days Post-Op  Subjective: Pt feels well.  Tolerated solid diet this morning.  It appears his pancreatic stent has passed on his x-ray  Objective: Vital signs in last 24 hours: Temp:  [97.3 F (36.3 C)-98.7 F (37.1 C)] 97.8 F (36.6 C) (01/15 0511) Pulse Rate:  [55-64] 55  (01/15 0511) Resp:  [18-20] 18  (01/15 0511) BP: (103-153)/(59-62) 153/62 mmHg (01/15 0511) SpO2:  [92 %-96 %] 92 % (01/15 0511) Weight:  [196 lb 3.4 oz (89 kg)] 196 lb 3.4 oz (89 kg) (01/14 2130) Last BM Date: 01/17/12  Intake/Output from previous day: 01/14 0701 - 01/15 0700 In: 1780.6 [P.O.:780; I.V.:298.6; IV Piggyback:702] Out: 1525 [Urine:1375; Drains:150] Intake/Output this shift:    PE: Abd: soft, minimally tender, drain looks mostly serous today, no frank evidence of bile, +BS, ND, incisions c/d/i  Lab Results:   Basename 01/23/12 0946  WBC 9.8  HGB 10.7*  HCT 32.6*  PLT 186   BMET  Basename 01/23/12 0946  NA 139  K 3.3*  CL 102  CO2 27  GLUCOSE 89  BUN 9  CREATININE 0.74  CALCIUM 8.6   PT/INR No results found for this basename: LABPROT:2,INR:2 in the last 72 hours CMP     Component Value Date/Time   NA 139 01/23/2012 0946   NA 143 01/09/2012 1100   K 3.3* 01/23/2012 0946   K 3.9 01/09/2012 1100   CL 102 01/23/2012 0946   CL 103 01/09/2012 1100   CO2 27 01/23/2012 0946   CO2 31 01/09/2012 1100   GLUCOSE 89 01/23/2012 0946   GLUCOSE 94 01/09/2012 1100   BUN 9 01/23/2012 0946   BUN 18 01/09/2012 1100   CREATININE 0.74 01/23/2012 0946   CREATININE 1.3* 01/09/2012 1100   CALCIUM 8.6 01/23/2012 0946   CALCIUM 9.6 01/09/2012 1100   PROT 6.7 01/20/2012 0625   ALBUMIN 2.8* 01/20/2012 0625   AST 26 01/20/2012 0625   ALT 27 01/20/2012 0625   ALKPHOS 111 01/20/2012 0625   BILITOT 0.6 01/20/2012 0625   GFRNONAA 87* 01/23/2012 0946   GFRAA >90 01/23/2012 0946   Lipase     Component Value Date/Time   LIPASE 13  01/17/2012 1645       Studies/Results: Dg Ercp With Sphincterotomy  01/22/2012  *RADIOLOGY REPORT*  Clinical Data: Recent cholecystectomy for cholecystitis with repair of common hepatic duct injury.  ERCP  Comparison:  Intraoperative cholangiogram on 01/19/2012.  Technique:  Multiple spot images obtained with the fluoroscopic device and submitted for interpretation post-procedure.  ERCP was performed by Dr. Arlyce Dice.  Findings: Imaging was obtained during the procedure with a C-arm. Submitted imaging shows initial cannulation of the pancreatic duct with placement of a small caliber pancreatic ductal stent.  After cannulation of the common bile duct, contrast injection shows no obvious bile leak.  A balloon sweep maneuver was performed and a biliary stent placed spanning across the entire length of the common bile duct and extending superiorly into the common hepatic duct.  IMPRESSION: Imaging during ERCP procedure with placement of both pancreatic ductal and CBD endoscopic stents.  No obvious bile leak was identified at the time of cholangiography.  These images were submitted for radiologic interpretation only. Please see the procedural report for the amount of contrast and the fluoroscopy time utilized.   Original Report Authenticated By: Irish Lack, M.D.    Dg Abd Portable 1v  01/23/2012  *  RADIOLOGY REPORT*  Clinical Data: With the stent, evaluate placement  PORTABLE ABDOMEN - 1 VIEW  Comparison: ERCP 01/22/2012  Findings: CBD stent identified in the right upper quadrant in expected position. Nonobstructive bowel gas pattern. Surgical clips right upper quadrant post cholecystectomy. Question right upper quadrant surgical drain. Bones appear demineralized.  IMPRESSION: Normal bowel gas pattern. CBD stent identified in the right upper quadrant in expected position.   Original Report Authenticated By: Ulyses Southward, M.D.     Anti-infectives: Anti-infectives     Start     Dose/Rate Route Frequency  Ordered Stop   01/18/12 0800   ciprofloxacin (CIPRO) IVPB 400 mg        400 mg 200 mL/hr over 60 Minutes Intravenous Every 12 hours 01/17/12 2351     01/18/12 0600   metroNIDAZOLE (FLAGYL) IVPB 500 mg        500 mg 100 mL/hr over 60 Minutes Intravenous Every 8 hours 01/17/12 2351     01/17/12 1915   ciprofloxacin (CIPRO) IVPB 400 mg        400 mg 200 mL/hr over 60 Minutes Intravenous  Once 01/17/12 1914 01/17/12 2156   01/17/12 1915   metroNIDAZOLE (FLAGYL) IVPB 500 mg        500 mg 100 mL/hr over 60 Minutes Intravenous  Once 01/17/12 1914 01/17/12 2033           Assessment/Plan  1. S/p lap chole, ? Hepatic duct leak, s/p ERCP with stent placement  Plan: 1. It appears the pancreatic duct stent has passed based off of his x-rays.  He is tolerating a solid diet.  He is surgically stable for dc home when he is medically stable. Drain will stay in place 2. He should follow up with Dr. [name] in 2 weeks and with Dr. Christella Hartigan in 6-8 weeks for repeat ERCP.    LOS: 7 days    Brylea Pita E 01/24/2012, 8:47 AM Pager: [phone]

## 2012-01-24 NOTE — Progress Notes (Signed)
[facility] Progress Note  SUBJECTIVE: feels okay, eating lunch  OBJECTIVE:  Vital signs in last 24 hours: Temp:  [97.3 F (36.3 C)-98.7 F (37.1 C)] 97.3 F (36.3 C) (01/15 1021) Pulse Rate:  [55-64] 55  (01/15 1021) Resp:  [18-20] 18  (01/15 1021) BP: (108-153)/(60-74) 138/74 mmHg (01/15 1021) SpO2:  [92 %-96 %] 96 % (01/15 1021) Weight:  [196 lb 3.4 oz (89 kg)] 196 lb 3.4 oz (89 kg) (01/14 2130) Last BM Date: 01/17/12 General:   Pleasant white male in NAD Abdomen:  Soft, nontender and nondistended. Normal bowel sounds. Neurologic:  Alert and oriented,  grossly normal neurologically. Psych:  Cooperative. Normal mood and affect.   Lab Results:  [facility] 01/23/12 0946  WBC 9.8  HGB 10.7*  HCT 32.6*  PLT 186   BMET  Basename 01/23/12 0946  NA 139  K 3.3*  CL 102  CO2 27  GLUCOSE 89  BUN 9  CREATININE 0.74  CALCIUM 8.6    Studies/Results: Dg Ercp With Sphincterotomy  01/22/2012  *RADIOLOGY REPORT*  Clinical Data: Recent cholecystectomy for cholecystitis with repair of common hepatic duct injury.  ERCP  Comparison:  Intraoperative cholangiogram on 01/19/2012.  Technique:  Multiple spot images obtained with the fluoroscopic device and submitted for interpretation post-procedure.  ERCP was performed by Dr. Arlyce Dice.  Findings: Imaging was obtained during the procedure with a C-arm. Submitted imaging shows initial cannulation of the pancreatic duct with placement of a small caliber pancreatic ductal stent.  After cannulation of the common bile duct, contrast injection shows no obvious bile leak.  A balloon sweep maneuver was performed and a biliary stent placed spanning across the entire length of the common bile duct and extending superiorly into the common hepatic duct.  IMPRESSION: Imaging during ERCP procedure with placement of both pancreatic ductal and CBD endoscopic stents.  No obvious bile leak was identified at the time of cholangiography.  These images  were submitted for radiologic interpretation only. Please see the procedural report for the amount of contrast and the fluoroscopy time utilized.   Original Report Authenticated By: Irish Lack, M.D.    Dg Abd Portable 1v  01/23/2012  *RADIOLOGY REPORT*  Clinical Data: With the stent, evaluate placement  PORTABLE ABDOMEN - 1 VIEW  Comparison: ERCP 01/22/2012  Findings: CBD stent identified in the right upper quadrant in expected position. Nonobstructive bowel gas pattern. Surgical clips right upper quadrant post cholecystectomy. Question right upper quadrant surgical drain. Bones appear demineralized.  IMPRESSION: Normal bowel gas pattern. CBD stent identified in the right upper quadrant in expected position.   Original Report Authenticated By: Ulyses Southward, M.D.      ASSESSMENT / PLAN:  Possible post cholecystectomy.common bile duct leak, s/p ERCP, sphincterotomy with CBD and PD stent placement 01/22/12.Marland Kitchen No obvious leak seen at time of ERCP. KUB yesterday didn't mention PD stent (already passed?). I asked Dr. Arlyce Dice to review film to be sure. Other than that patient will need repeat ERCP with CBD stent removal in a few weeks. Will make him a follow appointment with Dr. Christella Hartigan to discuss.     LOS: 7 days   Willette Cluster  01/24/2012, 12:38 PM   Addendum:  I asked Dr. Arlyce Dice to review yesterday's KUB, he saw that pancreatic duct stent was still in place. I arranged for EGD with PD stent removal today around 4pm.  Willette Cluster, NP-c

## 2012-01-24 NOTE — Progress Notes (Signed)
Discharge on hold until pt has EGD to remove pancreatic stent. Pt informed by GI NP. [name], [name]

## 2012-01-24 NOTE — Care Management Note (Addendum)
   CARE MANAGEMENT NOTE 01/24/2012  Patient:  Steve [name], Steve [name]   Account Number:  [id number]  Date Initiated:  01/23/2012  Documentation initiated by:  Darlyne Russian  Subjective/Objective Assessment:   Patient admitted with abdominal pain  1/14 Order for HHPT     Action/Plan:   Progresssion of care and discharge planning  1/14 Met with pt and pt given list of [facility] providers , he will discuss with wife and CM will followup tomorrow.   Anticipated DC Date:  01/24/2012   Anticipated DC Plan:  HOME W HOME HEALTH SERVICES         [facility] Choice  HOME HEALTH   Choice offered to / List presented to:  C-1 Patient      DME agency  [facility]     [facility] arranged  HH-2 PT      Status of service:  Completed, signed off Medicare Important Message given?   (If response is "NO", the following Medicare IM given date fields will be blank) Date Medicare IM given:   Date Additional Medicare IM given:    Discharge Disposition:    Per UR Regulation:  Reviewed for med. necessity/level of care/duration of stay  If discussed at Long Length of Stay Meetings, dates discussed:    Comments:  01/24/2012 Met with pt and wife who selected [facility] for [facility] needs. [facility] notified and services will begin 1-2 days post d/c. Johny Shock RN MPH Case manager [phone]   Per pt he has Oxygen at home which he uses at night and as needed at 3l/min. Pt also has tank in the car for use on transfer to home.  CRoyal RN MPH

## 2012-01-24 NOTE — Discharge Summary (Addendum)
Physician Discharge Summary  Patient ID: Steve [name] MRN: [id number] DOB/AGE: June 17, 1933 79 y.o.  Admit date: 01/17/2012 Discharge date: 01/24/2012  Primary Care Physician:  Letitia Libra, Ala Dach, MD  Discharge Diagnoses:    . Acute cholecystitis status post laparoscopic cholecystectomy  . Hepatic duct leak status post ERCP with stent placement  . COPD (chronic obstructive pulmonary disease) . Depression . Hypertension . Atrial fibrillation . Hypokalemia  Consults:  General surgery, Dr. Violeta Gelinas                    Gastroenterology, Dr. Christella Hartigan   Discharge Medications:   Medication List     As of 01/24/2012 10:26 AM    TAKE these medications         albuterol 108 (90 BASE) MCG/ACT inhaler   Commonly known as: PROVENTIL HFA;VENTOLIN HFA   Inhale 2 puffs into the lungs every 6 (six) hours as needed for wheezing.      ALPRAZolam 0.25 MG tablet   Commonly known as: XANAX   Take 0.25 mg by mouth at bedtime.      metoprolol tartrate 25 MG tablet   Commonly known as: LOPRESSOR   TAKE ONE TABLET BY MOUTH TWICE DAILY      oxyCODONE 5 MG immediate release tablet   Commonly known as: Oxy IR/ROXICODONE   Take 1-2 tablets (5-10 mg total) by mouth every 4 (four) hours as needed for pain (pain).      promethazine 12.5 MG tablet   Commonly known as: PHENERGAN   Take 1 tablet (12.5 mg total) by mouth every 6 (six) hours as needed for nausea.      Rivaroxaban 20 MG Tabs   Commonly known as: XARELTO   Take 1 tablet (20 mg total) by mouth every morning.      senna 8.6 MG Tabs   Commonly known as: SENOKOT   Take 2 tablets by mouth at bedtime.      sertraline 50 MG tablet   Commonly known as: ZOLOFT   Take 50 mg by mouth every morning.      SPIRIVA HANDIHALER 18 MCG inhalation capsule   Generic drug: tiotropium   INHALE ONE DOSE BY MOUTH EVERY DAY         Brief H and P: For complete details please refer to admission H and P, but in brief Steve [name] is a 79  y.o. male who presented with c/o 3 day history of abdominal pain, profound nausea and one episode of emesis. Pain was worst in the RUQ, worse with eating especially a large meal, better without eating.  In the ED patient was found to have acute cholecystitis.  Hospital Course:  Acute cholecystitis s/p Laparoscopic cholecystectomy on 01/19/2012  General surgery was consulted and and patient underwent laparoscopic cholecystectomy with intraoperative cholangiogram, repair of common hepatic duct. Patient was placed on IV ciprofloxacin and Flagyl. Gastroenterology was consulted for ERCP and  for small pinhold in common hepatic duct that was repaired during surgery, for possible stent placement. GI attempted ERCP on 01/21/2012 but was unsuccessful in placing a stent. GI on 01/22/2012 performed sphincterotomy and placed CBD and pancreatic duct stents for possible minimal bile duct leak. Per surgery, KUB obtained to check stents and appears his pancreatic stent was in place per GI.  Patient underwent EGD today which showed biliary stent in place, no other foreign bodies are seen. He is currently tolerating solid diet. Discharge instructions about to drain were explained by general  surgery prior to DC. Patient was cleared by surgery and GI for discharge.    A.Fib : Rate controlled, continue metoprolol. Patient was placed on heparin drip and transitioned to xarelto on 01/23/2012.   Mesothelioma  S/p VATS on 11/02/2011 and drain placement. Drain removed about 2 weeks ago per family. Has followed up with Dr. Myna Hidalgo on 11/29/2011 and had PET scan done which did not show any activity on the right side and no obvious metastatic disease. Conservative management was recommended.   Chronic respiratory failure: On 3 L of oxygen at home for night time use, currently increased to 4 L.   History of DVT on 09/01/2011 in the right femoral vein, popliteal vein, and posterior tibial vein with Factor V Leiden mutation:  anticoagulated on heparin drip during perioperative period, now transitioned to xarelto  HTN  Continue metoprolol.   UTI :Very mild UTI, urine culture showed insignificant growth. Patient was on ciprofloxacin for a week.   Depression; Stable, continue sertraline.   COPD: Not active at this time. Continue home inhalers.   Mildly elevated LFTs: Likely due to acute cholecystitis.    Day of Discharge BP 138/74  Pulse 55  Temp 97.3 F (36.3 C) (Oral)  Resp 18  Ht 5\' 9"  (1.753 m)  Wt 89 kg (196 lb 3.4 oz)  BMI 28.98 kg/m2  SpO2 96%  Physical Exam: General: Alert and awake oriented x3 not in any acute distress. HEENT: anicteric sclera, pupils reactive to light and accommodation CVS: S1-S2 clear no murmur rubs or gallops Chest: clear to auscultation bilaterally, no wheezing rales or rhonchi Abdomen: soft minimally tender, drain+, incision clean Extremities: no cyanosis, clubbing or edema noted bilaterally Neuro: Cranial nerves II-XII intact, no focal neurological deficits   The results of significant diagnostics from this hospitalization (including imaging, microbiology, ancillary and laboratory) are listed below for reference.    LAB RESULTS: Basic Metabolic Panel:  Lab 01/23/12 1478 01/21/12 0626 01/19/12 0625  NA 139 136 --  K 3.3* 3.8 --  CL 102 102 --  CO2 27 24 --  GLUCOSE 89 88 --  BUN 9 15 --  CREATININE 0.74 0.92 --  CALCIUM 8.6 8.8 --  MG -- -- 2.0  PHOS -- -- --   Liver Function Tests:  Lab 01/20/12 0625 01/19/12 0625  AST 26 18  ALT 27 33  ALKPHOS 111 128*  BILITOT 0.6 0.7  PROT 6.7 6.6  ALBUMIN 2.8* 2.5*    Lab 01/17/12 1645  LIPASE 13  AMYLASE --   CBC:  Lab 01/23/12 0946 01/21/12 0626 01/17/12 1645  WBC 9.8 9.1 --  NEUTROABS -- -- 11.6*  HGB 10.7* 11.5* --  HCT 32.6* 35.2* --  MCV 93.7 -- --  PLT 186 146* --   CBG:  Lab 01/24/12 0745 01/23/12 0718  GLUCAP 79 81    Significant Diagnostic Studies:  Ct Abdomen Pelvis Wo  Contrast  01/17/2012  *RADIOLOGY REPORT*  Clinical Data: Right lower quadrant pain, hematuria  CT ABDOMEN AND PELVIS WITHOUT CONTRAST  Technique:  Multidetector CT imaging of the abdomen and pelvis was performed following the standard protocol without intravenous contrast.  Comparison: 09/20/2011  Findings: There is small right pleural effusion with right base posteriorly atelectasis or infiltrate.  Sagittal images of the spine is stable compression fracture of the L1 vertebral body.  Enhanced liver shows no biliary ductal dilatation.  There is a distended gallbladder.  There is thickening of the gallbladder wall.  There is significant stranding of  pericholecystic fat. Findings are consistent with acute cholecystitis.  No calcified gallstones are noted within gallbladder. The pancreas and spleen are unremarkable.  A tiny nodule left adrenal gland is stable. Right adrenal gland is unremarkable.  Stable probable cyst in the upper pole of the kidney.  Atherosclerotic calcifications of the abdominal aorta and the iliac arteries.  Unenhanced kidneys are symmetrical in size.  No nephrolithiasis.  No hydronephrosis or hydroureter.  No pericecal inflammation.  Normal appendix is partially visualized in axial image 63.  The urinary bladder is unremarkable.  Probable prior TURP.  Gas noted within rectum.  No distal colonic obstruction.  IMPRESSION:  1.  There is distended gallbladder with marked thickened wall and stranding of the surrounding fat.  Findings are consistent with acute cholecystitis.  Further evaluation with gallbladder ultrasound is recommended. 2.  No nephrolithiasis.  No hydronephrosis or hydroureter. 3.  Stable cyst in upper pole of the right kidney. 4.  No pericecal inflammation.  Normal appendix partially visualized. 5.  There is small right pleural effusion with right base posterior atelectasis or infiltrate.  Critical findings discussed Dr. Jeraldine Loots.   Original Report Authenticated By: Natasha Mead, M.D.     US Abdomen Complete  01/17/2012  *RADIOLOGY REPORT*  Clinical Data:  Abdominal pain. Nausea and vomiting.  COMPLETE ABDOMINAL ULTRASOUND  Comparison:  CT performed earlier.  Findings:  Gallbladder:  There is tenderness over the gallbladder to sonographic interrogation.  The gallbladder wall is thickened and contains multiple stones.  Gallbladder wall thickness is increased at 4.5 mm.  There is slight pericholecystic fluid.  Common bile duct:  Normal measuring 2.6 mm.  Liver:  Diffuse echogenicity.  No focal lesions.  IVC:  Unable to visualize due to bowel gas.  Pancreas:  Unable to visualize due to bowel gas.  Spleen:  Normal measuring 8.3 cm  Right Kidney:  No hydronephrosis or renal mass.  No calcifications. Normal length 11.6 cm  Left Kidney:  As no hydronephrosis or renal mass.  No calcifications.  Normal length of 13.0 cm.  Abdominal aorta:  Sonographic suspected measurement of 4.4 cm appears spurious.  There is extensive bowel gas which obscures visualization.  The aorta was nonaneurysmal on CT scan and heavily calcified.  IMPRESSION: Findings consistent with cholelithiasis and acute cholecystitis. Good correlation with CT appearance.  Hepatic steatosis.   Original Report Authenticated By: Davonna Belling, M.D.    Dg Abd Acute W/chest  01/17/2012  *RADIOLOGY REPORT*  Clinical Data: Lower abdominal pain radiates in the epigastric area.  No chest pain.  History of COPD  ACUTE ABDOMEN SERIES (ABDOMEN 2 VIEW & CHEST 1 VIEW)  Comparison: .  12/25/2011.  Findings: Right pleural effusion persists. Pleurx catheter has been removed.  Mild right basilar atelectasis.  Cardiomegaly with tortuous aorta.  No infiltrate, failure, or pneumothorax.  Similar appearance to priors.  Scattered air-fluid levels without dilated loops of small or large bowel.  No obstruction or free air.  Vascular calcification.  IMPRESSION: Possible mild ileus.  Chronic right pleural effusion.  No obstruction or free air.  No active infiltrates or  failure.   Original Report Authenticated By: Davonna Belling, M.D.      Disposition and Follow-up:     Discharge Orders    Future Appointments: Provider: Department: Dept Phone: Center:   02/06/2012 1:15 PM Bradd Canary, MD  HealthCare at  [facility] [phone] [email or web address]   02/07/2012 9:15 AM Sharlotte Alamo [facility] [place] [phone] None  02/07/2012 9:30 AM Josph Macho, MD [facility] [phone] None     Future Orders Please Complete By Expires   Diet - low sodium heart healthy      Increase activity slowly          DISPOSITION: Home with home health PT, RN followup DIET: Heart healthy diet ACTIVITY: As tolerated TESTS THAT NEED FOLLOW-UP Will need ERCP in 6-8 weeks  DISCHARGE FOLLOW-UP Follow-up Information    Follow up with [facility] E, MD. Schedule an appointment as soon as possible for a visit in 2 weeks.   Contact information:   [address] [place] [address] [phone]       Follow up with Rob Bunting, MD. In 6 weeks.   Contact information:   [address] [place] [address] [phone]       Follow up with Letitia Libra, Ala Dach, MD. Schedule an appointment as soon as possible for a visit in 2 weeks.   Contact information:   [address] [address] [phone]          Time spent on Discharge: 45 mins  Signed:   RAI,RIPUDEEP M.D. [facility] 01/24/2012, 10:26 AM Pager: [phone]

## 2012-01-24 NOTE — Progress Notes (Signed)
KUB from yesterday demonstrated pancreatic stent in place. At today's endoscopy and a biliary stent remains.  Okay for discharge. Followup ERCP 6-8 weeks with Dr. Wendall Papa

## 2012-01-24 NOTE — Progress Notes (Addendum)
Pt ok to DC home tonight per GI and attending MD. IV and tele removed. Pt remains stable. Pt taken via wheelchair down to short stay. [name], [name]

## 2012-01-25 ENCOUNTER — Encounter (HOSPITAL_COMMUNITY): Payer: Self-pay | Admitting: Gastroenterology

## 2012-01-25 ENCOUNTER — Telehealth (INDEPENDENT_AMBULATORY_CARE_PROVIDER_SITE_OTHER): Payer: Self-pay

## 2012-01-25 NOTE — Telephone Encounter (Signed)
Message copied by Ivory Broad on Thu Jan 25, 2012  1:34 PM ------      Message from: Marnette Burgess      Created: Thu Jan 25, 2012  1:11 PM      Contact: [phone]       Patient needs to be seen in 2wk po for GB repair, discarged on 01/24/12, soonest available is 02/28/12. (pt of Dr. Signe Colt)

## 2012-01-25 NOTE — Telephone Encounter (Signed)
I called and spoke to the wife and gave a postop appointment for 02/05/2012 at 3:40.

## 2012-01-31 ENCOUNTER — Telehealth (INDEPENDENT_AMBULATORY_CARE_PROVIDER_SITE_OTHER): Payer: Self-pay

## 2012-01-31 NOTE — Telephone Encounter (Signed)
The pt's wife calling in asking if pt can be seen sooner than Monday 1/27 b/c his drain has not put out any drainage in two days. I advised pt's wife that Dr [name] is not in the office everyday of the week for clinic and he just had clinic today but we would still run this by him to see if he would advise something different before Monday's appt.

## 2012-02-01 ENCOUNTER — Ambulatory Visit (INDEPENDENT_AMBULATORY_CARE_PROVIDER_SITE_OTHER): Payer: Medicare Other | Admitting: General Surgery

## 2012-02-01 ENCOUNTER — Telehealth (INDEPENDENT_AMBULATORY_CARE_PROVIDER_SITE_OTHER): Payer: Self-pay

## 2012-02-01 ENCOUNTER — Encounter (INDEPENDENT_AMBULATORY_CARE_PROVIDER_SITE_OTHER): Payer: Self-pay | Admitting: General Surgery

## 2012-02-01 VITALS — BP 120/70 | HR 54 | Temp 97.3°F | Resp 24 | Wt 189.0 lb

## 2012-02-01 DIAGNOSIS — Z9889 Other specified postprocedural states: Secondary | ICD-10-CM

## 2012-02-01 DIAGNOSIS — Z9049 Acquired absence of other specified parts of digestive tract: Secondary | ICD-10-CM

## 2012-02-01 NOTE — Telephone Encounter (Signed)
Please have him come into urge today.  I am doing it. Thx

## 2012-02-01 NOTE — Progress Notes (Signed)
Subjective:     Patient ID: Steve [name], male   DOB: Nov 15, 1933, 79 y.o.   MRN: [id number]  HPI Patient is status post endoscopic cholecystectomy laparoscopic repair common hepatic duct. His drain has put out nearly nothing for the past 48 hours. He's been eating well and moving his bowels. Today his appetite has been somewhat down.  Review of Systems     Objective:   Physical Exam Abdomen is soft and nontender. 3 incisions are healing well. Drain had less than 1 cc of light green fluid. Drain was stripped and no further fluid came out. Next, the drain was flushed with 10 cc of sterile saline. He was sent back to the bulb to ensure a clog was not causing The decreased output. This was ruled out. Drain was removed without difficulty and a dressing was placed.    Assessment:     Improving status post competent cholecystectomy.    Plan:     Return as scheduled in 4 days for followup. Plan was discussed in detail with patient and his wife. Should her questions.

## 2012-02-01 NOTE — Telephone Encounter (Signed)
Called pt's wife to see if pt could come in today to see Dr [name] at 2:30. The pt will be here this afternoon.

## 2012-02-05 ENCOUNTER — Ambulatory Visit (INDEPENDENT_AMBULATORY_CARE_PROVIDER_SITE_OTHER): Payer: Medicare Other | Admitting: General Surgery

## 2012-02-05 ENCOUNTER — Encounter (INDEPENDENT_AMBULATORY_CARE_PROVIDER_SITE_OTHER): Payer: Self-pay | Admitting: General Surgery

## 2012-02-05 VITALS — BP 138/74 | HR 46 | Temp 97.4°F | Resp 18 | Ht 69.0 in | Wt 188.0 lb

## 2012-02-05 DIAGNOSIS — S36129A Unspecified injury of gallbladder, initial encounter: Secondary | ICD-10-CM

## 2012-02-05 DIAGNOSIS — Z9049 Acquired absence of other specified parts of digestive tract: Secondary | ICD-10-CM

## 2012-02-05 DIAGNOSIS — S3613XA Injury of bile duct, initial encounter: Secondary | ICD-10-CM

## 2012-02-05 DIAGNOSIS — Z9889 Other specified postprocedural states: Secondary | ICD-10-CM

## 2012-02-05 NOTE — Progress Notes (Addendum)
Subjective:     Patient ID: Steve [name], male   DOB: 11-04-1933, 79 y.o.   MRN: [id number]  HPI  Patient status post laparoscopic cholecystectomy and repair common hepatic duct. I removed his drain last week. He is eating and moving his bowels. No abdominal pain. Review of Systems     Objective:   Physical Exam Abdomen soft and nontender. Wounds are healed including the drain site which has closed.    Assessment:     Improving    Plan:     Patient has a common bile duct stent in place. He will followup with Dr. Arlyce Dice next week. I would see him back in 6 weeks or so. He is doing very well. Patient's heart rate is in the upper 40s to low 50s. Plans follow up with his primary care physician tomorrow.

## 2012-02-06 ENCOUNTER — Ambulatory Visit (INDEPENDENT_AMBULATORY_CARE_PROVIDER_SITE_OTHER): Payer: Medicare Other | Admitting: Family Medicine

## 2012-02-06 ENCOUNTER — Encounter: Payer: Self-pay | Admitting: Family Medicine

## 2012-02-06 ENCOUNTER — Other Ambulatory Visit: Payer: Self-pay

## 2012-02-06 VITALS — BP 138/60 | HR 94 | Temp 97.6°F | Ht 68.0 in | Wt 187.0 lb

## 2012-02-06 DIAGNOSIS — J449 Chronic obstructive pulmonary disease, unspecified: Secondary | ICD-10-CM

## 2012-02-06 DIAGNOSIS — F329 Major depressive disorder, single episode, unspecified: Secondary | ICD-10-CM

## 2012-02-06 DIAGNOSIS — F341 Dysthymic disorder: Secondary | ICD-10-CM

## 2012-02-06 DIAGNOSIS — R001 Bradycardia, unspecified: Secondary | ICD-10-CM

## 2012-02-06 DIAGNOSIS — S36129A Unspecified injury of gallbladder, initial encounter: Secondary | ICD-10-CM

## 2012-02-06 DIAGNOSIS — I10 Essential (primary) hypertension: Secondary | ICD-10-CM

## 2012-02-06 DIAGNOSIS — S3613XA Injury of bile duct, initial encounter: Secondary | ICD-10-CM

## 2012-02-06 DIAGNOSIS — F32A Depression, unspecified: Secondary | ICD-10-CM

## 2012-02-06 DIAGNOSIS — I498 Other specified cardiac arrhythmias: Secondary | ICD-10-CM

## 2012-02-06 DIAGNOSIS — I4891 Unspecified atrial fibrillation: Secondary | ICD-10-CM

## 2012-02-06 DIAGNOSIS — F418 Other specified anxiety disorders: Secondary | ICD-10-CM

## 2012-02-06 MED ORDER — METOPROLOL TARTRATE 25 MG PO TABS: 12.5000 mg | ORAL_TABLET | Freq: Two times a day (BID) | ORAL | Status: DC

## 2012-02-06 MED ORDER — SERTRALINE HCL 100 MG PO TABS: 100.0000 mg | ORAL_TABLET | Freq: Every day | ORAL | Status: DC

## 2012-02-06 NOTE — Assessment & Plan Note (Signed)
Has quit cigarettes for a month now

## 2012-02-06 NOTE — Assessment & Plan Note (Signed)
Well controlled, no changes 

## 2012-02-06 NOTE — Assessment & Plan Note (Addendum)
Is doing much better, no fevers, appetite has returned.

## 2012-02-06 NOTE — Progress Notes (Signed)
Patient ID: Steve [name], male   DOB: 08/13/1933, 79 y.o.   MRN: [id number] Steve [name] [id number] 01-05-1934 02/06/2012      Progress Note-Follow Up  Subjective  Chief Complaint  Chief Complaint  Patient presents with  . Follow-up    3 month    HPI  Patient is a 79 year old caucasian male in today for follow up on multiple medical problems. He is accompanied by his wife and they both agree that his depression has worsened since his recent health set back involving his gallbladder. At this time he feels he is healing well after complications from his cholecystectomy. No f/c/diarrhea/n/v/anorexia. His abdominal pain has resolved. Is noting fatigue, anhedonia, difficulty concentrating and no motivation. No cp, palp, sob, gu c/o  Past Medical History  Diagnosis Date  . Prostate enlargement   . History of chicken pox     childhood  . GERD (gastroesophageal reflux disease)   . Arthritis     knee and back  . AAA (abdominal aortic aneurysm)     stress test 09/14/10 EPIC  . Anxiety   . Dysrhythmia     hx of atrial fib post op x 2 days   . COPD (chronic obstructive pulmonary disease)   . Shortness of breath     with exertion   . Recurrent upper respiratory infection (URI)     productive cough- white phlegm- no fever  . Sleep apnea      12/12 sleep study,SEVERE per study  Dr Delton Coombes- states doesnt wear machine on regular basis- setting BiPAP 9/9  . Hypertension     chest x ray 12/12 EPIC- repeated 06/06/11, EKG 11/12 EPIC  . DVT (deep venous thrombosis) 08/2011  . Pneumonia   . Bruises easily     takes Xarelto    Past Surgical History  Procedure Date  . Knee surgery 1957    left knee arthotomy  . Abdominal aortic aneurysm repair 11/14/2010    AAA Repair    Dr Arbie Cookey  . Transurethral resection of prostate 01/25/2011    TURP  . Incisional hernia repair 06/12/2011    Procedure: HERNIA REPAIR INCISIONAL;  Surgeon: Velora Heckler, MD;  Location: [facility] ORS;  Service: General;  Laterality:  N/A;  Open Repair Ventral Incisional Hernia with Mesh  . Hernia repair   . Video assisted thoracoscopy 11/02/2011    Procedure: VIDEO ASSISTED THORACOSCOPY;  Surgeon: Loreli Slot, MD;  Location: [facility] OR;  Service: Thoracic;  Laterality: Right;  . Pleural biopsy 11/02/2011    Procedure: PLEURAL BIOPSY;  Surgeon: Loreli Slot, MD;  Location: [facility] OR;  Service: Thoracic;  Laterality: Right;  Parietal and visceral biopsies  . Chest tube insertion 11/02/2011    Procedure: INSERTION PLEURAL DRAINAGE CATHETER;  Surgeon: Loreli Slot, MD;  Location: [facility] OR;  Service: Thoracic;  Laterality: Right;  . Decortication 11/02/2011    Procedure: DECORTICATION;  Surgeon: Loreli Slot, MD;  Location: [facility] OR;  Service: Thoracic;  Laterality: Right;  . Pleural effusion drainage 11/02/2011    Procedure: DRAINAGE OF PLEURAL EFFUSION;  Surgeon: Loreli Slot, MD;  Location: [facility] OR;  Service: Thoracic;  Laterality: Right;  . Cholecystectomy 01/19/2012    Procedure: LAPAROSCOPIC CHOLECYSTECTOMY WITH INTRAOPERATIVE CHOLANGIOGRAM;  Surgeon: Liz Malady, MD;  Location: [facility] OR;  Service: General;  Laterality: N/A;  laparoscopic cholecystectomy with intraoperative cholangiogram  . Ercp 01/22/2012    Procedure: ENDOSCOPIC RETROGRADE CHOLANGIOPANCREATOGRAPHY (ERCP);  Surgeon: Louis Meckel, MD;  Location: [facility] OR;  Service: Endoscopy;  Laterality: N/A;  with stent placement  . Ercp 01/21/2012    Procedure: ENDOSCOPIC RETROGRADE CHOLANGIOPANCREATOGRAPHY (ERCP);  Surgeon: Rachael Fee, MD;  Location: [facility] OR;  Service: Gastroenterology;  Laterality: N/A;  . Esophagogastroduodenoscopy 01/24/2012    Procedure: ESOPHAGOGASTRODUODENOSCOPY (EGD);  Surgeon: Louis Meckel, MD;  Location: [facility] ENDOSCOPY;  Service: Endoscopy;  Laterality: N/A;  remove pancr stent     Family History  Problem Relation Age of Onset  . Arthritis Mother   . Heart failure Mother   . Hypertension Mother   . Diabetes  Mother   . Heart failure Daughter     deceased age 52  . Other Daughter     deceased age 67 MVA    History   Social History  . Marital Status: Married    Spouse Name: N/A    Number of Children: N/A  . Years of Education: N/A   Occupational History  . Not on file.   Social History Main Topics  . Smoking status: Former Smoker -- 0.5 packs/day for 60 years    Types: Cigarettes    Quit date: 01/10/2012  . Smokeless tobacco: Never Used     Comment: pt using electric cig  . Alcohol Use: No  . Drug Use: No  . Sexually Active: Not on file   Other Topics Concern  . Not on file   Social History Narrative  . No narrative on file    Current Outpatient Prescriptions on File Prior to Visit  Medication Sig Dispense Refill  . albuterol (PROVENTIL HFA;VENTOLIN HFA) 108 (90 BASE) MCG/ACT inhaler Inhale 2 puffs into the lungs every 6 (six) hours as needed for wheezing.  1 Inhaler  2  . ALPRAZolam (XANAX) 0.25 MG tablet Take 0.25 mg by mouth at bedtime.      . Rivaroxaban (XARELTO) 20 MG TABS Take 1 tablet (20 mg total) by mouth every morning.  30 tablet  3  . senna (SENOKOT) 8.6 MG TABS Take 2 tablets by mouth at bedtime.      Marland Kitchen SPIRIVA HANDIHALER 18 MCG inhalation capsule INHALE ONE DOSE BY MOUTH EVERY DAY  30 capsule  0    Allergies  Allergen Reactions  . Lyrica (Pregabalin) Hives and Itching    Review of Systems  Review of Systems  Constitutional: Positive for malaise/fatigue. Negative for fever.  HENT: Negative for congestion.   Eyes: Negative for discharge.  Respiratory: Positive for shortness of breath.   Cardiovascular: Negative for chest pain, palpitations and leg swelling.  Gastrointestinal: Negative for nausea, abdominal pain and diarrhea.  Genitourinary: Negative for dysuria.  Musculoskeletal: Negative for falls.  Skin: Negative for rash.  Neurological: Negative for loss of consciousness and headaches.  Endo/Heme/Allergies: Negative for polydipsia.    Psychiatric/Behavioral: Positive for depression. Negative for suicidal ideas. The patient has insomnia. The patient is not nervous/anxious.     Objective  BP 138/60  Pulse 94  Temp 97.6 F (36.4 C) (Oral)  Ht 5\' 8"  (1.727 m)  Wt 187 lb (84.823 kg)  BMI 28.43 kg/m2  SpO2 94%  Physical Exam  Physical Exam  Constitutional: He is oriented to person, place, and time and well-developed, well-nourished, and in no distress. No distress.  HENT:  Head: Normocephalic and atraumatic.  Eyes: Conjunctivae normal are normal.  Neck: Neck supple. No thyromegaly present.  Cardiovascular: Normal rate, regular rhythm and normal heart sounds.   Pulmonary/Chest: Effort normal and breath sounds normal. No respiratory distress.  Prolonged expiratory phase, poor air movement.  Abdominal: He exhibits no distension and no mass. There is no tenderness.  Musculoskeletal: He exhibits no edema.  Neurological: He is alert and oriented to person, place, and time.  Skin: Skin is warm.  Psychiatric: Memory, affect and judgment normal.    Lab Results  Component Value Date   TSH 1.05 08/01/2011   Lab Results  Component Value Date   WBC 9.8 01/23/2012   HGB 10.7* 01/23/2012   HCT 32.6* 01/23/2012   MCV 93.7 01/23/2012   PLT 186 01/23/2012   Lab Results  Component Value Date   CREATININE 0.74 01/23/2012   BUN 9 01/23/2012   NA 139 01/23/2012   K 3.3* 01/23/2012   CL 102 01/23/2012   CO2 27 01/23/2012   Lab Results  Component Value Date   ALT 27 01/20/2012   AST 26 01/20/2012   ALKPHOS 111 01/20/2012   BILITOT 0.6 01/20/2012   Lab Results  Component Value Date   CHOL 166 09/07/2010   Lab Results  Component Value Date   HDL 29* 09/07/2010   Lab Results  Component Value Date   LDLCALC 116* 09/07/2010   Lab Results  Component Value Date   TRIG 104 09/07/2010   Lab Results  Component Value Date   CHOLHDL 5.7 09/07/2010     Assessment & Plan  Bradycardia Will drop Metoprolol to 12.5 mg po  bid and recheck at next visit  Bile duct injury Is doing much better, no fevers, appetite has returned.  Atrial fibrillation Rate controled, tolerating Xarelto  Hypertension Well controlled, no changes  Depression He and his wife note this has worsened with recent surgical set back will increase Sertraline to 100 mg daily  COPD (chronic obstructive pulmonary disease) Has quit cigarettes for a month now

## 2012-02-06 NOTE — Patient Instructions (Addendum)

## 2012-02-06 NOTE — Assessment & Plan Note (Signed)
He and his wife note this has worsened with recent surgical set back will increase Sertraline to 100 mg daily

## 2012-02-06 NOTE — Assessment & Plan Note (Signed)
Rate controled, tolerating Xarelto

## 2012-02-06 NOTE — Assessment & Plan Note (Signed)
Will drop Metoprolol to 12.5 mg po bid and recheck at next visit

## 2012-02-07 ENCOUNTER — Other Ambulatory Visit: Payer: Medicare Other | Admitting: Lab

## 2012-02-07 ENCOUNTER — Ambulatory Visit (HOSPITAL_BASED_OUTPATIENT_CLINIC_OR_DEPARTMENT_OTHER): Payer: Medicare Other | Admitting: Hematology & Oncology

## 2012-02-07 ENCOUNTER — Other Ambulatory Visit: Payer: Self-pay | Admitting: Hematology & Oncology

## 2012-02-07 VITALS — BP 154/57 | HR 53 | Temp 97.3°F | Resp 20 | Ht 68.0 in | Wt 188.0 lb

## 2012-02-07 DIAGNOSIS — D6859 Other primary thrombophilia: Secondary | ICD-10-CM

## 2012-02-07 DIAGNOSIS — I82409 Acute embolism and thrombosis of unspecified deep veins of unspecified lower extremity: Secondary | ICD-10-CM

## 2012-02-07 DIAGNOSIS — R894 Abnormal immunological findings in specimens from other organs, systems and tissues: Secondary | ICD-10-CM

## 2012-02-07 DIAGNOSIS — N62 Hypertrophy of breast: Secondary | ICD-10-CM

## 2012-02-07 DIAGNOSIS — C450 Mesothelioma of pleura: Secondary | ICD-10-CM

## 2012-02-07 DIAGNOSIS — D472 Monoclonal gammopathy: Secondary | ICD-10-CM

## 2012-02-07 DIAGNOSIS — Z7901 Long term (current) use of anticoagulants: Secondary | ICD-10-CM

## 2012-02-07 DIAGNOSIS — C3490 Malignant neoplasm of unspecified part of unspecified bronchus or lung: Secondary | ICD-10-CM

## 2012-02-07 LAB — CBC WITH DIFFERENTIAL (CANCER CENTER ONLY)
BASO#: 0 10*3/uL (ref 0.0–0.2)
BASO%: 0.1 % (ref 0.0–2.0)
EOS%: 2.6 % (ref 0.0–7.0)
HCT: 40.7 % (ref 38.7–49.9)
HGB: 13.1 g/dL (ref 13.0–17.1)
LYMPH#: 1.3 10*3/uL (ref 0.9–3.3)
LYMPH%: 13.3 % — ABNORMAL LOW (ref 14.0–48.0)
MCH: 30.5 pg (ref 28.0–33.4)
MCHC: 32.2 g/dL (ref 32.0–35.9)
MONO%: 4.7 % (ref 0.0–13.0)
NEUT%: 79.3 % (ref 40.0–80.0)
RDW: 14.5 % (ref 11.1–15.7)

## 2012-02-07 NOTE — Progress Notes (Signed)
CC:   Gabrielle Dare. [name], M.D. Marguarite Arbour, MD Salvatore Decent. Dorris Fetch, M.D.  DIAGNOSES: 1. Malignant melanoma of the right lung, localized disease. 2. Deep venous thrombosis of the right leg, factor V Leiden mutation     along with lupus anticoagulant. 3. 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  CURRENT THERAPY:  Xarelto 20 mg p.o. daily.  INTERIM HISTORY:  Mr. Tuohy comes in for followup.  Unfortunately, since we last saw him, he was admitted over to, I think, [facility] He had acute cholecystitis.  He underwent a cholecystectomy.  Apparently there were some issues with the surgery.  He needed to go back in for additional surgery.  He has been in the hospital for a week.  While in the hospital, he was placed on  heparin.  He is feeling okay now.  He made it through despite the snow.  He had a good breakfast this morning.  His problem now has been bleeding from the left nipple.  He says his chest is sore.  This specifically was the breast tissue bilaterally.  He may have gynecomastia.  We will need to get a mammogram to see what is going on.  He has had no bleeding otherwise.  He says the bleeding from the left nipple is only intermittent.  There has been no cough.  He has had no increased shortness of breath. He has had no leg pain.  He has had no nausea or vomiting.  This pretty much improved after he got his gallbladder taken out.  Overall, he is holding relatively stable.  His last protein electrophoresis which we did back in November showed a monoclonal spike 1.21 g/dL.  IgA level was 1190 mg/dL.  PHYSICAL EXAMINATION:  This is an elderly but fairly well-nourished white gentleman in no obvious distress.  Vital signs:  Temperature of 97.3, pulse 53, respiratory rate 20, blood pressure 154/57.  Weight is 188.  Head/neck:  Normocephalic, atraumatic skull.  There are no ocular or oral lesions.   There are no palpable cervical or supraclavicular lymph nodes.  Lungs:  Clear bilaterally.  Cardiac:  Regular rate and rhythm with a normal S1 and S2.  There are no murmurs, rubs or bruits. Abdomen:  Soft with good bowel sounds.  He has laparoscopy scars that are well healed.  There is no fluid wave.  He has a laparotomy scar that is well-healed.  There is no palpable hepatosplenomegaly.  Back:  No tenderness over the spine, ribs, or hips.  Extremities:  No clubbing, cyanosis or edema.  LABORATORY STUDIES:  White cell count is 10, hemoglobin 13.1, hematocrit 40.7, platelet count 205.  IMPRESSION:  Mr. Okimoto is a 79 year old gentleman with multiple issues. He does have mesothelioma.  Again, this was diagnosed via a VATS procedure.  He is totally asymptomatic with this.  Even a PET scan did not show any activity.  However, we do need to follow up with another PET scan so we can make sure that he does not have evidence of progressive disease.  As far as his hypercoagulability goes, he is on Xarelto.  This is long- term.  He is heterozygous for factor V Leiden.  He does have the lupus anticoagulant which is truly positive.  We will follow up with his MGUS.  I suspect the MGUS is reactive.  We will be very interested to see what the mammogram shows.  If he has gynecomastia and the bleeding  continues, we may need to consider some radiation therapy.  We will have Mr. Hisaw get scans in the next couple of weeks.  I will then plan to get him back in 1 month.    ______________________________ Josph Macho, M.D. PRE/MEDQ  D:  02/07/2012  T:  02/07/2012  Job:  [id number]

## 2012-02-07 NOTE — Progress Notes (Signed)
This office note has been dictated.

## 2012-02-08 ENCOUNTER — Encounter (HOSPITAL_COMMUNITY): Payer: Self-pay | Admitting: *Deleted

## 2012-02-09 LAB — PROTEIN ELECTROPHORESIS, SERUM, WITH REFLEX
Albumin ELP: 46.8 % — ABNORMAL LOW (ref 55.8–66.1)
Beta Globulin: 22 % — ABNORMAL HIGH (ref 4.7–7.2)
M-Spike, %: 1.35 g/dL
Total Protein, Serum Electrophoresis: 7.3 g/dL (ref 6.0–8.3)

## 2012-02-09 LAB — COMPREHENSIVE METABOLIC PANEL
ALT: 17 U/L (ref 0–53)
AST: 14 U/L (ref 0–37)
Alkaline Phosphatase: 99 U/L (ref 39–117)
BUN: 21 mg/dL (ref 6–23)
Calcium: 9.6 mg/dL (ref 8.4–10.5)
Chloride: 104 mEq/L (ref 96–112)
Creatinine, Ser: 1.07 mg/dL (ref 0.50–1.35)
Total Bilirubin: 0.4 mg/dL (ref 0.3–1.2)

## 2012-02-09 LAB — IGG, IGA, IGM: IgM, Serum: 87 mg/dL (ref 41–251)

## 2012-02-09 LAB — KAPPA/LAMBDA LIGHT CHAINS
Kappa free light chain: 1.61 mg/dL (ref 0.33–1.94)
Lambda Free Lght Chn: 9.06 mg/dL — ABNORMAL HIGH (ref 0.57–2.63)

## 2012-02-13 ENCOUNTER — Encounter: Payer: Self-pay | Admitting: Gastroenterology

## 2012-02-13 ENCOUNTER — Ambulatory Visit (INDEPENDENT_AMBULATORY_CARE_PROVIDER_SITE_OTHER): Payer: Medicare Other | Admitting: Gastroenterology

## 2012-02-13 VITALS — BP 134/60 | HR 60 | Ht 66.75 in | Wt 189.1 lb

## 2012-02-13 DIAGNOSIS — K831 Obstruction of bile duct: Secondary | ICD-10-CM

## 2012-02-13 NOTE — Progress Notes (Signed)
Review of pertinent gastrointestinal problems: 1. Gallstone disease:  Acute cholecystitis S/p 01/19/11 lap chole lysis of dense adhesions (Dr. Burke Thompson) required to dissect out GB and repair of CHD. During the dissection he noted "a small amount of bile staining below the cystic duct. Close inspection in that area revealed a small pinhole in the common hepatic duct." This was repaired. Follow up ERCP attempt by me to place CBD, CHD stent was unsuccessful however Dr. Kaplan was able to accomplish it the next day (placed 7cm long 10Fr stent in bile duct and PD stent that spontaneously migrated).  HPI: This is a  very pleasant 78-year-old man whom I last saw when he was hospitalized at [place] with acute cholecystitis. See a summary of that encounter above.  Liver tests checked last week were all normal.  Since leaving the hospital, has pain with eating in his esophagus.  Mild pain in chest with eating.      Past Medical History  Diagnosis Date  . Prostate enlargement   . GERD (gastroesophageal reflux disease)   . Arthritis     knee and back  . AAA (abdominal aortic aneurysm)     stress test 09/14/10 EPIC  . Anxiety   . Dysrhythmia     hx of atrial fib post op x 2 days   . COPD (chronic obstructive pulmonary disease)   . Shortness of breath     with exertion   . Recurrent upper respiratory infection (URI)     productive cough- white phlegm- no fever  . Sleep apnea      12/12 sleep study,SEVERE per study  Dr Byrum- states doesnt wear machine on regular basis- setting BiPAP 9/9  . Hypertension     chest x ray 12/12 EPIC- repeated 06/06/11, EKG 11/12 EPIC  . DVT (deep venous thrombosis) 08/2011  . Pneumonia   . Bruises easily     takes Xarelto  . Atrial fibrillation   . Mesothelioma   . Depression   . HTN (hypertension)     Past Surgical History  Procedure Date  . Knee surgery 1957    left knee arthotomy  . Abdominal aortic aneurysm repair 11/14/2010    AAA Repair    Dr  Early  . Transurethral resection of prostate 01/25/2011    TURP  . Incisional hernia repair 06/12/2011    Procedure: HERNIA REPAIR INCISIONAL;  Surgeon: Todd M Gerkin, MD;  Location: [facility]S;  Service: General;  Laterality: N/A;  Open Repair Ventral Incisional Hernia with Mesh  . Hernia repair   . Video assisted thoracoscopy 11/02/2011    Procedure: VIDEO ASSISTED THORACOSCOPY;  Surgeon: [name] Hendrickson, MD;  Location: [facility] OR;  Service: Thoracic;  Laterality: Right;  . Pleural biopsy 11/02/2011    Procedure: PLEURAL BIOPSY;  Surgeon: [name] Hendrickson, MD;  Location: [facility] OR;  Service: Thoracic;  Laterality: Right;  Parietal and visceral biopsies  . Chest tube insertion 11/02/2011    Procedure: INSERTION PLEURAL DRAINAGE CATHETER;  Surgeon: [name] Hendrickson, MD;  Location: [facility] OR;  Service: Thoracic;  Laterality: Right;  . Decortication 11/02/2011    Procedure: DECORTICATION;  Surgeon: [name] Hendrickson, MD;  Location: [facility] OR;  Service: Thoracic;  Laterality: Right;  . Pleural effusion drainage 11/02/2011    Procedure: DRAINAGE OF PLEURAL EFFUSION;  Surgeon: [name] Hendrickson, MD;  Location: [facility] OR;  Service: Thoracic;  Laterality: Right;  . Cholecystectomy 01/19/2012    Procedure: LAPAROSCOPIC CHOLECYSTECTOMY WITH INTRAOPERATIVE CHOLANGIOGRAM;  Surgeon:   Burke E Thompson, MD;  Location: [facility] OR;  Service: General;  Laterality: N/A;  laparoscopic cholecystectomy with intraoperative cholangiogram  . Ercp 01/22/2012    Procedure: ENDOSCOPIC RETROGRADE CHOLANGIOPANCREATOGRAPHY (ERCP);  Surgeon: Robert D Kaplan, MD;  Location: [facility] OR;  Service: Endoscopy;  Laterality: N/A;  with stent placement  . Ercp 01/21/2012    Procedure: ENDOSCOPIC RETROGRADE CHOLANGIOPANCREATOGRAPHY (ERCP);  Surgeon: Glendine Swetz P Jiovani Mccammon, MD;  Location: [facility] OR;  Service: Gastroenterology;  Laterality: N/A;  . Esophagogastroduodenoscopy 01/24/2012    Procedure: ESOPHAGOGASTRODUODENOSCOPY (EGD);  Surgeon: Robert D Kaplan, MD;   Location: [facility];  Service: Endoscopy;  Laterality: N/A;  remove pancr stent     Current Outpatient Prescriptions  Medication Sig Dispense Refill  . albuterol (PROVENTIL HFA;VENTOLIN HFA) 108 (90 BASE) MCG/ACT inhaler Inhale 2 puffs into the lungs every 6 (six) hours as needed for wheezing.  1 Inhaler  2  . ALPRAZolam (XANAX) 0.25 MG tablet Take 0.25 mg by mouth at bedtime.      . metoprolol tartrate (LOPRESSOR) 12.5 mg TABS Take 12.5 mg by mouth 2 (two) times daily.      . Rivaroxaban (XARELTO) 20 MG TABS Take 1 tablet (20 mg total) by mouth every morning.  30 tablet  3  . senna (SENOKOT) 8.6 MG TABS Take 2 tablets by mouth at bedtime.      . sertraline (ZOLOFT) 100 MG tablet Take 1 tablet (100 mg total) by mouth daily.  30 tablet  3  . SPIRIVA HANDIHALER 18 MCG inhalation capsule INHALE ONE DOSE BY MOUTH EVERY DAY  30 capsule  0    Allergies as of 02/13/2012 - Review Complete 02/13/2012  Allergen Reaction Noted  . Lyrica (pregabalin) Hives and Itching 09/14/2010    Family History  Problem Relation Age of Onset  . Arthritis Mother   . Heart failure Mother   . Hypertension Mother   . Diabetes Mother   . Heart failure Daughter     deceased age 50  . Other Daughter     deceased age 22 MVA    History   Social History  . Marital Status: Married    Spouse Name: N/A    Number of Children: 6  . Years of Education: N/A   Occupational History  . retired    Social History Main Topics  . Smoking status: Former Smoker -- 0.5 packs/day for 60 years    Types: Cigarettes    Quit date: 01/10/2012  . Smokeless tobacco: Never Used     Comment: pt using electric cig  . Alcohol Use: No  . Drug Use: No  . Sexually Active: Not on file   Other Topics Concern  . Not on file   Social History Narrative  . No narrative on file      Physical Exam: BP 134/60  Pulse 60  Ht 5' 6.75" (1.695 m)  Wt 189 lb 2 oz (85.787 kg)  BMI 29.84 kg/m2 Constitutional: generally  well-appearing Psychiatric: alert and oriented x3 Abdomen: soft, nontender, nondistended, no obvious ascites, no peritoneal signs, normal bowel sounds     Assessment and plan: 78 y.o. male with biliary stent in place do to bile duct injury during cholecystectomy last month  We will plan on ERCP later this month which will give him about 6 weeks of indwelling biliary stent. That should be placed time to allow the site he'll and hopefully prevent postoperative stricturing. He is on xarelto for previous DVTs, this is managed by Dr. Pete Ennever    and we will get in touch with him about the safety of holding this blood thinner for 3 days prior to the ERCP. He may asked that the patient be on a Lovenox bridge.   

## 2012-02-13 NOTE — Patient Instructions (Addendum)
ERCP Feb 27th at [facility] with MAC sedation to remove your bile duct stent. Will need to stop your zarelto,we will communicate with Dr. Myna Hidalgo about holding this for 3 days prior to the ERCP, need for lovenox shots as a bridge.

## 2012-02-14 ENCOUNTER — Ambulatory Visit
Admission: RE | Admit: 2012-02-14 | Discharge: 2012-02-14 | Disposition: A | Discharge status: Home / Self Care | Payer: Medicare Other | Source: Ambulatory Visit | Attending: Hematology & Oncology | Admitting: Hematology & Oncology

## 2012-02-14 ENCOUNTER — Ambulatory Visit (HOSPITAL_COMMUNITY): Admission: RE | Admit: 2012-02-14 | Payer: Medicare Other | Source: Ambulatory Visit

## 2012-02-14 ENCOUNTER — Telehealth: Payer: Self-pay

## 2012-02-14 ENCOUNTER — Ambulatory Visit (HOSPITAL_COMMUNITY)
Admission: RE | Admit: 2012-02-14 | Discharge: 2012-02-14 | Disposition: A | Discharge status: Home / Self Care | Payer: Medicare Other | Source: Ambulatory Visit | Attending: Hematology & Oncology | Admitting: Hematology & Oncology

## 2012-02-14 ENCOUNTER — Other Ambulatory Visit: Payer: Self-pay | Admitting: Hematology & Oncology

## 2012-02-14 ENCOUNTER — Telehealth: Payer: Self-pay | Admitting: Hematology & Oncology

## 2012-02-14 DIAGNOSIS — N62 Hypertrophy of breast: Secondary | ICD-10-CM

## 2012-02-14 DIAGNOSIS — C450 Mesothelioma of pleura: Secondary | ICD-10-CM

## 2012-02-14 NOTE — Telephone Encounter (Signed)
Steve [name], See the above. He should not take his xarelto the day before or the day of there ERCP later this month. No need for lovenox. Thanks ----- Message ----- From: Steve Macho, MD Sent: 02/14/2012 6:41 AM To: Rachael Fee, MD Dan: Mr. Cassedy will only need to have the Xarelto held the day before the procedure. It has a short half-life and 24hr off will be sufficient. He does not need Lovenox. Good luck!!! Cindee Lame

## 2012-02-14 NOTE — Telephone Encounter (Signed)
Pt is aware that he should hold the xarelto the day before and the day of the procedure

## 2012-02-14 NOTE — Telephone Encounter (Signed)
Lupita Leash from [facility] just called saying they couldn't do mammograms on males. Talked with Felia at [facility] and Pt is aware of mammogram at the breast center at 330 pm today.

## 2012-02-19 ENCOUNTER — Ambulatory Visit (HOSPITAL_COMMUNITY)
Admission: RE | Admit: 2012-02-19 | Discharge: 2012-02-19 | Disposition: A | Discharge status: Home / Self Care | Payer: Medicare Other | Source: Ambulatory Visit | Attending: Hematology & Oncology | Admitting: Hematology & Oncology

## 2012-02-19 DIAGNOSIS — E041 Nontoxic single thyroid nodule: Secondary | ICD-10-CM | POA: Insufficient documentation

## 2012-02-19 DIAGNOSIS — I700 Atherosclerosis of aorta: Secondary | ICD-10-CM | POA: Insufficient documentation

## 2012-02-19 DIAGNOSIS — I517 Cardiomegaly: Secondary | ICD-10-CM | POA: Insufficient documentation

## 2012-02-19 DIAGNOSIS — Z9089 Acquired absence of other organs: Secondary | ICD-10-CM | POA: Insufficient documentation

## 2012-02-19 DIAGNOSIS — I723 Aneurysm of iliac artery: Secondary | ICD-10-CM | POA: Insufficient documentation

## 2012-02-19 DIAGNOSIS — C801 Malignant (primary) neoplasm, unspecified: Secondary | ICD-10-CM | POA: Insufficient documentation

## 2012-02-19 DIAGNOSIS — J438 Other emphysema: Secondary | ICD-10-CM | POA: Insufficient documentation

## 2012-02-19 DIAGNOSIS — I2510 Atherosclerotic heart disease of native coronary artery without angina pectoris: Secondary | ICD-10-CM | POA: Insufficient documentation

## 2012-02-19 DIAGNOSIS — N289 Disorder of kidney and ureter, unspecified: Secondary | ICD-10-CM | POA: Insufficient documentation

## 2012-02-19 LAB — GLUCOSE, CAPILLARY: Glucose-Capillary: 86 mg/dL (ref 70–99)

## 2012-02-19 MED ORDER — FLUDEOXYGLUCOSE F - 18 (FDG) INJECTION
19.9000 | Freq: Once | INTRAVENOUS | Status: AC | PRN
  Administered 2012-02-19: 19.9 via INTRAVENOUS

## 2012-02-21 ENCOUNTER — Encounter (HOSPITAL_COMMUNITY): Payer: Self-pay | Admitting: *Deleted

## 2012-02-22 ENCOUNTER — Encounter (HOSPITAL_COMMUNITY): Payer: Self-pay | Admitting: *Deleted

## 2012-02-26 ENCOUNTER — Other Ambulatory Visit: Payer: Self-pay | Admitting: Hematology & Oncology

## 2012-02-26 DIAGNOSIS — E041 Nontoxic single thyroid nodule: Secondary | ICD-10-CM

## 2012-02-27 ENCOUNTER — Telehealth: Payer: Self-pay | Admitting: *Deleted

## 2012-02-27 NOTE — Telephone Encounter (Signed)
Message copied by Anselm Jungling on Tue Feb 27, 2012 11:40 AM ------      Message from: Arlan Organ R      Created: Mon Feb 26, 2012  6:38 PM       I called his wife and told her that there was no obvious activity in the lungs/pleura, but that the LEFT lobe of thyroid gland may have cancer. Need to do bx.  He is on Xarelto, so this will need to be stopped day before the bx.  I will set up bx with radiology and they will call her for appt.]            Pete ------

## 2012-03-04 ENCOUNTER — Encounter (HOSPITAL_COMMUNITY): Payer: Self-pay | Admitting: Pharmacy Technician

## 2012-03-05 ENCOUNTER — Other Ambulatory Visit (HOSPITAL_COMMUNITY)
Admission: RE | Admit: 2012-03-05 | Discharge: 2012-03-05 | Disposition: A | Discharge status: Home / Self Care | Payer: Medicare Other | Source: Ambulatory Visit | Attending: Interventional Radiology | Admitting: Interventional Radiology

## 2012-03-05 ENCOUNTER — Ambulatory Visit
Admission: RE | Admit: 2012-03-05 | Discharge: 2012-03-05 | Disposition: A | Discharge status: Home / Self Care | Payer: Medicare Other | Source: Ambulatory Visit | Attending: Hematology & Oncology | Admitting: Hematology & Oncology

## 2012-03-05 DIAGNOSIS — E049 Nontoxic goiter, unspecified: Secondary | ICD-10-CM | POA: Insufficient documentation

## 2012-03-06 ENCOUNTER — Ambulatory Visit (HOSPITAL_BASED_OUTPATIENT_CLINIC_OR_DEPARTMENT_OTHER): Payer: Medicare Other | Admitting: Hematology & Oncology

## 2012-03-06 ENCOUNTER — Other Ambulatory Visit (HOSPITAL_BASED_OUTPATIENT_CLINIC_OR_DEPARTMENT_OTHER): Payer: Medicare Other | Admitting: Lab

## 2012-03-06 VITALS — BP 184/66 | HR 60 | Temp 97.2°F | Resp 18 | Ht 66.0 in | Wt 192.0 lb

## 2012-03-06 DIAGNOSIS — C73 Malignant neoplasm of thyroid gland: Secondary | ICD-10-CM

## 2012-03-06 LAB — CBC WITH DIFFERENTIAL (CANCER CENTER ONLY)
BASO%: 0.3 % (ref 0.0–2.0)
EOS%: 3.2 % (ref 0.0–7.0)
LYMPH#: 1.5 10*3/uL (ref 0.9–3.3)
MCH: 30.8 pg (ref 28.0–33.4)
MCHC: 32.7 g/dL (ref 32.0–35.9)
MONO%: 7.7 % (ref 0.0–13.0)
NEUT#: 4.6 10*3/uL (ref 1.5–6.5)
Platelets: 149 10*3/uL (ref 145–400)
RDW: 14.9 % (ref 11.1–15.7)

## 2012-03-07 ENCOUNTER — Encounter (HOSPITAL_COMMUNITY)
Admission: RE | Disposition: A | Discharge status: Home / Self Care | Payer: Self-pay | Source: Ambulatory Visit | Attending: Gastroenterology

## 2012-03-07 ENCOUNTER — Telehealth: Payer: Self-pay

## 2012-03-07 ENCOUNTER — Telehealth: Payer: Self-pay | Admitting: Hematology & Oncology

## 2012-03-07 ENCOUNTER — Ambulatory Visit (HOSPITAL_COMMUNITY): Payer: Medicare Other | Admitting: Anesthesiology

## 2012-03-07 ENCOUNTER — Telehealth: Payer: Self-pay | Admitting: Internal Medicine

## 2012-03-07 ENCOUNTER — Ambulatory Visit (HOSPITAL_COMMUNITY): Payer: Medicare Other

## 2012-03-07 ENCOUNTER — Ambulatory Visit (HOSPITAL_COMMUNITY)
Admission: RE | Admit: 2012-03-07 | Discharge: 2012-03-07 | Disposition: A | Discharge status: Home / Self Care | Payer: Medicare Other | Source: Ambulatory Visit | Attending: Gastroenterology | Admitting: Gastroenterology

## 2012-03-07 ENCOUNTER — Other Ambulatory Visit: Payer: Self-pay | Admitting: Emergency Medicine

## 2012-03-07 ENCOUNTER — Encounter (HOSPITAL_COMMUNITY): Payer: Self-pay

## 2012-03-07 ENCOUNTER — Encounter (HOSPITAL_COMMUNITY): Payer: Self-pay | Admitting: Anesthesiology

## 2012-03-07 DIAGNOSIS — I10 Essential (primary) hypertension: Secondary | ICD-10-CM | POA: Insufficient documentation

## 2012-03-07 DIAGNOSIS — F3289 Other specified depressive episodes: Secondary | ICD-10-CM | POA: Insufficient documentation

## 2012-03-07 DIAGNOSIS — G4730 Sleep apnea, unspecified: Secondary | ICD-10-CM | POA: Insufficient documentation

## 2012-03-07 DIAGNOSIS — J4489 Other specified chronic obstructive pulmonary disease: Secondary | ICD-10-CM | POA: Insufficient documentation

## 2012-03-07 DIAGNOSIS — K831 Obstruction of bile duct: Secondary | ICD-10-CM

## 2012-03-07 DIAGNOSIS — F411 Generalized anxiety disorder: Secondary | ICD-10-CM | POA: Insufficient documentation

## 2012-03-07 DIAGNOSIS — Z9089 Acquired absence of other organs: Secondary | ICD-10-CM | POA: Insufficient documentation

## 2012-03-07 DIAGNOSIS — Z79899 Other long term (current) drug therapy: Secondary | ICD-10-CM | POA: Insufficient documentation

## 2012-03-07 DIAGNOSIS — F329 Major depressive disorder, single episode, unspecified: Secondary | ICD-10-CM | POA: Insufficient documentation

## 2012-03-07 DIAGNOSIS — K219 Gastro-esophageal reflux disease without esophagitis: Secondary | ICD-10-CM | POA: Insufficient documentation

## 2012-03-07 DIAGNOSIS — Z86718 Personal history of other venous thrombosis and embolism: Secondary | ICD-10-CM | POA: Insufficient documentation

## 2012-03-07 DIAGNOSIS — Z4689 Encounter for fitting and adjustment of other specified devices: Secondary | ICD-10-CM | POA: Insufficient documentation

## 2012-03-07 HISTORY — PX: ENDOSCOPIC RETROGRADE CHOLANGIOPANCREATOGRAPHY (ERCP) WITH PROPOFOL: SHX5810

## 2012-03-07 LAB — COMPREHENSIVE METABOLIC PANEL
ALT: 12 U/L (ref 0–53)
AST: 12 U/L (ref 0–37)
Albumin: 3.6 g/dL (ref 3.5–5.2)
Alkaline Phosphatase: 85 U/L (ref 39–117)
BUN: 19 mg/dL (ref 6–23)
Potassium: 3.9 mEq/L (ref 3.5–5.3)
Sodium: 141 mEq/L (ref 135–145)
Total Protein: 7.1 g/dL (ref 6.0–8.3)

## 2012-03-07 SURGERY — ENDOSCOPIC RETROGRADE CHOLANGIOPANCREATOGRAPHY (ERCP) WITH PROPOFOL
Anesthesia: General

## 2012-03-07 MED ORDER — FENTANYL CITRATE 0.05 MG/ML IJ SOLN
INTRAMUSCULAR | Status: DC | PRN
  Administered 2012-03-07: 50 ug via INTRAVENOUS

## 2012-03-07 MED ORDER — LACTATED RINGERS IV SOLN
INTRAVENOUS | Status: DC | PRN
  Administered 2012-03-07: 11:00:00 via INTRAVENOUS

## 2012-03-07 MED ORDER — CIPROFLOXACIN IN D5W 400 MG/200ML IV SOLN
INTRAVENOUS | Status: AC
  Filled 2012-03-07: qty 200

## 2012-03-07 MED ORDER — CIPROFLOXACIN IN D5W 400 MG/200ML IV SOLN
400.0000 mg | Freq: Two times a day (BID) | INTRAVENOUS | Status: DC
  Administered 2012-03-07: 400 mg via INTRAVENOUS

## 2012-03-07 MED ORDER — EPHEDRINE SULFATE 50 MG/ML IJ SOLN
INTRAMUSCULAR | Status: DC | PRN
  Administered 2012-03-07: 10 mg via INTRAVENOUS

## 2012-03-07 MED ORDER — ONDANSETRON HCL 4 MG/2ML IJ SOLN
INTRAMUSCULAR | Status: DC | PRN
  Administered 2012-03-07: 4 mg via INTRAVENOUS

## 2012-03-07 MED ORDER — SODIUM CHLORIDE 0.9 % IV SOLN
INTRAVENOUS | Status: DC | PRN
  Administered 2012-03-07: 12:00:00

## 2012-03-07 MED ORDER — SODIUM CHLORIDE 0.9 % IV SOLN: INTRAVENOUS | Status: DC

## 2012-03-07 MED ORDER — PROPOFOL 10 MG/ML IV BOLUS
INTRAVENOUS | Status: DC | PRN
  Administered 2012-03-07: 80 mg via INTRAVENOUS

## 2012-03-07 MED ORDER — LACTATED RINGERS IV SOLN
INTRAVENOUS | Status: DC
  Administered 2012-03-07: 10:00:00 via INTRAVENOUS

## 2012-03-07 MED ORDER — SUCCINYLCHOLINE CHLORIDE 20 MG/ML IJ SOLN
INTRAMUSCULAR | Status: DC | PRN
  Administered 2012-03-07: 100 mg via INTRAVENOUS

## 2012-03-07 NOTE — Telephone Encounter (Signed)
Please advise xanax refill? I don't se were Dr Rodena Medin has refilled it?  If ok send to [phone]

## 2012-03-07 NOTE — Telephone Encounter (Signed)
OK to give order to continue services for next week

## 2012-03-07 NOTE — Telephone Encounter (Signed)
He has refilled xanax previously, OK to refill with same number 1 rf

## 2012-03-07 NOTE — Anesthesia Preprocedure Evaluation (Signed)
Anesthesia Evaluation  Patient identified by MRN, date of birth, ID band Patient awake    Reviewed: Allergy & Precautions, H&P , NPO status , Patient's Chart, lab work & pertinent test results  Airway Mallampati: II TM Distance: >3 FB Neck ROM: Full    Dental no notable dental hx.    Pulmonary shortness of breath, sleep apnea , pneumonia -, COPD COPD inhaler, Recent URI ,  breath sounds clear to auscultation  Pulmonary exam normal       Cardiovascular hypertension, Pt. on home beta blockers and Pt. on medications + Peripheral Vascular Disease + dysrhythmias Rhythm:Regular Rate:Normal     Neuro/Psych PSYCHIATRIC DISORDERS Anxiety Depression negative neurological ROS     GI/Hepatic Neg liver ROS, GERD-  ,  Endo/Other  negative endocrine ROS  Renal/GU negative Renal ROS  negative genitourinary   Musculoskeletal negative musculoskeletal ROS (+)   Abdominal   Peds negative pediatric ROS (+)  Hematology negative hematology ROS (+)   Anesthesia Other Findings   Reproductive/Obstetrics negative OB ROS                           Anesthesia Physical Anesthesia Plan  ASA: III  Anesthesia Plan: General   Post-op Pain Management:    Induction: Intravenous  Airway Management Planned: Oral ETT  Additional Equipment:   Intra-op Plan:   Post-operative Plan: Extubation in OR  Informed Consent: I have reviewed the patients History and Physical, chart, labs and discussed the procedure including the risks, benefits and alternatives for the proposed anesthesia with the patient or authorized representative who has indicated his/her understanding and acceptance.   Dental advisory given  Plan Discussed with: CRNA  Anesthesia Plan Comments:         Anesthesia Quick Evaluation

## 2012-03-07 NOTE — Telephone Encounter (Signed)
Refill- alprazolam 0.25mg  tab. Take one tablet by mouth three times daily as needed for anxiety. Qty 90 last fill 1.1.14

## 2012-03-07 NOTE — Interval H&P Note (Signed)
History and Physical Interval Note:  03/07/2012 10:00 AM  Steve [name]  has presented today for surgery, with the diagnosis of Bile duct obstruction [576.2]  The various methods of treatment have been discussed with the patient and family. After consideration of risks, benefits and other options for treatment, the patient has consented to  Procedure(s) with comments: ENDOSCOPIC RETROGRADE CHOLANGIOPANCREATOGRAPHY (ERCP) WITH PROPOFOL (N/A) - remove stent as a surgical intervention .  The patient's history has been reviewed, patient examined, no change in status, stable for surgery.  I have reviewed the patient's chart and labs.  Questions were answered to the patient's satisfaction.     Rob Bunting

## 2012-03-07 NOTE — H&P (View-Only) (Signed)
Review of pertinent gastrointestinal problems: 1. Gallstone disease:  Acute cholecystitis S/p 01/19/11 lap chole lysis of dense adhesions (Dr. Violeta Gelinas) required to dissect out GB and repair of CHD. During the dissection he noted "a small amount of bile staining below the cystic duct. Close inspection in that area revealed a small pinhole in the common hepatic duct." This was repaired. Follow up ERCP attempt by me to place CBD, CHD stent was unsuccessful however Dr. Arlyce Dice was able to accomplish it the next day (placed 7cm long 10Fr stent in bile duct and PD stent that spontaneously migrated).  HPI: This is a  very pleasant 79 year old man whom I last saw when he was hospitalized at [facility] with acute cholecystitis. See a summary of that encounter above.  Liver tests checked last week were all normal.  Since leaving the hospital, has pain with eating in his esophagus.  Mild pain in chest with eating.      Past Medical History  Diagnosis Date  . Prostate enlargement   . GERD (gastroesophageal reflux disease)   . Arthritis     knee and back  . AAA (abdominal aortic aneurysm)     stress test 09/14/10 EPIC  . Anxiety   . Dysrhythmia     hx of atrial fib post op x 2 days   . COPD (chronic obstructive pulmonary disease)   . Shortness of breath     with exertion   . Recurrent upper respiratory infection (URI)     productive cough- white phlegm- no fever  . Sleep apnea      12/12 sleep study,SEVERE per study  Dr Delton Coombes- states doesnt wear machine on regular basis- setting BiPAP 9/9  . Hypertension     chest x ray 12/12 EPIC- repeated 06/06/11, EKG 11/12 EPIC  . DVT (deep venous thrombosis) 08/2011  . Pneumonia   . Bruises easily     takes Xarelto  . Atrial fibrillation   . Mesothelioma   . Depression   . HTN (hypertension)     Past Surgical History  Procedure Date  . Knee surgery 1957    left knee arthotomy  . Abdominal aortic aneurysm repair 11/14/2010    AAA Repair    Dr  Arbie Cookey  . Transurethral resection of prostate 01/25/2011    TURP  . Incisional hernia repair 06/12/2011    Procedure: HERNIA REPAIR INCISIONAL;  Surgeon: Velora Heckler, MD;  Location: [facility] ORS;  Service: General;  Laterality: N/A;  Open Repair Ventral Incisional Hernia with Mesh  . Hernia repair   . Video assisted thoracoscopy 11/02/2011    Procedure: VIDEO ASSISTED THORACOSCOPY;  Surgeon: Loreli Slot, MD;  Location: [facility] OR;  Service: Thoracic;  Laterality: Right;  . Pleural biopsy 11/02/2011    Procedure: PLEURAL BIOPSY;  Surgeon: Loreli Slot, MD;  Location: [facility] OR;  Service: Thoracic;  Laterality: Right;  Parietal and visceral biopsies  . Chest tube insertion 11/02/2011    Procedure: INSERTION PLEURAL DRAINAGE CATHETER;  Surgeon: Loreli Slot, MD;  Location: [facility] OR;  Service: Thoracic;  Laterality: Right;  . Decortication 11/02/2011    Procedure: DECORTICATION;  Surgeon: Loreli Slot, MD;  Location: [facility] OR;  Service: Thoracic;  Laterality: Right;  . Pleural effusion drainage 11/02/2011    Procedure: DRAINAGE OF PLEURAL EFFUSION;  Surgeon: Loreli Slot, MD;  Location: [facility] OR;  Service: Thoracic;  Laterality: Right;  . Cholecystectomy 01/19/2012    Procedure: LAPAROSCOPIC CHOLECYSTECTOMY WITH INTRAOPERATIVE CHOLANGIOGRAM;  Surgeon:  Liz Malady, MD;  Location: [facility] OR;  Service: General;  Laterality: N/A;  laparoscopic cholecystectomy with intraoperative cholangiogram  . Ercp 01/22/2012    Procedure: ENDOSCOPIC RETROGRADE CHOLANGIOPANCREATOGRAPHY (ERCP);  Surgeon: Louis Meckel, MD;  Location: [facility] OR;  Service: Endoscopy;  Laterality: N/A;  with stent placement  . Ercp 01/21/2012    Procedure: ENDOSCOPIC RETROGRADE CHOLANGIOPANCREATOGRAPHY (ERCP);  Surgeon: Rachael Fee, MD;  Location: [facility] OR;  Service: Gastroenterology;  Laterality: N/A;  . Esophagogastroduodenoscopy 01/24/2012    Procedure: ESOPHAGOGASTRODUODENOSCOPY (EGD);  Surgeon: Louis Meckel, MD;   Location: [facility] ENDOSCOPY;  Service: Endoscopy;  Laterality: N/A;  remove pancr stent     Current Outpatient Prescriptions  Medication Sig Dispense Refill  . albuterol (PROVENTIL HFA;VENTOLIN HFA) 108 (90 BASE) MCG/ACT inhaler Inhale 2 puffs into the lungs every 6 (six) hours as needed for wheezing.  1 Inhaler  2  . ALPRAZolam (XANAX) 0.25 MG tablet Take 0.25 mg by mouth at bedtime.      . metoprolol tartrate (LOPRESSOR) 12.5 mg TABS Take 12.5 mg by mouth 2 (two) times daily.      . Rivaroxaban (XARELTO) 20 MG TABS Take 1 tablet (20 mg total) by mouth every morning.  30 tablet  3  . senna (SENOKOT) 8.6 MG TABS Take 2 tablets by mouth at bedtime.      . sertraline (ZOLOFT) 100 MG tablet Take 1 tablet (100 mg total) by mouth daily.  30 tablet  3  . SPIRIVA HANDIHALER 18 MCG inhalation capsule INHALE ONE DOSE BY MOUTH EVERY DAY  30 capsule  0    Allergies as of 02/13/2012 - Review Complete 02/13/2012  Allergen Reaction Noted  . Lyrica (pregabalin) Hives and Itching 09/14/2010    Family History  Problem Relation Age of Onset  . Arthritis Mother   . Heart failure Mother   . Hypertension Mother   . Diabetes Mother   . Heart failure Daughter     deceased age 67  . Other Daughter     deceased age 64 MVA    History   Social History  . Marital Status: Married    Spouse Name: N/A    Number of Children: 6  . Years of Education: N/A   Occupational History  . retired    Social History Main Topics  . Smoking status: Former Smoker -- 0.5 packs/day for 60 years    Types: Cigarettes    Quit date: 01/10/2012  . Smokeless tobacco: Never Used     Comment: pt using electric cig  . Alcohol Use: No  . Drug Use: No  . Sexually Active: Not on file   Other Topics Concern  . Not on file   Social History Narrative  . No narrative on file      Physical Exam: BP 134/60  Pulse 60  Ht 5' 6.75" (1.695 m)  Wt 189 lb 2 oz (85.787 kg)  BMI 29.84 kg/m2 Constitutional: generally  well-appearing Psychiatric: alert and oriented x3 Abdomen: soft, nontender, nondistended, no obvious ascites, no peritoneal signs, normal bowel sounds     Assessment and plan: 79 y.o. male with biliary stent in place do to bile duct injury during cholecystectomy last month  We will plan on ERCP later this month which will give him about 6 weeks of indwelling biliary stent. That should be placed time to allow the site he'll and hopefully prevent postoperative stricturing. He is on xarelto for previous DVTs, this is managed by Dr. Christin Bach  and we will get in touch with him about the safety of holding this blood thinner for 3 days prior to the ERCP. He may asked that the patient be on a Lovenox bridge.

## 2012-03-07 NOTE — Telephone Encounter (Signed)
I called his wife and gave her the results of the thyroid biopsy. 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  We need to refer him to surgery. I have a call in to Dr. Darnell Level and he is supposed to call me back tomorrow.  If [name] is to have a thyroidectomy or a partial thyroidectomy, he is on Xarelto and needs to come off this 2 days prior to the procedure.  Hopefully, [name] will not need to see an endocrinologist. If so, a referral can made to one of the endocrinologists in [place].  Cindee Lame

## 2012-03-07 NOTE — Transfer of Care (Signed)
Immediate Anesthesia Transfer of Care Note  Patient: Steve [name]  Procedure(s) Performed: Procedure(s) with comments: ENDOSCOPIC RETROGRADE CHOLANGIOPANCREATOGRAPHY (ERCP) WITH PROPOFOL (N/A) - remove stent  Patient Location: PACU and Endoscopy Unit  Anesthesia Type:General  Level of Consciousness: awake, alert  and patient cooperative  Airway & Oxygen Therapy: Patient Spontanous Breathing and Patient connected to face mask oxygen  Post-op Assessment: Report given to PACU RN and Post -op Vital signs reviewed and stable  Post vital signs: Reviewed and stable  Complications: No apparent anesthesia complications

## 2012-03-07 NOTE — Telephone Encounter (Signed)
Steve [name] with [facility] stated she has not seen the patient this week due to patient having a procedure. Tiffany would like 1 more week verbally added to pts order to finish next week then be able to discharge pt.  Please advise?   Callback [phone]

## 2012-03-07 NOTE — Preoperative (Signed)
Beta Blockers   Reason not to administer Beta Blockers:Not Applicable 

## 2012-03-07 NOTE — Op Note (Signed)
[facility] [address] [place], [address]   ERCP PROCEDURE REPORT  PATIENT: Steve, [name].  MR# :[id number] BIRTHDATE: Aug 12, 1933  GENDER: Male ENDOSCOPIST: Rachael Fee, MD PROCEDURE DATE:  03/07/2012 PROCEDURE:   ERCP with foreign body removal, stent removal ASA CLASS:   Class III INDICATIONS:lap chole about 6-7 weeks ago, bile duct injury during surgery that was repaired surgically, post surgery a biliary stent was placed across the area of damage. MEDICATIONS: General endotracheal anesthesia (GETA) and Cipro 400 mg IV TOPICAL ANESTHETIC: Cetacaine Spray  DESCRIPTION OF PROCEDURE:   After the risks benefits and alternatives of the procedure were thoroughly explained, informed consent was obtained.  The [id number]>  endoscope was introduced through the mouth  and advanced to the second portion of the duodenum  without detailed examination of the UGI tract.  The previously placed 7cm long 10Fr biliary stent was protruding across the major papilla (2-3cm into the duodenum).  This stent was removed with snare. The major papilla showed evidence of previous sphincterotomy. A 44 Autotome over .035 hydrawire was used to cannulate the bile duct.  The pancraetic duct was never injected with dye or cannulated with wire. Cholangiogram showed non-dilated biliary tree. There were 3-4 surgical clips adacent to the common hepatic duct that are likely the clips used to repair the duct. There was no clear stricture present at that site and no extravasation of dye from the biliary tree. The scope was then completely withdrawn from the patient and the procedure terminated.     COMPLICATIONS: none  ENDOSCOPIC IMPRESSION: Removal of previously placed biliary stent, no clear stricture in biliary tree (the site of bile duct repair carefully examined) and no biliary leak.  RECOMMENDATIONS: Follow clinically    _______________________________ eSigned:   Rachael Fee, MD 03/07/2012 11:35 AM   XB:JYNWG [name], MD

## 2012-03-07 NOTE — Progress Notes (Signed)
This office note has been dictated.

## 2012-03-08 ENCOUNTER — Encounter (HOSPITAL_COMMUNITY): Payer: Self-pay | Admitting: Gastroenterology

## 2012-03-08 MED ORDER — ALPRAZOLAM 0.25 MG PO TABS: 0.2500 mg | ORAL_TABLET | Freq: Every day | ORAL | Status: DC

## 2012-03-08 NOTE — Progress Notes (Signed)
CC:   Marguarite Arbour, MD Gabrielle Dare. Steve [name], M.D. Salvatore Decent Dorris Fetch, M.D.  DIAGNOSES: 1. New diagnosis of papillary carcinoma of the thyroid. 2. Mesothelioma of the right lung. 3. 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 4. Factor V Leiden mutation with deep vein thrombosis of the right     leg. 5. Lupus anticoagulant positive.  CURRENT THERAPY:  Xarelto 20 mg p.o. daily.  INTERIM HISTORY:  Steve [name] comes in for followup.  Unfortunately, every time we evaluate him, we find something new to worry about.  We did repeat a PET scan on him.  This was so that we could follow up on the mesothelioma.  This was done on February 10th.  There was no evidence of activity within the mediastinum.  However, there was found to be a hypermetabolic nodule in the left lobe of the thyroid.  This was likely felt to be malignant.  We did go ahead and get a biopsy of this.  This was done on the 25th of February.  The biopsy report ([id number]) showed papillary carcinoma.  He is doing okay overall.  He has had no bleeding.  He is on Xarelto.  We also have to worry about his monoclonal gammopathy.  This is followed every couple of months.  His monoclonal spike continues to go up.  It was 1.35 g/dL back in January.  His IgA was 1570 mg/dL.  Lambda light chain was 9.06 mg/dL.  We did do a, I think, bone marrow biopsy on him.  This was done back in September.  He did have 5% plasma cells with lambda light chain restriction.  His appetite is doing okay.  He is not hurting anywhere.  He did, I think, have a PleurX catheter in his right side, but this has been removed.  He has no leg pain.  He did have cholecystitis back in, I think, December.  He had a cholecystectomy.  There was some bleeding that he had with his left nipple.  We had to get a mammogram for him.  The mammogram was done on February 5th, the mammogram showed minimal symmetric bilateral gynecomastia.   No suspicious findings were noted.  PHYSICAL EXAMINATION:  General:  This is an elderly white gentleman in no obvious distress.  Vital signs:  Temperature of 97.2, pulse 60, respiratory rate 18, blood pressure 184/66.  Weight is 192.  Head and neck:  Normocephalic, atraumatic skull.  There are no ocular or oral lesions.  There are no palpable cervical or supraclavicular lymph nodes. Lungs:  Slight decrease on the right side.  He has good air movement, however.  Cardiac:  Regular rate and rhythm with a normal S1 and S2. There are no murmurs, rubs, or bruits.  Abdomen:  Soft with good bowel sounds.  There is no palpable abdominal mass.  He has no fluid wave.  No palpable hepatosplenomegaly.  Extremities:  No clubbing, cyanosis, or edema.  No venous cord is noted in the right leg.  He has good range of motion of his joints.  Skin:  No rashes, ecchymoses, or petechiae.  LABORATORY STUDIES:  White cell count is 6.8, hemoglobin 13.5, hematocrit 41.3, platelet count is 149.  His prealbumin is 19.2.  D-dimer is 0.78.  IMPRESSION:  Steve [name] is a 79 year old gentleman.  He now has a second malignancy.  I did leave a message with Dr. Gerrit [name] at [facility].  I think he does the thyroid surgeries there.  If not, Dr. Janee [name] might  be able to see him again.  I am glad that the mesothelioma has not been a problem for him.  We can hold off on any evaluation on this unless he starts developing any symptoms.  We will continue his Xarelto.  I probably will keep him on the Xarelto for a year.  He is heterozygous for the factor V Leiden mutation.  He also has a positive lupus anticoagulant.  I just feel bad for Mr. Baller.  It seems like we are always finding something going on with him.  We will probably plan to see him back in another 6-8 weeks.  By then, his thyroid will have been removed or partially removed.    ______________________________ Steve [name], M.D. PRE/MEDQ   D:  03/07/2012  T:  03/08/2012  Job:  [id number]

## 2012-03-08 NOTE — Telephone Encounter (Signed)
Left a detailed message on patients voicemail.

## 2012-03-11 NOTE — Telephone Encounter (Signed)
Message wasn't left on patients vm, it was left on Steve [name]'s

## 2012-03-15 ENCOUNTER — Encounter (INDEPENDENT_AMBULATORY_CARE_PROVIDER_SITE_OTHER): Payer: Medicare Other | Admitting: Surgery

## 2012-03-20 ENCOUNTER — Encounter (INDEPENDENT_AMBULATORY_CARE_PROVIDER_SITE_OTHER): Payer: Self-pay | Admitting: Surgery

## 2012-03-20 ENCOUNTER — Encounter (INDEPENDENT_AMBULATORY_CARE_PROVIDER_SITE_OTHER): Payer: Self-pay | Admitting: General Surgery

## 2012-03-20 ENCOUNTER — Ambulatory Visit (INDEPENDENT_AMBULATORY_CARE_PROVIDER_SITE_OTHER): Payer: Medicare Other | Admitting: General Surgery

## 2012-03-20 ENCOUNTER — Ambulatory Visit (INDEPENDENT_AMBULATORY_CARE_PROVIDER_SITE_OTHER): Payer: Medicare Other | Admitting: Surgery

## 2012-03-20 VITALS — BP 162/80 | HR 60 | Temp 97.5°F | Resp 24 | Ht 68.0 in | Wt 189.0 lb

## 2012-03-20 NOTE — Progress Notes (Signed)
Subjective:     Patient ID: Steve [name], male   DOB: 03-06-1933, 79 y.o.   MRN: [id number]  HPI Patient is here seeing Dr. [name] as well regarding papillary thyroid carcinoma on the left side.Patient underwent ERCP with common bile duct stent removal by Dr. Christella Hartigan. The area in his common hepatic duct appears to have completely healed. There was no evidence of stricture or abnormality per the report. He has no abdominal complaints. He is eating moving his bowels. His left ribs are a little sore after a fall.  Review of Systems     Objective:   Physical Exam Abdomen soft and nontender, incisions well healed. Mild tenderness but no contusion of her left ribs.    Assessment:     Status post arthroscopic cholecystectomy and laparoscopic common hepatic duct repair. Biliary stent non-removed with normal cholangiogram on ERCP. Left papillary thyroid carcinoma    Plan:     He is doing well from an abdominal standpoint. Dr. Blima Singer is proceeding with treatment for his papillary thyroid carcinoma. I will see him back myself as needed.

## 2012-03-20 NOTE — Progress Notes (Signed)
General Surgery [facility]  Chief Complaint  Patient presents with  . New Evaluation    eval thyroid cancer - referral from Dr. Arlan Organ    HISTORY: Patient is a 79 year old white male well known to our surgical practice. Patient is referred by his medical oncologist for newly diagnosed papillary thyroid carcinoma. Patient had undergone a followup PET scan for his previously diagnosed with mesothelioma. This showed hypermetabolic activity in the left thyroid lobe. Subsequent ultrasound showed a dominant 19 mm nodule. Fine needle aspiration biopsy confirmed papillary thyroid carcinoma. Patient is now referred for total thyroidectomy for management.  Patient has no prior history of thyroid disease. He has never been on thyroid medication. He has had no prior head or neck surgery. There is no family history of thyroid cancer. There is no history of other endocrinopathy such as pituitary tumors or adrenal tumors.  Past Medical History  Diagnosis Date  . Prostate enlargement   . GERD (gastroesophageal reflux disease)   . AAA (abdominal aortic aneurysm)     stress test 09/14/10 EPIC  . Anxiety   . COPD (chronic obstructive pulmonary disease)   . Shortness of breath     with exertion   . Recurrent upper respiratory infection (URI)     productive cough- white phlegm- no fever  . Hypertension     chest x ray 12/12 EPIC- repeated 06/06/11, EKG 11/12 EPIC  . DVT (deep venous thrombosis) 08/2011  . Pneumonia   . Bruises easily     takes Xarelto  . Depression   . HTN (hypertension)   . Dysrhythmia     hx of atrial fib post op x 2 days   . Atrial fibrillation   . Sleep apnea      12/12 sleep study,SEVERE per study  Dr Delton Coombes- states doesnt wear machine on regular basis- setting BiPAP 9/9  . Sleep apnea     o2 at night 2 liters  . Mesothelioma   . Arthritis     knee and back     Current Outpatient Prescriptions  Medication Sig Dispense Refill  . albuterol  (PROVENTIL HFA;VENTOLIN HFA) 108 (90 BASE) MCG/ACT inhaler Inhale 2 puffs into the lungs every 6 (six) hours as needed for wheezing.  1 Inhaler  2  . ALPRAZolam (XANAX) 0.25 MG tablet Take 1 tablet (0.25 mg total) by mouth at bedtime.  30 tablet  1  . metoprolol tartrate (LOPRESSOR) 25 MG tablet Take 12.5 mg by mouth 2 (two) times daily.      . Rivaroxaban (XARELTO) 20 MG TABS Take 1 tablet (20 mg total) by mouth every morning.  30 tablet  3  . senna (SENOKOT) 8.6 MG TABS Take 2 tablets by mouth at bedtime.      . sertraline (ZOLOFT) 100 MG tablet Take 100 mg by mouth daily before breakfast.      . SPIRIVA HANDIHALER 18 MCG inhalation capsule INHALE ONE CAPSULE EVERY DAY  30 capsule  2   No current facility-administered medications for this visit.     Allergies  Allergen Reactions  . Lyrica (Pregabalin) Hives and Itching     Family History  Problem Relation Age of Onset  . Arthritis Mother   . Heart failure Mother   . Hypertension Mother   . Diabetes Mother   . Heart failure Daughter     deceased age 16  . Other Daughter     deceased age 32 MVA     History  Social History  . Marital Status: Married    Spouse Name: N/A    Number of Children: 6  . Years of Education: N/A   Occupational History  . retired    Social History Main Topics  . Smoking status: Former Smoker -- 0.50 packs/day for 60 years    Types: Cigarettes    Quit date: 01/10/2012  . Smokeless tobacco: Never Used     Comment: pt using electric cig  . Alcohol Use: No  . Drug Use: No  . Sexually Active: None   Other Topics Concern  . None   Social History Narrative  . None     REVIEW OF SYSTEMS - PERTINENT POSITIVES ONLY: Denies tremors. Denies palpitations. Denies compressive symptoms. Denies voice changes.  EXAM: Filed Vitals:   03/20/12 0909  BP: 162/80  Pulse: 60  Temp: 97.5 F (36.4 C)  Resp: 24    HEENT: normocephalic; pupils equal and reactive; sclerae clear; dentition good;  mucous membranes moist NECK:  Palpable firm mass in the left thyroid lobe, mobile with swallowing, nontender; symmetric on extension; no palpable anterior or posterior cervical lymphadenopathy; no supraclavicular masses; no tenderness CHEST: clear to auscultation bilaterally without rales, rhonchi, or wheezes CARDIAC: irregular rate and rhythm without significant murmur; peripheral pulses are full EXT:  non-tender without edema; no deformity NEURO: no gross focal deficits; no sign of tremor   LABORATORY RESULTS: See [facility] (CHL-Epic) for most recent results   RADIOLOGY RESULTS: See [facility] (CHL-Epic) for most recent results   IMPRESSION: Papillary thyroid carcinoma, left thyroid lobe, 19 mm  PLAN: I discussed at length with the patient and his wife the indications for surgery. We discussed total thyroidectomy with limited lymph node dissection. We discussed potential complications including recurrent nerve injury and injury to parathyroid glands. I explained to them that his incidence of recurrent nerve injury may be as high as 5% given the location of his malignancy. We discussed the postoperative course to be anticipated and the need for treatment with radioactive iodine. They understand and wish to proceed in the near future.  The risks and benefits of the procedure have been discussed at length with the patient.  The patient understands the proposed procedure, potential alternative treatments, and the course of recovery to be expected.  All of the patient's questions have been answered at this time.  The patient wishes to proceed with surgery.  Velora Heckler, MD, FACS General & Endocrine Surgery [facility]   Visit Diagnoses: 1. Thyroid cancer, papillary     Primary Care Physician: Letitia Libra, Ala Dach, MD

## 2012-03-20 NOTE — Patient Instructions (Signed)

## 2012-03-21 ENCOUNTER — Encounter (HOSPITAL_COMMUNITY): Payer: Self-pay

## 2012-03-21 ENCOUNTER — Ambulatory Visit: Payer: Medicare Other | Admitting: Family Medicine

## 2012-03-24 NOTE — Patient Instructions (Signed)
GRYFFIN ALTICE  03/24/2012   Your procedure is scheduled on: 04/04/12    Report to [facility] at   1030  AM.  Call this number if you have problems the morning of surgery: [phone]   Remember:   Do not eat food or drink liquids after midnight.   Take these medicines the morning of surgery with A SIP OF WATER:    Do not wear jewelry,   Do not wear lotions, powders, or perfumes.   . Men may shave face and neck.  Do not bring valuables to the hospital.  Contacts, dentures or bridgework may not be worn into surgery.  Leave suitcase in the car. After surgery it may be brought to your room.  For patients admitted to the hospital, checkout time is 11:00 AM the day of  discharge.   SEE CHG INSTRUCTION SHEET    Please read over the following fact sheets that you were given: MRSA Information, coughing and deep breathing exercises, leg exercises               Failure to comply with these instructions may result in cancellation of your surgery.                Patient Signature ____________________________              Nurse Signature _____________________________

## 2012-03-25 ENCOUNTER — Encounter (HOSPITAL_COMMUNITY)
Admission: RE | Admit: 2012-03-25 | Discharge: 2012-03-25 | Disposition: A | Discharge status: Home / Self Care | Payer: Medicare Other | Source: Ambulatory Visit | Attending: Surgery | Admitting: Surgery

## 2012-03-25 ENCOUNTER — Ambulatory Visit (HOSPITAL_COMMUNITY)
Admission: RE | Admit: 2012-03-25 | Discharge: 2012-03-25 | Disposition: A | Discharge status: Home / Self Care | Payer: Medicare Other | Source: Ambulatory Visit | Attending: Surgery | Admitting: Surgery

## 2012-03-25 ENCOUNTER — Telehealth: Payer: Self-pay | Admitting: Internal Medicine

## 2012-03-25 ENCOUNTER — Encounter (HOSPITAL_COMMUNITY): Payer: Self-pay

## 2012-03-25 DIAGNOSIS — Z85118 Personal history of other malignant neoplasm of bronchus and lung: Secondary | ICD-10-CM | POA: Insufficient documentation

## 2012-03-25 DIAGNOSIS — Z01818 Encounter for other preprocedural examination: Secondary | ICD-10-CM | POA: Insufficient documentation

## 2012-03-25 DIAGNOSIS — I10 Essential (primary) hypertension: Secondary | ICD-10-CM | POA: Insufficient documentation

## 2012-03-25 DIAGNOSIS — Z01812 Encounter for preprocedural laboratory examination: Secondary | ICD-10-CM | POA: Insufficient documentation

## 2012-03-25 DIAGNOSIS — E041 Nontoxic single thyroid nodule: Secondary | ICD-10-CM | POA: Insufficient documentation

## 2012-03-25 HISTORY — DX: Personal history of other diseases of the digestive system: Z87.19

## 2012-03-25 LAB — BASIC METABOLIC PANEL
BUN: 19 mg/dL (ref 6–23)
Chloride: 104 mEq/L (ref 96–112)
Creatinine, Ser: 1.14 mg/dL (ref 0.50–1.35)
GFR calc Af Amer: 70 mL/min — ABNORMAL LOW (ref >90)
GFR calc non Af Amer: 61 mL/min — ABNORMAL LOW (ref >90)

## 2012-03-25 LAB — CBC
HCT: 43.8 % (ref 39.0–52.0)
MCHC: 32.4 g/dL (ref 30.0–36.0)
MCV: 93.4 fL (ref 78.0–100.0)
Platelets: 212 10*3/uL (ref 150–400)
RDW: 14.7 % (ref 11.5–15.5)
WBC: 7.2 10*3/uL (ref 4.0–10.5)

## 2012-03-25 LAB — SURGICAL PCR SCREEN: Staphylococcus aureus: NEGATIVE

## 2012-03-25 NOTE — Telephone Encounter (Signed)
Alprazolam 0.25 mg tab take 1 tablet by mouth three times daily  Qty 90

## 2012-03-25 NOTE — Telephone Encounter (Signed)
RX was wrote on 03-08-12 with 1 refill - isn't due for refill yet

## 2012-03-25 NOTE — Progress Notes (Signed)
Quick Note:  These results are acceptable for scheduled surgery.  Breland Elders M. Janny Crute, MD, FACS [facility] Office: [phone]   ______ 

## 2012-03-25 NOTE — Progress Notes (Signed)
ECHO 06/18/11 EPIC Stress 09/14/10 in [place]

## 2012-03-25 NOTE — Progress Notes (Signed)
Patient does not use CPAP machine.  CPAP machine returned per patient.

## 2012-03-26 ENCOUNTER — Encounter (HOSPITAL_COMMUNITY): Payer: Self-pay

## 2012-03-28 ENCOUNTER — Ambulatory Visit (INDEPENDENT_AMBULATORY_CARE_PROVIDER_SITE_OTHER): Payer: Medicare Other | Admitting: Family Medicine

## 2012-03-28 ENCOUNTER — Encounter: Payer: Self-pay | Admitting: Family Medicine

## 2012-03-28 VITALS — BP 144/76 | HR 59 | Temp 97.6°F | Ht 68.0 in | Wt 193.0 lb

## 2012-03-28 DIAGNOSIS — I498 Other specified cardiac arrhythmias: Secondary | ICD-10-CM

## 2012-03-28 DIAGNOSIS — C73 Malignant neoplasm of thyroid gland: Secondary | ICD-10-CM

## 2012-03-28 DIAGNOSIS — I10 Essential (primary) hypertension: Secondary | ICD-10-CM

## 2012-03-28 DIAGNOSIS — Z720 Tobacco use: Secondary | ICD-10-CM

## 2012-03-28 DIAGNOSIS — I480 Paroxysmal atrial fibrillation: Secondary | ICD-10-CM

## 2012-03-28 DIAGNOSIS — F17200 Nicotine dependence, unspecified, uncomplicated: Secondary | ICD-10-CM

## 2012-03-28 DIAGNOSIS — I4891 Unspecified atrial fibrillation: Secondary | ICD-10-CM

## 2012-03-28 DIAGNOSIS — R001 Bradycardia, unspecified: Secondary | ICD-10-CM

## 2012-03-28 NOTE — Patient Instructions (Addendum)
Probiotic daily Digestive Advantage, by Schiff Add Benefiber twice a day to control constipation  Constipation, Adult Constipation is when a person has fewer than 3 bowel movements a week; has difficulty having a bowel movement; or has stools that are dry, hard, or larger than normal. As people grow older, constipation is more common. If you try to fix constipation with medicines that make you have a bowel movement (laxatives), the problem may get worse. Long-term laxative use may cause the muscles of the colon to become weak. A low-fiber diet, not taking in enough fluids, and taking certain medicines may make constipation worse. CAUSES   Certain medicines, such as antidepressants, pain medicine, iron supplements, antacids, and water pills.   Certain diseases, such as diabetes, irritable bowel syndrome (IBS), thyroid disease, or depression.   Not drinking enough water.   Not eating enough fiber-rich foods.   Stress or travel.  Lack of physical activity or exercise.  Not going to the restroom when there is the urge to have a bowel movement.  Ignoring the urge to have a bowel movement.  Using laxatives too much. SYMPTOMS   Having fewer than 3 bowel movements a week.   Straining to have a bowel movement.   Having hard, dry, or larger than normal stools.   Feeling full or bloated.   Pain in the lower abdomen.  Not feeling relief after having a bowel movement. DIAGNOSIS  Your caregiver will take a medical history and perform a physical exam. Further testing may be done for severe constipation. Some tests may include:   A barium enema X-ray to examine your rectum, colon, and sometimes, your small intestine.  A sigmoidoscopy to examine your lower colon.  A colonoscopy to examine your entire colon. TREATMENT  Treatment will depend on the severity of your constipation and what is causing it. Some dietary treatments include drinking more fluids and eating more fiber-rich  foods. Lifestyle treatments may include regular exercise. If these diet and lifestyle recommendations do not help, your caregiver may recommend taking over-the-counter laxative medicines to help you have bowel movements. Prescription medicines may be prescribed if over-the-counter medicines do not work.  HOME CARE INSTRUCTIONS   Increase dietary fiber in your diet, such as fruits, vegetables, whole grains, and beans. Limit high-fat and processed sugars in your diet, such as [name] fries, hamburgers, cookies, candies, and soda.   A fiber supplement may be added to your diet if you cannot get enough fiber from foods.   Drink enough fluids to keep your urine clear or pale yellow.   Exercise regularly or as directed by your caregiver.   Go to the restroom when you have the urge to go. Do not hold it.  Only take medicines as directed by your caregiver. Do not take other medicines for constipation without talking to your caregiver first. SEEK IMMEDIATE MEDICAL CARE IF:   You have bright red blood in your stool.   Your constipation lasts for more than 4 days or gets worse.   You have abdominal or rectal pain.   You have thin, pencil-like stools.  You have unexplained weight loss. MAKE SURE YOU:   Understand these instructions.  Will watch your condition.  Will get help right away if you are not doing well or get worse. Document Released: 09/24/2003 Document Revised: 03/20/2011 Document Reviewed: 11/29/2010 [facility] Patient Information 2013 [place], [place].

## 2012-03-29 ENCOUNTER — Telehealth: Payer: Self-pay | Admitting: Internal Medicine

## 2012-03-29 NOTE — Telephone Encounter (Signed)
Refill- alprazolam 0.25mg  tab. Take one tablet by mouth three times daily as needed for anxiety. Qty 90 last fill 1.1.14

## 2012-03-29 NOTE — Telephone Encounter (Signed)
Current med list states 1 tablet at bedtime. Please advise correct directions.

## 2012-03-31 NOTE — Assessment & Plan Note (Signed)
Unfortunately still smoking 1 ppd. Patient unwilling to commit to quitting at this visit.

## 2012-03-31 NOTE — Assessment & Plan Note (Signed)
Is scheduled for surgery soon for further treatment.

## 2012-03-31 NOTE — Assessment & Plan Note (Signed)
Very mild and asymptomatic

## 2012-03-31 NOTE — Progress Notes (Signed)
Patient ID: Steve [name], male   DOB: 06-26-1933, 79 y.o.   MRN: [id number] JAVONN GAUGER [id number] October 25, 1933 03/31/2012      Progress Note-Follow Up  Subjective  Chief Complaint  Chief Complaint  Patient presents with  . Follow-up    6 week     HPI  Patient is a 79 year old male who is in today for followup. He is accompanied by his wife. Is actually due to have thyroidectomy soon to treat his thyroid cancer. Acknowledges he is anxious but feels ready to proceed. Unfortunately continues to smoke a pack per day and struggles with shortness of breath and fatigue. No fevers or chills. No headache or palpitations. No worsening shortness or breath or chest pain. No GI or GU complaints today  Past Medical History  Diagnosis Date  . Prostate enlargement   . GERD (gastroesophageal reflux disease)   . AAA (abdominal aortic aneurysm)     stress test 09/14/10 EPIC  . Anxiety   . Shortness of breath     with exertion   . Recurrent upper respiratory infection (URI)     productive cough- white phlegm- no fever  . Hypertension     chest x ray 12/12 EPIC- repeated 06/06/11, EKG 11/12 EPIC  . DVT (deep venous thrombosis) 08/2011  . Pneumonia   . Bruises easily     takes Xarelto  . Depression   . HTN (hypertension)   . Dysrhythmia     hx of atrial fib post op x 2 days   . Atrial fibrillation   . Mesothelioma   . Arthritis     knee and back  . Sleep apnea      12/12 sleep study,SEVERE per study  Dr Delton Coombes- states doesnt wear machine on regular basis- setting BiPAP 9/9  . Sleep apnea     o2 at night 2 liters  . H/O hiatal hernia   . COPD (chronic obstructive pulmonary disease)     patient uses oxygen 2L/[place] at nite     Past Surgical History  Procedure Laterality Date  . Knee surgery  1957    left knee arthotomy  . Abdominal aortic aneurysm repair  11/14/2010    AAA Repair    Dr Arbie Cookey  . Transurethral resection of prostate  01/25/2011    TURP  . Incisional hernia repair  06/12/2011     Procedure: HERNIA REPAIR INCISIONAL;  Surgeon: Velora Heckler, MD;  Location: [facility] ORS;  Service: General;  Laterality: N/A;  Open Repair Ventral Incisional Hernia with Mesh  . Hernia repair    . Video assisted thoracoscopy  11/02/2011    Procedure: VIDEO ASSISTED THORACOSCOPY;  Surgeon: Loreli Slot, MD;  Location: [facility] OR;  Service: Thoracic;  Laterality: Right;  . Pleural biopsy  11/02/2011    Procedure: PLEURAL BIOPSY;  Surgeon: Loreli Slot, MD;  Location[facility] OR;  Service: Thoracic;  Laterality: Right;  Parietal and visceral biopsies  . Chest tube insertion  11/02/2011    Procedure: INSERTION PLEURAL DRAINAGE CATHETER;  Surgeon: Loreli Slot, MD;  Location: [facility] OR;  Service: Thoracic;  Laterality: Right;  . Decortication  11/02/2011    Procedure: DECORTICATION;  Surgeon: Loreli Slot, MD;  Location: [facility] OR;  Service: Thoracic;  Laterality: Right;  . Pleural effusion drainage  11/02/2011    Procedure: DRAINAGE OF PLEURAL EFFUSION;  Surgeon: Loreli Slot, MD;  Location: [facility] OR;  Service: Thoracic;  Laterality: Right;  . Cholecystectomy  01/19/2012  Procedure: LAPAROSCOPIC CHOLECYSTECTOMY WITH INTRAOPERATIVE CHOLANGIOGRAM;  Surgeon: Liz Malady, MD;  Location: [facility] OR;  Service: General;  Laterality: N/A;  laparoscopic cholecystectomy with intraoperative cholangiogram  . Ercp  01/22/2012    Procedure: ENDOSCOPIC RETROGRADE CHOLANGIOPANCREATOGRAPHY (ERCP);  Surgeon: Louis Meckel, MD;  Location: [facility] OR;  Service: Endoscopy;  Laterality: N/A;  with stent placement  . Ercp  01/21/2012    Procedure: ENDOSCOPIC RETROGRADE CHOLANGIOPANCREATOGRAPHY (ERCP);  Surgeon: Rachael Fee, MD;  Location: [facility] OR;  Service: Gastroenterology;  Laterality: N/A;  . Esophagogastroduodenoscopy  01/24/2012    Procedure: ESOPHAGOGASTRODUODENOSCOPY (EGD);  Surgeon: Louis Meckel, MD;  Location: [facility] ENDOSCOPY;  Service: Endoscopy;  Laterality: N/A;  remove pancr stent   .  Endoscopic retrograde cholangiopancreatography (ercp) with propofol N/A 03/07/2012    Procedure: ENDOSCOPIC RETROGRADE CHOLANGIOPANCREATOGRAPHY (ERCP) WITH PROPOFOL;  Surgeon: Rachael Fee, MD;  Location: [facility] ENDOSCOPY;  Service: Endoscopy;  Laterality: N/A;  remove stent    Family History  Problem Relation Age of Onset  . Arthritis Mother   . Heart failure Mother   . Hypertension Mother   . Diabetes Mother   . Heart failure Daughter     deceased age 74  . Other Daughter     deceased age 54 MVA    History   Social History  . Marital Status: Married    Spouse Name: N/A    Number of Children: 6  . Years of Education: N/A   Occupational History  . retired    Social History Main Topics  . Smoking status: Current Every Day Smoker -- 1.00 packs/day for 60 years    Types: Cigarettes  . Smokeless tobacco: Never Used     Comment: pt using electric cig  . Alcohol Use: No  . Drug Use: No  . Sexually Active: Not on file   Other Topics Concern  . Not on file   Social History Narrative  . No narrative on file    Current Outpatient Prescriptions on File Prior to Visit  Medication Sig Dispense Refill  . albuterol (PROVENTIL HFA;VENTOLIN HFA) 108 (90 BASE) MCG/ACT inhaler Inhale 2 puffs into the lungs every 6 (six) hours as needed for wheezing.  1 Inhaler  2  . ALPRAZolam (XANAX) 0.25 MG tablet Take 1 tablet (0.25 mg total) by mouth at bedtime.  30 tablet  1  . metoprolol tartrate (LOPRESSOR) 25 MG tablet Take 12.5 mg by mouth 2 (two) times daily.      . Rivaroxaban (XARELTO) 20 MG TABS Take 20 mg by mouth daily. Patient takes at night      . senna (SENOKOT) 8.6 MG TABS Take 2 tablets by mouth at bedtime.      . sertraline (ZOLOFT) 100 MG tablet Take 100 mg by mouth daily before breakfast.      . SPIRIVA HANDIHALER 18 MCG inhalation capsule INHALE ONE CAPSULE EVERY DAY  30 capsule  2   No current facility-administered medications on file prior to visit.    Allergies  Allergen  Reactions  . Lyrica (Pregabalin) Hives and Itching    Review of Systems  Review of Systems  Constitutional: Positive for malaise/fatigue. Negative for fever.  HENT: Negative for congestion.   Eyes: Negative for discharge.  Respiratory: Positive for shortness of breath.   Cardiovascular: Negative for chest pain, palpitations and leg swelling.  Gastrointestinal: Negative for nausea, abdominal pain and diarrhea.  Genitourinary: Negative for dysuria.  Musculoskeletal: Negative for falls.  Skin: Negative for rash.  Neurological:  Negative for loss of consciousness and headaches.  Endo/Heme/Allergies: Negative for polydipsia.  Psychiatric/Behavioral: Negative for depression and suicidal ideas. The patient is nervous/anxious. The patient does not have insomnia.     Objective  BP 144/76  Pulse 59  Temp(Src) 97.6 F (36.4 C) (Oral)  Ht 5\' 8"  (1.727 m)  Wt 193 lb (87.544 kg)  BMI 29.35 kg/m2  SpO2 91%  Physical Exam  Physical Exam  Constitutional: He is oriented to person, place, and time and well-developed, well-nourished, and in no distress. No distress.  HENT:  Head: Normocephalic and atraumatic.  Eyes: Conjunctivae are normal.  Neck: Neck supple. No thyromegaly present.  Cardiovascular: Normal rate, regular rhythm and normal heart sounds.   Pulmonary/Chest: Effort normal and breath sounds normal. No respiratory distress.  Prolonged expiration  Abdominal: He exhibits no distension and no mass. There is no tenderness.  Musculoskeletal: He exhibits no edema.  Neurological: He is alert and oriented to person, place, and time.  Skin: Skin is warm.  Psychiatric: Memory, affect and judgment normal.    Lab Results  Component Value Date   TSH 1.05 08/01/2011   Lab Results  Component Value Date   WBC 7.2 03/25/2012   HGB 14.2 03/25/2012   HCT 43.8 03/25/2012   MCV 93.4 03/25/2012   PLT 212 03/25/2012   Lab Results  Component Value Date   CREATININE 1.14 03/25/2012   BUN 19  03/25/2012   NA 142 03/25/2012   K 3.5 03/25/2012   CL 104 03/25/2012   CO2 29 03/25/2012   Lab Results  Component Value Date   ALT 12 03/06/2012   AST 12 03/06/2012   ALKPHOS 85 03/06/2012   BILITOT 0.4 03/06/2012   Lab Results  Component Value Date   CHOL 166 09/07/2010   Lab Results  Component Value Date   HDL 29* 09/07/2010   Lab Results  Component Value Date   LDLCALC 116* 09/07/2010   Lab Results  Component Value Date   TRIG 104 09/07/2010   Lab Results  Component Value Date   CHOLHDL 5.7 09/07/2010     Assessment & Plan  Thyroid cancer, papillary Is scheduled for surgery soon for further treatment.  Bradycardia Very mild and asymptomatic  Tobacco abuse Unfortunately still smoking 1 ppd. Patient unwilling to commit to quitting at this visit.  HTN (hypertension), malignant Well controlled today. No changes.   Paroxysmal a-fib RRR today

## 2012-03-31 NOTE — Assessment & Plan Note (Signed)
Well controlled today No changes 

## 2012-03-31 NOTE — Assessment & Plan Note (Signed)
RRR today 

## 2012-03-31 NOTE — Telephone Encounter (Signed)
Change in sig was not intentional he can have the Alprazolam 0,25 mg tabs 1 tab po tid prn anxiety, insomnia, disp #90 1 rf

## 2012-04-01 MED ORDER — ALPRAZOLAM 0.25 MG PO TABS: 0.2500 mg | ORAL_TABLET | Freq: Three times a day (TID) | ORAL | Status: DC | PRN

## 2012-04-01 NOTE — Telephone Encounter (Signed)
RX faxed and medlist updated

## 2012-04-02 NOTE — Progress Notes (Signed)
Left message on phone of [phone] regarding time change to arrive at 0530am to Short Stay and surgery time 0730am-0915am.  Instructed patient to be nothing to eat or drink after midnite due to time change.  Instructed patient to call nurse back to make sure he is aware of instructions.

## 2012-04-04 ENCOUNTER — Ambulatory Visit (HOSPITAL_COMMUNITY): Payer: Medicare Other | Admitting: Anesthesiology

## 2012-04-04 ENCOUNTER — Observation Stay (HOSPITAL_COMMUNITY)
Admission: RE | Admit: 2012-04-04 | Discharge: 2012-04-05 | Disposition: A | Discharge status: Home / Self Care | Payer: Medicare Other | Source: Ambulatory Visit | Attending: Surgery | Admitting: Surgery

## 2012-04-04 ENCOUNTER — Encounter (HOSPITAL_COMMUNITY): Payer: Self-pay | Admitting: Anesthesiology

## 2012-04-04 ENCOUNTER — Encounter (HOSPITAL_COMMUNITY)
Admission: RE | Disposition: A | Discharge status: Home / Self Care | Payer: Self-pay | Source: Ambulatory Visit | Attending: Surgery

## 2012-04-04 ENCOUNTER — Encounter (HOSPITAL_COMMUNITY): Payer: Self-pay | Admitting: *Deleted

## 2012-04-04 DIAGNOSIS — G4730 Sleep apnea, unspecified: Secondary | ICD-10-CM | POA: Insufficient documentation

## 2012-04-04 DIAGNOSIS — I714 Abdominal aortic aneurysm, without rupture, unspecified: Secondary | ICD-10-CM | POA: Insufficient documentation

## 2012-04-04 DIAGNOSIS — I10 Essential (primary) hypertension: Secondary | ICD-10-CM | POA: Insufficient documentation

## 2012-04-04 DIAGNOSIS — J449 Chronic obstructive pulmonary disease, unspecified: Secondary | ICD-10-CM | POA: Insufficient documentation

## 2012-04-04 DIAGNOSIS — J4489 Other specified chronic obstructive pulmonary disease: Secondary | ICD-10-CM | POA: Insufficient documentation

## 2012-04-04 DIAGNOSIS — R32 Unspecified urinary incontinence: Secondary | ICD-10-CM | POA: Insufficient documentation

## 2012-04-04 DIAGNOSIS — Z79899 Other long term (current) drug therapy: Secondary | ICD-10-CM | POA: Insufficient documentation

## 2012-04-04 DIAGNOSIS — I4891 Unspecified atrial fibrillation: Secondary | ICD-10-CM | POA: Insufficient documentation

## 2012-04-04 DIAGNOSIS — K219 Gastro-esophageal reflux disease without esophagitis: Secondary | ICD-10-CM | POA: Insufficient documentation

## 2012-04-04 DIAGNOSIS — C73 Malignant neoplasm of thyroid gland: Secondary | ICD-10-CM

## 2012-04-04 DIAGNOSIS — Z86718 Personal history of other venous thrombosis and embolism: Secondary | ICD-10-CM | POA: Insufficient documentation

## 2012-04-04 HISTORY — PX: THYROIDECTOMY: SHX17

## 2012-04-04 SURGERY — THYROIDECTOMY
Anesthesia: General | Site: Neck | Wound class: Clean

## 2012-04-04 MED ORDER — TIOTROPIUM BROMIDE MONOHYDRATE 18 MCG IN CAPS
18.0000 ug | ORAL_CAPSULE | Freq: Every day | RESPIRATORY_TRACT | Status: DC
  Filled 2012-04-04: qty 5

## 2012-04-04 MED ORDER — HYDROMORPHONE HCL PF 1 MG/ML IJ SOLN
0.2500 mg | INTRAMUSCULAR | Status: DC | PRN
  Administered 2012-04-04 (×3): 0.5 mg via INTRAVENOUS

## 2012-04-04 MED ORDER — GLYCOPYRROLATE 0.2 MG/ML IJ SOLN
INTRAMUSCULAR | Status: DC | PRN
  Administered 2012-04-04: 0.3 mg via INTRAVENOUS
  Administered 2012-04-04: 0.6 mg via INTRAVENOUS

## 2012-04-04 MED ORDER — FENTANYL CITRATE 0.05 MG/ML IJ SOLN
INTRAMUSCULAR | Status: DC | PRN
  Administered 2012-04-04: 25 ug via INTRAVENOUS
  Administered 2012-04-04: 100 ug via INTRAVENOUS
  Administered 2012-04-04: 50 ug via INTRAVENOUS
  Administered 2012-04-04 (×2): 25 ug via INTRAVENOUS

## 2012-04-04 MED ORDER — PROMETHAZINE HCL 25 MG/ML IJ SOLN: 12.5000 mg | INTRAMUSCULAR | Status: DC | PRN

## 2012-04-04 MED ORDER — PROPOFOL 10 MG/ML IV BOLUS
INTRAVENOUS | Status: DC | PRN
  Administered 2012-04-04: 170 mg via INTRAVENOUS

## 2012-04-04 MED ORDER — ALPRAZOLAM 0.25 MG PO TABS: 0.2500 mg | ORAL_TABLET | Freq: Three times a day (TID) | ORAL | Status: DC | PRN

## 2012-04-04 MED ORDER — TIOTROPIUM BROMIDE MONOHYDRATE 18 MCG IN CAPS
18.0000 ug | ORAL_CAPSULE | Freq: Every day | RESPIRATORY_TRACT | Status: DC
  Administered 2012-04-05: 18 ug via RESPIRATORY_TRACT
  Filled 2012-04-04: qty 5

## 2012-04-04 MED ORDER — HYDRALAZINE HCL 20 MG/ML IJ SOLN
5.0000 mg | INTRAMUSCULAR | Status: DC | PRN
  Administered 2012-04-04 (×2): 5 mg via INTRAVENOUS

## 2012-04-04 MED ORDER — 0.9 % SODIUM CHLORIDE (POUR BTL) OPTIME
TOPICAL | Status: DC | PRN
  Administered 2012-04-04: 1000 mL

## 2012-04-04 MED ORDER — ACETAMINOPHEN 325 MG PO TABS: 650.0000 mg | ORAL_TABLET | ORAL | Status: DC | PRN

## 2012-04-04 MED ORDER — DEXAMETHASONE SODIUM PHOSPHATE 10 MG/ML IJ SOLN
INTRAMUSCULAR | Status: DC | PRN
  Administered 2012-04-04: 10 mg via INTRAVENOUS

## 2012-04-04 MED ORDER — OXYCODONE HCL 5 MG PO TABS: 5.0000 mg | ORAL_TABLET | Freq: Once | ORAL | Status: DC | PRN

## 2012-04-04 MED ORDER — CEFAZOLIN SODIUM-DEXTROSE 2-3 GM-% IV SOLR
2.0000 g | INTRAVENOUS | Status: AC
  Administered 2012-04-04: 2 g via INTRAVENOUS

## 2012-04-04 MED ORDER — SUCCINYLCHOLINE CHLORIDE 20 MG/ML IJ SOLN
INTRAMUSCULAR | Status: DC | PRN
  Administered 2012-04-04: 100 mg via INTRAVENOUS

## 2012-04-04 MED ORDER — HYDROMORPHONE HCL PF 1 MG/ML IJ SOLN
INTRAMUSCULAR | Status: AC
  Filled 2012-04-04: qty 1

## 2012-04-04 MED ORDER — CISATRACURIUM BESYLATE (PF) 10 MG/5ML IV SOLN
INTRAVENOUS | Status: DC | PRN
  Administered 2012-04-04: 3 mg via INTRAVENOUS
  Administered 2012-04-04: 5 mg via INTRAVENOUS
  Administered 2012-04-04: 1 mg via INTRAVENOUS

## 2012-04-04 MED ORDER — HYDROCODONE-ACETAMINOPHEN 5-325 MG PO TABS
1.0000 | ORAL_TABLET | ORAL | Status: DC | PRN
  Administered 2012-04-05: 1 via ORAL
  Filled 2012-04-04: qty 1

## 2012-04-04 MED ORDER — PROMETHAZINE HCL 25 MG/ML IJ SOLN: 6.2500 mg | INTRAMUSCULAR | Status: DC | PRN

## 2012-04-04 MED ORDER — KCL IN DEXTROSE-NACL 20-5-0.45 MEQ/L-%-% IV SOLN
INTRAVENOUS | Status: DC
  Administered 2012-04-04: 12:00:00 via INTRAVENOUS
  Filled 2012-04-04 (×2): qty 1000

## 2012-04-04 MED ORDER — CALCIUM CARBONATE 1250 (500 CA) MG PO TABS
1250.0000 mg | ORAL_TABLET | Freq: Three times a day (TID) | ORAL | Status: DC
  Administered 2012-04-04 – 2012-04-05 (×3): 1250 mg via ORAL
  Filled 2012-04-04 (×6): qty 1

## 2012-04-04 MED ORDER — MEPERIDINE HCL 50 MG/ML IJ SOLN: 6.2500 mg | INTRAMUSCULAR | Status: DC | PRN

## 2012-04-04 MED ORDER — OXYCODONE HCL 5 MG/5ML PO SOLN
5.0000 mg | Freq: Once | ORAL | Status: DC | PRN
  Filled 2012-04-04: qty 5

## 2012-04-04 MED ORDER — ACETAMINOPHEN 10 MG/ML IV SOLN
INTRAVENOUS | Status: DC | PRN
  Administered 2012-04-04: 1000 mg via INTRAVENOUS

## 2012-04-04 MED ORDER — ONDANSETRON HCL 4 MG/2ML IJ SOLN
INTRAMUSCULAR | Status: DC | PRN
  Administered 2012-04-04: 4 mg via INTRAVENOUS

## 2012-04-04 MED ORDER — LACTATED RINGERS IV SOLN
INTRAVENOUS | Status: DC | PRN
  Administered 2012-04-04 (×2): via INTRAVENOUS

## 2012-04-04 MED ORDER — HYDROMORPHONE HCL PF 1 MG/ML IJ SOLN
1.0000 mg | INTRAMUSCULAR | Status: DC | PRN
  Administered 2012-04-04 – 2012-04-05 (×4): 1 mg via INTRAVENOUS
  Filled 2012-04-04 (×4): qty 1

## 2012-04-04 MED ORDER — EPHEDRINE SULFATE 50 MG/ML IJ SOLN
INTRAMUSCULAR | Status: DC | PRN
  Administered 2012-04-04: 10 mg via INTRAVENOUS

## 2012-04-04 MED ORDER — ALBUTEROL SULFATE HFA 108 (90 BASE) MCG/ACT IN AERS
2.0000 | INHALATION_SPRAY | Freq: Four times a day (QID) | RESPIRATORY_TRACT | Status: DC | PRN
  Administered 2012-04-05: 2 via RESPIRATORY_TRACT
  Filled 2012-04-04: qty 6.7

## 2012-04-04 MED ORDER — NEOSTIGMINE METHYLSULFATE 1 MG/ML IJ SOLN
INTRAMUSCULAR | Status: DC | PRN
  Administered 2012-04-04: 4 mg via INTRAVENOUS

## 2012-04-04 MED ORDER — ACETAMINOPHEN 10 MG/ML IV SOLN: 1000.0000 mg | Freq: Once | INTRAVENOUS | Status: DC | PRN

## 2012-04-04 MED ORDER — METOPROLOL TARTRATE 12.5 MG HALF TABLET
12.5000 mg | ORAL_TABLET | Freq: Two times a day (BID) | ORAL | Status: DC
  Administered 2012-04-04: 12.5 mg via ORAL
  Filled 2012-04-04 (×3): qty 1

## 2012-04-04 MED ORDER — SERTRALINE HCL 100 MG PO TABS
100.0000 mg | ORAL_TABLET | Freq: Every day | ORAL | Status: DC
  Filled 2012-04-04: qty 1

## 2012-04-04 SURGICAL SUPPLY — 42 items
APL SKNCLS STERI-STRIP NONHPOA (GAUZE/BANDAGES/DRESSINGS) ×1
ATTRACTOMAT 16X20 MAGNETIC DRP (DRAPES) ×2 IMPLANT
BENZOIN TINCTURE PRP APPL 2/3 (GAUZE/BANDAGES/DRESSINGS) ×2 IMPLANT
BLADE HEX COATED 2.75 (ELECTRODE) ×2 IMPLANT
BLADE SURG 15 STRL LF DISP TIS (BLADE) ×1 IMPLANT
BLADE SURG 15 STRL SS (BLADE) ×2
BLADE SURG ROTATE 9660 (MISCELLANEOUS) ×1 IMPLANT
CANISTER SUCTION 2500CC (MISCELLANEOUS) ×1 IMPLANT
CHLORAPREP W/TINT 10.5 ML (MISCELLANEOUS) ×2 IMPLANT
CLIP TI MEDIUM 6 (CLIP) ×6 IMPLANT
CLIP TI WIDE RED SMALL 6 (CLIP) ×7 IMPLANT
CLOTH BEACON ORANGE TIMEOUT ST (SAFETY) ×2 IMPLANT
CLSR STERI-STRIP ANTIMIC 1/2X4 (GAUZE/BANDAGES/DRESSINGS) ×1 IMPLANT
DISSECTOR ROUND CHERRY 3/8 STR (MISCELLANEOUS) ×1 IMPLANT
DRAPE PED LAPAROTOMY (DRAPES) ×2 IMPLANT
DRESSING SURGICEL FIBRLLR 1X2 (HEMOSTASIS) ×1 IMPLANT
DRSG SURGICEL FIBRILLAR 1X2 (HEMOSTASIS) ×2
ELECT REM PT RETURN 9FT ADLT (ELECTROSURGICAL) ×2
ELECTRODE REM PT RTRN 9FT ADLT (ELECTROSURGICAL) ×1 IMPLANT
GAUZE SPONGE 4X4 16PLY XRAY LF (GAUZE/BANDAGES/DRESSINGS) ×3 IMPLANT
GLOVE SURG ORTHO 8.0 STRL STRW (GLOVE) ×2 IMPLANT
GOWN STRL NON-REIN LRG LVL3 (GOWN DISPOSABLE) ×2 IMPLANT
GOWN STRL REIN XL XLG (GOWN DISPOSABLE) ×4 IMPLANT
KIT BASIN OR (CUSTOM PROCEDURE TRAY) ×2 IMPLANT
MANIFOLD NEPTUNE II (INSTRUMENTS) ×1 IMPLANT
NS IRRIG 1000ML POUR BTL (IV SOLUTION) ×1 IMPLANT
PACK BASIC VI WITH GOWN DISP (CUSTOM PROCEDURE TRAY) ×2 IMPLANT
PENCIL BUTTON HOLSTER BLD 10FT (ELECTRODE) ×2 IMPLANT
SHEARS HARMONIC 9CM CVD (BLADE) ×2 IMPLANT
SPONGE GAUZE 4X4 12PLY (GAUZE/BANDAGES/DRESSINGS) ×1 IMPLANT
STAPLER VISISTAT 35W (STAPLE) ×2 IMPLANT
STRIP CLOSURE SKIN 1/2X4 (GAUZE/BANDAGES/DRESSINGS) ×2 IMPLANT
SUT MNCRL AB 4-0 PS2 18 (SUTURE) ×2 IMPLANT
SUT SILK 2 0 (SUTURE) ×2
SUT SILK 2-0 18XBRD TIE 12 (SUTURE) ×1 IMPLANT
SUT SILK 3 0 (SUTURE)
SUT SILK 3-0 18XBRD TIE 12 (SUTURE) IMPLANT
SUT VIC AB 3-0 SH 18 (SUTURE) ×3 IMPLANT
SYR BULB IRRIGATION 50ML (SYRINGE) ×2 IMPLANT
TAPE CLOTH SURG 4X10 WHT LF (GAUZE/BANDAGES/DRESSINGS) ×1 IMPLANT
TOWEL OR 17X26 10 PK STRL BLUE (TOWEL DISPOSABLE) ×2 IMPLANT
YANKAUER SUCT BULB TIP 10FT TU (MISCELLANEOUS) ×2 IMPLANT

## 2012-04-04 NOTE — Interval H&P Note (Signed)
History and Physical Interval Note:  04/04/2012 7:15 AM  Steve [name]  has presented today for surgery, with the diagnosis of PAPILLARY THYROID CANCER.   The various methods of treatment have been discussed with the patient and family. After consideration of risks, benefits and other options for treatment, the patient has consented to    Procedure(s):  TOTAL THYROIDECTOMY WITH LIMITED LYMPH NODE DISSECTION (N/A) as a surgical intervention .    The patient's history has been reviewed, patient examined, no change in status, stable for surgery.  I have reviewed the patient's chart and labs.  Questions were answered to the patient's satisfaction.    Velora Heckler, MD, FACS General & Endocrine Surgery [facility] Office: [phone]  Reginald Weida Judie Petit

## 2012-04-04 NOTE — Transfer of Care (Signed)
Immediate Anesthesia Transfer of Care Note  Patient: Steve [name]  Procedure(s) Performed: Procedure(s):  TOTAL THYROIDECTOMY WITH CENTRAL COMPARTMENT LYMPH NODE DISSECTION (N/A)  Patient Location: PACU  Anesthesia Type:General  Level of Consciousness: awake, sedated and patient cooperative  Airway & Oxygen Therapy: Patient Spontanous Breathing and Patient connected to face mask oxygen  Post-op Assessment: Report given to PACU RN and Post -op Vital signs reviewed and stable  Post vital signs: Reviewed and stable  Complications: No apparent anesthesia complications

## 2012-04-04 NOTE — Anesthesia Preprocedure Evaluation (Addendum)
Anesthesia Evaluation  Patient identified by MRN, date of birth, ID band Patient awake    Reviewed: Allergy & Precautions, H&P , NPO status , Patient's Chart, lab work & pertinent test results  Airway Mallampati: II TM Distance: >3 FB Neck ROM: Full    Dental no notable dental hx. (+) Dental Advisory Given   Pulmonary shortness of breath and with exertion, sleep apnea , pneumonia -, resolved, COPD COPD inhaler,  breath sounds clear to auscultation  Pulmonary exam normal       Cardiovascular hypertension, Pt. on home beta blockers and Pt. on medications + Peripheral Vascular Disease + dysrhythmias Rhythm:Regular Rate:Normal     Neuro/Psych PSYCHIATRIC DISORDERS Anxiety Depression negative neurological ROS     GI/Hepatic Neg liver ROS, hiatal hernia, GERD-  Medicated,  Endo/Other  negative endocrine ROS  Renal/GU negative Renal ROS  negative genitourinary   Musculoskeletal negative musculoskeletal ROS (+)   Abdominal   Peds negative pediatric ROS (+)  Hematology negative hematology ROS (+)   Anesthesia Other Findings   Reproductive/Obstetrics                           Anesthesia Physical  Anesthesia Plan  ASA: III  Anesthesia Plan: General   Post-op Pain Management:    Induction: Intravenous  Airway Management Planned: Oral ETT  Additional Equipment:   Intra-op Plan:   Post-operative Plan: Extubation in OR  Informed Consent: I have reviewed the patients History and Physical, chart, labs and discussed the procedure including the risks, benefits and alternatives for the proposed anesthesia with the patient or authorized representative who has indicated his/her understanding and acceptance.   Dental advisory given  Plan Discussed with: CRNA  Anesthesia Plan Comments:         Anesthesia Quick Evaluation

## 2012-04-04 NOTE — Anesthesia Postprocedure Evaluation (Signed)
Anesthesia Post Note  Patient: Steve [name]  Procedure(s) Performed: Procedure(s) (LRB):  TOTAL THYROIDECTOMY WITH CENTRAL COMPARTMENT LYMPH NODE DISSECTION (N/A)  Anesthesia type: General  Patient location: PACU  Post pain: Pain level controlled  Post assessment: Post-op Vital signs reviewed  Last Vitals: BP 156/71  Pulse 73  Temp(Src) 36.4 C (Oral)  Resp 14  SpO2 91%  Post vital signs: Reviewed  Level of consciousness: sedated  Complications: No apparent anesthesia complications

## 2012-04-04 NOTE — Progress Notes (Signed)
Blood pressure elevated; anesthesiologist Dr. Renold Don aware, order rec'd and med given

## 2012-04-04 NOTE — Brief Op Note (Signed)
04/04/2012  9:36 AM  PATIENT:  Riki Altes  79 y.o. male  PRE-OPERATIVE DIAGNOSIS:  PAPILLARY THYROID CANCER   POST-OPERATIVE DIAGNOSIS:  PAPILLARY THYROID CANCER  PROCEDURE:  Procedure(s):  TOTAL THYROIDECTOMY WITH CENTRAL COMPARTMENT LYMPH NODE DISSECTION (N/A)  SURGEON:  Surgeon(s) and Role:    * Velora Heckler, MD - Primary  ANESTHESIA:   general  EBL:  Total I/O In: 1000 [I.V.:1000] Out: -   BLOOD ADMINISTERED:none  DRAINS: none   LOCAL MEDICATIONS USED:  NONE  SPECIMEN:  Excision  DISPOSITION OF SPECIMEN:  PATHOLOGY  COUNTS:  YES  TOURNIQUET:  * No tourniquets in log *  DICTATION: .Other Dictation: Dictation Number [id number]  PLAN OF CARE: Admit for overnight observation  PATIENT DISPOSITION:  PACU - hemodynamically stable.   Delay start of Pharmacological VTE agent (>24hrs) due to surgical blood loss or risk of bleeding: yes  Velora Heckler, MD, [facility] Office: [phone]

## 2012-04-04 NOTE — H&P (View-Only) (Signed)
General Surgery [facility]  Chief Complaint  Patient presents with  . New Evaluation    eval thyroid cancer - referral from Dr. Arlan Organ    HISTORY: Patient is a 79 year old white male well known to our surgical practice. Patient is referred by his medical oncologist for newly diagnosed papillary thyroid carcinoma. Patient had undergone a followup PET scan for his previously diagnosed with mesothelioma. This showed hypermetabolic activity in the left thyroid lobe. Subsequent ultrasound showed a dominant 19 mm nodule. Fine needle aspiration biopsy confirmed papillary thyroid carcinoma. Patient is now referred for total thyroidectomy for management.  Patient has no prior history of thyroid disease. He has never been on thyroid medication. He has had no prior head or neck surgery. There is no family history of thyroid cancer. There is no history of other endocrinopathy such as pituitary tumors or adrenal tumors.  Past Medical History  Diagnosis Date  . Prostate enlargement   . GERD (gastroesophageal reflux disease)   . AAA (abdominal aortic aneurysm)     stress test 09/14/10 EPIC  . Anxiety   . COPD (chronic obstructive pulmonary disease)   . Shortness of breath     with exertion   . Recurrent upper respiratory infection (URI)     productive cough- white phlegm- no fever  . Hypertension     chest x ray 12/12 EPIC- repeated 06/06/11, EKG 11/12 EPIC  . DVT (deep venous thrombosis) 08/2011  . Pneumonia   . Bruises easily     takes Xarelto  . Depression   . HTN (hypertension)   . Dysrhythmia     hx of atrial fib post op x 2 days   . Atrial fibrillation   . Sleep apnea      12/12 sleep study,SEVERE per study  Dr Delton Coombes- states doesnt wear machine on regular basis- setting BiPAP 9/9  . Sleep apnea     o2 at night 2 liters  . Mesothelioma   . Arthritis     knee and back     Current Outpatient Prescriptions  Medication Sig Dispense Refill  . albuterol  (PROVENTIL HFA;VENTOLIN HFA) 108 (90 BASE) MCG/ACT inhaler Inhale 2 puffs into the lungs every 6 (six) hours as needed for wheezing.  1 Inhaler  2  . ALPRAZolam (XANAX) 0.25 MG tablet Take 1 tablet (0.25 mg total) by mouth at bedtime.  30 tablet  1  . metoprolol tartrate (LOPRESSOR) 25 MG tablet Take 12.5 mg by mouth 2 (two) times daily.      . Rivaroxaban (XARELTO) 20 MG TABS Take 1 tablet (20 mg total) by mouth every morning.  30 tablet  3  . senna (SENOKOT) 8.6 MG TABS Take 2 tablets by mouth at bedtime.      . sertraline (ZOLOFT) 100 MG tablet Take 100 mg by mouth daily before breakfast.      . SPIRIVA HANDIHALER 18 MCG inhalation capsule INHALE ONE CAPSULE EVERY DAY  30 capsule  2   No current facility-administered medications for this visit.     Allergies  Allergen Reactions  . Lyrica (Pregabalin) Hives and Itching     Family History  Problem Relation Age of Onset  . Arthritis Mother   . Heart failure Mother   . Hypertension Mother   . Diabetes Mother   . Heart failure Daughter     deceased age 82  . Other Daughter     deceased age 54 MVA     History  Social History  . Marital Status: Married    Spouse Name: N/A    Number of Children: 6  . Years of Education: N/A   Occupational History  . retired    Social History Main Topics  . Smoking status: Former Smoker -- 0.50 packs/day for 60 years    Types: Cigarettes    Quit date: 01/10/2012  . Smokeless tobacco: Never Used     Comment: pt using electric cig  . Alcohol Use: No  . Drug Use: No  . Sexually Active: None   Other Topics Concern  . None   Social History Narrative  . None     REVIEW OF SYSTEMS - PERTINENT POSITIVES ONLY: Denies tremors. Denies palpitations. Denies compressive symptoms. Denies voice changes.  EXAM: Filed Vitals:   03/20/12 0909  BP: 162/80  Pulse: 60  Temp: 97.5 F (36.4 C)  Resp: 24    HEENT: normocephalic; pupils equal and reactive; sclerae clear; dentition good;  mucous membranes moist NECK:  Palpable firm mass in the left thyroid lobe, mobile with swallowing, nontender; symmetric on extension; no palpable anterior or posterior cervical lymphadenopathy; no supraclavicular masses; no tenderness CHEST: clear to auscultation bilaterally without rales, rhonchi, or wheezes CARDIAC: irregular rate and rhythm without significant murmur; peripheral pulses are full EXT:  non-tender without edema; no deformity NEURO: no gross focal deficits; no sign of tremor   LABORATORY RESULTS: See [facility] (CHL-Epic) for most recent results   RADIOLOGY RESULTS: See [facility] (CHL-Epic) for most recent results   IMPRESSION: Papillary thyroid carcinoma, left thyroid lobe, 19 mm  PLAN: I discussed at length with the patient and his wife the indications for surgery. We discussed total thyroidectomy with limited lymph node dissection. We discussed potential complications including recurrent nerve injury and injury to parathyroid glands. I explained to them that his incidence of recurrent nerve injury may be as high as 5% given the location of his malignancy. We discussed the postoperative course to be anticipated and the need for treatment with radioactive iodine. They understand and wish to proceed in the near future.  The risks and benefits of the procedure have been discussed at length with the patient.  The patient understands the proposed procedure, potential alternative treatments, and the course of recovery to be expected.  All of the patient's questions have been answered at this time.  The patient wishes to proceed with surgery.  Velora Heckler, MD, FACS General & Endocrine Surgery [facility]   Visit Diagnoses: 1. Thyroid cancer, papillary     Primary Care Physician: Letitia Libra, Ala Dach, MD

## 2012-04-05 ENCOUNTER — Encounter (HOSPITAL_COMMUNITY): Payer: Self-pay | Admitting: Surgery

## 2012-04-05 ENCOUNTER — Telehealth (INDEPENDENT_AMBULATORY_CARE_PROVIDER_SITE_OTHER): Payer: Self-pay

## 2012-04-05 ENCOUNTER — Other Ambulatory Visit (INDEPENDENT_AMBULATORY_CARE_PROVIDER_SITE_OTHER): Payer: Self-pay

## 2012-04-05 DIAGNOSIS — C73 Malignant neoplasm of thyroid gland: Secondary | ICD-10-CM

## 2012-04-05 LAB — BASIC METABOLIC PANEL
BUN: 20 mg/dL (ref 6–23)
Chloride: 101 mEq/L (ref 96–112)
Creatinine, Ser: 0.91 mg/dL (ref 0.50–1.35)
GFR calc non Af Amer: 80 mL/min — ABNORMAL LOW (ref >90)
Glucose, Bld: 93 mg/dL (ref 70–99)
Potassium: 3.7 mEq/L (ref 3.5–5.1)

## 2012-04-05 MED ORDER — HYDROCODONE-ACETAMINOPHEN 5-325 MG PO TABS: 1.0000 | ORAL_TABLET | ORAL | Status: DC | PRN

## 2012-04-05 MED ORDER — CALCIUM CARBONATE 1250 (500 CA) MG PO TABS: 2.0000 | ORAL_TABLET | Freq: Two times a day (BID) | ORAL | Status: DC

## 2012-04-05 MED ORDER — SYNTHROID 100 MCG PO TABS: 100.0000 ug | ORAL_TABLET | Freq: Every day | ORAL | Status: DC

## 2012-04-05 NOTE — Care Management Note (Signed)
    Page 1 of 1   04/05/2012     10:24:39 AM   CARE MANAGEMENT NOTE 04/05/2012  Patient:  Steve [name], Steve [name]   Account Number:  [id number]  Date Initiated:  04/05/2012  Documentation initiated by:  Lorenda Ishihara  Subjective/Objective Assessment:   79 yo male admitted s/p thyroidectomy. PTA lived at home with wife.     Action/Plan:   Home when stable   Anticipated DC Date:  04/05/2012   Anticipated DC Plan:  HOME/SELF CARE      DC Planning Services  CM consult      Choice offered to / List presented to:             Status of service:  Completed, signed off Medicare Important Message given?   (If response is "NO", the following Medicare IM given date fields will be blank) Date Medicare IM given:   Date Additional Medicare IM given:    Discharge Disposition:  HOME/SELF CARE  Per UR Regulation:  Reviewed for med. necessity/level of care/duration of stay  If discussed at Long Length of Stay Meetings, dates discussed:    Comments:

## 2012-04-05 NOTE — Telephone Encounter (Signed)
Message copied by Joanette Gula on Fri Apr 05, 2012  9:58 AM ------      Message from: Velora Heckler      Created: Fri Apr 05, 2012  7:25 AM       Gerri Spore  3/28            Discharged.            Schedule OV in 2-3 weeks.  Calcium level before OV.            Schedule new consult with Dr. Debara Pickett for papillary thyroid cancer.            Thanks,            tmg            Velora Heckler, MD, [facility]      Office: [phone]             ------

## 2012-04-05 NOTE — Op Note (Signed)
NAMEMarland [name]  CRANDALL, HARVEL NO.:  [id number]  MEDICAL RECORD NO.:  [id number]  LOCATION:  1528                         FACILITY:  [facility]  PHYSICIAN:  Steve Heckler, MD      DATE OF BIRTH:  22-Jun-1933  DATE OF PROCEDURE:  04/04/2012                               OPERATIVE REPORT   PREOPERATIVE DIAGNOSIS:  Papillary thyroid carcinoma.  POSTOPERATIVE DIAGNOSIS:  Papillary thyroid carcinoma.  PROCEDURES: 1. Total thyroidectomy. 2. Limited central compartment lymph node dissection (zone VI).  SURGEON:  Steve Heckler, MD, FACS  ANESTHESIA:  General.  ESTIMATED BLOOD LOSS:  Minimal.  PREPARATION:  ChloraPrep.  COMPLICATIONS:  None.  INDICATIONS:  The patient is a 79 year old white male, followed by Dr. Arlan Organ for mesothelioma.  The patient underwent PET scan, which showed activity in the left lobe of the thyroid.  Subsequent fine-needle aspiration biopsy was obtained.  This confirmed papillary thyroid carcinoma.  The patient now comes to Surgery for total thyroidectomy and limited lymph node dissection.  BODY OF REPORT:  Procedure was done in OR #7 at the [facility].  The patient was brought to the operating room, placed in supine position on the operating room table.  Following administration of general anesthesia, the patient was positioned and then prepped and draped in the usual strict aseptic fashion.  After ascertaining that an adequate level of anesthesia had been achieved, a Kocher incision was made with a #15 blade.  Dissection was carried down through subcutaneous tissues and platysma.  Hemostasis was obtained with electrocautery.  Skin flaps were elevated cephalad and caudad from the thyroid notch to the sternal notch.  The Mahorner self-retaining retractor was placed for exposure.  Strap muscles were incised in the midline.  Dissection was begun on the left side.  Left thyroid lobe was gently mobilized.  Venous tributaries  were divided between Ligaclips with the Harmonic scalpel.  Superior pole vessels were dissected out and divided individually between medium Ligaclips with the Harmonic scalpel.  Gland was rolled anteriorly.  Inferior thyroid artery was identified and divided between Ligaclips.  On the surface of the thyroid gland, there appears to be a normal parathyroid gland. Unfortunately, its vascular supply comes from well up on the thyroid capsule.  Therefore, the gland was excised.  It is cut into multiple fragments with a #15 blade.  A muscular pocket was created in the left sternocleidomastoid muscle, and the gland was reimplanted into the sternocleidomastoid muscle and secured with a 3-0 Vicryl figure-of-eight suture.  Continuing the dissection of the left thyroid lobe, the inferior venous tributaries were divided between Ligaclips with the Harmonic scalpel. The tumor is located posteriorly in the lobe adjacent to the trachea and very close to the recurrent laryngeal nerve.  The tumor actually extends down to the ligament of Berry.  With gentle dissection, the branches of the inferior thyroid artery were divided between small Ligaclips.  The ligament of Allyson Sabal was released with the electrocautery and the tumor was mobilized off of the trachea and away from the recurrent nerve.  No gross tumor was left in situ.  Isthmus was mobilized across the midline. A small pyramidal lobe  was dissected out and resected en bloc with the isthmus.  Dry pack was placed in the left neck.  Next, we turned our attention to the right thyroid lobe.  Again strap muscles were reflected laterally.  Right lobe was exposed.  It appears grossly normal.  Middle thyroid vein was divided between medium Ligaclips with the Harmonic scalpel.  Superior pole vessels were divided individually between Ligaclips with the Harmonic scalpel.  Gland was rolled anteriorly.  Recurrent nerve was identified and preserved. Parathyroid tissue  was identified and preserved.  Inferior venous tributaries were divided between medium Ligaclips with the Harmonic scalpel.  Branches of the inferior thyroid artery were divided between small Ligaclips with the Harmonic scalpel.  Ligament of Allyson Sabal was released with the electrocautery and the gland was mobilized onto the anterior trachea from which it was completely excised.  Suture was used to mark the left superior pole.  The entire thyroid gland was submitted to Pathology for review.  Next, the tissue overlying the trachea between the carotid arteries was dissected out using the electrocautery for hemostasis.  There was no gross lymphadenopathy.  This tissue was submitted separately, labeled central compartment lymph nodes for evaluation.  The neck was irrigated with warm saline.  Good hemostasis was achieved throughout.  Fibrillar was placed throughout the operative field.  Strap muscles were reapproximated in the midline with interrupted 3-0 Vicryl sutures.  Platysma was closed with interrupted 3-0 Vicryl sutures.  Skin was closed with a running 4-0 Monocryl subcuticular suture.  Wound was washed and dried, and benzoin and Steri-Strips were applied.  Sterile dressings were applied.  The patient was awakened from anesthesia and brought to the recovery room.  The patient tolerated the procedure well.   Steve Heckler, MD, FACS General & Endocrine Surgery [facility] Office: [phone]   TMG/MEDQ  D:  04/04/2012  T:  04/05/2012  Job:  [id number]  cc:   Josph Macho, M.D. Fax: [phone]  Charlynn Court, MD

## 2012-04-05 NOTE — Telephone Encounter (Signed)
Pt home doing well. Advised lab slip mailed to pt for [facility] and records faxed to Dr Sharl Ma for appt to discuss po treatment.

## 2012-04-05 NOTE — Progress Notes (Signed)
Patient discharged via wheelchair. Ambulated in room without difficulty. O2 sat 94% on RA. Dry gauze dressing applied to incision. Rx for norco given. Patient and wife states understanding of discharge instructions.

## 2012-04-05 NOTE — Discharge Summary (Signed)
Physician Discharge Summary [facility]  Patient ID: Steve [name] MRN: [id number] DOB/AGE: [birth year] 77 y.o.  Admit date: 04/04/2012 Discharge date: 04/05/2012  Admission Diagnoses:  Papillary thyroid cancer  Discharge Diagnoses:  Principal Problem:   Thyroid cancer, papillary   Discharged Condition: good  Hospital Course: patient admitted for observation after total thyroidectomy.  Post op course uncomplicated.  Mild pain.  Tolerated diet.  Some urinary incontinence, now resolved.  Calcium level 9.4 this morning.  Consults: None  Significant Diagnostic Studies: labs: calcium  Treatments: surgery: total thyroidectomy  Discharge Exam: Blood pressure 152/63, pulse 61, temperature 97.4 F (36.3 C), temperature source Oral, resp. rate 18, height 5\' 8"  (1.727 m), weight 198 lb 6.6 oz (90 kg), SpO2 98.00%. HEENT - clear Neck - wound clear and dry, minimal STS; voice normal Chest - clear Cor - RRR  Disposition: Home with family  Discharge Orders   Future Appointments Provider Department Dept Phone   04/24/2012 11:15 AM Velora Heckler, MD [facility] [place] [phone]   05/03/2012 2:30 PM Rachael Fee [facility] [phone]   05/03/2012 3:00 PM Josph Macho, MD [facility] [phone]   05/13/2012 1:15 PM Bradd Canary, MD [facility] [phone]   Future Orders Complete By Expires     Apply dressing  As directed     Scheduling Instructions:      Apply light dry gauze dressing to wound before discharge today.    Diet - low sodium heart healthy  As directed     Discharge instructions  As directed     Comments:      THYROID & PARATHYROID SURGERY - POST OP INSTRUCTIONS  Always review your discharge instruction sheet from the facility where your surgery was performed.  A prescription for pain medication may be given to you upon discharge.  Take your pain  medication as prescribed.  If narcotic pain medicine is not needed, then you may take acetaminophen (Tylenol) or ibuprofen (Advil) as needed.  Take your usually prescribed medications unless otherwise directed.  If you need a refill on your pain medication, please contact your pharmacy. They will contact our office to request authorization.  Prescriptions will not be processed after 5 pm or on weekends.  Start with a light diet upon arrival home, such as soup and crackers or toast.  Be sure to drink plenty of fluids daily.  Resume your normal diet the day after surgery.  Most patients will experience some swelling and bruising on the chest and neck area.  Ice packs will help.  Swelling and bruising can take several days to resolve.   It is common to experience some constipation if taking pain medication after surgery.  Increasing fluid intake and taking a stool softener will usually help or prevent this problem.  A mild laxative (Milk of Magnesia or Miralax) should be taken according to package directions if there are no bowel movements after 48 hours.  You may remove your bandages 24-48 hours after surgery, and you may shower at that time.  You have steri-strips (small skin tapes) in place directly over the incision.  These strips should be left on the skin for 7-10 days and then removed.  You may resume regular (light) daily activities beginning the next day-such as daily self-care, walking, climbing stairs-gradually increasing activities as tolerated.  You may have sexual intercourse when it is comfortable.  Refrain from any heavy lifting or straining until approved by your doctor.  You may drive when you no longer are taking prescription pain medication, you can comfortably wear a seatbelt, and you can safely maneuver your car and apply brakes.  You should see your doctor in the office for a follow-up appointment approximately two to three weeks after your surgery.  Make sure that you call for  this appointment within a day or two after you arrive home to insure a convenient appointment time.  WHEN TO CALL YOUR DOCTOR: -- Fever greater than 101.5 -- Inability to urinate -- Nausea and/or vomiting - persistent -- Extreme swelling or bruising -- Continued bleeding from incision -- Increased pain, redness, or drainage from the incision -- Difficulty swallowing or breathing -- Muscle cramping or spasms -- Numbness or tingling in hands or around lips  The clinic staff is available to answer your questions during regular business hours.  Please don't hesitate to call and ask to speak to one of the nurses if you have concerns.  Velora Heckler, MD, FACS General & Endocrine Surgery [facility] Office: [phone]    Increase activity slowly  As directed     Remove dressing in 24 hours  As directed         Medication List    TAKE these medications       albuterol 108 (90 BASE) MCG/ACT inhaler  Commonly known as:  PROVENTIL HFA;VENTOLIN HFA  Inhale 2 puffs into the lungs every 6 (six) hours as needed for wheezing.     ALPRAZolam 0.25 MG tablet  Commonly known as:  XANAX  Take 1 tablet (0.25 mg total) by mouth 3 (three) times daily as needed for sleep.     calcium carbonate 1250 MG tablet  Commonly known as:  OS-CAL - dosed in mg of elemental calcium  Take 2 tablets (1,000 mg of elemental calcium total) by mouth 2 (two) times daily.     HYDROcodone-acetaminophen 5-325 MG per tablet  Commonly known as:  NORCO/VICODIN  Take 1-2 tablets by mouth every 4 (four) hours as needed for pain.     metoprolol tartrate 25 MG tablet  Commonly known as:  LOPRESSOR  Take 12.5 mg by mouth 2 (two) times daily.     Rivaroxaban 20 MG Tabs  Commonly known as:  XARELTO  Take 20 mg by mouth daily. Patient takes at night     senna 8.6 MG Tabs  Commonly known as:  SENOKOT  Take 2 tablets by mouth at bedtime.     sertraline 100 MG tablet  Commonly known as:  ZOLOFT   Take 100 mg by mouth daily before breakfast.     SPIRIVA HANDIHALER 18 MCG inhalation capsule  Generic drug:  tiotropium  INHALE ONE CAPSULE EVERY DAY     SYNTHROID 100 MCG tablet  Generic drug:  levothyroxine  Take 1 tablet (100 mcg total) by mouth daily.         Velora Heckler, MD, [facility] Office: [phone]   Signed: Velora Heckler 04/05/2012, 7:23 AM

## 2012-04-08 ENCOUNTER — Other Ambulatory Visit: Payer: Self-pay | Admitting: *Deleted

## 2012-04-08 DIAGNOSIS — I350 Nonrheumatic aortic (valve) stenosis: Secondary | ICD-10-CM

## 2012-04-08 DIAGNOSIS — I480 Paroxysmal atrial fibrillation: Secondary | ICD-10-CM

## 2012-04-08 DIAGNOSIS — Z86718 Personal history of other venous thrombosis and embolism: Secondary | ICD-10-CM

## 2012-04-08 MED ORDER — RIVAROXABAN 20 MG PO TABS: 20.0000 mg | ORAL_TABLET | Freq: Every day | ORAL | Status: DC

## 2012-04-08 NOTE — Telephone Encounter (Signed)
Received call from pt's dgtr stating that [facility] has faxed over a rx request 4 times for Xarelto and have not received a response. Reassured the dgtr that a fax has not been received. Refilled his rx after reviewing his D/C summary via e-rx.

## 2012-04-11 ENCOUNTER — Telehealth (INDEPENDENT_AMBULATORY_CARE_PROVIDER_SITE_OTHER): Payer: Self-pay

## 2012-04-11 NOTE — Telephone Encounter (Signed)
Message copied by Joanette Gula on Thu Apr 11, 2012  4:01 PM ------      Message from: Velora Heckler      Created: Thu Apr 11, 2012  2:28 PM       Arline Asp:            Let's schedule a new patient consult for Mr. Hawe with Dr. Sharl Ma for thyroid cancer.            Thanks,            tmg            Velora Heckler, MD, [facility]      Office: [phone]                  ----- Message -----         From: Josph Macho, MD         Sent: 04/10/2012   6:22 PM           To: Velora Heckler, MD            Tawanna Cooler:            Thanks for all your help on Mr. Vanecek.  I will defer to your expertise in referral for thyroid cancer. I like Debara Pickett a lot so I think Mr. Haen to do well seen him.            Again, thanks for your incredibly prompt attention to Mr. Steele. He is a real nice guy with a lot of health problems. Hopefully this is one less problem for him to deal with.            Again, Debara Pickett would definitely be okay by me.            Hope all is going well with you. Unfortunately, our cancer group will be doing with more problems in the future and who knows "where the chips may fall".            Pete       ------

## 2012-04-11 NOTE — Telephone Encounter (Signed)
LMOM for Sharion at Dr Daune Perch office that records and ref sheet faxed to their office and confirmation received. I requested call back for appt date.

## 2012-04-22 ENCOUNTER — Other Ambulatory Visit: Payer: Self-pay | Admitting: Internal Medicine

## 2012-04-22 DIAGNOSIS — C73 Malignant neoplasm of thyroid gland: Secondary | ICD-10-CM | POA: Insufficient documentation

## 2012-04-24 ENCOUNTER — Ambulatory Visit (INDEPENDENT_AMBULATORY_CARE_PROVIDER_SITE_OTHER): Payer: Medicare Other | Admitting: Surgery

## 2012-04-24 ENCOUNTER — Encounter (INDEPENDENT_AMBULATORY_CARE_PROVIDER_SITE_OTHER): Payer: Self-pay | Admitting: Surgery

## 2012-04-24 VITALS — BP 126/68 | HR 67 | Temp 97.1°F | Ht 68.0 in | Wt 192.6 lb

## 2012-04-24 DIAGNOSIS — C73 Malignant neoplasm of thyroid gland: Secondary | ICD-10-CM

## 2012-04-24 NOTE — Patient Instructions (Signed)
  COCOA BUTTER & VITAMIN E CREAM  (Palmer's or other brand)  Apply cocoa butter/vitamin E cream to your incision 2 - 3 times daily.  Massage cream into incision for one minute with each application.  Use sunscreen (50 SPF or higher) for first 6 months after surgery if area is exposed to sun.  You may substitute Mederma or other scar reducing creams as desired.   

## 2012-04-24 NOTE — Progress Notes (Signed)
General Surgery [facility]  Visit Diagnoses: 1. Thyroid cancer, papillary     HISTORY: Patient returns for her first postoperative visit having undergone total thyroidectomy for papillary thyroid carcinoma. Patient was found to have a multifocal papillary carcinoma involving both the right and left thyroid lobes. Two central compartment lymph nodes were negative for metastatic disease. Postoperative course has been largely uneventful.  EXAM: Surgical incision is healing nicely. No signs or symptoms of infection. Minimal soft tissue swelling. No sign of seroma. Voice is relatively strong although not quite back to normal levels.  IMPRESSION: Papillary thyroid carcinoma, multifocal  PLAN: Patient has been seen by Dr. Debara Pickett in endocrinology. He is scheduled to undergo radioactive iodine treatment in the near future. He continues on Synthroid 100 mcg daily for now.  Patient will return for surgical follow-up in 6 weeks.  Velora Heckler, MD, FACS General & Endocrine Surgery [facility]

## 2012-05-03 ENCOUNTER — Ambulatory Visit (HOSPITAL_BASED_OUTPATIENT_CLINIC_OR_DEPARTMENT_OTHER): Payer: Medicare Other | Admitting: Hematology & Oncology

## 2012-05-03 ENCOUNTER — Ambulatory Visit (HOSPITAL_BASED_OUTPATIENT_CLINIC_OR_DEPARTMENT_OTHER): Payer: Medicare Other | Admitting: Lab

## 2012-05-03 VITALS — BP 133/65 | HR 52 | Temp 97.3°F | Resp 16 | Ht 68.0 in | Wt 193.0 lb

## 2012-05-03 DIAGNOSIS — I82409 Acute embolism and thrombosis of unspecified deep veins of unspecified lower extremity: Secondary | ICD-10-CM

## 2012-05-03 DIAGNOSIS — Z86718 Personal history of other venous thrombosis and embolism: Secondary | ICD-10-CM

## 2012-05-03 DIAGNOSIS — C73 Malignant neoplasm of thyroid gland: Secondary | ICD-10-CM

## 2012-05-03 DIAGNOSIS — D6859 Other primary thrombophilia: Secondary | ICD-10-CM

## 2012-05-03 DIAGNOSIS — C450 Mesothelioma of pleura: Secondary | ICD-10-CM

## 2012-05-03 DIAGNOSIS — C3490 Malignant neoplasm of unspecified part of unspecified bronchus or lung: Secondary | ICD-10-CM

## 2012-05-03 DIAGNOSIS — I82402 Acute embolism and thrombosis of unspecified deep veins of left lower extremity: Secondary | ICD-10-CM

## 2012-05-03 LAB — CBC WITH DIFFERENTIAL (CANCER CENTER ONLY)
BASO#: 0 10*3/uL (ref 0.0–0.2)
EOS%: 3.1 % (ref 0.0–7.0)
HCT: 43.9 % (ref 38.7–49.9)
HGB: 14.5 g/dL (ref 13.0–17.1)
MCH: 31 pg (ref 28.0–33.4)
MCHC: 33 g/dL (ref 32.0–35.9)
MONO%: 5.7 % (ref 0.0–13.0)
NEUT%: 66.8 % (ref 40.0–80.0)

## 2012-05-03 NOTE — Progress Notes (Signed)
This office note has been dictated.

## 2012-05-04 NOTE — Progress Notes (Signed)
CC:   Marguarite Arbour, MD Gabrielle Dare. [name], M.D. Velora Heckler, MD Salvatore Decent Dorris Fetch, M.D.  DIAGNOSES: 1. Probably carcinoma of the thyroid, resected. 2. Mesothelioma of the right lung. 3. 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 4. Factor V Leiden mutation, heterozygote, with deep venous thrombosis     of the right leg. 5. Lupus anticoagulant positive.  Current therapy:  1) Xarelto 20 mg     p.o. daily.  2) The patient to receive radioactive iodine in a     couple weeks.  INTERIM HISTORY:  Mr. Pauwels comes in for his followup.  It seems like every time we do an evaluation on him, we find something new.  We found that he did have papillary thyroid cancer.  He saw Dr. [name]. Dr. [name] went ahead and did a thyroidectomy.  The pathology report ([id number]) showed multifocal papillary thyroid cancer.  There is lymphovascular space invasion.  There were positive margins.  Lymph nodes were all negative.  He will undergo radioactive iodine treatment.  He feels okay.  He has had no problems with bleeding with the Xarelto.  When we last checked his monoclonal studies back in January, his M spike was 1.35 mg/dL.  He is still smoking 2 packs a day.  This really is going to be the real problem that we are going to have with him.  He has had no cough.  He has had no shortness of breath.  He has had no change in bowel or bladder habits.  PHYSICAL EXAMINATION:  General:  This is an elderly but well-nourished white gentleman in no obvious distress.  Vital signs:  Temperature of 97.3, pulse 52, respiratory rate 16, blood pressure 133/65.  Weight is 193.  Head and neck:  Normocephalic, atraumatic skull.  There are no ocular or oral lesions.  There are no palpable cervical or supraclavicular lymph nodes.  He has a well-healed thyroidectomy scar. Lungs:  Clear bilaterally.  Cardiac:  Regular rate and rhythm with a normal S1 and S2.  There are no murmurs, rubs, or  bruits.  Abdomen: Soft with good bowel sounds.  There is no palpable abdominal mass. There is no palpable hepatosplenomegaly.  Back:  No tenderness over the spine, ribs, or hips.  Extremities:  Show no clubbing, cyanosis, or edema.  Neurological:  Shows no focal neurological deficits.  LABORATORY STUDIES:  White cell count is 7.1, hemoglobin 14.5, hematocrit 43.9, platelet count 142.  IMPRESSION:  Mr. Jipson is a 79 year old gentleman with multiple issues. When we first saw him, he had the deep venous thrombosis of the right leg.  We found that he has both heterozygote for Factor V Leiden and lupus anticoagulant positive.  We then found that he had a mesothelioma of the right lung.  He had a thoracoscopy for this with biopsy-positive disease.  He is asymptomatic with this.  We then found he had a papillary carcinoma of the thyroid.  He underwent thyroidectomy.  He will have radioactive iodine therapy for this.  For now, I think we can just follow him along.  I really do not think that we need to do any scans on him.  I think the next time we do a scan on him will be this summer.  His mesothelioma was pretty much found incidentally.  He had a thoracoscopy, and this really was difficult to find the area of involvement.  He just has a very focal area of involvement.  We will continue him on the  Xarelto.  I think one question might be how long to keep him on Xarelto.  I think 1 year would be appropriate.    ______________________________ Josph Macho, M.D. PRE/MEDQ  D:  05/03/2012  T:  05/04/2012  Job:  [id number]

## 2012-05-06 ENCOUNTER — Telehealth (INDEPENDENT_AMBULATORY_CARE_PROVIDER_SITE_OTHER): Payer: Self-pay

## 2012-05-06 NOTE — Telephone Encounter (Signed)
I called Dr Daune Perch office to verify that pt was given appt. His office states pt was seen By Dr Sharl Ma 4-40-14.

## 2012-05-07 LAB — COMPREHENSIVE METABOLIC PANEL
AST: 10 U/L (ref 0–37)
Alkaline Phosphatase: 82 U/L (ref 39–117)
BUN: 20 mg/dL (ref 6–23)
Creatinine, Ser: 1.24 mg/dL (ref 0.50–1.35)
Total Bilirubin: 0.5 mg/dL (ref 0.3–1.2)

## 2012-05-07 LAB — IFE INTERPRETATION

## 2012-05-07 LAB — KAPPA/LAMBDA LIGHT CHAINS
Kappa free light chain: 2 mg/dL — ABNORMAL HIGH (ref 0.33–1.94)
Lambda Free Lght Chn: 8.59 mg/dL — ABNORMAL HIGH (ref 0.57–2.63)

## 2012-05-07 LAB — PROTEIN ELECTROPHORESIS, SERUM, WITH REFLEX
Alpha-1-Globulin: 3.5 % (ref 2.9–4.9)
Alpha-2-Globulin: 12.3 % — ABNORMAL HIGH (ref 7.1–11.8)
M-Spike, %: 1 g/dL
Total Protein, Serum Electrophoresis: 6.8 g/dL (ref 6.0–8.3)

## 2012-05-07 LAB — IGG, IGA, IGM: IgM, Serum: 94 mg/dL (ref 41–251)

## 2012-05-07 LAB — D-DIMER, QUANTITATIVE: D-Dimer, Quant: 0.78 ug/mL-FEU — ABNORMAL HIGH (ref 0.00–0.48)

## 2012-05-09 ENCOUNTER — Telehealth: Payer: Self-pay | Admitting: *Deleted

## 2012-05-09 NOTE — Telephone Encounter (Signed)
Called patient to let him know that his labwork looks better per dr. Myna Hidalgo

## 2012-05-09 NOTE — Telephone Encounter (Signed)
Message copied by Anselm Jungling on Thu May 09, 2012 11:17 AM ------      Message from: Arlan Organ R      Created: Thu May 09, 2012  6:55 AM       Call - labs look better!!! Cindee Lame ------

## 2012-05-13 ENCOUNTER — Ambulatory Visit (INDEPENDENT_AMBULATORY_CARE_PROVIDER_SITE_OTHER): Payer: Medicare Other | Admitting: Family Medicine

## 2012-05-13 ENCOUNTER — Encounter: Payer: Self-pay | Admitting: Family Medicine

## 2012-05-13 VITALS — BP 156/68 | HR 49 | Temp 97.5°F | Ht 68.0 in | Wt 197.0 lb

## 2012-05-13 DIAGNOSIS — E079 Disorder of thyroid, unspecified: Secondary | ICD-10-CM

## 2012-05-13 DIAGNOSIS — I10 Essential (primary) hypertension: Secondary | ICD-10-CM

## 2012-05-13 DIAGNOSIS — Z86718 Personal history of other venous thrombosis and embolism: Secondary | ICD-10-CM

## 2012-05-13 DIAGNOSIS — J449 Chronic obstructive pulmonary disease, unspecified: Secondary | ICD-10-CM

## 2012-05-13 DIAGNOSIS — R269 Unspecified abnormalities of gait and mobility: Secondary | ICD-10-CM

## 2012-05-13 DIAGNOSIS — R040 Epistaxis: Secondary | ICD-10-CM

## 2012-05-13 DIAGNOSIS — F0390 Unspecified dementia without behavioral disturbance: Secondary | ICD-10-CM

## 2012-05-13 DIAGNOSIS — R2681 Unsteadiness on feet: Secondary | ICD-10-CM

## 2012-05-13 DIAGNOSIS — R42 Dizziness and giddiness: Secondary | ICD-10-CM

## 2012-05-13 DIAGNOSIS — C73 Malignant neoplasm of thyroid gland: Secondary | ICD-10-CM

## 2012-05-13 HISTORY — DX: Epistaxis: R04.0

## 2012-05-13 MED ORDER — MUPIROCIN 2 % EX OINT: TOPICAL_OINTMENT | Freq: Every day | CUTANEOUS | Status: DC

## 2012-05-13 NOTE — Assessment & Plan Note (Signed)
Has done well after surgery and is following closely with endocrinology at this time. Will request records from endocrinology to evaluate plan

## 2012-05-13 NOTE — Assessment & Plan Note (Signed)
Tolerating Xarelto 

## 2012-05-13 NOTE — Progress Notes (Signed)
Patient ID: Steve [name], male   DOB: 17-Mar-1933, 79 y.o.   MRN: [id number] Steve [name] [id number] 08-14-1933 05/13/2012      Progress Note-Follow Up  Subjective  Chief Complaint  Chief Complaint  Patient presents with  . Follow-up    6 week    HPI  Is a 79 year old Caucasian male who is in today, his wife. He thyroid surgery. They've recently increased his Synthroid dose to 125. He denies any trouble with chest pain or palpitations. No worsening shortness of breath although he does note a mild cough and dyspnea which is stable. Did have a scant bit of epistaxis of the left nares today. Was able to control easily and has had some nasal irritation secondary to pollen recently. No fevers or chills. Did not take his blood pressure medications as he was leaving for his appointment today.  Past Medical History  Diagnosis Date  . Prostate enlargement   . GERD (gastroesophageal reflux disease)   . AAA (abdominal aortic aneurysm)     stress test 09/14/10 [facility]  . Anxiety   . Shortness of breath     with exertion   . Recurrent upper respiratory infection (URI)     productive cough- white phlegm- no fever  . Hypertension     chest x ray 12/12 EP[facility]- repeated 06/06/11, EKG 11/12 [facility]  . DVT (deep venous thrombosis) 08/2011  . Pneumonia   . Bruises easily     takes Xarelto  . Depression   . HTN (hypertension)   . Dysrhythmia     hx of atrial fib post op x 2 days   . Atrial fibrillation   . Mesothelioma   . Arthritis     knee and back  . Sleep apnea      12/12 sleep study,SEVERE per study  Dr Delton Coombes- states doesnt wear machine on regular basis- setting BiPAP 9/9  . Sleep apnea     o2 at night 2 liters  . H/O hiatal hernia   . COPD (chronic obstructive pulmonary disease)     patient uses oxygen 2L/ at nite   . Epistaxis 05/13/2012    Past Surgical History  Procedure Laterality Date  . Knee surgery  1957    left knee arthotomy  . Abdominal aortic aneurysm repair  11/14/2010     AAA Repair    Dr Arbie Cookey  . Transurethral resection of prostate  01/25/2011    TURP  . Incisional hernia repair  06/12/2011    Procedure: HERNIA REPAIR INCISIONAL;  Surgeon: Velora Heckler, MD;  Location: [facility] ORS;  Service: General;  Laterality: N/A;  Open Repair Ventral Incisional Hernia with Mesh  . Hernia repair    . Video assisted thoracoscopy  11/02/2011    Procedure: VIDEO ASSISTED THORACOSCOPY;  Surgeon: Loreli Slot, MD;  Location: [facility] OR;  Service: Thoracic;  Laterality: Right;  . Pleural biopsy  11/02/2011    Procedure: PLEURAL BIOPSY;  Surgeon: Loreli Slot, MD;  Location: [facility] OR;  Service: Thoracic;  Laterality: Right;  Parietal and visceral biopsies  . Chest tube insertion  11/02/2011    Procedure: INSERTION PLEURAL DRAINAGE CATHETER;  Surgeon: Loreli Slot, MD;  Location: [facility] OR;  Service: Thoracic;  Laterality: Right;  . Decortication  11/02/2011    Procedure: DECORTICATION;  Surgeon: Loreli Slot, MD;  Location: [facility] OR;  Service: Thoracic;  Laterality: Right;  . Pleural effusion drainage  11/02/2011    Procedure: DRAINAGE OF PLEURAL EFFUSION;  Surgeon:  Loreli Slot, MD;  Location: [facility] OR;  Service: Thoracic;  Laterality: Right;  . Cholecystectomy  01/19/2012    Procedure: LAPAROSCOPIC CHOLECYSTECTOMY WITH INTRAOPERATIVE CHOLANGIOGRAM;  Surgeon: Liz Malady, MD;  Location: [facility] OR;  Service: General;  Laterality: N/A;  laparoscopic cholecystectomy with intraoperative cholangiogram  . Ercp  01/22/2012    Procedure: ENDOSCOPIC RETROGRADE CHOLANGIOPANCREATOGRAPHY (ERCP);  Surgeon: Louis Meckel, MD;  Location: [facility] OR;  Service: Endoscopy;  Laterality: N/A;  with stent placement  . Ercp  01/21/2012    Procedure: ENDOSCOPIC RETROGRADE CHOLANGIOPANCREATOGRAPHY (ERCP);  Surgeon: Rachael Fee, MD;  Location: [facility] OR;  Service: Gastroenterology;  Laterality: N/A;  . Esophagogastroduodenoscopy  01/24/2012    Procedure: ESOPHAGOGASTRODUODENOSCOPY (EGD);   Surgeon: Louis Meckel, MD;  Location: [facility] ENDOSCOPY;  Service: Endoscopy;  Laterality: N/A;  remove pancr stent   . Endoscopic retrograde cholangiopancreatography (ercp) with propofol N/A 03/07/2012    Procedure: ENDOSCOPIC RETROGRADE CHOLANGIOPANCREATOGRAPHY (ERCP) WITH PROPOFOL;  Surgeon: Rachael Fee, MD;  Location: [facility] ENDOSCOPY;  Service: Endoscopy;  Laterality: N/A;  remove stent  . Thyroidectomy N/A 04/04/2012    Procedure:  TOTAL THYROIDECTOMY WITH CENTRAL COMPARTMENT LYMPH NODE DISSECTION;  Surgeon: Velora Heckler, MD;  Location: [facility] ORS;  Service: General;  Laterality: N/A;    Family History  Problem Relation Age of Onset  . Arthritis Mother   . Heart failure Mother   . Hypertension Mother   . Diabetes Mother   . Heart failure Daughter     deceased age 25  . Other Daughter     deceased age 73 MVA    History   Social History  . Marital Status: Married    Spouse Name: N/A    Number of Children: 6  . Years of Education: N/A   Occupational History  . retired    Social History Main Topics  . Smoking status: Current Every Day Smoker -- 1.00 packs/day for 60 years    Types: Cigarettes  . Smokeless tobacco: Never Used     Comment: pt using electric cig  . Alcohol Use: No  . Drug Use: No  . Sexually Active: Not Currently   Other Topics Concern  . Not on file   Social History Narrative  . No narrative on file    Current Outpatient Prescriptions on File Prior to Visit  Medication Sig Dispense Refill  . albuterol (PROVENTIL HFA;VENTOLIN HFA) 108 (90 BASE) MCG/ACT inhaler Inhale 2 puffs into the lungs every 6 (six) hours as needed for wheezing.  1 Inhaler  2  . ALPRAZolam (XANAX) 0.25 MG tablet Take 1 tablet (0.25 mg total) by mouth 3 (three) times daily as needed for sleep.  90 tablet  1  . calcium carbonate (OS-CAL - DOSED IN MG OF ELEMENTAL CALCIUM) 1250 MG tablet Take 2 tablets (1,000 mg of elemental calcium total) by mouth 2 (two) times daily.  60 tablet  1  .  metoprolol tartrate (LOPRESSOR) 25 MG tablet Take 12.5 mg by mouth 2 (two) times daily.      . Rivaroxaban (XARELTO) 20 MG TABS Take 1 tablet (20 mg total) by mouth daily. Patient takes at night  30 tablet  3  . senna (SENOKOT) 8.6 MG TABS Take 2 tablets by mouth at bedtime.      . sertraline (ZOLOFT) 100 MG tablet Take 100 mg by mouth daily before breakfast.      . SPIRIVA HANDIHALER 18 MCG inhalation capsule INHALE ONE CAPSULE EVERY DAY  30 capsule  2   No current facility-administered medications on file prior to visit.    Allergies  Allergen Reactions  . Lyrica (Pregabalin) Hives and Itching    Review of Systems  Review of Systems  Constitutional: Positive for malaise/fatigue. Negative for fever.  HENT: Positive for nosebleeds and congestion.   Eyes: Negative for discharge.  Respiratory: Negative for shortness of breath.   Cardiovascular: Negative for chest pain, palpitations and leg swelling.  Gastrointestinal: Negative for nausea, abdominal pain and diarrhea.  Genitourinary: Negative for dysuria.  Musculoskeletal: Positive for falls.  Skin: Negative for rash.  Neurological: Positive for focal weakness and weakness. Negative for loss of consciousness and headaches.  Endo/Heme/Allergies: Negative for polydipsia.  Psychiatric/Behavioral: Positive for memory loss. Negative for depression and suicidal ideas. The patient is not nervous/anxious and does not have insomnia.     Objective  BP 156/68  Pulse 49  Temp(Src) 97.5 F (36.4 C) (Oral)  Ht 5\' 8"  (1.727 m)  Wt 197 lb 0.6 oz (89.377 kg)  BMI 29.97 kg/m2  SpO2 92%  Physical Exam  Physical Exam  Constitutional: He is oriented to person, place, and time and well-developed, well-nourished, and in no distress. No distress.  HENT:  Head: Normocephalic and atraumatic.  Eyes: Conjunctivae are normal.  Neck: Neck supple. No thyromegaly present.  Cardiovascular: Regular rhythm.  Bradycardia present.  Exam reveals no gallop.    Murmur heard.  Systolic murmur is present with a grade of 3/6   No diastolic murmur is present  Mild bradycardia  Pulmonary/Chest: Effort normal and breath sounds normal. No respiratory distress.  Abdominal: He exhibits no distension and no mass. There is no tenderness.  Musculoskeletal: He exhibits no edema.  Neurological: He is alert and oriented to person, place, and time.  Skin: Skin is warm.  Psychiatric: Memory, affect and judgment normal.    Lab Results  Component Value Date   TSH 1.05 08/01/2011   Lab Results  Component Value Date   WBC 7.1 05/03/2012   HGB 14.5 05/03/2012   HCT 43.9 05/03/2012   MCV 94 05/03/2012   PLT 142* 05/03/2012   Lab Results  Component Value Date   CREATININE 1.24 05/03/2012   BUN 20 05/03/2012   NA 140 05/03/2012   K 4.0 05/03/2012   CL 103 05/03/2012   CO2 27 05/03/2012   Lab Results  Component Value Date   ALT 9 05/03/2012   AST 10 05/03/2012   ALKPHOS 82 05/03/2012   BILITOT 0.5 05/03/2012   Lab Results  Component Value Date   CHOL 166 09/07/2010   Lab Results  Component Value Date   HDL 29* 09/07/2010   Lab Results  Component Value Date   LDLCALC 116* 09/07/2010   Lab Results  Component Value Date   TRIG 104 09/07/2010   Lab Results  Component Value Date   CHOLHDL 5.7 09/07/2010     Assessment & Plan  Hypertension Mild elevation today, generally well controlled and only took his bp med for the day right before coming in. Will have him report any concerning symptoms, reassess at next visit.  Thyroid cancer, papillary Has done well after surgery and is following closely with endocrinology at this time. Will request records from endocrinology to evaluate plan  Gait instability Now with recurrent falls, some memory loss and work finding difficulties. Patient and his wife both agree this all got suddenly worse after his first surgery. Concern is for stroke. Repeat MRI brain. Encouraged to use his walker routinely  at this  time.  History of DVT (deep vein thrombosis) Tolerating Xarelto  Epistaxis Scant from left nares. Likely related to some minor irritation from pollen. Given some Mupirocin to apply to nares qhs and they will report if worsens.   COPD (chronic obstructive pulmonary disease) Some mild expiratory wheezing noted on exam today, patient using Spiriva routinely and Proventil as needed.

## 2012-05-13 NOTE — Assessment & Plan Note (Signed)
Scant from left nares. Likely related to some minor irritation from pollen. Given some Mupirocin to apply to nares qhs and they will report if worsens.

## 2012-05-13 NOTE — Assessment & Plan Note (Signed)
Some mild expiratory wheezing noted on exam today, patient using Spiriva routinely and Proventil as needed.

## 2012-05-13 NOTE — Patient Instructions (Addendum)
Rel of rec Dr Sharl Ma Endocrinology, last 6 months.  Nosebleed Nosebleeds can be caused by many conditions including trauma, infections, polyps, foreign bodies, dry mucous membranes or climate, medications and air conditioning. Most nosebleeds occur in the front of the nose. It is because of this location that most nosebleeds can be controlled by pinching the nostrils gently and continuously. Do this for at least 10 to 20 minutes. The reason for this long continuous pressure is that you must hold it long enough for the blood to clot. If during that 10 to 20 minute time period, pressure is released, the process may have to be started again. The nosebleed may stop by itself, quit with pressure, need concentrated heating (cautery) or stop with pressure from packing. HOME CARE INSTRUCTIONS   If your nose was packed, try to maintain the pack inside until your caregiver removes it. If a gauze pack was used and it starts to fall out, gently replace or cut the end off. Do not cut if a balloon catheter was used to pack the nose. Otherwise, do not remove unless instructed.  Avoid blowing your nose for 12 hours after treatment. This could dislodge the pack or clot and start bleeding again.  If the bleeding starts again, sit up and bending forward, gently pinch the front half of your nose continuously for 20 minutes.  If bleeding was caused by dry mucous membranes, cover the inside of your nose every morning with a petroleum or antibiotic ointment. Use your little fingertip as an applicator. Do this as needed during dry weather. This will keep the mucous membranes moist and allow them to heal.  Maintain humidity in your home by using less air conditioning or using a humidifier.  Do not use aspirin or medications which make bleeding more likely. Your caregiver can give you recommendations on this.  Resume normal activities as able but try to avoid straining, lifting or bending at the waist for several days.  If  the nosebleeds become recurrent and the cause is unknown, your caregiver may suggest laboratory tests. SEEK IMMEDIATE MEDICAL CARE IF:   Bleeding recurs and cannot be controlled.  There is unusual bleeding from or bruising on other parts of the body.  You have a fever.  Nosebleeds continue.  There is any worsening of the condition which originally brought you in.  You become lightheaded, feel faint, become sweaty or vomit blood. MAKE SURE YOU:   Understand these instructions.  Will watch your condition.  Will get help right away if you are not doing well or get worse. Document Released: 10/05/2004 Document Revised: 03/20/2011 Document Reviewed: 11/27/2008 [facility] Patient Information 2013 [place], [place].

## 2012-05-13 NOTE — Assessment & Plan Note (Signed)
Now with recurrent falls, some memory loss and work finding difficulties. Patient and his wife both agree this all got suddenly worse after his first surgery. Concern is for stroke. Repeat MRI brain. Encouraged to use his walker routinely at this time.

## 2012-05-13 NOTE — Assessment & Plan Note (Signed)
Mild elevation today, generally well controlled and only took his bp med for the day right before coming in. Will have him report any concerning symptoms, reassess at next visit.

## 2012-05-18 ENCOUNTER — Ambulatory Visit (HOSPITAL_BASED_OUTPATIENT_CLINIC_OR_DEPARTMENT_OTHER)
Admission: RE | Admit: 2012-05-18 | Discharge: 2012-05-18 | Disposition: A | Discharge status: Home / Self Care | Payer: Medicare Other | Source: Ambulatory Visit | Attending: Family Medicine | Admitting: Family Medicine

## 2012-05-18 DIAGNOSIS — Z9181 History of falling: Secondary | ICD-10-CM | POA: Insufficient documentation

## 2012-05-18 DIAGNOSIS — F0390 Unspecified dementia without behavioral disturbance: Secondary | ICD-10-CM | POA: Insufficient documentation

## 2012-05-18 MED ORDER — GADOBENATE DIMEGLUMINE 529 MG/ML IV SOLN
20.0000 mL | Freq: Once | INTRAVENOUS | Status: AC | PRN
  Administered 2012-05-18: 20 mL via INTRAVENOUS

## 2012-05-27 ENCOUNTER — Encounter (HOSPITAL_COMMUNITY)
Admission: RE | Admit: 2012-05-27 | Discharge: 2012-05-27 | Disposition: A | Discharge status: Home / Self Care | Payer: Medicare Other | Source: Ambulatory Visit | Attending: Internal Medicine | Admitting: Internal Medicine

## 2012-05-27 DIAGNOSIS — C73 Malignant neoplasm of thyroid gland: Secondary | ICD-10-CM

## 2012-05-27 MED ORDER — THYROTROPIN ALFA 1.1 MG IM SOLR
0.9000 mg | INTRAMUSCULAR | Status: AC
  Administered 2012-05-27: 0.9 mg via INTRAMUSCULAR
  Filled 2012-05-27: qty 0.9

## 2012-05-28 ENCOUNTER — Encounter (HOSPITAL_COMMUNITY)
Admission: RE | Admit: 2012-05-28 | Discharge: 2012-05-28 | Disposition: A | Discharge status: Home / Self Care | Payer: Medicare Other | Source: Ambulatory Visit | Attending: Internal Medicine | Admitting: Internal Medicine

## 2012-05-28 MED ORDER — THYROTROPIN ALFA 1.1 MG IM SOLR
0.9000 mg | INTRAMUSCULAR | Status: AC
  Administered 2012-05-28: 0.9 mg via INTRAMUSCULAR
  Filled 2012-05-28: qty 0.9

## 2012-05-29 ENCOUNTER — Encounter (INDEPENDENT_AMBULATORY_CARE_PROVIDER_SITE_OTHER): Payer: Medicare Other | Admitting: Surgery

## 2012-05-29 ENCOUNTER — Encounter (HOSPITAL_COMMUNITY)
Admission: RE | Admit: 2012-05-29 | Discharge: 2012-05-29 | Disposition: A | Discharge status: Home / Self Care | Payer: Medicare Other | Source: Ambulatory Visit | Attending: Internal Medicine | Admitting: Internal Medicine

## 2012-05-29 MED ORDER — SODIUM IODIDE I 131 CAPSULE
120.6000 | Freq: Once | INTRAVENOUS | Status: AC | PRN
  Administered 2012-05-29: 120.6 via ORAL

## 2012-06-10 ENCOUNTER — Encounter (INDEPENDENT_AMBULATORY_CARE_PROVIDER_SITE_OTHER): Payer: Self-pay | Admitting: Surgery

## 2012-06-10 ENCOUNTER — Ambulatory Visit (INDEPENDENT_AMBULATORY_CARE_PROVIDER_SITE_OTHER): Payer: Medicare Other | Admitting: Surgery

## 2012-06-10 ENCOUNTER — Encounter (HOSPITAL_COMMUNITY)
Admission: RE | Admit: 2012-06-10 | Discharge: 2012-06-10 | Disposition: A | Discharge status: Home / Self Care | Payer: Medicare Other | Source: Ambulatory Visit | Attending: Internal Medicine | Admitting: Internal Medicine

## 2012-06-10 ENCOUNTER — Encounter (HOSPITAL_COMMUNITY): Payer: Self-pay

## 2012-06-10 VITALS — BP 128/76 | HR 60 | Temp 98.0°F | Resp 18 | Ht 68.0 in | Wt 198.0 lb

## 2012-06-10 DIAGNOSIS — C73 Malignant neoplasm of thyroid gland: Secondary | ICD-10-CM | POA: Insufficient documentation

## 2012-06-10 NOTE — Patient Instructions (Signed)

## 2012-06-10 NOTE — Progress Notes (Signed)
General Surgery [facility]  Visit Diagnoses: 1. Thyroid cancer, papillary     HISTORY: Patient is a 79 year old male who underwent total thyroidectomy with limited lymph node dissection for carcinoma. On 05/29/2012 he received adjuvant radioactive iodine treatment with 120 mCi of I-131. He is now taking Synthroid 125 mcg daily under the direction of his endocrinologist. Patient has no complaints. Voice quality is good. He has no dysphagia.  EXAM: Surgical incision is well-healed. No palpable masses in the thyroid bed. No anterior or posterior cervical lymphadenopathy. No supraclavicular masses. Voice quality is normal.  IMPRESSION: Status post total thyroidectomy for carcinoma  PLAN: The patient will apply topical creams to his incisions. He will continue close follow-up with his endocrinologist. He will return for surgical follow-up in 6 months.  Velora Heckler, MD, FACS General & Endocrine Surgery [facility]

## 2012-06-17 ENCOUNTER — Telehealth: Payer: Self-pay | Admitting: Family Medicine

## 2012-06-17 MED ORDER — SERTRALINE HCL 100 MG PO TABS: 100.0000 mg | ORAL_TABLET | Freq: Every day | ORAL | Status: DC

## 2012-06-17 NOTE — Telephone Encounter (Signed)
Refill-sertraline 100mg  tab. Take one tablet by mouth every day. Qty 30 last fill 5.8.14

## 2012-06-24 ENCOUNTER — Ambulatory Visit (INDEPENDENT_AMBULATORY_CARE_PROVIDER_SITE_OTHER): Payer: Medicare Other | Admitting: Family Medicine

## 2012-06-24 ENCOUNTER — Encounter: Payer: Self-pay | Admitting: Family Medicine

## 2012-06-24 VITALS — BP 138/58 | HR 54 | Temp 97.4°F | Ht 68.0 in | Wt 195.1 lb

## 2012-06-24 DIAGNOSIS — M7021 Olecranon bursitis, right elbow: Secondary | ICD-10-CM

## 2012-06-24 DIAGNOSIS — M7020 Olecranon bursitis, unspecified elbow: Secondary | ICD-10-CM

## 2012-06-24 DIAGNOSIS — Z9181 History of falling: Secondary | ICD-10-CM

## 2012-06-24 DIAGNOSIS — C73 Malignant neoplasm of thyroid gland: Secondary | ICD-10-CM

## 2012-06-24 DIAGNOSIS — I10 Essential (primary) hypertension: Secondary | ICD-10-CM

## 2012-06-24 DIAGNOSIS — R296 Repeated falls: Secondary | ICD-10-CM

## 2012-06-24 DIAGNOSIS — I480 Paroxysmal atrial fibrillation: Secondary | ICD-10-CM

## 2012-06-24 DIAGNOSIS — I4891 Unspecified atrial fibrillation: Secondary | ICD-10-CM

## 2012-06-24 MED ORDER — SULFAMETHOXAZOLE-TRIMETHOPRIM 800-160 MG PO TABS: 1.0000 | ORAL_TABLET | Freq: Two times a day (BID) | ORAL | Status: DC

## 2012-06-24 NOTE — Patient Instructions (Addendum)
Try alternating ice and heat Take a probiotic, Digestive Advantage  Olecranon Bursitis Bursitis is swelling and soreness (inflammation) of a fluid-filled sac (bursa) that covers and protects a joint. Olecranon bursitis occurs over the elbow.  CAUSES Bursitis can be caused by injury, overuse of the joint, arthritis, or infection.  SYMPTOMS   Tenderness, swelling, warmth, or redness over the elbow.  Elbow pain with movement. This is greater with bending the elbow.  Squeaking sound when the bursa is rubbed or moved.  Increasing size of the bursa without pain or discomfort.  Fever with increasing pain and swelling if the bursa becomes infected. HOME CARE INSTRUCTIONS   Put ice on the affected area.  Put ice in a plastic bag.  Place a towel between your skin and the bag.  Leave the ice on for 15-20 minutes each hour while awake. Do this for the first 2 days.  When resting, elevate your elbow above the level of your heart. This helps reduce swelling.  Continue to put the joint through a full range of motion 4 times per day. Rest the injured joint at other times. When the pain lessens, begin normal slow movements and usual activities.  Only take over-the-counter or prescription medicines for pain, discomfort, or fever as directed by your caregiver.  Reduce your intake of milk and related dairy products (cheese, yogurt). They may make your condition worse. SEEK IMMEDIATE MEDICAL CARE IF:   Your pain increases even during treatment.  You have a fever.  You have heat and inflammation over the bursa and elbow.  You have a red line that goes up your arm.  You have pain with movement of your elbow. MAKE SURE YOU:   Understand these instructions.  Will watch your condition.  Will get help right away if you are not doing well or get worse. Document Released: 01/25/2006 Document Revised: 03/20/2011 Document Reviewed: 12/11/2006 [facility] Patient Information 2014 [place],  [place].

## 2012-06-26 ENCOUNTER — Encounter: Payer: Self-pay | Admitting: Family Medicine

## 2012-06-26 NOTE — Progress Notes (Signed)
Patient ID: Steve [name], male   DOB: 12-07-1933, 79 y.o.   MRN: [id number] Steve [name] [id number] [birth year] 06/26/2012      Progress Note-Follow Up  Subjective  Chief Complaint  Chief Complaint  Patient presents with  . Follow-up    6 week    HPI  Patient is a 79 year old Caucasian male who is in today for followup. Generally doing well. Has just been seen by endocrine and his Synthroid dose was increased to 150 mcg. Feels well. No chest pain, palpitations, shortness of breath, GI or GU complaints. He has noted a swelling on his right elbow for the last 2 weeks. It is mildly tender and occasionally feels warm. No redness, fevers, chills, malaise or myalgias otherwise noted. Denies chest pain, palpitations, shortness of breath, GI or GU concerns. Taking medications as prescribed  Past Medical History  Diagnosis Date  . Prostate enlargement   . GERD (gastroesophageal reflux disease)   . AAA (abdominal aortic aneurysm)     stress test 09/14/10 EPIC  . Anxiety   . Shortness of breath     with exertion   . Recurrent upper respiratory infection (URI)     productive cough- white phlegm- no fever  . Hypertension     chest x ray 12/12 EPIC- repeated 06/06/11, EKG 11/12 EPIC  . DVT (deep venous thrombosis) 08/2011  . Pneumonia   . Bruises easily     takes Xarelto  . Depression   . HTN (hypertension)   . Dysrhythmia     hx of atrial fib post op x 2 days   . Atrial fibrillation   . Mesothelioma   . Arthritis     knee and back  . Sleep apnea      12/12 sleep study,SEVERE per study  Dr Delton Coombes- states doesnt wear machine on regular basis- setting BiPAP 9/9  . Sleep apnea     o2 at night 2 liters  . H/O hiatal hernia   . COPD (chronic obstructive pulmonary disease)     patient uses oxygen 2L/[place] at nite   . Epistaxis 05/13/2012    Past Surgical History  Procedure Laterality Date  . Knee surgery  1957    left knee arthotomy  . Abdominal aortic aneurysm repair  11/14/2010    AAA  Repair    Dr Arbie Cookey  . Transurethral resection of prostate  01/25/2011    TURP  . Incisional hernia repair  06/12/2011    Procedure: HERNIA REPAIR INCISIONAL;  Surgeon: Velora Heckler, MD;  Location: [facility];  Service: General;  Laterality: N/A;  Open Repair Ventral Incisional Hernia with Mesh  . Hernia repair    . Video assisted thoracoscopy  11/02/2011    Procedure: VIDEO ASSISTED THORACOSCOPY;  Surgeon: Loreli Slot, MD;  [facility];  Service: Thoracic;  Laterality: Right;  . Pleural biopsy  11/02/2011    Procedure: PLEURAL BIOPSY;  Surgeon: Loreli Slot, MD;  Location: [facility] OR;  Service: Thoracic;  Laterality: Right;  Parietal and visceral biopsies  . Chest tube insertion  11/02/2011    Procedure: INSERTION PLEURAL DRAINAGE CATHETER;  Surgeon: Loreli Slot, MD;  Location: [facility] OR;  Service: Thoracic;  Laterality: Right;  . Decortication  11/02/2011    Procedure: DECORTICATION;  Surgeon: Loreli Slot, MD;  Location: [facility] OR;  Service: Thoracic;  Laterality: Right;  . Pleural effusion drainage  11/02/2011    Procedure: DRAINAGE OF PLEURAL EFFUSION;  Surgeon: Loreli Slot, MD;  Location: [facility] OR;  Service: Thoracic;  Laterality: Right;  . Cholecystectomy  01/19/2012    Procedure: LAPAROSCOPIC CHOLECYSTECTOMY WITH INTRAOPERATIVE CHOLANGIOGRAM;  Surgeon: Liz Malady, MD;  Location: [facility] OR;  Service: General;  Laterality: N/A;  laparoscopic cholecystectomy with intraoperative cholangiogram  . Ercp  01/22/2012    Procedure: ENDOSCOPIC RETROGRADE CHOLANGIOPANCREATOGRAPHY (ERCP);  Surgeon: Louis Meckel, MD;  Location: [facility] OR;  Service: Endoscopy;  Laterality: N/A;  with stent placement  . Ercp  01/21/2012    Procedure: ENDOSCOPIC RETROGRADE CHOLANGIOPANCREATOGRAPHY (ERCP);  Surgeon: Rachael Fee, MD;  Location: [facility] OR;  Service: Gastroenterology;  Laterality: N/A;  . Esophagogastroduodenoscopy  01/24/2012    Procedure: ESOPHAGOGASTRODUODENOSCOPY (EGD);  Surgeon:  Louis Meckel, MD;  Location: [facility] ENDOSCOPY;  Service: Endoscopy;  Laterality: N/A;  remove pancr stent   . Endoscopic retrograde cholangiopancreatography (ercp) with propofol N/A 03/07/2012    Procedure: ENDOSCOPIC RETROGRADE CHOLANGIOPANCREATOGRAPHY (ERCP) WITH PROPOFOL;  Surgeon: Rachael Fee, MD;  Location: [facility] ENDOSCOPY;  Service: Endoscopy;  Laterality: N/A;  remove stent  . Thyroidectomy N/A 04/04/2012    Procedure:  TOTAL THYROIDECTOMY WITH CENTRAL COMPARTMENT LYMPH NODE DISSECTION;  Surgeon: Velora Heckler, MD;  Location: [facility];  Service: General;  Laterality: N/A;    Family History  Problem Relation Age of Onset  . Arthritis Mother   . Heart failure Mother   . Hypertension Mother   . Diabetes Mother   . Heart failure Daughter     deceased age 60  . Other Daughter     deceased age 64 MVA    History   Social History  . Marital Status: Married    Spouse Name: N/A    Number of Children: 6  . Years of Education: N/A   Occupational History  . retired    Social History Main Topics  . Smoking status: Current Every Day Smoker -- 1.00 packs/day for 60 years    Types: Cigarettes  . Smokeless tobacco: Never Used     Comment: pt using electric cig  . Alcohol Use: No  . Drug Use: No  . Sexually Active: Not Currently   Other Topics Concern  . Not on file   Social History Narrative  . No narrative on file    Current Outpatient Prescriptions on File Prior to Visit  Medication Sig Dispense Refill  . ALPRAZolam (XANAX) 0.25 MG tablet Take 1 tablet (0.25 mg total) by mouth 3 (three) times daily as needed for sleep.  90 tablet  1  . calcium carbonate (OS-CAL - DOSED IN MG OF ELEMENTAL CALCIUM) 1250 MG tablet Take 2 tablets (1,000 mg of elemental calcium total) by mouth 2 (two) times daily.  60 tablet  1  . metoprolol tartrate (LOPRESSOR) 25 MG tablet Take 12.5 mg by mouth 2 (two) times daily.      . mupirocin ointment (BACTROBAN) 2 % Apply topically at bedtime. Apply to nares  qhs x 7days and then as needed  22 g  1  . Rivaroxaban (XARELTO) 20 MG TABS Take 1 tablet (20 mg total) by mouth daily. Patient takes at night  30 tablet  3  . senna (SENOKOT) 8.6 MG TABS Take 2 tablets by mouth at bedtime.      . sertraline (ZOLOFT) 100 MG tablet Take 1 tablet (100 mg total) by mouth daily before breakfast.  30 tablet  3  . SPIRIVA HANDIHALER 18 MCG inhalation capsule INHALE ONE CAPSULE EVERY DAY  30 capsule  2   No  current facility-administered medications on file prior to visit.    Allergies  Allergen Reactions  . Lyrica (Pregabalin) Hives and Itching    Review of Systems  Review of Systems  Constitutional: Negative for fever and malaise/fatigue.  HENT: Negative for congestion.   Eyes: Negative for discharge.  Respiratory: Negative for shortness of breath.   Cardiovascular: Negative for chest pain, palpitations and leg swelling.  Gastrointestinal: Negative for nausea, abdominal pain and diarrhea.  Genitourinary: Negative for dysuria.  Musculoskeletal: Positive for joint pain. Negative for falls.       Swelling right elbow  Skin: Negative for rash.  Neurological: Negative for loss of consciousness and headaches.  Endo/Heme/Allergies: Negative for polydipsia.  Psychiatric/Behavioral: Negative for depression and suicidal ideas. The patient is not nervous/anxious and does not have insomnia.     Objective  BP 138/58  Pulse 54  Temp(Src) 97.4 F (36.3 C) (Oral)  Ht 5\' 8"  (1.727 m)  Wt 195 lb 1.9 oz (88.506 kg)  BMI 29.67 kg/m2  SpO2 92%  Physical Exam  Physical Exam  Constitutional: He is oriented to person, place, and time and well-developed, well-nourished, and in no distress. No distress.  HENT:  Head: Normocephalic and atraumatic.  Eyes: Conjunctivae are normal.  Neck: Neck supple. No thyromegaly present.  Cardiovascular: Normal rate, regular rhythm and normal heart sounds.   No murmur heard. Pulmonary/Chest: Effort normal and breath sounds  normal. No respiratory distress.  Abdominal: He exhibits no distension and no mass. There is no tenderness.  Musculoskeletal: He exhibits edema. He exhibits no tenderness.  2 cm raised, erythematous lesion on right elbow  Neurological: He is alert and oriented to person, place, and time.  Skin: Skin is warm.  Psychiatric: Memory, affect and judgment normal.    Lab Results  Component Value Date   TSH 1.05 08/01/2011   Lab Results  Component Value Date   WBC 7.1 05/03/2012   HGB 14.5 05/03/2012   HCT 43.9 05/03/2012   MCV 94 05/03/2012   PLT 142* 05/03/2012   Lab Results  Component Value Date   CREATININE 1.24 05/03/2012   BUN 20 05/03/2012   NA 140 05/03/2012   K 4.0 05/03/2012   CL 103 05/03/2012   CO2 27 05/03/2012   Lab Results  Component Value Date   ALT 9 05/03/2012   AST 10 05/03/2012   ALKPHOS 82 05/03/2012   BILITOT 0.5 05/03/2012   Lab Results  Component Value Date   CHOL 166 09/07/2010   Lab Results  Component Value Date   HDL 29* 09/07/2010   Lab Results  Component Value Date   LDLCALC 116* 09/07/2010   Lab Results  Component Value Date   TRIG 104 09/07/2010   Lab Results  Component Value Date   CHOLHDL 5.7 09/07/2010     Assessment & Plan  Olecranon bursitis of right elbow Placed on antibiotics, ice and salon pas topically, referred to  Ortho if no resolution  Hypertension Well controlled today no changes  Paroxysmal a-fib Regular rhythm today  Thyroid cancer, papillary Maintained on Synthroid, just saw endocrinology and his dose was increased to 150 mcg

## 2012-06-28 ENCOUNTER — Encounter: Payer: Self-pay | Admitting: Family Medicine

## 2012-06-28 DIAGNOSIS — M7021 Olecranon bursitis, right elbow: Secondary | ICD-10-CM | POA: Insufficient documentation

## 2012-06-28 HISTORY — DX: Olecranon bursitis, right elbow: M70.21

## 2012-06-28 NOTE — Assessment & Plan Note (Signed)
Maintained on Synthroid, just saw endocrinology and his dose was increased to 150 mcg

## 2012-06-28 NOTE — Assessment & Plan Note (Signed)
Placed on antibiotics, ice and salon pas topically, referred to  Ortho if no resolution

## 2012-06-28 NOTE — Assessment & Plan Note (Signed)
Regular rhythm today. 

## 2012-06-28 NOTE — Assessment & Plan Note (Signed)
Well controlled today no changes 

## 2012-07-01 ENCOUNTER — Other Ambulatory Visit: Payer: Self-pay | Admitting: Emergency Medicine

## 2012-07-11 ENCOUNTER — Encounter: Payer: Self-pay | Admitting: Neurology

## 2012-07-11 ENCOUNTER — Ambulatory Visit (INDEPENDENT_AMBULATORY_CARE_PROVIDER_SITE_OTHER): Payer: Medicare Other | Admitting: Neurology

## 2012-07-11 VITALS — BP 152/70 | HR 54 | Temp 97.5°F | Ht 68.0 in | Wt 196.0 lb

## 2012-07-11 DIAGNOSIS — Z9181 History of falling: Secondary | ICD-10-CM

## 2012-07-11 DIAGNOSIS — R296 Repeated falls: Secondary | ICD-10-CM

## 2012-07-11 LAB — LIPID PANEL
Total CHOL/HDL Ratio: 6
Triglycerides: 128 mg/dL (ref 0.0–149.0)

## 2012-07-11 NOTE — Progress Notes (Signed)
NEUROLOGY CONSULTATION NOTE  TIRTH COTHRON MRN: [id number] DOB: [birth year]  Referring physician: Dr. Abner Greenspan Primary care physician: Dr. Abner Greenspan  Reason for consult:  Frequent falls.  HISTORY OF PRESENT ILLNESS: BRAIN HONEYCUTT is a 79 y.o. male who presents for the evaluation of frequent falls.he has a complicated past medical history, consisted of atrial fibrillation, factor V Leiden gene mutation, positive lupus anticoagulant, IgA lambda MGUS, DVT, HTN, AAA repair, COPD, and papillary thyroid cancer.  He also has a reported diagnosis of dementia.  He is on Xarelto.  He previously saw another neurologist, Dr. Denton Meek, last summer. He was subsequently worked up for a polyneuropathy, which revealed an IgA lambda MGUS. He was referred to oncology.  He did not have any evidence of myeloma.  He did have a NCS/EMG performed, which did not reveal any electrodiagnostic evidence for polyneuropathy. Therefore, it was determined that the MGUS was unrelated. On 09/21/11, MRI of the L-spine was performed and revealed mild degenerative disc disease without any stenosis, as well as a chronic compression fracture and of the T12 and L1 vertebrae.  He was also evaluated for presyncope.  A Holter monitor was not revealing.  Over the last couple of years, he has had 2 MRIs of the brain performed, with and without contrast. The MRI of the brain performed 05/13/12 revealed generalized atrophy and chronic white matter ischemic changes with no mass lesions or hemorrhage. The ventricles appeared in proportion to the atrophy. Chronic small lacunar infarct in the right cerebellum was also noted. There was no abnormal enhancement. This was stable compared to a prior study in August of 2012.  Ultimately, so etiology was found.it was also suggested that his Xanax may be contributing, however he has not noted any difference when he stopped taking it. Also he does not take a high dose.  He continues to fall approximately once a  week, sometimes hitting his head.symptoms started in 2012. He and his wife explained that they seemed fairly sudden onset. He denies any dizziness or vertiginous symptoms. He denies any fullness in the ears or tinnitus. He does have some hearing loss on the left. He denies difficulty feeling the ground. Initially, he noted burning sensation in the feet but that has since resolved. He notes generalized weakness but no focal weakness in the legs. Basically, when he walks, he just feels unsteady. His wife notes that he seems to veer more towards the right. He denies any back pain that radiates down the legs. He denies any neck pain.  When he falls, he just falls backwards. He does have symptoms consistent with orthostatic hypotension and needs to take his time before standing up. However, he does not feel dizzy or lightheaded when he has his falls. He has not had any loss of consciousness.Symptoms have been stable over the past year. He has been prescribed a cane and a walker, but he does not use them. He has been to physical therapy on several occasions, which have been ineffective. He is a smoker, and smokes about 2 packs a day for 60 years. He quit drinking alcohol approximately 18 years ago.  As per the chart, he is carried a diagnosis of dementia. Specifically, he has memory problems. Symptoms started approximately one year ago. Typically, he will occasionally forget to take the medicine, and he may at times forget names of acquaintances or friends. He does drive but has never had any difficulty or have gotten lost in familiar areas. When he takes a nap  during the day, he may be briefly confused for a few minutes after he wakes up. He has not had any change in personality or agitation.  Prior labs: 07/2011: hemoglobin A1c 6.0, B12 402, TSH 1.05, ESR 83, IgA MGUS.  09/07/10: LDL 116.  All mentioned images were personally reviewed.  PAST MEDICAL HISTORY: Past Medical History  Diagnosis Date  . Prostate  enlargement   . GERD (gastroesophageal reflux disease)   . AAA (abdominal aortic aneurysm)     stress test 09/14/10 EPIC  . Anxiety   . Shortness of breath     with exertion   . Recurrent upper respiratory infection (URI)     productive cough- white phlegm- no fever  . Hypertension     chest x ray 12/12 EPIC- repeated 06/06/11, EKG 11/12 EPIC  . DVT (deep venous thrombosis) 08/2011  . Pneumonia   . Bruises easily     takes Xarelto  . Depression   . HTN (hypertension)   . Dysrhythmia     hx of atrial fib post op x 2 days   . Atrial fibrillation   . Mesothelioma   . Arthritis     knee and back  . Sleep apnea      12/12 sleep study,SEVERE per study  Dr Delton Coombes- states doesnt wear machine on regular basis- setting BiPAP 9/9  . Sleep apnea     o2 at night 2 liters  . H/O hiatal hernia   . COPD (chronic obstructive pulmonary disease)     patient uses oxygen 2L/[place] at nite   . Epistaxis 05/13/2012  . Olecranon bursitis of right elbow 06/28/2012    PAST SURGICAL HISTORY: Past Surgical History  Procedure Laterality Date  . Knee surgery  1957    left knee arthotomy  . Abdominal aortic aneurysm repair  11/14/2010    AAA Repair    Dr Arbie Cookey  . Transurethral resection of prostate  01/25/2011    TURP  . Incisional hernia repair  06/12/2011    Procedure: HERNIA REPAIR INCISIONAL;  Surgeon: Velora Heckler, MD;  Location: [facility];  Service: General;  Laterality: N/A;  Open Repair Ventral Incisional Hernia with Mesh  . Hernia repair    . Video assisted thoracoscopy  11/02/2011    Procedure: VIDEO ASSISTED THORACOSCOPY;  Surgeon: Loreli Slot, MD;  Location: [facility] OR;  Service: Thoracic;  Laterality: Right;  . Pleural biopsy  11/02/2011    Procedure: PLEURAL BIOPSY;  Surgeon: Loreli Slot, MD;  Location: [facility] OR;  Service: Thoracic;  Laterality: Right;  Parietal and visceral biopsies  . Chest tube insertion  11/02/2011    Procedure: INSERTION PLEURAL DRAINAGE CATHETER;  Surgeon: Loreli Slot, MD;  Location: [facility] OR;  Service: Thoracic;  Laterality: Right;  . Decortication  11/02/2011    Procedure: DECORTICATION;  Surgeon: Loreli Slot, MD;  Location: [facility] OR;  Service: Thoracic;  Laterality: Right;  . Pleural effusion drainage  11/02/2011    Procedure: DRAINAGE OF PLEURAL EFFUSION;  Surgeon: Loreli Slot, MD;  Location: [facility] OR;  Service: Thoracic;  Laterality: Right;  . Cholecystectomy  01/19/2012    Procedure: LAPAROSCOPIC CHOLECYSTECTOMY WITH INTRAOPERATIVE CHOLANGIOGRAM;  Surgeon: Liz Malady, MD;  Location: [facility] OR;  Service: General;  Laterality: N/A;  laparoscopic cholecystectomy with intraoperative cholangiogram  . Ercp  01/22/2012    Procedure: ENDOSCOPIC RETROGRADE CHOLANGIOPANCREATOGRAPHY (ERCP);  Surgeon: Louis Meckel, MD;  Location: [facility] OR;  Service: Endoscopy;  Laterality: N/A;  with stent  placement  . Ercp  01/21/2012    Procedure: ENDOSCOPIC RETROGRADE CHOLANGIOPANCREATOGRAPHY (ERCP);  Surgeon: Rachael Fee, MD;  Location: [facility] OR;  Service: Gastroenterology;  Laterality: N/A;  . Esophagogastroduodenoscopy  01/24/2012    Procedure: ESOPHAGOGASTRODUODENOSCOPY (EGD);  Surgeon: Louis Meckel, MD;  Location: [facility] ENDOSCOPY;  Service: Endoscopy;  Laterality: N/A;  remove pancr stent   . Endoscopic retrograde cholangiopancreatography (ercp) with propofol N/A 03/07/2012    Procedure: ENDOSCOPIC RETROGRADE CHOLANGIOPANCREATOGRAPHY (ERCP) WITH PROPOFOL;  Surgeon: Rachael Fee, MD;  Location: [facility] ENDOSCOPY;  Service: Endoscopy;  Laterality: N/A;  remove stent  . Thyroidectomy N/A 04/04/2012    Procedure:  TOTAL THYROIDECTOMY WITH CENTRAL COMPARTMENT LYMPH NODE DISSECTION;  Surgeon: Velora Heckler, MD;  Location: [facility];  Service: General;  Laterality: N/A;    MEDICATIONS: Current Outpatient Prescriptions on File Prior to Visit  Medication Sig Dispense Refill  . ALPRAZolam (XANAX) 0.25 MG tablet Take 1 tablet (0.25 mg total) by mouth 3 (three) times  daily as needed for sleep.  90 tablet  1  . calcium carbonate (OS-CAL - DOSED IN MG OF ELEMENTAL CALCIUM) 1250 MG tablet Take 2 tablets (1,000 mg of elemental calcium total) by mouth 2 (two) times daily.  60 tablet  1  . Cholecalciferol (VITAMIN D3) 1000 UNITS CAPS Take 1 capsule by mouth daily.      Marland Kitchen levothyroxine (SYNTHROID, LEVOTHROID) 150 MCG tablet Take 150 mcg by mouth daily before breakfast.      . metoprolol tartrate (LOPRESSOR) 25 MG tablet Take 12.5 mg by mouth 2 (two) times daily.      . mupirocin ointment (BACTROBAN) 2 % Apply topically at bedtime. Apply to nares qhs x 7days and then as needed  22 g  1  . Rivaroxaban (XARELTO) 20 MG TABS Take 1 tablet (20 mg total) by mouth daily. Patient takes at night  30 tablet  3  . senna (SENOKOT) 8.6 MG TABS Take 2 tablets by mouth at bedtime.      . sertraline (ZOLOFT) 100 MG tablet Take 1 tablet (100 mg total) by mouth daily before breakfast.  30 tablet  3  . SPIRIVA HANDIHALER 18 MCG inhalation capsule INHALE ONE CAPSULE ONCE DAILY  30 capsule  0  . sulfamethoxazole-trimethoprim (BACTRIM DS,SEPTRA DS) 800-160 MG per tablet Take 1 tablet by mouth 2 (two) times daily.  20 tablet  0   No current facility-administered medications on file prior to visit.    ALLERGIES: Allergies  Allergen Reactions  . Lyrica (Pregabalin) Hives and Itching    FAMILY HISTORY: Family History  Problem Relation Age of Onset  . Arthritis Mother   . Heart failure Mother   . Hypertension Mother   . Diabetes Mother   . Heart failure Daughter     deceased age 31  . Other Daughter     deceased age 57 MVA    SOCIAL HISTORY: History   Social History  . Marital Status: Married    Spouse Name: N/A    Number of Children: 6  . Years of Education: N/A   Occupational History  . retired    Social History Main Topics  . Smoking status: Current Every Day Smoker -- 1.00 packs/day for 60 years    Types: Cigarettes  . Smokeless tobacco: Never Used      Comment: pt using electric cig  . Alcohol Use: No  . Drug Use: No  . Sexually Active: Not Currently   Other Topics Concern  . Not  on file   Social History Narrative  . No narrative on file    PHYSICAL EXAM: Filed Vitals:   07/11/12 0909  BP: 152/70  Pulse: 54  Temp: 97.5 F (36.4 C)   General: No acute distress Head:  Normocephalic/atraumatic, TM normal Neck: supple, no paraspinal tenderness, full range of motion Back: No paraspinal tenderness Heart: regular rate and rhythm Lungs: Clear to auscultation bilaterally. Neurological Exam: Mental status: alert and oriented to person, place, time and self, speech fluent and not dysarthric, language intact. Exhibit some difficulty with serial 7 subtraction. Able to recall 3 out of 3 words after a couple of minutes. Able to repeat and follow three-step commands across midline. He exhibited some difficulty copying intersecting pentagons correctly. Mini-Mental Status exam score 26/30. Cranial nerves: CN I: not tested CN II: pupils equal, round and reactive to light, visual fields intact, fundi unremarkable CN III, IV, VI:  full range of motion, no nystagmus, no ptosis CN V: facial sensation intact CN VII: upper and lower face symmetric CN VIII: hearing reduced on the left CN IX, X: gag intact, uvula midline CN XI: sternocleidomastoid and trapezius muscles intact CN XII: tongue midline Bulk & Tone: normal, no fasciculations. Muscle strength: 5/5 throughout Sensation: reduced vibration sensation up to below the knees, reduced proprioception in the toes. Pinprick sensation intact Deep Tendon Reflexes: 2+ throughout, 1+ in the ankles. Finger to nose testing: normal Heel to shin: normal Gait:  Mildly wide-based and unsteady, mostly to the right. Romberg with sway.  IMPRESSION & PLAN: Steve [name] is a 79 y.o. male with 2 year history of gait instability and frequent falls, as well as one year history of memory problems.  I think the  etiology is multifactorial, namely cerebrovascular and possibly peripheral neuropathy.  Given the sudden onset of symptoms, physical exam, and MRI results,the amount of small vessel ischemic changes, as well as the punctate infarct in the right cerebellum, a cerebrovascular etiology may be contributing to his gait instability. This vascular etiology may play a part in mild cognitive impairment as well.  Physical exam does point to long track signs consistent with a peripheral neuropathy, although prior EMG was unremarkable and workup for an etiology has not been fruitful.  At this point, I would optimize control of his stroke risk factors. 1.  I would recheck a fasting lipid profile.  He may need to be started on a statin. Given his stroke risk factors, and LDL of less than 100 may be appropriate. 2.  Continue anticoagulation. I optimize blood pressure control. 3.  A repeat NCS/EMG may be considered to look for any new electrodiagnostic evidence of neuropathy. However we will forego this, as it would likely not change management. 4.  I strongly urged him to start using his walker at all times, and to keep it at bedside if he gets up at night. Given that he is on an anticoagulant, he is at very high risk for serious accidents if he should fall. 5.  Smoking cessation 6.  I would like to see him again in approximately 6 months or as needed.  Thank you for allowing me to take part in the care of this patient.  Shon Millet, DO  CC: Danise Edge, MD

## 2012-07-11 NOTE — Patient Instructions (Addendum)
At this point, I think the balance problems may be caused by two things.  1) vascular, meaning tiny areas of the brain are injured due to stroke risk factors like high blood pressure.  You do have symptoms of nerve problems in the feet, however prior testing did not find a cause. 1.  I would like to recheck your cholesterol to help prevent any potential stroke. 2.  Continue blood thinner as prescribed, as well as blood pressure control 3.  We may consider testing for nerve problems again, however I don't think it would change treatment 4.  I strongly urge you to use your walker at ALL times.  Keep it at your bedside as well.  Your are at very high risk of a serious complication such as hip fracture, and bleeding in the brain from falling, since you are on a blood thinner. 5.  I want to see you back in 6 months to make sure everything is stable.  Call if you have any questions or concerns.

## 2012-07-15 ENCOUNTER — Telehealth: Payer: Self-pay | Admitting: Family Medicine

## 2012-07-15 NOTE — Telephone Encounter (Signed)
Refill-alprazolam 0.25mg  tab. Take one tablet by mouth three times daily as needed for anxiety. Qty 90 last fill 4.25.14

## 2012-07-15 NOTE — Telephone Encounter (Signed)
OK to refill Alprazolam same strength, same sig, same number

## 2012-07-15 NOTE — Telephone Encounter (Signed)
eScribe request for refill on AlprAZOLAM Last filled - 03.24.14, #[id number] Last AEX - 06.16.14 Please advise on refills/SLS

## 2012-07-16 MED ORDER — ALPRAZOLAM 0.25 MG PO TABS: 0.2500 mg | ORAL_TABLET | Freq: Three times a day (TID) | ORAL | Status: DC | PRN

## 2012-07-16 NOTE — Telephone Encounter (Signed)
RX sent

## 2012-07-18 ENCOUNTER — Telehealth: Payer: Self-pay | Admitting: Hematology & Oncology

## 2012-07-18 NOTE — Telephone Encounter (Signed)
Due to MD working half a day on 08/02/12 (on call), patient's 08/02/12 apt was cx and resch for 08/01/12.  Patient was called, did not get answer so vm was left of change apt date/time.  Calendar was also mailed out to patient's home

## 2012-07-25 ENCOUNTER — Telehealth: Payer: Self-pay | Admitting: Hematology & Oncology

## 2012-07-25 NOTE — Telephone Encounter (Signed)
Faxed Medical Records via fax today toEulah Pont       [facility]: [phone] or [phone]  Medical  Records requested from 11/24/2011 to present   CONSENT COPY SCANNED

## 2012-08-01 ENCOUNTER — Ambulatory Visit (HOSPITAL_BASED_OUTPATIENT_CLINIC_OR_DEPARTMENT_OTHER): Payer: Medicare Other | Admitting: Lab

## 2012-08-01 ENCOUNTER — Ambulatory Visit (HOSPITAL_BASED_OUTPATIENT_CLINIC_OR_DEPARTMENT_OTHER): Payer: Medicare Other | Admitting: Hematology & Oncology

## 2012-08-01 VITALS — BP 179/65 | HR 51 | Temp 97.6°F | Resp 18 | Ht 68.0 in | Wt 199.0 lb

## 2012-08-01 DIAGNOSIS — C384 Malignant neoplasm of pleura: Secondary | ICD-10-CM

## 2012-08-01 DIAGNOSIS — C450 Mesothelioma of pleura: Secondary | ICD-10-CM

## 2012-08-01 DIAGNOSIS — C73 Malignant neoplasm of thyroid gland: Secondary | ICD-10-CM

## 2012-08-01 DIAGNOSIS — Z86718 Personal history of other venous thrombosis and embolism: Secondary | ICD-10-CM

## 2012-08-01 LAB — CBC WITH DIFFERENTIAL (CANCER CENTER ONLY)
BASO#: 0 10*3/uL (ref 0.0–0.2)
BASO%: 0.1 % (ref 0.0–2.0)
Eosinophils Absolute: 0.1 10*3/uL (ref 0.0–0.5)
HCT: 41.5 % (ref 38.7–49.9)
HGB: 13.9 g/dL (ref 13.0–17.1)
LYMPH#: 1.7 10*3/uL (ref 0.9–3.3)
MONO#: 0.6 10*3/uL (ref 0.1–0.9)
NEUT#: 5.8 10*3/uL (ref 1.5–6.5)
NEUT%: 70.4 % (ref 40.0–80.0)
RBC: 4.26 10*6/uL (ref 4.20–5.70)
WBC: 8.2 10*3/uL (ref 4.0–10.0)

## 2012-08-01 NOTE — Progress Notes (Signed)
This office note has been dictated.

## 2012-08-02 ENCOUNTER — Ambulatory Visit: Payer: Medicare Other | Admitting: Hematology & Oncology

## 2012-08-02 ENCOUNTER — Ambulatory Visit (INDEPENDENT_AMBULATORY_CARE_PROVIDER_SITE_OTHER): Payer: Medicare Other | Admitting: Family Medicine

## 2012-08-02 ENCOUNTER — Other Ambulatory Visit: Payer: Medicare Other | Admitting: Lab

## 2012-08-02 ENCOUNTER — Encounter: Payer: Self-pay | Admitting: Family Medicine

## 2012-08-02 VITALS — BP 150/80 | HR 58 | Temp 98.2°F | Ht 68.0 in | Wt 198.1 lb

## 2012-08-02 DIAGNOSIS — J449 Chronic obstructive pulmonary disease, unspecified: Secondary | ICD-10-CM

## 2012-08-02 DIAGNOSIS — I4891 Unspecified atrial fibrillation: Secondary | ICD-10-CM

## 2012-08-02 DIAGNOSIS — J4489 Other specified chronic obstructive pulmonary disease: Secondary | ICD-10-CM

## 2012-08-02 DIAGNOSIS — K439 Ventral hernia without obstruction or gangrene: Secondary | ICD-10-CM

## 2012-08-02 DIAGNOSIS — I10 Essential (primary) hypertension: Secondary | ICD-10-CM

## 2012-08-02 DIAGNOSIS — C73 Malignant neoplasm of thyroid gland: Secondary | ICD-10-CM

## 2012-08-02 MED ORDER — LISINOPRIL 10 MG PO TABS: 10.0000 mg | ORAL_TABLET | Freq: Two times a day (BID) | ORAL | Status: DC

## 2012-08-02 NOTE — Progress Notes (Signed)
CC:   Steve [name]. Dorris Fetch, M.D. Gabrielle Dare [name], M.D. Velora Heckler, MD Danise Edge, MD  DIAGNOSES: 1. Papillary carcinoma of the thyroid, status post thyroidectomy. 2. Mesothelioma of the right lung, localized. 3. 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 4. Deep vein thrombosis of the right leg secondary to factor V Leiden     mutation (heterozygote) and lupus anticoagulant.  CURRENT THERAPY:  Xarelto 20 mg p.o. daily.  INTERIM HISTORY:  Steve [name] comes in for his followup.  He actually is looking quite good.  Unfortunately, he is still smoking 2 packs a day of cigarettes.  He does have some shortness of breath.  I told him that this is not from the mesothelioma but from his smoking.  We probably do need to do a PET scan on him to see how the mesothelioma is behaving.  His last monoclonal studies done back in April showed his monoclonal spike to be 1.0 g/dL.  IgA level was 1290 mg/dL.  Lambda light chain was 8.6 mg/dL.  He has had no fevers.  He has had no bleeding.  He is doing well on the Xarelto.  PHYSICAL EXAMINATION:  General:  This is a well-developed, well- nourished white gentleman in no obvious distress.  Vital signs:  Show a temperature of 97.6, pulse 51, respiratory rate 18, blood pressure 180/70.  Weight is 199.  Head and neck:  Normocephalic, atraumatic skull.  There are no ocular or oral lesions.  There are no palpable cervical or supraclavicular lymph nodes.  Lungs:  Clear bilaterally. Cardiac:  Regular rate and rhythm with a normal S1 and S2.  There are no murmurs, rubs or bruits.  Abdomen:  Soft.  He has good bowel sounds. There is no palpable abdominal mass.  There is no palpable hepatosplenomegaly.  Back:  Shows the thoracoscopy scar in the right lateral chest wall.  There is no tenderness over the spine, ribs, or hips.  Extremities:  Show no clubbing, cyanosis or edema.  LABORATORY STUDIES:  White cell count is 8.2,  hemoglobin 13.9, hematocrit 41.5, platelet count 167.  IMPRESSION:  Steve [name] is a 79 year old gentleman with multiple problems.  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  We are just following this along.  I have to believe that this is reactive.  He did have a bone marrow biopsy for this.  He does not have a plasma cell neoplasm as of yet.  As far as his DVT goes, this was diagnosed about a year ago.  He has the DVT in his right leg.  I believe that we probably need to keep him on anticoagulation until we see him back and then consider discontinuing this.  I do need to get another PET scan for his mesothelioma.  We will plan to have Steve [name] come back probably in about 2 months or so.  Again, at that point time, I will consider getting him off the Xarelto.    ______________________________ Josph Macho, M.D. PRE/MEDQ  D:  08/01/2012  T:  08/02/2012  Job:  [id number]

## 2012-08-02 NOTE — Patient Instructions (Addendum)
1 week renal panel only  4 weeks     Hypertension As your heart beats, it forces blood through your arteries. This force is your blood pressure. If the pressure is too high, it is called hypertension (HTN) or high blood pressure. HTN is dangerous because you may have it and not know it. High blood pressure may mean that your heart has to work harder to pump blood. Your arteries may be narrow or stiff. The extra work puts you at risk for heart disease, stroke, and other problems.  Blood pressure consists of two numbers, a higher number over a lower, 110/72, for example. It is stated as "110 over 72." The ideal is below 120 for the top number (systolic) and under 80 for the bottom (diastolic). Write down your blood pressure today. You should pay close attention to your blood pressure if you have certain conditions such as:  Heart failure.  Prior heart attack.  Diabetes  Chronic kidney disease.  Prior stroke.  Multiple risk factors for heart disease. To see if you have HTN, your blood pressure should be measured while you are seated with your arm held at the level of the heart. It should be measured at least twice. A one-time elevated blood pressure reading (especially in the Emergency Department) does not mean that you need treatment. There may be conditions in which the blood pressure is different between your right and left arms. It is important to see your caregiver soon for a recheck. Most people have essential hypertension which means that there is not a specific cause. This type of high blood pressure may be lowered by changing lifestyle factors such as:  Stress.  Smoking.  Lack of exercise.  Excessive weight.  Drug/tobacco/alcohol use.  Eating less salt. Most people do not have symptoms from high blood pressure until it has caused damage to the body. Effective treatment can often prevent, delay or reduce that damage. TREATMENT  When a cause has been identified, treatment  for high blood pressure is directed at the cause. There are a large number of medications to treat HTN. These fall into several categories, and your caregiver will help you select the medicines that are best for you. Medications may have side effects. You should review side effects with your caregiver. If your blood pressure stays high after you have made lifestyle changes or started on medicines,   Your medication(s) may need to be changed.  Other problems may need to be addressed.  Be certain you understand your prescriptions, and know how and when to take your medicine.  Be sure to follow up with your caregiver within the time frame advised (usually within two weeks) to have your blood pressure rechecked and to review your medications.  If you are taking more than one medicine to lower your blood pressure, make sure you know how and at what times they should be taken. Taking two medicines at the same time can result in blood pressure that is too low. SEEK IMMEDIATE MEDICAL CARE IF:  You develop a severe headache, blurred or changing vision, or confusion.  You have unusual weakness or numbness, or a faint feeling.  You have severe chest or abdominal pain, vomiting, or breathing problems. MAKE SURE YOU:   Understand these instructions.  Will watch your condition.  Will get help right away if you are not doing well or get worse. Document Released: 12/26/2004 Document Revised: 03/20/2011 Document Reviewed: 08/16/2007 [facility] Patient Information 2014 [place], [place].

## 2012-08-05 ENCOUNTER — Encounter: Payer: Self-pay | Admitting: Family Medicine

## 2012-08-05 LAB — PROTEIN ELECTROPHORESIS, SERUM, WITH REFLEX
Albumin ELP: 46.2 % — ABNORMAL LOW (ref 55.8–66.1)
Alpha-1-Globulin: 6.5 % — ABNORMAL HIGH (ref 2.9–4.9)
Alpha-2-Globulin: 12.2 % — ABNORMAL HIGH (ref 7.1–11.8)
Beta 2: 4.9 % (ref 3.2–6.5)
Beta Globulin: 19.7 % — ABNORMAL HIGH (ref 4.7–7.2)

## 2012-08-05 LAB — IFE INTERPRETATION

## 2012-08-05 LAB — KAPPA/LAMBDA LIGHT CHAINS: Lambda Free Lght Chn: 11 mg/dL — ABNORMAL HIGH (ref 0.57–2.63)

## 2012-08-05 LAB — IGG, IGA, IGM: IgG (Immunoglobin G), Serum: 732 mg/dL (ref 650–1600)

## 2012-08-05 NOTE — Assessment & Plan Note (Signed)
Elevated but improved on recheck, encouraged DASH diet and continue current meds fornow.

## 2012-08-05 NOTE — Progress Notes (Signed)
Patient ID: Steve [name], male   DOB: 1933/04/09, 79 y.o.   MRN: [id number] VITALY WANAT [id number] [birth year] 08/05/2012      Progress Note-Follow Up  Subjective  Chief Complaint  Chief Complaint  Patient presents with  . Follow-up    6 week    HPI  Patient is a 79 year old male in today for followup. She needs to struggle with shortness of breath but does note it is somewhat improved to restart is oriented. Not worsening. No recent illness. No fevers, chills, chest pain. Does not have a sensation of palpitations despite his atrial fibrillation. No GI or GU concerns noted states. Is taking medications as prescribed   Past Medical History  Diagnosis Date  . Prostate enlargement   . GERD (gastroesophageal reflux disease)   . AAA (abdominal aortic aneurysm)     stress test 09/14/10 [facility]  . Anxiety   . Shortness of breath     with exertion   . Recurrent upper respiratory infection (URI)     productive cough- white phlegm- no fever  . Hypertension     chest x ray 12/12 EPIC- repeated 06/06/11, EKG 11/12 [facility]  . DVT (deep venous thrombosis) 08/2011  . Pneumonia   . Bruises easily     takes Xarelto  . Depression   . HTN (hypertension)   . Dysrhythmia     hx of atrial fib post op x 2 days   . Atrial fibrillation   . Mesothelioma   . Arthritis     knee and back  . Sleep apnea      12/12 sleep study,SEVERE per study  Dr Delton Coombes- states doesnt wear machine on regular basis- setting BiPAP 9/9  . Sleep apnea     o2 at night 2 liters  . H/O hiatal hernia   . COPD (chronic obstructive pulmonary disease)     patient uses oxygen 2L/[place] at nite   . Epistaxis 05/13/2012  . Olecranon bursitis of right elbow 06/28/2012    Past Surgical History  Procedure Laterality Date  . Knee surgery  1957    left knee arthotomy  . Abdominal aortic aneurysm repair  11/14/2010    AAA Repair    Dr Arbie Cookey  . Transurethral resection of prostate  01/25/2011    TURP  . Incisional hernia repair  06/12/2011     Procedure: HERNIA REPAIR INCISIONAL;  Surgeon: Velora Heckler, MD;  Location: [facility] ORS;  Service: General;  Laterality: N/A;  Open Repair Ventral Incisional Hernia with Mesh  . Hernia repair    . Video assisted thoracoscopy  11/02/2011    Procedure: VIDEO ASSISTED THORACOSCOPY;  Surgeon: Loreli Slot, MD;  Location: [facility] OR;  Service: Thoracic;  Laterality: Right;  . Pleural biopsy  11/02/2011    Procedure: PLEURAL BIOPSY;  Surgeon: Loreli Slot, MD;  Location: [facility] OR;  Service: Thoracic;  Laterality: Right;  Parietal and visceral biopsies  . Chest tube insertion  11/02/2011    Procedure: INSERTION PLEURAL DRAINAGE CATHETER;  Surgeon: Loreli Slot, MD;  Location: [facility] OR;  Service: Thoracic;  Laterality: Right;  . Decortication  11/02/2011    Procedure: DECORTICATION;  Surgeon: Loreli Slot, MD;  Location[facility] OR;  Service: Thoracic;  Laterality: Right;  . Pleural effusion drainage  11/02/2011    Procedure: DRAINAGE OF PLEURAL EFFUSION;  Surgeon: Loreli Slot, MD;  Location: [facility] OR;  Service: Thoracic;  Laterality: Right;  . Cholecystectomy  01/19/2012    Procedure: LAPAROSCOPIC  CHOLECYSTECTOMY WITH INTRAOPERATIVE CHOLANGIOGRAM;  Surgeon: Liz Malady, MD;  Location: [facility] OR;  Service: General;  Laterality: N/A;  laparoscopic cholecystectomy with intraoperative cholangiogram  . Ercp  01/22/2012    Procedure: ENDOSCOPIC RETROGRADE CHOLANGIOPANCREATOGRAPHY (ERCP);  Surgeon: Louis Meckel, MD;  Location: [facility] OR;  Service: Endoscopy;  Laterality: N/A;  with stent placement  . Ercp  01/21/2012    Procedure: ENDOSCOPIC RETROGRADE CHOLANGIOPANCREATOGRAPHY (ERCP);  Surgeon: Rachael Fee, MD;  Location: [facility] OR;  Service: Gastroenterology;  Laterality: N/A;  . Esophagogastroduodenoscopy  01/24/2012    Procedure: ESOPHAGOGASTRODUODENOSCOPY (EGD);  Surgeon: Louis Meckel, MD;  Location: [facility] ENDOSCOPY;  Service: Endoscopy;  Laterality: N/A;  remove pancr stent   . Endoscopic  retrograde cholangiopancreatography (ercp) with propofol N/A 03/07/2012    Procedure: ENDOSCOPIC RETROGRADE CHOLANGIOPANCREATOGRAPHY (ERCP) WITH PROPOFOL;  Surgeon: Rachael Fee, MD;  Location: [facility] ENDOSCOPY;  Service: Endoscopy;  Laterality: N/A;  remove stent  . Thyroidectomy N/A 04/04/2012    Procedure:  TOTAL THYROIDECTOMY WITH CENTRAL COMPARTMENT LYMPH NODE DISSECTION;  Surgeon: Velora Heckler, MD;  Location: [facility] ORS;  Service: General;  Laterality: N/A;    Family History  Problem Relation Age of Onset  . Arthritis Mother   . Heart failure Mother   . Hypertension Mother   . Diabetes Mother   . Heart failure Daughter     deceased age 68  . Other Daughter     deceased age 52 MVA    History   Social History  . Marital Status: Married    Spouse Name: N/A    Number of Children: 6  . Years of Education: N/A   Occupational History  . retired    Social History Main Topics  . Smoking status: Current Every Day Smoker -- 1.00 packs/day for 60 years    Types: Cigarettes  . Smokeless tobacco: Never Used     Comment: pt using electric cig  . Alcohol Use: No  . Drug Use: No  . Sexually Active: Not Currently   Other Topics Concern  . Not on file   Social History Narrative  . No narrative on file    Current Outpatient Prescriptions on File Prior to Visit  Medication Sig Dispense Refill  . ALPRAZolam (XANAX) 0.25 MG tablet Take 1 tablet (0.25 mg total) by mouth 3 (three) times daily as needed for sleep.  90 tablet  1  . calcium carbonate (OS-CAL - DOSED IN MG OF ELEMENTAL CALCIUM) 1250 MG tablet Take 2 tablets (1,000 mg of elemental calcium total) by mouth 2 (two) times daily.  60 tablet  1  . Cholecalciferol (VITAMIN D3) 1000 UNITS CAPS Take 1 capsule by mouth daily.      Marland Kitchen HYDROcodone-acetaminophen (NORCO/VICODIN) 5-325 MG per tablet Take 1 tablet by mouth as needed.       Marland Kitchen levothyroxine (SYNTHROID, LEVOTHROID) 150 MCG tablet Take 150 mcg by mouth daily before breakfast.       . metoprolol tartrate (LOPRESSOR) 25 MG tablet Take 12.5 mg by mouth 2 (two) times daily.      . Rivaroxaban (XARELTO) 20 MG TABS Take 1 tablet (20 mg total) by mouth daily. Patient takes at night  30 tablet  3  . senna (SENOKOT) 8.6 MG TABS Take 2 tablets by mouth at bedtime.      . sertraline (ZOLOFT) 100 MG tablet Take 1 tablet (100 mg total) by mouth daily before breakfast.  30 tablet  3  . SPIRIVA HANDIHALER 18 MCG inhalation capsule  INHALE ONE CAPSULE ONCE DAILY  30 capsule  0   No current facility-administered medications on file prior to visit.    Allergies  Allergen Reactions  . Lyrica (Pregabalin) Hives and Itching    Review of Systems  Review of Systems  Constitutional: Positive for malaise/fatigue. Negative for fever and weight loss.  HENT: Negative for congestion.   Eyes: Negative for pain and discharge.  Respiratory: Positive for shortness of breath.   Cardiovascular: Negative for chest pain, palpitations and leg swelling.  Gastrointestinal: Negative for nausea, abdominal pain and diarrhea.  Genitourinary: Negative for dysuria.  Musculoskeletal: Negative for falls.  Skin: Negative for rash.  Neurological: Negative for loss of consciousness and headaches.  Endo/Heme/Allergies: Negative for polydipsia.  Psychiatric/Behavioral: Negative for depression and suicidal ideas. The patient is not nervous/anxious and does not have insomnia.     Objective  BP 150/80  Pulse 58  Temp(Src) 98.2 F (36.8 C) (Oral)  Ht 5\' 8"  (1.727 m)  Wt 198 lb 1.9 oz (89.867 kg)  BMI 30.13 kg/m2  SpO2 92%  Physical Exam  Physical Exam  Constitutional: He is oriented to person, place, and time and well-developed, well-nourished, and in no distress. No distress.  HENT:  Head: Normocephalic and atraumatic.  Eyes: Conjunctivae are normal.  Neck: Neck supple. No thyromegaly present.  Cardiovascular: Normal rate and normal heart sounds.   No murmur heard. irregular  Pulmonary/Chest:  Effort normal and breath sounds normal. No respiratory distress.  Abdominal: Soft. Bowel sounds are normal. He exhibits no distension and no mass. There is no tenderness.  Musculoskeletal: He exhibits no edema.  Neurological: He is alert and oriented to person, place, and time.  Skin: Skin is warm.  Psychiatric: Memory, affect and judgment normal.    Lab Results  Component Value Date   TSH 1.05 08/01/2011   Lab Results  Component Value Date   WBC 8.2 08/01/2012   HGB 13.9 08/01/2012   HCT 41.5 08/01/2012   MCV 97 08/01/2012   PLT 167 08/01/2012   Lab Results  Component Value Date   CREATININE 1.24 05/03/2012   BUN 20 05/03/2012   NA 140 05/03/2012   K 4.0 05/03/2012   CL 103 05/03/2012   CO2 27 05/03/2012   Lab Results  Component Value Date   ALT 9 05/03/2012   AST 10 05/03/2012   ALKPHOS 82 05/03/2012   BILITOT 0.5 05/03/2012   Lab Results  Component Value Date   CHOL 184 07/11/2012   Lab Results  Component Value Date   HDL 30.20* 07/11/2012   Lab Results  Component Value Date   LDLCALC 128* 07/11/2012   Lab Results  Component Value Date   TRIG 128.0 07/11/2012   Lab Results  Component Value Date   CHOLHDL 6 07/11/2012     Assessment & Plan  Hypertension Elevated but improved on recheck, encouraged DASH diet and continue current meds fornow.  Thyroid cancer, papillary Follows with oncology and surgery  Atrial fibrillation Rate controlled, tolerating Xarelto, no changes.   COPD (chronic obstructive pulmonary disease) Symptomatically improved with restart of Spiriva.

## 2012-08-05 NOTE — Assessment & Plan Note (Signed)
Symptomatically improved with restart of Spiriva.

## 2012-08-05 NOTE — Assessment & Plan Note (Signed)
Follows with oncology and surgery

## 2012-08-05 NOTE — Assessment & Plan Note (Signed)
Rate controlled, tolerating Xarelto, no changes.

## 2012-08-09 ENCOUNTER — Ambulatory Visit: Admitting: *Deleted

## 2012-08-09 ENCOUNTER — Other Ambulatory Visit: Payer: Self-pay | Admitting: Family Medicine

## 2012-08-09 VITALS — BP 158/88 | HR 53

## 2012-08-09 DIAGNOSIS — I10 Essential (primary) hypertension: Secondary | ICD-10-CM

## 2012-08-09 MED ORDER — LOSARTAN POTASSIUM 25 MG PO TABS: 25.0000 mg | ORAL_TABLET | Freq: Every day | ORAL | Status: DC

## 2012-08-09 NOTE — Progress Notes (Signed)
  Subjective:    Patient ID: Steve [name], male    DOB: 1933/02/16, 79 y.o.   MRN: [id number]  HPI    Review of Systems     Objective:   Physical Exam        Assessment & Plan:   Subjective:  Steve [name] is a 79 y.o. male with hypertension. Current Outpatient Prescriptions  Medication Sig Dispense Refill  . ALPRAZolam (XANAX) 0.25 MG tablet Take 1 tablet (0.25 mg total) by mouth 3 (three) times daily as needed for sleep.  90 tablet  1  . calcium carbonate (OS-CAL - DOSED IN MG OF ELEMENTAL CALCIUM) 1250 MG tablet Take 2 tablets (1,000 mg of elemental calcium total) by mouth 2 (two) times daily.  60 tablet  1  . Cholecalciferol (VITAMIN D3) 1000 UNITS CAPS Take 1 capsule by mouth daily.      Marland Kitchen HYDROcodone-acetaminophen (NORCO/VICODIN) 5-325 MG per tablet Take 1 tablet by mouth as needed.       Marland Kitchen levothyroxine (SYNTHROID, LEVOTHROID) 150 MCG tablet Take 150 mcg by mouth daily before breakfast.      . lisinopril (PRINIVIL,ZESTRIL) 10 MG tablet Take 1 tablet (10 mg total) by mouth 2 (two) times daily.  60 tablet  1  . losartan (COZAAR) 25 MG tablet Take 1 tablet (25 mg total) by mouth daily.  30 tablet  2  . metoprolol tartrate (LOPRESSOR) 25 MG tablet Take 12.5 mg by mouth 2 (two) times daily.      . Rivaroxaban (XARELTO) 20 MG TABS Take 1 tablet (20 mg total) by mouth daily. Patient takes at night  30 tablet  3  . senna (SENOKOT) 8.6 MG TABS Take 2 tablets by mouth at bedtime.      . sertraline (ZOLOFT) 100 MG tablet Take 1 tablet (100 mg total) by mouth daily before breakfast.  30 tablet  3  . SPIRIVA HANDIHALER 18 MCG inhalation capsule INHALE ONE CAPSULE ONCE DAILY  30 capsule  0   No current facility-administered medications for this visit.    New concerns: Elevated BP.   BP 158/88  Pulse 53  SpO2 94% Right; BP 169/98 Pulse 62 SpO2 94% Left Appearance alert, well appearing, and in no distress.  Hypertension asymptomatic and needs improvement.   The following  changes are to be made: Per provider, added Losartan 25 mg [1] tablet daily, F/U in [1] wk. Appointment scheduled for Thurs, Aug 15, 2012 at 3:45p/SLS

## 2012-08-15 ENCOUNTER — Ambulatory Visit: Payer: Medicare Other | Admitting: Family Medicine

## 2012-08-22 ENCOUNTER — Ambulatory Visit: Payer: Medicare Other | Admitting: Family Medicine

## 2012-08-26 ENCOUNTER — Ambulatory Visit: Payer: Medicare Other | Admitting: Family Medicine

## 2012-08-29 DIAGNOSIS — Z0279 Encounter for issue of other medical certificate: Secondary | ICD-10-CM

## 2012-09-17 ENCOUNTER — Encounter: Payer: Self-pay | Admitting: Family Medicine

## 2012-09-17 ENCOUNTER — Ambulatory Visit (INDEPENDENT_AMBULATORY_CARE_PROVIDER_SITE_OTHER): Payer: Medicare Other | Admitting: Family Medicine

## 2012-09-17 ENCOUNTER — Telehealth (INDEPENDENT_AMBULATORY_CARE_PROVIDER_SITE_OTHER): Payer: Self-pay

## 2012-09-17 VITALS — BP 122/82 | HR 51 | Temp 98.7°F | Ht 68.0 in | Wt 192.1 lb

## 2012-09-17 DIAGNOSIS — I4891 Unspecified atrial fibrillation: Secondary | ICD-10-CM

## 2012-09-17 DIAGNOSIS — Z23 Encounter for immunization: Secondary | ICD-10-CM

## 2012-09-17 DIAGNOSIS — I10 Essential (primary) hypertension: Secondary | ICD-10-CM

## 2012-09-17 DIAGNOSIS — J449 Chronic obstructive pulmonary disease, unspecified: Secondary | ICD-10-CM

## 2012-09-17 NOTE — Telephone Encounter (Signed)
Request for hydrocodone deferred to Dr Danise Edge.

## 2012-09-17 NOTE — Patient Instructions (Addendum)

## 2012-09-20 ENCOUNTER — Other Ambulatory Visit: Payer: Self-pay | Admitting: *Deleted

## 2012-09-20 NOTE — Telephone Encounter (Signed)
Faxed refill request received from pharmacy for Hydrocodone-APAP 5-325 mg Last filled by MD - NEVER Filled By PCP, last px by [facility] 06.07.2013 after hernia surgery, then by Pulmonology 06.18.13 after compression Fx  DENIED per protocol; Patient Needs Appointment with PCP before Prescribing Controlled Rx/SLS

## 2012-09-21 ENCOUNTER — Encounter: Payer: Self-pay | Admitting: Family Medicine

## 2012-09-21 NOTE — Progress Notes (Signed)
Patient ID: Steve [name], male   DOB: 1933/11/03, 79 y.o.   MRN: [id number] Steve [name] [id number] 29-Jul-1933 09/21/2012      Progress Note-Follow Up  Subjective  Chief Complaint  Chief Complaint  Patient presents with  . Follow-up    4 week    HPI  Patient is a 79 year old Caucasian male is in today in followup. He is doing better. He denies any respiratory flares since his last visit. No significant cough. No chest pain, palpitations, fevers, increased congestion, GI or GU concerns. Taking medications as prescribed.  Past Medical History  Diagnosis Date  . Prostate enlargement   . GERD (gastroesophageal reflux disease)   . AAA (abdominal aortic aneurysm)     stress test 09/14/10 [facility]  . Anxiety   . Shortness of breath     with exertion   . Recurrent upper respiratory infection (URI)     productive cough- white phlegm- no fever  . Hypertension     chest x ray 12/12 EPIC- repeated 06/06/11, EKG 11/12 [facility]  . DVT (deep venous thrombosis) 08/2011  . Pneumonia   . Bruises easily     takes Xarelto  . Depression   . HTN (hypertension)   . Dysrhythmia     hx of atrial fib post op x 2 days   . Atrial fibrillation   . Mesothelioma   . Arthritis     knee and back  . Sleep apnea      12/12 sleep study,SEVERE per study  Dr Delton Coombes- states doesnt wear machine on regular basis- setting BiPAP 9/9  . Sleep apnea     o2 at night 2 liters  . H/O hiatal hernia   . COPD (chronic obstructive pulmonary disease)     patient uses oxygen 2L/[place] at nite   . Epistaxis 05/13/2012  . Olecranon bursitis of right elbow 06/28/2012    Past Surgical History  Procedure Laterality Date  . Knee surgery  1957    left knee arthotomy  . Abdominal aortic aneurysm repair  11/14/2010    AAA Repair    Dr Arbie Cookey  . Transurethral resection of prostate  01/25/2011    TURP  . Incisional hernia repair  06/12/2011    Procedure: HERNIA REPAIR INCISIONAL;  Surgeon: Velora Heckler, MD;  Location: [facility] ORS;  Service:  General;  Laterality: N/A;  Open Repair Ventral Incisional Hernia with Mesh  . Hernia repair    . Video assisted thoracoscopy  11/02/2011    Procedure: VIDEO ASSISTED THORACOSCOPY;  Surgeon: Loreli Slot, MD;  Location: [facility] OR;  Service: Thoracic;  Laterality: Right;  . Pleural biopsy  11/02/2011    Procedure: PLEURAL BIOPSY;  Surgeon: Loreli Slot, MD;  Location: [facility] OR;  Service: Thoracic;  Laterality: Right;  Parietal and visceral biopsies  . Chest tube insertion  11/02/2011    Procedure: INSERTION PLEURAL DRAINAGE CATHETER;  Surgeon: Loreli Slot, MD;  Location: [facility] OR;  Service: Thoracic;  Laterality: Right;  . Decortication  11/02/2011    Procedure: DECORTICATION;  Surgeon: Loreli Slot, MD;  Location: [facility] OR;  Service: Thoracic;  Laterality: Right;  . Pleural effusion drainage  11/02/2011    Procedure: DRAINAGE OF PLEURAL EFFUSION;  Surgeon: Loreli Slot, MD;  Location: [facility] OR;  Service: Thoracic;  Laterality: Right;  . Cholecystectomy  01/19/2012    Procedure: LAPAROSCOPIC CHOLECYSTECTOMY WITH INTRAOPERATIVE CHOLANGIOGRAM;  Surgeon: Liz Malady, MD;  Location: [facility] OR;  Service: General;  Laterality: N/A;  laparoscopic cholecystectomy with intraoperative cholangiogram  . Ercp  01/22/2012    Procedure: ENDOSCOPIC RETROGRADE CHOLANGIOPANCREATOGRAPHY (ERCP);  Surgeon: Louis Meckel, MD;  Location: [facility] OR;  Service: Endoscopy;  Laterality: N/A;  with stent placement  . Ercp  01/21/2012    Procedure: ENDOSCOPIC RETROGRADE CHOLANGIOPANCREATOGRAPHY (ERCP);  Surgeon: Rachael Fee, MD;  Location: [facility] OR;  Service: Gastroenterology;  Laterality: N/A;  . Esophagogastroduodenoscopy  01/24/2012    Procedure: ESOPHAGOGASTRODUODENOSCOPY (EGD);  Surgeon: Louis Meckel, MD;  Location: [facility] ENDOSCOPY;  Service: Endoscopy;  Laterality: N/A;  remove pancr stent   . Endoscopic retrograde cholangiopancreatography (ercp) with propofol N/A 03/07/2012    Procedure: ENDOSCOPIC  RETROGRADE CHOLANGIOPANCREATOGRAPHY (ERCP) WITH PROPOFOL;  Surgeon: Rachael Fee, MD;  Location: [facility] ENDOSCOPY;  Service: Endoscopy;  Laterality: N/A;  remove stent  . Thyroidectomy N/A 04/04/2012    Procedure:  TOTAL THYROIDECTOMY WITH CENTRAL COMPARTMENT LYMPH NODE DISSECTION;  Surgeon: Velora Heckler, MD;  Location: [facility] OR[facility];  Service: General;  Laterality: N/A;    Family History  Problem Relation Age of Onset  . Arthritis Mother   . Heart failure Mother   . Hypertension Mother   . Diabetes Mother   . Heart failure Daughter     deceased age 56  . Other Daughter     deceased age 46 MVA    History   Social History  . Marital Status: Married    Spouse Name: N/A    Number of Children: 6  . Years of Education: N/A   Occupational History  . retired    Social History Main Topics  . Smoking status: Current Every Day Smoker -- 1.00 packs/day for 60 years    Types: Cigarettes  . Smokeless tobacco: Never Used     Comment: pt using electric cig  . Alcohol Use: No  . Drug Use: No  . Sexual Activity: Not Currently   Other Topics Concern  . Not on file   Social History Narrative  . No narrative on file    Current Outpatient Prescriptions on File Prior to Visit  Medication Sig Dispense Refill  . ALPRAZolam (XANAX) 0.25 MG tablet Take 1 tablet (0.25 mg total) by mouth 3 (three) times daily as needed for sleep.  90 tablet  1  . calcium carbonate (OS-CAL - DOSED IN MG OF ELEMENTAL CALCIUM) 1250 MG tablet Take 2 tablets (1,000 mg of elemental calcium total) by mouth 2 (two) times daily.  60 tablet  1  . Cholecalciferol (VITAMIN D3) 1000 UNITS CAPS Take 1 capsule by mouth daily.      Marland Kitchen HYDROcodone-acetaminophen (NORCO/VICODIN) 5-325 MG per tablet Take 1 tablet by mouth as needed.       Marland Kitchen levothyroxine (SYNTHROID, LEVOTHROID) 150 MCG tablet Take 150 mcg by mouth daily before breakfast.      . lisinopril (PRINIVIL,ZESTRIL) 10 MG tablet Take 1 tablet (10 mg total) by mouth 2 (two) times  daily.  60 tablet  1  . losartan (COZAAR) 25 MG tablet Take 1 tablet (25 mg total) by mouth daily.  30 tablet  2  . metoprolol tartrate (LOPRESSOR) 25 MG tablet Take 12.5 mg by mouth 2 (two) times daily.      . Rivaroxaban (XARELTO) 20 MG TABS Take 1 tablet (20 mg total) by mouth daily. Patient takes at night  30 tablet  3  . senna (SENOKOT) 8.6 MG TABS Take 2 tablets by mouth at bedtime.      . sertraline (ZOLOFT) 100 MG tablet  Take 1 tablet (100 mg total) by mouth daily before breakfast.  30 tablet  3  . SPIRIVA HANDIHALER 18 MCG inhalation capsule INHALE ONE CAPSULE ONCE DAILY  30 capsule  0   No current facility-administered medications on file prior to visit.    Allergies  Allergen Reactions  . Lyrica [Pregabalin] Hives and Itching    Review of Systems  Review of Systems  Constitutional: Negative for fever and malaise/fatigue.  HENT: Negative for congestion.   Eyes: Negative for discharge.  Respiratory: Positive for shortness of breath.   Cardiovascular: Negative for chest pain, palpitations and leg swelling.  Gastrointestinal: Negative for nausea, abdominal pain and diarrhea.  Genitourinary: Negative for dysuria.  Musculoskeletal: Negative for falls.  Skin: Negative for rash.  Neurological: Negative for loss of consciousness and headaches.  Endo/Heme/Allergies: Negative for polydipsia.  Psychiatric/Behavioral: Negative for depression and suicidal ideas. The patient is not nervous/anxious and does not have insomnia.     Objective  BP 122/82  Pulse 51  Temp(Src) 98.7 F (37.1 C) (Oral)  Ht 5\' 8"  (1.727 m)  Wt 192 lb 1.3 oz (87.127 kg)  BMI 29.21 kg/m2  SpO2 91%  Physical Exam  Physical Exam  Constitutional: He is oriented to person, place, and time and well-developed, well-nourished, and in no distress. No distress.  HENT:  Head: Normocephalic and atraumatic.  Eyes: Conjunctivae are normal.  Neck: Neck supple. No thyromegaly present.  Cardiovascular: Normal  heart sounds.   bradycardia  Pulmonary/Chest: Effort normal and breath sounds normal. No respiratory distress.  Abdominal: He exhibits no distension and no mass. There is no tenderness.  Musculoskeletal: He exhibits no edema.  Neurological: He is alert and oriented to person, place, and time.  Skin: Skin is warm.  Psychiatric: Memory, affect and judgment normal.    Lab Results  Component Value Date   TSH 1.05 08/01/2011   Lab Results  Component Value Date   WBC 8.2 08/01/2012   HGB 13.9 08/01/2012   HCT 41.5 08/01/2012   MCV 97 08/01/2012   PLT 167 08/01/2012   Lab Results  Component Value Date   CREATININE 1.24 05/03/2012   BUN 20 05/03/2012   NA 140 05/03/2012   K 4.0 05/03/2012   CL 103 05/03/2012   CO2 27 05/03/2012   Lab Results  Component Value Date   ALT 9 05/03/2012   AST 10 05/03/2012   ALKPHOS 82 05/03/2012   BILITOT 0.5 05/03/2012   Lab Results  Component Value Date   CHOL 184 07/11/2012   Lab Results  Component Value Date   HDL 30.20* 07/11/2012   Lab Results  Component Value Date   LDLCALC 128* 07/11/2012   Lab Results  Component Value Date   TRIG 128.0 07/11/2012   Lab Results  Component Value Date   CHOLHDL 6 07/11/2012     Assessment & Plan  Hypertension Well controlled, no changes to meds.  Atrial fibrillation Rate controlled, tolerating Xarelto  COPD (chronic obstructive pulmonary disease) No exacerbation since last visit. Flu shot given today

## 2012-09-21 NOTE — Assessment & Plan Note (Signed)
Well controlled, no changes to meds 

## 2012-09-21 NOTE — Assessment & Plan Note (Addendum)
No exacerbation since last visit. Flu shot given today

## 2012-09-21 NOTE — Assessment & Plan Note (Signed)
Rate controlled, tolerating Xarelto 

## 2012-09-23 ENCOUNTER — Encounter (HOSPITAL_COMMUNITY)
Admission: RE | Admit: 2012-09-23 | Discharge: 2012-09-23 | Disposition: A | Discharge status: Home / Self Care | Payer: Medicare Other | Source: Ambulatory Visit | Attending: Hematology & Oncology | Admitting: Hematology & Oncology

## 2012-09-23 DIAGNOSIS — C384 Malignant neoplasm of pleura: Secondary | ICD-10-CM | POA: Insufficient documentation

## 2012-09-23 DIAGNOSIS — C450 Mesothelioma of pleura: Secondary | ICD-10-CM

## 2012-09-23 MED ORDER — FLUDEOXYGLUCOSE F - 18 (FDG) INJECTION
17.0000 | Freq: Once | INTRAVENOUS | Status: AC | PRN
  Administered 2012-09-23: 17 via INTRAVENOUS

## 2012-10-03 ENCOUNTER — Ambulatory Visit (HOSPITAL_BASED_OUTPATIENT_CLINIC_OR_DEPARTMENT_OTHER): Payer: Medicare Other | Admitting: Lab

## 2012-10-03 ENCOUNTER — Ambulatory Visit (HOSPITAL_BASED_OUTPATIENT_CLINIC_OR_DEPARTMENT_OTHER): Payer: Medicare Other | Admitting: Hematology & Oncology

## 2012-10-03 VITALS — BP 146/59 | HR 70 | Temp 97.6°F | Resp 18 | Ht 68.0 in | Wt 194.0 lb

## 2012-10-03 DIAGNOSIS — C450 Mesothelioma of pleura: Secondary | ICD-10-CM

## 2012-10-03 DIAGNOSIS — C384 Malignant neoplasm of pleura: Secondary | ICD-10-CM

## 2012-10-03 DIAGNOSIS — M5416 Radiculopathy, lumbar region: Secondary | ICD-10-CM

## 2012-10-03 DIAGNOSIS — D472 Monoclonal gammopathy: Secondary | ICD-10-CM

## 2012-10-03 DIAGNOSIS — IMO0002 Reserved for concepts with insufficient information to code with codable children: Secondary | ICD-10-CM

## 2012-10-03 LAB — CBC WITH DIFFERENTIAL (CANCER CENTER ONLY)
BASO%: 0.1 % (ref 0.0–2.0)
EOS%: 1.4 % (ref 0.0–7.0)
HCT: 43.2 % (ref 38.7–49.9)
LYMPH%: 17.5 % (ref 14.0–48.0)
MCHC: 33.3 g/dL (ref 32.0–35.9)
MCV: 97 fL (ref 82–98)
MONO%: 5 % (ref 0.0–13.0)
NEUT%: 76 % (ref 40.0–80.0)
Platelets: 132 10*3/uL — ABNORMAL LOW (ref 145–400)
RDW: 13.8 % (ref 11.1–15.7)
WBC: 7.3 10*3/uL (ref 4.0–10.0)

## 2012-10-03 MED ORDER — TRAMADOL HCL 50 MG PO TABS: 50.0000 mg | ORAL_TABLET | Freq: Four times a day (QID) | ORAL | Status: DC | PRN

## 2012-10-03 NOTE — Progress Notes (Signed)
This office note has been dictated.

## 2012-10-04 ENCOUNTER — Telehealth: Payer: Self-pay | Admitting: Hematology & Oncology

## 2012-10-04 NOTE — Telephone Encounter (Signed)
Pt aware of 11-21 PET to be NPO 6 hrs and he is aware of 11-28 MD appointment

## 2012-10-07 ENCOUNTER — Telehealth: Payer: Self-pay | Admitting: Family Medicine

## 2012-10-07 DIAGNOSIS — I10 Essential (primary) hypertension: Secondary | ICD-10-CM

## 2012-10-07 LAB — PROTEIN ELECTROPHORESIS, SERUM, WITH REFLEX
Albumin ELP: 46.8 % — ABNORMAL LOW (ref 55.8–66.1)
Alpha-1-Globulin: 7.5 % — ABNORMAL HIGH (ref 2.9–4.9)
Alpha-2-Globulin: 11.7 % (ref 7.1–11.8)
Beta 2: 18.8 % — ABNORMAL HIGH (ref 3.2–6.5)
Beta Globulin: 5.2 % (ref 4.7–7.2)
Total Protein, Serum Electrophoresis: 7 g/dL (ref 6.0–8.3)

## 2012-10-07 LAB — KAPPA/LAMBDA LIGHT CHAINS: Kappa free light chain: 2.06 mg/dL — ABNORMAL HIGH (ref 0.33–1.94)

## 2012-10-07 LAB — IGG, IGA, IGM: IgG (Immunoglobin G), Serum: 675 mg/dL (ref 650–1600)

## 2012-10-07 NOTE — Telephone Encounter (Signed)
Refill- lisinopril 10mg  tab. Take one tablet by mouth twice daily. Qty 60 last fill 8.31.14

## 2012-10-08 MED ORDER — LISINOPRIL 10 MG PO TABS: 10.0000 mg | ORAL_TABLET | Freq: Two times a day (BID) | ORAL | Status: DC

## 2012-10-08 NOTE — Progress Notes (Signed)
DIAGNOSES: 1. Mesothelioma of the right lung. 2. 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 3. Deep venous thrombosis of the right leg. 4. Heterozygote for the factor V Leiden mutation. 5. Lupus anticoagulant. 6. 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  CURRENT THERAPY:  Xarelto 20 mg p.o. q. day.  INTERIM HISTORY:  Steve [name] comes in for followup.  We last saw back in July.  Since then, his wife actually fell in our building about a month or so ago.  She fractured her right arm.  She fractured her right pelvis.  She really took a bad fall.  He has been trying to help with her, as well as her family has been helping.  We did his monoclonal studies back in July.  His monoclonal spike was stable at 1.04 g/dL.  His IgA level was 1180 mg/dL.  Kappa light chain was 11 mg/dL.  He is having no problems with the Xarelto.  There is no bleeding.  His legs have been feeling okay.  We did do a PET scan on him.  This unfortunately showed some progression of the mesothelioma.  The PET scan shows a plaque along the medial pleura measuring 4.7 x 0.7 cm.  This has never been mentioned on a past PET scan, but now it has been shown to "progress."  He does have a pleural nodule at the right apex measuring 1.5 x 1.4 cm.  I also noticed some worsening nodular pleural thickening.  He has no cough or shortness of breath.  He still smokes about a pack and half a day.  PHYSICAL EXAMINATION:  General:  This is an elderly, but fairly well- nourished white gentleman in no obvious distress.  Vital Signs: Temperature of 97.6, pulse 70, respiratory rate 18, blood pressure 146/59.  Weight is 194.  Head/Neck:  Exam shows a normocephalic, atraumatic skull.  There are no ocular or oral lesions.  There are no palpable cervical or supraclavicular lymph nodes.  Lungs:  Clear to percussion and auscultation bilaterally.  Cardiac:  Regular rate  and rhythm with a normal S1 and S2.  There are no murmurs, rubs or bruits. Abdomen:  Soft.  He has good bowel sounds.  There is no fluid wave. There is no palpable hepatosplenomegaly.  Back:  No tenderness over the spine, ribs or hips.  Extremities:  Show no clubbing, cyanosis or edema. No venous cord is noted in his legs.  LABORATORY STUDIES:  White cell count 7.3, hemoglobin 14.4, hematocrit 43.2, platelet count 132.  IMPRESSION:  Steve [name] is a 79 year old gentleman with multiple, multiple problems.  His case is incredibly complicated.  I spent about 40 minutes or so with him and his daughter.  It looks like his mesothelioma is progressing.  As such, we are going to have to see about treatment.  I want to see if radiation can be done. He is 79 years old.  His performance status is ECOG 1-2.  Since the mesothelioma might be localized, he may be a radiation candidate. Unfortunately, there is now __________ plaque about the right atrium. This may preclude him having radiation.  If he cannot have radiation, then we may have to consider chemotherapy with carboplatin/Alimta.  I probably would want to get another PET scan on him in a couple months to see if we __________ find out how aggressive this might be.  The Xarelto is doing well for the deep venous thrombosis.  His papillary carcinoma of  the thyroid is not a problem.  He is on Synthroid.  We will check his monoclonal studies.  Again, Mr. Theiler is incredibly complicated.    ______________________________ Josph Macho, M.D. PRE/MEDQ  D:  10/03/2012  T:  10/08/2012  Job:  [id number]

## 2012-10-09 ENCOUNTER — Other Ambulatory Visit: Payer: Self-pay | Admitting: *Deleted

## 2012-10-09 DIAGNOSIS — C450 Mesothelioma of pleura: Secondary | ICD-10-CM

## 2012-10-09 NOTE — Progress Notes (Signed)
Referral entered for pt to see Dr Roselind Messier for a XRT consult per Dr Myna Hidalgo.

## 2012-10-15 NOTE — Progress Notes (Signed)
Thoracic Location of Tumor / Histology:  Mesothelioma of the right lung  Patient presented with progression on a PET scan done on 09/23/2012.  Biopsies were not done.  Tobacco/Marijuana/Snuff/ETOH use: smokes 1 1/2 packs per day  Past/Anticipated interventions by cardiothoracic surgery, if any: none  Past/Anticipated interventions by medical oncology, if any: If he cannot have radiation, then possible treatment with carboplatin/Alimta per Dr. Myna Hidalgo.  Signs/Symptoms  Weight changes, if any: no  Respiratory complaints, if any: shortness of breath with activity, has a cough and brings up yellowish, white mucus  Hemoptysis, if any: no  Pain issues, if any:  no  SAFETY ISSUES:  Prior radiation? no  Pacemaker/ICD? no  Possible current pregnancy? no  Is the patient on methotrexate? no  Current Complaints / other details:  Steve [name] here with his wife and daugther.  He denies pain.  He states he is unsteady walking and has had frequent falls since 2012.  Denies headaches.

## 2012-10-16 ENCOUNTER — Telehealth: Payer: Self-pay | Admitting: Hematology & Oncology

## 2012-10-16 ENCOUNTER — Ambulatory Visit
Admission: RE | Admit: 2012-10-16 | Discharge: 2012-10-16 | Disposition: A | Discharge status: Home / Self Care | Payer: Medicare Other | Source: Ambulatory Visit | Attending: Radiation Oncology | Admitting: Radiation Oncology

## 2012-10-16 ENCOUNTER — Encounter: Payer: Self-pay | Admitting: Radiation Oncology

## 2012-10-16 VITALS — BP 131/58 | HR 58 | Temp 97.8°F | Ht 68.0 in | Wt 194.8 lb

## 2012-10-16 DIAGNOSIS — I10 Essential (primary) hypertension: Secondary | ICD-10-CM | POA: Insufficient documentation

## 2012-10-16 DIAGNOSIS — C450 Mesothelioma of pleura: Secondary | ICD-10-CM

## 2012-10-16 DIAGNOSIS — C384 Malignant neoplasm of pleura: Secondary | ICD-10-CM | POA: Insufficient documentation

## 2012-10-16 DIAGNOSIS — C73 Malignant neoplasm of thyroid gland: Secondary | ICD-10-CM | POA: Insufficient documentation

## 2012-10-16 NOTE — Progress Notes (Signed)
Please see the Nurse Progress Note in the MD Initial Consult Encounter for this patient. 

## 2012-10-16 NOTE — Progress Notes (Signed)
Radiation Oncology         [phone]) [phone] ________________________________  Initial outpatient Consultation  Name: Steve [name] MRN: [id number]  Date: 10/16/2012  DOB: 1933-07-17  WG:NFAOZ, Misty Stanley, MD  Josph Macho, MD   REFERRING PHYSICIAN: Josph Macho, MD  DIAGNOSIS: The encounter diagnosis was Mesothelioma (pleural).  HISTORY OF PRESENT ILLNESS::Steve [name] is a 79 y.o. male who is seen out of the courtesy of Dr. Arlan Organ for an opinion concerning radiation therapy as part of management of patient's progressive mesothelioma. Last year the patient was noted to have some changes in the right lung field. He eventually underwent biopsy of the right lung pleura with diagnosis revealing malignant mesothelioma. The patient has not required treatment for this issue. He did undergo surgery for papillary thyroid carcinoma earlier this year. On recent imaging however the patient's mesothelioma have progressed throughout parts of the right lung. In light of this issue the patient is now seen in radiation oncology for consideration for treatment.Marland Kitchen  PREVIOUS RADIATION THERAPY: No  PAST MEDICAL HISTORY:  has a past medical history of Prostate enlargement; GERD (gastroesophageal reflux disease); AAA (abdominal aortic aneurysm); Anxiety; Shortness of breath; Recurrent upper respiratory infection (URI); Hypertension; DVT (deep venous thrombosis) (08/2011); Pneumonia; Bruises easily; Depression; HTN (hypertension); Dysrhythmia; Atrial fibrillation; Mesothelioma; Arthritis; Sleep apnea; Sleep apnea; H/O hiatal hernia; COPD (chronic obstructive pulmonary disease); Epistaxis (05/13/2012); and Olecranon bursitis of right elbow (06/28/2012).    PAST SURGICAL HISTORY: Past Surgical History  Procedure Laterality Date  . Knee surgery  1957    left knee arthotomy  . Abdominal aortic aneurysm repair  11/14/2010    AAA Repair    Dr Arbie Cookey  . Transurethral resection of prostate  01/25/2011    TURP  .  Incisional hernia repair  06/12/2011    Procedure: HERNIA REPAIR INCISIONAL;  Surgeon: Velora Heckler, MD;  Location: [facility];  Service: General;  Laterality: N/A;  Open Repair Ventral Incisional Hernia with Mesh  . Hernia repair    . Video assisted thoracoscopy  11/02/2011    Procedure: VIDEO ASSISTED THORACOSCOPY;  Surgeon: Loreli Slot, MD;  Location: [facility] OR;  Service: Thoracic;  Laterality: Right;  . Pleural biopsy  11/02/2011    Procedure: PLEURAL BIOPSY;  Surgeon: Loreli Slot, MD;  Location: [facility] OR;  Service: Thoracic;  Laterality: Right;  Parietal and visceral biopsies  . Chest tube insertion  11/02/2011    Procedure: INSERTION PLEURAL DRAINAGE CATHETER;  Surgeon: Loreli Slot, MD;  Location: [facility] OR;  Service: Thoracic;  Laterality: Right;  . Decortication  11/02/2011    Procedure: DECORTICATION;  Surgeon: Loreli Slot, MD;  Location: [facility] OR;  Service: Thoracic;  Laterality: Right;  . Pleural effusion drainage  11/02/2011    Procedure: DRAINAGE OF PLEURAL EFFUSION;  Surgeon: Loreli Slot, MD;  Location: [facility] OR;  Service: Thoracic;  Laterality: Right;  . Cholecystectomy  01/19/2012    Procedure: LAPAROSCOPIC CHOLECYSTECTOMY WITH INTRAOPERATIVE CHOLANGIOGRAM;  Surgeon: Liz Malady, MD;  Location: [facility] OR;  Service: General;  Laterality: N/A;  laparoscopic cholecystectomy with intraoperative cholangiogram  . Ercp  01/22/2012    Procedure: ENDOSCOPIC RETROGRADE CHOLANGIOPANCREATOGRAPHY (ERCP);  Surgeon: Louis Meckel, MD;  Location: [facility] OR;  Service: Endoscopy;  Laterality: N/A;  with stent placement  . Ercp  01/21/2012    Procedure: ENDOSCOPIC RETROGRADE CHOLANGIOPANCREATOGRAPHY (ERCP);  Surgeon: Rachael Fee, MD;  Location: [facility] OR;  Service: Gastroenterology;  Laterality: N/A;  . Esophagogastroduodenoscopy  01/24/2012  Procedure: ESOPHAGOGASTRODUODENOSCOPY (EGD);  Surgeon: Louis Meckel, MD;  Location: [facility] ENDOSCOPY;  Service: Endoscopy;  Laterality: N/A;   remove pancr stent   . Endoscopic retrograde cholangiopancreatography (ercp) with propofol N/A 03/07/2012    Procedure: ENDOSCOPIC RETROGRADE CHOLANGIOPANCREATOGRAPHY (ERCP) WITH PROPOFOL;  Surgeon: Rachael Fee, MD;  [facility]: [facility];  Service: Endoscopy;  Laterality: N/A;  remove stent  . Thyroidectomy N/A 04/04/2012    Procedure:  TOTAL THYROIDECTOMY WITH CENTRAL COMPARTMENT LYMPH NODE DISSECTION;  Surgeon: Velora Heckler, MD;  Location: [facility];  Service: General;  Laterality: N/A;    FAMILY HISTORY: family history includes Arthritis in his mother; Diabetes in his mother; Heart failure in his daughter and mother; Hypertension in his mother; Other in his daughter.  SOCIAL HISTORY:  reports that he has been smoking Cigarettes.  He has a 90 pack-year smoking history. He has never used smokeless tobacco. He reports that he does not drink alcohol or use illicit drugs.  ALLERGIES: Lyrica  MEDICATIONS:  Current Outpatient Prescriptions  Medication Sig Dispense Refill  . ALPRAZolam (XANAX) 0.25 MG tablet Take 1 tablet (0.25 mg total) by mouth 3 (three) times daily as needed for sleep.  90 tablet  1  . calcium carbonate (OS-CAL - DOSED IN MG OF ELEMENTAL CALCIUM) 1250 MG tablet Take 2 tablets (1,000 mg of elemental calcium total) by mouth 2 (two) times daily.  60 tablet  1  . Cholecalciferol (VITAMIN D3) 1000 UNITS CAPS Take 1 capsule by mouth daily.      Marland Kitchen levothyroxine (SYNTHROID, LEVOTHROID) 150 MCG tablet Take 150 mcg by mouth daily before breakfast.      . lisinopril (PRINIVIL,ZESTRIL) 10 MG tablet Take 1 tablet (10 mg total) by mouth 2 (two) times daily.  60 tablet  3  . losartan (COZAAR) 25 MG tablet Take 1 tablet (25 mg total) by mouth daily.  30 tablet  2  . metoprolol tartrate (LOPRESSOR) 25 MG tablet Take 12.5 mg by mouth 2 (two) times daily.      Marland Kitchen senna (SENOKOT) 8.6 MG TABS Take 2 tablets by mouth at bedtime.      . sertraline (ZOLOFT) 100 MG tablet Take 1 tablet (100 mg total)  by mouth daily before breakfast.  30 tablet  3  . SPIRIVA HANDIHALER 18 MCG inhalation capsule INHALE ONE CAPSULE ONCE DAILY  30 capsule  0  . traMADol (ULTRAM) 50 MG tablet Take 1 tablet (50 mg total) by mouth every 6 (six) hours as needed for pain.  60 tablet  3   No current facility-administered medications for this encounter.    REVIEW OF SYSTEMS:  A 15 point review of systems is documented in the electronic medical record. This was obtained by the nursing staff. However, I reviewed this with the patient to discuss relevant findings and make appropriate changes.  He had some intermittent discomfort along the right lateral lower chest area. He denies any significant cough or hemoptysis. He does have dyspnea with exertion and can only walk 10-15 steps without stopping. He is unable to ambulate a flight of stairs without stopping.  He uses supplemental oxygen at night. He denies any headaches or bony pain.   PHYSICAL EXAM:  height is 5\' 8"  (1.727 m) and weight is 194 lb 12.8 oz (88.361 kg). His temperature is 97.8 F (36.6 C). His blood pressure is 131/58 and his pulse is 58. His oxygen saturation is 95%.   BP 131/58  Pulse 58  Temp(Src) 97.8 F (36.6 C)  Ht 5\' 8"  (1.727 m)  Wt 194 lb 12.8 oz (88.361 kg)  BMI 29.63 kg/m2  SpO2 95%  General Appearance:    Alert, cooperative, no distress, appears stated age, accompanied by wife and daughter on evaluation today   Head:    Normocephalic, without obvious abnormality, atraumatic  Eyes:    PERRL, conjunctiva/corneas clear, EOM's intact,         Ears:    Normal TM's and external ear canals, both ears  Nose:   Nares normal, septum midline, mucosa normal, no drainage    or sinus tenderness  Throat:   Lips, mucosa, and tongue normal; dentures in place, gums normal  Neck:   Supple, symmetrical, trachea midline, no adenopathy;       Thyroid  surgically absent, scar in the lower neck from thyroid surgery:  No enlargement/tenderness/nodules; no  carotid   bruit or JVD  Back:     Symmetric, no curvature, ROM normal, no CVA tenderness  Lungs:     decreased breath sounds in the right lung field area   Chest wall:    No tenderness or deformity  Heart:    Regular rate and rhythm, S1 and S2 normal, no murmur, rub   or gallop  Abdomen:     Soft, non-tender, bowel sounds active all four quadrants,    no masses, no organomegaly, scar from prior aneurysm surgery         Extremities:   Extremities normal, atraumatic, no cyanosis, some edema   Pulses:   2+ and symmetric all extremities  Skin:   Skin color, texture, turgor normal, no rashes or lesions  Lymph nodes:   Cervical, supraclavicular, and axillary nodes normal  Neurologic:    Normal strength      throughout    KPS = 60  100 - Normal; no complaints; no evidence of disease. 90   - Able to carry on normal activity; minor signs or symptoms of disease. 80   - Normal activity with effort; some signs or symptoms of disease. 54   - Cares for self; unable to carry on normal activity or to do active work. 60   - Requires occasional assistance, but is able to care for most of his personal needs. 50   - Requires considerable assistance and frequent medical care. 40   - Disabled; requires special care and assistance. 30   - Severely disabled; hospital admission is indicated although death not imminent. 20   - Very sick; hospital admission necessary; active supportive treatment necessary. 10   - Moribund; fatal processes progressing rapidly. 0     - Dead  Karnofsky DA, Abelmann WH, Craver LS and Burchenal [facility] [phone]) The use of the nitrogen mustards in the palliative treatment of carcinoma: with particular reference to bronchogenic carcinoma Cancer 1 634-56  LABORATORY DATA:  Lab Results  Component Value Date   WBC 7.3 10/03/2012   HGB 14.4 10/03/2012   HCT 43.2 10/03/2012   MCV 97 10/03/2012   PLT 132* 10/03/2012   Lab Results  Component Value Date   NA 140 05/03/2012   K 4.0 05/03/2012    CL 103 05/03/2012   CO2 27 05/03/2012   Lab Results  Component Value Date   ALT 9 05/03/2012   AST 10 05/03/2012   ALKPHOS 82 05/03/2012   BILITOT 0.5 05/03/2012     RADIOGRAPHY: Nm Pet Image Restag (ps) Skull Base To Thigh  09/23/2012   *RADIOLOGY REPORT*  Clinical Data:  Subsequent treatment  strategy for mesothelioma. Previous PET CT demonstrated a hypermetabolic left thyroid nodule which was shown to be papillary thyroid carcinoma by FNA.  The patient underwent a total thyroidectomy on 04/04/2012.  NUCLEAR MEDICINE PET WHOLE BODY  Fasting Blood Glucose:  81  Technique:  17.0 mCi F-18 FDG was injected intravenously. CT data was obtained and used for attenuation correction and anatomic localization only.  (This was not acquired as a diagnostic CT examination.) Additional exam technical data entered on technologist worksheet.  Comparison:  02/19/2012  Findings:  Head/Neck:   No hypermetabolic lymph nodes in the neck.  Chest:  The soft tissue attenuation associate with the pleura of the right hemithorax has increased in thickness in the interval.  A pleural nodule in the anterior right apex has increased in size, measuring 1.5 x 1.4 cm today compared to 1.0 x 1.2 cm previously (see image 64 of series 2).  This nodule is hypermetabolic with SUV max = 6.  The nodular soft tissue pleural thickening posteriorly in the upper right hemithorax has progressed.  A plaque along the medial pleura (adjacent to the right atrium) has progressed, measuring 4.7 x 0.7 cm on today's exam.  This focal pleural thickening is hypermetabolic with SUV max = 5.  Worsening nodular pleural thickening in the posteromedial lower right chest is hypermetabolic ( SUV max = 6) on image 117 of series 2.  Abdomen/Pelvis:  No abnormal hypermetabolic activity within the liver, pancreas, adrenal glands, or spleen.  No hypermetabolic lymph nodes in the abdomen or pelvis.  Small exophytic cysts from the upper pole of the right kidney show no  hypermetabolism.  Diverticular changes are noted in the left colon without diverticulitis.  Bilateral inguinal hernias contain only fat.  A TURP defect is visible in the central prostate gland. The biliary stent noted previously is no longer present.  Skeleton:  No focal hypermetabolic activity to suggest skeletal metastasis.  IMPRESSION:  Progression of pleural disease in the right hemithorax in this patient with known mesothelioma.  There has been interval increase in nodular soft tissue thickening along the pleura of the right chest diffusely.  This abnormal soft tissue is hypermetabolic on PET images.   Original Report Authenticated By: Kennith Center, M.D.      IMPRESSION: Progressive malignant mesothelioma. The patient has progression of disease in several areas along the right pleural cavity. To encompass this with a radiation field would result in significant normal lung being treated. Given the patient's marginal respiratory status at this time, I do not feel the patient would be a good candidate for radiation therapy. I discussed these issues with Dr. Myna Hidalgo and the patient will be evaluated for chemotherapy.  I spent 60 minutes face to face with the patient and more than 50% of that time was spent in counseling and/or coordination of care.   ------------------------------------------------  -----------------------------------  Billie Lade, PhD, MD

## 2012-10-16 NOTE — Telephone Encounter (Signed)
Faxed Medical Records via fax today toAvelino [name]  [facility]: [phone] FX: [phone]   Medical Records requested from ALL to present      CONSENT COPY SCANNED

## 2012-10-23 ENCOUNTER — Other Ambulatory Visit: Payer: Self-pay | Admitting: *Deleted

## 2012-10-23 MED ORDER — SERTRALINE HCL 100 MG PO TABS: 100.0000 mg | ORAL_TABLET | Freq: Every day | ORAL | Status: DC

## 2012-10-23 NOTE — Telephone Encounter (Signed)
Faxed refill request received from pharmacy for Sertraline Last filled by MD on 06.09.14, #[id number] Last AEX - 09.09.14 Next AEX - 4 Wks Refill sent per [facility] refill protocol/SLS

## 2012-10-26 ENCOUNTER — Other Ambulatory Visit: Payer: Self-pay | Admitting: Hematology & Oncology

## 2012-10-28 ENCOUNTER — Telehealth: Payer: Self-pay | Admitting: Hematology & Oncology

## 2012-10-28 NOTE — Telephone Encounter (Signed)
Pt aware of 10-28 appointment

## 2012-10-29 ENCOUNTER — Ambulatory Visit (INDEPENDENT_AMBULATORY_CARE_PROVIDER_SITE_OTHER): Payer: Medicare Other | Admitting: Family Medicine

## 2012-10-29 ENCOUNTER — Encounter: Payer: Self-pay | Admitting: Family Medicine

## 2012-10-29 VITALS — BP 144/72 | HR 55 | Temp 97.8°F | Ht 68.0 in | Wt 191.0 lb

## 2012-10-29 DIAGNOSIS — I10 Essential (primary) hypertension: Secondary | ICD-10-CM

## 2012-10-29 DIAGNOSIS — F17200 Nicotine dependence, unspecified, uncomplicated: Secondary | ICD-10-CM

## 2012-10-29 DIAGNOSIS — I4891 Unspecified atrial fibrillation: Secondary | ICD-10-CM

## 2012-10-29 DIAGNOSIS — C450 Mesothelioma of pleura: Secondary | ICD-10-CM

## 2012-10-29 DIAGNOSIS — C384 Malignant neoplasm of pleura: Secondary | ICD-10-CM

## 2012-10-29 DIAGNOSIS — Z720 Tobacco use: Secondary | ICD-10-CM

## 2012-10-29 MED ORDER — ASPIRIN EC 81 MG PO TBEC: 81.0000 mg | DELAYED_RELEASE_TABLET | Freq: Every day | ORAL | Status: DC

## 2012-10-29 NOTE — Patient Instructions (Signed)

## 2012-11-02 ENCOUNTER — Encounter: Payer: Self-pay | Admitting: Family Medicine

## 2012-11-02 NOTE — Assessment & Plan Note (Signed)
Mild elevation will continue to monitor, encouraged DASH diet.

## 2012-11-02 NOTE — Progress Notes (Signed)
Patient ID: Steve [name], male   DOB: 1933-09-19, 79 y.o.   MRN: [id number] OCTAVIANO MUKAI [id number] October 11, 1933 11/02/2012      Progress Note-Follow Up  Subjective  Chief Complaint  Chief Complaint  Patient presents with  . Follow-up    6 week    HPI  Patient is a 79 year old Caucasian male who is in today with his family. He feels relatively well considering his extensive illness. He is bruising easily with several but otherwise tolerating it. He denies any chest pain or palpitations. His shortness of breath is at its baseline and not at rest. He is in the process of getting ready to start radiation for his mesothelioma. His disease is too bulky for chemotherapy at this time. Denies any significant pain. No GI or GU complaints. Unfortunately he continues to smoke and says he does not have plans to quit.  Past Medical History  Diagnosis Date  . Prostate enlargement   . GERD (gastroesophageal reflux disease)   . AAA (abdominal aortic aneurysm)     stress test 09/14/10 EP[facility]  . Anxiety   . Shortness of breath     with exertion   . Recurrent upper respiratory infection (URI)     productive cough- white phlegm- no fever  . Hypertension     chest x ray 12/12 EPIC- repeated 06/06/11, EKG 11/12 EPIC  . DVT (deep venous thrombosis) 08/2011  . Pneumonia   . Bruises easily     takes Xarelto  . Depression   . HTN (hypertension)   . Dysrhythmia     hx of atrial fib post op x 2 days   . Atrial fibrillation   . Mesothelioma   . Arthritis     knee and back  . Sleep apnea      12/12 sleep study,SEVERE per study  Dr Delton Coombes- states doesnt wear machine on regular basis- setting BiPAP 9/9  . Sleep apnea     o2 at night 2 liters  . H/O hiatal hernia   . COPD (chronic obstructive pulmonary disease)     patient uses oxygen 2L/[place] at nite   . Epistaxis 05/13/2012  . Olecranon bursitis of right elbow 06/28/2012    Past Surgical History  Procedure Laterality Date  . Knee surgery  1957    left  knee arthotomy  . Abdominal aortic aneurysm repair  11/14/2010    AAA Repair    Dr Arbie Cookey  . Transurethral resection of prostate  01/25/2011    TURP  . Incisional hernia repair  06/12/2011    Procedure: HERNIA REPAIR INCISIONAL;  Surgeon: Velora Heckler, MD;  Location: [facility];  Service: General;  Laterality: N/A;  Open Repair Ventral Incisional Hernia with Mesh  . Hernia repair    . Video assisted thoracoscopy  11/02/2011    Procedure: VIDEO ASSISTED THORACOSCOPY;  Surgeon: Loreli Slot, MD;  Location: [facility] OR;  Service: Thoracic;  Laterality: Right;  . Pleural biopsy  11/02/2011    Procedure: PLEURAL BIOPSY;  Surgeon: Loreli Slot, MD;  Location: [facility] OR;  Service: Thoracic;  Laterality: Right;  Parietal and visceral biopsies  . Chest tube insertion  11/02/2011    Procedure: INSERTION PLEURAL DRAINAGE CATHETER;  Surgeon: Loreli Slot, MD;  Location: [facility] OR;  Service: Thoracic;  Laterality: Right;  . Decortication  11/02/2011    Procedure: DECORTICATION;  Surgeon: Loreli Slot, MD;  Location: [facility] OR;  Service: Thoracic;  Laterality: Right;  . Pleural effusion drainage  11/02/2011    Procedure: DRAINAGE OF PLEURAL EFFUSION;  Surgeon: Loreli Slot, MD;  Location: [facility] OR;  Service: Thoracic;  Laterality: Right;  . Cholecystectomy  01/19/2012    Procedure: LAPAROSCOPIC CHOLECYSTECTOMY WITH INTRAOPERATIVE CHOLANGIOGRAM;  Surgeon: Liz Malady, MD;  Location: [facility] OR;  Service: General;  Laterality: N/A;  laparoscopic cholecystectomy with intraoperative cholangiogram  . Ercp  01/22/2012    Procedure: ENDOSCOPIC RETROGRADE CHOLANGIOPANCREATOGRAPHY (ERCP);  Surgeon: Louis Meckel, MD;  Location: [facility] OR;  Service: Endoscopy;  Laterality: N/A;  with stent placement  . Ercp  01/21/2012    Procedure: ENDOSCOPIC RETROGRADE CHOLANGIOPANCREATOGRAPHY (ERCP);  Surgeon: Rachael Fee, MD;  Location: [facility] OR;  Service: Gastroenterology;  Laterality: N/A;  . Esophagogastroduodenoscopy   01/24/2012    Procedure: ESOPHAGOGASTRODUODENOSCOPY (EGD);  Surgeon: Louis Meckel, MD;  Location: [facility]COPY;  Service: Endoscopy;  Laterality: N/A;  remove pancr stent   . Endoscopic retrograde cholangiopancreatography (ercp) with propofol N/A 03/07/2012    Procedure: ENDOSCOPIC RETROGRADE CHOLANGIOPANCREATOGRAPHY (ERCP) WITH PROPOFOL;  Surgeon: Rachael Fee, MD;  Location: [facility];  Service: Endoscopy;  Laterality: N/A;  remove stent  . Thyroidectomy N/A 04/04/2012    Procedure:  TOTAL THYROIDECTOMY WITH CENTRAL COMPARTMENT LYMPH NODE DISSECTION;  Surgeon: Velora Heckler, MD;  Location: [facility];  Service: General;  Laterality: N/A;    Family History  Problem Relation Age of Onset  . Arthritis Mother   . Heart failure Mother   . Hypertension Mother   . Diabetes Mother   . Heart failure Daughter     deceased age 34  . Other Daughter     deceased age 77 MVA    History   Social History  . Marital Status: Married    Spouse Name: N/A    Number of Children: 6  . Years of Education: N/A   Occupational History  . retired    Social History Main Topics  . Smoking status: Current Every Day Smoker -- 1.50 packs/day for 60 years    Types: Cigarettes  . Smokeless tobacco: Never Used     Comment: pt using electric cig  . Alcohol Use: No  . Drug Use: No  . Sexual Activity: Not Currently   Other Topics Concern  . Not on file   Social History Narrative  . No narrative on file    Current Outpatient Prescriptions on File Prior to Visit  Medication Sig Dispense Refill  . ALPRAZolam (XANAX) 0.25 MG tablet Take 1 tablet (0.25 mg total) by mouth 3 (three) times daily as needed for sleep.  90 tablet  1  . calcium carbonate (OS-CAL - DOSED IN MG OF ELEMENTAL CALCIUM) 1250 MG tablet Take 2 tablets (1,000 mg of elemental calcium total) by mouth 2 (two) times daily.  60 tablet  1  . Cholecalciferol (VITAMIN D3) 1000 UNITS CAPS Take 1 capsule by mouth daily.      Marland Kitchen levothyroxine  (SYNTHROID, LEVOTHROID) 150 MCG tablet Take 150 mcg by mouth daily before breakfast.      . lisinopril (PRINIVIL,ZESTRIL) 10 MG tablet Take 1 tablet (10 mg total) by mouth 2 (two) times daily.  60 tablet  3  . losartan (COZAAR) 25 MG tablet Take 1 tablet (25 mg total) by mouth daily.  30 tablet  2  . metoprolol tartrate (LOPRESSOR) 25 MG tablet Take 12.5 mg by mouth 2 (two) times daily.      Marland Kitchen senna (SENOKOT) 8.6 MG TABS Take 2 tablets by mouth at bedtime.      Marland Kitchen  sertraline (ZOLOFT) 100 MG tablet Take 1 tablet (100 mg total) by mouth daily before breakfast.  30 tablet  1  . SPIRIVA HANDIHALER 18 MCG inhalation capsule INHALE ONE CAPSULE ONCE DAILY  30 capsule  0  . traMADol (ULTRAM) 50 MG tablet Take 1 tablet (50 mg total) by mouth every 6 (six) hours as needed for pain.  60 tablet  3   No current facility-administered medications on file prior to visit.    Allergies  Allergen Reactions  . Lyrica [Pregabalin] Hives and Itching    Review of Systems  Review of Systems  Constitutional: Positive for malaise/fatigue. Negative for fever.  HENT: Negative for congestion.   Eyes: Negative for discharge.  Respiratory: Positive for cough. Negative for shortness of breath.   Cardiovascular: Negative for chest pain, palpitations and leg swelling.  Gastrointestinal: Negative for nausea, abdominal pain and diarrhea.  Genitourinary: Negative for dysuria.  Musculoskeletal: Negative for falls.  Skin: Negative for rash.  Neurological: Negative for loss of consciousness and headaches.  Endo/Heme/Allergies: Negative for polydipsia.  Psychiatric/Behavioral: Negative for depression and suicidal ideas. The patient is not nervous/anxious and does not have insomnia.     Objective  BP 144/72  Pulse 55  Temp(Src) 97.8 F (36.6 C) (Oral)  Ht 5\' 8"  (1.727 m)  Wt 191 lb (86.637 kg)  BMI 29.05 kg/m2  SpO2 90%  Physical Exam  Physical Exam  Constitutional: He is oriented to person, place, and time and  well-developed, well-nourished, and in no distress. No distress.  HENT:  Head: Normocephalic and atraumatic.  Eyes: Conjunctivae are normal.  Neck: Neck supple. No thyromegaly present.  Cardiovascular: Normal rate, regular rhythm and normal heart sounds.   No murmur heard. Bradycaridia, regular rhythm   Pulmonary/Chest: Effort normal and breath sounds normal. No respiratory distress. He has no wheezes. He has no rales.  Prolonged expiration  Abdominal: He exhibits no distension and no mass. There is no tenderness.  Musculoskeletal: He exhibits no edema.  Neurological: He is alert and oriented to person, place, and time.  Skin: Skin is warm.  Psychiatric: Memory, affect and judgment normal.    Lab Results  Component Value Date   TSH 1.05 08/01/2011   Lab Results  Component Value Date   WBC 7.3 10/03/2012   HGB 14.4 10/03/2012   HCT 43.2 10/03/2012   MCV 97 10/03/2012   PLT 132* 10/03/2012   Lab Results  Component Value Date   CREATININE 1.24 05/03/2012   BUN 20 05/03/2012   NA 140 05/03/2012   K 4.0 05/03/2012   CL 103 05/03/2012   CO2 27 05/03/2012   Lab Results  Component Value Date   ALT 9 05/03/2012   AST 10 05/03/2012   ALKPHOS 82 05/03/2012   BILITOT 0.5 05/03/2012   Lab Results  Component Value Date   CHOL 184 07/11/2012   Lab Results  Component Value Date   HDL 30.20* 07/11/2012   Lab Results  Component Value Date   LDLCALC 128* 07/11/2012   Lab Results  Component Value Date   TRIG 128.0 07/11/2012   Lab Results  Component Value Date   CHOLHDL 6 07/11/2012     Assessment & Plan  Atrial fibrillation Rate controlled, asymptomatic  Hypertension Mild elevation will continue to monitor, encouraged DASH diet.  Tobacco abuse Unfortunately patient continues to smoke and has not plans to quit.   Mesothelioma (pleural) Presently getting set up for radiation therapy due to size of disease

## 2012-11-02 NOTE — Assessment & Plan Note (Signed)
Unfortunately patient continues to smoke and has not plans to quit.

## 2012-11-02 NOTE — Assessment & Plan Note (Signed)
Presently getting set up for radiation therapy due to size of disease

## 2012-11-02 NOTE — Assessment & Plan Note (Signed)
Rate controlled, asymptomatic 

## 2012-11-05 ENCOUNTER — Other Ambulatory Visit (HOSPITAL_BASED_OUTPATIENT_CLINIC_OR_DEPARTMENT_OTHER): Payer: Medicare Other | Admitting: Lab

## 2012-11-05 ENCOUNTER — Ambulatory Visit (HOSPITAL_BASED_OUTPATIENT_CLINIC_OR_DEPARTMENT_OTHER): Payer: Medicare Other | Admitting: Hematology & Oncology

## 2012-11-05 VITALS — BP 159/62 | HR 51 | Temp 97.5°F | Resp 16 | Ht 68.0 in | Wt 194.0 lb

## 2012-11-05 DIAGNOSIS — D472 Monoclonal gammopathy: Secondary | ICD-10-CM

## 2012-11-05 DIAGNOSIS — D6859 Other primary thrombophilia: Secondary | ICD-10-CM

## 2012-11-05 DIAGNOSIS — C450 Mesothelioma of pleura: Secondary | ICD-10-CM

## 2012-11-05 DIAGNOSIS — C3490 Malignant neoplasm of unspecified part of unspecified bronchus or lung: Secondary | ICD-10-CM

## 2012-11-05 DIAGNOSIS — M5416 Radiculopathy, lumbar region: Secondary | ICD-10-CM

## 2012-11-05 DIAGNOSIS — F17200 Nicotine dependence, unspecified, uncomplicated: Secondary | ICD-10-CM

## 2012-11-05 LAB — CBC WITH DIFFERENTIAL (CANCER CENTER ONLY)
BASO#: 0 10*3/uL (ref 0.0–0.2)
Eosinophils Absolute: 0.2 10*3/uL (ref 0.0–0.5)
HGB: 14.4 g/dL (ref 13.0–17.1)
LYMPH%: 21.9 % (ref 14.0–48.0)
MCV: 98 fL (ref 82–98)
MONO#: 0.4 10*3/uL (ref 0.1–0.9)
Platelets: 135 10*3/uL — ABNORMAL LOW (ref 145–400)
RBC: 4.49 10*6/uL (ref 4.20–5.70)
WBC: 6.9 10*3/uL (ref 4.0–10.0)

## 2012-11-05 NOTE — Progress Notes (Signed)
This office note has been dictated.

## 2012-11-06 NOTE — Progress Notes (Signed)
DIAGNOSES: 1. Mesothelioma of the right lung. 2. Factor V Leiden mutation, heterozygote. 3. Lupus anticoagulant positive. 4. Deep venous thrombosis of the right leg. 5. 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 6. Papillary carcinoma of the thyroid.  CURRENT THERAPY:  Xarelto 20 mg p.o. daily.  INTERIM HISTORY:  Mr. Deems comes in for followup.  We sent him over to [facility].  They did not feel that they could give him radiation safely.  He has significant underlying lung disease.  He still smokes quite a bit.  They felt that the mesothelioma was a little bit too extensive.  He is 79 years old.  His performance status is ECOG 2.  He is asymptomatic with the mesothelioma.  I think that we can probably hold off on chemotherapy right now.  He is due for a PET scan at the end of November.  I think that will really be the key as to whether or not we need to treat him.  As far as his monoclonal gammopathy goes, his last M spike was better at 0.86 g/dL.  IgA level was 1140 mg/dL.  Lambda light chain was 10.2 mg/dL.  He has had no problems with bleeding with the Xarelto.  He has had no cough.  He has had no hemoptysis.  He has had no headache.  PHYSICAL EXAMINATION:  General:  This is an elderly white gentleman in no obvious distress.  Vital signs:  Temperature of 97.5, pulse 51, respiratory rate 16, blood pressure 159/62.  Weight is 194 pounds.  Head and Neck:  Show a normocephalic atraumatic skull.  There are no ocular or oral lesions.  There are no palpable cervical or supraclavicular lymph nodes.  Lungs:  He has some slight decrease in breath sounds over the right lung.  There may be some crackles over the right lung.  The left lung has decent breath sounds.  Cardiac:  Regular rate and rhythm, with a normal S1 and S2.  There are no murmurs, rubs, or bruits. Abdomen:  Soft.  He has good bowel sounds.  There is no fluid wave. There is no  palpable abdominal mass.  There is no palpable hepatosplenomegaly.  Back:  No tenderness over the spine, ribs, or hips. Extremities:  Show no clubbing, cyanosis, or edema.  No venous cord is noted in the right leg.  He has good strength in his arms and legs. Skin:  No rashes, ecchymoses or petechiae.  LABORATORY STUDIES:  White cell count 6.9, hemoglobin 14.4, hematocrit 48.8, platelet count 135.  IMPRESSION:  Mr. Orsak is a 79 year old gentleman with mesothelioma of the right lung.  This is still localized.  Again, he is asymptomatic with this.  I would want to see some more evidence of activity before we start putting him through chemotherapy.  Again, he is due for a PET scan at the end of November.  He will continue on the Xarelto for the foreseeable future.  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  I spent a good 45 minutes with him and his wife and daughter.  I answered all their questions.  I went over my recommendations.  They are in total agreement.  I will plan to see Mr. Gilliand back the first week in December.    ______________________________ Josph Macho, M.D. PRE/MEDQ  D:  11/05/2012  T:  11/06/2012  Job:  [id number]

## 2012-11-07 LAB — COMPREHENSIVE METABOLIC PANEL
Albumin: 3.5 g/dL (ref 3.5–5.2)
BUN: 19 mg/dL (ref 6–23)
CO2: 26 mEq/L (ref 19–32)
Glucose, Bld: 92 mg/dL (ref 70–99)
Potassium: 3.9 mEq/L (ref 3.5–5.3)
Sodium: 140 mEq/L (ref 135–145)
Total Bilirubin: 0.3 mg/dL (ref 0.3–1.2)
Total Protein: 6.8 g/dL (ref 6.0–8.3)

## 2012-11-07 LAB — KAPPA/LAMBDA LIGHT CHAINS
Kappa:Lambda Ratio: 0.19 — ABNORMAL LOW (ref 0.26–1.65)
Lambda Free Lght Chn: 10 mg/dL — ABNORMAL HIGH (ref 0.57–2.63)

## 2012-11-07 LAB — PROTEIN ELECTROPHORESIS, SERUM, WITH REFLEX
Albumin ELP: 49 % — ABNORMAL LOW (ref 55.8–66.1)
Alpha-1-Globulin: 3.4 % (ref 2.9–4.9)
Total Protein, Serum Electrophoresis: 6.8 g/dL (ref 6.0–8.3)

## 2012-11-15 ENCOUNTER — Other Ambulatory Visit: Payer: Self-pay | Admitting: Family Medicine

## 2012-11-21 ENCOUNTER — Telehealth (HOSPITAL_COMMUNITY): Payer: Self-pay | Admitting: Radiology

## 2012-11-21 NOTE — Telephone Encounter (Signed)
Called patient to reschedule PET appointment to 11/25 at 11:00.  Arrive at 10:30am, no food/sugar 6 hrs prior to appointment, patient denies using insulin.  Patient has requested to be added to the wait list for any earlier appointment.  Steve Meo, RN for Dr. Myna Hidalgo aware of PET appointment change.

## 2012-11-26 ENCOUNTER — Telehealth: Payer: Self-pay | Admitting: Hematology & Oncology

## 2012-11-26 NOTE — Telephone Encounter (Signed)
Faxed Medical Records via fax today to:  Dianna Rossetti  [facility]: [phone] or [phone]  F: [phone]    Medical Records requested from 07/25/2012 to present     CONSENT COPY SCANNED

## 2012-11-27 ENCOUNTER — Ambulatory Visit (INDEPENDENT_AMBULATORY_CARE_PROVIDER_SITE_OTHER): Payer: Medicare Other | Admitting: Cardiology

## 2012-11-27 ENCOUNTER — Encounter: Payer: Self-pay | Admitting: Cardiology

## 2012-11-27 VITALS — BP 156/88 | HR 50 | Ht 68.0 in | Wt 195.0 lb

## 2012-11-27 DIAGNOSIS — I10 Essential (primary) hypertension: Secondary | ICD-10-CM

## 2012-11-27 DIAGNOSIS — F17200 Nicotine dependence, unspecified, uncomplicated: Secondary | ICD-10-CM

## 2012-11-27 DIAGNOSIS — I4891 Unspecified atrial fibrillation: Secondary | ICD-10-CM

## 2012-11-27 DIAGNOSIS — Z720 Tobacco use: Secondary | ICD-10-CM

## 2012-11-27 NOTE — Progress Notes (Signed)
HPI: 79 year old male for evaluation of atrial fibrillation. Nuclear study in September 2012 showed an ejection fraction of 62%, diaphragmatic attenuation but no ischemia. Patient had an episode of atrial fibrillation following surgical repair of an abdominal aortic aneurysm in 2012. He converted spontaneously to sinus rhythm. Echocardiogram in June of 2013 showed normal LV function,grade 1 diastolic dysfunction, mild left atrial enlargement and trace aortic insufficiency. Patient has apparently not had any recurrent atrial fibrillation by his wife's report. He does have dyspnea on exertion from COPD and also has a mesothelioma. He denies chest pain, palpitations or syncope.  Current Outpatient Prescriptions  Medication Sig Dispense Refill  . ALPRAZolam (XANAX) 0.25 MG tablet Take 0.25 mg by mouth daily.      Marland Kitchen aspirin EC 81 MG tablet Take 1 tablet (81 mg total) by mouth daily.      . calcium carbonate (OS-CAL - DOSED IN MG OF ELEMENTAL CALCIUM) 1250 MG tablet Take 2 tablets (1,000 mg of elemental calcium total) by mouth 2 (two) times daily.  60 tablet  1  . Cholecalciferol (VITAMIN D3) 1000 UNITS CAPS Take 1 capsule by mouth daily.      Marland Kitchen levothyroxine (SYNTHROID, LEVOTHROID) 150 MCG tablet Take 150 mcg by mouth daily before breakfast.      . lisinopril (PRINIVIL,ZESTRIL) 10 MG tablet Take 1 tablet (10 mg total) by mouth 2 (two) times daily.  60 tablet  3  . losartan (COZAAR) 25 MG tablet TAKE ONE TABLET BY MOUTH ONCE DAILY  30 tablet  0  . metoprolol tartrate (LOPRESSOR) 25 MG tablet Take 12.5 mg by mouth 2 (two) times daily.      Marland Kitchen senna (SENOKOT) 8.6 MG TABS Take 2 tablets by mouth at bedtime.      . sertraline (ZOLOFT) 100 MG tablet Take 1 tablet (100 mg total) by mouth daily before breakfast.  30 tablet  1  . SPIRIVA HANDIHALER 18 MCG inhalation capsule INHALE ONE CAPSULE ONCE DAILY  30 capsule  0  . traMADol (ULTRAM) 50 MG tablet Take 1 tablet (50 mg total) by mouth every 6 (six) hours  as needed for pain.  60 tablet  3   No current facility-administered medications for this visit.    Allergies  Allergen Reactions  . Lyrica [Pregabalin] Hives and Itching    Past Medical History  Diagnosis Date  . Prostate enlargement   . GERD (gastroesophageal reflux disease)   . AAA (abdominal aortic aneurysm)     stress test 09/14/10 [facility]  . Anxiety   . Recurrent upper respiratory infection (URI)     productive cough- white phlegm- no fever  . Hypertension     chest x ray 12/12 EPIC- repeated 06/06/11, EKG 11/12 EP[facility]  . DVT (deep venous thrombosis) 08/2011  . Pneumonia   . Bruises easily     takes Xarelto  . Depression   . Dysrhythmia     hx of atrial fib post op x 2 days   . Atrial fibrillation   . Mesothelioma   . Arthritis     knee and back  . Sleep apnea      12/12 sleep study,SEVERE per study  Dr Delton Coombes- states doesnt wear machine on regular basis- setting BiPAP 9/9  . H/O hiatal hernia   . COPD (chronic obstructive pulmonary disease)     patient uses oxygen 2L/[place] at nite   . Epistaxis 05/13/2012  . Olecranon bursitis of right elbow 06/28/2012    Past Surgical History  Procedure Laterality Date  . Knee surgery  1957    left knee arthotomy  . Abdominal aortic aneurysm repair  11/14/2010    AAA Repair    Dr Arbie Cookey  . Transurethral resection of prostate  01/25/2011    TURP  . Incisional hernia repair  06/12/2011    Procedure: HERNIA REPAIR INCISIONAL;  Surgeon: Velora Heckler, MD;  Location: [facility] ORS;  Service: General;  Laterality: N/A;  Open Repair Ventral Incisional Hernia with Mesh  . Hernia repair    . Video assisted thoracoscopy  11/02/2011    Procedure: VIDEO ASSISTED THORACOSCOPY;  Surgeon: Loreli Slot, MD;  Location: [facility] OR;  Service: Thoracic;  Laterality: Right;  . Pleural biopsy  11/02/2011    Procedure: PLEURAL BIOPSY;  Surgeon: Loreli Slot, MD;  Location: [facility] OR;  Service: Thoracic;  Laterality: Right;  Parietal and visceral biopsies  . Chest  tube insertion  11/02/2011    Procedure: INSERTION PLEURAL DRAINAGE CATHETER;  Surgeon: Loreli Slot, MD;  Location: [facility] OR;  Service: Thoracic;  Laterality: Right;  . Decortication  11/02/2011    Procedure: DECORTICATION;  Surgeon: Loreli Slot, MD;  Location: [facility] OR;  Service: Thoracic;  Laterality: Right;  . Pleural effusion drainage  11/02/2011    Procedure: DRAINAGE OF PLEURAL EFFUSION;  Surgeon: Loreli Slot, MD;  Location: [facility] OR;  Service: Thoracic;  Laterality: Right;  . Cholecystectomy  01/19/2012    Procedure: LAPAROSCOPIC CHOLECYSTECTOMY WITH INTRAOPERATIVE CHOLANGIOGRAM;  Surgeon: Liz Malady, MD;  Location: [facility] OR;  Service: General;  Laterality: N/A;  laparoscopic cholecystectomy with intraoperative cholangiogram  . Ercp  01/22/2012    Procedure: ENDOSCOPIC RETROGRADE CHOLANGIOPANCREATOGRAPHY (ERCP);  Surgeon: Louis Meckel, MD;  Location: [facility] OR;  Service: Endoscopy;  Laterality: N/A;  with stent placement  . Ercp  01/21/2012    Procedure: ENDOSCOPIC RETROGRADE CHOLANGIOPANCREATOGRAPHY (ERCP);  Surgeon: Rachael Fee, MD;  Location: [facility] OR;  Service: Gastroenterology;  Laterality: N/A;  . Esophagogastroduodenoscopy  01/24/2012    Procedure: ESOPHAGOGASTRODUODENOSCOPY (EGD);  Surgeon: Louis Meckel, MD;  Location: [facility] ENDOSCOPY;  Service: Endoscopy;  Laterality: N/A;  remove pancr stent   . Endoscopic retrograde cholangiopancreatography (ercp) with propofol N/A 03/07/2012    Procedure: ENDOSCOPIC RETROGRADE CHOLANGIOPANCREATOGRAPHY (ERCP) WITH PROPOFOL;  Surgeon: Rachael Fee, MD;  Location: [facility] ENDOSCOPY;  Service: Endoscopy;  Laterality: N/A;  remove stent  . Thyroidectomy N/A 04/04/2012    Procedure:  TOTAL THYROIDECTOMY WITH CENTRAL COMPARTMENT LYMPH NODE DISSECTION;  Surgeon: Velora Heckler, MD;  Location: [facility] ORS;  Service: General;  Laterality: N/A;    History   Social History  . Marital Status: Married    Spouse Name: N/A    Number of Children: 6    . Years of Education: N/A   Occupational History  . retired    Social History Main Topics  . Smoking status: Current Every Day Smoker -- 1.50 packs/day for 60 years    Types: Cigarettes  . Smokeless tobacco: Never Used     Comment: pt using electric cig  . Alcohol Use: No  . Drug Use: No  . Sexual Activity: Not Currently   Other Topics Concern  . Not on file   Social History Narrative  . No narrative on file    Family History  Problem Relation Age of Onset  . Arthritis Mother   . Heart failure Mother   . Hypertension Mother   . Diabetes Mother   . Heart failure Daughter  deceased age 68  . Other Daughter     deceased age 57 MVA    ROS: no fevers or chills, productive cough, hemoptysis, dysphasia, odynophagia, melena, hematochezia, dysuria, hematuria, rash, seizure activity, orthopnea, PND, pedal edema, claudication. Remaining systems are negative.  Physical Exam:   Blood pressure 156/88, pulse 50, height 5\' 8"  (1.727 m), weight 195 lb (88.451 kg), SpO2 92.00%.  General:  Well developed/well nourished in NAD Skin warm/dry Patient not depressed No peripheral clubbing Back-normal HEENT-normal/normal eyelids Neck supple/normal carotid upstroke bilaterally; no bruits; no JVD; no thyromegaly chest - diminished breath sounds right lower lobe. CV - RRR/normal S1 and S2; no murmurs, rubs or gallops;  PMI nondisplaced Abdomen -NT/ND, no HSM, no mass, + bowel sounds, no bruit 2+ femoral pulses, no bruits Ext-no edema, chords, 2+ DP Neuro-grossly nonfocal  ECG sinus rhythm at a rate of 50. First degree AV block. Left axis deviation. Left ventricular hypertrophy. Anterior lateral T-wave inversion.

## 2012-11-27 NOTE — Assessment & Plan Note (Signed)
Blood pressure is mildly elevated. Follow and increase medications as needed her primary care.

## 2012-11-27 NOTE — Assessment & Plan Note (Signed)
Patient counseled on discontinuing. 

## 2012-11-27 NOTE — Patient Instructions (Signed)
Your physician recommends that you schedule a follow-up appointment in: AS NEEDED  

## 2012-11-27 NOTE — Assessment & Plan Note (Addendum)
Patient apparently had postoperative atrial fibrillation following abdominal aortic aneurysm repair 2 years ago. He remains in sinus rhythm and has had no recurrences by report. He can continue beta-blockade. I do not think he requires long-term anticoagulation for one bout of postoperative atrial fibrillation. However he apparently has a hypercoagulable state and history of DVT. In reviewing Dr Gustavo Lah last office note the patient should be receiving xeralto. I will ask the patient to contact his office for clarification as he may need this anticoagulant long-term. Note he has a mesothelioma which apparently is nonoperable.

## 2012-11-28 ENCOUNTER — Other Ambulatory Visit: Payer: Self-pay | Admitting: Emergency Medicine

## 2012-11-29 ENCOUNTER — Other Ambulatory Visit (HOSPITAL_COMMUNITY): Payer: Medicare Other

## 2012-11-29 ENCOUNTER — Telehealth: Payer: Self-pay | Admitting: *Deleted

## 2012-11-29 NOTE — Telephone Encounter (Signed)
Steve [name] and their dgtr stopped in the office on 11/28/12 asking if Steve [name] should be Xarelto. He recently saw Dr Jens Som who asked him to f/u with Dr Myna Hidalgo about resuming Xarelto. He is currently taking ASA 81 mg daily. Upon further review of the chart, it appears he should still be on Xarelto. Reviewed with Dr Myna Hidalgo who confirmed this. Steve. [name] has an upcoming appt on 12/10/12 with Dr Myna Hidalgo. During that time he will discuss resuming Xarelto. Explained to Steve [name] with verbalized understanding.

## 2012-12-03 ENCOUNTER — Encounter (HOSPITAL_COMMUNITY): Payer: Self-pay

## 2012-12-03 ENCOUNTER — Encounter (HOSPITAL_COMMUNITY)
Admission: RE | Admit: 2012-12-03 | Discharge: 2012-12-03 | Disposition: A | Discharge status: Home / Self Care | Payer: Medicare Other | Source: Ambulatory Visit | Attending: Hematology & Oncology | Admitting: Hematology & Oncology

## 2012-12-03 DIAGNOSIS — M5416 Radiculopathy, lumbar region: Secondary | ICD-10-CM

## 2012-12-03 DIAGNOSIS — C450 Mesothelioma of pleura: Secondary | ICD-10-CM

## 2012-12-03 DIAGNOSIS — C3490 Malignant neoplasm of unspecified part of unspecified bronchus or lung: Secondary | ICD-10-CM | POA: Insufficient documentation

## 2012-12-03 DIAGNOSIS — D472 Monoclonal gammopathy: Secondary | ICD-10-CM

## 2012-12-03 MED ORDER — FLUDEOXYGLUCOSE F - 18 (FDG) INJECTION
18.4000 | Freq: Once | INTRAVENOUS | Status: AC | PRN
  Administered 2012-12-03: 18.4 via INTRAVENOUS

## 2012-12-06 ENCOUNTER — Ambulatory Visit: Payer: Medicare Other | Admitting: Hematology & Oncology

## 2012-12-06 ENCOUNTER — Other Ambulatory Visit: Payer: Medicare Other | Admitting: Lab

## 2012-12-10 ENCOUNTER — Other Ambulatory Visit (HOSPITAL_BASED_OUTPATIENT_CLINIC_OR_DEPARTMENT_OTHER): Payer: Medicare Other | Admitting: Lab

## 2012-12-10 ENCOUNTER — Other Ambulatory Visit: Payer: Self-pay | Admitting: Nurse Practitioner

## 2012-12-10 ENCOUNTER — Ambulatory Visit (HOSPITAL_BASED_OUTPATIENT_CLINIC_OR_DEPARTMENT_OTHER): Payer: Medicare Other

## 2012-12-10 ENCOUNTER — Ambulatory Visit (HOSPITAL_BASED_OUTPATIENT_CLINIC_OR_DEPARTMENT_OTHER): Payer: Medicare Other | Admitting: Hematology & Oncology

## 2012-12-10 VITALS — BP 163/62 | HR 53 | Temp 97.5°F | Resp 18 | Wt 198.0 lb

## 2012-12-10 DIAGNOSIS — D6859 Other primary thrombophilia: Secondary | ICD-10-CM

## 2012-12-10 DIAGNOSIS — I4891 Unspecified atrial fibrillation: Secondary | ICD-10-CM

## 2012-12-10 DIAGNOSIS — C3490 Malignant neoplasm of unspecified part of unspecified bronchus or lung: Secondary | ICD-10-CM

## 2012-12-10 DIAGNOSIS — I2699 Other pulmonary embolism without acute cor pulmonale: Secondary | ICD-10-CM

## 2012-12-10 DIAGNOSIS — D472 Monoclonal gammopathy: Secondary | ICD-10-CM

## 2012-12-10 DIAGNOSIS — C450 Mesothelioma of pleura: Secondary | ICD-10-CM

## 2012-12-10 DIAGNOSIS — C73 Malignant neoplasm of thyroid gland: Secondary | ICD-10-CM

## 2012-12-10 LAB — CBC WITH DIFFERENTIAL (CANCER CENTER ONLY)
BASO#: 0 10*3/uL (ref 0.0–0.2)
BASO%: 0.1 % (ref 0.0–2.0)
EOS%: 1.9 % (ref 0.0–7.0)
Eosinophils Absolute: 0.2 10*3/uL (ref 0.0–0.5)
HCT: 42.1 % (ref 38.7–49.9)
HGB: 13.9 g/dL (ref 13.0–17.1)
LYMPH#: 1.5 10*3/uL (ref 0.9–3.3)
LYMPH%: 17.4 % (ref 14.0–48.0)
MCH: 32 pg (ref 28.0–33.4)
MCHC: 33 g/dL (ref 32.0–35.9)
MCV: 97 fL (ref 82–98)
MONO#: 0.5 10*3/uL (ref 0.1–0.9)
MONO%: 5.8 % (ref 0.0–13.0)
NEUT#: 6.6 10*3/uL — ABNORMAL HIGH (ref 1.5–6.5)
NEUT%: 74.8 % (ref 40.0–80.0)
Platelets: 126 10*3/uL — ABNORMAL LOW (ref 145–400)
RBC: 4.35 10*6/uL (ref 4.20–5.70)
RDW: 14.6 % (ref 11.1–15.7)
WBC: 8.8 10*3/uL (ref 4.0–10.0)

## 2012-12-10 LAB — COMPREHENSIVE METABOLIC PANEL
Albumin: 3.6 g/dL (ref 3.5–5.2)
Alkaline Phosphatase: 79 U/L (ref 39–117)
BUN: 20 mg/dL (ref 6–23)
Glucose, Bld: 97 mg/dL (ref 70–99)
Potassium: 3.5 mEq/L (ref 3.5–5.3)

## 2012-12-10 MED ORDER — CYANOCOBALAMIN 1000 MCG/ML IJ SOLN
INTRAMUSCULAR | Status: AC
  Filled 2012-12-10: qty 1

## 2012-12-10 MED ORDER — RIVAROXABAN 20 MG PO TABS: 20.0000 mg | ORAL_TABLET | Freq: Every day | ORAL | Status: DC

## 2012-12-10 MED ORDER — CYANOCOBALAMIN 1000 MCG/ML IJ SOLN
1000.0000 ug | Freq: Once | INTRAMUSCULAR | Status: AC
  Administered 2012-12-10: 1000 ug via INTRAMUSCULAR

## 2012-12-10 NOTE — Patient Instructions (Signed)
Cyanocobalamin, Vitamin B12 injection °What is this medicine? °CYANOCOBALAMIN (sye an oh koe BAL a min) is a man made form of vitamin B12. Vitamin B12 is used in the growth of healthy blood cells, nerve cells, and proteins in the body. It also helps with the metabolism of fats and carbohydrates. This medicine is used to treat people who can not absorb vitamin B12. °This medicine may be used for other purposes; ask your health care provider or pharmacist if you have questions. °COMMON BRAND NAME(S): Cyomin, LA-12 , Nutri-Twelve , Primabalt °What should I tell my health care provider before I take this medicine? °They need to know if you have any of these conditions: °-kidney disease °-Leber's disease °-megaloblastic anemia °-an unusual or allergic reaction to cyanocobalamin, cobalt, other medicines, foods, dyes, or preservatives °-pregnant or trying to get pregnant °-breast-feeding °How should I use this medicine? °This medicine is injected into a muscle or deeply under the skin. It is usually given by a health care professional in a clinic or doctor's office. However, your doctor may teach you how to inject yourself. Follow all instructions. °Talk to your pediatrician regarding the use of this medicine in children. Special care may be needed. °Overdosage: If you think you have taken too much of this medicine contact a poison control center or emergency room at once. °NOTE: This medicine is only for you. Do not share this medicine with others. °What if I miss a dose? °If you are given your dose at a clinic or doctor's office, call to reschedule your appointment. If you give your own injections and you miss a dose, take it as soon as you can. If it is almost time for your next dose, take only that dose. Do not take double or extra doses. °What may interact with this medicine? °-colchicine °-heavy alcohol intake °This list may not describe all possible interactions. Give your health care provider a list of all the  medicines, herbs, non-prescription drugs, or dietary supplements you use. Also tell them if you smoke, drink alcohol, or use illegal drugs. Some items may interact with your medicine. °What should I watch for while using this medicine? °Visit your doctor or health care professional regularly. You may need blood work done while you are taking this medicine. °You may need to follow a special diet. Talk to your doctor. Limit your alcohol intake and avoid smoking to get the best benefit. °What side effects may I notice from receiving this medicine? °Side effects that you should report to your doctor or health care professional as soon as possible: °-allergic reactions like skin rash, itching or hives, swelling of the face, lips, or tongue °-blue tint to skin °-chest tightness, pain °-difficulty breathing, wheezing °-dizziness °-red, swollen painful area on the leg °Side effects that usually do not require medical attention (report to your doctor or health care professional if they continue or are bothersome): °-diarrhea °-headache °This list may not describe all possible side effects. Call your doctor for medical advice about side effects. You may report side effects to FDA at 1-800-FDA-1088. °Where should I keep my medicine? °Keep out of the reach of children. °Store at room temperature between 15 and 30 degrees C (59 and 85 degrees F). Protect from light. Throw away any unused medicine after the expiration date. °NOTE: This sheet is a summary. It may not cover all possible information. If you have questions about this medicine, talk to your doctor, pharmacist, or health care provider. °© 2014, [employer]. (2007-04-08   22:10:20) ° °

## 2012-12-10 NOTE — Patient Instructions (Signed)
Carboplatin injection What is this medicine? CARBOPLATIN (KAR boe pla tin) is a chemotherapy drug. It targets fast dividing cells, like cancer cells, and causes these cells to die. This medicine is used to treat ovarian cancer and many other cancers. This medicine may be used for other purposes; ask your health care provider or pharmacist if you have questions. COMMON BRAND NAME(S): Paraplatin What should I tell my health care provider before I take this medicine? They need to know if you have any of these conditions: -blood disorders -hearing problems -kidney disease -recent or ongoing radiation therapy -an unusual or allergic reaction to carboplatin, cisplatin, other chemotherapy, other medicines, foods, dyes, or preservatives -pregnant or trying to get pregnant -breast-feeding How should I use this medicine? This drug is usually given as an infusion into a vein. It is administered in a hospital or clinic by a specially trained health care professional. Talk to your pediatrician regarding the use of this medicine in children. Special care may be needed. Overdosage: If you think you have taken too much of this medicine contact a poison control center or emergency room at once. NOTE: This medicine is only for you. Do not share this medicine with others. What if I miss a dose? It is important not to miss a dose. Call your doctor or health care professional if you are unable to keep an appointment. What may interact with this medicine? -medicines for seizures -medicines to increase blood counts like filgrastim, pegfilgrastim, sargramostim -some antibiotics like amikacin, gentamicin, neomycin, streptomycin, tobramycin -vaccines Talk to your doctor or health care professional before taking any of these medicines: -acetaminophen -aspirin -ibuprofen -ketoprofen -naproxen This list may not describe all possible interactions. Give your health care provider a list of all the medicines, herbs,  non-prescription drugs, or dietary supplements you use. Also tell them if you smoke, drink alcohol, or use illegal drugs. Some items may interact with your medicine. What should I watch for while using this medicine? Your condition will be monitored carefully while you are receiving this medicine. You will need important blood work done while you are taking this medicine. This drug may make you feel generally unwell. This is not uncommon, as chemotherapy can affect healthy cells as well as cancer cells. Report any side effects. Continue your course of treatment even though you feel ill unless your doctor tells you to stop. In some cases, you may be given additional medicines to help with side effects. Follow all directions for their use. Call your doctor or health care professional for advice if you get a fever, chills or sore throat, or other symptoms of a cold or flu. Do not treat yourself. This drug decreases your body's ability to fight infections. Try to avoid being around people who are sick. This medicine may increase your risk to bruise or bleed. Call your doctor or health care professional if you notice any unusual bleeding. Be careful brushing and flossing your teeth or using a toothpick because you may get an infection or bleed more easily. If you have any dental work done, tell your dentist you are receiving this medicine. Avoid taking products that contain aspirin, acetaminophen, ibuprofen, naproxen, or ketoprofen unless instructed by your doctor. These medicines may hide a fever. Do not become pregnant while taking this medicine. Women should inform their doctor if they wish to become pregnant or think they might be pregnant. There is a potential for serious side effects to an unborn child. Talk to your health care professional or   pharmacist for more information. Do not breast-feed an infant while taking this medicine. What side effects may I notice from receiving this medicine? Side effects  that you should report to your doctor or health care professional as soon as possible: -allergic reactions like skin rash, itching or hives, swelling of the face, lips, or tongue -signs of infection - fever or chills, cough, sore throat, pain or difficulty passing urine -signs of decreased platelets or bleeding - bruising, pinpoint red spots on the skin, black, tarry stools, nosebleeds -signs of decreased red blood cells - unusually weak or tired, fainting spells, lightheadedness -breathing problems -changes in hearing -changes in vision -chest pain -high blood pressure -low blood counts - This drug may decrease the number of white blood cells, red blood cells and platelets. You may be at increased risk for infections and bleeding. -nausea and vomiting -pain, swelling, redness or irritation at the injection site -pain, tingling, numbness in the hands or feet -problems with balance, talking, walking -trouble passing urine or change in the amount of urine Side effects that usually do not require medical attention (report to your doctor or health care professional if they continue or are bothersome): -hair loss -loss of appetite -metallic taste in the mouth or changes in taste This list may not describe all possible side effects. Call your doctor for medical advice about side effects. You may report side effects to FDA at 1-800-FDA-1088. Where should I keep my medicine? This drug is given in a hospital or clinic and will not be stored at home. NOTE: This sheet is a summary. It may not cover all possible information. If you have questions about this medicine, talk to your doctor, pharmacist, or health care provider.  2014, Elsevier/Gold Standard. (2007-04-02 14:38:05) Pemetrexed injection What is this medicine? PEMETREXED (PEM e TREX ed) is a chemotherapy drug. This medicine affects cells that are rapidly growing, such as cancer cells and cells in your mouth and stomach. It is usually used to  treat lung cancers like non-small cell lung cancer and mesothelioma. It may also be used to treat other cancers. This medicine may be used for other purposes; ask your health care provider or pharmacist if you have questions. COMMON BRAND NAME(S): Alimta What should I tell my health care provider before I take this medicine? They need to know if you have any of these conditions: -if you frequently drink alcohol containing beverages -infection (especially a virus infection such as chickenpox, cold sores, or herpes) -kidney disease -liver disease -low blood counts, like low platelets, red bloods, or white blood cells -an unusual or allergic reaction to pemetrexed, mannitol, other medicines, foods, dyes, or preservatives -pregnant or trying to get pregnant -breast-feeding How should I use this medicine? This drug is given as an infusion into a vein. It is administered in a hospital or clinic by a specially trained health care professional. Talk to your pediatrician regarding the use of this medicine in children. Special care may be needed. Overdosage: If you think you have taken too much of this medicine contact a poison control center or emergency room at once. NOTE: This medicine is only for you. Do not share this medicine with others. What if I miss a dose? It is important not to miss your dose. Call your doctor or health care professional if you are unable to keep an appointment. What may interact with this medicine? -aspirin and aspirin-like medicines -medicines to increase blood counts like filgrastim, pegfilgrastim, sargramostim -methotrexate -NSAIDS, medicines for pain   and inflammation, like ibuprofen or naproxen -probenecid -pyrimethamine -vaccines Talk to your doctor or health care professional before taking any of these medicines: -acetaminophen -aspirin -ibuprofen -ketoprofen -naproxen This list may not describe all possible interactions. Give your health care provider a  list of all the medicines, herbs, non-prescription drugs, or dietary supplements you use. Also tell them if you smoke, drink alcohol, or use illegal drugs. Some items may interact with your medicine. What should I watch for while using this medicine? Visit your doctor for checks on your progress. This drug may make you feel generally unwell. This is not uncommon, as chemotherapy can affect healthy cells as well as cancer cells. Report any side effects. Continue your course of treatment even though you feel ill unless your doctor tells you to stop. In some cases, you may be given additional medicines to help with side effects. Follow all directions for their use. Call your doctor or health care professional for advice if you get a fever, chills or sore throat, or other symptoms of a cold or flu. Do not treat yourself. This drug decreases your body's ability to fight infections. Try to avoid being around people who are sick. This medicine may increase your risk to bruise or bleed. Call your doctor or health care professional if you notice any unusual bleeding. Be careful brushing and flossing your teeth or using a toothpick because you may get an infection or bleed more easily. If you have any dental work done, tell your dentist you are receiving this medicine. Avoid taking products that contain aspirin, acetaminophen, ibuprofen, naproxen, or ketoprofen unless instructed by your doctor. These medicines may hide a fever. Call your doctor or health care professional if you get diarrhea or mouth sores. Do not treat yourself. To protect your kidneys, drink water or other fluids as directed while you are taking this medicine. Men and women must use effective birth control while taking this medicine. You may also need to continue using effective birth control for a time after stopping this medicine. Do not become pregnant while taking this medicine. Tell your doctor right away if you think that you or your partner  might be pregnant. There is a potential for serious side effects to an unborn child. Talk to your health care professional or pharmacist for more information. Do not breast-feed an infant while taking this medicine. This medicine may lower sperm counts. What side effects may I notice from receiving this medicine? Side effects that you should report to your doctor or health care professional as soon as possible: -allergic reactions like skin rash, itching or hives, swelling of the face, lips, or tongue -low blood counts - this medicine may decrease the number of white blood cells, red blood cells and platelets. You may be at increased risk for infections and bleeding. -signs of infection - fever or chills, cough, sore throat, pain or difficulty passing urine -signs of decreased platelets or bleeding - bruising, pinpoint red spots on the skin, black, tarry stools, blood in the urine -signs of decreased red blood cells - unusually weak or tired, fainting spells, lightheadedness -breathing problems, like a dry cough -changes in emotions or moods -chest pain -confusion -diarrhea -high blood pressure -mouth or throat sores or ulcers -pain, swelling, warmth in the leg -pain on swallowing -swelling of the ankles, feet, hands -trouble passing urine or change in the amount of urine -vomiting -yellowing of the eyes or skin Side effects that usually do not require medical attention (report to   your doctor or health care professional if they continue or are bothersome): -hair loss -loss of appetite -nausea -stomach upset This list may not describe all possible side effects. Call your doctor for medical advice about side effects. You may report side effects to FDA at 1-800-FDA-1088. Where should I keep my medicine? This drug is given in a hospital or clinic and will not be stored at home. NOTE: This sheet is a summary. It may not cover all possible information. If you have questions about this  medicine, talk to your doctor, pharmacist, or health care provider.  2014, Elsevier/Gold Standard. (2007-07-30 13:24:03)  

## 2012-12-11 ENCOUNTER — Institutional Professional Consult (permissible substitution): Payer: Medicare Other | Admitting: Cardiology

## 2012-12-11 NOTE — Progress Notes (Signed)
This office note has been dictated.

## 2012-12-12 ENCOUNTER — Telehealth: Payer: Self-pay | Admitting: Hematology & Oncology

## 2012-12-12 NOTE — Telephone Encounter (Signed)
Pt aware of 1-5 chemo education,1-6 port at [facility] to be NPO after midnight and he needs a driver. Pt is also aware of 1-7 tx appointment

## 2012-12-15 NOTE — Progress Notes (Signed)
DIAGNOSES: 1. Progressive mesothelioma of the right lung. 2. Deep venous thrombosis of the right leg. 3. Factor V Leiden mutation - heterozygote. 4. Positive lupus anticoagulant. 5. 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 6. Papillary carcinoma of the thyroid.  CURRENT THERAPY: 1. Xarelto 20 mg p.o. daily. 2. The patient to start carboplatin/Alimta after the holidays.  INTERIM HISTORY:  Mr. Huegel comes in for a followup.  He is doing okay. He is a little more short of breath.  He is still smoking quite a bit. Unfortunately, when we did his PET scan, it looked like his tumor was growing.  The PET scan was done on November 25th.  It did show some progression of his tumor.  There was no pleural effusion.  No metastatic disease was noted outside of the right lung/pleural space.  He was seen by Radiation Oncology.  They do not think that he would be a candidate for radiation therapy.  He apparently, I think, stopped the Xarelto.  I told him that he needs to be on this for life.  He has multiple hypercoagulable factors.  As such, the Xarelto is something that is needed for him.  I told him that without the Xarelto, he would be at significant risk for having another blood clot.  I think Mr. Mergenthaler real issue is him smoking.  He smokes a couple packs a day.  When we saw him today, his pre-albumin was 16.1.  We did check his monoclonal spike with his last visit.  His monoclonal spike was 0.95 mg/dL.  PHYSICAL EXAMINATION:  General:  This is an elderly appearing white gentleman, in no obvious distress.  Vital Signs:  Temperature of 97.5, pulse 53, respiratory rate 18, blood pressure 163/62, weight is 198 pounds.  Head and Neck Exam:  Normocephalic, atraumatic skull.  There are no ocular or oral lesions.  He has no palpable cervical or supraclavicular lymph nodes.  Lungs:  Some decreased breath sounds over in the right lung field.  The left lung field shows  good breath sounds. He has some crackles bilaterally.  Cardiac Exam:  Regular rate and rhythm with normal S1 and S2.  There are no murmurs, rubs, or bruits. Abdomen:  Soft.  He has good bowel sounds.  There is no fluid wave. There is no palpable hepatosplenomegaly.  Back Exam:  No tenderness over the spine, ribs, or hips.  Extremities:  No clubbing, cyanosis or edema. Neurological:  No focal neurological deficits.  LABORATORY STUDIES:  White cell count is 8.8, hemoglobin 14, hematocrit 42.1, platelet count 126.  BUN 20, creatinine 0.94, calcium 9.5 with an albumin of 3.6, prealbumin is 16.1.  IMPRESSION:  Mr. Newman is a 79 year old gentleman with mesothelioma.  I think this is going to be his biggest problem right now.  This does seem to be progressing along.  I think at this point, the only option that we are going to have for him is chemotherapy.  I think that this might be a little bit tricky given his age and his performance status (ECOG 1-2).  However, I just do not see that we have any other option.  His mesothelioma is progressing.  I spent about an hour with him and his family.  They are all very nice. I always enjoy seeing Mr. Geisen.   Of note, his wife is doing much better.  She actually fell in the hallway outside of our office a few months ago and fractured, I think, a couple of bones.  I think that carboplatin and Alimta would be the options that we would have for him.  I talked to him and his family about this.  I told them that the chance of chemotherapy working probably would be no more than 25%.  I think if he is going to have chemotherapy, he is going to need to have a Port-A-Cath placed.  I gave them information about carboplatin and Alimta.  I did give him a vitamin B12 shot.  I think we can get started after the holidays.  I want him to be able to enjoy the holidays with his family. We will see about trying to get started the first week in  January.  We will have the Port-A-Cath placed like the day before.  I will probably give him 2 cycles of treatment, and then re-evaluate him with our scans.  I went over the side effects with him.  I told him that he would lose his hair.  I told him about nausea and vomiting.  I told him about neuropathy.  I told him about the possibility of diarrhea.  He understands all this.  He knows that what we are going to do is not going to cure him, but hopefully, we will help slow this process down.  I will plan to see him back when he has his chemotherapy just to make sure he is doing okay.    ______________________________ Josph Macho, M.D. PRE/MEDQ  D:  12/11/2012  T:  12/14/2012  Job:  [id number]

## 2012-12-16 NOTE — Progress Notes (Signed)
DIAGNOSES: 1. Mesothelioma of the right lung. 2. 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 3. Deep venous thrombosis of the right leg. 4. Heterozygote for factor V Leiden mutation. 5. Lupus anticoagulant. 6. 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  CURRENT THERAPY:  Xarelto 20 mg p.o. daily.  INTERIM HISTORY:  Steve [name] comes in for followup.  We last saw him back in July.  Since then, his wife actually fell in our building about a month or so ago.  She fractured her right arm.  She fractured her right pelvis.  She really took a bad fall.  He has been trying to help with her as well as her family has been helping.  We did his monoclonal studies back in July.  His monoclonal spike was stable at 1.04 g/dL.  His IgA level was 1180 mg/dL.  Kappa light chain was 11 mg/dL.  He is having no problems with the Xarelto.  There is no bleeding.  His legs have been feeling okay.  We did do a PET scan on him.  This unfortunately showed some progression of the mesothelioma.  The PET scan shows a plaque along the medial pleura measuring 4.7 x 0.7 cm.  This has never been mentioned on a past PET scan, but now has been showing to have "progressed."  He does have pleural nodule in the right apex measuring 1.5 x 1.4 cm.  I also noticed some worsening nodular pleural thickening.  He has no cough or shortness of breath.  He still smokes about a pack and half a day.  PHYSICAL EXAMINATION:  General:  This is an elderly, very well-nourished white gentleman, in no obvious distress.  Vital Signs:  Temperature of 97.6, pulse 70, respiratory rate 18, blood pressure 146/59.  Weight is 194.  Head and Neck:  Normocephalic, atraumatic skull.  There are no ocular or oral lesions.  There are no palpable cervical or supraclavicular lymph nodes.  Lungs:  Clear to percussion and auscultation bilaterally.  Cardiac:  Regular rate and rhythm with a normal S1 and S2.   There are no murmurs, rubs, or bruits.  Abdomen: Soft.  He has good bowel sounds.  There is no fluid wave.  There is no palpable hepatosplenomegaly.  Back:  No tenderness over the spine, ribs, or hips.  Extremities:  No clubbing, cyanosis, or edema.  No venous cord is noted in his legs.  LABORATORY STUDIES:  White cell count 7.3, hemoglobin 14.4, hematocrit 43.2, platelet count 132.  IMPRESSION:  Mr. Ishmael is a 79 year old gentleman with multiple, multiple problems.  His case is incredibly complicated.  I spent about 40 minutes or so with him and his daughter.  It looks like his mesothelioma is progressing.  As such, we are going to have to see about treatment.  I want to see if radiation could be done. He is 79 years old.  His performance status is ECOG 1-2.  Since the mesothelioma might be localized, he may be a radiation candidate. Unfortunately, there now is this plaque about the right atrium.  This may preclude him having radiation.  If he cannot have radiation, then we may have to consider chemotherapy with carboplatin/Alimta.  I probably would want to get another PET scan on him in a couple of months to see if we can find out how aggressive this might be.  The Xarelto is doing well for the DVT.  His papillary carcinoma of the thyroid is not a problem.  He  is on Synthroid.  We will check his monoclonal studies.  Again, Mr. Hickle is incredibly complicated.    ______________________________ Josph Macho, M.D. PRE/MEDQ  D:  10/03/2012  T:  12/15/2012  Job:  [id number]

## 2012-12-18 ENCOUNTER — Telehealth: Payer: Self-pay | Admitting: Hematology & Oncology

## 2012-12-18 ENCOUNTER — Other Ambulatory Visit: Payer: Self-pay | Admitting: Family Medicine

## 2012-12-18 NOTE — Telephone Encounter (Signed)
OK to send 90 no refills 

## 2012-12-18 NOTE — Telephone Encounter (Signed)
Please advise:  Medication name:  Name from pharmacy:  ALPRAZolam (XANAX) 0.25 MG tablet  ALPRAZolam 0.25MG  TAB Sig: TAKE ONE TABLET BY MOUTH THREE TIMES DAILY AS NEEDED FOR SLEEP Dispense: 90 tablet Refills: 0 Start: 12/18/2012 Class: Normal Requested on: 07/16/2012 Originally ordered on: 01/18/2012 Last refill: 09/27/2012

## 2012-12-18 NOTE — Telephone Encounter (Signed)
Bonita Quin called wanting to move appointments out a week. Per MD that's fine. I left message for pt to call for new appointments. Amy RN is going to call with instructions for port since he tx will start on 1-15

## 2012-12-19 ENCOUNTER — Other Ambulatory Visit: Payer: Self-pay | Admitting: Family Medicine

## 2012-12-19 NOTE — Telephone Encounter (Signed)
RX faxed

## 2012-12-31 ENCOUNTER — Other Ambulatory Visit: Payer: Self-pay | Admitting: Family Medicine

## 2013-01-13 ENCOUNTER — Other Ambulatory Visit

## 2013-01-14 ENCOUNTER — Other Ambulatory Visit (HOSPITAL_COMMUNITY): Payer: Medicare Other

## 2013-01-14 ENCOUNTER — Ambulatory Visit (HOSPITAL_COMMUNITY): Payer: Medicare Other

## 2013-01-15 ENCOUNTER — Ambulatory Visit: Payer: Medicare Other

## 2013-01-15 ENCOUNTER — Ambulatory Visit: Payer: Medicare Other | Admitting: Hematology & Oncology

## 2013-01-15 ENCOUNTER — Other Ambulatory Visit: Payer: Medicare Other | Admitting: Lab

## 2013-01-16 ENCOUNTER — Ambulatory Visit: Payer: Medicare Other | Admitting: Neurology

## 2013-01-16 ENCOUNTER — Encounter: Payer: Self-pay | Admitting: *Deleted

## 2013-01-16 ENCOUNTER — Other Ambulatory Visit: Payer: Self-pay | Admitting: Radiology

## 2013-01-16 ENCOUNTER — Other Ambulatory Visit: Payer: Self-pay | Admitting: *Deleted

## 2013-01-16 ENCOUNTER — Encounter (HOSPITAL_COMMUNITY): Payer: Self-pay | Admitting: Pharmacy Technician

## 2013-01-20 ENCOUNTER — Other Ambulatory Visit: Payer: Self-pay | Admitting: Family Medicine

## 2013-01-21 ENCOUNTER — Ambulatory Visit (HOSPITAL_COMMUNITY)
Admission: RE | Admit: 2013-01-21 | Discharge: 2013-01-21 | Disposition: A | Discharge status: Home / Self Care | Payer: Medicare Other | Source: Ambulatory Visit | Attending: Hematology & Oncology | Admitting: Hematology & Oncology

## 2013-01-21 ENCOUNTER — Other Ambulatory Visit: Payer: Self-pay | Admitting: Radiology

## 2013-01-21 ENCOUNTER — Other Ambulatory Visit: Payer: Self-pay | Admitting: *Deleted

## 2013-01-21 ENCOUNTER — Encounter (HOSPITAL_COMMUNITY): Payer: Self-pay

## 2013-01-21 ENCOUNTER — Encounter (HOSPITAL_COMMUNITY): Payer: Self-pay | Admitting: Pharmacy Technician

## 2013-01-21 VITALS — BP 185/76 | HR 52 | Temp 96.8°F | Resp 18

## 2013-01-21 DIAGNOSIS — Z5111 Encounter for antineoplastic chemotherapy: Secondary | ICD-10-CM

## 2013-01-21 DIAGNOSIS — C450 Mesothelioma of pleura: Secondary | ICD-10-CM

## 2013-01-21 DIAGNOSIS — I4891 Unspecified atrial fibrillation: Secondary | ICD-10-CM

## 2013-01-21 MED ORDER — SODIUM CHLORIDE 0.9 % IV SOLN: INTRAVENOUS | Status: DC

## 2013-01-21 MED ORDER — LIDOCAINE-PRILOCAINE 2.5-2.5 % EX CREA: 1.0000 "application " | TOPICAL_CREAM | CUTANEOUS | Status: DC | PRN

## 2013-01-21 MED ORDER — CEFAZOLIN SODIUM-DEXTROSE 2-3 GM-% IV SOLR: 2.0000 g | INTRAVENOUS | Status: DC

## 2013-01-21 NOTE — Progress Notes (Signed)
Patient took Xarelto yesterday evening. Port-a-cath placement rescheduled for tomorrow 01/22/13.

## 2013-01-22 ENCOUNTER — Other Ambulatory Visit: Payer: Self-pay | Admitting: Hematology & Oncology

## 2013-01-22 ENCOUNTER — Ambulatory Visit (HOSPITAL_COMMUNITY)
Admission: RE | Admit: 2013-01-22 | Discharge: 2013-01-22 | Disposition: A | Discharge status: Home / Self Care | Payer: Medicare Other | Source: Ambulatory Visit | Attending: Hematology & Oncology | Admitting: Hematology & Oncology

## 2013-01-22 ENCOUNTER — Encounter (HOSPITAL_COMMUNITY): Payer: Self-pay

## 2013-01-22 DIAGNOSIS — R0602 Shortness of breath: Secondary | ICD-10-CM | POA: Insufficient documentation

## 2013-01-22 DIAGNOSIS — R059 Cough, unspecified: Secondary | ICD-10-CM | POA: Insufficient documentation

## 2013-01-22 DIAGNOSIS — R05 Cough: Secondary | ICD-10-CM | POA: Insufficient documentation

## 2013-01-22 DIAGNOSIS — J449 Chronic obstructive pulmonary disease, unspecified: Secondary | ICD-10-CM | POA: Insufficient documentation

## 2013-01-22 DIAGNOSIS — K219 Gastro-esophageal reflux disease without esophagitis: Secondary | ICD-10-CM | POA: Insufficient documentation

## 2013-01-22 DIAGNOSIS — I714 Abdominal aortic aneurysm, without rupture, unspecified: Secondary | ICD-10-CM | POA: Insufficient documentation

## 2013-01-22 DIAGNOSIS — I4891 Unspecified atrial fibrillation: Secondary | ICD-10-CM

## 2013-01-22 DIAGNOSIS — J4489 Other specified chronic obstructive pulmonary disease: Secondary | ICD-10-CM | POA: Insufficient documentation

## 2013-01-22 DIAGNOSIS — F17200 Nicotine dependence, unspecified, uncomplicated: Secondary | ICD-10-CM | POA: Insufficient documentation

## 2013-01-22 DIAGNOSIS — C450 Mesothelioma of pleura: Secondary | ICD-10-CM

## 2013-01-22 DIAGNOSIS — Z7901 Long term (current) use of anticoagulants: Secondary | ICD-10-CM | POA: Insufficient documentation

## 2013-01-22 DIAGNOSIS — C384 Malignant neoplasm of pleura: Secondary | ICD-10-CM | POA: Insufficient documentation

## 2013-01-22 DIAGNOSIS — G4730 Sleep apnea, unspecified: Secondary | ICD-10-CM | POA: Insufficient documentation

## 2013-01-22 DIAGNOSIS — C73 Malignant neoplasm of thyroid gland: Secondary | ICD-10-CM | POA: Insufficient documentation

## 2013-01-22 DIAGNOSIS — F411 Generalized anxiety disorder: Secondary | ICD-10-CM | POA: Insufficient documentation

## 2013-01-22 DIAGNOSIS — Z86718 Personal history of other venous thrombosis and embolism: Secondary | ICD-10-CM | POA: Insufficient documentation

## 2013-01-22 DIAGNOSIS — Z79899 Other long term (current) drug therapy: Secondary | ICD-10-CM | POA: Insufficient documentation

## 2013-01-22 DIAGNOSIS — I10 Essential (primary) hypertension: Secondary | ICD-10-CM | POA: Insufficient documentation

## 2013-01-22 LAB — CBC
HEMATOCRIT: 40.5 % (ref 39.0–52.0)
Hemoglobin: 13.9 g/dL (ref 13.0–17.0)
MCH: 32.6 pg (ref 26.0–34.0)
MCHC: 34.3 g/dL (ref 30.0–36.0)
MCV: 94.8 fL (ref 78.0–100.0)
Platelets: 151 10*3/uL (ref 150–400)
RBC: 4.27 MIL/uL (ref 4.22–5.81)
RDW: 14.3 % (ref 11.5–15.5)
WBC: 8.2 10*3/uL (ref 4.0–10.5)

## 2013-01-22 LAB — PROTIME-INR
INR: 0.93 (ref 0.00–1.49)
Prothrombin Time: 12.3 seconds (ref 11.6–15.2)

## 2013-01-22 LAB — APTT: APTT: 28 s (ref 24–37)

## 2013-01-22 MED ORDER — HEPARIN SOD (PORK) LOCK FLUSH 100 UNIT/ML IV SOLN: 500.0000 [IU] | Freq: Once | INTRAVENOUS | Status: DC

## 2013-01-22 MED ORDER — LIDOCAINE-EPINEPHRINE (PF) 2 %-1:200000 IJ SOLN
INTRAMUSCULAR | Status: AC
  Filled 2013-01-22: qty 20

## 2013-01-22 MED ORDER — FENTANYL CITRATE 0.05 MG/ML IJ SOLN
INTRAMUSCULAR | Status: AC | PRN
  Administered 2013-01-22: 50 ug via INTRAVENOUS

## 2013-01-22 MED ORDER — SODIUM CHLORIDE 0.9 % IV SOLN: INTRAVENOUS | Status: DC

## 2013-01-22 MED ORDER — FENTANYL CITRATE 0.05 MG/ML IJ SOLN
INTRAMUSCULAR | Status: AC
  Filled 2013-01-22: qty 4

## 2013-01-22 MED ORDER — MIDAZOLAM HCL 2 MG/2ML IJ SOLN
INTRAMUSCULAR | Status: AC | PRN
  Administered 2013-01-22: 1 mg via INTRAVENOUS

## 2013-01-22 MED ORDER — CEFAZOLIN SODIUM-DEXTROSE 2-3 GM-% IV SOLR
2.0000 g | Freq: Once | INTRAVENOUS | Status: AC
  Administered 2013-01-22: 2 g via INTRAVENOUS
  Filled 2013-01-22: qty 50

## 2013-01-22 MED ORDER — MIDAZOLAM HCL 2 MG/2ML IJ SOLN
INTRAMUSCULAR | Status: AC
  Filled 2013-01-22: qty 4

## 2013-01-22 MED ORDER — HEPARIN SOD (PORK) LOCK FLUSH 100 UNIT/ML IV SOLN
INTRAVENOUS | Status: AC
  Filled 2013-01-22: qty 5

## 2013-01-22 NOTE — Procedures (Signed)
Placement of right jugular portacath.  Tip at superior cavoatrial junction and ready to use.  No immediate complication.

## 2013-01-22 NOTE — H&P (Signed)
Steve [name] is an 80 y.o. male.   Chief Complaint: "I'm getting a port a cath" HPI: Patient with history of progressive mesothelioma in addition to thyroid carcinoma and MGUS presents today for port a cath placement for chemotherapy.  Past Medical History  Diagnosis Date  . Prostate enlargement   . GERD (gastroesophageal reflux disease)   . AAA (abdominal aortic aneurysm)     stress test 09/14/10 EP[facility]  . Anxiety   . Recurrent upper respiratory infection (URI)     productive cough- white phlegm- no fever  . Hypertension     chest x ray 12/12 EPIC- repeated 06/06/11, EKG 11/12 EP[facility]  . DVT (deep venous thrombosis) 08/2011  . Pneumonia   . Bruises easily     takes Xarelto  . Depression   . Dysrhythmia     hx of atrial fib post op x 2 days   . Atrial fibrillation   . Mesothelioma   . Arthritis     knee and back  . Sleep apnea      12/12 sleep study,SEVERE per study  Dr Lamonte Sakai- states doesnt wear machine on regular basis- setting BiPAP 9/9  . H/O hiatal hernia   . COPD (chronic obstructive pulmonary disease)     patient uses oxygen 2L/[place] at nite   . Epistaxis 05/13/2012  . Olecranon bursitis of right elbow 06/28/2012    Past Surgical History  Procedure Laterality Date  . Knee surgery  1957    left knee arthotomy  . Abdominal aortic aneurysm repair  11/14/2010    AAA Repair    Dr Donnetta Hutching  . Transurethral resection of prostate  01/25/2011    TURP  . Incisional hernia repair  06/12/2011    Procedure: HERNIA REPAIR INCISIONAL;  Surgeon: Earnstine Regal, MD;  Location: [facility];  Service: General;  Laterality: N/A;  Open Repair Ventral Incisional Hernia with Mesh  . Hernia repair    . Video assisted thoracoscopy  11/02/2011    Procedure: VIDEO ASSISTED THORACOSCOPY;  Surgeon: Melrose Nakayama, MD;  Location: [place];  Service: Thoracic;  Laterality: Right;  . Pleural biopsy  11/02/2011    Procedure: PLEURAL BIOPSY;  Surgeon: Melrose Nakayama, MD;  Location: [place];  Service: Thoracic;   Laterality: Right;  Parietal and visceral biopsies  . Chest tube insertion  11/02/2011    Procedure: INSERTION PLEURAL DRAINAGE CATHETER;  Surgeon: Melrose Nakayama, MD;  Location: [place];  Service: Thoracic;  Laterality: Right;  . Decortication  11/02/2011    Procedure: DECORTICATION;  Surgeon: Melrose Nakayama, MD;  Location: [place];  Service: Thoracic;  Laterality: Right;  . Pleural effusion drainage  11/02/2011    Procedure: DRAINAGE OF PLEURAL EFFUSION;  Surgeon: Melrose Nakayama, MD;  Location: [place];  Service: Thoracic;  Laterality: Right;  . Cholecystectomy  01/19/2012    Procedure: LAPAROSCOPIC CHOLECYSTECTOMY WITH INTRAOPERATIVE CHOLANGIOGRAM;  Surgeon: Zenovia Jarred, MD;  Location: [place];  Service: General;  Laterality: N/A;  laparoscopic cholecystectomy with intraoperative cholangiogram  . Ercp  01/22/2012    Procedure: ENDOSCOPIC RETROGRADE CHOLANGIOPANCREATOGRAPHY (ERCP);  Surgeon: Inda Castle, MD;  Location: [place];  Service: Endoscopy;  Laterality: N/A;  with stent placement  . Ercp  01/21/2012    Procedure: ENDOSCOPIC RETROGRADE CHOLANGIOPANCREATOGRAPHY (ERCP);  Surgeon: Milus Banister, MD;  Location: [place];  Service: Gastroenterology;  Laterality: N/A;  . Esophagogastroduodenoscopy  01/24/2012    Procedure: ESOPHAGOGASTRODUODENOSCOPY (EGD);  Surgeon: Inda Castle, MD;  Location: [facility];  Service: Endoscopy;  Laterality: N/A;  remove pancr stent   . Endoscopic retrograde cholangiopancreatography (ercp) with propofol N/A 03/07/2012    Procedure: ENDOSCOPIC RETROGRADE CHOLANGIOPANCREATOGRAPHY (ERCP) WITH PROPOFOL;  Surgeon: Milus Banister, MD;  Location: [facility];  Service: Endoscopy;  Laterality: N/A;  remove stent  . Thyroidectomy N/A 04/04/2012    Procedure:  TOTAL THYROIDECTOMY WITH CENTRAL COMPARTMENT LYMPH NODE DISSECTION;  Surgeon: Earnstine Regal, MD;  Location: [facility];  Service: General;  Laterality: N/A;    Family History  Problem Relation Age of  Onset  . Arthritis Mother   . Heart failure Mother   . Hypertension Mother   . Diabetes Mother   . Heart failure Daughter     deceased age 45  . Other Daughter     deceased age 53 MVA   Social History:  reports that he has been smoking Cigarettes.  He has a 90 pack-year smoking history. He has never used smokeless tobacco. He reports that he does not drink alcohol or use illicit drugs.  Allergies:  Allergies  Allergen Reactions  . Lyrica [Pregabalin] Hives and Itching    Current outpatient prescriptions:ALPRAZolam (XANAX) 0.25 MG tablet, Take 0.25 mg by mouth at bedtime as needed for sleep., Disp: , Rfl: ;  calcium carbonate (OS-CAL - DOSED IN MG OF ELEMENTAL CALCIUM) 1250 MG tablet, Take 2 tablets by mouth 2 (two) times daily., Disp: , Rfl: ;  Cholecalciferol (VITAMIN D3) 1000 UNITS CAPS, Take 1 capsule by mouth daily., Disp: , Rfl:  levothyroxine (SYNTHROID, LEVOTHROID) 150 MCG tablet, Take 150 mcg by mouth daily before breakfast., Disp: , Rfl: ;  losartan (COZAAR) 25 MG tablet, Take 25 mg by mouth daily., Disp: , Rfl: ;  metoprolol tartrate (LOPRESSOR) 25 MG tablet, Take 12.5 mg by mouth 2 (two) times daily., Disp: , Rfl: ;  senna (SENOKOT) 8.6 MG TABS, Take 2 tablets by mouth at bedtime., Disp: , Rfl:  sertraline (ZOLOFT) 100 MG tablet, Take 100 mg by mouth daily with breakfast., Disp: , Rfl: ;  tiotropium (SPIRIVA) 18 MCG inhalation capsule, Place 18 mcg into inhaler and inhale daily., Disp: , Rfl: ;  lidocaine-prilocaine (EMLA) cream, Apply 1 application topically as needed., Disp: 30 g, Rfl: 3;  Rivaroxaban (XARELTO) 20 MG TABS tablet, Take 20 mg by mouth daily with supper., Disp: , Rfl:  traMADol (ULTRAM) 50 MG tablet, Take 50 mg by mouth every 6 (six) hours as needed for moderate pain or severe pain., Disp: , Rfl:  Current facility-administered medications:0.9 %  sodium chloride infusion, , Intravenous, Continuous, Ascencion Dike, PA-C;  ceFAZolin (ANCEF) IVPB 2 g/50 mL premix, 2 g,  Intravenous, Once, Ascencion Dike, PA-C   Results for orders placed during the hospital encounter of 01/22/13 (from the past 48 hour(s))  CBC     Status: None   Collection Time    01/22/13 10:25 AM      Result Value Range   WBC 8.2  4.0 - 10.5 K/uL   RBC 4.27  4.22 - 5.81 MIL/uL   Hemoglobin 13.9  13.0 - 17.0 g/dL   HCT 40.5  39.0 - 52.0 %   MCV 94.8  78.0 - 100.0 fL   MCH 32.6  26.0 - 34.0 pg   MCHC 34.3  30.0 - 36.0 g/dL   RDW 14.3  11.5 - 15.5 %   Platelets 151  150 - 400 K/uL   No results found.  Review of Systems  Constitutional: Negative for fever  and chills.  HENT:       Occ HA's  Respiratory: Positive for cough and shortness of breath. Negative for hemoptysis.   Cardiovascular: Negative for chest pain.  Gastrointestinal: Negative for nausea, vomiting and abdominal pain.  Musculoskeletal: Negative for back pain.  Endo/Heme/Allergies:       Bruises easily    Blood pressure 189/78, pulse 50, temperature 96.5 F (35.8 C), temperature source Oral, resp. rate 18, SpO2 98.00%. Physical Exam  Constitutional: He is oriented to person, place, and time. He appears well-developed and well-nourished.  Cardiovascular: Normal rate and regular rhythm.   Respiratory: Effort normal.  Distant BS bilat, more so on right  GI: Soft. Bowel sounds are normal. There is no tenderness.  Musculoskeletal: Normal range of motion. He exhibits edema.  Neurological: He is alert and oriented to person, place, and time.     Assessment/Plan  Patient with history of progressive mesothelioma in addition to thyroid carcinoma and MGUS presents today for port a cath placement for chemotherapy. Details/risks of procedure d/w pt/family with their understanding and consent.    Royetta Probus,D KEVIN 01/22/2013, 10:37 AM

## 2013-01-22 NOTE — Discharge Instructions (Signed)
Implanted Port Home Guide °An implanted port is a type of central line that is placed under the skin. Central lines are used to provide IV access when treatment or nutrition needs to be given through a person's veins. Implanted ports are used for long-term IV access. An implanted port may be placed because:  °· You need IV medicine that would be irritating to the small veins in your hands or arms.   °· You need long-term IV medicines, such as antibiotics.   °· You need IV nutrition for a long period.   °· You need frequent blood draws for lab tests.   °· You need dialysis.   °Implanted ports are usually placed in the chest area, but they can also be placed in the upper arm, the abdomen, or the leg. An implanted port has two main parts:  °· Reservoir. The reservoir is round and will appear as a small, raised area under your skin. The reservoir is the part where a needle is inserted to give medicines or draw blood.   °· Catheter. The catheter is a thin, flexible tube that extends from the reservoir. The catheter is placed into a large vein. Medicine that is inserted into the reservoir goes into the catheter and then into the vein.   °HOW WILL I CARE FOR MY INCISION SITE? °Do not get the incision site wet. Bathe or shower as directed by your health care provider.  °HOW IS MY PORT ACCESSED? °Special steps must be taken to access the port:  °· Before the port is accessed, a numbing cream can be placed on the skin. This helps numb the skin over the port site.   °· Your health care provider uses a sterile technique to access the port. °· Your health care provider must put on a mask and sterile gloves. °· The skin over your port is cleaned carefully with an antiseptic and allowed to dry. °· The port is gently pinched between sterile gloves, and a needle is inserted into the port. °· Only "non-coring" port needles should be used to access the port. Once the port is accessed, a blood return should be checked. This helps  ensure that the port is in the vein and is not clogged.   °· If your port needs to remain accessed for a constant infusion, a clear (transparent) bandage will be placed over the needle site. The bandage and needle will need to be changed every week, or as directed by your health care provider.   °· Keep the bandage covering the needle clean and dry. Do not get it wet. Follow your health care provider's instructions on how to take a shower or bath while the port is accessed.   °· If your port does not need to stay accessed, no bandage is needed over the port.   °WHAT IS FLUSHING? °Flushing helps keep the port from getting clogged. Follow your health care provider's instructions on how and when to flush the port. Ports are usually flushed with saline solution or a medicine called heparin. The need for flushing will depend on how the port is used.  °· If the port is used for intermittent medicines or blood draws, the port will need to be flushed:   °· After medicines have been given.   °· After blood has been drawn.   °· As part of routine maintenance.   °· If a constant infusion is running, the port may not need to be flushed.   °HOW LONG WILL MY PORT STAY IMPLANTED? °The port can stay in for as long as your health care   provider thinks it is needed. When it is time for the port to come out, surgery will be done to remove it. The procedure is similar to the one performed when the port was put in.  °WHEN SHOULD I SEEK IMMEDIATE MEDICAL CARE? °When you have an implanted port, you should seek immediate medical care if:  °· You notice a bad smell coming from the incision site.   °· You have swelling, redness, or drainage at the incision site.   °· You have more swelling or pain at the port site or the surrounding area.   °· You have a fever that is not controlled with medicine. °Document Released: 12/26/2004 Document Revised: 10/16/2012 Document Reviewed: 09/02/2012 °[employer]® Patient Information ©2014 [employer]. °Moderate Sedation, Adult °Moderate sedation is given to help you relax or even sleep through a procedure. You may remain sleepy, be clumsy, or have poor balance for several hours following this procedure. Arrange for a responsible adult, family member, or friend to take you home. A responsible adult should stay with you for at least 24 hours or until the medicines have worn off. °· Do not participate in any activities where you could become injured for the next 24 hours, or until you feel normal again. Do not: °· Drive. °· Swim. °· Ride a bicycle. °· Operate heavy machinery. °· Cook. °· Use power tools. °· Climb ladders. °· Work at heights. °· Do not make important decisions or sign legal documents until you are improved. °· Vomiting may occur if you eat too soon. When you can drink without vomiting, try water, juice, or soup. Try solid foods if you feel little or no nausea. °· Only take over-the-counter or prescription medications for pain, discomfort, or fever as directed by your caregiver.If pain medications have been prescribed for you, ask your caregiver how soon it is safe to take them. °· Make sure you and your family fully understands everything about the medication given to you. Make sure you understand what side effects may occur. °· You should not drink alcohol, take sleeping pills, or medications that cause drowsiness for at least 24 hours. °· If you smoke, do not smoke alone. °· If you are feeling better, you may resume normal activities 24 hours after receiving sedation. °· Keep all appointments as scheduled. Follow all instructions. °· Ask questions if you do not understand. °SEEK MEDICAL CARE IF:  °· Your skin is pale or bluish in color. °· You continue to feel sick to your stomach (nauseous) or throw up (vomit). °· Your pain is getting worse and not helped by medication. °· You have bleeding or swelling. °· You are still sleepy or feeling clumsy after 24 hours. °SEEK IMMEDIATE MEDICAL CARE IF:   °· You develop a rash. °· You have difficulty breathing. °· You develop any type of allergic problem. °· You have a fever. °Document Released: 09/20/2000 Document Revised: 03/20/2011 Document Reviewed: 09/02/2012 °[employer]® Patient Information ©2014 [employer]. ° °

## 2013-01-23 ENCOUNTER — Ambulatory Visit (HOSPITAL_BASED_OUTPATIENT_CLINIC_OR_DEPARTMENT_OTHER): Payer: Medicare Other | Admitting: Hematology & Oncology

## 2013-01-23 ENCOUNTER — Other Ambulatory Visit (HOSPITAL_BASED_OUTPATIENT_CLINIC_OR_DEPARTMENT_OTHER): Payer: Medicare Other | Admitting: Lab

## 2013-01-23 ENCOUNTER — Encounter: Payer: Self-pay | Admitting: Hematology & Oncology

## 2013-01-23 ENCOUNTER — Ambulatory Visit (HOSPITAL_BASED_OUTPATIENT_CLINIC_OR_DEPARTMENT_OTHER): Payer: Medicare Other

## 2013-01-23 VITALS — BP 145/64 | HR 56 | Temp 97.6°F | Resp 20 | Wt 197.0 lb

## 2013-01-23 DIAGNOSIS — Z5111 Encounter for antineoplastic chemotherapy: Secondary | ICD-10-CM

## 2013-01-23 DIAGNOSIS — C450 Mesothelioma of pleura: Secondary | ICD-10-CM

## 2013-01-23 DIAGNOSIS — C384 Malignant neoplasm of pleura: Secondary | ICD-10-CM

## 2013-01-23 DIAGNOSIS — I4891 Unspecified atrial fibrillation: Secondary | ICD-10-CM

## 2013-01-23 LAB — CMP (CANCER CENTER ONLY)
ALBUMIN: 3.2 g/dL — AB (ref 3.3–5.5)
ALK PHOS: 89 U/L — AB (ref 26–84)
ALT(SGPT): 15 U/L (ref 10–47)
AST: 15 U/L (ref 11–38)
BUN: 16 mg/dL (ref 7–22)
CALCIUM: 9.9 mg/dL (ref 8.0–10.3)
CHLORIDE: 102 meq/L (ref 98–108)
CO2: 34 meq/L — AB (ref 18–33)
Creat: 1.1 mg/dl (ref 0.6–1.2)
GLUCOSE: 163 mg/dL — AB (ref 73–118)
Potassium: 3.8 mEq/L (ref 3.3–4.7)
SODIUM: 145 meq/L (ref 128–145)
Total Bilirubin: 0.5 mg/dl (ref 0.20–1.60)
Total Protein: 8.5 g/dL — ABNORMAL HIGH (ref 6.4–8.1)

## 2013-01-23 LAB — CBC WITH DIFFERENTIAL (CANCER CENTER ONLY)
BASO#: 0 10*3/uL (ref 0.0–0.2)
BASO%: 0 % (ref 0.0–2.0)
EOS ABS: 0 10*3/uL (ref 0.0–0.5)
EOS%: 0 % (ref 0.0–7.0)
HCT: 43.4 % (ref 38.7–49.9)
HEMOGLOBIN: 14.4 g/dL (ref 13.0–17.1)
LYMPH#: 0.7 10*3/uL — ABNORMAL LOW (ref 0.9–3.3)
LYMPH%: 6.8 % — ABNORMAL LOW (ref 14.0–48.0)
MCH: 32 pg (ref 28.0–33.4)
MCHC: 33.2 g/dL (ref 32.0–35.9)
MCV: 96 fL (ref 82–98)
MONO#: 0.1 10*3/uL (ref 0.1–0.9)
MONO%: 1.2 % (ref 0.0–13.0)
NEUT#: 9.1 10*3/uL — ABNORMAL HIGH (ref 1.5–6.5)
NEUT%: 92 % — ABNORMAL HIGH (ref 40.0–80.0)
Platelets: 156 10*3/uL (ref 145–400)
RBC: 4.5 10*6/uL (ref 4.20–5.70)
RDW: 14.2 % (ref 11.1–15.7)
WBC: 9.9 10*3/uL (ref 4.0–10.0)

## 2013-01-23 MED ORDER — SODIUM CHLORIDE 0.9 % IV SOLN
Freq: Once | INTRAVENOUS | Status: AC
  Administered 2013-01-23: 09:00:00 via INTRAVENOUS

## 2013-01-23 MED ORDER — ONDANSETRON 16 MG/50ML IVPB (CHCC)
16.0000 mg | Freq: Once | INTRAVENOUS | Status: AC
  Administered 2013-01-23: 16 mg via INTRAVENOUS

## 2013-01-23 MED ORDER — CYANOCOBALAMIN 1000 MCG/ML IJ SOLN
INTRAMUSCULAR | Status: AC
  Filled 2013-01-23: qty 1

## 2013-01-23 MED ORDER — SODIUM CHLORIDE 0.9 % IV SOLN
480.0000 mg | Freq: Once | INTRAVENOUS | Status: AC
  Administered 2013-01-23: 480 mg via INTRAVENOUS
  Filled 2013-01-23: qty 48

## 2013-01-23 MED ORDER — CYANOCOBALAMIN 1000 MCG/ML IJ SOLN
1000.0000 ug | INTRAMUSCULAR | Status: DC
  Administered 2013-01-23: 1000 ug via INTRAMUSCULAR

## 2013-01-23 MED ORDER — PROCHLORPERAZINE MALEATE 10 MG PO TABS: 10.0000 mg | ORAL_TABLET | Freq: Four times a day (QID) | ORAL | Status: DC | PRN

## 2013-01-23 MED ORDER — PEMETREXED DISODIUM CHEMO INJECTION 500 MG
400.0000 mg/m2 | Freq: Once | INTRAVENOUS | Status: AC
  Administered 2013-01-23: 825 mg via INTRAVENOUS
  Filled 2013-01-23: qty 33

## 2013-01-23 MED ORDER — ONDANSETRON HCL 8 MG PO TABS: 8.0000 mg | ORAL_TABLET | Freq: Two times a day (BID) | ORAL | Status: DC

## 2013-01-23 MED ORDER — DEXAMETHASONE SODIUM PHOSPHATE 20 MG/5ML IJ SOLN
INTRAMUSCULAR | Status: AC
Start: 2013-01-23 — End: 2013-01-23
  Filled 2013-01-23: qty 5

## 2013-01-23 MED ORDER — DEXAMETHASONE 4 MG PO TABS: ORAL_TABLET | ORAL | Status: DC

## 2013-01-23 MED ORDER — ONDANSETRON 16 MG/50ML IVPB (CHCC)
INTRAVENOUS | Status: AC
  Filled 2013-01-23: qty 16

## 2013-01-23 MED ORDER — SODIUM CHLORIDE 0.9 % IJ SOLN
10.0000 mL | INTRAMUSCULAR | Status: DC | PRN
  Administered 2013-01-23: 10 mL
  Filled 2013-01-23: qty 10

## 2013-01-23 MED ORDER — HEPARIN SOD (PORK) LOCK FLUSH 100 UNIT/ML IV SOLN
500.0000 [IU] | Freq: Once | INTRAVENOUS | Status: AC | PRN
  Administered 2013-01-23: 500 [IU]
  Filled 2013-01-23: qty 5

## 2013-01-23 MED ORDER — FOLIC ACID 1 MG PO TABS: 1.0000 mg | ORAL_TABLET | Freq: Every day | ORAL | Status: DC

## 2013-01-23 MED ORDER — LORAZEPAM 0.5 MG PO TABS: 0.5000 mg | ORAL_TABLET | Freq: Four times a day (QID) | ORAL | Status: DC | PRN

## 2013-01-23 MED ORDER — DEXAMETHASONE SODIUM PHOSPHATE 20 MG/5ML IJ SOLN
20.0000 mg | Freq: Once | INTRAMUSCULAR | Status: AC
  Administered 2013-01-23: 20 mg via INTRAVENOUS

## 2013-01-23 NOTE — Progress Notes (Signed)
This office note has been dictated.

## 2013-01-23 NOTE — Patient Instructions (Addendum)
[facility] Discharge Instructions for Patients Receiving Chemotherapy  Today you received the following chemotherapy agents Alimta and Carboplatin       To help prevent nausea and vomiting after your treatment, we encourage you to take your nausea medication  1) Zofran 8 mg by mouth 2 times daily.  Take two times a day starting the day after chemo (01/24/13) for 3 days.  Then you can take 2 times a day as needed for nausea and vomiting. 2) Decadron 4 mg by mouth.  Take 2 tablets  two times a day the day before Alimta chemo. Take 2 tabs two times a day starting the day after chemo for 3 days.  3) Ativan .5 mg every 6 hours as needed for nausea or vomiting   Folic Acid 1 mg by mouth daily.  This is to help alleviate side effects of the Alimta   If you develop nausea and vomiting that is not controlled by your nausea medication, call the clinic.   BELOW ARE SYMPTOMS THAT SHOULD BE REPORTED IMMEDIATELY:  *FEVER GREATER THAN 100.5 F  *CHILLS WITH OR WITHOUT FEVER  NAUSEA AND VOMITING THAT IS NOT CONTROLLED WITH YOUR NAUSEA MEDICATION  *UNUSUAL SHORTNESS OF BREATH  *UNUSUAL BRUISING OR BLEEDING  TENDERNESS IN MOUTH AND THROAT WITH OR WITHOUT PRESENCE OF ULCERS  *URINARY PROBLEMS  *BOWEL PROBLEMS  UNUSUAL RASH Items with * indicate a potential emergency and should be followed up as soon as possible.  Feel free to call the clinic you have any questions or concerns. The clinic phone number is [phone].              Carboplatin injection What is this medicine? CARBOPLATIN (KAR boe pla tin) is a chemotherapy drug. It targets fast dividing cells, like cancer cells, and causes these cells to die. This medicine is used to treat ovarian cancer and many other cancers. This medicine may be used for other purposes; ask your health care provider or pharmacist if you have questions. COMMON BRAND NAME(S): Paraplatin What should I tell my health care provider  before I take this medicine? They need to know if you have any of these conditions: -blood disorders -hearing problems -kidney disease -recent or ongoing radiation therapy -an unusual or allergic reaction to carboplatin, cisplatin, other chemotherapy, other medicines, foods, dyes, or preservatives -pregnant or trying to get pregnant -breast-feeding How should I use this medicine? This drug is usually given as an infusion into a vein. It is administered in a hospital or clinic by a specially trained health care professional. Talk to your pediatrician regarding the use of this medicine in children. Special care may be needed. Overdosage: If you think you have taken too much of this medicine contact a poison control center or emergency room at once. NOTE: This medicine is only for you. Do not share this medicine with others. What if I miss a dose? It is important not to miss a dose. Call your doctor or health care professional if you are unable to keep an appointment. What may interact with this medicine? -medicines for seizures -medicines to increase blood counts like filgrastim, pegfilgrastim, sargramostim -some antibiotics like amikacin, gentamicin, neomycin, streptomycin, tobramycin -vaccines Talk to your doctor or health care professional before taking any of these medicines: -acetaminophen -aspirin -ibuprofen -ketoprofen -naproxen This list may not describe all possible interactions. Give your health care provider a list of all the medicines, herbs, non-prescription drugs, or dietary supplements you use. Also tell them if  you smoke, drink alcohol, or use illegal drugs. Some items may interact with your medicine. What should I watch for while using this medicine? Your condition will be monitored carefully while you are receiving this medicine. You will need important blood work done while you are taking this medicine. This drug may make you feel generally unwell. This is not uncommon,  as chemotherapy can affect healthy cells as well as cancer cells. Report any side effects. Continue your course of treatment even though you feel ill unless your doctor tells you to stop. In some cases, you may be given additional medicines to help with side effects. Follow all directions for their use. Call your doctor or health care professional for advice if you get a fever, chills or sore throat, or other symptoms of a cold or flu. Do not treat yourself. This drug decreases your body's ability to fight infections. Try to avoid being around people who are sick. This medicine may increase your risk to bruise or bleed. Call your doctor or health care professional if you notice any unusual bleeding. Be careful brushing and flossing your teeth or using a toothpick because you may get an infection or bleed more easily. If you have any dental work done, tell your dentist you are receiving this medicine. Avoid taking products that contain aspirin, acetaminophen, ibuprofen, naproxen, or ketoprofen unless instructed by your doctor. These medicines may hide a fever. Do not become pregnant while taking this medicine. Women should inform their doctor if they wish to become pregnant or think they might be pregnant. There is a potential for serious side effects to an unborn child. Talk to your health care professional or pharmacist for more information. Do not breast-feed an infant while taking this medicine. What side effects may I notice from receiving this medicine? Side effects that you should report to your doctor or health care professional as soon as possible: -allergic reactions like skin rash, itching or hives, swelling of the face, lips, or tongue -signs of infection - fever or chills, cough, sore throat, pain or difficulty passing urine -signs of decreased platelets or bleeding - bruising, pinpoint red spots on the skin, black, tarry stools, nosebleeds -signs of decreased red blood cells - unusually weak  or tired, fainting spells, lightheadedness -breathing problems -changes in hearing -changes in vision -chest pain -high blood pressure -low blood counts - This drug may decrease the number of white blood cells, red blood cells and platelets. You may be at increased risk for infections and bleeding. -nausea and vomiting -pain, swelling, redness or irritation at the injection site -pain, tingling, numbness in the hands or feet -problems with balance, talking, walking -trouble passing urine or change in the amount of urine Side effects that usually do not require medical attention (report to your doctor or health care professional if they continue or are bothersome): -hair loss -loss of appetite -metallic taste in the mouth or changes in taste This list may not describe all possible side effects. Call your doctor for medical advice about side effects. You may report side effects to FDA at 1-800-FDA-1088. Where should I keep my medicine? This drug is given in a hospital or clinic and will not be stored at home. NOTE: This sheet is a summary. It may not cover all possible information. If you have questions about this medicine, talk to your doctor, pharmacist, or health care provider.  2014, Elsevier/Gold Standard. (2007-04-02 14:38:05)  Pemetrexed injection What is this medicine? PEMETREXED (PEM e TREX ed)  is a chemotherapy drug. This medicine affects cells that are rapidly growing, such as cancer cells and cells in your mouth and stomach. It is usually used to treat lung cancers like non-small cell lung cancer and mesothelioma. It may also be used to treat other cancers. This medicine may be used for other purposes; ask your health care provider or pharmacist if you have questions. COMMON BRAND NAME(S): Alimta What should I tell my health care provider before I take this medicine? They need to know if you have any of these conditions: -if you frequently drink alcohol containing  beverages -infection (especially a virus infection such as chickenpox, cold sores, or herpes) -kidney disease -liver disease -low blood counts, like low platelets, red bloods, or white blood cells -an unusual or allergic reaction to pemetrexed, mannitol, other medicines, foods, dyes, or preservatives -pregnant or trying to get pregnant -breast-feeding How should I use this medicine? This drug is given as an infusion into a vein. It is administered in a hospital or clinic by a specially trained health care professional. Talk to your pediatrician regarding the use of this medicine in children. Special care may be needed. Overdosage: If you think you have taken too much of this medicine contact a poison control center or emergency room at once. NOTE: This medicine is only for you. Do not share this medicine with others. What if I miss a dose? It is important not to miss your dose. Call your doctor or health care professional if you are unable to keep an appointment. What may interact with this medicine? -aspirin and aspirin-like medicines -medicines to increase blood counts like filgrastim, pegfilgrastim, sargramostim -methotrexate -NSAIDS, medicines for pain and inflammation, like ibuprofen or naproxen -probenecid -pyrimethamine -vaccines Talk to your doctor or health care professional before taking any of these medicines: -acetaminophen -aspirin -ibuprofen -ketoprofen -naproxen This list may not describe all possible interactions. Give your health care provider a list of all the medicines, herbs, non-prescription drugs, or dietary supplements you use. Also tell them if you smoke, drink alcohol, or use illegal drugs. Some items may interact with your medicine. What should I watch for while using this medicine? Visit your doctor for checks on your progress. This drug may make you feel generally unwell. This is not uncommon, as chemotherapy can affect healthy cells as well as cancer cells.  Report any side effects. Continue your course of treatment even though you feel ill unless your doctor tells you to stop. In some cases, you may be given additional medicines to help with side effects. Follow all directions for their use. Call your doctor or health care professional for advice if you get a fever, chills or sore throat, or other symptoms of a cold or flu. Do not treat yourself. This drug decreases your body's ability to fight infections. Try to avoid being around people who are sick. This medicine may increase your risk to bruise or bleed. Call your doctor or health care professional if you notice any unusual bleeding. Be careful brushing and flossing your teeth or using a toothpick because you may get an infection or bleed more easily. If you have any dental work done, tell your dentist you are receiving this medicine. Avoid taking products that contain aspirin, acetaminophen, ibuprofen, naproxen, or ketoprofen unless instructed by your doctor. These medicines may hide a fever. Call your doctor or health care professional if you get diarrhea or mouth sores. Do not treat yourself. To protect your kidneys, drink water or other fluids  as directed while you are taking this medicine. Men and women must use effective birth control while taking this medicine. You may also need to continue using effective birth control for a time after stopping this medicine. Do not become pregnant while taking this medicine. Tell your doctor right away if you think that you or your partner might be pregnant. There is a potential for serious side effects to an unborn child. Talk to your health care professional or pharmacist for more information. Do not breast-feed an infant while taking this medicine. This medicine may lower sperm counts. What side effects may I notice from receiving this medicine? Side effects that you should report to your doctor or health care professional as soon as possible: -allergic  reactions like skin rash, itching or hives, swelling of the face, lips, or tongue -low blood counts - this medicine may decrease the number of white blood cells, red blood cells and platelets. You may be at increased risk for infections and bleeding. -signs of infection - fever or chills, cough, sore throat, pain or difficulty passing urine -signs of decreased platelets or bleeding - bruising, pinpoint red spots on the skin, black, tarry stools, blood in the urine -signs of decreased red blood cells - unusually weak or tired, fainting spells, lightheadedness -breathing problems, like a dry cough -changes in emotions or moods -chest pain -confusion -diarrhea -high blood pressure -mouth or throat sores or ulcers -pain, swelling, warmth in the leg -pain on swallowing -swelling of the ankles, feet, hands -trouble passing urine or change in the amount of urine -vomiting -yellowing of the eyes or skin Side effects that usually do not require medical attention (report to your doctor or health care professional if they continue or are bothersome): -hair loss -loss of appetite -nausea -stomach upset This list may not describe all possible side effects. Call your doctor for medical advice about side effects. You may report side effects to FDA at 1-800-FDA-1088. Where should I keep my medicine? This drug is given in a hospital or clinic and will not be stored at home. NOTE: This sheet is a summary. It may not cover all possible information. If you have questions about this medicine, talk to your doctor, pharmacist, or health care provider.  2014, Elsevier/Gold Standard. (2007-07-30 13:24:03)

## 2013-01-24 NOTE — Progress Notes (Signed)
DIAGNOSES: 1. Mesothelioma of the right lung. 2. Factor V Leiden mutation-heterozygote. 3. Lupus anticoagulant positivity. 4. Deep venous thrombosis of the right leg. 5. 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 6. Papillary carcinoma of the thyroid.  CURRENT THERAPY: 1. The patient to start chemotherapy with carboplatin/Alimta. 2. Xarelto 20 mg p.o. daily.  INTERIM HISTORY:  We have Steve [name] come back.  We will start him on chemotherapy today.  He had a Port-A-Cath placed yesterday.  Since I last saw him, he is being doing pretty well.  He is on oxygen. He does have some shortness of breath, which may be a little bit worse. This could be from the mesothelioma on the right side.  It also could be from his progressive underlying pulmonary disease from smoking.  His appetite has been fairly good.  He has had no nausea or vomiting.  When we saw him back in December, his prealbumin was 16.1.  He has had no bleeding on the Xarelto.  He has had no problems with leg swelling.  He has had no rashes.  Overall, his performance status is ECOG 1-2.  PHYSICAL EXAMINATION:  General:  This is a well-developed, well- nourished, white gentleman, in no obvious distress.  Vital Signs: Temperature of 97.6, pulse 56, respiratory rate 20, blood pressure 145/64, weight is 197 pounds.  Head and Neck:  Normocephalic, atraumatic skull.  There are no ocular or oral lesions.  There are no palpable cervical or supraclavicular lymph nodes.  Lungs:  Some decreased breath sounds over on the right side.  He has fairly decent air movement on the left side.  Cardiac:  Regular rate and rhythm with an occasional extra beat.  He has a 1/6 systolic ejection murmur.  Abdomen:  Soft.  He has laparotomy scars.  He has a abdominal wall hernia, which is stable.  He has no fluid wave.  There is no palpable hepatosplenomegaly.  Back:  No tenderness over the spine, ribs, or hips.  Extremities:  No  clubbing, no cyanosis, or edema.  He has no venous cord in his legs.  Skin:  No rashes, ecchymosis, or petechia.  LABORATORY STUDIES:  White cell count is 9.9, hemoglobin 14.4, hematocrit 43.4, platelet count 156.  His glucose is 163.  BUN 16, creatinine 1.1.  IMPRESSION:  Steve [name] is a nice 80 year old gentleman, appears with multiple medical issues.  His main problem is the progressive mesothelioma.  We probably have had to undertake therapy for this.  He is not an operative candidate, given his other health issues.  We will go ahead and treat him with carboplatin and Alimta.  I think he should do okay with this.  We will go ahead and treat with two cycles of chemotherapy and then rescan him to see how things are looking.  He is on Xarelto for his hypercoagulable state.  He will stay on Xarelto indefinitely.  The IgA lambda MGUS could certainly be reflective of the mesothelioma.  We will go ahead and plan to get him back to see [place] in another 3 weeks for his next cycle of chemotherapy.  I spent a good half hour with him and his family.  They all were very, very nice.  I went over the side effects of chemotherapy with them.  I told him that he likely will lose his hair.  He may be fatigued.  He may have some nausea.  Hopefully, a positive with treatment is that he will quit smoking.  He agreed  to the treatment.  I told him that we could always stop, if he starts having problems.    ______________________________ Volanda Napoleon, M.D. PRE/MEDQ  D:  01/23/2013  T:  01/24/2013  Job:  [id number]

## 2013-01-31 ENCOUNTER — Encounter: Payer: Self-pay | Admitting: *Deleted

## 2013-01-31 NOTE — Progress Notes (Signed)
[facility] Psychosocial Distress Screening Clinical Social Work  Clinical Social Work was referred by distress screening protocol.  The patient scored a 5 on the Psychosocial Distress Thermometer which indicates moderate distress. Clinical Social Worker phoned Pt at home to assess for distress and other psychosocial needs. CSW left message for Pt to call CSW with any concerns.   Clinical Social Worker follow up needed: no  If yes, follow up plan: CSW will await return call and will be available to Pt on future visits/appointments.    Loren Racer, LCSW Clinical Social Worker [facility] Wednesday, Thursday and Friday Phone: [phone] Fax: [phone]

## 2013-02-01 ENCOUNTER — Other Ambulatory Visit: Payer: Self-pay | Admitting: Family Medicine

## 2013-02-05 ENCOUNTER — Other Ambulatory Visit: Payer: Medicare Other | Admitting: Lab

## 2013-02-05 ENCOUNTER — Ambulatory Visit: Payer: Medicare Other | Admitting: Hematology & Oncology

## 2013-02-05 ENCOUNTER — Ambulatory Visit: Payer: Medicare Other

## 2013-02-06 ENCOUNTER — Ambulatory Visit (INDEPENDENT_AMBULATORY_CARE_PROVIDER_SITE_OTHER): Payer: Medicare Other | Admitting: Physician Assistant

## 2013-02-06 ENCOUNTER — Encounter: Payer: Self-pay | Admitting: Physician Assistant

## 2013-02-06 ENCOUNTER — Ambulatory Visit (HOSPITAL_BASED_OUTPATIENT_CLINIC_OR_DEPARTMENT_OTHER)
Admission: RE | Admit: 2013-02-06 | Discharge: 2013-02-06 | Disposition: A | Discharge status: Home / Self Care | Payer: Medicare Other | Source: Ambulatory Visit | Attending: Physician Assistant | Admitting: Physician Assistant

## 2013-02-06 VITALS — BP 146/68 | HR 54 | Temp 97.6°F | Resp 24 | Ht 68.0 in | Wt 193.5 lb

## 2013-02-06 DIAGNOSIS — W19XXXA Unspecified fall, initial encounter: Secondary | ICD-10-CM | POA: Insufficient documentation

## 2013-02-06 DIAGNOSIS — J209 Acute bronchitis, unspecified: Secondary | ICD-10-CM

## 2013-02-06 DIAGNOSIS — R0781 Pleurodynia: Secondary | ICD-10-CM

## 2013-02-06 DIAGNOSIS — J4489 Other specified chronic obstructive pulmonary disease: Secondary | ICD-10-CM

## 2013-02-06 DIAGNOSIS — J449 Chronic obstructive pulmonary disease, unspecified: Secondary | ICD-10-CM

## 2013-02-06 DIAGNOSIS — R079 Chest pain, unspecified: Secondary | ICD-10-CM | POA: Insufficient documentation

## 2013-02-06 DIAGNOSIS — J90 Pleural effusion, not elsewhere classified: Secondary | ICD-10-CM | POA: Insufficient documentation

## 2013-02-06 DIAGNOSIS — J811 Chronic pulmonary edema: Secondary | ICD-10-CM | POA: Insufficient documentation

## 2013-02-06 MED ORDER — PREDNISONE 20 MG PO TABS: 40.0000 mg | ORAL_TABLET | Freq: Every day | ORAL | Status: DC

## 2013-02-06 MED ORDER — SULFAMETHOXAZOLE-TMP DS 800-160 MG PO TABS: 1.0000 | ORAL_TABLET | Freq: Two times a day (BID) | ORAL | Status: DC

## 2013-02-06 MED ORDER — HYDROCODONE-ACETAMINOPHEN 5-325 MG PO TABS: 1.0000 | ORAL_TABLET | Freq: Four times a day (QID) | ORAL | Status: DC | PRN

## 2013-02-06 NOTE — Patient Instructions (Addendum)
Please take medications as prescribed.  Go downstairs for imaging.  I will call you with your results.  Please increase fluid intake.  Rest.  Saline nasal spray.  Continue Spiriva.  For rib pain -- avoid overexertion or heavy lifting.  Vicodin for pain.

## 2013-02-06 NOTE — Progress Notes (Signed)
Pre visit review using our clinic review tool, if applicable. No additional management support is needed unless otherwise documented below in the visit note/SLS  

## 2013-02-07 ENCOUNTER — Other Ambulatory Visit: Payer: Self-pay | Admitting: *Deleted

## 2013-02-07 ENCOUNTER — Telehealth: Payer: Self-pay | Admitting: Physician Assistant

## 2013-02-07 MED ORDER — TIOTROPIUM BROMIDE MONOHYDRATE 18 MCG IN CAPS: 18.0000 ug | ORAL_CAPSULE | Freq: Every day | RESPIRATORY_TRACT | Status: DC

## 2013-02-07 NOTE — Telephone Encounter (Signed)
Please inform patient that his x-ray was negative for pneumonia but does show he has a small pleural effusion on the right side.  I would like to set him up with Dr. Joya Gaskins (Pulmonology) to follow this and because of patient's COPD and Mesothelioma.  I don't see where he has a Pulmonologist already.  Can we verify this?  If he doesn't I will make a referral.  He should continue care as discussed at last visit. I would like for him to follow-up in 2 weeks if he cannot get in with Pulmonology by that time, for repeat CXR.

## 2013-02-07 NOTE — Telephone Encounter (Signed)
Patient Unavailable; spoke with pt's wife, Jana Half; informed, understood & agreed/SLS

## 2013-02-09 DIAGNOSIS — J209 Acute bronchitis, unspecified: Secondary | ICD-10-CM | POA: Insufficient documentation

## 2013-02-09 DIAGNOSIS — R0781 Pleurodynia: Secondary | ICD-10-CM | POA: Insufficient documentation

## 2013-02-09 NOTE — Progress Notes (Signed)
Patient is an 80 y.o. caucasian male with PMH of COPD and Mesothelioma who presents to the clinic today c/o several days of productive cough, mild shortness of breath and fatigue.  Patient is unsure of fever.  Denies chills, sinus pressure, sinus pain, sore throat, ear pain or tooth pain.  Patient endorses some left-sided chest wall tenderness that is worse with inspiration.  Patient does endorse falling out of his car a few days ago while leaning down to pick up an envelope.  Patient fell onto his left side.  Denies lightheadedness or dizziness prior to fall.  Patient fell < 2 feet down.  Patient denies bruising.  Denies trauma or injury to his head.  Denies headache, change in vision or change in mentation.    Past Medical History  Diagnosis Date  . Prostate enlargement   . GERD (gastroesophageal reflux disease)   . AAA (abdominal aortic aneurysm)     stress test 09/14/10 [facility]  . Anxiety   . Recurrent upper respiratory infection (URI)     productive cough- white phlegm- no fever  . Hypertension     chest x ray 12/12 [facility]- repeated 06/06/11, EKG 11/12 [facility]  . DVT (deep venous thrombosis) 08/2011  . Pneumonia   . Bruises easily     takes Xarelto  . Depression   . Dysrhythmia     hx of atrial fib post op x 2 days   . Atrial fibrillation   . Mesothelioma   . Arthritis     knee and back  . Sleep apnea      12/12 sleep study,SEVERE per study  Dr Lamonte Sakai- states doesnt wear machine on regular basis- setting BiPAP 9/9  . H/O hiatal hernia   . COPD (chronic obstructive pulmonary disease)     patient uses oxygen 2L/Frank at nite   . Epistaxis 05/13/2012  . Olecranon bursitis of right elbow 06/28/2012    Current Outpatient Prescriptions on File Prior to Visit  Medication Sig Dispense Refill  . ALPRAZolam (XANAX) 0.25 MG tablet Take 0.25 mg by mouth at bedtime as needed for sleep.      . calcium carbonate (OS-CAL - DOSED IN MG OF ELEMENTAL CALCIUM) 1250 MG tablet Take 2 tablets by mouth 2 (two) times  daily.      . Cholecalciferol (VITAMIN D3) 1000 UNITS CAPS Take 1 capsule by mouth daily.      Marland Kitchen dexamethasone (DECADRON) 4 MG tablet Take by mouth daily. Take 1 day pre chemo Tx and 5 days after Tx.      Marland Kitchen dexamethasone (DECADRON) 4 MG tablet Take 1 tab two times a day the day before Alimta chemo. Take 2 tabs two times a day starting the day after chemo for 3 days.  30 tablet  1  . folic acid (FOLVITE) 1 MG tablet Take 1 tablet (1 mg total) by mouth daily. Take daily starting 5-7 days before Alimta chemotherapy. Continue until 21 days after Alimta completed.  100 tablet  3  . levothyroxine (SYNTHROID, LEVOTHROID) 150 MCG tablet Take 150 mcg by mouth daily before breakfast.      . LORazepam (ATIVAN) 0.5 MG tablet Take 1 tablet (0.5 mg total) by mouth every 6 (six) hours as needed (Nausea or vomiting).  30 tablet  0  . losartan (COZAAR) 25 MG tablet Take 25 mg by mouth daily.      . metoprolol tartrate (LOPRESSOR) 25 MG tablet Take 12.5 mg by mouth 2 (two) times daily.      Marland Kitchen  ondansetron (ZOFRAN) 8 MG tablet Take 1 tablet (8 mg total) by mouth 2 (two) times daily. Take two times a day starting the day after chemo for 3 days. Then take two times a day as needed for nausea or vomiting.  30 tablet  1  . prochlorperazine (COMPAZINE) 10 MG tablet Take 1 tablet (10 mg total) by mouth every 6 (six) hours as needed (Nausea or vomiting).  30 tablet  1  . Rivaroxaban (XARELTO) 20 MG TABS tablet Take 20 mg by mouth daily with supper.      . senna (SENOKOT) 8.6 MG TABS Take 2 tablets by mouth at bedtime.      . sertraline (ZOLOFT) 100 MG tablet Take 100 mg by mouth daily with breakfast.      . traMADol (ULTRAM) 50 MG tablet Take 50 mg by mouth every 6 (six) hours as needed for moderate pain or severe pain.      Marland Kitchen lidocaine-prilocaine (EMLA) cream Apply 1 application topically as needed.  30 g  3   No current facility-administered medications on file prior to visit.    Allergies  Allergen Reactions  . Lyrica  [Pregabalin] Hives and Itching    Family History  Problem Relation Age of Onset  . Arthritis Mother   . Heart failure Mother   . Hypertension Mother   . Diabetes Mother   . Heart failure Daughter     deceased age 67  . Other Daughter     deceased age 42 MVA    History   Social History  . Marital Status: Married    Spouse Name: N/A    Number of Children: 6  . Years of Education: N/A   Occupational History  . retired    Social History Main Topics  . Smoking status: Current Every Day Smoker -- 20.00 packs/day for 60 years    Types: Cigarettes  . Smokeless tobacco: Never Used     Comment: pt using electric cig  . Alcohol Use: No  . Drug Use: No  . Sexual Activity: Not Currently   Other Topics Concern  . None   Social History Narrative  . None   Review of Systems - See HPI.  All other ROS are negative.  Filed Vitals:   02/06/13 1458  BP: 146/68  Pulse: 54  Temp: 97.6 F (36.4 C)  Resp: 24   Physical Exam  Vitals reviewed. Constitutional: He is oriented to person, place, and time and well-developed, well-nourished, and in no distress.  HENT:  Head: Normocephalic and atraumatic.  Right Ear: External ear normal.  Left Ear: External ear normal.  Nose: Nose normal.  Mouth/Throat: Oropharynx is clear and moist. No oropharyngeal exudate.  Eyes: Conjunctivae and EOM are normal. Pupils are equal, round, and reactive to light.  Neck: Neck supple.  Cardiovascular: Normal rate, regular rhythm, normal heart sounds and intact distal pulses.   Pulmonary/Chest: Effort normal. No respiratory distress. He has wheezes. He exhibits tenderness.  Lymphadenopathy:    He has no cervical adenopathy.  Neurological: He is alert and oriented to person, place, and time. No cranial nerve deficit.  Skin: Skin is warm and dry. No rash noted.  Psychiatric: Affect normal.    Recent Results (from the past 2160 hour(s))  GLUCOSE, CAPILLARY     Status: None   Collection Time     12/03/12 11:55 AM      Result Value Range   Glucose-Capillary 94  70 - 99 mg/dL  CBC WITH DIFFERENTIAL (  [facility])     Status: Abnormal   Collection Time    12/10/12 10:27 AM      Result Value Range   WBC 8.8  4.0 - 10.0 10e3/uL   RBC 4.35  4.20 - 5.70 10e6/uL   HGB 13.9  13.0 - 17.1 g/dL   HCT 42.1  38.7 - 49.9 %   MCV 97  82 - 98 fL   MCH 32.0  28.0 - 33.4 pg   MCHC 33.0  32.0 - 35.9 g/dL   RDW 14.6  11.1 - 15.7 %   Platelets 126 (*) 145 - 400 10e3/uL   NEUT# 6.6 (*) 1.5 - 6.5 10e3/uL   LYMPH# 1.5  0.9 - 3.3 10e3/uL   MONO# 0.5  0.1 - 0.9 10e3/uL   Eosinophils Absolute 0.2  0.0 - 0.5 10e3/uL   BASO# 0.0  0.0 - 0.2 10e3/uL   NEUT% 74.8  40.0 - 80.0 %   LYMPH% 17.4  14.0 - 48.0 %   MONO% 5.8  0.0 - 13.0 %   EOS% 1.9  0.0 - 7.0 %   BASO% 0.1  0.0 - 2.0 %  PREALBUMIN     Status: Abnormal   Collection Time    12/10/12 10:27 AM      Result Value Range   Prealbumin 16.1 (*) 17.0 - 34.0 mg/dL  COMPREHENSIVE METABOLIC PANEL     Status: None   Collection Time    12/10/12 10:27 AM      Result Value Range   Sodium 142  135 - 145 mEq/L   Potassium 3.5  3.5 - 5.3 mEq/L   Chloride 105  96 - 112 mEq/L   CO2 29  19 - 32 mEq/L   Glucose, Bld 97  70 - 99 mg/dL   BUN 20  6 - 23 mg/dL   Creatinine, Ser 0.94  0.50 - 1.35 mg/dL   Total Bilirubin 0.5  0.3 - 1.2 mg/dL   Alkaline Phosphatase 79  39 - 117 U/L   AST 12  0 - 37 U/L   ALT 10  0 - 53 U/L   Total Protein 7.1  6.0 - 8.3 g/dL   Albumin 3.6  3.5 - 5.2 g/dL   Calcium 9.2  8.4 - 10.5 mg/dL  APTT     Status: None   Collection Time    01/22/13 10:25 AM      Result Value Range   aPTT 28  24 - 37 seconds  CBC     Status: None   Collection Time    01/22/13 10:25 AM      Result Value Range   WBC 8.2  4.0 - 10.5 K/uL   RBC 4.27  4.22 - 5.81 MIL/uL   Hemoglobin 13.9  13.0 - 17.0 g/dL   HCT 40.5  39.0 - 52.0 %   MCV 94.8  78.0 - 100.0 fL   MCH 32.6  26.0 - 34.0 pg   MCHC 34.3  30.0 - 36.0 g/dL   RDW 14.3  11.5 - 15.5 %    Platelets 151  150 - 400 K/uL  PROTIME-INR     Status: None   Collection Time    01/22/13 10:25 AM      Result Value Range   Prothrombin Time 12.3  11.6 - 15.2 seconds   INR 0.93  0.00 - 1.49  CBC WITH DIFFERENTIAL ([facility])     Status: Abnormal   Collection Time    01/23/13  8:08 AM      Result Value Range   WBC 9.9  4.0 - 10.0 10e3/uL   RBC 4.50  4.20 - 5.70 10e6/uL   HGB 14.4  13.0 - 17.1 g/dL   HCT 43.4  38.7 - 49.9 %   MCV 96  82 - 98 fL   MCH 32.0  28.0 - 33.4 pg   MCHC 33.2  32.0 - 35.9 g/dL   RDW 14.2  11.1 - 15.7 %   Platelets 156  145 - 400 10e3/uL   NEUT# 9.1 (*) 1.5 - 6.5 10e3/uL   LYMPH# 0.7 (*) 0.9 - 3.3 10e3/uL   MONO# 0.1  0.1 - 0.9 10e3/uL   Eosinophils Absolute 0.0  0.0 - 0.5 10e3/uL   BASO# 0.0  0.0 - 0.2 10e3/uL   NEUT% 92.0 (*) 40.0 - 80.0 %   LYMPH% 6.8 (*) 14.0 - 48.0 %   MONO% 1.2  0.0 - 13.0 %   EOS% 0.0  0.0 - 7.0 %   BASO% 0.0  0.0 - 2.0 %  COMPREHENSIVE METABOLIC PANEL ([facility] ONLY)     Status: Abnormal   Collection Time    01/23/13  8:08 AM      Result Value Range   Sodium 145  128 - 145 mEq/L   Potassium 3.8  3.3 - 4.7 mEq/L   Chloride 102  98 - 108 mEq/L   CO2 34 (*) 18 - 33 mEq/L   Glucose, Bld 163 (*) 73 - 118 mg/dL   BUN, Bld 16  7 - 22 mg/dL   Creat 1.1  0.6 - 1.2 mg/dl   Total Bilirubin 0.50  0.20 - 1.60 mg/dl   Alkaline Phosphatase 89 (*) 26 - 84 U/L   AST 15  11 - 38 U/L   ALT(SGPT) 15  10 - 47 U/L   Total Protein 8.5 (*) 6.4 - 8.1 g/dL   Albumin 3.2 (*) 3.3 - 5.5 g/dL   Calcium 9.9  8.0 - 10.3 mg/dL    Assessment/Plan: Acute bronchitis Rx Bactrim.  Rx prednisone burst.  Increase fluid intake.  Rest.  Plain Mucinex.  Continue inhalers as prescribed.  Humidifier in the bedroom. "Chest Tenderness" likely due to recent fall.  Will obtain CXR with attention to left ribs to r/o pneumonia and/or fracture.  Rib pain Will obtain CXR with attention to left ribs due to recent injury.  Topical Aspercreme, heating pad to  area.

## 2013-02-09 NOTE — Assessment & Plan Note (Signed)
Rx Bactrim.  Rx prednisone burst.  Increase fluid intake.  Rest.  Plain Mucinex.  Continue inhalers as prescribed.  Humidifier in the bedroom. "Chest Tenderness" likely due to recent fall.  Will obtain CXR with attention to left ribs to r/o pneumonia and/or fracture.

## 2013-02-09 NOTE — Assessment & Plan Note (Signed)
Will obtain CXR with attention to left ribs due to recent injury.  Topical Aspercreme, heating pad to area.

## 2013-02-10 ENCOUNTER — Encounter (HOSPITAL_BASED_OUTPATIENT_CLINIC_OR_DEPARTMENT_OTHER): Payer: Self-pay | Admitting: Emergency Medicine

## 2013-02-10 ENCOUNTER — Emergency Department (HOSPITAL_BASED_OUTPATIENT_CLINIC_OR_DEPARTMENT_OTHER): Payer: Medicare Other

## 2013-02-10 ENCOUNTER — Emergency Department (HOSPITAL_BASED_OUTPATIENT_CLINIC_OR_DEPARTMENT_OTHER)
Admission: EM | Admit: 2013-02-10 | Discharge: 2013-02-10 | Disposition: A | Discharge status: Home / Self Care | Payer: Medicare Other | Attending: Emergency Medicine | Admitting: Emergency Medicine

## 2013-02-10 ENCOUNTER — Other Ambulatory Visit: Payer: Self-pay | Admitting: Family Medicine

## 2013-02-10 DIAGNOSIS — R0602 Shortness of breath: Secondary | ICD-10-CM

## 2013-02-10 DIAGNOSIS — J441 Chronic obstructive pulmonary disease with (acute) exacerbation: Secondary | ICD-10-CM | POA: Insufficient documentation

## 2013-02-10 DIAGNOSIS — F17200 Nicotine dependence, unspecified, uncomplicated: Secondary | ICD-10-CM | POA: Insufficient documentation

## 2013-02-10 DIAGNOSIS — G4730 Sleep apnea, unspecified: Secondary | ICD-10-CM | POA: Insufficient documentation

## 2013-02-10 DIAGNOSIS — F329 Major depressive disorder, single episode, unspecified: Secondary | ICD-10-CM | POA: Insufficient documentation

## 2013-02-10 DIAGNOSIS — R079 Chest pain, unspecified: Secondary | ICD-10-CM

## 2013-02-10 DIAGNOSIS — Z8719 Personal history of other diseases of the digestive system: Secondary | ICD-10-CM | POA: Insufficient documentation

## 2013-02-10 DIAGNOSIS — F3289 Other specified depressive episodes: Secondary | ICD-10-CM | POA: Insufficient documentation

## 2013-02-10 DIAGNOSIS — R0789 Other chest pain: Secondary | ICD-10-CM | POA: Insufficient documentation

## 2013-02-10 DIAGNOSIS — Z79899 Other long term (current) drug therapy: Secondary | ICD-10-CM | POA: Insufficient documentation

## 2013-02-10 DIAGNOSIS — Z86718 Personal history of other venous thrombosis and embolism: Secondary | ICD-10-CM | POA: Insufficient documentation

## 2013-02-10 DIAGNOSIS — C3490 Malignant neoplasm of unspecified part of unspecified bronchus or lung: Secondary | ICD-10-CM | POA: Insufficient documentation

## 2013-02-10 DIAGNOSIS — Z7901 Long term (current) use of anticoagulants: Secondary | ICD-10-CM | POA: Insufficient documentation

## 2013-02-10 DIAGNOSIS — Z87448 Personal history of other diseases of urinary system: Secondary | ICD-10-CM | POA: Insufficient documentation

## 2013-02-10 DIAGNOSIS — M129 Arthropathy, unspecified: Secondary | ICD-10-CM | POA: Insufficient documentation

## 2013-02-10 DIAGNOSIS — M1710 Unilateral primary osteoarthritis, unspecified knee: Secondary | ICD-10-CM | POA: Insufficient documentation

## 2013-02-10 DIAGNOSIS — Z8701 Personal history of pneumonia (recurrent): Secondary | ICD-10-CM | POA: Insufficient documentation

## 2013-02-10 DIAGNOSIS — I4891 Unspecified atrial fibrillation: Secondary | ICD-10-CM | POA: Insufficient documentation

## 2013-02-10 DIAGNOSIS — I10 Essential (primary) hypertension: Secondary | ICD-10-CM | POA: Insufficient documentation

## 2013-02-10 DIAGNOSIS — Z9981 Dependence on supplemental oxygen: Secondary | ICD-10-CM | POA: Insufficient documentation

## 2013-02-10 DIAGNOSIS — IMO0002 Reserved for concepts with insufficient information to code with codable children: Secondary | ICD-10-CM | POA: Insufficient documentation

## 2013-02-10 DIAGNOSIS — F411 Generalized anxiety disorder: Secondary | ICD-10-CM | POA: Insufficient documentation

## 2013-02-10 LAB — BASIC METABOLIC PANEL
BUN: 20 mg/dL (ref 6–23)
CALCIUM: 8.5 mg/dL (ref 8.4–10.5)
CO2: 24 meq/L (ref 19–32)
Chloride: 103 mEq/L (ref 96–112)
Creatinine, Ser: 1.1 mg/dL (ref 0.50–1.35)
GFR calc Af Amer: 73 mL/min — ABNORMAL LOW (ref >90)
GFR calc non Af Amer: 63 mL/min — ABNORMAL LOW (ref >90)
Glucose, Bld: 130 mg/dL — ABNORMAL HIGH (ref 70–99)
POTASSIUM: 4.9 meq/L (ref 3.7–5.3)
SODIUM: 140 meq/L (ref 137–147)

## 2013-02-10 LAB — CBC
HCT: 35.5 % — ABNORMAL LOW (ref 39.0–52.0)
HEMOGLOBIN: 11.8 g/dL — AB (ref 13.0–17.0)
MCH: 32.2 pg (ref 26.0–34.0)
MCHC: 33.2 g/dL (ref 30.0–36.0)
MCV: 97 fL (ref 78.0–100.0)
PLATELETS: 171 10*3/uL (ref 150–400)
RBC: 3.66 MIL/uL — ABNORMAL LOW (ref 4.22–5.81)
RDW: 13.8 % (ref 11.5–15.5)
WBC: 5.5 10*3/uL (ref 4.0–10.5)

## 2013-02-10 LAB — TROPONIN I: Troponin I: <0.3 ng/mL (ref <0.30)

## 2013-02-10 LAB — PRO B NATRIURETIC PEPTIDE: PRO B NATRI PEPTIDE: 856.8 pg/mL — AB (ref 0–450)

## 2013-02-10 MED ORDER — HYDROCODONE-ACETAMINOPHEN 5-325 MG PO TABS: 1.0000 | ORAL_TABLET | Freq: Four times a day (QID) | ORAL | Status: DC | PRN

## 2013-02-10 MED ORDER — IPRATROPIUM-ALBUTEROL 0.5-2.5 (3) MG/3ML IN SOLN
RESPIRATORY_TRACT | Status: AC
  Administered 2013-02-10: 3 mL
  Filled 2013-02-10: qty 3

## 2013-02-10 MED ORDER — IOHEXOL 350 MG/ML SOLN
100.0000 mL | Freq: Once | INTRAVENOUS | Status: AC | PRN
  Administered 2013-02-10: 100 mL via INTRAVENOUS

## 2013-02-10 NOTE — ED Provider Notes (Signed)
CSN: [id number]     Arrival date & time 02/10/13  1714 History  This chart was scribed for Steve Shipper, MD by Celesta Gentile, ED Scribe. The patient was seen in room MH07/MH07. Patient's care was started at 5:45 PM.  Chief Complaint  Patient presents with  . Shortness of Breath   Patient is a 80 y.o. male presenting with chest pain. The history is provided by the patient and a relative. No language interpreter was used.  Chest Pain Pain location:  L chest Pain quality: aching and dull   Pain quality: not radiating   Pain radiates to:  Does not radiate Pain radiates to the back: no   Pain severity:  Moderate Onset quality:  Gradual Duration:  1 week Timing:  Constant Progression:  Worsening Chronicity:  New Context: breathing and at rest   Relieved by: pain medication. Worsened by:  Deep breathing Ineffective treatments:  None tried Associated symptoms: cough and shortness of breath   Associated symptoms: no abdominal pain, no back pain, no fever, no nausea, no numbness and not vomiting    HPI Comments: FAYETTE HAMADA is a 80 y.o. male with a h/o of COPD who presents to the Emergency Department complaining of constant left sided chest pain with associated worsening intermittent SOB that started about 7 days ago.  Pt states that he thought he had pneumonia last week, so Dr. Randel Pigg x-rayed his chest and showed fluid around his right lung, but no rib fracture.  They prescribed him antibiotics and pain medication.  Daughter called Dr. Randel Pigg today and told her pt was worsening and they were told to come here.  Pt describes the pain as constant, aching and dull, and he denies the pain radiating anywhere.  Pt reports that the pain is relieved with pain medication.  Pt also complains of a persistent cough productive of phlegm, but denies presence of blood.  Pt denies fever, nausea, emesis, and leg swelling.  Pt currently has right sided lung cancer, specifically mesothelioma.  Pt had to  have fluid drawn of the right lung previously.  He is currently in chemotherapy, last chemo last week.  Pt reports that he wears oxygen at night.  Pt is a smoker, but denies h/o of CHF and CAD.    Past Medical History  Diagnosis Date  . Prostate enlargement   . GERD (gastroesophageal reflux disease)   . AAA (abdominal aortic aneurysm)     stress test 09/14/10 [facility]  . Anxiety   . Recurrent upper respiratory infection (URI)     productive cough- white phlegm- no fever  . Hypertension     chest x ray 12/12 EP[facility]- repeated 06/06/11, EKG 11/12 [facility]  . DVT (deep venous thrombosis) 08/2011  . Pneumonia   . Bruises easily     takes Xarelto  . Depression   . Dysrhythmia     hx of atrial fib post op x 2 days   . Atrial fibrillation   . Mesothelioma   . Arthritis     knee and back  . Sleep apnea      12/12 sleep study,SEVERE per study  Dr Lamonte Sakai- states doesnt wear machine on regular basis- setting BiPAP 9/9  . H/O hiatal hernia   . COPD (chronic obstructive pulmonary disease)     patient uses oxygen 2L/[place] at nite   . Epistaxis 05/13/2012  . Olecranon bursitis of right elbow 06/28/2012   Past Surgical History  Procedure Laterality Date  . Knee surgery  1957    left knee arthotomy  . Abdominal aortic aneurysm repair  11/14/2010    AAA Repair    Dr Donnetta Hutching  . Transurethral resection of prostate  01/25/2011    TURP  . Incisional hernia repair  06/12/2011    Procedure: HERNIA REPAIR INCISIONAL;  Surgeon: Earnstine Regal, MD;  Location: [facility];  Service: General;  Laterality: N/A;  Open Repair Ventral Incisional Hernia with Mesh  . Hernia repair    . Video assisted thoracoscopy  11/02/2011    Procedure: VIDEO ASSISTED THORACOSCOPY;  Surgeon: Melrose Nakayama, MD;  Location: [place];  Service: Thoracic;  Laterality: Right;  . Pleural biopsy  11/02/2011    Procedure: PLEURAL BIOPSY;  Surgeon: Melrose Nakayama, MD;  Location: [place];  Service: Thoracic;  Laterality: Right;  Parietal and visceral  biopsies  . Chest tube insertion  11/02/2011    Procedure: INSERTION PLEURAL DRAINAGE CATHETER;  Surgeon: Melrose Nakayama, MD;  Location: [place];  Service: Thoracic;  Laterality: Right;  . Decortication  11/02/2011    Procedure: DECORTICATION;  Surgeon: Melrose Nakayama, MD;  Location: [place];  Service: Thoracic;  Laterality: Right;  . Pleural effusion drainage  11/02/2011    Procedure: DRAINAGE OF PLEURAL EFFUSION;  Surgeon: Melrose Nakayama, MD;  Location: [place];  Service: Thoracic;  Laterality: Right;  . Cholecystectomy  01/19/2012    Procedure: LAPAROSCOPIC CHOLECYSTECTOMY WITH INTRAOPERATIVE CHOLANGIOGRAM;  Surgeon: Zenovia Jarred, MD;  Location: [place];  Service: General;  Laterality: N/A;  laparoscopic cholecystectomy with intraoperative cholangiogram  . Ercp  01/22/2012    Procedure: ENDOSCOPIC RETROGRADE CHOLANGIOPANCREATOGRAPHY (ERCP);  Surgeon: Inda Castle, MD;  Location: [place];  Service: Endoscopy;  Laterality: N/A;  with stent placement  . Ercp  01/21/2012    Procedure: ENDOSCOPIC RETROGRADE CHOLANGIOPANCREATOGRAPHY (ERCP);  Surgeon: Milus Banister, MD;  Location: [place];  Service: Gastroenterology;  Laterality: N/A;  . Esophagogastroduodenoscopy  01/24/2012    Procedure: ESOPHAGOGASTRODUODENOSCOPY (EGD);  Surgeon: Inda Castle, MD;  Location: [place];  Service: Endoscopy;  Laterality: N/A;  remove pancr stent   . Endoscopic retrograde cholangiopancreatography (ercp) with propofol N/A 03/07/2012    Procedure: ENDOSCOPIC RETROGRADE CHOLANGIOPANCREATOGRAPHY (ERCP) WITH PROPOFOL;  Surgeon: Milus Banister, MD;  Location: [facility];  Service: Endoscopy;  Laterality: N/A;  remove stent  . Thyroidectomy N/A 04/04/2012    Procedure:  TOTAL THYROIDECTOMY WITH CENTRAL COMPARTMENT LYMPH NODE DISSECTION;  Surgeon: Earnstine Regal, MD;  Location: [facility];  Service: General;  Laterality: N/A;   Family History  Problem Relation Age of Onset  . Arthritis Mother   . Heart  failure Mother   . Hypertension Mother   . Diabetes Mother   . Heart failure Daughter     deceased age 91  . Other Daughter     deceased age 72 MVA   History  Substance Use Topics  . Smoking status: Current Every Day Smoker -- 20.00 packs/day for 60 years    Types: Cigarettes  . Smokeless tobacco: Never Used     Comment: pt using electric cig  . Alcohol Use: No    Review of Systems  Constitutional: Negative for fever and chills.  HENT: Negative for congestion, ear pain, rhinorrhea and sore throat.   Respiratory: Positive for cough and shortness of breath.   Cardiovascular: Positive for chest pain.  Gastrointestinal: Negative for nausea, vomiting, abdominal pain and diarrhea.  Musculoskeletal: Negative for back pain.  Skin: Negative for color  change and rash.  Neurological: Negative for syncope and numbness.  All other systems reviewed and are negative.    Allergies  Lyrica  Home Medications   Current Outpatient Rx  Name  Route  Sig  Dispense  Refill  . ALPRAZolam (XANAX) 0.25 MG tablet   Oral   Take 0.25 mg by mouth at bedtime as needed for sleep.         . calcium carbonate (OS-CAL - DOSED IN MG OF ELEMENTAL CALCIUM) 1250 MG tablet   Oral   Take 2 tablets by mouth 2 (two) times daily.         . Cholecalciferol (VITAMIN D3) 1000 UNITS CAPS   Oral   Take 1 capsule by mouth daily.         Marland Kitchen dexamethasone (DECADRON) 4 MG tablet   Oral   Take by mouth daily. Take 1 day pre chemo Tx and 5 days after Tx.         Marland Kitchen dexamethasone (DECADRON) 4 MG tablet      Take 1 tab two times a day the day before Alimta chemo. Take 2 tabs two times a day starting the day after chemo for 3 days.   30 tablet   1   . folic acid (FOLVITE) 1 MG tablet   Oral   Take 1 tablet (1 mg total) by mouth daily. Take daily starting 5-7 days before Alimta chemotherapy. Continue until 21 days after Alimta completed.   100 tablet   3   . HYDROcodone-acetaminophen (NORCO/VICODIN) 5-325  MG per tablet   Oral   Take 1 tablet by mouth every 6 (six) hours as needed for moderate pain.   30 tablet   0   . levothyroxine (SYNTHROID, LEVOTHROID) 150 MCG tablet   Oral   Take 150 mcg by mouth daily before breakfast.         . lidocaine-prilocaine (EMLA) cream   Topical   Apply 1 application topically as needed.   30 g   3   . LORazepam (ATIVAN) 0.5 MG tablet   Oral   Take 1 tablet (0.5 mg total) by mouth every 6 (six) hours as needed (Nausea or vomiting).   30 tablet   0   . losartan (COZAAR) 25 MG tablet   Oral   Take 25 mg by mouth daily.         . metoprolol tartrate (LOPRESSOR) 25 MG tablet   Oral   Take 12.5 mg by mouth 2 (two) times daily.         . ondansetron (ZOFRAN) 8 MG tablet   Oral   Take 1 tablet (8 mg total) by mouth 2 (two) times daily. Take two times a day starting the day after chemo for 3 days. Then take two times a day as needed for nausea or vomiting.   30 tablet   1   . predniSONE (DELTASONE) 20 MG tablet   Oral   Take 2 tablets (40 mg total) by mouth daily with breakfast.   10 tablet   0   . prochlorperazine (COMPAZINE) 10 MG tablet   Oral   Take 1 tablet (10 mg total) by mouth every 6 (six) hours as needed (Nausea or vomiting).   30 tablet   1   . Rivaroxaban (XARELTO) 20 MG TABS tablet   Oral   Take 20 mg by mouth daily with supper.         . senna (SENOKOT) 8.6 MG TABS  Oral   Take 2 tablets by mouth at bedtime.         . sertraline (ZOLOFT) 100 MG tablet   Oral   Take 100 mg by mouth daily with breakfast.         . sulfamethoxazole-trimethoprim (BACTRIM DS) 800-160 MG per tablet   Oral   Take 1 tablet by mouth 2 (two) times daily.   14 tablet   0   . tiotropium (SPIRIVA) 18 MCG inhalation capsule   Inhalation   Place 1 capsule (18 mcg total) into inhaler and inhale daily.   30 capsule   3   . traMADol (ULTRAM) 50 MG tablet   Oral   Take 50 mg by mouth every 6 (six) hours as needed for moderate  pain or severe pain.          Triage Vitals: BP 147/67  Pulse 60  Temp(Src) 97.7 F (36.5 C) (Oral)  Resp 28  Ht 5\' 8"  (1.727 m)  Wt 193 lb (87.544 kg)  BMI 29.35 kg/m2  SpO2 94% Physical Exam  Nursing note and vitals reviewed. Constitutional: He is oriented to person, place, and time. He appears well-developed and well-nourished. No distress.  HENT:  Head: Normocephalic and atraumatic.  Eyes: Conjunctivae and EOM are normal. Right eye exhibits no discharge. Left eye exhibits no discharge.  Neck: Neck supple. No tracheal deviation present.  Cardiovascular: Normal rate, regular rhythm and normal heart sounds.  Exam reveals no gallop and no friction rub.   No murmur heard. Pulmonary/Chest: Effort normal. No respiratory distress. He has decreased breath sounds (dullness to percussion in R base, mild egophony in R base) in the right lower field and the left lower field. He has wheezes in the right upper field, the left upper field and the left middle field. He has no rhonchi. He has no rales.  Abdominal: Soft.  Musculoskeletal: Normal range of motion.  Neurological: He is alert and oriented to person, place, and time.  Skin: Skin is warm and dry.  Psychiatric: He has a normal mood and affect. His behavior is normal.    ED Course  Procedures (including critical care time) DIAGNOSTIC STUDIES: Oxygen Saturation is 94% on RA, adequate by my interpretation.    COORDINATION OF CARE: 5:57 PM-Will order CT of chest. Patient informed of current plan of treatment and evaluation and agrees with plan.   Labs Review Labs Reviewed  CBC - Abnormal; Notable for the following:    RBC 3.66 (*)    Hemoglobin 11.8 (*)    HCT 35.5 (*)    All other components within normal limits  BASIC METABOLIC PANEL - Abnormal; Notable for the following:    Glucose, Bld 130 (*)    GFR calc non Af Amer 63 (*)    GFR calc Af Amer 73 (*)    All other components within normal limits  PRO B NATRIURETIC PEPTIDE  - Abnormal; Notable for the following:    Pro B Natriuretic peptide (BNP) 856.8 (*)    All other components within normal limits  TROPONIN I   Imaging Review Ct Angio Chest Pe W/cm &/or Wo Cm  02/10/2013   CLINICAL DATA:  Left-sided chest pain.  History of lung cancer.  EXAM: CT ANGIOGRAPHY CHEST WITH CONTRAST  TECHNIQUE: Multidetector CT imaging of the chest was performed using the standard protocol during bolus administration of intravenous contrast. Multiplanar CT image reconstructions including MIPs were obtained to evaluate the vascular anatomy.  CONTRAST:  150mL OMNIPAQUE  IOHEXOL 350 MG/ML SOLN  COMPARISON:  PET-CT from 12/03/2012.  FINDINGS: There is no filling defect within the opacified pulmonary arteries to suggest the presence of an acute pulmonary embolus.  The tip of the right-sided Port-A-Cath is in the mid SVC. There is no thoracic aortic aneurysm. No dissection of the thoracic aorta.  No axillary lymphadenopathy. No mediastinal or hilar lymphadenopathy. The heart size is normal. Coronary artery calcification is noted. No pericardial were left pleural effusion.  The lung windows reveal emphysema in the upper lobes. There is volume loss in the right hemi thorax with fluid in the right major fissure. There is some regular pleural thickening in the inferior right hemi thorax.  12 mm pulmonary nodule is identified in the central left lower lobe (image 52 series 6). 10 mm left upper lobe pulmonary nodule is seen on image 47.  Images which include the upper abdomen show small exophytic low-density lesions in the upper pole the right kidney which are incompletely visualized but likely represent cysts. Adrenal glands are unremarkable.  Bone windows reveal no worrisome lytic or sclerotic osseous lesions.  Review of the MIP images confirms the above findings.  IMPRESSION: No CT evidence for acute pulmonary embolus. No thoracic aneurysm or dissection.  Irregular pleural thickening with tiny right pleural  effusion. Neoplastic involvement is a concern. This was evaluated previously by PET-CT.  Two left lung nodules. These were visible on previous PET CTs from 12/03/2012 and 09/23/2012. These of not progressed in the interval.   Electronically Signed   By: Misty Stanley M.D.   On: 02/10/2013 19:28    EKG Interpretation    Date/Time:  Monday February 10 2013 18:07:03 EST Ventricular Rate:  56 PR Interval:    QRS Duration: 122 QT Interval:  452 QTC Calculation: 436 R Axis:   -28 Text Interpretation:  Wide QRS rhythm Left ventricular hypertrophy with QRS widening Nonspecific T wave abnormality Abnormal ECG T wave flattening diffusely, similar to previous Confirmed by Mingo Amber  MD, [place] ([id number]) on 02/10/2013 6:11:28 PM            MDM   1. Chest pain   2. Shortness of breath    29M with history of lung cancer, currently on chemotherapy for presents with left-sided chest pain. Constant left lateral lower chest pain.: Aching, nonradiating. History of DVT no history of PE. Seen by PCP, had a chest x-ray done last week which showed right pleural effusion and some mild vascular congestion diffusely. No history of CAD or CHF. Persistent pain today, instructed to come here for further evaluation. Patient denies any hemoptysis. He has had productive cough. He denies any fever or shortness of breath. On exam he has decreased lung sounds in the right base with some mild diffuse wheezing. Will give duo neb. Will scan for possible PE with his lateral chest wall pain and since he is actively on chemotherapy and has had history of clots. Also obtain EKG and cardiac labs. CT without PE, shows mesothelioma and small R lung effsion.  BNP elevated, hx of similar elevation. No signs concerning for acute heart failure exacerbation. CP history not consistent with cardiac pain.  Hx of R lung thoracentesis, no need for emergent thoracentesis tonight. Stable for discharge, given pain meds. Pain free upon discharge. Has  pulm f/u next week, instructed to f/u with PCP in 2-3 days.  I personally performed the services described in this documentation, which was scribed in my presence. The recorded information has been reviewed and  is accurate.     Steve Shipper, MD 02/10/13 [phone]

## 2013-02-10 NOTE — ED Notes (Signed)
Sob and pain in his left lung. States he had a CXR last week that showed fluid on his lung and gave him antibiotics and pain medication.

## 2013-02-10 NOTE — Discharge Instructions (Signed)
Chest Pain (Nonspecific) °It is often hard to give a specific diagnosis for the cause of chest pain. There is always a chance that your pain could be related to something serious, such as a heart attack or a blood clot in the lungs. You need to follow up with your caregiver for further evaluation. °CAUSES  °· Heartburn. °· Pneumonia or bronchitis. °· Anxiety or stress. °· Inflammation around your heart (pericarditis) or lung (pleuritis or pleurisy). °· A blood clot in the lung. °· A collapsed lung (pneumothorax). It can develop suddenly on its own (spontaneous pneumothorax) or from injury (trauma) to the chest. °· Shingles infection (herpes zoster virus). °The chest wall is composed of bones, muscles, and cartilage. Any of these can be the source of the pain. °· The bones can be bruised by injury. °· The muscles or cartilage can be strained by coughing or overwork. °· The cartilage can be affected by inflammation and become sore (costochondritis). °DIAGNOSIS  °Lab tests or other studies, such as X-rays, electrocardiography, stress testing, or cardiac imaging, may be needed to find the cause of your pain.  °TREATMENT  °· Treatment depends on what may be causing your chest pain. Treatment may include: °· Acid blockers for heartburn. °· Anti-inflammatory medicine. °· Pain medicine for inflammatory conditions. °· Antibiotics if an infection is present. °· You may be advised to change lifestyle habits. This includes stopping smoking and avoiding alcohol, caffeine, and chocolate. °· You may be advised to keep your head raised (elevated) when sleeping. This reduces the chance of acid going backward from your stomach into your esophagus. °· Most of the time, nonspecific chest pain will improve within 2 to 3 days with rest and mild pain medicine. °HOME CARE INSTRUCTIONS  °· If antibiotics were prescribed, take your antibiotics as directed. Finish them even if you start to feel better. °· For the next few days, avoid physical  activities that bring on chest pain. Continue physical activities as directed. °· Do not smoke. °· Avoid drinking alcohol. °· Only take over-the-counter or prescription medicine for pain, discomfort, or fever as directed by your caregiver. °· Follow your caregiver's suggestions for further testing if your chest pain does not go away. °· Keep any follow-up appointments you made. If you do not go to an appointment, you could develop lasting (chronic) problems with pain. If there is any problem keeping an appointment, you must call to reschedule. °SEEK MEDICAL CARE IF:  °· You think you are having problems from the medicine you are taking. Read your medicine instructions carefully. °· Your chest pain does not go away, even after treatment. °· You develop a rash with blisters on your chest. °SEEK IMMEDIATE MEDICAL CARE IF:  °· You have increased chest pain or pain that spreads to your arm, neck, jaw, back, or abdomen. °· You develop shortness of breath, an increasing cough, or you are coughing up blood. °· You have severe back or abdominal pain, feel nauseous, or vomit. °· You develop severe weakness, fainting, or chills. °· You have a fever. °THIS IS AN EMERGENCY. Do not wait to see if the pain will go away. Get medical help at once. Call your local emergency services (911 in [place] Do not drive yourself to the hospital. °MAKE SURE YOU:  °· Understand these instructions. °· Will watch your condition. °· Will get help right away if you are not doing well or get worse. °Document Released: 10/05/2004 Document Revised: 03/20/2011 Document Reviewed: 08/01/2007 °[employer]® Patient Information ©2014 [employer]   [employer]. ° °

## 2013-02-12 ENCOUNTER — Ambulatory Visit (HOSPITAL_BASED_OUTPATIENT_CLINIC_OR_DEPARTMENT_OTHER): Payer: Medicare Other | Admitting: Hematology & Oncology

## 2013-02-12 ENCOUNTER — Ambulatory Visit (HOSPITAL_BASED_OUTPATIENT_CLINIC_OR_DEPARTMENT_OTHER): Payer: Medicare Other

## 2013-02-12 ENCOUNTER — Other Ambulatory Visit (HOSPITAL_BASED_OUTPATIENT_CLINIC_OR_DEPARTMENT_OTHER): Payer: Medicare Other | Admitting: Lab

## 2013-02-12 ENCOUNTER — Encounter: Payer: Self-pay | Admitting: Hematology & Oncology

## 2013-02-12 VITALS — BP 114/44 | HR 60 | Temp 97.2°F | Resp 18 | Ht 69.0 in | Wt 194.0 lb

## 2013-02-12 DIAGNOSIS — D472 Monoclonal gammopathy: Secondary | ICD-10-CM

## 2013-02-12 DIAGNOSIS — C384 Malignant neoplasm of pleura: Secondary | ICD-10-CM

## 2013-02-12 DIAGNOSIS — C450 Mesothelioma of pleura: Secondary | ICD-10-CM

## 2013-02-12 DIAGNOSIS — Z5111 Encounter for antineoplastic chemotherapy: Secondary | ICD-10-CM

## 2013-02-12 DIAGNOSIS — F17200 Nicotine dependence, unspecified, uncomplicated: Secondary | ICD-10-CM

## 2013-02-12 DIAGNOSIS — D6859 Other primary thrombophilia: Secondary | ICD-10-CM

## 2013-02-12 LAB — CBC WITH DIFFERENTIAL (CANCER CENTER ONLY)
BASO#: 0 10*3/uL (ref 0.0–0.2)
BASO%: 0 % (ref 0.0–2.0)
EOS%: 0.1 % (ref 0.0–7.0)
Eosinophils Absolute: 0 10*3/uL (ref 0.0–0.5)
HEMATOCRIT: 36.1 % — AB (ref 38.7–49.9)
HEMOGLOBIN: 11.9 g/dL — AB (ref 13.0–17.1)
LYMPH#: 1 10*3/uL (ref 0.9–3.3)
LYMPH%: 15 % (ref 14.0–48.0)
MCH: 32 pg (ref 28.0–33.4)
MCHC: 33 g/dL (ref 32.0–35.9)
MCV: 97 fL (ref 82–98)
MONO#: 0.3 10*3/uL (ref 0.1–0.9)
MONO%: 4.1 % (ref 0.0–13.0)
NEUT#: 5.5 10*3/uL (ref 1.5–6.5)
NEUT%: 80.8 % — AB (ref 40.0–80.0)
Platelets: 213 10*3/uL (ref 145–400)
RBC: 3.72 10*6/uL — AB (ref 4.20–5.70)
RDW: 14.2 % (ref 11.1–15.7)
WBC: 6.9 10*3/uL (ref 4.0–10.0)

## 2013-02-12 LAB — CMP (CANCER CENTER ONLY)
ALT(SGPT): 23 U/L (ref 10–47)
AST: 19 U/L (ref 11–38)
Albumin: 3 g/dL — ABNORMAL LOW (ref 3.3–5.5)
Alkaline Phosphatase: 71 U/L (ref 26–84)
BUN, Bld: 20 mg/dL (ref 7–22)
CO2: 28 meq/L (ref 18–33)
CREATININE: 1.2 mg/dL (ref 0.6–1.2)
Calcium: 8.3 mg/dL (ref 8.0–10.3)
Chloride: 105 mEq/L (ref 98–108)
Glucose, Bld: 134 mg/dL — ABNORMAL HIGH (ref 73–118)
Potassium: 3.8 mEq/L (ref 3.3–4.7)
Sodium: 142 mEq/L (ref 128–145)
Total Bilirubin: 0.4 mg/dl (ref 0.20–1.60)
Total Protein: 7.5 g/dL (ref 6.4–8.1)

## 2013-02-12 MED ORDER — SODIUM CHLORIDE 0.9 % IV SOLN
400.0000 mg/m2 | Freq: Once | INTRAVENOUS | Status: AC
  Administered 2013-02-12: 825 mg via INTRAVENOUS
  Filled 2013-02-12: qty 33

## 2013-02-12 MED ORDER — HEPARIN SOD (PORK) LOCK FLUSH 100 UNIT/ML IV SOLN
500.0000 [IU] | Freq: Once | INTRAVENOUS | Status: AC | PRN
  Administered 2013-02-12: 500 [IU]
  Filled 2013-02-12: qty 5

## 2013-02-12 MED ORDER — DEXAMETHASONE SODIUM PHOSPHATE 20 MG/5ML IJ SOLN
INTRAMUSCULAR | Status: AC
  Filled 2013-02-12: qty 5

## 2013-02-12 MED ORDER — SODIUM CHLORIDE 0.9 % IV SOLN
Freq: Once | INTRAVENOUS | Status: AC
Start: 2013-02-12 — End: 2013-02-12
  Administered 2013-02-12: 11:00:00 via INTRAVENOUS

## 2013-02-12 MED ORDER — SODIUM CHLORIDE 0.9 % IV SOLN
480.0000 mg | Freq: Once | INTRAVENOUS | Status: AC
  Administered 2013-02-12: 480 mg via INTRAVENOUS
  Filled 2013-02-12: qty 48

## 2013-02-12 MED ORDER — DEXAMETHASONE SODIUM PHOSPHATE 20 MG/5ML IJ SOLN
20.0000 mg | Freq: Once | INTRAMUSCULAR | Status: AC
  Administered 2013-02-12: 20 mg via INTRAVENOUS

## 2013-02-12 MED ORDER — SODIUM CHLORIDE 0.9 % IJ SOLN
10.0000 mL | INTRAMUSCULAR | Status: DC | PRN
  Administered 2013-02-12: 10 mL
  Filled 2013-02-12: qty 10

## 2013-02-12 MED ORDER — ONDANSETRON 16 MG/50ML IVPB (CHCC)
16.0000 mg | Freq: Once | INTRAVENOUS | Status: AC
  Administered 2013-02-12: 16 mg via INTRAVENOUS

## 2013-02-12 MED ORDER — ONDANSETRON 16 MG/50ML IVPB (CHCC)
INTRAVENOUS | Status: AC
  Filled 2013-02-12: qty 16

## 2013-02-12 NOTE — Patient Instructions (Signed)
[facility] Discharge Instructions for Patients Receiving Chemotherapy  Today you received the following chemotherapy agents Alimta and Carboplatin       To help prevent nausea and vomiting after your treatment, we encourage you to take your nausea medication  If you develop nausea and vomiting that is not controlled by your nausea medication, call the clinic.   BELOW ARE SYMPTOMS THAT SHOULD BE REPORTED IMMEDIATELY:  *FEVER GREATER THAN 100.5 F  *CHILLS WITH OR WITHOUT FEVER  NAUSEA AND VOMITING THAT IS NOT CONTROLLED WITH YOUR NAUSEA MEDICATION  *UNUSUAL SHORTNESS OF BREATH  *UNUSUAL BRUISING OR BLEEDING  TENDERNESS IN MOUTH AND THROAT WITH OR WITHOUT PRESENCE OF ULCERS  *URINARY PROBLEMS  *BOWEL PROBLEMS  UNUSUAL RASH Items with * indicate a potential emergency and should be followed up as soon as possible.  Feel free to call the clinic you have any questions or concerns. The clinic phone number is [phone].  Carboplatin injection What is this medicine? CARBOPLATIN (KAR boe pla tin) is a chemotherapy drug. It targets fast dividing cells, like cancer cells, and causes these cells to die. This medicine is used to treat ovarian cancer and many other cancers. This medicine may be used for other purposes; ask your health care provider or pharmacist if you have questions. COMMON BRAND NAME(S): Paraplatin What should I tell my health care provider before I take this medicine? They need to know if you have any of these conditions: -blood disorders -hearing problems -kidney disease -recent or ongoing radiation therapy -an unusual or allergic reaction to carboplatin, cisplatin, other chemotherapy, other medicines, foods, dyes, or preservatives -pregnant or trying to get pregnant -breast-feeding How should I use this medicine? This drug is usually given as an infusion into a vein. It is administered in a hospital or clinic by a specially trained health care  professional. Talk to your pediatrician regarding the use of this medicine in children. Special care may be needed. Overdosage: If you think you have taken too much of this medicine contact a poison control center or emergency room at once. NOTE: This medicine is only for you. Do not share this medicine with others. What if I miss a dose? It is important not to miss a dose. Call your doctor or health care professional if you are unable to keep an appointment. What may interact with this medicine? -medicines for seizures -medicines to increase blood counts like filgrastim, pegfilgrastim, sargramostim -some antibiotics like amikacin, gentamicin, neomycin, streptomycin, tobramycin -vaccines Talk to your doctor or health care professional before taking any of these medicines: -acetaminophen -aspirin -ibuprofen -ketoprofen -naproxen This list may not describe all possible interactions. Give your health care provider a list of all the medicines, herbs, non-prescription drugs, or dietary supplements you use. Also tell them if you smoke, drink alcohol, or use illegal drugs. Some items may interact with your medicine. What should I watch for while using this medicine? Your condition will be monitored carefully while you are receiving this medicine. You will need important blood work done while you are taking this medicine. This drug may make you feel generally unwell. This is not uncommon, as chemotherapy can affect healthy cells as well as cancer cells. Report any side effects. Continue your course of treatment even though you feel ill unless your doctor tells you to stop. In some cases, you may be given additional medicines to help with side effects. Follow all directions for their use. Call your doctor or health care professional for advice  if you get a fever, chills or sore throat, or other symptoms of a cold or flu. Do not treat yourself. This drug decreases your body's ability to fight infections.  Try to avoid being around people who are sick. This medicine may increase your risk to bruise or bleed. Call your doctor or health care professional if you notice any unusual bleeding. Be careful brushing and flossing your teeth or using a toothpick because you may get an infection or bleed more easily. If you have any dental work done, tell your dentist you are receiving this medicine. Avoid taking products that contain aspirin, acetaminophen, ibuprofen, naproxen, or ketoprofen unless instructed by your doctor. These medicines may hide a fever. Do not become pregnant while taking this medicine. Women should inform their doctor if they wish to become pregnant or think they might be pregnant. There is a potential for serious side effects to an unborn child. Talk to your health care professional or pharmacist for more information. Do not breast-feed an infant while taking this medicine. What side effects may I notice from receiving this medicine? Side effects that you should report to your doctor or health care professional as soon as possible: -allergic reactions like skin rash, itching or hives, swelling of the face, lips, or tongue -signs of infection - fever or chills, cough, sore throat, pain or difficulty passing urine -signs of decreased platelets or bleeding - bruising, pinpoint red spots on the skin, black, tarry stools, nosebleeds -signs of decreased red blood cells - unusually weak or tired, fainting spells, lightheadedness -breathing problems -changes in hearing -changes in vision -chest pain -high blood pressure -low blood counts - This drug may decrease the number of white blood cells, red blood cells and platelets. You may be at increased risk for infections and bleeding. -nausea and vomiting -pain, swelling, redness or irritation at the injection site -pain, tingling, numbness in the hands or feet -problems with balance, talking, walking -trouble passing urine or change in the  amount of urine Side effects that usually do not require medical attention (report to your doctor or health care professional if they continue or are bothersome): -hair loss -loss of appetite -metallic taste in the mouth or changes in taste This list may not describe all possible side effects. Call your doctor for medical advice about side effects. You may report side effects to FDA at 1-800-FDA-1088. Where should I keep my medicine? This drug is given in a hospital or clinic and will not be stored at home. NOTE: This sheet is a summary. It may not cover all possible information. If you have questions about this medicine, talk to your doctor, pharmacist, or health care provider.  2014, Elsevier/Gold Standard. (2007-04-02 14:38:05)  Pemetrexed injection What is this medicine? PEMETREXED (PEM e TREX ed) is a chemotherapy drug. This medicine affects cells that are rapidly growing, such as cancer cells and cells in your mouth and stomach. It is usually used to treat lung cancers like non-small cell lung cancer and mesothelioma. It may also be used to treat other cancers. This medicine may be used for other purposes; ask your health care provider or pharmacist if you have questions. COMMON BRAND NAME(S): Alimta What should I tell my health care provider before I take this medicine? They need to know if you have any of these conditions: -if you frequently drink alcohol containing beverages -infection (especially a virus infection such as chickenpox, cold sores, or herpes) -kidney disease -liver disease -low blood counts, like low  platelets, red bloods, or white blood cells -an unusual or allergic reaction to pemetrexed, mannitol, other medicines, foods, dyes, or preservatives -pregnant or trying to get pregnant -breast-feeding How should I use this medicine? This drug is given as an infusion into a vein. It is administered in a hospital or clinic by a specially trained health care  professional. Talk to your pediatrician regarding the use of this medicine in children. Special care may be needed. Overdosage: If you think you have taken too much of this medicine contact a poison control center or emergency room at once. NOTE: This medicine is only for you. Do not share this medicine with others. What if I miss a dose? It is important not to miss your dose. Call your doctor or health care professional if you are unable to keep an appointment. What may interact with this medicine? -aspirin and aspirin-like medicines -medicines to increase blood counts like filgrastim, pegfilgrastim, sargramostim -methotrexate -NSAIDS, medicines for pain and inflammation, like ibuprofen or naproxen -probenecid -pyrimethamine -vaccines Talk to your doctor or health care professional before taking any of these medicines: -acetaminophen -aspirin -ibuprofen -ketoprofen -naproxen This list may not describe all possible interactions. Give your health care provider a list of all the medicines, herbs, non-prescription drugs, or dietary supplements you use. Also tell them if you smoke, drink alcohol, or use illegal drugs. Some items may interact with your medicine. What should I watch for while using this medicine? Visit your doctor for checks on your progress. This drug may make you feel generally unwell. This is not uncommon, as chemotherapy can affect healthy cells as well as cancer cells. Report any side effects. Continue your course of treatment even though you feel ill unless your doctor tells you to stop. In some cases, you may be given additional medicines to help with side effects. Follow all directions for their use. Call your doctor or health care professional for advice if you get a fever, chills or sore throat, or other symptoms of a cold or flu. Do not treat yourself. This drug decreases your body's ability to fight infections. Try to avoid being around people who are sick. This  medicine may increase your risk to bruise or bleed. Call your doctor or health care professional if you notice any unusual bleeding. Be careful brushing and flossing your teeth or using a toothpick because you may get an infection or bleed more easily. If you have any dental work done, tell your dentist you are receiving this medicine. Avoid taking products that contain aspirin, acetaminophen, ibuprofen, naproxen, or ketoprofen unless instructed by your doctor. These medicines may hide a fever. Call your doctor or health care professional if you get diarrhea or mouth sores. Do not treat yourself. To protect your kidneys, drink water or other fluids as directed while you are taking this medicine. Men and women must use effective birth control while taking this medicine. You may also need to continue using effective birth control for a time after stopping this medicine. Do not become pregnant while taking this medicine. Tell your doctor right away if you think that you or your partner might be pregnant. There is a potential for serious side effects to an unborn child. Talk to your health care professional or pharmacist for more information. Do not breast-feed an infant while taking this medicine. This medicine may lower sperm counts. What side effects may I notice from receiving this medicine? Side effects that you should report to your doctor or health care professional  as soon as possible: -allergic reactions like skin rash, itching or hives, swelling of the face, lips, or tongue -low blood counts - this medicine may decrease the number of white blood cells, red blood cells and platelets. You may be at increased risk for infections and bleeding. -signs of infection - fever or chills, cough, sore throat, pain or difficulty passing urine -signs of decreased platelets or bleeding - bruising, pinpoint red spots on the skin, black, tarry stools, blood in the urine -signs of decreased red blood cells -  unusually weak or tired, fainting spells, lightheadedness -breathing problems, like a dry cough -changes in emotions or moods -chest pain -confusion -diarrhea -high blood pressure -mouth or throat sores or ulcers -pain, swelling, warmth in the leg -pain on swallowing -swelling of the ankles, feet, hands -trouble passing urine or change in the amount of urine -vomiting -yellowing of the eyes or skin Side effects that usually do not require medical attention (report to your doctor or health care professional if they continue or are bothersome): -hair loss -loss of appetite -nausea -stomach upset This list may not describe all possible side effects. Call your doctor for medical advice about side effects. You may report side effects to FDA at 1-800-FDA-1088. Where should I keep my medicine? This drug is given in a hospital or clinic and will not be stored at home. NOTE: This sheet is a summary. It may not cover all possible information. If you have questions about this medicine, talk to your doctor, pharmacist, or health care provider.  2014, [employer]vier/Gold Standard. (2007-07-30 13:24:03)

## 2013-02-12 NOTE — Progress Notes (Signed)
This office note has been dictated.

## 2013-02-12 NOTE — Patient Instructions (Signed)
You Can Quit Smoking If you are ready to quit smoking or are thinking about it, congratulations! You have chosen to help yourself be healthier and live longer! There are lots of different ways to quit smoking. Nicotine gum, nicotine patches, a nicotine inhaler, or nicotine nasal spray can help with physical craving. Hypnosis, support groups, and medicines help break the habit of smoking. TIPS TO GET OFF AND STAY OFF CIGARETTES  Learn to predict your moods. Do not let a bad situation be your excuse to have a cigarette. Some situations in your life might tempt you to have a cigarette.  Ask friends and co-workers not to smoke around you.  Make your home smoke-free.  Never have "just one" cigarette. It leads to wanting another and another. Remind yourself of your decision to quit.  On a card, make a list of your reasons for not smoking. Read it at least the same number of times a day as you have a cigarette. Tell yourself everyday, "I do not want to smoke. I choose not to smoke."  Ask someone at home or work to help you with your plan to quit smoking.  Have something planned after you eat or have a cup of coffee. Take a walk or get other exercise to perk you up. This will help to keep you from overeating.  Try a relaxation exercise to calm you down and decrease your stress. Remember, you may be tense and nervous the first two weeks after you quit. This will pass.  Find new activities to keep your hands busy. Play with a pen, coin, or rubber band. Doodle or draw things on paper.  Brush your teeth right after eating. This will help cut down the craving for the taste of tobacco after meals. You can try mouthwash too.  Try gum, breath mints, or diet candy to keep something in your mouth. IF YOU SMOKE AND WANT TO QUIT:  Do not stock up on cigarettes. Never buy a carton. Wait until one pack is finished before you buy another.  Never carry cigarettes with you at work or at home.  Keep cigarettes  as far away from you as possible. Leave them with someone else.  Never carry matches or a lighter with you.  Ask yourself, "Do I need this cigarette or is this just a reflex?"  Bet with someone that you can quit. Put cigarette money in a piggy bank every morning. If you smoke, you give up the money. If you do not smoke, by the end of the week, you keep the money.  Keep trying. It takes 21 days to change a habit!  Talk to your doctor about using medicines to help you quit. These include nicotine replacement gum, lozenges, or skin patches. Document Released: 10/22/2008 Document Revised: 03/20/2011 Document Reviewed: 10/22/2008 [employer] Patient Information 2014 [employer].  

## 2013-02-13 NOTE — Progress Notes (Signed)
DIAGNOSES: 1. 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 2. Heterozygote factor V Leiden mutation. 3. Lupus anticoagulant positive. 4. Deep venous thrombosis of the right leg. 5. 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 6. Papillary carcinoma of the thyroid.  CURRENT THERAPY: 1. The patient status post cycle 1 of carboplatin/Alimta. 2. Xarelto 20 mg p.o. daily.  INTERIM HISTORY:  [name] comes in for followup.  He has tolerated the first cycle of chemotherapy pretty well.  He did have a fall last week.  He slipped.  He went to the emergency room.  He had pain in the left chest wall.  He did not have any fracture.  He had a CT scan done. The CT scan did not show any pulmonary embolism.  He had no obvious progression or spread of the mesothelioma.  He is still smoking 2 packs a day.  He has had no leg problems.  He has had no nausea or vomiting.  There has been no change in bowel or bladder habits.  Overall, his performance status is ECOG 1.  PHYSICAL EXAMINATION:  General:  This is a elderly white gentleman, in no obvious distress.  Vital Signs:  Temperature of 97.2, pulse 60, respiratory rate 18, blood pressure 114/44.  Weight is 194 pounds.  Head and Neck:  Normocephalic, atraumatic skull.  There are no ocular or oral lesions.  There are no palpable cervical or supraclavicular lymph nodes. He has a well-healed surgical scar from his thyroid surgery.  His lungs show some decreased breath sounds over the right lung field.  Left lung field is cleared.  He has some occasional crackles bilaterally. Cardiac:  Regular rate and rhythm with a normal S1, S2.  There are no murmurs, rubs, or bruits.  Abdomen:  Soft.  He has good bowel sounds. There is no fluid wave.  There is no palpable hepatosplenomegaly.  Back: No tenderness over the spine, ribs, or hips.  Extremities:  No clubbing, cyanosis, or edema.  No venous cord noted in the legs.  He has good range  motion of his joints.  He has good strength in his legs.  Skin: No rashes, ecchymoses or petechia.  Neurological:  No focal neurological deficit.  LABORATORY STUDIES:  White cell count 6.9, hemoglobin 11.9, hematocrit 36.1, platelet count 213.  BUN 20, creatinine 1.2.  IMPRESSION:  [name] is a nice 80 year old gentleman.  He has advanced mesothelioma of the right lung.  He is not an operative candidate.  We thought of radiation, but he is not a candidate for this either.  He has had 1 cycle of chemo.  We will go ahead with the second cycle of chemotherapy today.  I will plan for a followup PET scan in about 3 weeks time, so we see if there is a response.  Otherwise, he seems to be holding his own.  I think he is doing pretty well from an anticoagulant point of view with the Xarelto.  I will plan to get back in 3 weeks time.  If all looks good on the PET scan, we will continue with chemotherapy.  If not, then he does not have to consider supportive type of care.    ______________________________ Volanda Napoleon, M.D. PRE/MEDQ  D:  02/12/2013  T:  02/13/2013  Job:  [id number]

## 2013-02-14 ENCOUNTER — Ambulatory Visit: Payer: Medicare Other | Admitting: Physician Assistant

## 2013-02-14 DIAGNOSIS — Z0289 Encounter for other administrative examinations: Secondary | ICD-10-CM

## 2013-02-18 ENCOUNTER — Ambulatory Visit: Payer: Medicare Other | Admitting: Emergency Medicine

## 2013-02-19 ENCOUNTER — Ambulatory Visit: Payer: Medicare Other | Admitting: Family

## 2013-02-24 ENCOUNTER — Ambulatory Visit: Payer: Medicare Other | Admitting: Emergency Medicine

## 2013-02-26 ENCOUNTER — Ambulatory Visit: Payer: Medicare Other | Admitting: Hematology & Oncology

## 2013-02-26 ENCOUNTER — Other Ambulatory Visit: Payer: Medicare Other | Admitting: Lab

## 2013-02-26 ENCOUNTER — Ambulatory Visit: Payer: Medicare Other

## 2013-03-01 ENCOUNTER — Other Ambulatory Visit: Payer: Self-pay | Admitting: Family Medicine

## 2013-03-01 NOTE — Telephone Encounter (Signed)
Sertraline refill sent to pharmacy.  Please call pt to arrange follow up with Dr Charlett Blake within the next 3 months.

## 2013-03-03 ENCOUNTER — Encounter (HOSPITAL_COMMUNITY): Payer: Self-pay

## 2013-03-03 ENCOUNTER — Encounter (HOSPITAL_COMMUNITY)
Admission: RE | Admit: 2013-03-03 | Discharge: 2013-03-03 | Disposition: A | Discharge status: Home / Self Care | Payer: Medicare Other | Source: Ambulatory Visit | Attending: Hematology & Oncology | Admitting: Hematology & Oncology

## 2013-03-03 DIAGNOSIS — C384 Malignant neoplasm of pleura: Secondary | ICD-10-CM | POA: Insufficient documentation

## 2013-03-03 DIAGNOSIS — C450 Mesothelioma of pleura: Secondary | ICD-10-CM

## 2013-03-03 LAB — GLUCOSE, CAPILLARY: Glucose-Capillary: 81 mg/dL (ref 70–99)

## 2013-03-03 MED ORDER — FLUDEOXYGLUCOSE F - 18 (FDG) INJECTION
10.1000 | Freq: Once | INTRAVENOUS | Status: AC | PRN
  Administered 2013-03-03: 10.1 via INTRAVENOUS

## 2013-03-03 NOTE — Telephone Encounter (Signed)
Left detailed message of refill and that patient needs to call our office to schedule 3 month follow up

## 2013-03-04 ENCOUNTER — Ambulatory Visit: Payer: Medicare Other | Admitting: Emergency Medicine

## 2013-03-05 ENCOUNTER — Other Ambulatory Visit: Payer: Self-pay | Admitting: Family Medicine

## 2013-03-05 ENCOUNTER — Encounter: Payer: Self-pay | Admitting: Hematology & Oncology

## 2013-03-05 ENCOUNTER — Ambulatory Visit (HOSPITAL_BASED_OUTPATIENT_CLINIC_OR_DEPARTMENT_OTHER): Payer: Medicare Other

## 2013-03-05 ENCOUNTER — Other Ambulatory Visit (HOSPITAL_BASED_OUTPATIENT_CLINIC_OR_DEPARTMENT_OTHER): Payer: Medicare Other | Admitting: Lab

## 2013-03-05 ENCOUNTER — Ambulatory Visit (HOSPITAL_BASED_OUTPATIENT_CLINIC_OR_DEPARTMENT_OTHER): Payer: Medicare Other | Admitting: Hematology & Oncology

## 2013-03-05 VITALS — BP 129/52 | HR 59 | Temp 97.4°F | Resp 20 | Ht 69.0 in | Wt 196.0 lb

## 2013-03-05 DIAGNOSIS — C450 Mesothelioma of pleura: Secondary | ICD-10-CM

## 2013-03-05 DIAGNOSIS — C384 Malignant neoplasm of pleura: Secondary | ICD-10-CM

## 2013-03-05 DIAGNOSIS — R059 Cough, unspecified: Secondary | ICD-10-CM

## 2013-03-05 DIAGNOSIS — F17200 Nicotine dependence, unspecified, uncomplicated: Secondary | ICD-10-CM

## 2013-03-05 DIAGNOSIS — Z5111 Encounter for antineoplastic chemotherapy: Secondary | ICD-10-CM

## 2013-03-05 DIAGNOSIS — R05 Cough: Secondary | ICD-10-CM

## 2013-03-05 LAB — CBC WITH DIFFERENTIAL (CANCER CENTER ONLY)
BASO#: 0 10*3/uL (ref 0.0–0.2)
BASO%: 0 % (ref 0.0–2.0)
EOS%: 0.9 % (ref 0.0–7.0)
Eosinophils Absolute: 0 10*3/uL (ref 0.0–0.5)
HCT: 28.2 % — ABNORMAL LOW (ref 38.7–49.9)
HEMOGLOBIN: 9.1 g/dL — AB (ref 13.0–17.1)
LYMPH#: 0.8 10*3/uL — AB (ref 0.9–3.3)
LYMPH%: 17.4 % (ref 14.0–48.0)
MCH: 31.9 pg (ref 28.0–33.4)
MCHC: 32.3 g/dL (ref 32.0–35.9)
MCV: 99 fL — ABNORMAL HIGH (ref 82–98)
MONO#: 0.3 10*3/uL (ref 0.1–0.9)
MONO%: 6.7 % (ref 0.0–13.0)
NEUT#: 3.2 10*3/uL (ref 1.5–6.5)
NEUT%: 75 % (ref 40.0–80.0)
Platelets: 130 10*3/uL — ABNORMAL LOW (ref 145–400)
RBC: 2.85 10*6/uL — ABNORMAL LOW (ref 4.20–5.70)
RDW: 13.6 % (ref 11.1–15.7)
WBC: 4.3 10*3/uL (ref 4.0–10.0)

## 2013-03-05 LAB — CMP (CANCER CENTER ONLY)
ALK PHOS: 90 U/L — AB (ref 26–84)
ALT: 18 U/L (ref 10–47)
AST: 20 U/L (ref 11–38)
Albumin: 2.5 g/dL — ABNORMAL LOW (ref 3.3–5.5)
BUN, Bld: 19 mg/dL (ref 7–22)
CO2: 29 mEq/L (ref 18–33)
Calcium: 8.6 mg/dL (ref 8.0–10.3)
Chloride: 103 mEq/L (ref 98–108)
Creat: 0.5 mg/dl — ABNORMAL LOW (ref 0.6–1.2)
Glucose, Bld: 151 mg/dL — ABNORMAL HIGH (ref 73–118)
POTASSIUM: 3.9 meq/L (ref 3.3–4.7)
Sodium: 140 mEq/L (ref 128–145)
TOTAL PROTEIN: 7 g/dL (ref 6.4–8.1)
Total Bilirubin: 0.4 mg/dl (ref 0.20–1.60)

## 2013-03-05 LAB — PREALBUMIN: PREALBUMIN: 10.2 mg/dL — AB (ref 17.0–34.0)

## 2013-03-05 MED ORDER — CYANOCOBALAMIN 1000 MCG/ML IJ SOLN
INTRAMUSCULAR | Status: AC
  Filled 2013-03-05: qty 1

## 2013-03-05 MED ORDER — DEXAMETHASONE SODIUM PHOSPHATE 20 MG/5ML IJ SOLN
20.0000 mg | Freq: Once | INTRAMUSCULAR | Status: AC
  Administered 2013-03-05: 20 mg via INTRAVENOUS

## 2013-03-05 MED ORDER — SODIUM CHLORIDE 0.9 % IV SOLN
Freq: Once | INTRAVENOUS | Status: AC
  Administered 2013-03-05: 12:00:00 via INTRAVENOUS

## 2013-03-05 MED ORDER — ONDANSETRON 16 MG/50ML IVPB (CHCC)
16.0000 mg | Freq: Once | INTRAVENOUS | Status: AC
  Administered 2013-03-05: 16 mg via INTRAVENOUS

## 2013-03-05 MED ORDER — ONDANSETRON 16 MG/50ML IVPB (CHCC)
INTRAVENOUS | Status: AC
  Filled 2013-03-05: qty 16

## 2013-03-05 MED ORDER — PEMETREXED DISODIUM CHEMO INJECTION 500 MG
360.0000 mg/m2 | Freq: Once | INTRAVENOUS | Status: AC
  Administered 2013-03-05: 750 mg via INTRAVENOUS
  Filled 2013-03-05: qty 30

## 2013-03-05 MED ORDER — SODIUM CHLORIDE 0.9 % IV SOLN
480.0000 mg | Freq: Once | INTRAVENOUS | Status: AC
  Administered 2013-03-05: 480 mg via INTRAVENOUS
  Filled 2013-03-05: qty 48

## 2013-03-05 MED ORDER — SODIUM CHLORIDE 0.9 % IJ SOLN
10.0000 mL | INTRAMUSCULAR | Status: DC | PRN
  Administered 2013-03-05: 10 mL
  Filled 2013-03-05: qty 10

## 2013-03-05 MED ORDER — CYANOCOBALAMIN 1000 MCG/ML IJ SOLN
1000.0000 ug | INTRAMUSCULAR | Status: DC
  Administered 2013-03-05: 1000 ug via INTRAMUSCULAR

## 2013-03-05 MED ORDER — DEXAMETHASONE SODIUM PHOSPHATE 20 MG/5ML IJ SOLN
INTRAMUSCULAR | Status: AC
  Filled 2013-03-05: qty 5

## 2013-03-05 MED ORDER — HEPARIN SOD (PORK) LOCK FLUSH 100 UNIT/ML IV SOLN
500.0000 [IU] | Freq: Once | INTRAVENOUS | Status: AC | PRN
  Administered 2013-03-05: 500 [IU]
  Filled 2013-03-05: qty 5

## 2013-03-05 NOTE — Patient Instructions (Signed)
[facility] Discharge Instructions for Patients Receiving Chemotherapy  Today you received the following chemotherapy agents Carboplatin, Alimta  To help prevent nausea and vomiting after your treatment, we encourage you to take your nausea medication    If you develop nausea and vomiting that is not controlled by your nausea medication, call the clinic.   BELOW ARE SYMPTOMS THAT SHOULD BE REPORTED IMMEDIATELY:  *FEVER GREATER THAN 100.5 F  *CHILLS WITH OR WITHOUT FEVER  NAUSEA AND VOMITING THAT IS NOT CONTROLLED WITH YOUR NAUSEA MEDICATION  *UNUSUAL SHORTNESS OF BREATH  *UNUSUAL BRUISING OR BLEEDING  TENDERNESS IN MOUTH AND THROAT WITH OR WITHOUT PRESENCE OF ULCERS  *URINARY PROBLEMS  *BOWEL PROBLEMS  UNUSUAL RASH Items with * indicate a potential emergency and should be followed up as soon as possible.  Feel free to call the clinic you have any questions or concerns. The clinic phone number is [phone].

## 2013-03-05 NOTE — Patient Instructions (Signed)
Smoking Cessation, Tips for Success If you are ready to quit smoking, congratulations! You have chosen to help yourself be healthier. Cigarettes bring nicotine, tar, carbon monoxide, and other irritants into your body. Your lungs, heart, and blood vessels will be able to work better without these poisons. There are many different ways to quit smoking. Nicotine gum, nicotine patches, a nicotine inhaler, or nicotine nasal spray can help with physical craving. Hypnosis, support groups, and medicines help break the habit of smoking. WHAT THINGS CAN I DO TO MAKE QUITTING EASIER?  Here are some tips to help you quit for good:  Pick a date when you will quit smoking completely. Tell all of your friends and family about your plan to quit on that date.  Do not try to slowly cut down on the number of cigarettes you are smoking. Pick a quit date and quit smoking completely starting on that day.  Throw away all cigarettes.   Clean and remove all ashtrays from your home, work, and car.   On a card, write down your reasons for quitting. Carry the card with you and read it when you get the urge to smoke.   Cleanse your body of nicotine. Drink enough water and fluids to keep your urine clear or pale yellow. Do this after quitting to flush the nicotine from your body.   Learn to predict your moods. Do not let a bad situation be your excuse to have a cigarette. Some situations in your life might tempt you into wanting a cigarette.   Never have "just one" cigarette. It leads to wanting another and another. Remind yourself of your decision to quit.   Change habits associated with smoking. If you smoked while driving or when feeling stressed, try other activities to replace smoking. Stand up when drinking your coffee. Brush your teeth after eating. Sit in a different chair when you read the paper. Avoid alcohol while trying to quit, and try to drink fewer caffeinated beverages. Alcohol and caffeine may urge  you to smoke.   Avoid foods and drinks that can trigger a desire to smoke, such as sugary or spicy foods and alcohol.   Ask people who smoke not to smoke around you.   Have something planned to do right after eating or having a cup of coffee. For example, plan to take a walk or exercise.   Try a relaxation exercise to calm you down and decrease your stress. Remember, you may be tense and nervous for the first 2 weeks after you quit, but this will pass.   Find new activities to keep your hands busy. Play with a pen, coin, or rubber band. Doodle or draw things on paper.   Brush your teeth right after eating. This will help cut down on the craving for the taste of tobacco after meals. You can also try mouthwash.   Use oral substitutes in place of cigarettes. Try using lemon drops, carrots, cinnamon sticks, or chewing gum. Keep them handy so they are available when you have the urge to smoke.   When you have the urge to smoke, try deep breathing.   Designate your home as a nonsmoking area.   If you are a heavy smoker, ask your health care provider about a prescription for nicotine chewing gum. It can ease your withdrawal from nicotine.   Reward yourself. Set aside the cigarette money you save and buy yourself something nice.   Look for support from others. Join a support group or   smoking cessation program. Ask someone at home or at work to help you with your plan to quit smoking.   Always ask yourself, "Do I need this cigarette or is this just a reflex?" Tell yourself, "Today, I choose not to smoke," or "I do not want to smoke." You are reminding yourself of your decision to quit.  Do not replace cigarette smoking with electronic cigarettes (commonly called e-cigarettes). The safety of e-cigarettes is unknown, and some may contain harmful chemicals.  If you relapse, do not give up! Plan ahead and think about what you will do the next time you get the urge to smoke.  HOW WILL  I FEEL WHEN I QUIT SMOKING? You may have symptoms of withdrawal because your body is used to nicotine (the addictive substance in cigarettes). You may crave cigarettes, be irritable, feel very hungry, cough often, get headaches, or have difficulty concentrating. The withdrawal symptoms are only temporary. They are strongest when you first quit but will go away within 10 14 days. When withdrawal symptoms occur, stay in control. Think about your reasons for quitting. Remind yourself that these are signs that your body is healing and getting used to being without cigarettes. Remember that withdrawal symptoms are easier to treat than the major diseases that smoking can cause.  Even after the withdrawal is over, expect periodic urges to smoke. However, these cravings are generally short lived and will go away whether you smoke or not. Do not smoke!  WHAT RESOURCES ARE AVAILABLE TO HELP ME QUIT SMOKING? Your health care provider can direct you to community resources or hospitals for support, which may include:  Group support.  Education.  Hypnosis.  Therapy. Document Released: 09/24/2003 Document Revised: 10/16/2012 Document Reviewed: 06/13/2012 ExitCare Patient Information 2014 [employer].  

## 2013-03-05 NOTE — Progress Notes (Signed)
  DIAGNOSIS: 1. 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 2. Heterozygote factor V Leiden mutation. 3. Lupus anticoagulant positive. 4. Deep venous thrombosis of the right leg. 5. 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 6. Papillary carcinoma of the thyroid.   CURRENT THERAPY:  Status post 2 cycles of carboplatin/Alimta  Xarelto 20 mg by mouth daily   INTERIM HISTORY:  Mr. Dupuis comes in for followup. He is looking pretty good. He actually tolerated chemotherapy pretty well. His wife said that he was a little weak last week. He did have some episodes of falling.    He's had no shortness of breath. He's had a little bit of a cough. He still smoking probably 2 packs a day of cigarettes.    We did repeat his PET scan. It shows that he's had a nice response to treatment. There is decreased metabolic activity where he has his mesothelioma.    Pain is not a problem. He's had no bleeding. He's had a little constipation.    Overall, his performance status is ECOG 1   PHYSICAL EXAMINATION: Elderly but fairly well-nourished white gentleman in no obvious distress. Vital signs show temperature of 97.4. Pulse 59. Blood pressure 129/52. Weight 196. Head and neck exam shows no ocular or oral lesions. He has no mucositis. No adenopathy is noted. He has a well-healed thyroid scar from surgery. Lungs show some wheezes bilaterally. These are chronic. She has good air movement bilaterally. Cardiac exam regular rate rhythm with normal S1-S2. There are no murmurs rubs or bruits. Abdomen is soft. Has good bowel sounds. There is no fluid wave. There is no palpable hepato- splenomegaly extremities shows no clubbing cyanosis or edema. Muscle strength is 4+/5. Skin exam shows some scattered ecchymoses. Neurological exam no focal neurological deficits.  LABORATORY STUDIES:  White cell count 4.3. Hemoglobin 9.1. Platelet count 1:30.   IMPRESSION:  Mr. Fendley is a  80 year old gentleman with a mesothelioma. He is responding to treatment. His performance status is holding up okay.    We will go ahead with his third cycle today.    I will probably plan for 6 cycles total and then we can follow.    I may make a dosage adjustment for him.    His Xarelto is doing okay. I don't see any issues with thromboembolic disease.    Are reviewed his scans with his family. They are very pleased with the results so far.   Volanda Napoleon, MD 03/05/2013

## 2013-03-07 ENCOUNTER — Telehealth: Payer: Self-pay | Admitting: Family Medicine

## 2013-03-07 NOTE — Telephone Encounter (Signed)
Relevant patient education assigned to patient using Emmi. ° °

## 2013-03-17 ENCOUNTER — Other Ambulatory Visit: Payer: Self-pay | Admitting: Nurse Practitioner

## 2013-03-17 DIAGNOSIS — C73 Malignant neoplasm of thyroid gland: Secondary | ICD-10-CM

## 2013-03-17 DIAGNOSIS — C450 Mesothelioma of pleura: Secondary | ICD-10-CM

## 2013-03-17 MED ORDER — CEFDINIR 300 MG PO CAPS: 300.0000 mg | ORAL_CAPSULE | Freq: Two times a day (BID) | ORAL | Status: DC

## 2013-03-17 NOTE — Telephone Encounter (Signed)
Pt's daughter called and stated he was having some coughing and congestion. Coughing up some green colored mucous. Per Dr. Marin Olp a 5 day script for Carole Civil has been called in and he has been instructed to contact the office if conditions worsens.

## 2013-03-24 ENCOUNTER — Ambulatory Visit (INDEPENDENT_AMBULATORY_CARE_PROVIDER_SITE_OTHER): Payer: Medicare Other | Admitting: Emergency Medicine

## 2013-03-24 ENCOUNTER — Encounter: Payer: Self-pay | Admitting: Emergency Medicine

## 2013-03-24 VITALS — BP 128/60 | HR 78 | Ht 71.0 in | Wt 194.0 lb

## 2013-03-24 DIAGNOSIS — J439 Emphysema, unspecified: Secondary | ICD-10-CM

## 2013-03-24 DIAGNOSIS — J438 Other emphysema: Secondary | ICD-10-CM

## 2013-03-24 MED ORDER — ALBUTEROL SULFATE (2.5 MG/3ML) 0.083% IN NEBU: 2.5000 mg | INHALATION_SOLUTION | Freq: Four times a day (QID) | RESPIRATORY_TRACT | Status: DC | PRN

## 2013-03-24 MED ORDER — ALBUTEROL SULFATE (2.5 MG/3ML) 0.083% IN NEBU: 2.5000 mg | INHALATION_SOLUTION | Freq: Four times a day (QID) | RESPIRATORY_TRACT | Status: AC | PRN

## 2013-03-24 MED ORDER — BUDESONIDE-FORMOTEROL FUMARATE 160-4.5 MCG/ACT IN AERO: 2.0000 | INHALATION_SPRAY | Freq: Two times a day (BID) | RESPIRATORY_TRACT | Status: AC

## 2013-03-24 NOTE — Progress Notes (Signed)
Subjective:    Patient ID: Steve [name], male    DOB: 10-30-1933, 80 y.o.   MRN: [id number]  HPI 80 year-old male with a known history of COPD (former smoker) and  AAA w/ planned aneurysm repair on 11/14/2010. Patient had postop complications with respiratory acidosis and hypoxemia requiring intubation.  Critical care team was consulted  11/28/2010 Post hospital  Pt returns for post hospital followup. Patient was hospitalized November through November 12 for planned abdominal aortic aneurysm repair. Patient had postop complications with respiratory acidosis and hypoxemia requiring intubation and vent support. Critical care team with initial consult. Patient had known underlying COPD and was on home O2 followed by his primary care doctor. Patient was treated with aggressive pulmonary hygiene along with IV antibiotics and diuresis and was successfully extubated on November 8. Patient was started on Spiriva. Echocardiogram during admission showed normal left ventricular function. Patient did have atrial fibrillation during hospitalization. He was started on metoprolol and converted over to normal sinus rhythm prior to discharge. This was felt likely to stress response and hypokalemia. Patient's Norvasc was held prior to discharge. Patient does complain that he believes he has sleep apnea and wants to be tested for this. Patient's wife says that he snores  and stops breathing intermittently. He complains of daytime hypersomnolence  -tired all the time.   Since discharge. Patient says he is slowly improving. He denies any cough, chest pain, wheezing, or increased leg swelling. Continues to feel extremely weak. It wears out easily with shortness of breath with activity.  ROV 12/29/10 --80yo  (> 120 pk-yrs),  follows up after acute episode hypercapneic resp failure while hospitalized post-AAA repair 11/12. He established care here 11/12, was seen by [place] in house. Had PFT today = normal AF to MILD AFL, no BD  response, decreased RV, decreased DLCO. He also had PSG 12/14. He is seeing Dr Eulogio Ditch for TURP in January. He is unfortunately smoking again - 1 pk/day. He was started on Spiriva at time of discharge.    PULMONARY FUNCTON TEST 12/29/2010  FVC 3.59  FEV1 2.55  FEV1/FVC 71  FVC  % Predicted 91  FEV % Predicted 98  FeF 25-75 1.79  FeF 25-75 % Predicted 2.33   ROV 04/21/11 -- Follows up for dyspnea, tobacco use, OSA/OHS. We ordered BiPAP 15/9 since last visit, but he is unable to wear because the pressure is too high. He is smoking 2pk/day - pft showed mild AFL last time. He is on Spiriva, not sure it is helping him. Uses SABA rarely.    Acute OV 06/07/11  Complains of  increased SOB, prod cough with white mucus, wheezing x2 days - denies f/c/s, tightness.  will have hernia repair on 6.3.13 OTC not working  Wants to be better before surgery.  No fever or hemotpysis   ROV 06/26/11 -- COPD (mild AFL), continues to smoke 1 pk a day. Recent hernia surgery by Dr Harlow Asa. Yesterday fell and is dealing with a compression fx. Planning to be seen by ortho this week.  Breathing is stable, using SABA bid.  No flares since last time.   ROV 03/24/13 -- hx mild COPD. Since last visit he has been dx with DVT, then was dx with mesothelioma. Has been on chemo per Dr Katheran Awe. He will be due for repeat CT scanning and PET scanning in April. He has been started on O2. He is on 4L/min.  He continues to smoke 1 pk/day. He is using albuterol prn >  at least once a day. He is on spiriva qd.     Objective:   Physical Exam Filed Vitals:   03/24/13 1516  BP: 128/60  Pulse: 78  Height: 5\' 11"  (1.803 m)  Weight: 194 lb (87.998 kg)  SpO2: 94%    GEN: A/Ox3; pleasant , NAD, elderly   HEENT:  [place]/AT,  EACs-clear, TMs-wnl, NOSE-clear, THROAT-clear, no lesions, no postnasal drip or exudate noted.   NECK:  Supple w/ fair ROM; no JVD; normal carotid impulses w/o bruits; no thyromegaly or nodules palpated; no  lymphadenopathy.  RESP  Coarse BS w/o, wheezes/ rales/ or rhonchi.no accessory muscle use, no dullness to percussion  CARD:  RRR, no m/r/g  , tr peripheral edema, pulses intact, no cyanosis or clubbing.  Musco: Warm bil, no deformities or joint swelling noted.   Neuro: alert, no focal deficits noted.    Skin: Warm, no lesions or rashes       Assessment & Plan:   COPD (chronic obstructive pulmonary disease) Please continue your spiriva  We will start Symbicort 2 puffs twice a day  Continue to use albuterol nebulized as needed. We will order these for you and change your DME company from [facility] to [facility] Follow with Dr Lamonte Sakai in 1 month

## 2013-03-24 NOTE — Patient Instructions (Signed)
Please continue your spiriva  We will start Symbicort 2 puffs twice a day  Continue to use albuterol nebulized as needed. We will order these for you and change your DME company from [facility] to [facility] Follow with Dr Lamonte Sakai in 1 month

## 2013-03-25 ENCOUNTER — Telehealth: Payer: Self-pay | Admitting: Emergency Medicine

## 2013-03-25 NOTE — Telephone Encounter (Signed)
Melissa returned call to [employer]

## 2013-03-25 NOTE — Telephone Encounter (Signed)
Spoke with Melissa. She needs order with pt O2 settings and bipap settings. According to epic pt uses 4 l/m O2. No documentation regarding pt BIPAP settings or that he is on BIPAP since 2013.  Called [facility] and was advised they picked up pt BIPAP in July 2013. He hasn't been on it since. According to [facility] pt was placed on 4 liters O2. Does he need this 24/7?  Also is pt suppose to be on bipap? Please advise RB thanks

## 2013-03-26 NOTE — Telephone Encounter (Signed)
Please advise RB thanks 

## 2013-03-27 ENCOUNTER — Ambulatory Visit (HOSPITAL_COMMUNITY)
Admission: RE | Admit: 2013-03-27 | Discharge: 2013-03-27 | Disposition: A | Discharge status: Home / Self Care | Payer: Medicare Other | Source: Ambulatory Visit | Attending: Hematology & Oncology | Admitting: Hematology & Oncology

## 2013-03-27 ENCOUNTER — Other Ambulatory Visit (HOSPITAL_BASED_OUTPATIENT_CLINIC_OR_DEPARTMENT_OTHER): Payer: Medicare Other | Admitting: Lab

## 2013-03-27 ENCOUNTER — Ambulatory Visit (HOSPITAL_BASED_OUTPATIENT_CLINIC_OR_DEPARTMENT_OTHER): Payer: Medicare Other | Admitting: Hematology & Oncology

## 2013-03-27 ENCOUNTER — Encounter: Payer: Self-pay | Admitting: Hematology & Oncology

## 2013-03-27 ENCOUNTER — Ambulatory Visit (HOSPITAL_BASED_OUTPATIENT_CLINIC_OR_DEPARTMENT_OTHER): Payer: Medicare Other

## 2013-03-27 VITALS — BP 116/54 | HR 57 | Temp 97.4°F | Resp 18 | Wt 194.0 lb

## 2013-03-27 DIAGNOSIS — C450 Mesothelioma of pleura: Secondary | ICD-10-CM

## 2013-03-27 DIAGNOSIS — C457 Mesothelioma of other sites: Secondary | ICD-10-CM

## 2013-03-27 DIAGNOSIS — D472 Monoclonal gammopathy: Secondary | ICD-10-CM

## 2013-03-27 DIAGNOSIS — D6859 Other primary thrombophilia: Secondary | ICD-10-CM

## 2013-03-27 DIAGNOSIS — C384 Malignant neoplasm of pleura: Secondary | ICD-10-CM

## 2013-03-27 DIAGNOSIS — D649 Anemia, unspecified: Secondary | ICD-10-CM

## 2013-03-27 DIAGNOSIS — F17200 Nicotine dependence, unspecified, uncomplicated: Secondary | ICD-10-CM

## 2013-03-27 DIAGNOSIS — C3490 Malignant neoplasm of unspecified part of unspecified bronchus or lung: Secondary | ICD-10-CM | POA: Insufficient documentation

## 2013-03-27 DIAGNOSIS — Z5111 Encounter for antineoplastic chemotherapy: Secondary | ICD-10-CM

## 2013-03-27 DIAGNOSIS — C73 Malignant neoplasm of thyroid gland: Secondary | ICD-10-CM

## 2013-03-27 LAB — CBC WITH DIFFERENTIAL (CANCER CENTER ONLY)
BASO#: 0 10*3/uL (ref 0.0–0.2)
BASO%: 0.1 % (ref 0.0–2.0)
EOS%: 0.1 % (ref 0.0–7.0)
Eosinophils Absolute: 0 10*3/uL (ref 0.0–0.5)
HEMATOCRIT: 25.3 % — AB (ref 38.7–49.9)
HGB: 8.2 g/dL — ABNORMAL LOW (ref 13.0–17.1)
LYMPH#: 0.7 10*3/uL — AB (ref 0.9–3.3)
LYMPH%: 9.4 % — ABNORMAL LOW (ref 14.0–48.0)
MCH: 32.5 pg (ref 28.0–33.4)
MCHC: 32.4 g/dL (ref 32.0–35.9)
MCV: 100 fL — ABNORMAL HIGH (ref 82–98)
MONO#: 0.3 10*3/uL (ref 0.1–0.9)
MONO%: 3.7 % (ref 0.0–13.0)
NEUT#: 6.1 10*3/uL (ref 1.5–6.5)
NEUT%: 86.7 % — AB (ref 40.0–80.0)
Platelets: 141 10*3/uL — ABNORMAL LOW (ref 145–400)
RBC: 2.52 10*6/uL — ABNORMAL LOW (ref 4.20–5.70)
RDW: 15.9 % — ABNORMAL HIGH (ref 11.1–15.7)
WBC: 7 10*3/uL (ref 4.0–10.0)

## 2013-03-27 LAB — ABO/RH: ABO/RH(D): A POS

## 2013-03-27 LAB — CMP (CANCER CENTER ONLY)
ALBUMIN: 2.8 g/dL — AB (ref 3.3–5.5)
ALT(SGPT): 14 U/L (ref 10–47)
AST: 20 U/L (ref 11–38)
Alkaline Phosphatase: 96 U/L — ABNORMAL HIGH (ref 26–84)
BUN: 17 mg/dL (ref 7–22)
CALCIUM: 8.7 mg/dL (ref 8.0–10.3)
CHLORIDE: 105 meq/L (ref 98–108)
CO2: 28 mEq/L (ref 18–33)
Creat: 1.1 mg/dl (ref 0.6–1.2)
GLUCOSE: 162 mg/dL — AB (ref 73–118)
POTASSIUM: 3.8 meq/L (ref 3.3–4.7)
Sodium: 142 mEq/L (ref 128–145)
Total Bilirubin: 0.5 mg/dl (ref 0.20–1.60)
Total Protein: 7.9 g/dL (ref 6.4–8.1)

## 2013-03-27 LAB — PREPARE RBC (CROSSMATCH)

## 2013-03-27 MED ORDER — SODIUM CHLORIDE 0.9 % IV SOLN
360.0000 mg/m2 | Freq: Once | INTRAVENOUS | Status: AC
  Administered 2013-03-27: 750 mg via INTRAVENOUS
  Filled 2013-03-27: qty 30

## 2013-03-27 MED ORDER — SODIUM CHLORIDE 0.9 % IV SOLN
480.0000 mg | Freq: Once | INTRAVENOUS | Status: AC
  Administered 2013-03-27: 480 mg via INTRAVENOUS
  Filled 2013-03-27: qty 48

## 2013-03-27 MED ORDER — SODIUM CHLORIDE 0.9 % IV SOLN
Freq: Once | INTRAVENOUS | Status: AC
  Administered 2013-03-27: 11:00:00 via INTRAVENOUS

## 2013-03-27 MED ORDER — ONDANSETRON 16 MG/50ML IVPB (CHCC)
INTRAVENOUS | Status: AC
  Filled 2013-03-27: qty 16

## 2013-03-27 MED ORDER — HEPARIN SOD (PORK) LOCK FLUSH 100 UNIT/ML IV SOLN
500.0000 [IU] | Freq: Once | INTRAVENOUS | Status: AC | PRN
  Administered 2013-03-27: 500 [IU]
  Filled 2013-03-27: qty 5

## 2013-03-27 MED ORDER — SODIUM CHLORIDE 0.9 % IJ SOLN
10.0000 mL | INTRAMUSCULAR | Status: DC | PRN
  Administered 2013-03-27: 10 mL
  Filled 2013-03-27: qty 10

## 2013-03-27 MED ORDER — DEXAMETHASONE SODIUM PHOSPHATE 20 MG/5ML IJ SOLN
INTRAMUSCULAR | Status: AC
  Filled 2013-03-27: qty 5

## 2013-03-27 MED ORDER — DEXAMETHASONE SODIUM PHOSPHATE 20 MG/5ML IJ SOLN
20.0000 mg | Freq: Once | INTRAMUSCULAR | Status: AC
  Administered 2013-03-27: 20 mg via INTRAVENOUS

## 2013-03-27 MED ORDER — ONDANSETRON 16 MG/50ML IVPB (CHCC)
16.0000 mg | Freq: Once | INTRAVENOUS | Status: AC
  Administered 2013-03-27: 16 mg via INTRAVENOUS

## 2013-03-27 NOTE — Patient Instructions (Signed)
[facility] Discharge Instructions for Patients Receiving Chemotherapy  Today you received the following chemotherapy agents Alimta and Carboplatin.  To help prevent nausea and vomiting after your treatment, we encourage you to take your nausea medication as prescribed.   If you develop nausea and vomiting that is not controlled by your nausea medication, call the clinic.   BELOW ARE SYMPTOMS THAT SHOULD BE REPORTED IMMEDIATELY:  *FEVER GREATER THAN 100.5 F  *CHILLS WITH OR WITHOUT FEVER  NAUSEA AND VOMITING THAT IS NOT CONTROLLED WITH YOUR NAUSEA MEDICATION  *UNUSUAL SHORTNESS OF BREATH  *UNUSUAL BRUISING OR BLEEDING  TENDERNESS IN MOUTH AND THROAT WITH OR WITHOUT PRESENCE OF ULCERS  *URINARY PROBLEMS  *BOWEL PROBLEMS  UNUSUAL RASH Items with * indicate a potential emergency and should be followed up as soon as possible.  Feel free to call the clinic you have any questions or concerns. The clinic phone number is [phone].

## 2013-03-27 NOTE — Progress Notes (Signed)
Hematology and Oncology Follow Up Visit  Steve [name] [id number] 1933/10/24 80 y.o. 03/27/2013   Principle Diagnosis:  . 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 2. Heterozygote factor V Leiden mutation. 3. Lupus anticoagulant positive. 4. Deep venous thrombosis of the right leg. 5. 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 6. Papillary carcinoma of the thyroid.  Current Therapy:    Status post 3 cycles of carboplatin/Alimta  Xarelto 20 mg by mouth daily     Interim History:  Steve [name] is back for followup. He's doing okay. He is a little more weak. He settled nausea. This seems to happen about 3 or 4 days after chemotherapy.  He's had no bleeding. He's had a little bit of a cough. He's not coughing up blood. He's down to smoking one pack a day of cigarettes.  His had no leg swelling. He's had no fever. He's had no headache.  Overall, I would say that it performance status is ECoG1.  Medications: Current outpatient prescriptions:albuterol (PROVENTIL) (2.5 MG/3ML) 0.083% nebulizer solution, Take 3 mLs (2.5 mg total) by nebulization every 6 (six) hours as needed for wheezing., Disp: 360 mL, Rfl: 6;  albuterol (PROVENTIL) (2.5 MG/3ML) 0.083% nebulizer solution, Take 3 mLs (2.5 mg total) by nebulization every 6 (six) hours as needed for wheezing., Disp: 360 mL, Rfl: 6;  ALBUTEROL IN, Inhale into the lungs as needed., Disp: , Rfl:  budesonide-formoterol (SYMBICORT) 160-4.5 MCG/ACT inhaler, Inhale 2 puffs into the lungs 2 (two) times daily., Disp: 1 Inhaler, Rfl: 6;  calcium carbonate (OS-CAL - DOSED IN MG OF ELEMENTAL CALCIUM) 1250 MG tablet, Take 2 tablets by mouth 2 (two) times daily., Disp: , Rfl: ;  cefdinir (OMNICEF) 300 MG capsule, Take 1 capsule (300 mg total) by mouth 2 (two) times daily., Disp: 10 capsule, Rfl: 0 Cholecalciferol (VITAMIN D3) 1000 UNITS CAPS, Take 1 capsule by mouth daily., Disp: , Rfl: ;  dexamethasone (DECADRON) 4 MG tablet, Take by mouth  daily. Take 1 day pre chemo Tx and 5 days after Tx., Disp: , Rfl: ;  folic acid (FOLVITE) 1 MG tablet, Take 1 tablet (1 mg total) by mouth daily. Take daily starting 5-7 days before Alimta chemotherapy. Continue until 21 days after Alimta completed., Disp: 100 tablet, Rfl: 3 HYDROcodone-acetaminophen (NORCO/VICODIN) 5-325 MG per tablet, Take 1 tablet by mouth every 6 (six) hours as needed for moderate pain., Disp: 30 tablet, Rfl: 0;  levothyroxine (SYNTHROID, LEVOTHROID) 150 MCG tablet, Take 150 mcg by mouth daily before breakfast., Disp: , Rfl: ;  lidocaine-prilocaine (EMLA) cream, Apply 1 application topically as needed., Disp: 30 g, Rfl: 3 lisinopril (PRINIVIL,ZESTRIL) 10 MG tablet, TAKE ONE TABLET BY MOUTH TWICE DAILY, Disp: 60 tablet, Rfl: 3;  LORazepam (ATIVAN) 0.5 MG tablet, Take 1 tablet (0.5 mg total) by mouth every 6 (six) hours as needed (Nausea or vomiting)., Disp: 30 tablet, Rfl: 0;  losartan (COZAAR) 25 MG tablet, Take 25 mg by mouth daily., Disp: , Rfl: ;  metoprolol tartrate (LOPRESSOR) 25 MG tablet, Take 12.5 mg by mouth 2 (two) times daily., Disp: , Rfl:  ondansetron (ZOFRAN) 8 MG tablet, Take 1 tablet (8 mg total) by mouth 2 (two) times daily. Take two times a day starting the day after chemo for 3 days. Then take two times a day as needed for nausea or vomiting., Disp: 30 tablet, Rfl: 1;  prochlorperazine (COMPAZINE) 10 MG tablet, Take 1 tablet (10 mg total) by mouth every 6 (six) hours as needed (Nausea or  vomiting)., Disp: 30 tablet, Rfl: 1 Rivaroxaban (XARELTO) 20 MG TABS tablet, Take 20 mg by mouth daily with supper., Disp: , Rfl: ;  senna (SENOKOT) 8.6 MG TABS, Take 2 tablets by mouth at bedtime., Disp: , Rfl: ;  sertraline (ZOLOFT) 100 MG tablet, TAKE ONE TABLET BY MOUTH ONCE DAILY BEFORE BREAKFAST, Disp: 30 tablet, Rfl: 2;  SPIRIVA HANDIHALER 18 MCG inhalation capsule, Place 18 mcg into inhaler and inhale every morning. , Disp: , Rfl:  traMADol (ULTRAM) 50 MG tablet, Take 50 mg by  mouth every 6 (six) hours as needed for moderate pain or severe pain., Disp: , Rfl:  No current facility-administered medications for this visit. Facility-Administered Medications Ordered in Other Visits: sodium chloride 0.9 % injection 10 mL, 10 mL, Intracatheter, PRN, Volanda Napoleon, MD, 10 mL at 03/27/13 1218  Allergies:  Allergies  Allergen Reactions  . Lyrica [Pregabalin] Hives and Itching    Past Medical History, Surgical history, Social history, and Family History were reviewed and updated.  Review of Systems: As above  Physical Exam:  weight is 194 lb (87.998 kg). His temperature is 97.4 F (36.3 C). His blood pressure is 116/54 and his pulse is 57. His respiration is 18 and oxygen saturation is 96%.   Fairly well but well-nourished white gentleman. He has good breath sounds bilaterally. He has some crackles bilaterally. Cardiac exam regular rate rhythm with no murmurs rubs or bruits. Neck is supple without adenopathy. Abdomen is soft. Has good bowel sounds. There is no fluid wave. There is no palpable hepato- splenomegaly back exam no tenderness over the spine ribs or hips. Extremities shows no clubbing cyanosis or edema. No venous cord is noted in his legs. Skin exam no rashes or ecchymoses. Neurological exam no focal neurological deficits.  Lab Results  Component Value Date   WBC 7.0 03/27/2013   HGB 8.2* 03/27/2013   HCT 25.3* 03/27/2013   MCV 100* 03/27/2013   PLT 141* 03/27/2013     Chemistry      Component Value Date/Time   NA 142 03/27/2013 0850   NA 140 02/10/2013 1808   K 3.8 03/27/2013 0850   K 4.9 02/10/2013 1808   CL 105 03/27/2013 0850   CL 103 02/10/2013 1808   CO2 28 03/27/2013 0850   CO2 24 02/10/2013 1808   BUN 17 03/27/2013 0850   BUN 20 02/10/2013 1808   CREATININE 1.1 03/27/2013 0850   CREATININE 1.10 02/10/2013 1808      Component Value Date/Time   CALCIUM 8.7 03/27/2013 0850   CALCIUM 8.5 02/10/2013 1808   ALKPHOS 96* 03/27/2013 0850   ALKPHOS 79 12/10/2012 1027    AST 20 03/27/2013 0850   AST 12 12/10/2012 1027   ALT 14 03/27/2013 0850   ALT 10 12/10/2012 1027   BILITOT 0.50 03/27/2013 0850   BILITOT 0.5 12/10/2012 1027         Impression and Plan: Steve [name] is a 80 year old gentleman. He has mesothelioma of the right lung. He really is not a candidate for any surgery.  We will proceed with his fourth cycle of chemotherapy today. I likely will get a PET scan on him so we can see what kind of response we had.  He is anemic. I think that we need to transfuse him with 2 units of blood. I talked him he and his family about this. I told him that I thought that this was important as this would help with his cardio vascular status.  We will plan to get him back in 3 more weeks. If we see that he is responding, that we will continue him on chemotherapy.  I spent a good 25 minutes with he and his family talking to him about the blood transfusion, while we needed it, the side effects and risks. He understands all these and will proceed.   Volanda Napoleon, MD 3/19/20156:15 PM

## 2013-03-27 NOTE — Telephone Encounter (Signed)
For now, just change the nebs and the O2 to [facility]. The O2 is 4L/min. He has not been using the BiPAP to my knowledge, had trouble tolerating it. We can sort out whether he truly needs this later.

## 2013-03-27 NOTE — Telephone Encounter (Signed)
Dr. Byrum - please advise. Thanks. 

## 2013-03-28 ENCOUNTER — Ambulatory Visit (HOSPITAL_BASED_OUTPATIENT_CLINIC_OR_DEPARTMENT_OTHER): Payer: Medicare Other

## 2013-03-28 ENCOUNTER — Telehealth: Payer: Self-pay | Admitting: Emergency Medicine

## 2013-03-28 ENCOUNTER — Encounter: Payer: Self-pay | Admitting: Hematology & Oncology

## 2013-03-28 VITALS — BP 124/53 | HR 61 | Temp 97.5°F | Resp 18

## 2013-03-28 DIAGNOSIS — J449 Chronic obstructive pulmonary disease, unspecified: Secondary | ICD-10-CM

## 2013-03-28 DIAGNOSIS — C73 Malignant neoplasm of thyroid gland: Secondary | ICD-10-CM

## 2013-03-28 DIAGNOSIS — D649 Anemia, unspecified: Secondary | ICD-10-CM

## 2013-03-28 DIAGNOSIS — C457 Mesothelioma of other sites: Secondary | ICD-10-CM

## 2013-03-28 DIAGNOSIS — C450 Mesothelioma of pleura: Secondary | ICD-10-CM

## 2013-03-28 MED ORDER — HEPARIN SOD (PORK) LOCK FLUSH 100 UNIT/ML IV SOLN
500.0000 [IU] | Freq: Every day | INTRAVENOUS | Status: AC | PRN
  Administered 2013-03-28: 500 [IU]
  Filled 2013-03-28: qty 5

## 2013-03-28 MED ORDER — FUROSEMIDE 10 MG/ML IJ SOLN
10.0000 mg | Freq: Once | INTRAMUSCULAR | Status: AC
  Administered 2013-03-28: 10 mg via INTRAVENOUS

## 2013-03-28 MED ORDER — SODIUM CHLORIDE 0.9 % IJ SOLN
10.0000 mL | INTRAMUSCULAR | Status: AC | PRN
  Administered 2013-03-28: 10 mL
  Filled 2013-03-28: qty 10

## 2013-03-28 MED ORDER — ACETAMINOPHEN 325 MG PO TABS
650.0000 mg | ORAL_TABLET | Freq: Once | ORAL | Status: AC
  Administered 2013-03-28: 650 mg via ORAL

## 2013-03-28 MED ORDER — DIPHENHYDRAMINE HCL 25 MG PO CAPS
25.0000 mg | ORAL_CAPSULE | Freq: Once | ORAL | Status: AC
  Administered 2013-03-28: 25 mg via ORAL

## 2013-03-28 MED ORDER — SODIUM CHLORIDE 0.9 % IV SOLN
250.0000 mL | Freq: Once | INTRAVENOUS | Status: AC
  Administered 2013-03-28: 250 mL via INTRAVENOUS

## 2013-03-28 MED ORDER — DIPHENHYDRAMINE HCL 25 MG PO CAPS
ORAL_CAPSULE | ORAL | Status: AC
  Filled 2013-03-28: qty 1

## 2013-03-28 MED ORDER — ACETAMINOPHEN 325 MG PO TABS
ORAL_TABLET | ORAL | Status: AC
  Filled 2013-03-28: qty 2

## 2013-03-28 MED ORDER — FUROSEMIDE 10 MG/ML IJ SOLN
INTRAMUSCULAR | Status: AC
  Filled 2013-03-28: qty 4

## 2013-03-28 NOTE — Patient Instructions (Signed)
Blood Transfusion  A blood transfusion replaces your blood or some of its parts. Blood is replaced when you have lost blood because of surgery, an accident, or for severe blood conditions like anemia. You can donate blood to be used on yourself if you have a planned surgery. If you lose blood during that surgery, your own blood can be given back to you. Any blood given to you is checked to make sure it matches your blood type. Your temperature, blood pressure, and heart rate (vital signs) will be checked often.  GET HELP RIGHT AWAY IF:   You feel sick to your stomach (nauseous) or throw up (vomit).  You have watery poop (diarrhea).  You have shortness of breath or trouble breathing.  You have blood in your pee (urine) or have dark colored pee.  You have chest pain or tightness.  Your eyes or skin turn yellow (jaundice).  You have a temperature by mouth above 102 F (38.9 C), not controlled by medicine.  You start to shake and have chills.  You develop a a red rash (hives) or feel itchy.  You develop lightheadedness or feel confused.  You develop back, joint, or muscle pain.  You do not feel hungry (lost appetite).  You feel tired, restless, or nervous.  You develop belly (abdominal) cramps. Document Released: 03/24/2008 Document Revised: 03/20/2011 Document Reviewed: 03/24/2008 Exit[employer] Patient Information 2014 [employer].  

## 2013-03-28 NOTE — Telephone Encounter (Signed)
Called and made [facility] aware. She needs anything further she will let us know.

## 2013-03-28 NOTE — Telephone Encounter (Signed)
Order has been placed. Nothing further needed. 

## 2013-03-29 LAB — TYPE AND SCREEN
ABO/RH(D): A POS
Antibody Screen: NEGATIVE
Unit division: 0
Unit division: 0

## 2013-04-02 ENCOUNTER — Inpatient Hospital Stay (HOSPITAL_COMMUNITY)
Admission: EM | Admit: 2013-04-02 | Discharge: 2013-04-07 | DRG: 356 | Disposition: A | Discharge status: Home / Self Care | Payer: Medicare Other | Attending: Internal Medicine | Admitting: Internal Medicine

## 2013-04-02 ENCOUNTER — Emergency Department (HOSPITAL_COMMUNITY): Payer: Medicare Other

## 2013-04-02 ENCOUNTER — Encounter (HOSPITAL_COMMUNITY): Payer: Self-pay | Admitting: Emergency Medicine

## 2013-04-02 DIAGNOSIS — F17200 Nicotine dependence, unspecified, uncomplicated: Secondary | ICD-10-CM | POA: Diagnosis present

## 2013-04-02 DIAGNOSIS — D649 Anemia, unspecified: Secondary | ICD-10-CM

## 2013-04-02 DIAGNOSIS — Z86718 Personal history of other venous thrombosis and embolism: Secondary | ICD-10-CM

## 2013-04-02 DIAGNOSIS — M5416 Radiculopathy, lumbar region: Secondary | ICD-10-CM

## 2013-04-02 DIAGNOSIS — J441 Chronic obstructive pulmonary disease with (acute) exacerbation: Secondary | ICD-10-CM | POA: Diagnosis present

## 2013-04-02 DIAGNOSIS — K5900 Constipation, unspecified: Secondary | ICD-10-CM | POA: Diagnosis present

## 2013-04-02 DIAGNOSIS — D6859 Other primary thrombophilia: Secondary | ICD-10-CM | POA: Diagnosis present

## 2013-04-02 DIAGNOSIS — F329 Major depressive disorder, single episode, unspecified: Secondary | ICD-10-CM | POA: Diagnosis present

## 2013-04-02 DIAGNOSIS — D6481 Anemia due to antineoplastic chemotherapy: Secondary | ICD-10-CM | POA: Diagnosis present

## 2013-04-02 DIAGNOSIS — E860 Dehydration: Secondary | ICD-10-CM | POA: Diagnosis present

## 2013-04-02 DIAGNOSIS — Z85850 Personal history of malignant neoplasm of thyroid: Secondary | ICD-10-CM

## 2013-04-02 DIAGNOSIS — G4733 Obstructive sleep apnea (adult) (pediatric): Secondary | ICD-10-CM | POA: Diagnosis present

## 2013-04-02 DIAGNOSIS — N289 Disorder of kidney and ureter, unspecified: Secondary | ICD-10-CM | POA: Diagnosis present

## 2013-04-02 DIAGNOSIS — Z9221 Personal history of antineoplastic chemotherapy: Secondary | ICD-10-CM

## 2013-04-02 DIAGNOSIS — R791 Abnormal coagulation profile: Secondary | ICD-10-CM | POA: Diagnosis present

## 2013-04-02 DIAGNOSIS — G4730 Sleep apnea, unspecified: Secondary | ICD-10-CM

## 2013-04-02 DIAGNOSIS — I82509 Chronic embolism and thrombosis of unspecified deep veins of unspecified lower extremity: Secondary | ICD-10-CM | POA: Diagnosis present

## 2013-04-02 DIAGNOSIS — D62 Acute posthemorrhagic anemia: Secondary | ICD-10-CM | POA: Diagnosis present

## 2013-04-02 DIAGNOSIS — K921 Melena: Principal | ICD-10-CM | POA: Diagnosis present

## 2013-04-02 DIAGNOSIS — I4891 Unspecified atrial fibrillation: Secondary | ICD-10-CM | POA: Diagnosis present

## 2013-04-02 DIAGNOSIS — C73 Malignant neoplasm of thyroid gland: Secondary | ICD-10-CM

## 2013-04-02 DIAGNOSIS — C450 Mesothelioma of pleura: Secondary | ICD-10-CM

## 2013-04-02 DIAGNOSIS — C3490 Malignant neoplasm of unspecified part of unspecified bronchus or lung: Secondary | ICD-10-CM | POA: Diagnosis present

## 2013-04-02 DIAGNOSIS — I10 Essential (primary) hypertension: Secondary | ICD-10-CM | POA: Diagnosis present

## 2013-04-02 DIAGNOSIS — C457 Mesothelioma of other sites: Secondary | ICD-10-CM

## 2013-04-02 DIAGNOSIS — Z7901 Long term (current) use of anticoagulants: Secondary | ICD-10-CM

## 2013-04-02 DIAGNOSIS — F3289 Other specified depressive episodes: Secondary | ICD-10-CM | POA: Diagnosis present

## 2013-04-02 DIAGNOSIS — R55 Syncope and collapse: Secondary | ICD-10-CM

## 2013-04-02 DIAGNOSIS — Z79899 Other long term (current) drug therapy: Secondary | ICD-10-CM

## 2013-04-02 DIAGNOSIS — D6959 Other secondary thrombocytopenia: Secondary | ICD-10-CM | POA: Diagnosis present

## 2013-04-02 DIAGNOSIS — G9340 Encephalopathy, unspecified: Secondary | ICD-10-CM | POA: Diagnosis present

## 2013-04-02 DIAGNOSIS — D696 Thrombocytopenia, unspecified: Secondary | ICD-10-CM

## 2013-04-02 DIAGNOSIS — I959 Hypotension, unspecified: Secondary | ICD-10-CM | POA: Diagnosis present

## 2013-04-02 DIAGNOSIS — K219 Gastro-esophageal reflux disease without esophagitis: Secondary | ICD-10-CM | POA: Diagnosis present

## 2013-04-02 DIAGNOSIS — Z9981 Dependence on supplemental oxygen: Secondary | ICD-10-CM

## 2013-04-02 DIAGNOSIS — E876 Hypokalemia: Secondary | ICD-10-CM | POA: Diagnosis not present

## 2013-04-02 DIAGNOSIS — D472 Monoclonal gammopathy: Secondary | ICD-10-CM | POA: Diagnosis present

## 2013-04-02 DIAGNOSIS — F411 Generalized anxiety disorder: Secondary | ICD-10-CM | POA: Diagnosis present

## 2013-04-02 DIAGNOSIS — J9620 Acute and chronic respiratory failure, unspecified whether with hypoxia or hypercapnia: Secondary | ICD-10-CM

## 2013-04-02 DIAGNOSIS — T451X5A Adverse effect of antineoplastic and immunosuppressive drugs, initial encounter: Secondary | ICD-10-CM | POA: Diagnosis present

## 2013-04-02 LAB — COMPREHENSIVE METABOLIC PANEL
ALK PHOS: 98 U/L (ref 39–117)
ALT: 26 U/L (ref 0–53)
AST: 29 U/L (ref 0–37)
Albumin: 2.7 g/dL — ABNORMAL LOW (ref 3.5–5.2)
BUN: 28 mg/dL — AB (ref 6–23)
CALCIUM: 8.6 mg/dL (ref 8.4–10.5)
CO2: 22 mEq/L (ref 19–32)
Chloride: 99 mEq/L (ref 96–112)
Creatinine, Ser: 1.16 mg/dL (ref 0.50–1.35)
GFR, EST AFRICAN AMERICAN: 68 mL/min — AB (ref >90)
GFR, EST NON AFRICAN AMERICAN: 59 mL/min — AB (ref >90)
GLUCOSE: 168 mg/dL — AB (ref 70–99)
POTASSIUM: 4 meq/L (ref 3.7–5.3)
Sodium: 139 mEq/L (ref 137–147)
TOTAL PROTEIN: 6.8 g/dL (ref 6.0–8.3)
Total Bilirubin: 0.6 mg/dL (ref 0.3–1.2)

## 2013-04-02 LAB — URINE MICROSCOPIC-ADD ON

## 2013-04-02 LAB — URINALYSIS, ROUTINE W REFLEX MICROSCOPIC
BILIRUBIN URINE: NEGATIVE
Glucose, UA: 100 mg/dL — AB
Ketones, ur: NEGATIVE mg/dL
Leukocytes, UA: NEGATIVE
Nitrite: NEGATIVE
PROTEIN: 100 mg/dL — AB
Specific Gravity, Urine: 1.011 (ref 1.005–1.030)
UROBILINOGEN UA: 1 mg/dL (ref 0.0–1.0)
pH: 7.5 (ref 5.0–8.0)

## 2013-04-02 LAB — I-STAT ARTERIAL BLOOD GAS, ED
Acid-base deficit: 4 mmol/L — ABNORMAL HIGH (ref 0.0–2.0)
Bicarbonate: 22.2 mEq/L (ref 20.0–24.0)
O2 Saturation: 96 %
PCO2 ART: 44.7 mmHg (ref 35.0–45.0)
Patient temperature: 97
TCO2: 24 mmol/L (ref 0–100)
pH, Arterial: 7.299 — ABNORMAL LOW (ref 7.350–7.450)
pO2, Arterial: 84 mmHg (ref 80.0–100.0)

## 2013-04-02 LAB — CBC WITH DIFFERENTIAL/PLATELET
BASOS ABS: 0 10*3/uL (ref 0.0–0.1)
BASOS PCT: 0 % (ref 0–1)
EOS ABS: 0 10*3/uL (ref 0.0–0.7)
Eosinophils Relative: 0 % (ref 0–5)
HCT: 32.5 % — ABNORMAL LOW (ref 39.0–52.0)
Hemoglobin: 11.1 g/dL — ABNORMAL LOW (ref 13.0–17.0)
Lymphocytes Relative: 21 % (ref 12–46)
Lymphs Abs: 1.1 10*3/uL (ref 0.7–4.0)
MCH: 32.6 pg (ref 26.0–34.0)
MCHC: 34.2 g/dL (ref 30.0–36.0)
MCV: 95.3 fL (ref 78.0–100.0)
Monocytes Absolute: 0 10*3/uL — ABNORMAL LOW (ref 0.1–1.0)
Monocytes Relative: 1 % — ABNORMAL LOW (ref 3–12)
NEUTROS ABS: 3.9 10*3/uL (ref 1.7–7.7)
NEUTROS PCT: 78 % — AB (ref 43–77)
PLATELETS: 137 10*3/uL — AB (ref 150–400)
RBC: 3.41 MIL/uL — ABNORMAL LOW (ref 4.22–5.81)
RDW: 16.4 % — AB (ref 11.5–15.5)
WBC: 5 10*3/uL (ref 4.0–10.5)

## 2013-04-02 LAB — PROTIME-INR
INR: 1.27 (ref 0.00–1.49)
PROTHROMBIN TIME: 15.6 s — AB (ref 11.6–15.2)

## 2013-04-02 LAB — PRO B NATRIURETIC PEPTIDE: PRO B NATRI PEPTIDE: 1477 pg/mL — AB (ref 0–450)

## 2013-04-02 LAB — LIPASE, BLOOD: Lipase: 27 U/L (ref 11–59)

## 2013-04-02 LAB — LACTIC ACID, PLASMA: Lactic Acid, Venous: 3.2 mmol/L — ABNORMAL HIGH (ref 0.5–2.2)

## 2013-04-02 LAB — I-STAT CG4 LACTIC ACID, ED: Lactic Acid, Venous: 3.39 mmol/L — ABNORMAL HIGH (ref 0.5–2.2)

## 2013-04-02 MED ORDER — SODIUM CHLORIDE 0.9 % IV BOLUS (SEPSIS)
1000.0000 mL | Freq: Once | INTRAVENOUS | Status: AC
  Administered 2013-04-02: 1000 mL via INTRAVENOUS

## 2013-04-02 MED ORDER — PIPERACILLIN-TAZOBACTAM 3.375 G IVPB 30 MIN
3.3750 g | Freq: Once | INTRAVENOUS | Status: AC
  Administered 2013-04-02: 3.375 g via INTRAVENOUS
  Filled 2013-04-02: qty 50

## 2013-04-02 MED ORDER — SODIUM CHLORIDE 0.9 % IV SOLN
INTRAVENOUS | Status: DC
  Administered 2013-04-02: 20:00:00 via INTRAVENOUS

## 2013-04-02 MED ORDER — VANCOMYCIN HCL 10 G IV SOLR
1750.0000 mg | Freq: Once | INTRAVENOUS | Status: AC
  Administered 2013-04-02: 1750 mg via INTRAVENOUS
  Filled 2013-04-02: qty 1750

## 2013-04-02 MED ORDER — PIPERACILLIN-TAZOBACTAM 3.375 G IVPB: 3.3750 g | Freq: Three times a day (TID) | INTRAVENOUS | Status: DC

## 2013-04-02 MED ORDER — IOHEXOL 300 MG/ML  SOLN
100.0000 mL | Freq: Once | INTRAMUSCULAR | Status: AC | PRN
  Administered 2013-04-02: 100 mL via INTRAVENOUS

## 2013-04-02 MED ORDER — VANCOMYCIN HCL IN DEXTROSE 750-5 MG/150ML-% IV SOLN
750.0000 mg | Freq: Two times a day (BID) | INTRAVENOUS | Status: DC
  Filled 2013-04-02: qty 150

## 2013-04-02 MED ORDER — VANCOMYCIN HCL IN DEXTROSE 1-5 GM/200ML-% IV SOLN: 1000.0000 mg | Freq: Once | INTRAVENOUS | Status: DC

## 2013-04-02 MED ORDER — ONDANSETRON HCL 4 MG/2ML IJ SOLN
4.0000 mg | Freq: Once | INTRAMUSCULAR | Status: AC
  Administered 2013-04-02: 4 mg via INTRAVENOUS
  Filled 2013-04-02: qty 2

## 2013-04-02 NOTE — ED Notes (Signed)
RN with pt to CT. VSS throughout testing. Pt returned to room.

## 2013-04-02 NOTE — Progress Notes (Signed)
ABG unable to be drawn in timely manner due to patient being in CT. RN to call when patient is available

## 2013-04-02 NOTE — ED Notes (Signed)
Per EMS pt here c/o constipation and weakness. Pt was attempting to have BM today and began to feel extremely weak, pt was helped to the floor by family. Upon arrival pt BP 60/40 manual to right arm and 72/40 left arm. MD brought to bedside. NS running to 18g LAC

## 2013-04-02 NOTE — ED Notes (Signed)
MD at bedside. 

## 2013-04-02 NOTE — Consult Note (Signed)
PULMONARY / CRITICAL CARE MEDICINE   Name: Steve [name] MRN: [id number] DOB: [birth year]    ADMISSION DATE:  04/02/2013 CONSULTATION DATE:  04/02/13   REFERRING MD :  Dr. Rogene Houston PRIMARY SERVICE: TRH  CHIEF COMPLAINT:  Concern for Sepsis   BRIEF PATIENT DESCRIPTION: 80 y/o M with known hx of COPD, Mesothelioma on Chemo who presented to [facility] ER on 3/25 with hypotension & AMS after passing out while trying to use the restroom .     SIGNIFICANT EVENTS / STUDIES:  3/25 - Admit after syncopal episode while trying to use restroom  3/25 - CT HEAD >> no acute intracranial abnormality, mild chronic small vessel disease 3/25 - CT ABD/PELVIS >> fluid filled colon with very mild inflammatory stranding in distal descending colon, unchanged soft tissue lesion adjacent to upper pole of the R kidney, irregular pleural thickening in R base c/w known mesothelioma    CULTURES: BCx2 3/25>>>  ANTIBIOTICS: Vanco (empiric) 3/25>>> Zosyn (empiric) 3/25>>>  HISTORY OF PRESENT ILLNESS:  80 y/o M with PMH of GERD, AAA s/p Repair, HTN, Lupus Anti-Coagulant Positive / DVT on xarelto, arthritis, OSA with severe sleep apnea, thyroidectomy for CA, COPD on 4L O2 at baseline, lung cancer with ongoing chemotherapy (most recent 3/18 at [facility]) and chemo related anemia s/p transfusion of 2 units PRBC's on 3/19 who presented to [facility] ER on 3/25 after a syncopal episode at home.  Pt reports he has had approx 1 week of constipation and had taken MOM on day of admit in attempts to have BM.  Had gone twice to the restroom and on second attempt was too weak and passed out.  Family called EMS.  Daughter checked saturations after episode and despite 4L O2 had saturations of 50% on home reading.  She increased his O2 to 6L.   On EMS arrival, his saturations were in the 70's.  He was also noted to have systolic BP in the 09'N.  Pt's normal blood pressure runs 235 systolic per wife.  In route to ER, EMS noted pressures of 573  systolic.    ER work up included a negative UA, CXR without overt infiltrate - mild edema, lactic acid of 3.39, sr cr 1.16 - up from 0.5 one month prior, proBNP 1477, negative CT head and negative CT ABD/Pelvis.  PCCM consulted for evaluation.    Pt denies chest pain, nausea / vomiting, fevers, chills, headache.  He does indicate lower abdominal discomfort and now 2 BM's in the ER after MOM. Denies weight loss, sweats, hemoptysis.    PAST MEDICAL HISTORY :  Past Medical History  Diagnosis Date  . Prostate enlargement   . GERD (gastroesophageal reflux disease)   . AAA (abdominal aortic aneurysm)     stress test 09/14/10 EPIC  . Anxiety   . Recurrent upper respiratory infection (URI)     productive cough- white phlegm- no fever  . Hypertension     chest x ray 12/12 EPIC- repeated 06/06/11, EKG 11/12 EPIC  . DVT (deep venous thrombosis) 08/2011  . Pneumonia   . Bruises easily     takes Xarelto  . Depression   . Dysrhythmia     hx of atrial fib post op x 2 days   . Atrial fibrillation   . Mesothelioma   . Arthritis     knee and back  . Sleep apnea      12/12 sleep study,SEVERE per study  Dr Lamonte Sakai- states doesnt wear machine on regular basis- setting  BiPAP 9/9  . H/O hiatal hernia   . COPD (chronic obstructive pulmonary disease)     patient uses oxygen 2L/[place] at nite   . Epistaxis 05/13/2012  . Olecranon bursitis of right elbow 06/28/2012   Past Surgical History  Procedure Laterality Date  . Knee surgery  1957    left knee arthotomy  . Abdominal aortic aneurysm repair  11/14/2010    AAA Repair    Dr Donnetta Hutching  . Transurethral resection of prostate  01/25/2011    TURP  . Incisional hernia repair  06/12/2011    Procedure: HERNIA REPAIR INCISIONAL;  Surgeon: Earnstine Regal, MD;  Location: [facility];  Service: General;  Laterality: N/A;  Open Repair Ventral Incisional Hernia with Mesh  . Hernia repair    . Video assisted thoracoscopy  11/02/2011    Procedure: VIDEO ASSISTED THORACOSCOPY;   Surgeon: Melrose Nakayama, MD;  Location: [place];  Service: Thoracic;  Laterality: Right;  . Pleural biopsy  11/02/2011    Procedure: PLEURAL BIOPSY;  Surgeon: Melrose Nakayama, MD;  Location: [place];  Service: Thoracic;  Laterality: Right;  Parietal and visceral biopsies  . Chest tube insertion  11/02/2011    Procedure: INSERTION PLEURAL DRAINAGE CATHETER;  Surgeon: Melrose Nakayama, MD;  Location: [place];  Service: Thoracic;  Laterality: Right;  . Decortication  11/02/2011    Procedure: DECORTICATION;  Surgeon: Melrose Nakayama, MD;  Location: [place];  Service: Thoracic;  Laterality: Right;  . Pleural effusion drainage  11/02/2011    Procedure: DRAINAGE OF PLEURAL EFFUSION;  Surgeon: Melrose Nakayama, MD;  Location: [place];  Service: Thoracic;  Laterality: Right;  . Cholecystectomy  01/19/2012    Procedure: LAPAROSCOPIC CHOLECYSTECTOMY WITH INTRAOPERATIVE CHOLANGIOGRAM;  Surgeon: Zenovia Jarred, MD;  Location: [place];  Service: General;  Laterality: N/A;  laparoscopic cholecystectomy with intraoperative cholangiogram  . Ercp  01/22/2012    Procedure: ENDOSCOPIC RETROGRADE CHOLANGIOPANCREATOGRAPHY (ERCP);  Surgeon: Inda Castle, MD;  Location: [place];  Service: Endoscopy;  Laterality: N/A;  with stent placement  . Ercp  01/21/2012    Procedure: ENDOSCOPIC RETROGRADE CHOLANGIOPANCREATOGRAPHY (ERCP);  Surgeon: Milus Banister, MD;  Location: [place];  Service: Gastroenterology;  Laterality: N/A;  . Esophagogastroduodenoscopy  01/24/2012    Procedure: ESOPHAGOGASTRODUODENOSCOPY (EGD);  Surgeon: Inda Castle, MD;  Location: [place];  Service: Endoscopy;  Laterality: N/A;  remove pancr stent   . Endoscopic retrograde cholangiopancreatography (ercp) with propofol N/A 03/07/2012    Procedure: ENDOSCOPIC RETROGRADE CHOLANGIOPANCREATOGRAPHY (ERCP) WITH PROPOFOL;  Surgeon: Milus Banister, MD;  Location: [facility];  Service: Endoscopy;  Laterality: N/A;  remove stent  . Thyroidectomy  N/A 04/04/2012    Procedure:  TOTAL THYROIDECTOMY WITH CENTRAL COMPARTMENT LYMPH NODE DISSECTION;  Surgeon: Earnstine Regal, MD;  Location: [facility];  Service: General;  Laterality: N/A;   Prior to Admission medications   Medication Sig Start Date End Date Taking? Authorizing Provider  albuterol (PROVENTIL HFA;VENTOLIN HFA) 108 (90 BASE) MCG/ACT inhaler Inhale 1 puff into the lungs every 6 (six) hours as needed for shortness of breath.   Yes Historical Provider, MD  albuterol (PROVENTIL) (2.5 MG/3ML) 0.083% nebulizer solution Take 3 mLs (2.5 mg total) by nebulization every 6 (six) hours as needed for wheezing. 03/24/13  Yes Collene Gobble, MD  albuterol (PROVENTIL) (2.5 MG/3ML) 0.083% nebulizer solution Take 3 mLs (2.5 mg total) by nebulization every 6 (six) hours as needed for wheezing. 03/24/13  Yes Collene Gobble, MD  budesonide-formoterol (SYMBICORT) 160-4.5 MCG/ACT inhaler Inhale 2 puffs into the lungs 2 (two) times daily. 03/24/13  Yes Collene Gobble, MD  calcium carbonate (OS-CAL - DOSED IN MG OF ELEMENTAL CALCIUM) 1250 MG tablet Take 2 tablets by mouth 2 (two) times daily. 04/05/12  Yes Earnstine Regal, MD  Cholecalciferol (VITAMIN D3) 1000 UNITS CAPS Take 1 capsule by mouth daily.   Yes Historical Provider, MD  dexamethasone (DECADRON) 4 MG tablet Take 4 mg by mouth See admin instructions. Take 1 day pre chemo Tx and 5 days after Tx. 01/17/13  Yes Historical Provider, MD  folic acid (FOLVITE) 1 MG tablet Take 1 tablet (1 mg total) by mouth daily. Take daily starting 5-7 days before Alimta chemotherapy. Continue until 21 days after Alimta completed. 01/23/13  Yes Volanda Napoleon, MD  levothyroxine (SYNTHROID, LEVOTHROID) 150 MCG tablet Take 150 mcg by mouth daily before breakfast.   Yes Historical Provider, MD  lidocaine-prilocaine (EMLA) cream Apply 1 application topically as needed. 01/21/13  Yes Volanda Napoleon, MD  lisinopril (PRINIVIL,ZESTRIL) 10 MG tablet TAKE ONE TABLET BY MOUTH TWICE DAILY   Yes  Mosie Lukes, MD  LORazepam (ATIVAN) 0.5 MG tablet Take 1 tablet (0.5 mg total) by mouth every 6 (six) hours as needed (Nausea or vomiting). 01/23/13  Yes Volanda Napoleon, MD  losartan (COZAAR) 25 MG tablet Take 25 mg by mouth daily.   Yes Historical Provider, MD  metoprolol tartrate (LOPRESSOR) 25 MG tablet Take 12.5 mg by mouth 2 (two) times daily.   Yes Historical Provider, MD  ondansetron (ZOFRAN) 8 MG tablet Take 1 tablet (8 mg total) by mouth 2 (two) times daily. Take two times a day starting the day after chemo for 3 days. Then take two times a day as needed for nausea or vomiting. 01/23/13  Yes Volanda Napoleon, MD  prochlorperazine (COMPAZINE) 10 MG tablet Take 1 tablet (10 mg total) by mouth every 6 (six) hours as needed (Nausea or vomiting). 01/23/13  Yes Volanda Napoleon, MD  Rivaroxaban (XARELTO) 20 MG TABS tablet Take 20 mg by mouth daily with supper. 12/10/12  Yes Volanda Napoleon, MD  senna (SENOKOT) 8.6 MG TABS Take 2 tablets by mouth at bedtime.   Yes Historical Provider, MD  sertraline (ZOLOFT) 100 MG tablet TAKE ONE TABLET BY MOUTH ONCE DAILY BEFORE BREAKFAST 03/01/13  Yes Mosie Lukes, MD  SPIRIVA HANDIHALER 18 MCG inhalation capsule Place 18 mcg into inhaler and inhale every morning.  02/17/13  Yes Historical Provider, MD  traMADol (ULTRAM) 50 MG tablet Take 50 mg by mouth every 6 (six) hours as needed for moderate pain or severe pain. 10/03/12  Yes Volanda Napoleon, MD  cefdinir (OMNICEF) 300 MG capsule Take 1 capsule (300 mg total) by mouth 2 (two) times daily. 03/17/13   Volanda Napoleon, MD   Allergies  Allergen Reactions  . Lyrica [Pregabalin] Hives and Itching    FAMILY HISTORY:  Family History  Problem Relation Age of Onset  . Arthritis Mother   . Heart failure Mother   . Hypertension Mother   . Diabetes Mother   . Heart failure Daughter     deceased age 58  . Other Daughter     deceased age 36 MVA   SOCIAL HISTORY:  reports that he has been smoking Cigarettes.  He  started smoking about 59 years ago. He has a 120 pack-year smoking history. He has never used smokeless tobacco. He reports that he  does not drink alcohol or use illicit drugs.  REVIEW OF SYSTEMS:  See HPI.  All systems reviewed and otherwise negative.   SUBJECTIVE:   VITAL SIGNS: Temp:  [96.5 F (35.8 C)-97 F (36.1 C)] 97 F (36.1 C) (03/25 2100) Pulse Rate:  [68-78] 74 (03/25 2230) Resp:  [18-24] 20 (03/25 2230) BP: (74-125)/(39-62) 104/53 mmHg (03/25 2230) SpO2:  [87 %-99 %] 96 % (03/25 2230) Weight:  [194 lb 0.1 oz (88 kg)] 194 lb 0.1 oz (88 kg) (03/25 1945)  INTAKE / OUTPUT: Intake/Output     03/25 0701 - 03/26 0700   I.V. (mL/kg) 3000 (34.1)   Total Intake(mL/kg) 3000 (34.1)   Urine (mL/kg/hr) 300   Total Output 300   Net +2700       Stool Occurrence 1 x     PHYSICAL EXAMINATION: General:  Chronically ill in NAD Neuro:  AAOx4, speech clear, MAE HEENT:  Mm pink/moist, no jvd Cardiovascular:  s1s2 rrr, no m/r/g Lungs:  resp's even/non-labored on 4L, lungs bilaterally with scattered wheezing  Abdomen:  Round/soft, bs4 active, mild tenderness to palpation lower abd Musculoskeletal:  No acute deformities  Skin:  Warm/dry, trace lower ext edema  LABS:  CBC  Recent Labs Lab 03/27/13 0850 04/02/13 1920  WBC 7.0 5.0  HGB 8.2* 11.1*  HCT 25.3* 32.5*  PLT 141* 137*   Coag's  Recent Labs Lab 04/02/13 1920  INR 1.27   BMET  Recent Labs Lab 03/27/13 0850 04/02/13 1920  NA 142 139  K 3.8 4.0  CL 105 99  CO2 28 22  BUN 17 28*  CREATININE 1.1 1.16  GLUCOSE 162* 168*   Electrolytes  Recent Labs Lab 03/27/13 0850 04/02/13 1920  CALCIUM 8.7 8.6   Sepsis Markers  Recent Labs Lab 04/02/13 1940  LATICACIDVEN 3.39*   ABG  Recent Labs Lab 04/02/13 2217  PHART 7.299*  PCO2ART 44.7  PO2ART 84.0   Liver Enzymes  Recent Labs Lab 03/27/13 0850 04/02/13 1920  AST 20 29  ALT 14 26  ALKPHOS 96* 98  BILITOT 0.50 0.6  ALBUMIN  --  2.7*    Cardiac Enzymes  Recent Labs Lab 04/02/13 1920  PROBNP 1477.0*   Glucose No results found for this basename: GLUCAP,  in the last 168 hours  Imaging Ct Head Wo Contrast  04/02/2013   CLINICAL DATA:  Hypertension.  Constipation.  Extremity weakness.  EXAM: CT HEAD WITHOUT CONTRAST  TECHNIQUE: Contiguous axial images were obtained from the base of the skull through the vertex without intravenous contrast.  COMPARISON:  Head MRI 05/18/2012  FINDINGS: There is mild cerebral atrophy, not significantly changed. Patchy hyperintensities in the cerebral white matter bilaterally are nonspecific but compatible with mild chronic small vessel ischemic disease. There is no evidence of acute cortical infarct, mass, midline shift, intracranial hemorrhage, or extra-axial fluid collection. Orbits are unremarkable. The mastoid air cells are clear. The right maxillary sinus is small and demonstrates moderate mucosal thickening, new from prior MRI.  IMPRESSION: No evidence of acute intracranial abnormality. Mild chronic small vessel ischemic disease.   Electronically Signed   By: Logan Bores   On: 04/02/2013 21:36   Ct Abdomen Pelvis W Contrast  04/02/2013   CLINICAL DATA:  Hypertension. Constipation. Extremity weakness. Mesothelioma.  EXAM: CT ABDOMEN AND PELVIS WITH CONTRAST  TECHNIQUE: Multidetector CT imaging of the abdomen and pelvis was performed using the standard protocol following bolus administration of intravenous contrast.  CONTRAST:  168mL OMNIPAQUE IOHEXOL 300 MG/ML  SOLN  COMPARISON:  PET-CT 03/03/2013  FINDINGS: Irregular pleural thickening is partially visualized in the right lung base, similar to the prior PET-CT. Loculated fluid in the right major fissure has increased, incompletely visualized. Mild dependent atelectasis is present in the left lower lobe. Three-vessel coronary artery calcifications are present.  Calcification in the inferior right hepatic lobe is unchanged. The gallbladder is  surgically absent. 2.4 cm soft tissue lesion adjacent to the upper pole of the right kidney is unchanged. 2.0 cm left upper pole renal cyst anteriorly is unchanged. The left kidney and adrenal glands are unremarkable. Small calcification in the pancreatic head is unchanged.  There is very mild mesenteric stranding adjacent to the distal descending colon in the left lower quadrant. Air-fluid levels are present in the colon, which is nondilated. No gross bowel wall thickening is identified. The appendix is identified in the right lower quadrant and is unremarkable. The small bowel is nondilated.  A Foley catheter is present in the bladder. No free fluid or enlarged lymph nodes are identified. Advanced atherosclerotic calcification is noted of the abdominal aorta and iliac arteries. Multiple healing, nondisplaced left-sided rib fractures are partially visualized as described on recent PET-CT. Mild T12 and moderate L1 compression fractures are unchanged.  IMPRESSION: 1. Fluid-filled colon with very mild inflammatory stranding around the distal descending colon. No definite bowel wall thickening is seen, however mild colitis is not excluded. 2. Irregular pleural thickening in the right lung base, consistent with known midtibial EOMI. Fluid in the right major fissure has increased from the prior PET-CT. 3. Unchanged soft tissue lesion adjacent to the upper pole of the right kidney.   Electronically Signed   By: Logan Bores   On: 04/02/2013 21:54   Dg Chest Port 1 View  04/02/2013   CLINICAL DATA:  Hypotension. Constipation and extremity weakness. History of mesothelioma per prior radiology exams.  EXAM: PORTABLE CHEST - 1 VIEW  COMPARISON:  NM PET IMAGE RESTAG (PS) SKULL BASE TO THIGH dated 03/03/2013; CT ANGIO CHEST W/CM &/OR WO/CM dated 02/10/2013; DG RIBS BILATERAL W/CHEST dated 02/06/2013  FINDINGS: 1945 hr. Right IJ power port tip appears unchanged at the SVC right atrial level. There is stable volume loss in the  right hemithorax with extensive pleural nodular thickening. Underlying interstitial prominence in both lungs has mildly increased. There is no confluent airspace opacity or significant pleural effusion. Surgical clips are noted at the thoracic inlet.  IMPRESSION: Similar overall appearance of the chest compared with available prior studies. There is mildly increased interstitial prominence in both lungs which could be related to lower lung volumes or mild edema. Nodular pleural thickening on the right remains corresponding with known mesothelioma.   Electronically Signed   By: Camie Patience M.D.   On: 04/02/2013 20:12    ASSESSMENT / PLAN:  A:  Volume Depletion - Neg UA, CXR, CT ABD/Pelvis.   Acute Syncopal Episode - likely vasovagal with straining for BM P:  -NS @ 75 -narrow abx to levaquin only with low threshold to d/c   Mesothelioma - with ongoing chemotherapy.  Has significant periods of weakness after therapy.   COPD with exacerbation  Oxygen Dependent - 4L at baseline Hx of OSA  P:   -spiriva, Brovana, solu-medrol, levaquin -oxygen to support sats 88-94% -pulmonary hygiene / IS -trend CXR -consider ambulatory desaturation prior to d/c to evaluate for adjustment needs -has not tolerated BiPAP at home, will hold inpatient therapy   A:   Constipation  P:   -  defer to primary for management     Noe Gens, NP-C [facility] Pgr: [phone] or [phone]  04/02/2013, 11:10 PM   Reviewed above, examined pt.  80 yo male with syncope likely from volume depletion and hypoxia in setting of AECOPD, hx of mesothelioma s/p chemo last week, and constipation with straining bowel movement.  Has wheeze on exam.  Will add solumedrol, and change Abx to levaquin for AECOPD.  Will use spiriva, pulmicort, brovana and prn albuterol until more stable.  Titrate oxygen to keep spo2 > 92%.  He has hx of OSA >> defer resuming BiPAP for now since he has not been using as  outpt.  PCCM will continue to follow as pulmonary consult.  Chesley Mires, MD [facility] 04/03/2013, 1:05 AM Pager:  [phone] After 3pm call: [phone]

## 2013-04-02 NOTE — ED Notes (Signed)
Dr. Rogene Houston notified of CG-4 result

## 2013-04-02 NOTE — ED Provider Notes (Addendum)
CSN: [id number]     Arrival date & time 04/02/13  1843 History   First MD Initiated Contact with Patient 04/02/13 1844     Chief Complaint  Patient presents with  . Hypotension  . Constipation  . Extremity Weakness     (Consider location/radiation/quality/duration/timing/severity/associated sxs/prior Treatment) The history is provided by the patient and the EMS personnel. The history is limited by the condition of the patient.   level V caveat applies due to the patient's condition hypotension and drowsiness. Brought in by EMS ""for constipation. However it was clear that he was hypotensive upon arrival EMS had blood pressure was 540 systolic in route. Patient states that he get lower bowel pain if you don't have a bowel movement he would feel much better. Patient did admit that he passed out at home. Patient states that he does have a history of lung cancer gets his chemotherapy at [facility]. Last treatment was a week ago. Actually 3 weeks ago started 3 months ago. This is for lung cancer. Patient's primary care doctors in [facility] area as well.  Past Medical History  Diagnosis Date  . Prostate enlargement   . GERD (gastroesophageal reflux disease)   . AAA (abdominal aortic aneurysm)     stress test 09/14/10 EPIC  . Anxiety   . Recurrent upper respiratory infection (URI)     productive cough- white phlegm- no fever  . Hypertension     chest x ray 12/12 EPIC- repeated 06/06/11, EKG 11/12 EPIC  . DVT (deep venous thrombosis) 08/2011  . Pneumonia   . Bruises easily     takes Xarelto  . Depression   . Dysrhythmia     hx of atrial fib post op x 2 days   . Atrial fibrillation   . Mesothelioma   . Arthritis     knee and back  . Sleep apnea      12/12 sleep study,SEVERE per study  Dr Lamonte Sakai- states doesnt wear machine on regular basis- setting BiPAP 9/9  . H/O hiatal hernia   . COPD (chronic obstructive pulmonary disease)     patient uses oxygen 2L/[place] at nite   . Epistaxis  05/13/2012  . Olecranon bursitis of right elbow 06/28/2012   Past Surgical History  Procedure Laterality Date  . Knee surgery  1957    left knee arthotomy  . Abdominal aortic aneurysm repair  11/14/2010    AAA Repair    Dr Donnetta Hutching  . Transurethral resection of prostate  01/25/2011    TURP  . Incisional hernia repair  06/12/2011    Procedure: HERNIA REPAIR INCISIONAL;  Surgeon: Earnstine Regal, MD;  Location: [facility];  Service: General;  Laterality: N/A;  Open Repair Ventral Incisional Hernia with Mesh  . Hernia repair    . Video assisted thoracoscopy  11/02/2011    Procedure: VIDEO ASSISTED THORACOSCOPY;  Surgeon: Melrose Nakayama, MD;  Location: [place];  Service: Thoracic;  Laterality: Right;  . Pleural biopsy  11/02/2011    Procedure: PLEURAL BIOPSY;  Surgeon: Melrose Nakayama, MD;  Location: [place];  Service: Thoracic;  Laterality: Right;  Parietal and visceral biopsies  . Chest tube insertion  11/02/2011    Procedure: INSERTION PLEURAL DRAINAGE CATHETER;  Surgeon: Melrose Nakayama, MD;  Location: [place];  Service: Thoracic;  Laterality: Right;  . Decortication  11/02/2011    Procedure: DECORTICATION;  Surgeon: Melrose Nakayama, MD;  Location: [place];  Service: Thoracic;  Laterality: Right;  .  Pleural effusion drainage  11/02/2011    Procedure: DRAINAGE OF PLEURAL EFFUSION;  Surgeon: Melrose Nakayama, MD;  Location: [place];  Service: Thoracic;  Laterality: Right;  . Cholecystectomy  01/19/2012    Procedure: LAPAROSCOPIC CHOLECYSTECTOMY WITH INTRAOPERATIVE CHOLANGIOGRAM;  Surgeon: Zenovia Jarred, MD;  Location: [place];  Service: General;  Laterality: N/A;  laparoscopic cholecystectomy with intraoperative cholangiogram  . Ercp  01/22/2012    Procedure: ENDOSCOPIC RETROGRADE CHOLANGIOPANCREATOGRAPHY (ERCP);  Surgeon: Inda Castle, MD;  Location: [place];  Service: Endoscopy;  Laterality: N/A;  with stent placement  . Ercp  01/21/2012    Procedure: ENDOSCOPIC RETROGRADE  CHOLANGIOPANCREATOGRAPHY (ERCP);  Surgeon: Milus Banister, MD;  Location: [place];  Service: Gastroenterology;  Laterality: N/A;  . Esophagogastroduodenoscopy  01/24/2012    Procedure: ESOPHAGOGASTRODUODENOSCOPY (EGD);  Surgeon: Inda Castle, MD;  Location: [place];  Service: Endoscopy;  Laterality: N/A;  remove pancr stent   . Endoscopic retrograde cholangiopancreatography (ercp) with propofol N/A 03/07/2012    Procedure: ENDOSCOPIC RETROGRADE CHOLANGIOPANCREATOGRAPHY (ERCP) WITH PROPOFOL;  Surgeon: Milus Banister, MD;  Location: [facility];  Service: Endoscopy;  Laterality: N/A;  remove stent  . Thyroidectomy N/A 04/04/2012    Procedure:  TOTAL THYROIDECTOMY WITH CENTRAL COMPARTMENT LYMPH NODE DISSECTION;  Surgeon: Earnstine Regal, MD;  Location: [facility];  Service: General;  Laterality: N/A;   Family History  Problem Relation Age of Onset  . Arthritis Mother   . Heart failure Mother   . Hypertension Mother   . Diabetes Mother   . Heart failure Daughter     deceased age 26  . Other Daughter     deceased age 30 MVA   History  Substance Use Topics  . Smoking status: Current Every Day Smoker -- 2.00 packs/day for 60 years    Types: Cigarettes    Start date: 02/12/1954  . Smokeless tobacco: Never Used     Comment: was on E cigarettes now back on regular cigarettes  . Alcohol Use: No    Review of Systems  Unable to perform ROS Gastrointestinal: Positive for abdominal pain and constipation.  Neurological: Positive for syncope.   level V caveat applies due to the patient's hypotension appears to be drowsy.    Allergies  Lyrica  Home Medications   Current Outpatient Rx  Name  Route  Sig  Dispense  Refill  . albuterol (PROVENTIL) (2.5 MG/3ML) 0.083% nebulizer solution   Nebulization   Take 3 mLs (2.5 mg total) by nebulization every 6 (six) hours as needed for wheezing.   360 mL   6   . albuterol (PROVENTIL) (2.5 MG/3ML) 0.083% nebulizer solution   Nebulization   Take 3  mLs (2.5 mg total) by nebulization every 6 (six) hours as needed for wheezing.   360 mL   6   . ALBUTEROL IN   Inhalation   Inhale into the lungs as needed.         . budesonide-formoterol (SYMBICORT) 160-4.5 MCG/ACT inhaler   Inhalation   Inhale 2 puffs into the lungs 2 (two) times daily.   1 Inhaler   6   . calcium carbonate (OS-CAL - DOSED IN MG OF ELEMENTAL CALCIUM) 1250 MG tablet   Oral   Take 2 tablets by mouth 2 (two) times daily.         . cefdinir (OMNICEF) 300 MG capsule   Oral   Take 1 capsule (300 mg total) by mouth 2 (two) times daily.   10 capsule  0   . Cholecalciferol (VITAMIN D3) 1000 UNITS CAPS   Oral   Take 1 capsule by mouth daily.         Marland Kitchen dexamethasone (DECADRON) 4 MG tablet   Oral   Take by mouth daily. Take 1 day pre chemo Tx and 5 days after Tx.         . folic acid (FOLVITE) 1 MG tablet   Oral   Take 1 tablet (1 mg total) by mouth daily. Take daily starting 5-7 days before Alimta chemotherapy. Continue until 21 days after Alimta completed.   100 tablet   3   . HYDROcodone-acetaminophen (NORCO/VICODIN) 5-325 MG per tablet   Oral   Take 1 tablet by mouth every 6 (six) hours as needed for moderate pain.   30 tablet   0   . levothyroxine (SYNTHROID, LEVOTHROID) 150 MCG tablet   Oral   Take 150 mcg by mouth daily before breakfast.         . lidocaine-prilocaine (EMLA) cream   Topical   Apply 1 application topically as needed.   30 g   3   . lisinopril (PRINIVIL,ZESTRIL) 10 MG tablet      TAKE ONE TABLET BY MOUTH TWICE DAILY   60 tablet   3   . LORazepam (ATIVAN) 0.5 MG tablet   Oral   Take 1 tablet (0.5 mg total) by mouth every 6 (six) hours as needed (Nausea or vomiting).   30 tablet   0   . losartan (COZAAR) 25 MG tablet   Oral   Take 25 mg by mouth daily.         . metoprolol tartrate (LOPRESSOR) 25 MG tablet   Oral   Take 12.5 mg by mouth 2 (two) times daily.         . ondansetron (ZOFRAN) 8 MG tablet    Oral   Take 1 tablet (8 mg total) by mouth 2 (two) times daily. Take two times a day starting the day after chemo for 3 days. Then take two times a day as needed for nausea or vomiting.   30 tablet   1   . prochlorperazine (COMPAZINE) 10 MG tablet   Oral   Take 1 tablet (10 mg total) by mouth every 6 (six) hours as needed (Nausea or vomiting).   30 tablet   1   . Rivaroxaban (XARELTO) 20 MG TABS tablet   Oral   Take 20 mg by mouth daily with supper.         . senna (SENOKOT) 8.6 MG TABS   Oral   Take 2 tablets by mouth at bedtime.         . sertraline (ZOLOFT) 100 MG tablet      TAKE ONE TABLET BY MOUTH ONCE DAILY BEFORE BREAKFAST   30 tablet   2   . SPIRIVA HANDIHALER 18 MCG inhalation capsule   Inhalation   Place 18 mcg into inhaler and inhale every morning.          . traMADol (ULTRAM) 50 MG tablet   Oral   Take 50 mg by mouth every 6 (six) hours as needed for moderate pain or severe pain.          There were no vitals taken for this visit. Physical Exam  ED Course  Procedures (including critical care time) Labs Review Labs Reviewed  CULTURE, BLOOD (ROUTINE X 2)  CULTURE, BLOOD (ROUTINE X 2)  URINALYSIS, ROUTINE W REFLEX MICROSCOPIC  COMPREHENSIVE METABOLIC PANEL  LIPASE, BLOOD  CBC WITH DIFFERENTIAL  PRO B NATRIURETIC PEPTIDE  PROTIME-INR  I-STAT CG4 LACTIC ACID, ED   Results for orders placed during the hospital encounter of 04/02/13  CBC WITH DIFFERENTIAL      Result Value Ref Range   WBC 5.0  4.0 - 10.5 K/uL   RBC 3.41 (*) 4.22 - 5.81 MIL/uL   Hemoglobin 11.1 (*) 13.0 - 17.0 g/dL   HCT 32.5 (*) 39.0 - 52.0 %   MCV 95.3  78.0 - 100.0 fL   MCH 32.6  26.0 - 34.0 pg   MCHC 34.2  30.0 - 36.0 g/dL   RDW 16.4 (*) 11.5 - 15.5 %   Platelets 137 (*) 150 - 400 K/uL   Neutrophils Relative % 78 (*) 43 - 77 %   Neutro Abs 3.9  1.7 - 7.7 K/uL   Lymphocytes Relative 21  12 - 46 %   Lymphs Abs 1.1  0.7 - 4.0 K/uL   Monocytes Relative 1 (*) 3 - 12 %    Monocytes Absolute 0.0 (*) 0.1 - 1.0 K/uL   Eosinophils Relative 0  0 - 5 %   Eosinophils Absolute 0.0  0.0 - 0.7 K/uL   Basophils Relative 0  0 - 1 %   Basophils Absolute 0.0  0.0 - 0.1 K/uL  PROTIME-INR      Result Value Ref Range   Prothrombin Time 15.6 (*) 11.6 - 15.2 seconds   INR 1.27  0.00 - 1.49  I-STAT CG4 LACTIC ACID, ED      Result Value Ref Range   Lactic Acid, Venous 3.39 (*) 0.5 - 2.2 mmol/L   Results for orders placed during the hospital encounter of 04/02/13  COMPREHENSIVE METABOLIC PANEL      Result Value Ref Range   Sodium 139  137 - 147 mEq/L   Potassium 4.0  3.7 - 5.3 mEq/L   Chloride 99  96 - 112 mEq/L   CO2 22  19 - 32 mEq/L   Glucose, Bld 168 (*) 70 - 99 mg/dL   BUN 28 (*) 6 - 23 mg/dL   Creatinine, Ser 1.16  0.50 - 1.35 mg/dL   Calcium 8.6  8.4 - 10.5 mg/dL   Total Protein 6.8  6.0 - 8.3 g/dL   Albumin 2.7 (*) 3.5 - 5.2 g/dL   AST PENDING  0 - 37 U/L   ALT 26  0 - 53 U/L   Alkaline Phosphatase 98  39 - 117 U/L   Total Bilirubin 0.6  0.3 - 1.2 mg/dL   GFR calc non Af Amer 59 (*) >90 mL/min   GFR calc Af Amer 68 (*) >90 mL/min  LIPASE, BLOOD      Result Value Ref Range   Lipase 27  11 - 59 U/L  CBC WITH DIFFERENTIAL      Result Value Ref Range   WBC 5.0  4.0 - 10.5 K/uL   RBC 3.41 (*) 4.22 - 5.81 MIL/uL   Hemoglobin 11.1 (*) 13.0 - 17.0 g/dL   HCT 32.5 (*) 39.0 - 52.0 %   MCV 95.3  78.0 - 100.0 fL   MCH 32.6  26.0 - 34.0 pg   MCHC 34.2  30.0 - 36.0 g/dL   RDW 16.4 (*) 11.5 - 15.5 %   Platelets 137 (*) 150 - 400 K/uL   Neutrophils Relative % 78 (*) 43 - 77 %   Neutro Abs 3.9  1.7 - 7.7 K/uL  Lymphocytes Relative 21  12 - 46 %   Lymphs Abs 1.1  0.7 - 4.0 K/uL   Monocytes Relative 1 (*) 3 - 12 %   Monocytes Absolute 0.0 (*) 0.1 - 1.0 K/uL   Eosinophils Relative 0  0 - 5 %   Eosinophils Absolute 0.0  0.0 - 0.7 K/uL   Basophils Relative 0  0 - 1 %   Basophils Absolute 0.0  0.0 - 0.1 K/uL  PRO B NATRIURETIC PEPTIDE      Result Value Ref Range    Pro B Natriuretic peptide (BNP) 1477.0 (*) 0 - 450 pg/mL  PROTIME-INR      Result Value Ref Range   Prothrombin Time 15.6 (*) 11.6 - 15.2 seconds   INR 1.27  0.00 - 1.49  I-STAT CG4 LACTIC ACID, ED      Result Value Ref Range   Lactic Acid, Venous 3.39 (*) 0.5 - 2.2 mmol/L     Imaging Review No results found.   EKG Interpretation None       Date: 04/02/2013  Rate: 71  Rhythm: normal sinus rhythm  QRS Axis: left  Intervals: normal  ST/T Wave abnormalities: nonspecific T wave changes  Conduction Disutrbances:left bundle branch block  Narrative Interpretation:   Old EKG Reviewed: none available  Did not cross in [place] Performed by: Mervin Kung. Total critical care time: 30 Critical care time was exclusive of separately billable procedures and treating other patients. Critical care was necessary to treat or prevent imminent or life-threatening deterioration. Critical care was time spent personally by me on the following activities: development of treatment plan with patient and/or surrogate as well as nursing, discussions with consultants, evaluation of patient's response to treatment, examination of patient, obtaining history from patient or surrogate, ordering and performing treatments and interventions, ordering and review of laboratory studies, ordering and review of radiographic studies, pulse oximetry and re-evaluation of patient's condition.   Chest x-ray is older concerning for perhaps an infiltrative process on the right side. Patient is a Port-A-Cath on that side. Not clear whether patient had a resection on that side.  MDM   Final diagnoses:  None    Patient brought in by EMS with the complaint of constipation. However family stated that patient became ill today was complaining of lower [place] pain. A syncopal episode. EMS reported the patient's blood pressure was 950 systolic. Upon arrival here her systolic blood pressures bilaterally  manually were in the 70s. Patient was not tachycardic. Patient was satting 88% on 2 L per them he found out he supposed to be on 3 L. Patient was increased to 3 L sats came up to the low 90s. Patient received 2 L of normal saline blood pressure came up to 932 systolic. Temp he had a low temperature of 96. Patient now little bit somnolent denied taking any pain medicines today. His Stating that he have a bowel movement he would feel better. Patient has a history of lung cancer he is undergoing chemotherapy last treatment was a week ago next treatment is 3 weeks from now. Patient beginning treatment for the past 3 months. Patient was fine yesterday. They deny any nausea or vomiting denies any chest pain. Patient started on a septic protocol without calling a code sepsis. The patient's lactic acid was greater than 2 but less than 4. Blood cultures done urine culture done and outpatient started on septic protocol antibiotics. We'll get head CT chest x-ray is still pending in addition  patient will need abdominal CT. Not able to drink contrast will need CT without oral contrast. Patient is normally followed at [facility] by his primary care doctor and by hematology oncology.   Mervin Kung, MD 04/02/13 2011  Mervin Kung, MD 04/02/13 2013

## 2013-04-02 NOTE — ED Notes (Signed)
Admitting MD at bedside.

## 2013-04-02 NOTE — ED Notes (Signed)
CT notified abdominal CT to be completed with IV contrast only per MD.

## 2013-04-02 NOTE — ED Notes (Signed)
IV team called to access port.

## 2013-04-02 NOTE — Progress Notes (Addendum)
ANTIBIOTIC CONSULT NOTE - INITIAL  Pharmacy Consult for vancomycin and Zosyn Indication: sepsis  Allergies  Allergen Reactions  . Lyrica [Pregabalin] Hives and Itching    Patient Measurements: Height: 5' 10.87" (180 cm) Weight: 194 lb 0.1 oz (88 kg) IBW/kg (Calculated) : 74.99  Vital Signs: Temp: 96.5 F (35.8 C) (03/25 1925) Temp src: Rectal (03/25 1925) BP: 111/55 mmHg (03/25 1945) Pulse Rate: 68 (03/25 1900)  Labs:  Recent Labs  04/02/13 1920  WBC 5.0  HGB 11.1*  PLT 137*   Estimated Creatinine Clearance: 59.7 ml/min (by C-G formula based on Cr of 1.1). No results found for this basename: VANCOTROUGH, VANCOPEAK, VANCORANDOM, GENTTROUGH, GENTPEAK, GENTRANDOM, TOBRATROUGH, TOBRAPEAK, TOBRARND, AMIKACINPEAK, AMIKACINTROU, AMIKACIN,  in the last 72 hours   Microbiology: No results found for this or any previous visit (from the past 720 hour(s)).  Medical History: Past Medical History  Diagnosis Date  . Prostate enlargement   . GERD (gastroesophageal reflux disease)   . AAA (abdominal aortic aneurysm)     stress test 09/14/10 EPIC  . Anxiety   . Recurrent upper respiratory infection (URI)     productive cough- white phlegm- no fever  . Hypertension     chest x ray 12/12 EPIC- repeated 06/06/11, EKG 11/12 EPIC  . DVT (deep venous thrombosis) 08/2011  . Pneumonia   . Bruises easily     takes Xarelto  . Depression   . Dysrhythmia     hx of atrial fib post op x 2 days   . Atrial fibrillation   . Mesothelioma   . Arthritis     knee and back  . Sleep apnea      12/12 sleep study,SEVERE per study  Dr Lamonte Sakai- states doesnt wear machine on regular basis- setting BiPAP 9/9  . H/O hiatal hernia   . COPD (chronic obstructive pulmonary disease)     patient uses oxygen 2L/[place] at nite   . Epistaxis 05/13/2012  . Olecranon bursitis of right elbow 06/28/2012    Medications:  Scheduled:   Infusions:  . sodium chloride 100 mL/hr at 04/02/13 1944  . piperacillin-tazobactam     . vancomycin     Assessment: 80 yo M presents to ED with constipation, weakness, and hypotension.  Pharmacy consulted to start vancomycin and Zosyn for sepsis.  Initial labs reveal WBC wnl and SCr 1.16 with estimated CrCl ~57.  Patient is currently hypothermic with temperature at 96.5.    Goal of Therapy:  Vancomycin trough level 15-20 mcg/ml Resolution of infection  Plan:  - give vancomycin IV 1750mg  loading dose in ED, followed by 750mg  q12h  - plan to draw [facility] at [facility] - give Zosyn IV 3.375g x1 infused over 51min in ED, followed by 3.375g q8h (4h extended infusion) - monitor kidney function, WBC, temperature curve, any cultures, and clinical progression  Ovid Curd E. Jacqlyn Larsen, PharmD Clinical Pharmacist - Resident Pager: [phone] Pharmacy: [phone] 04/02/2013 8:13 PM   Addum:  Change to levaquin for COPD exacerbation.  Levaquin 750 mg IV q24 hours.  Thanks for allowing pharmacy to be a part of this patient's care.  Excell Seltzer, PharmD Clinical Pharmacist, [phone]

## 2013-04-03 ENCOUNTER — Encounter (HOSPITAL_COMMUNITY): Payer: Self-pay | Admitting: Internal Medicine

## 2013-04-03 DIAGNOSIS — K921 Melena: Secondary | ICD-10-CM | POA: Diagnosis not present

## 2013-04-03 DIAGNOSIS — G4730 Sleep apnea, unspecified: Secondary | ICD-10-CM

## 2013-04-03 DIAGNOSIS — R55 Syncope and collapse: Secondary | ICD-10-CM | POA: Diagnosis present

## 2013-04-03 DIAGNOSIS — J441 Chronic obstructive pulmonary disease with (acute) exacerbation: Secondary | ICD-10-CM

## 2013-04-03 DIAGNOSIS — I959 Hypotension, unspecified: Secondary | ICD-10-CM

## 2013-04-03 DIAGNOSIS — C384 Malignant neoplasm of pleura: Secondary | ICD-10-CM

## 2013-04-03 DIAGNOSIS — D62 Acute posthemorrhagic anemia: Secondary | ICD-10-CM

## 2013-04-03 LAB — CBC WITH DIFFERENTIAL/PLATELET
BASOS ABS: 0 10*3/uL (ref 0.0–0.1)
Basophils Relative: 0 % (ref 0–1)
EOS ABS: 0 10*3/uL (ref 0.0–0.7)
Eosinophils Relative: 0 % (ref 0–5)
HEMATOCRIT: 25.7 % — AB (ref 39.0–52.0)
Hemoglobin: 8.7 g/dL — ABNORMAL LOW (ref 13.0–17.0)
LYMPHS PCT: 3 % — AB (ref 12–46)
Lymphs Abs: 0.2 10*3/uL — ABNORMAL LOW (ref 0.7–4.0)
MCH: 32.3 pg (ref 26.0–34.0)
MCHC: 33.9 g/dL (ref 30.0–36.0)
MCV: 95.5 fL (ref 78.0–100.0)
Monocytes Absolute: 0.1 10*3/uL (ref 0.1–1.0)
Monocytes Relative: 1 % — ABNORMAL LOW (ref 3–12)
NEUTROS ABS: 6.7 10*3/uL (ref 1.7–7.7)
Neutrophils Relative %: 96 % — ABNORMAL HIGH (ref 43–77)
PLATELETS: 85 10*3/uL — AB (ref 150–400)
RBC: 2.69 MIL/uL — ABNORMAL LOW (ref 4.22–5.81)
RDW: 16.8 % — AB (ref 11.5–15.5)
WBC: 7 10*3/uL (ref 4.0–10.5)

## 2013-04-03 LAB — COMPREHENSIVE METABOLIC PANEL
ALT: 20 U/L (ref 0–53)
AST: 18 U/L (ref 0–37)
Albumin: 2.2 g/dL — ABNORMAL LOW (ref 3.5–5.2)
Alkaline Phosphatase: 73 U/L (ref 39–117)
BILIRUBIN TOTAL: 0.4 mg/dL (ref 0.3–1.2)
BUN: 33 mg/dL — ABNORMAL HIGH (ref 6–23)
CO2: 22 meq/L (ref 19–32)
Calcium: 7.4 mg/dL — ABNORMAL LOW (ref 8.4–10.5)
Chloride: 104 mEq/L (ref 96–112)
Creatinine, Ser: 1.43 mg/dL — ABNORMAL HIGH (ref 0.50–1.35)
GFR calc Af Amer: 53 mL/min — ABNORMAL LOW (ref >90)
GFR calc non Af Amer: 46 mL/min — ABNORMAL LOW (ref >90)
GLUCOSE: 155 mg/dL — AB (ref 70–99)
POTASSIUM: 4.2 meq/L (ref 3.7–5.3)
Sodium: 140 mEq/L (ref 137–147)
Total Protein: 5.7 g/dL — ABNORMAL LOW (ref 6.0–8.3)

## 2013-04-03 LAB — CK: CK TOTAL: 44 U/L (ref 7–232)

## 2013-04-03 LAB — CBC
HCT: 23.2 % — ABNORMAL LOW (ref 39.0–52.0)
Hemoglobin: 7.9 g/dL — ABNORMAL LOW (ref 13.0–17.0)
MCH: 32.1 pg (ref 26.0–34.0)
MCHC: 34.1 g/dL (ref 30.0–36.0)
MCV: 94.3 fL (ref 78.0–100.0)
PLATELETS: 70 10*3/uL — AB (ref 150–400)
RBC: 2.46 MIL/uL — ABNORMAL LOW (ref 4.22–5.81)
RDW: 16.6 % — ABNORMAL HIGH (ref 11.5–15.5)
WBC: 6.9 10*3/uL (ref 4.0–10.5)

## 2013-04-03 LAB — TROPONIN I
Troponin I: <0.3 ng/mL (ref <0.30)
Troponin I: <0.3 ng/mL (ref <0.30)

## 2013-04-03 LAB — LACTIC ACID, PLASMA: Lactic Acid, Venous: 2.1 mmol/L (ref 0.5–2.2)

## 2013-04-03 LAB — TSH: TSH: 1.636 u[IU]/mL (ref 0.350–4.500)

## 2013-04-03 LAB — OCCULT BLOOD X 1 CARD TO LAB, STOOL: Fecal Occult Bld: POSITIVE — AB

## 2013-04-03 MED ORDER — METHYLPREDNISOLONE SODIUM SUCC 125 MG IJ SOLR
40.0000 mg | Freq: Four times a day (QID) | INTRAMUSCULAR | Status: DC
  Administered 2013-04-03: 40 mg via INTRAVENOUS
  Filled 2013-04-03: qty 2
  Filled 2013-04-03: qty 0.64

## 2013-04-03 MED ORDER — METHYLPREDNISOLONE SODIUM SUCC 40 MG IJ SOLR
40.0000 mg | Freq: Four times a day (QID) | INTRAMUSCULAR | Status: DC
  Administered 2013-04-03: 40 mg via INTRAVENOUS
  Filled 2013-04-03 (×5): qty 1

## 2013-04-03 MED ORDER — LORAZEPAM 0.5 MG PO TABS: 0.5000 mg | ORAL_TABLET | Freq: Four times a day (QID) | ORAL | Status: DC | PRN

## 2013-04-03 MED ORDER — FOLIC ACID 1 MG PO TABS
1.0000 mg | ORAL_TABLET | Freq: Every day | ORAL | Status: DC
  Administered 2013-04-03 – 2013-04-07 (×5): 1 mg via ORAL
  Filled 2013-04-03 (×5): qty 1

## 2013-04-03 MED ORDER — SODIUM CHLORIDE 0.9 % IV SOLN
INTRAVENOUS | Status: AC
  Administered 2013-04-03: 04:00:00 via INTRAVENOUS

## 2013-04-03 MED ORDER — ONDANSETRON HCL 4 MG PO TABS: 4.0000 mg | ORAL_TABLET | Freq: Four times a day (QID) | ORAL | Status: DC | PRN

## 2013-04-03 MED ORDER — ACETAMINOPHEN 650 MG RE SUPP
650.0000 mg | Freq: Four times a day (QID) | RECTAL | Status: DC | PRN
Start: 2013-04-03 — End: 2013-04-07

## 2013-04-03 MED ORDER — ACETAMINOPHEN 325 MG PO TABS: 650.0000 mg | ORAL_TABLET | Freq: Four times a day (QID) | ORAL | Status: DC | PRN

## 2013-04-03 MED ORDER — VITAMIN D 1000 UNITS PO TABS
1000.0000 [IU] | ORAL_TABLET | Freq: Every day | ORAL | Status: DC
  Administered 2013-04-03 – 2013-04-07 (×5): 1000 [IU] via ORAL
  Filled 2013-04-03 (×5): qty 1

## 2013-04-03 MED ORDER — BUDESONIDE 0.5 MG/2ML IN SUSP
0.5000 mg | Freq: Two times a day (BID) | RESPIRATORY_TRACT | Status: DC
  Administered 2013-04-03 – 2013-04-07 (×9): 0.5 mg via RESPIRATORY_TRACT
  Filled 2013-04-03 (×11): qty 2

## 2013-04-03 MED ORDER — TIOTROPIUM BROMIDE MONOHYDRATE 18 MCG IN CAPS
18.0000 ug | ORAL_CAPSULE | Freq: Every day | RESPIRATORY_TRACT | Status: DC
  Administered 2013-04-03 – 2013-04-07 (×5): 18 ug via RESPIRATORY_TRACT
  Filled 2013-04-03: qty 5

## 2013-04-03 MED ORDER — ONDANSETRON HCL 4 MG/2ML IJ SOLN: 4.0000 mg | Freq: Four times a day (QID) | INTRAMUSCULAR | Status: DC | PRN

## 2013-04-03 MED ORDER — SENNA 8.6 MG PO TABS
2.0000 | ORAL_TABLET | Freq: Every day | ORAL | Status: DC
  Administered 2013-04-06: 17.2 mg via ORAL
  Filled 2013-04-03 (×5): qty 2

## 2013-04-03 MED ORDER — SODIUM CHLORIDE 0.9 % IJ SOLN: 10.0000 mL | Freq: Two times a day (BID) | INTRAMUSCULAR | Status: DC

## 2013-04-03 MED ORDER — RIVAROXABAN 20 MG PO TABS
20.0000 mg | ORAL_TABLET | Freq: Every day | ORAL | Status: DC
  Filled 2013-04-03 (×2): qty 1

## 2013-04-03 MED ORDER — ONDANSETRON HCL 4 MG PO TABS: 8.0000 mg | ORAL_TABLET | Freq: Two times a day (BID) | ORAL | Status: DC | PRN

## 2013-04-03 MED ORDER — ARFORMOTEROL TARTRATE 15 MCG/2ML IN NEBU
15.0000 ug | INHALATION_SOLUTION | Freq: Two times a day (BID) | RESPIRATORY_TRACT | Status: DC
  Administered 2013-04-03 – 2013-04-07 (×9): 15 ug via RESPIRATORY_TRACT
  Filled 2013-04-03 (×12): qty 2

## 2013-04-03 MED ORDER — METOPROLOL TARTRATE 12.5 MG HALF TABLET
12.5000 mg | ORAL_TABLET | Freq: Two times a day (BID) | ORAL | Status: DC
  Administered 2013-04-03 – 2013-04-07 (×9): 12.5 mg via ORAL
  Filled 2013-04-03 (×10): qty 1

## 2013-04-03 MED ORDER — SERTRALINE HCL 100 MG PO TABS
100.0000 mg | ORAL_TABLET | Freq: Every day | ORAL | Status: DC
  Administered 2013-04-03 – 2013-04-07 (×5): 100 mg via ORAL
  Filled 2013-04-03 (×5): qty 1

## 2013-04-03 MED ORDER — ALBUTEROL SULFATE (2.5 MG/3ML) 0.083% IN NEBU
2.5000 mg | INHALATION_SOLUTION | RESPIRATORY_TRACT | Status: DC | PRN
  Administered 2013-04-03: 2.5 mg via RESPIRATORY_TRACT
  Filled 2013-04-03: qty 3

## 2013-04-03 MED ORDER — SODIUM CHLORIDE 0.9 % IJ SOLN
10.0000 mL | INTRAMUSCULAR | Status: DC | PRN
  Administered 2013-04-04: 10 mL
  Administered 2013-04-04: 20 mL
  Administered 2013-04-04 – 2013-04-07 (×4): 10 mL

## 2013-04-03 MED ORDER — PREDNISONE 20 MG PO TABS
40.0000 mg | ORAL_TABLET | Freq: Every day | ORAL | Status: DC
  Administered 2013-04-04 – 2013-04-07 (×4): 40 mg via ORAL
  Filled 2013-04-03 (×5): qty 2

## 2013-04-03 MED ORDER — LEVOTHYROXINE SODIUM 150 MCG PO TABS
150.0000 ug | ORAL_TABLET | Freq: Every day | ORAL | Status: DC
  Administered 2013-04-03 – 2013-04-07 (×5): 150 ug via ORAL
  Filled 2013-04-03 (×6): qty 1

## 2013-04-03 MED ORDER — TRAMADOL HCL 50 MG PO TABS
50.0000 mg | ORAL_TABLET | Freq: Four times a day (QID) | ORAL | Status: DC | PRN
Start: 2013-04-03 — End: 2013-04-07

## 2013-04-03 MED ORDER — POLYETHYLENE GLYCOL 3350 17 G PO PACK
17.0000 g | PACK | Freq: Every day | ORAL | Status: DC
  Administered 2013-04-06 – 2013-04-07 (×2): 17 g via ORAL
  Filled 2013-04-03 (×5): qty 1

## 2013-04-03 MED ORDER — LEVOFLOXACIN IN D5W 750 MG/150ML IV SOLN
750.0000 mg | INTRAVENOUS | Status: DC
  Administered 2013-04-03 – 2013-04-05 (×3): 750 mg via INTRAVENOUS
  Filled 2013-04-03 (×3): qty 150

## 2013-04-03 MED ORDER — CALCIUM CARBONATE 1250 (500 CA) MG PO TABS
2.0000 | ORAL_TABLET | Freq: Two times a day (BID) | ORAL | Status: DC
  Administered 2013-04-03 – 2013-04-07 (×9): 1000 mg via ORAL
  Filled 2013-04-03 (×11): qty 2

## 2013-04-03 NOTE — Consult Note (Addendum)
[facility] Consult: 11:50 AM 04/03/2013  LOS: 1 day    Referring Provider: Dr Cruzita Lederer Primary Care Physician:  Penni Homans, MD Primary Gastroenterologist:  Dr.     Luiz Iron for Consultation:  Hematochezia in pt on Xarelto   HPI: Steve [name] is a 80 y.o. male.  O2 dependent COPD, on Xarelto for hx DVT. Severe OSA, does not use bipap. S/p multiple surgeries.  Known to Dr Ardis Hughs and Deatra Ina since 01/2012.  Had ERCPs and biliary stent placement for possible bile duct leak.  This was following 01/19/12 lap chole with lysis of dense adhesions required to dissect out GB and repair of CHD with normal IOC. S/p 11/2010 AAA repair.  Undergoing chemo diagnosed 10/2011. s/p 3 cycles of carboplatin/Alimta began Jan 2015 for stage 3 right lung mesothelioma.  Not a surgical or radiation candidate. The chemo was delayed as MD felt he was asymptomatic but on 11/2012 PET the tumor had progressed.   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  MD transfused 2 units PRBCs last week for Hgb drop. Hgb 14 in January, near 12 in early February, down to 8.2 on 3/19. Pt had not received transfusion previously.   Suffers long-standing constipation, has BMs every 2 days and uses 2 colace daily and MOM every other day.  Usually has BM within 90 minutes of the MOM.  Yesterday took the MOM per routine.  He attempted to self disimpact and saw fresh blood on his finger.  Then went into bathroom, flanked by 2 family members for support as he felt week.  Proceeded to have syncopal spell but no BM.  Admitted to ED.  Had large BM last night, not bloody.   This AM significant amount of blood found staining the bed sheets, later had loose BM of brown stool mixed in with clots of blood. No nausea, no abdominal pain though often has discomfort in lower abdomen but this is not new. Never had colonoscopy.    CT abdomen shows fluid filled colon with very mild inflammatory stranding in distal descending colon, mild colitis not excluded.  Hgb dropped from 11.2 to 8.7 in less than 24 hours.    Currently pt feels back to his baseline     Past Medical History  Diagnosis Date  . Prostate enlargement   . GERD (gastroesophageal reflux disease)   . AAA (abdominal aortic aneurysm)     stress test 09/14/10 EPIC  . Anxiety   . Recurrent upper respiratory infection (URI)     productive cough- white phlegm- no fever  . Hypertension     chest x ray 12/12 EPIC- repeated 06/06/11, EKG 11/12 EPIC  . DVT (deep venous thrombosis) 08/2011  . Pneumonia   . Bruises easily     takes Xarelto  . Depression   . Dysrhythmia     hx of atrial fib post op x 2 days   .  Atrial fibrillation   . Mesothelioma   . Arthritis     knee and back  . Sleep apnea      12/12 sleep study,SEVERE per study  Dr Lamonte Sakai- states doesnt wear machine on regular basis- setting BiPAP 9/9  . H/O hiatal hernia   . COPD (chronic obstructive pulmonary disease)     patient uses oxygen 2L/[place] at nite   . Epistaxis 05/13/2012  . Olecranon bursitis of right elbow 06/28/2012    Past Surgical History  Procedure Laterality Date  . Knee surgery  1957    left knee arthotomy  . Abdominal aortic aneurysm repair  11/14/2010    AAA Repair    Dr Donnetta Hutching  . Transurethral resection of prostate  01/25/2011    TURP  . Incisional hernia repair  06/12/2011    Procedure: HERNIA REPAIR INCISIONAL;  Surgeon: Earnstine Regal, MD;  Location: [facility];  Service: General;  Laterality: N/A;  Open Repair Ventral Incisional Hernia with Mesh  . Hernia repair    . Video assisted thoracoscopy  11/02/2011    Procedure: VIDEO  ASSISTED THORACOSCOPY;  Surgeon: Melrose Nakayama, MD;  Location: [place];  Service: Thoracic;  Laterality: Right;  . Pleural biopsy  11/02/2011    Procedure: PLEURAL BIOPSY;  Surgeon: Melrose Nakayama, MD;  Location: [place];  Service: Thoracic;  Laterality: Right;  Parietal and visceral biopsies  . Chest tube insertion  11/02/2011    Procedure: INSERTION PLEURAL DRAINAGE CATHETER;  Surgeon: Melrose Nakayama, MD;  Location: [place];  Service: Thoracic;  Laterality: Right;  . Decortication  11/02/2011    Procedure: DECORTICATION;  Surgeon: Melrose Nakayama, MD;  Location: [place];  Service: Thoracic;  Laterality: Right;  . Pleural effusion drainage  11/02/2011    Procedure: DRAINAGE OF PLEURAL EFFUSION;  Surgeon: Melrose Nakayama, MD;  Location: [place];  Service: Thoracic;  Laterality: Right;  . Cholecystectomy  01/19/2012    Procedure: LAPAROSCOPIC CHOLECYSTECTOMY WITH INTRAOPERATIVE CHOLANGIOGRAM;  Surgeon: Zenovia Jarred, MD;  Location: [place];  Service: General;  Laterality: N/A;  laparoscopic cholecystectomy with intraoperative cholangiogram  . Ercp  01/22/2012    Procedure: ENDOSCOPIC RETROGRADE CHOLANGIOPANCREATOGRAPHY (ERCP);  Surgeon: Inda Castle, MD;  Location: [place];  Service: Endoscopy;  Laterality: N/A;  with stent placement  . Ercp  01/21/2012    Procedure: ENDOSCOPIC RETROGRADE CHOLANGIOPANCREATOGRAPHY (ERCP);  Surgeon: Milus Banister, MD;  Location: [place];  Service: Gastroenterology;  Laterality: N/A;  . Esophagogastroduodenoscopy  01/24/2012    Procedure: ESOPHAGOGASTRODUODENOSCOPY (EGD);  Surgeon: Inda Castle, MD;  Location: [place];  Service: Endoscopy;  Laterality: N/A;  remove pancr stent   . Endoscopic retrograde cholangiopancreatography (ercp) with propofol N/A 03/07/2012    Procedure: ENDOSCOPIC RETROGRADE CHOLANGIOPANCREATOGRAPHY (ERCP) WITH PROPOFOL;  Surgeon: Milus Banister, MD;  Location: [facility];  Service: Endoscopy;  Laterality: N/A;  remove  stent  . Thyroidectomy N/A 04/04/2012    Procedure:  TOTAL THYROIDECTOMY WITH CENTRAL COMPARTMENT LYMPH NODE DISSECTION;  Surgeon: Earnstine Regal, MD;  Location: [facility];  Service: General;  Laterality: N/A;    Prior to Admission medications   Medication Sig Start Date End Date Taking? Authorizing Provider  albuterol (PROVENTIL HFA;VENTOLIN HFA) 108 (90 BASE) MCG/ACT inhaler Inhale 1 puff into the lungs every 6 (six) hours as needed for shortness of breath.   Yes Historical Provider, MD  albuterol (PROVENTIL) (2.5 MG/3ML) 0.083% nebulizer solution Take 3 mLs (2.5 mg total) by nebulization every 6 (  six) hours as needed for wheezing. 03/24/13  Yes Collene Gobble, MD  albuterol (PROVENTIL) (2.5 MG/3ML) 0.083% nebulizer solution Take 3 mLs (2.5 mg total) by nebulization every 6 (six) hours as needed for wheezing. 03/24/13  Yes Collene Gobble, MD  budesonide-formoterol (SYMBICORT) 160-4.5 MCG/ACT inhaler Inhale 2 puffs into the lungs 2 (two) times daily. 03/24/13  Yes Collene Gobble, MD  calcium carbonate (OS-CAL - DOSED IN MG OF ELEMENTAL CALCIUM) 1250 MG tablet Take 2 tablets by mouth 2 (two) times daily. 04/05/12  Yes Earnstine Regal, MD  Cholecalciferol (VITAMIN D3) 1000 UNITS CAPS Take 1 capsule by mouth daily.   Yes Historical Provider, MD  dexamethasone (DECADRON) 4 MG tablet Take 4 mg by mouth See admin instructions. Take 1 day pre chemo Tx and 5 days after Tx. 01/17/13  Yes Historical Provider, MD  folic acid (FOLVITE) 1 MG tablet Take 1 tablet (1 mg total) by mouth daily. Take daily starting 5-7 days before Alimta chemotherapy. Continue until 21 days after Alimta completed. 01/23/13  Yes Volanda Napoleon, MD  levothyroxine (SYNTHROID, LEVOTHROID) 150 MCG tablet Take 150 mcg by mouth daily before breakfast.   Yes Historical Provider, MD  lidocaine-prilocaine (EMLA) cream Apply 1 application topically as needed. 01/21/13  Yes Volanda Napoleon, MD  lisinopril (PRINIVIL,ZESTRIL) 10 MG tablet TAKE ONE TABLET BY  MOUTH TWICE DAILY   Yes Mosie Lukes, MD  LORazepam (ATIVAN) 0.5 MG tablet Take 1 tablet (0.5 mg total) by mouth every 6 (six) hours as needed (Nausea or vomiting). 01/23/13  Yes Volanda Napoleon, MD  losartan (COZAAR) 25 MG tablet Take 25 mg by mouth daily.   Yes Historical Provider, MD  metoprolol tartrate (LOPRESSOR) 25 MG tablet Take 12.5 mg by mouth 2 (two) times daily.   Yes Historical Provider, MD  ondansetron (ZOFRAN) 8 MG tablet Take 1 tablet (8 mg total) by mouth 2 (two) times daily. Take two times a day starting the day after chemo for 3 days. Then take two times a day as needed for nausea or vomiting. 01/23/13  Yes Volanda Napoleon, MD  prochlorperazine (COMPAZINE) 10 MG tablet Take 1 tablet (10 mg total) by mouth every 6 (six) hours as needed (Nausea or vomiting). 01/23/13  Yes Volanda Napoleon, MD  Rivaroxaban (XARELTO) 20 MG TABS tablet Take 20 mg by mouth daily with supper. 12/10/12  Yes Volanda Napoleon, MD  senna (SENOKOT) 8.6 MG TABS Take 2 tablets by mouth at bedtime.   Yes Historical Provider, MD  sertraline (ZOLOFT) 100 MG tablet TAKE ONE TABLET BY MOUTH ONCE DAILY BEFORE BREAKFAST 03/01/13  Yes Mosie Lukes, MD  SPIRIVA HANDIHALER 18 MCG inhalation capsule Place 18 mcg into inhaler and inhale every morning.  02/17/13  Yes Historical Provider, MD  traMADol (ULTRAM) 50 MG tablet Take 50 mg by mouth every 6 (six) hours as needed for moderate pain or severe pain. 10/03/12  Yes Volanda Napoleon, MD  cefdinir (OMNICEF) 300 MG capsule Take 1 capsule (300 mg total) by mouth 2 (two) times daily. 03/17/13   Volanda Napoleon, MD    Scheduled Meds: . arformoterol  15 mcg Nebulization BID  . budesonide (PULMICORT) nebulizer solution  0.5 mg Nebulization BID  . calcium carbonate  2 tablet Oral BID WC  . cholecalciferol  1,000 Units Oral Daily  . folic acid  1 mg Oral Daily  . levofloxacin (LEVAQUIN) IV  750 mg Intravenous Q24H  . levothyroxine  150  mcg Oral QAC breakfast  . metoprolol tartrate   12.5 mg Oral BID  . [START ON 04/04/2013] predniSONE  40 mg Oral Q breakfast  . Rivaroxaban  20 mg Oral Q supper  . senna  2 tablet Oral QHS  . sertraline  100 mg Oral Daily  . sodium chloride  10-40 mL Intracatheter Q12H  . tiotropium  18 mcg Inhalation Daily   Infusions: . sodium chloride 100 mL/hr at 04/03/13 0352   PRN Meds: acetaminophen, acetaminophen, albuterol, LORazepam, ondansetron (ZOFRAN) IV, ondansetron, sodium chloride, traMADol   Allergies as of 04/02/2013 - Review Complete 04/02/2013  Allergen Reaction Noted  . Lyrica [pregabalin] Hives and Itching 09/14/2010    Family History  Problem Relation Age of Onset  . Arthritis Mother   . Heart failure Mother   . Hypertension Mother   . Diabetes Mother   . Heart failure Daughter     deceased age 28  . Other Daughter     deceased age 78 MVA    History   Social History  . Marital Status: Married    Spouse Name: N/A    Number of Children: 6  . Years of Education: N/A   Occupational History  . retired    Social History Main Topics  . Smoking status: Current Every Day Smoker -- 1.50 packs/day for 60 years    Types: Cigarettes    Start date: 02/12/1954  . Smokeless tobacco: Never Used     Comment: was on E cigarettes now back on regular cigarettes  . Alcohol Use: No  . Drug Use: No  . Sexual Activity: Not Currently   Other Topics Concern  . Not on file   Social History Narrative  . No narrative on file    REVIEW OF SYSTEMS: Constitutional:  Generally week but still able to live at home.  No weight loss. ENT:  No nose bleeds Pulm:  Still smoking 1 PPD.  CV:  No palpitations, no LE edema.  GU:  No hematuria, no frequency GI:  Nausea occurring 3 to 4 days post chemo Heme:  Per HPI   Transfusions:  Per HPI Neuro:  No headaches, no peripheral tingling or numbness Derm:  No itching, no rash or sores.  Endocrine:  No sweats or chills.  No polyuria or dysuria Immunization:  09/2012 flu shot  Travel:   None beyond local counties in last few months.    PHYSICAL EXAM: Vital signs in last 24 hours: Filed Vitals:   04/03/13 0618  BP: 111/59  Pulse: 63  Temp: 97.6 F (36.4 C)  Resp: 20   Wt Readings from Last 3 Encounters:  04/03/13 89.223 kg (196 lb 11.2 oz)  03/27/13 87.998 kg (194 lb)  03/24/13 87.998 kg (194 lb)    General: chronically unwell and frail looking elderly wm.  He is comfortalble Head:  No asymmetry or facial edema  Eyes:  No icterus or pallor Ears:  Slightly HOH  Nose:  Rhinophyma.  No congestion Mouth:  Clear, moist oral MM.  Tongue midline Neck:  No mass, no JVD.   Lungs:  Greatly diminished bil.  Dyspneic with chair to bed transfer,  Productive but tight cough.  [place] oxygen in place Heart: RRR.  No MRG.  NSR on monitor.  Abdomen:  Obese, soft, slightly distended.  Not tender, no masses, no bruits.  No HSM.   Rectal: external hemorrhoids, rock hard enlarged but smooth prostate.  No palpable stool.  Specs of stool on glove.  He is FOBT +   Musc/Skeltl: no joint swelling or contracture.  Extremities:  No pedal edema  Neurologic:  Oriented x 3.  No tremor.  No limb weakness. Skin:  No telangectasia, no rash Tattoos:  none Nodes:  No cervical adenopathy   Psych:  Pleasant, relaxed, not depressed.   Intake/Output from previous day: 03/25 0701 - 03/26 0700 In: 3333.3 [P.O.:120; I.V.:3213.3] Out: 300 [Urine:300] Intake/Output this shift: Total I/O In: 240 [P.O.:240] Out: -   LAB RESULTS:  Recent Labs  04/02/13 1920 04/03/13 0613  WBC 5.0 7.0  HGB 11.1* 8.7*  HCT 32.5* 25.7*  PLT 137* 85*   BMET Lab Results  Component Value Date   NA 140 04/03/2013   NA 139 04/02/2013   NA 142 03/27/2013   K 4.2 04/03/2013   K 4.0 04/02/2013   K 3.8 03/27/2013   CL 104 04/03/2013   CL 99 04/02/2013   CL 105 03/27/2013   CO2 22 04/03/2013   CO2 22 04/02/2013   CO2 28 03/27/2013   GLUCOSE 155* 04/03/2013   GLUCOSE 168* 04/02/2013   GLUCOSE 162* 03/27/2013   BUN 33*  04/03/2013   BUN 28* 04/02/2013   BUN 17 03/27/2013   CREATININE 1.43* 04/03/2013   CREATININE 1.16 04/02/2013   CREATININE 1.1 03/27/2013   CALCIUM 7.4* 04/03/2013   CALCIUM 8.6 04/02/2013   CALCIUM 8.7 03/27/2013   LFT  Recent Labs  04/02/13 1920 04/03/13 0613  PROT 6.8 5.7*  ALBUMIN 2.7* 2.2*  AST 29 18  ALT 26 20  ALKPHOS 98 73  BILITOT 0.6 0.4   PT/INR Lab Results  Component Value Date   INR 1.27 04/02/2013   INR 0.93 01/22/2013   INR 1.13 01/17/2012   Lipase     Component Value Date/Time   LIPASE 27 04/02/2013 1920    RADIOLOGY STUDIES: Ct Head Wo Contrast 04/02/2013     COMPARISON:  Head MRI 05/18/2012  FINDINGS: There is mild cerebral atrophy, not significantly changed. Patchy hyperintensities in the cerebral white matter bilaterally are nonspecific but compatible with mild chronic small vessel ischemic disease. There is no evidence of acute cortical infarct, mass, midline shift, intracranial hemorrhage, or extra-axial fluid collection. Orbits are unremarkable. The mastoid air cells are clear. The right maxillary sinus is small and demonstrates moderate mucosal thickening, new from prior MRI.  IMPRESSION: No evidence of acute intracranial abnormality. Mild chronic small vessel ischemic disease.   Electronically Signed   By: Logan Bores   On: 04/02/2013 21:36   Ct Abdomen Pelvis W Contrast 04/02/2013  FINDINGS: Irregular pleural thickening is partially visualized in the right lung base, similar to the prior PET-CT. Loculated fluid in the right major fissure has increased, incompletely visualized. Mild dependent atelectasis is present in the left lower lobe. Three-vessel coronary artery calcifications are present.  Calcification in the inferior right hepatic lobe is unchanged. The gallbladder is surgically absent. 2.4 cm soft tissue lesion adjacent to the upper pole of the right kidney is unchanged. 2.0 cm left upper pole renal cyst anteriorly is unchanged. The left kidney and adrenal  glands are unremarkable. Small calcification in the pancreatic head is unchanged.  There is very mild mesenteric stranding adjacent to the distal descending colon in the left lower quadrant. Air-fluid levels are present in the colon, which is nondilated. No gross bowel wall thickening is identified. The appendix is identified in the right lower quadrant and is unremarkable. The small bowel is nondilated.  A Foley catheter is  present in the bladder. No free fluid or enlarged lymph nodes are identified. Advanced atherosclerotic calcification is noted of the abdominal aorta and iliac arteries. Multiple healing, nondisplaced left-sided rib fractures are partially visualized as described on recent PET-CT. Mild T12 and moderate L1 compression fractures are unchanged.  IMPRESSION: 1. Fluid-filled colon with very mild inflammatory stranding around the distal descending colon. No definite bowel wall thickening is seen, however mild colitis is not excluded. 2. Irregular pleural thickening in the right lung base, consistent with known midtibial EOMI. Fluid in the right major fissure has increased from the prior PET-CT. 3. Unchanged soft tissue lesion adjacent to the upper pole of the right kidney.   Electronically Signed   By: Logan Bores   On: 04/02/2013 21:54   Dg Chest Port 1 View 04/02/2013   CLINICAL DATA:  Hypotension. Constipation and extremity weakness. History of mesothelioma per prior radiology exams.  EXAM: PORTABLE CHEST - 1 VIEW  COMPARISON:  NM PET IMAGE RESTAG (PS) SKULL BASE TO THIGH dated 03/03/2013; CT ANGIO CHEST W/CM &/OR WO/CM dated 02/10/2013; DG RIBS BILATERAL W/CHEST dated 02/06/2013  FINDINGS: 1945 hr. Right IJ power port tip appears unchanged at the SVC right atrial level. There is stable volume loss in the right hemithorax with extensive pleural nodular thickening. Underlying interstitial prominence in both lungs has mildly increased. There is no confluent airspace opacity or significant pleural  effusion. Surgical clips are noted at the thoracic inlet.  IMPRESSION: Similar overall appearance of the chest compared with available prior studies. There is mildly increased interstitial prominence in both lungs which could be related to lower lung volumes or mild edema. Nodular pleural thickening on the right remains corresponding with known mesothelioma.   Electronically Signed   By: Camie Patience M.D.   On: 04/02/2013 20:12    ENDOSCOPIC STUDIES: No previous colonoscopy  02/26/2012 ERCP  Dr Ardis Hughs Removal of biliary stent, no leak seen  01/24/12  EGD  Dr Deatra Ina Biliary stent in place. PD stent absent  01/21/13  ERCP  Dr Deatra Ina Sphincerotomy, no debris seen on sweep.  Stents placed to PD and CBD.  No leak mentioned.    01/21/12  ERCP  Dr Ardis Hughs Incomplete study, unable to cannulate CBD    IMPRESSION:   *  Hematochezia.  Rule out rectal abrasion after self disimpaction, rule out stercoral ulcer, rule out ischemic colitis (doubt given the constipation and lack of significant or new abdominal pain)  *  Anemia.  Just transfused last week with 2 PRBCs for anemia.  Now with recurrent ABL anemia.  *  Chronic constipation.  Long standing.  *  Syncopal event while having BM, felt to be vaso-vagal.  CT head with small vessel disease, nothing acute.   *  Hx DVR, chronic Xarelto not currently on hold  *  Mesothelioma.  Undergoing chemo starting about one month ago.   *  COPD, acute exacerbation per CCM:  On Levaquin  *  Hyperglycemia in setting of Prednisone burst.  *  Hx bile duct injury during lap chole in 01/2012.  S/p ERCP with sphinct and stent placement, stent subsequently removed.      PLAN:     *  Normally would pursue colonoscopy (pt has never had one) but his lung disease is advanced and he is high risk for sedation.  *  Adding Miralax daily. *  CBC at 1700 and in AM, transfuse prn.  *  Do we need to hold Xarelto?  Azucena Freed  04/03/2013, 11:50 AM Pager:  [phone]      GI Attending  I have also seen and assessed the patient and agree with the above note. Elderly man with chronic constipation and numerous serious co-morbities, with hematochezia and acute blood loss anemia - on xarelto. He also has thrombocytopenia. He got dosed with that again at 1700.  Will reassess - anticipate conservative Tx at this time - may need a sigmoidoscopy depending upon clinical course. Have ordered Pt/PTT in Am also as PT prolonged today.   Gatha Mayer, MD, [facility] [phone] (pager) 04/03/2013 5:47 PM

## 2013-04-03 NOTE — Progress Notes (Signed)
Admitted pt to rm 2W37 from ED, pt alert and oriented, denies pain at this time, oriented to room, call bell placed within reach, admission assessment done, orders carried out. Daughter at bedside. Will continue to monitor. Filed Vitals:   04/03/13 0320  BP: 93/50  Pulse: 61  Temp: 98 F (36.7 C)  Resp: 20   Vashawn Ekstein [place]

## 2013-04-03 NOTE — Progress Notes (Signed)
Patient seen and examined this morning, agree with H&P. Per RN, with 2 bloody BMs this morning; he is on anticoagulation. Has anemia, to some degree also related to his chemotherapy.  - consult GI - COPD stable, no wheezing  Costin M. Cruzita Lederer, MD [facility] [phone]

## 2013-04-03 NOTE — Progress Notes (Signed)
Utilization Review Completed.Donne Anon T3/26/2015

## 2013-04-03 NOTE — H&P (Addendum)
[facility] History and Physical  Steve [name] DJM:[id number] DOB: 1933/07/15 DOA: 04/02/2013  Referring physician: ER physician. PCP: Penni Homans, MD  Chief Complaint: Weakness loss of consciousness.  HPI: Steve [name] is a 80 y.o. male history of mesothelioma on chemotherapy, COPD on home oxygen, OSA, atrial fibrillation, abdominal aortic aneurysm has been experiencing constipation last 2 days and has taken milk of magnesia and started having bowel movements when patient while straining in the bathroom passed out. In addition patient's daughter found the patient was getting hypoxic. Patient was brought to the ER and was found to be hypotensive lactic acid levels are found to be elevated. Initially critical care was consulted with concerns for sepsis. At this time patient symptoms were felt to be secondary to dehydration and syncope was most likely secondary to straining during bowel movement. Patient denies any chest pain. Has been having some productive cough and wheezing. Denies any focal deficits headache abdominal pain nausea vomiting.   Review of Systems: As presented in the history of presenting illness, rest negative.  Past Medical History  Diagnosis Date  . Prostate enlargement   . GERD (gastroesophageal reflux disease)   . AAA (abdominal aortic aneurysm)     stress test 09/14/10 [facility]  . Anxiety   . Recurrent upper respiratory infection (URI)     productive cough- white phlegm- no fever  . Hypertension     chest x ray 12/12 EPIC- repeated 06/06/11, EKG 11/12 EP[facility]  . DVT (deep venous thrombosis) 08/2011  . Pneumonia   . Bruises easily     takes Xarelto  . Depression   . Dysrhythmia     hx of atrial fib post op x 2 days   . Atrial fibrillation   . Mesothelioma   . Arthritis     knee and back  . Sleep apnea      12/12 sleep study,SEVERE per study  Dr Lamonte Sakai- states doesnt wear machine on regular basis- setting BiPAP 9/9  . H/O hiatal hernia   . COPD (chronic  obstructive pulmonary disease)     patient uses oxygen 2L/ at nite   . Epistaxis 05/13/2012  . Olecranon bursitis of right elbow 06/28/2012   Past Surgical History  Procedure Laterality Date  . Knee surgery  1957    left knee arthotomy  . Abdominal aortic aneurysm repair  11/14/2010    AAA Repair    Dr Donnetta Hutching  . Transurethral resection of prostate  01/25/2011    TURP  . Incisional hernia repair  06/12/2011    Procedure: HERNIA REPAIR INCISIONAL;  Surgeon: Earnstine Regal, MD;  Location: [facility];  Service: General;  Laterality: N/A;  Open Repair Ventral Incisional Hernia with Mesh  . Hernia repair    . Video assisted thoracoscopy  11/02/2011    Procedure: VIDEO ASSISTED THORACOSCOPY;  Surgeon: Melrose Nakayama, MD;  Location: [place];  Service: Thoracic;  Laterality: Right;  . Pleural biopsy  11/02/2011    Procedure: PLEURAL BIOPSY;  Surgeon: Melrose Nakayama, MD;  Location: [place];  Service: Thoracic;  Laterality: Right;  Parietal and visceral biopsies  . Chest tube insertion  11/02/2011    Procedure: INSERTION PLEURAL DRAINAGE CATHETER;  Surgeon: Melrose Nakayama, MD;  Location: [place];  Service: Thoracic;  Laterality: Right;  . Decortication  11/02/2011    Procedure: DECORTICATION;  Surgeon: Melrose Nakayama, MD;  Location: [place];  Service: Thoracic;  Laterality: Right;  . Pleural effusion drainage  11/02/2011  Procedure: DRAINAGE OF PLEURAL EFFUSION;  Surgeon: Melrose Nakayama, MD;  Location: [place];  Service: Thoracic;  Laterality: Right;  . Cholecystectomy  01/19/2012    Procedure: LAPAROSCOPIC CHOLECYSTECTOMY WITH INTRAOPERATIVE CHOLANGIOGRAM;  Surgeon: Zenovia Jarred, MD;  Location: [place];  Service: General;  Laterality: N/A;  laparoscopic cholecystectomy with intraoperative cholangiogram  . Ercp  01/22/2012    Procedure: ENDOSCOPIC RETROGRADE CHOLANGIOPANCREATOGRAPHY (ERCP);  Surgeon: Inda Castle, MD;  Location: [place];  Service: Endoscopy;  Laterality: N/A;  with  stent placement  . Ercp  01/21/2012    Procedure: ENDOSCOPIC RETROGRADE CHOLANGIOPANCREATOGRAPHY (ERCP);  Surgeon: Milus Banister, MD;  Location: [place];  Service: Gastroenterology;  Laterality: N/A;  . Esophagogastroduodenoscopy  01/24/2012    Procedure: ESOPHAGOGASTRODUODENOSCOPY (EGD);  Surgeon: Inda Castle, MD;  Location: [place];  Service: Endoscopy;  Laterality: N/A;  remove pancr stent   . Endoscopic retrograde cholangiopancreatography (ercp) with propofol N/A 03/07/2012    Procedure: ENDOSCOPIC RETROGRADE CHOLANGIOPANCREATOGRAPHY (ERCP) WITH PROPOFOL;  Surgeon: Milus Banister, MD;  Location: [facility];  Service: Endoscopy;  Laterality: N/A;  remove stent  . Thyroidectomy N/A 04/04/2012    Procedure:  TOTAL THYROIDECTOMY WITH CENTRAL COMPARTMENT LYMPH NODE DISSECTION;  Surgeon: Earnstine Regal, MD;  Location: [facility];  Service: General;  Laterality: N/A;   Social History:  reports that he has been smoking Cigarettes.  He started smoking about 59 years ago. He has a 120 pack-year smoking history. He has never used smokeless tobacco. He reports that he does not drink alcohol or use illicit drugs. Where does patient live home. Can patient participate in ADLs? Yes.  Allergies  Allergen Reactions  . Lyrica [Pregabalin] Hives and Itching    Family History:  Family History  Problem Relation Age of Onset  . Arthritis Mother   . Heart failure Mother   . Hypertension Mother   . Diabetes Mother   . Heart failure Daughter     deceased age 79  . Other Daughter     deceased age 29 MVA      Prior to Admission medications   Medication Sig Start Date End Date Taking? Authorizing Provider  albuterol (PROVENTIL HFA;VENTOLIN HFA) 108 (90 BASE) MCG/ACT inhaler Inhale 1 puff into the lungs every 6 (six) hours as needed for shortness of breath.   Yes Historical Provider, MD  albuterol (PROVENTIL) (2.5 MG/3ML) 0.083% nebulizer solution Take 3 mLs (2.5 mg total) by nebulization every 6 (six)  hours as needed for wheezing. 03/24/13  Yes Collene Gobble, MD  albuterol (PROVENTIL) (2.5 MG/3ML) 0.083% nebulizer solution Take 3 mLs (2.5 mg total) by nebulization every 6 (six) hours as needed for wheezing. 03/24/13  Yes Collene Gobble, MD  budesonide-formoterol (SYMBICORT) 160-4.5 MCG/ACT inhaler Inhale 2 puffs into the lungs 2 (two) times daily. 03/24/13  Yes Collene Gobble, MD  calcium carbonate (OS-CAL - DOSED IN MG OF ELEMENTAL CALCIUM) 1250 MG tablet Take 2 tablets by mouth 2 (two) times daily. 04/05/12  Yes Earnstine Regal, MD  Cholecalciferol (VITAMIN D3) 1000 UNITS CAPS Take 1 capsule by mouth daily.   Yes Historical Provider, MD  dexamethasone (DECADRON) 4 MG tablet Take 4 mg by mouth See admin instructions. Take 1 day pre chemo Tx and 5 days after Tx. 01/17/13  Yes Historical Provider, MD  folic acid (FOLVITE) 1 MG tablet Take 1 tablet (1 mg total) by mouth daily. Take daily starting 5-7 days before Alimta chemotherapy. Continue until 21 days after Alimta  completed. 01/23/13  Yes Volanda Napoleon, MD  levothyroxine (SYNTHROID, LEVOTHROID) 150 MCG tablet Take 150 mcg by mouth daily before breakfast.   Yes Historical Provider, MD  lidocaine-prilocaine (EMLA) cream Apply 1 application topically as needed. 01/21/13  Yes Volanda Napoleon, MD  lisinopril (PRINIVIL,ZESTRIL) 10 MG tablet TAKE ONE TABLET BY MOUTH TWICE DAILY   Yes Mosie Lukes, MD  LORazepam (ATIVAN) 0.5 MG tablet Take 1 tablet (0.5 mg total) by mouth every 6 (six) hours as needed (Nausea or vomiting). 01/23/13  Yes Volanda Napoleon, MD  losartan (COZAAR) 25 MG tablet Take 25 mg by mouth daily.   Yes Historical Provider, MD  metoprolol tartrate (LOPRESSOR) 25 MG tablet Take 12.5 mg by mouth 2 (two) times daily.   Yes Historical Provider, MD  ondansetron (ZOFRAN) 8 MG tablet Take 1 tablet (8 mg total) by mouth 2 (two) times daily. Take two times a day starting the day after chemo for 3 days. Then take two times a day as needed for nausea or  vomiting. 01/23/13  Yes Volanda Napoleon, MD  prochlorperazine (COMPAZINE) 10 MG tablet Take 1 tablet (10 mg total) by mouth every 6 (six) hours as needed (Nausea or vomiting). 01/23/13  Yes Volanda Napoleon, MD  Rivaroxaban (XARELTO) 20 MG TABS tablet Take 20 mg by mouth daily with supper. 12/10/12  Yes Volanda Napoleon, MD  senna (SENOKOT) 8.6 MG TABS Take 2 tablets by mouth at bedtime.   Yes Historical Provider, MD  sertraline (ZOLOFT) 100 MG tablet TAKE ONE TABLET BY MOUTH ONCE DAILY BEFORE BREAKFAST 03/01/13  Yes Mosie Lukes, MD  SPIRIVA HANDIHALER 18 MCG inhalation capsule Place 18 mcg into inhaler and inhale every morning.  02/17/13  Yes Historical Provider, MD  traMADol (ULTRAM) 50 MG tablet Take 50 mg by mouth every 6 (six) hours as needed for moderate pain or severe pain. 10/03/12  Yes Volanda Napoleon, MD  cefdinir (OMNICEF) 300 MG capsule Take 1 capsule (300 mg total) by mouth 2 (two) times daily. 03/17/13   Volanda Napoleon, MD    Physical Exam: Filed Vitals:   04/02/13 2200 04/02/13 2230 04/02/13 2320 04/02/13 2355  BP: 106/54 104/53 99/43 95/51   Pulse: 72 74 69 63  Temp:      TempSrc:      Resp: 24 20 17 14   Height:      Weight:      SpO2: 92% 96% 92% 91%     General:  Well-developed and nourished.  Eyes: Anicteric no pallor.  ENT: No discharge from the ears eyes nose mouth.  Neck: No mass felt.  Cardiovascular: S1-S2 heard.  Respiratory: Bilateral expiratory wheezes heard no crepitations.  Abdomen: Soft nontender bowel sounds present.  Skin: No rash.  Musculoskeletal: No edema.  Psychiatric: Appears normal.  Neurologic: Alert awake oriented to time place and person. Moves all extremities.  Labs on Admission:  Basic Metabolic Panel:  Recent Labs Lab 03/27/13 0850 04/02/13 1920  NA 142 139  K 3.8 4.0  CL 105 99  CO2 28 22  GLUCOSE 162* 168*  BUN 17 28*  CREATININE 1.1 1.16  CALCIUM 8.7 8.6   Liver Function Tests:  Recent Labs Lab 03/27/13 0850  04/02/13 1920  AST 20 29  ALT 14 26  ALKPHOS 96* 98  BILITOT 0.50 0.6  PROT 7.9 6.8  ALBUMIN  --  2.7*    Recent Labs Lab 04/02/13 1920  LIPASE 27   No results found  for this basename: AMMONIA,  in the last 168 hours CBC:  Recent Labs Lab 03/27/13 0850 04/02/13 1920  WBC 7.0 5.0  NEUTROABS 6.1 3.9  HGB 8.2* 11.1*  HCT 25.3* 32.5*  MCV 100* 95.3  PLT 141* 137*   Cardiac Enzymes: No results found for this basename: CKTOTAL, CKMB, CKMBINDEX, TROPONINI,  in the last 168 hours  BNP (last 3 results)  Recent Labs  02/10/13 1808 04/02/13 1920  PROBNP 856.8* 1477.0*   CBG: No results found for this basename: GLUCAP,  in the last 168 hours  Radiological Exams on Admission: Ct Head Wo Contrast  04/02/2013   CLINICAL DATA:  Hypertension.  Constipation.  Extremity weakness.  EXAM: CT HEAD WITHOUT CONTRAST  TECHNIQUE: Contiguous axial images were obtained from the base of the skull through the vertex without intravenous contrast.  COMPARISON:  Head MRI 05/18/2012  FINDINGS: There is mild cerebral atrophy, not significantly changed. Patchy hyperintensities in the cerebral white matter bilaterally are nonspecific but compatible with mild chronic small vessel ischemic disease. There is no evidence of acute cortical infarct, mass, midline shift, intracranial hemorrhage, or extra-axial fluid collection. Orbits are unremarkable. The mastoid air cells are clear. The right maxillary sinus is small and demonstrates moderate mucosal thickening, new from prior MRI.  IMPRESSION: No evidence of acute intracranial abnormality. Mild chronic small vessel ischemic disease.   Electronically Signed   By: Logan Bores   On: 04/02/2013 21:36   Ct Abdomen Pelvis W Contrast  04/02/2013   CLINICAL DATA:  Hypertension. Constipation. Extremity weakness. Mesothelioma.  EXAM: CT ABDOMEN AND PELVIS WITH CONTRAST  TECHNIQUE: Multidetector CT imaging of the abdomen and pelvis was performed using the standard  protocol following bolus administration of intravenous contrast.  CONTRAST:  17mL OMNIPAQUE IOHEXOL 300 MG/ML  SOLN  COMPARISON:  PET-CT 03/03/2013  FINDINGS: Irregular pleural thickening is partially visualized in the right lung base, similar to the prior PET-CT. Loculated fluid in the right major fissure has increased, incompletely visualized. Mild dependent atelectasis is present in the left lower lobe. Three-vessel coronary artery calcifications are present.  Calcification in the inferior right hepatic lobe is unchanged. The gallbladder is surgically absent. 2.4 cm soft tissue lesion adjacent to the upper pole of the right kidney is unchanged. 2.0 cm left upper pole renal cyst anteriorly is unchanged. The left kidney and adrenal glands are unremarkable. Small calcification in the pancreatic head is unchanged.  There is very mild mesenteric stranding adjacent to the distal descending colon in the left lower quadrant. Air-fluid levels are present in the colon, which is nondilated. No gross bowel wall thickening is identified. The appendix is identified in the right lower quadrant and is unremarkable. The small bowel is nondilated.  A Foley catheter is present in the bladder. No free fluid or enlarged lymph nodes are identified. Advanced atherosclerotic calcification is noted of the abdominal aorta and iliac arteries. Multiple healing, nondisplaced left-sided rib fractures are partially visualized as described on recent PET-CT. Mild T12 and moderate L1 compression fractures are unchanged.  IMPRESSION: 1. Fluid-filled colon with very mild inflammatory stranding around the distal descending colon. No definite bowel wall thickening is seen, however mild colitis is not excluded. 2. Irregular pleural thickening in the right lung base, consistent with known midtibial EOMI. Fluid in the right major fissure has increased from the prior PET-CT. 3. Unchanged soft tissue lesion adjacent to the upper pole of the right kidney.    Electronically Signed   By: Logan Bores  On: 04/02/2013 21:54   Dg Chest Port 1 View  04/02/2013   CLINICAL DATA:  Hypotension. Constipation and extremity weakness. History of mesothelioma per prior radiology exams.  EXAM: PORTABLE CHEST - 1 VIEW  COMPARISON:  NM PET IMAGE RESTAG (PS) SKULL BASE TO THIGH dated 03/03/2013; CT ANGIO CHEST W/CM &/OR WO/CM dated 02/10/2013; DG RIBS BILATERAL W/CHEST dated 02/06/2013  FINDINGS: 1945 hr. Right IJ power port tip appears unchanged at the SVC right atrial level. There is stable volume loss in the right hemithorax with extensive pleural nodular thickening. Underlying interstitial prominence in both lungs has mildly increased. There is no confluent airspace opacity or significant pleural effusion. Surgical clips are noted at the thoracic inlet.  IMPRESSION: Similar overall appearance of the chest compared with available prior studies. There is mildly increased interstitial prominence in both lungs which could be related to lower lung volumes or mild edema. Nodular pleural thickening on the right remains corresponding with known mesothelioma.   Electronically Signed   By: Camie Patience M.D.   On: 04/02/2013 20:12     Assessment/Plan Principal Problem:   Hypotension Active Problems:   Hypertension   Atrial fibrillation   Sleep apnea   Mesothelioma (pleural)   Hypokalemia   COPD exacerbation   Syncope   1. Hypotension probably secondary to dehydration - hold antihypertensives and continue hydration. Patient has received 3 L normal saline bolus. May also need stress dose steroids. Patient is on steroids for COPD exacerbation. 2. Syncope - probably from straining during bowel movement and also from hypotension. Continue to observe. 3. COPD exacerbation - patient has been placed on Levaquin steroids and nebulizer per her pulmonologist. 4. Thrombocytopenia and anemia probably from chemotherapy - closely follow CBC. May need transfusion if there is further  drop. 5. History of DVT/factor V Leyden mutation and lupus anticoagulant positive - xarelto. 6. History of hypertension - holding antihypertensives due to hypotension. 7. History of abdominal artery aneurysm - denies any abdominal pain. 8. Mesothelioma - per oncologist. 9. OSA - pulmonologist following.  I have discussed with pulmonary critical care. EKG and cardiac markers are pending. Patient does have history of atrial fibrillation.  Code Status: Full code.  Family Communication: None.  Disposition Plan: Admit to inpatient.    Harlen Danford N. [facility] Pager [phone].  If 7PM-7AM, please contact night-coverage [email or web address] Password [facility] 04/03/2013, 1:57 AM

## 2013-04-03 NOTE — Consult Note (Signed)
PULMONARY / CRITICAL CARE MEDICINE  Name: Steve [name] MRN: [id number] DOB: 04-02-1933    ADMISSION DATE:  04/02/2013 CONSULTATION DATE:  04/02/2013   REFERRING MD :  EDP PRIMARY SERVICE: [facility]  CHIEF COMPLAINT:  Concern for sepsis  BRIEF PATIENT DESCRIPTION: 80 yo with COPD and Mesothelioma on chemotherapy who presented to [facility] ED on 3/25 with hypotension and encephalopathy after passing out while trying to use the restroom .     SIGNIFICANT EVENTS / STUDIES:  3/25  Admited after syncopal episode while trying to use restroom  3/25  Head CT >>> nad 3/25  Abdomen CT >>> Fluid filled colon with very mild inflammatory stranding in distal descending colon, unchanged soft tissue lesion adjacent to upper pole of the R kidney, irregular pleural thickening in R base c/w known mesothelioma   CULTURES: 3/25  Blood >>>  ANTIBIOTICS: Vanco (empiric) 3/25 >>> Zosyn (empiric) 3/25 >>>  INTERVAL HISTORY:  No acute overnight events.  VITAL SIGNS: Temp:  [96.5 F (35.8 C)-98 F (36.7 C)] 97.6 F (36.4 C) (03/26 0618) Pulse Rate:  [60-78] 63 (03/26 0618) Resp:  [14-24] 20 (03/26 0618) BP: (74-125)/(39-62) 111/59 mmHg (03/26 0618) SpO2:  [87 %-99 %] 97 % (03/26 0738) Weight:  [88 kg (194 lb 0.1 oz)-89.223 kg (196 lb 11.2 oz)] 89.223 kg (196 lb 11.2 oz) (03/26 0320)  INTAKE / OUTPUT: Intake/Output     03/25 0701 - 03/26 0700 03/26 0701 - 03/27 0700   P.O. 120    I.V. (mL/kg) 3213.3 (36)    Total Intake(mL/kg) 3333.3 (37.4)    Urine (mL/kg/hr) 300    Total Output 300     Net +3033.3          Stool Occurrence 1 x      PHYSICAL EXAMINATION: General:  No distress Neuro:  Awake, alert HEENT:  No JVD Cardiovascular:  Regular, no murmurs Lungs:  Bilateral diminished air entry, few expiratory wheezes Abdomen:  Soft, mild generalized tenderness Musculoskeletal:  Trace edema Skin:  No rash  LABS:  CBC  Recent Labs Lab 04/02/13 1920 04/03/13 0613  WBC 5.0 7.0  HGB 11.1* 8.7*  HCT  32.5* 25.7*  PLT 137* 85*   Coag's  Recent Labs Lab 04/02/13 1920  INR 1.27   BMET  Recent Labs Lab 04/02/13 1920 04/03/13 0613  NA 139 140  K 4.0 4.2  CL 99 104  CO2 22 22  BUN 28* 33*  CREATININE 1.16 1.43*  GLUCOSE 168* 155*   Electrolytes  Recent Labs Lab 04/02/13 1920 04/03/13 0613  CALCIUM 8.6 7.4*   Sepsis Markers  Recent Labs Lab 04/02/13 1940 04/02/13 2310 04/03/13 0613  LATICACIDVEN 3.39* 3.2* 2.1   ABG  Recent Labs Lab 04/02/13 2217  PHART 7.299*  PCO2ART 44.7  PO2ART 84.0   Liver Enzymes  Recent Labs Lab 04/02/13 1920 04/03/13 0613  AST 29 18  ALT 26 20  ALKPHOS 98 73  BILITOT 0.6 0.4  ALBUMIN 2.7* 2.2*   Cardiac Enzymes  Recent Labs Lab 04/02/13 1920 04/03/13 0613  TROPONINI  --  <0.30  PROBNP 1477.0*  --    Glucose No results found for this basename: GLUCAP,  in the last 168 hours  IMAGING:  Ct Head Wo Contrast  04/02/2013   CLINICAL DATA:  Hypertension.  Constipation.  Extremity weakness.  EXAM: CT HEAD WITHOUT CONTRAST  TECHNIQUE: Contiguous axial images were obtained from the base of the skull through the vertex without intravenous contrast.  COMPARISON:  Head  MRI 05/18/2012  FINDINGS: There is mild cerebral atrophy, not significantly changed. Patchy hyperintensities in the cerebral white matter bilaterally are nonspecific but compatible with mild chronic small vessel ischemic disease. There is no evidence of acute cortical infarct, mass, midline shift, intracranial hemorrhage, or extra-axial fluid collection. Orbits are unremarkable. The mastoid air cells are clear. The right maxillary sinus is small and demonstrates moderate mucosal thickening, new from prior MRI.  IMPRESSION: No evidence of acute intracranial abnormality. Mild chronic small vessel ischemic disease.   Electronically Signed   By: Logan Bores   On: 04/02/2013 21:36   Ct Abdomen Pelvis W Contrast  04/02/2013   CLINICAL DATA:  Hypertension. Constipation.  Extremity weakness. Mesothelioma.  EXAM: CT ABDOMEN AND PELVIS WITH CONTRAST  TECHNIQUE: Multidetector CT imaging of the abdomen and pelvis was performed using the standard protocol following bolus administration of intravenous contrast.  CONTRAST:  129mL OMNIPAQUE IOHEXOL 300 MG/ML  SOLN  COMPARISON:  PET-CT 03/03/2013  FINDINGS: Irregular pleural thickening is partially visualized in the right lung base, similar to the prior PET-CT. Loculated fluid in the right major fissure has increased, incompletely visualized. Mild dependent atelectasis is present in the left lower lobe. Three-vessel coronary artery calcifications are present.  Calcification in the inferior right hepatic lobe is unchanged. The gallbladder is surgically absent. 2.4 cm soft tissue lesion adjacent to the upper pole of the right kidney is unchanged. 2.0 cm left upper pole renal cyst anteriorly is unchanged. The left kidney and adrenal glands are unremarkable. Small calcification in the pancreatic head is unchanged.  There is very mild mesenteric stranding adjacent to the distal descending colon in the left lower quadrant. Air-fluid levels are present in the colon, which is nondilated. No gross bowel wall thickening is identified. The appendix is identified in the right lower quadrant and is unremarkable. The small bowel is nondilated.  A Foley catheter is present in the bladder. No free fluid or enlarged lymph nodes are identified. Advanced atherosclerotic calcification is noted of the abdominal aorta and iliac arteries. Multiple healing, nondisplaced left-sided rib fractures are partially visualized as described on recent PET-CT. Mild T12 and moderate L1 compression fractures are unchanged.  IMPRESSION: 1. Fluid-filled colon with very mild inflammatory stranding around the distal descending colon. No definite bowel wall thickening is seen, however mild colitis is not excluded. 2. Irregular pleural thickening in the right lung base, consistent  with known midtibial EOMI. Fluid in the right major fissure has increased from the prior PET-CT. 3. Unchanged soft tissue lesion adjacent to the upper pole of the right kidney.   Electronically Signed   By: Logan Bores   On: 04/02/2013 21:54   Dg Chest Port 1 View  04/02/2013   CLINICAL DATA:  Hypotension. Constipation and extremity weakness. History of mesothelioma per prior radiology exams.  EXAM: PORTABLE CHEST - 1 VIEW  COMPARISON:  NM PET IMAGE RESTAG (PS) SKULL BASE TO THIGH dated 03/03/2013; CT ANGIO CHEST W/CM &/OR WO/CM dated 02/10/2013; DG RIBS BILATERAL W/CHEST dated 02/06/2013  FINDINGS: 1945 hr. Right IJ power port tip appears unchanged at the SVC right atrial level. There is stable volume loss in the right hemithorax with extensive pleural nodular thickening. Underlying interstitial prominence in both lungs has mildly increased. There is no confluent airspace opacity or significant pleural effusion. Surgical clips are noted at the thoracic inlet.  IMPRESSION: Similar overall appearance of the chest compared with available prior studies. There is mildly increased interstitial prominence in both lungs which could  be related to lower lung volumes or mild edema. Nodular pleural thickening on the right remains corresponding with known mesothelioma.   Electronically Signed   By: Camie Patience M.D.   On: 04/02/2013 20:12   ASSESSMENT / PLAN:  COPD with acute exacerbation  Supplemental oxygen  Albuterol / Garlon Hatchet / Spiriva / Pulmicort  D/c Solu-Medrol  Start Prednison 40 daily  Levaquin  OSA  Defer BiPAP as not compliant preadmission  Mesothelioma   On chemotherapy  I have personally obtained history, examined patient, evaluated and interpreted laboratory and imaging results, reviewed medical records, formulated assessment / plan and placed orders.  Doree Fudge, MD [facility] [place] Pager: [phone]  04/03/2013, 10:08 AM

## 2013-04-04 DIAGNOSIS — Z86718 Personal history of other venous thrombosis and embolism: Secondary | ICD-10-CM

## 2013-04-04 DIAGNOSIS — D649 Anemia, unspecified: Secondary | ICD-10-CM

## 2013-04-04 DIAGNOSIS — D6859 Other primary thrombophilia: Secondary | ICD-10-CM

## 2013-04-04 LAB — BASIC METABOLIC PANEL
BUN: 27 mg/dL — ABNORMAL HIGH (ref 6–23)
CALCIUM: 8.3 mg/dL — AB (ref 8.4–10.5)
CO2: 24 mEq/L (ref 19–32)
CREATININE: 1.1 mg/dL (ref 0.50–1.35)
Chloride: 105 mEq/L (ref 96–112)
GFR calc Af Amer: 73 mL/min — ABNORMAL LOW (ref >90)
GFR calc non Af Amer: 63 mL/min — ABNORMAL LOW (ref >90)
GLUCOSE: 76 mg/dL (ref 70–99)
Potassium: 3.9 mEq/L (ref 3.7–5.3)
Sodium: 142 mEq/L (ref 137–147)

## 2013-04-04 LAB — CBC
HCT: 21.3 % — ABNORMAL LOW (ref 39.0–52.0)
Hemoglobin: 7.4 g/dL — ABNORMAL LOW (ref 13.0–17.0)
MCH: 32.6 pg (ref 26.0–34.0)
MCHC: 34.7 g/dL (ref 30.0–36.0)
MCV: 93.8 fL (ref 78.0–100.0)
PLATELETS: 63 10*3/uL — AB (ref 150–400)
RBC: 2.27 MIL/uL — ABNORMAL LOW (ref 4.22–5.81)
RDW: 16.5 % — AB (ref 11.5–15.5)
WBC: 5.9 10*3/uL (ref 4.0–10.5)

## 2013-04-04 LAB — APTT: aPTT: 33 seconds (ref 24–37)

## 2013-04-04 LAB — PROTIME-INR
INR: 1.07 (ref 0.00–1.49)
Prothrombin Time: 13.7 seconds (ref 11.6–15.2)

## 2013-04-04 LAB — PREPARE RBC (CROSSMATCH)

## 2013-04-04 MED ORDER — HYDRALAZINE HCL 20 MG/ML IJ SOLN
10.0000 mg | Freq: Four times a day (QID) | INTRAMUSCULAR | Status: DC | PRN
  Administered 2013-04-04 – 2013-04-07 (×3): 10 mg via INTRAVENOUS
  Filled 2013-04-04 (×2): qty 1

## 2013-04-04 MED ORDER — NICOTINE 21 MG/24HR TD PT24
21.0000 mg | MEDICATED_PATCH | Freq: Every day | TRANSDERMAL | Status: DC
  Administered 2013-04-04 – 2013-04-07 (×4): 21 mg via TRANSDERMAL
  Filled 2013-04-04 (×4): qty 1

## 2013-04-04 MED ORDER — FUROSEMIDE 10 MG/ML IJ SOLN
30.0000 mg | Freq: Once | INTRAMUSCULAR | Status: AC
  Administered 2013-04-05: 30 mg via INTRAVENOUS
  Filled 2013-04-04: qty 4

## 2013-04-04 NOTE — Progress Notes (Signed)
PROGRESS NOTE  Steve [name] ZOX:[id number] DOB: 1933-03-22 DOA: 04/02/2013 PCP: Penni Homans, MD  Assessment/Plan: Hypotension - likely due to dehydration, possible lower GI bleed as below, has received bolus in the emergency room and his hypotension has now resolved. Syncope - patient currently asymptomatic, likely vasovagal with a possible component of lower GI bleed. Lower GI bleed - still with mild bloody bowel movement this morning, hemoglobin trended down to 7.4, transfuse one unit. Yesterday neurology has been consulted yesterday, appreciate input. Anticoagulation was on hold since admission. History of DVT/lupus anticoagulant positive - discussed case with Dr. Marin Olp today from oncology. Has had history of right lower extremity DVT and has been on anticoagulations for a few months. We'll repeat a lower extremity DVT to better determine his current anticoagulation / IVC filter needs. Appreciate hematology input. He had a CT angiogram of the chest in February of 2015 which was negative for pulmonary embolism.  HTN - blood pressure start to trend up. Will continue to hold antihypertensives for today AAA - no abdominal pain Anemia - likely multifactorial due to ongoing chemotherapy, cancer, GI bleed. Hemoglobin trending down to 7.4 this morning. Transfuse one unit of packed red blood cells. AECOPD - Levofloxacin, steroids and breathing treatments. Improving, patient without any wheezing. Mesothelioma of the right lung, stage III - currently undergoing chemotherapy.  [facility] - pulmonology seeing patient while hospitalized.  Diet: Heart healthy Fluids: None DVT Prophylaxis: SCDs  Code Status: Full code Family Communication: Discussed with patient  Disposition Plan: Inpatient  Consultants:  Gastroenterology  Pulmonology  Oncology  Procedures:  None   Antibiotics Levofloxacin 3/26>>  HPI/Subjective: Feeling well this morning, no lightheadedness or dizziness, denies any  abdominal pain nausea vomiting or diarrhea. Breathing comfortable, at baseline  Objective: Filed Vitals:   04/03/13 1324 04/03/13 2200 04/03/13 2248 04/04/13 0428  BP: 144/72 153/60  141/71  Pulse: 61 68 68 63  Temp: 98.4 F (36.9 C) 98 F (36.7 C)  97.6 F (36.4 C)  TempSrc: Oral Oral  Oral  Resp: 18 20 20 18   Height:      Weight:      SpO2: 97% 96% 96% 97%    Intake/Output Summary (Last 24 hours) at 04/04/13 1105 Last data filed at 04/04/13 0730  Gross per 24 hour  Intake    843 ml  Output   1850 ml  Net  -1007 ml   Filed Weights   04/02/13 1945 04/03/13 0320  Weight: 88 kg (194 lb 0.1 oz) 89.223 kg (196 lb 11.2 oz)   Exam:  General:  NAD  Cardiovascular: regular rate and rhythm, without MRG  Respiratory: No wheezing, overall decreased breath sounds throughout.  Abdomen: soft, not tender to palpation, positive bowel sounds  MSK: no peripheral edema  Neuro: Grossly nonfocal  Data Reviewed: Basic Metabolic Panel:  Recent Labs Lab 04/02/13 1920 04/03/13 0613 04/04/13 0500  NA 139 140 142  K 4.0 4.2 3.9  CL 99 104 105  CO2 22 22 24   GLUCOSE 168* 155* 76  BUN 28* 33* 27*  CREATININE 1.16 1.43* 1.10  CALCIUM 8.6 7.4* 8.3*   Liver Function Tests:  Recent Labs Lab 04/02/13 1920 04/03/13 0613  AST 29 18  ALT 26 20  ALKPHOS 98 73  BILITOT 0.6 0.4  PROT 6.8 5.7*  ALBUMIN 2.7* 2.2*    Recent Labs Lab 04/02/13 1920  LIPASE 27   CBC:  Recent Labs Lab 04/02/13 1920 04/03/13 0613 04/03/13 1830 04/04/13 0500  WBC  5.0 7.0 6.9 5.9  NEUTROABS 3.9 6.7  --   --   HGB 11.1* 8.7* 7.9* 7.4*  HCT 32.5* 25.7* 23.2* 21.3*  MCV 95.3 95.5 94.3 93.8  PLT 137* 85* 70* 63*   Cardiac Enzymes:  Recent Labs Lab 04/03/13 0613 04/03/13 1120  CKTOTAL  --  81  TROPONINI <0.30 <0.30   BNP (last 3 results)  Recent Labs  02/10/13 1808 04/02/13 1920  PROBNP 856.8* 1477.0*   Recent Results (from the past 240 hour(s))  CULTURE, BLOOD (ROUTINE X 2)      Status: None   Collection Time    04/02/13  7:20 PM      Result Value Ref Range Status   Specimen Description BLOOD RIGHT ARM   Final   Special Requests BOTTLES DRAWN AEROBIC AND ANAEROBIC 5CC   Final   Culture  Setup Time     Final   Value: 04/03/2013 00:31     Performed at [facility]   Culture     Final   Value:        BLOOD CULTURE RECEIVED NO GROWTH TO DATE CULTURE WILL BE HELD FOR 5 DAYS BEFORE ISSUING A FINAL NEGATIVE REPORT     Performed at [facility]   Report Status PENDING   Incomplete  CULTURE, BLOOD (ROUTINE X 2)     Status: None   Collection Time    04/02/13  8:04 PM      Result Value Ref Range Status   Specimen Description BLOOD HAND RIGHT   Final   Special Requests BOTTLES DRAWN AEROBIC ONLY 6.5CC   Final   Culture  Setup Time     Final   Value: 04/03/2013 00:33     Performed at [facility]   Culture     Final   Value:        BLOOD CULTURE RECEIVED NO GROWTH TO DATE CULTURE WILL BE HELD FOR 5 DAYS BEFORE ISSUING A FINAL NEGATIVE REPORT     Performed at [facility]   Report Status PENDING   Incomplete     Studies: Ct Head Wo Contrast  04/02/2013   CLINICAL DATA:  Hypertension.  Constipation.  Extremity weakness.  EXAM: CT HEAD WITHOUT CONTRAST  TECHNIQUE: Contiguous axial images were obtained from the base of the skull through the vertex without intravenous contrast.  COMPARISON:  Head MRI 05/18/2012  FINDINGS: There is mild cerebral atrophy, not significantly changed. Patchy hyperintensities in the cerebral white matter bilaterally are nonspecific but compatible with mild chronic small vessel ischemic disease. There is no evidence of acute cortical infarct, mass, midline shift, intracranial hemorrhage, or extra-axial fluid collection. Orbits are unremarkable. The mastoid air cells are clear. The right maxillary sinus is small and demonstrates moderate mucosal thickening, new from prior MRI.  IMPRESSION: No evidence of acute  intracranial abnormality. Mild chronic small vessel ischemic disease.   Electronically Signed   By: Logan Bores   On: 04/02/2013 21:36   Ct Abdomen Pelvis W Contrast  04/02/2013   CLINICAL DATA:  Hypertension. Constipation. Extremity weakness. Mesothelioma.  EXAM: CT ABDOMEN AND PELVIS WITH CONTRAST  TECHNIQUE: Multidetector CT imaging of the abdomen and pelvis was performed using the standard protocol following bolus administration of intravenous contrast.  CONTRAST:  188mL OMNIPAQUE IOHEXOL 300 MG/ML  SOLN  COMPARISON:  PET-CT 03/03/2013  FINDINGS: Irregular pleural thickening is partially visualized in the right lung base, similar to the prior PET-CT. Loculated fluid in the right major  fissure has increased, incompletely visualized. Mild dependent atelectasis is present in the left lower lobe. Three-vessel coronary artery calcifications are present.  Calcification in the inferior right hepatic lobe is unchanged. The gallbladder is surgically absent. 2.4 cm soft tissue lesion adjacent to the upper pole of the right kidney is unchanged. 2.0 cm left upper pole renal cyst anteriorly is unchanged. The left kidney and adrenal glands are unremarkable. Small calcification in the pancreatic head is unchanged.  There is very mild mesenteric stranding adjacent to the distal descending colon in the left lower quadrant. Air-fluid levels are present in the colon, which is nondilated. No gross bowel wall thickening is identified. The appendix is identified in the right lower quadrant and is unremarkable. The small bowel is nondilated.  A Foley catheter is present in the bladder. No free fluid or enlarged lymph nodes are identified. Advanced atherosclerotic calcification is noted of the abdominal aorta and iliac arteries. Multiple healing, nondisplaced left-sided rib fractures are partially visualized as described on recent PET-CT. Mild T12 and moderate L1 compression fractures are unchanged.  IMPRESSION: 1. Fluid-filled  colon with very mild inflammatory stranding around the distal descending colon. No definite bowel wall thickening is seen, however mild colitis is not excluded. 2. Irregular pleural thickening in the right lung base, consistent with known midtibial EOMI. Fluid in the right major fissure has increased from the prior PET-CT. 3. Unchanged soft tissue lesion adjacent to the upper pole of the right kidney.   Electronically Signed   By: Logan Bores   On: 04/02/2013 21:54   Dg Chest Port 1 View  04/02/2013   CLINICAL DATA:  Hypotension. Constipation and extremity weakness. History of mesothelioma per prior radiology exams.  EXAM: PORTABLE CHEST - 1 VIEW  COMPARISON:  NM PET IMAGE RESTAG (PS) SKULL BASE TO THIGH dated 03/03/2013; CT ANGIO CHEST W/CM &/OR WO/CM dated 02/10/2013; DG RIBS BILATERAL W/CHEST dated 02/06/2013  FINDINGS: 1945 hr. Right IJ power port tip appears unchanged at the SVC right atrial level. There is stable volume loss in the right hemithorax with extensive pleural nodular thickening. Underlying interstitial prominence in both lungs has mildly increased. There is no confluent airspace opacity or significant pleural effusion. Surgical clips are noted at the thoracic inlet.  IMPRESSION: Similar overall appearance of the chest compared with available prior studies. There is mildly increased interstitial prominence in both lungs which could be related to lower lung volumes or mild edema. Nodular pleural thickening on the right remains corresponding with known mesothelioma.   Electronically Signed   By: Camie Patience M.D.   On: 04/02/2013 20:12    Scheduled Meds: . arformoterol  15 mcg Nebulization BID  . budesonide (PULMICORT) nebulizer solution  0.5 mg Nebulization BID  . calcium carbonate  2 tablet Oral BID WC  . cholecalciferol  1,000 Units Oral Daily  . folic acid  1 mg Oral Daily  . levofloxacin (LEVAQUIN) IV  750 mg Intravenous Q24H  . levothyroxine  150 mcg Oral QAC breakfast  . metoprolol  tartrate  12.5 mg Oral BID  . polyethylene glycol  17 g Oral Daily  . predniSONE  40 mg Oral Q breakfast  . senna  2 tablet Oral QHS  . sertraline  100 mg Oral Daily  . sodium chloride  10-40 mL Intracatheter Q12H  . tiotropium  18 mcg Inhalation Daily   Continuous Infusions:   Principal Problem:   Hypotension Active Problems:   Hypertension   Atrial fibrillation   Sleep apnea  Mesothelioma (pleural)   Hypokalemia   COPD exacerbation   Syncope   Hematochezia   Acute posthemorrhagic anemia  Time spent: 35  This note has been created with Surveyor, quantity. Any transcriptional errors are unintentional.   Marzetta Board, MD [facility] Pager [phone]. If 7 PM - 7 AM, please contact night-coverage at [email or web address], password [facility] 04/04/2013, 11:05 AM  LOS: 2 days

## 2013-04-04 NOTE — Progress Notes (Signed)
Daily Rounding Note  04/04/2013, 9:02 AM  LOS: 2 days   SUBJECTIVE:       Formed, large, brown BM recorded at 2100 yesterday. Red stool reported around midnight, pt says it was small.  Feels well.  Not dizzy.  Appetite fair. Breathing at baseline with [place] oxygen.   OBJECTIVE:         Vital signs in last 24 hours:    Temp:  [97.6 F (36.4 C)-98.4 F (36.9 C)] 97.6 F (36.4 C) (03/27 0428) Pulse Rate:  [61-68] 63 (03/27 0428) Resp:  [18-20] 18 (03/27 0428) BP: (141-153)/(60-72) 141/71 mmHg (03/27 0428) SpO2:  [96 %-97 %] 97 % (03/27 0428) Last BM Date: 04/03/13 General: looks unwell but breathing better than yeasterday.    Heart: RRR Chest: clear bil.   Abdomen: soft, NT, ND.  Active Bs  Extremities: no CCE Neuro/Psych:  Pleasant, relaxed.   Intake/Output from previous day: 03/26 0701 - 03/27 0700 In: 416 [P.O.:840; I.V.:3] Out: 1850 [Urine:1850]  Intake/Output this shift:    Lab Results:  Recent Labs  04/03/13 0613 04/03/13 1830 04/04/13 0500  WBC 7.0 6.9 5.9  HGB 8.7* 7.9* 7.4*  HCT 25.7* 23.2* 21.3*  PLT 85* 70* 63*   BMET  Recent Labs  04/02/13 1920 04/03/13 0613 04/04/13 0500  NA 139 140 142  K 4.0 4.2 3.9  CL 99 104 105  CO2 22 22 24   GLUCOSE 168* 155* 76  BUN 28* 33* 27*  CREATININE 1.16 1.43* 1.10  CALCIUM 8.6 7.4* 8.3*   LFT  Recent Labs  04/02/13 1920 04/03/13 0613  PROT 6.8 5.7*  ALBUMIN 2.7* 2.2*  AST 29 18  ALT 26 20  ALKPHOS 98 73  BILITOT 0.6 0.4   PT/INR  Recent Labs  04/02/13 1920 04/04/13 0500  LABPROT 15.6* 13.7  INR 1.27 1.07    Studies/Results: Ct Abdomen Pelvis W Contrast 04/02/2013  FINDINGS: Irregular pleural thickening is partially visualized in the right lung base, similar to the prior PET-CT. Loculated fluid in the right major fissure has increased, incompletely visualized. Mild dependent atelectasis is present in the left lower lobe. Three-vessel  coronary artery calcifications are present.  Calcification in the inferior right hepatic lobe is unchanged. The gallbladder is surgically absent. 2.4 cm soft tissue lesion adjacent to the upper pole of the right kidney is unchanged. 2.0 cm left upper pole renal cyst anteriorly is unchanged. The left kidney and adrenal glands are unremarkable. Small calcification in the pancreatic head is unchanged.  There is very mild mesenteric stranding adjacent to the distal descending colon in the left lower quadrant. Air-fluid levels are present in the colon, which is nondilated. No gross bowel wall thickening is identified. The appendix is identified in the right lower quadrant and is unremarkable. The small bowel is nondilated.  A Foley catheter is present in the bladder. No free fluid or enlarged lymph nodes are identified. Advanced atherosclerotic calcification is noted of the abdominal aorta and iliac arteries. Multiple healing, nondisplaced left-sided rib fractures are partially visualized as described on recent PET-CT. Mild T12 and moderate L1 compression fractures are unchanged.  IMPRESSION: 1. Fluid-filled colon with very mild inflammatory stranding around the distal descending colon. No definite bowel wall thickening is seen, however mild colitis is not excluded. 2. Irregular pleural thickening in the right lung base, consistent with known midtibial EOMI. Fluid in the right major fissure has increased from the prior PET-CT. 3. Unchanged soft  tissue lesion adjacent to the upper pole of the right kidney.   Electronically Signed   By: Logan Bores   On: 04/02/2013 21:54   Scheduled Meds: . arformoterol  15 mcg Nebulization BID  . budesonide (PULMICORT) nebulizer solution  0.5 mg Nebulization BID  . calcium carbonate  2 tablet Oral BID WC  . cholecalciferol  1,000 Units Oral Daily  . folic acid  1 mg Oral Daily  . levofloxacin (LEVAQUIN) IV  750 mg Intravenous Q24H  . levothyroxine  150 mcg Oral QAC breakfast  .  metoprolol tartrate  12.5 mg Oral BID  . polyethylene glycol  17 g Oral Daily  . predniSONE  40 mg Oral Q breakfast  . Rivaroxaban  20 mg Oral Q supper  . senna  2 tablet Oral QHS  . sertraline  100 mg Oral Daily  . sodium chloride  10-40 mL Intracatheter Q12H  . tiotropium  18 mcg Inhalation Daily   Continuous Infusions:  PRN Meds:.acetaminophen, acetaminophen, albuterol, LORazepam, ondansetron (ZOFRAN) IV, ondansetron, sodium chloride, traMADol   ASSESMENT:   * Hematochezia. Rule out rectal abrasion after self disimpaction, rule out stercoral ulcer, rule out ischemic colitis (doubt given the constipation and lack of significant or new abdominal pain)  * Anemia. Just transfused last week with 2 PRBCs for anemia. Now with recurrent ABL anemia. No transfusions this admission.  * Chronic constipation. Long standing. Miralax added 3/26. Pt declined nightly Senokot. Has had at least 2 large BMs, the more recent was brown.  * Syncopal event while having BM, felt to be vaso-vagal. CT head with small vessel disease, nothing acute.  * Hx DVR, chronic Xarelto currently on hold  * Thrombocytopenia. Chronic dates to 2013. Initially intermittent, more persistent since starting chemo.  *  Coagulopathy, mild increase PT is resolved.  * Mesothelioma. Undergoing chemo starting mid January 2015. * COPD, acute exacerbation per CCM: On Levaquin  * Hyperglycemia in setting of Prednisone burst.  *  Renal insufficiency.     PLAN   *  Would go ahead and transfuse 1 to 2 units of PRBCs.  *  No plans at present to perform flex sig given that bleeding seems to be resolving.  *  In future will need to decide when to restart Xarelto  .     Azucena Freed  04/04/2013, 9:02 AM Pager: [phone]  [facility] Attending  I have also seen and assessed the patient and agree with the above note. Bleeding slowing/stopping. Considering his situation and clinical scenario - seems ok to treat conservatively and not  work-up. Should be able to resume Xarelto soon - within a few days if needed If more bleeding then will need sigmoidoscopy vs. Colonoscopy Ischemic colitis seems wuite possible given the scenario of worsened constipation, bleeding and stranding around descending colon on CT  Gatha Mayer, MD, [facility] [phone] (pager) 04/04/2013 4:52 PM

## 2013-04-04 NOTE — Progress Notes (Signed)
PULMONARY / CRITICAL CARE MEDICINE  Name: Steve [name] MRN: [id number] DOB: 1933-10-31    ADMISSION DATE:  04/02/2013 CONSULTATION DATE:  04/02/2013   REFERRING MD :  EDP PRIMARY SERVICE: TRH  CHIEF COMPLAINT:  Concern for sepsis  BRIEF PATIENT DESCRIPTION: 80 yo with COPD and Mesothelioma on chemotherapy who presented to [facility] ED on 3/25 with hypotension and encephalopathy after passing out while trying to use the restroom .     SIGNIFICANT EVENTS / STUDIES:  3/25  Admited after syncopal episode while trying to use restroom  3/25  Head CT >>> nad 3/25  Abdomen CT >>> Fluid filled colon with very mild inflammatory stranding in distal descending colon, unchanged soft tissue lesion adjacent to upper pole of the R kidney, irregular pleural thickening in R base c/w known mesothelioma  3/27  Venous Doppler >>> PRELIMINARY >>>  No acute DVT, chronic DVT fem / popl vein  CULTURES: 3/25  Blood >>>  ANTIBIOTICS: Vanco (empiric) 3/25 >>> 3/26 Zosyn (empiric) 3/25 >>> 3/26 Levaquin 3/26 >>>  INTERVAL HISTORY:  Respiratory status improved.  VITAL SIGNS: Temp:  [97.6 F (36.4 C)-98 F (36.7 C)] 97.6 F (36.4 C) (03/27 0428) Pulse Rate:  [63-68] 63 (03/27 0428) Resp:  [18-20] 18 (03/27 0428) BP: (141-153)/(60-71) 141/71 mmHg (03/27 0428) SpO2:  [96 %-97 %] 97 % (03/27 0428)  INTAKE / OUTPUT: Intake/Output     03/26 0701 - 03/27 0700 03/27 0701 - 03/28 0700   P.O. 840 240   I.V. (mL/kg) 3 (0)    Total Intake(mL/kg) 843 (9.4) 240 (2.7)   Urine (mL/kg/hr) 1850 (0.9)    Total Output 1850     Net -1007 +240        Stool Occurrence 1 x      PHYSICAL EXAMINATION: General:  Resting comfortably Neuro:  Awake, alert, cooperative HEENT:  No JVD Cardiovascular:  Regular, no murmurs Lungs:  Bilateral diminished air entry, no wheezing Abdomen:  Soft, non tender Musculoskeletal:  Trace edema Skin:  No rash  LABS:  CBC  Recent Labs Lab 04/03/13 0613 04/03/13 1830 04/04/13 0500   WBC 7.0 6.9 5.9  HGB 8.7* 7.9* 7.4*  HCT 25.7* 23.2* 21.3*  PLT 85* 70* 63*   Coag's  Recent Labs Lab 04/02/13 1920 04/04/13 0500  APTT  --  33  INR 1.27 1.07   BMET  Recent Labs Lab 04/02/13 1920 04/03/13 0613 04/04/13 0500  NA 139 140 142  K 4.0 4.2 3.9  CL 99 104 105  CO2 22 22 24   BUN 28* 33* 27*  CREATININE 1.16 1.43* 1.10  GLUCOSE 168* 155* 76   Electrolytes  Recent Labs Lab 04/02/13 1920 04/03/13 0613 04/04/13 0500  CALCIUM 8.6 7.4* 8.3*   Sepsis Markers  Recent Labs Lab 04/02/13 1940 04/02/13 2310 04/03/13 0613  LATICACIDVEN 3.39* 3.2* 2.1   ABG  Recent Labs Lab 04/02/13 2217  PHART 7.299*  PCO2ART 44.7  PO2ART 84.0   Liver Enzymes  Recent Labs Lab 04/02/13 1920 04/03/13 0613  AST 29 18  ALT 26 20  ALKPHOS 98 73  BILITOT 0.6 0.4  ALBUMIN 2.7* 2.2*   Cardiac Enzymes  Recent Labs Lab 04/02/13 1920 04/03/13 0613 04/03/13 1120  TROPONINI  --  <0.30 <0.30  PROBNP 1477.0*  --   --    Glucose No results found for this basename: GLUCAP,  in the last 168 hours  IMAGING:  Ct Head Wo Contrast  04/02/2013   CLINICAL DATA:  Hypertension.  Constipation.  Extremity weakness.  EXAM: CT HEAD WITHOUT CONTRAST  TECHNIQUE: Contiguous axial images were obtained from the base of the skull through the vertex without intravenous contrast.  COMPARISON:  Head MRI 05/18/2012  FINDINGS: There is mild cerebral atrophy, not significantly changed. Patchy hyperintensities in the cerebral white matter bilaterally are nonspecific but compatible with mild chronic small vessel ischemic disease. There is no evidence of acute cortical infarct, mass, midline shift, intracranial hemorrhage, or extra-axial fluid collection. Orbits are unremarkable. The mastoid air cells are clear. The right maxillary sinus is small and demonstrates moderate mucosal thickening, new from prior MRI.  IMPRESSION: No evidence of acute intracranial abnormality. Mild chronic small vessel  ischemic disease.   Electronically Signed   By: Logan Bores   On: 04/02/2013 21:36   Ct Abdomen Pelvis W Contrast  04/02/2013   CLINICAL DATA:  Hypertension. Constipation. Extremity weakness. Mesothelioma.  EXAM: CT ABDOMEN AND PELVIS WITH CONTRAST  TECHNIQUE: Multidetector CT imaging of the abdomen and pelvis was performed using the standard protocol following bolus administration of intravenous contrast.  CONTRAST:  151mL OMNIPAQUE IOHEXOL 300 MG/ML  SOLN  COMPARISON:  PET-CT 03/03/2013  FINDINGS: Irregular pleural thickening is partially visualized in the right lung base, similar to the prior PET-CT. Loculated fluid in the right major fissure has increased, incompletely visualized. Mild dependent atelectasis is present in the left lower lobe. Three-vessel coronary artery calcifications are present.  Calcification in the inferior right hepatic lobe is unchanged. The gallbladder is surgically absent. 2.4 cm soft tissue lesion adjacent to the upper pole of the right kidney is unchanged. 2.0 cm left upper pole renal cyst anteriorly is unchanged. The left kidney and adrenal glands are unremarkable. Small calcification in the pancreatic head is unchanged.  There is very mild mesenteric stranding adjacent to the distal descending colon in the left lower quadrant. Air-fluid levels are present in the colon, which is nondilated. No gross bowel wall thickening is identified. The appendix is identified in the right lower quadrant and is unremarkable. The small bowel is nondilated.  A Foley catheter is present in the bladder. No free fluid or enlarged lymph nodes are identified. Advanced atherosclerotic calcification is noted of the abdominal aorta and iliac arteries. Multiple healing, nondisplaced left-sided rib fractures are partially visualized as described on recent PET-CT. Mild T12 and moderate L1 compression fractures are unchanged.  IMPRESSION: 1. Fluid-filled colon with very mild inflammatory stranding around the  distal descending colon. No definite bowel wall thickening is seen, however mild colitis is not excluded. 2. Irregular pleural thickening in the right lung base, consistent with known midtibial EOMI. Fluid in the right major fissure has increased from the prior PET-CT. 3. Unchanged soft tissue lesion adjacent to the upper pole of the right kidney.   Electronically Signed   By: Logan Bores   On: 04/02/2013 21:54   Dg Chest Port 1 View  04/02/2013   CLINICAL DATA:  Hypotension. Constipation and extremity weakness. History of mesothelioma per prior radiology exams.  EXAM: PORTABLE CHEST - 1 VIEW  COMPARISON:  NM PET IMAGE RESTAG (PS) SKULL BASE TO THIGH dated 03/03/2013; CT ANGIO CHEST W/CM &/OR WO/CM dated 02/10/2013; DG RIBS BILATERAL W/CHEST dated 02/06/2013  FINDINGS: 1945 hr. Right IJ power port tip appears unchanged at the SVC right atrial level. There is stable volume loss in the right hemithorax with extensive pleural nodular thickening. Underlying interstitial prominence in both lungs has mildly increased. There is no confluent airspace opacity or significant pleural  effusion. Surgical clips are noted at the thoracic inlet.  IMPRESSION: Similar overall appearance of the chest compared with available prior studies. There is mildly increased interstitial prominence in both lungs which could be related to lower lung volumes or mild edema. Nodular pleural thickening on the right remains corresponding with known mesothelioma.   Electronically Signed   By: Camie Patience M.D.   On: 04/02/2013 20:12   ASSESSMENT / PLAN:  COPD with acute exacerbation  Supplemental oxygen  Albuterol / Garlon Hatchet / Spiriva / Pulmicort  Prednison  Levaquin  OSA  Defer BiPAP as not compliant preadmission  Mesothelioma   On chemotherapy  Chronic DVT Lupus anticoagulant positive Heterozygote factor V Leiden mutation  Anticoagulation vs IVC filter in presence of thrombocytopenia / GI hemorrhage  I have personally obtained  history, examined patient, evaluated and interpreted laboratory and imaging results, reviewed medical records, formulated assessment / plan and placed orders.  Doree Fudge, MD Pulmonary and [place] Pager: [phone]  04/04/2013, 2:28 PM

## 2013-04-04 NOTE — Consult Note (Signed)
Steve [name]  [phone]    HOSPITAL PROGRESS NOTE  Principal  Diagnosis:   . 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  2. Heterozygote factor V Leiden mutation.  3. Lupus anticoagulant positive.  4. Deep venous thrombosis of the right leg.  5. 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  6. Papillary carcinoma of the thyroid.   Current Therapy:   Status post 4 cycles of carboplatin/Alimta  Xarelto 20 mg by mouth daily  He was last seen by Dr. Marin Olp at the [facility]  on 03/27/13 for D1C4 chemo.he also received 2 units of blood on 3/20 for a Hb 8.2 (was 14 on February of 2015 and 11.2 after transfusion)   for symptomatic anemia.  HPI: Mr. Costlow was admitted on 3/25 with hypotension, elevated lactic acid levels, dehydration and hypoxia following a syncopal episode after valsalva during bowel movement. CT of the head on 3/25 was negative for intracranial abnormalities. CT of the abdomen and pelvis on 3/25 showed fluid filled colon with very mild inflammatory stranding in distal descending colon, unchanged soft tissue lesion adjacent to upper pole of the right kidney and  irregular pleural thickening in right base consistent with known mesothelioma . Cultures were obtained and he was placed on Vanc and Zosyn-> switched to Levaquin, Decadron then switched to oral prednisone was given.  GI was consulted on 04/03/13 for hematochezia (with FOBT positive) on the day of admission. Per chart notes, he was found to have significant amount on of fresh blood on the sheets, with a later bowel movement with mixed blood and clots. No colonoscopy was recommended due to advanced lung disease which places him at high sedation risk. Instead, Miralax was added daily, and prn transfusions were suggested. Xarelto is not on hold to date. Hematochezia improved, with one  small red stool reported around midnight without recurrence. Symptoms improving. Today  however, his  H/H 7.4/21.3, plts 63k (from 70 on 3/26.)  We have been asked to see the patient with recommendations.  Marland Kitchen   MEDICATIONS: Scheduled Meds: . arformoterol  15 mcg Nebulization BID  . budesonide (PULMICORT) nebulizer solution  0.5 mg Nebulization BID  . calcium carbonate  2 tablet Oral BID WC  . cholecalciferol  1,000 Units Oral Daily  . folic acid  1 mg Oral Daily  . levofloxacin (LEVAQUIN) IV  750 mg Intravenous Q24H  . levothyroxine  150 mcg Oral QAC breakfast  . metoprolol tartrate  12.5 mg Oral BID  . polyethylene glycol  17 g Oral Daily  . predniSONE  40 mg Oral Q breakfast  . Rivaroxaban  20 mg Oral Q supper  . senna  2 tablet Oral QHS  . sertraline  100 mg Oral Daily  . sodium chloride  10-40 mL Intracatheter Q12H  . tiotropium  18 mcg Inhalation Daily   Continuous Infusions:  PRN Meds:.acetaminophen, acetaminophen, albuterol, LORazepam, ondansetron (ZOFRAN) IV, ondansetron, sodium chloride, traMADol   ALLERGIES:   Allergies  Allergen Reactions  . Lyrica [Pregabalin] Hives and Itching     PHYSICAL EXAMINATION:   Filed Vitals:   04/04/13 0428  BP: 141/71  Pulse: 63  Temp: 97.6 F (36.4 C)  Resp: 18     Filed Weights   04/02/13 1945 04/03/13 0320  Weight: 194 lb 0.1 oz (88 kg) 196 lb 11.2 oz (89.41 kg)    80 year old white male in no acute distress, alert and oriented x3  General well-developed and well-nourished  HEENT: Normocephalic, atraumatic,  PERRLA. Oral cavity without thrush or lesions. Neck supple. no thyromegaly, no cervical or supraclavicular adenopathy  Lungs trace bibasilar rales, no wheezing or rhonchi Cardiac: regular rate and rhythm,no murmur , rubs or gallops Abdomen soft nontender , bowel sounds x4. No hepatosplenomegaly Extremities mild  Clubbing no  cyanosis or edema. No  petechial rash Neuro: non focal  LABORATORY/RADIOLOGY DATA:   Recent Labs Lab 04/02/13 1920 04/03/13 0613 04/03/13 1830 04/04/13 0500  WBC 5.0  7.0 6.9 5.9  HGB 11.1* 8.7* 7.9* 7.4*  HCT 32.5* 25.7* 23.2* 21.3*  PLT 137* 85* 70* 63*  MCV 95.3 95.5 94.3 93.8  MCH 32.6 32.3 32.1 32.6  MCHC 34.2 33.9 34.1 34.7  RDW 16.4* 16.8* 16.6* 16.5*  LYMPHSABS 1.1 0.2*  --   --   MONOABS 0.0* 0.1  --   --   EOSABS 0.0 0.0  --   --   BASOSABS 0.0 0.0  --   --     CMP    Recent Labs Lab 04/02/13 1920 04/03/13 0613 04/04/13 0500  NA 139 140 142  K 4.0 4.2 3.9  CL 99 104 105  CO2 22 22 24   GLUCOSE 168* 155* 76  BUN 28* 33* 27*  CREATININE 1.16 1.43* 1.10  CALCIUM 8.6 7.4* 8.3*  AST 29 18  --   ALT 26 20  --   ALKPHOS 98 73  --   BILITOT 0.6 0.4  --         Component Value Date/Time   BILITOT 0.4 04/03/2013 0613   BILITOT 0.50 03/27/2013 0850     Recent Labs  04/03/13 0613  TSH 1.636        Component Value Date/Time   ESRSEDRATE 83* 08/01/2011 0937     Recent Labs Lab 04/02/13 1920 04/04/13 0500  INR 1.27 1.07      Urinalysis    Component Value Date/Time   COLORURINE YELLOW 04/02/2013 2022   APPEARANCEUR CLEAR 04/02/2013 2022   LABSPEC 1.011 04/02/2013 2022   PHURINE 7.5 04/02/2013 2022   GLUCOSEU 100* 04/02/2013 2022   HGBUR SMALL* 04/02/2013 2022   BILIRUBINUR NEGATIVE 04/02/2013 2022   KETONESUR NEGATIVE 04/02/2013 2022   PROTEINUR 100* 04/02/2013 2022   UROBILINOGEN 1.0 04/02/2013 2022   NITRITE NEGATIVE 04/02/2013 2022   LEUKOCYTESUR NEGATIVE 04/02/2013 2022    Liver Function Tests:  Recent Labs Lab 04/02/13 1920 04/03/13 0613  AST 29 18  ALT 26 20  ALKPHOS 98 73  BILITOT 0.6 0.4  PROT 6.8 5.7*  ALBUMIN 2.7* 2.2*    Recent Labs Lab 04/02/13 1920  LIPASE 27    Cardiac Enzymes:  Recent Labs Lab 04/03/13 0613 04/03/13 1120  CKTOTAL  --  108  TROPONINI <0.30 <0.30    Thyroid function studies  Recent Labs  04/03/13 0613  TSH 1.636    Radiology Studies:  Ct Head Wo Contrast  04/02/2013   CLINICAL DATA:  Hypertension.  Constipation.  Extremity weakness.  EXAM: CT HEAD  WITHOUT CONTRAST  TECHNIQUE: Contiguous axial images were obtained from the base of the skull through the vertex without intravenous contrast.  COMPARISON:  Head MRI 05/18/2012  FINDINGS: There is mild cerebral atrophy, not significantly changed. Patchy hyperintensities in the cerebral white matter bilaterally are nonspecific but compatible with mild chronic small vessel ischemic disease. There is no evidence of acute cortical infarct, mass, midline shift, intracranial hemorrhage, or extra-axial fluid collection. Orbits are unremarkable. The mastoid air cells are clear. The right maxillary sinus is  small and demonstrates moderate mucosal thickening, new from prior MRI.  IMPRESSION: No evidence of acute intracranial abnormality. Mild chronic small vessel ischemic disease.   Electronically Signed   By: Logan Bores   On: 04/02/2013 21:36   Ct Abdomen Pelvis W Contrast  04/02/2013   CLINICAL DATA:  Hypertension. Constipation. Extremity weakness. Mesothelioma.  EXAM: CT ABDOMEN AND PELVIS WITH CONTRAST  TECHNIQUE: Multidetector CT imaging of the abdomen and pelvis was performed using the standard protocol following bolus administration of intravenous contrast.  CONTRAST:  137mL OMNIPAQUE IOHEXOL 300 MG/ML  SOLN  COMPARISON:  PET-CT 03/03/2013  FINDINGS: Irregular pleural thickening is partially visualized in the right lung base, similar to the prior PET-CT. Loculated fluid in the right major fissure has increased, incompletely visualized. Mild dependent atelectasis is present in the left lower lobe. Three-vessel coronary artery calcifications are present.  Calcification in the inferior right hepatic lobe is unchanged. The gallbladder is surgically absent. 2.4 cm soft tissue lesion adjacent to the upper pole of the right kidney is unchanged. 2.0 cm left upper pole renal cyst anteriorly is unchanged. The left kidney and adrenal glands are unremarkable. Small calcification in the pancreatic head is unchanged.  There is  very mild mesenteric stranding adjacent to the distal descending colon in the left lower quadrant. Air-fluid levels are present in the colon, which is nondilated. No gross bowel wall thickening is identified. The appendix is identified in the right lower quadrant and is unremarkable. The small bowel is nondilated.  A Foley catheter is present in the bladder. No free fluid or enlarged lymph nodes are identified. Advanced atherosclerotic calcification is noted of the abdominal aorta and iliac arteries. Multiple healing, nondisplaced left-sided rib fractures are partially visualized as described on recent PET-CT. Mild T12 and moderate L1 compression fractures are unchanged.  IMPRESSION: 1. Fluid-filled colon with very mild inflammatory stranding around the distal descending colon. No definite bowel wall thickening is seen, however mild colitis is not excluded. 2. Irregular pleural thickening in the right lung base, consistent with known midtibial EOMI. Fluid in the right major fissure has increased from the prior PET-CT. 3. Unchanged soft tissue lesion adjacent to the upper pole of the right kidney.   Electronically Signed   By: Logan Bores   On: 04/02/2013 21:54   Dg Chest Port 1 View  04/02/2013   CLINICAL DATA:  Hypotension. Constipation and extremity weakness. History of mesothelioma per prior radiology exams.  EXAM: PORTABLE CHEST - 1 VIEW  COMPARISON:  NM PET IMAGE RESTAG (PS) SKULL BASE TO THIGH dated 03/03/2013; CT ANGIO CHEST W/CM &/OR WO/CM dated 02/10/2013; DG RIBS BILATERAL W/CHEST dated 02/06/2013  FINDINGS: 1945 hr. Right IJ power port tip appears unchanged at the SVC right atrial level. There is stable volume loss in the right hemithorax with extensive pleural nodular thickening. Underlying interstitial prominence in both lungs has mildly increased. There is no confluent airspace opacity or significant pleural effusion. Surgical clips are noted at the thoracic inlet.  IMPRESSION: Similar overall  appearance of the chest compared with available prior studies. There is mildly increased interstitial prominence in both lungs which could be related to lower lung volumes or mild edema. Nodular pleural thickening on the right remains corresponding with known mesothelioma.   Electronically Signed   By: Camie Patience M.D.   On: 04/02/2013 20:12       ASSESSMENT AND PLAN:   1. Mesothelioma,Status post 4 cycles of carboplatin/Alimta on 3/19 (D1C4). Due for D1C5 on 4/9  2. History of DVT on Xarelto. Currently on hold since 3/26. Dr. Marin Olp to see the patient today with recommendations.   3. Anemia, s/p 2 units on 03/28/13, multifactorial, in the setting of chronic disease, hematochezia and recent chemo  4. Thrombocytopenia, in the setting hematochezia and recent chemo. Monitor closely. No need for transfusion at this time.  5. COPD exacerbation, as per admitting team.   5. Full Code  WERTMAN,SARA E, PA-C 04/04/2013, 8:43 AM  ADDENDUM:  I saw and examined the patient.  My main concern is this anemia. He seems to be bleeding. He has been seen by gastroenterology. I do not feel leg endoscopy is necessary.  He is now off Xarelto. He had a blood clot in the right leg over a year ago.  He had a recent Doppler done which was done but I do not see any results yet.  He is at significant risk for further thromboembolic disease because of his factor V Leiden mutation and his positive lupus anticoagulant and his mesothelioma.  As such, I feel that he needs to have an IVC filter placed to prevent any lower extremity thromboembolic disease to get to his heart. I think that if he were to have a pulmonary embolism, this would be very hard for him to overcome.  I talked to him about an IVC filter placement. He agrees. He's been off Xarelto for couple days I don't think Lahey should be a problem although he does have  thrombocytopenia from his chemotherapy.  I would transfuse him 2 more units because of  his underlying pulmonary disease and the fact that his blood counts will likely continue to drop.  He is scheduled for a PET scan on April 9. We will continue this.  We will continue following him along.  I thank the hospitalist for help in and giving him fantastic care.  Pete E.  Psalm 27:1

## 2013-04-04 NOTE — Progress Notes (Signed)
VASCULAR LAB PRELIMINARY  PRELIMINARY  PRELIMINARY  PRELIMINARY  Bilateral lower extremity venous duplex  completed.    Preliminary report:  Bilateral:  No evidence of acute DVT, superficial thrombosis, or Baker's Cyst.  Right:  Chronic DVT noted in the femoral vein and popliteal vein.      Meda Dudzinski, RVT 04/04/2013, 2:10 PM

## 2013-04-05 ENCOUNTER — Inpatient Hospital Stay (HOSPITAL_COMMUNITY): Payer: Medicare Other

## 2013-04-05 ENCOUNTER — Encounter (HOSPITAL_COMMUNITY): Payer: Self-pay | Admitting: Radiology

## 2013-04-05 LAB — CBC
HEMATOCRIT: 31.6 % — AB (ref 39.0–52.0)
Hemoglobin: 11.1 g/dL — ABNORMAL LOW (ref 13.0–17.0)
MCH: 31.3 pg (ref 26.0–34.0)
MCHC: 35.1 g/dL (ref 30.0–36.0)
MCV: 89 fL (ref 78.0–100.0)
Platelets: 43 10*3/uL — ABNORMAL LOW (ref 150–400)
RBC: 3.55 MIL/uL — AB (ref 4.22–5.81)
RDW: 17.7 % — ABNORMAL HIGH (ref 11.5–15.5)
WBC: 4.8 10*3/uL (ref 4.0–10.5)

## 2013-04-05 LAB — BASIC METABOLIC PANEL
BUN: 22 mg/dL (ref 6–23)
CALCIUM: 8.8 mg/dL (ref 8.4–10.5)
CO2: 29 meq/L (ref 19–32)
Chloride: 97 mEq/L (ref 96–112)
Creatinine, Ser: 1.02 mg/dL (ref 0.50–1.35)
GFR calc Af Amer: 80 mL/min — ABNORMAL LOW (ref >90)
GFR calc non Af Amer: 69 mL/min — ABNORMAL LOW (ref >90)
GLUCOSE: 97 mg/dL (ref 70–99)
Potassium: 3.6 mEq/L — ABNORMAL LOW (ref 3.7–5.3)
SODIUM: 139 meq/L (ref 137–147)

## 2013-04-05 MED ORDER — MIDAZOLAM HCL 2 MG/2ML IJ SOLN
INTRAMUSCULAR | Status: AC | PRN
  Administered 2013-04-05: 1 mg via INTRAVENOUS

## 2013-04-05 MED ORDER — LISINOPRIL 10 MG PO TABS
10.0000 mg | ORAL_TABLET | Freq: Every day | ORAL | Status: DC
  Administered 2013-04-05 – 2013-04-07 (×3): 10 mg via ORAL
  Filled 2013-04-05 (×3): qty 1

## 2013-04-05 MED ORDER — IOHEXOL 300 MG/ML  SOLN
100.0000 mL | Freq: Once | INTRAMUSCULAR | Status: AC | PRN
  Administered 2013-04-05: 34 mL via INTRAVENOUS

## 2013-04-05 MED ORDER — FENTANYL CITRATE 0.05 MG/ML IJ SOLN
INTRAMUSCULAR | Status: AC | PRN
  Administered 2013-04-05: 50 ug via INTRAVENOUS

## 2013-04-05 MED ORDER — FENTANYL CITRATE 0.05 MG/ML IJ SOLN
INTRAMUSCULAR | Status: AC
  Filled 2013-04-05: qty 2

## 2013-04-05 MED ORDER — LEVOFLOXACIN 750 MG PO TABS
750.0000 mg | ORAL_TABLET | Freq: Every day | ORAL | Status: DC
  Administered 2013-04-06 – 2013-04-07 (×2): 750 mg via ORAL
  Filled 2013-04-05 (×2): qty 1

## 2013-04-05 MED ORDER — IOHEXOL 300 MG/ML  SOLN: 50.0000 mL | Freq: Once | INTRAMUSCULAR | Status: AC | PRN

## 2013-04-05 MED ORDER — MIDAZOLAM HCL 2 MG/2ML IJ SOLN
INTRAMUSCULAR | Status: AC
  Filled 2013-04-05: qty 2

## 2013-04-05 NOTE — Progress Notes (Signed)
PROGRESS NOTE  Steve [name] GXQ:[id number] DOB: 05-Dec-1933 DOA: 04/02/2013 PCP: Penni Homans, MD  Assessment/Plan: Syncope - patient currently asymptomatic, likely vasovagal with a possible component of lower GI bleed. Lower GI bleed - no more bloody bowel movements. CBC not done this morning, repeat STAT History of DVT/lupus anticoagulant positive - discussed case with Dr. Marin Olp today from oncology. Has had history of right lower extremity DVT and has been on anticoagulations for a few months, repeat study with chronic DVT. IVC filter placed today per IR. Appreciate hematology input, probably needs to be off of A/C for 1-2 weeks. He had a CT angiogram of the chest in February of 2015 which was negative for pulmonary embolism.  HTN - blood pressure start to trend up. Restart Lisinopril. AAA - no abdominal pain  Anemia - likely multifactorial due to ongoing chemotherapy, cancer, GI bleed.  - s/p 3U pRBC. Repeat CBC pending.  AECOPD - Levofloxacin, steroids and breathing treatments. Improving, patient without any wheezing.  Mesothelioma of the right lung, stage III - currently undergoing chemotherapy. Oncology following.  OSA - pulmonology seeing patient while hospitalized.   Diet: Heart healthy Fluids: None DVT Prophylaxis: SCDs  Code Status: Full code Family Communication: Discussed with patient  Disposition Plan: Inpatient  Consultants:  Gastroenterology  Pulmonology  Oncology  Procedures:  None   Antibiotics Levofloxacin 3/26>>  HPI/Subjective: Continues to feel well.  Objective: Filed Vitals:   04/05/13 0415 04/05/13 0515 04/05/13 0545 04/05/13 0700  BP: 179/60 184/68 193/68 157/63  Pulse: 54 53 52   Temp: 97.6 F (36.4 C) 97.9 F (36.6 C) 97.8 F (36.6 C)   TempSrc: Oral Oral Oral   Resp: 16 18 18    Height:      Weight:   86.637 kg (191 lb)   SpO2: 96% 97% 98%     Intake/Output Summary (Last 24 hours) at 04/05/13 0930 Last data filed at 04/05/13  0700  Gross per 24 hour  Intake 969.16 ml  Output   7620 ml  Net -6650.84 ml   Filed Weights   04/02/13 1945 04/03/13 0320 04/05/13 0545  Weight: 88 kg (194 lb 0.1 oz) 89.223 kg (196 lb 11.2 oz) 86.637 kg (191 lb)   Exam:  General:  NAD  Cardiovascular: regular rate and rhythm, without MRG  Respiratory: No wheezing, overall decreased breath sounds throughout.  Abdomen: soft, not tender to palpation, positive bowel sounds  MSK: no peripheral edema  Neuro: Grossly nonfocal  Data Reviewed: Basic Metabolic Panel:  Recent Labs Lab 04/02/13 1920 04/03/13 0613 04/04/13 0500  NA 139 140 142  K 4.0 4.2 3.9  CL 99 104 105  CO2 22 22 24   GLUCOSE 168* 155* 76  BUN 28* 33* 27*  CREATININE 1.16 1.43* 1.10  CALCIUM 8.6 7.4* 8.3*   Liver Function Tests:  Recent Labs Lab 04/02/13 1920 04/03/13 0613  AST 29 18  ALT 26 20  ALKPHOS 98 73  BILITOT 0.6 0.4  PROT 6.8 5.7*  ALBUMIN 2.7* 2.2*    Recent Labs Lab 04/02/13 1920  LIPASE 27   CBC:  Recent Labs Lab 04/02/13 1920 04/03/13 0613 04/03/13 1830 04/04/13 0500  WBC 5.0 7.0 6.9 5.9  NEUTROABS 3.9 6.7  --   --   HGB 11.1* 8.7* 7.9* 7.4*  HCT 32.5* 25.7* 23.2* 21.3*  MCV 95.3 95.5 94.3 93.8  PLT 137* 85* 70* 63*   Cardiac Enzymes:  Recent Labs Lab 04/03/13 0613 04/03/13 1120  CKTOTAL  --  44  TROPONINI <0.30 <0.30   BNP (last 3 results)  Recent Labs  02/10/13 1808 04/02/13 1920  PROBNP 856.8* 1477.0*   Recent Results (from the past 240 hour(s))  CULTURE, BLOOD (ROUTINE X 2)     Status: None   Collection Time    04/02/13  7:20 PM      Result Value Ref Range Status   Specimen Description BLOOD RIGHT ARM   Final   Special Requests BOTTLES DRAWN AEROBIC AND ANAEROBIC 5CC   Final   Culture  Setup Time     Final   Value: 04/03/2013 00:31     Performed at [facility]   Culture     Final   Value:        BLOOD CULTURE RECEIVED NO GROWTH TO DATE CULTURE WILL BE HELD FOR 5 DAYS BEFORE  ISSUING A FINAL NEGATIVE REPORT     Performed at [facility]   Report Status PENDING   Incomplete  CULTURE, BLOOD (ROUTINE X 2)     Status: None   Collection Time    04/02/13  8:04 PM      Result Value Ref Range Status   Specimen Description BLOOD HAND RIGHT   Final   Special Requests BOTTLES DRAWN AEROBIC ONLY 6.5CC   Final   Culture  Setup Time     Final   Value: 04/03/2013 00:33     Performed at [facility]   Culture     Final   Value:        BLOOD CULTURE RECEIVED NO GROWTH TO DATE CULTURE WILL BE HELD FOR 5 DAYS BEFORE ISSUING A FINAL NEGATIVE REPORT     Performed at [facility]   Report Status PENDING   Incomplete   Studies: No results found.  Scheduled Meds: . arformoterol  15 mcg Nebulization BID  . budesonide (PULMICORT) nebulizer solution  0.5 mg Nebulization BID  . calcium carbonate  2 tablet Oral BID WC  . cholecalciferol  1,000 Units Oral Daily  . folic acid  1 mg Oral Daily  . levofloxacin (LEVAQUIN) IV  750 mg Intravenous Q24H  . levothyroxine  150 mcg Oral QAC breakfast  . metoprolol tartrate  12.5 mg Oral BID  . nicotine  21 mg Transdermal Daily  . polyethylene glycol  17 g Oral Daily  . predniSONE  40 mg Oral Q breakfast  . senna  2 tablet Oral QHS  . sertraline  100 mg Oral Daily  . sodium chloride  10-40 mL Intracatheter Q12H  . tiotropium  18 mcg Inhalation Daily   Continuous Infusions:  Principal Problem:   Hypotension Active Problems:   Hypertension   Atrial fibrillation   Sleep apnea   Mesothelioma (pleural)   Hypokalemia   COPD exacerbation   Syncope   Hematochezia   Acute posthemorrhagic anemia  Time spent: 25  This note has been created with Surveyor, quantity. Any transcriptional errors are unintentional.   Marzetta Board, MD [facility] Pager [phone]. If 7 PM - 7 AM, please contact night-coverage at [email or web address], password [facility] 04/05/2013, 9:30 AM  LOS:  3 days

## 2013-04-05 NOTE — Procedures (Signed)
Procedure:  IVC filter placement Access:  Right IJ vein Findings:  Normally patent IVC of normal caliber.  [place] IVC filter placed in infrarenal IVC. No complications.

## 2013-04-05 NOTE — Progress Notes (Signed)
Levofloxacin per Pharmacy:  88 YOM on D#3 Levaquin for COPD exacerbation. He is afebrile, wbc wnl. On 3L O2, respiratory improving. Renal function stable, est. crcl ~ 60 ml/min.  3/25 Bld cx >> ngtd  Vanc x 1 3/25 Levaquin 3/26>>  Plan:  levaquin 750mg  daily, change to PO watch Scr trend  Maryanna Shape, PharmD, BCPS  Clinical Pharmacist  Pager: [phone]

## 2013-04-05 NOTE — Progress Notes (Signed)
    No more bleeding per patient. Still constipated (chronic) For IVC filter today. Wt Readings from Last 3 Encounters:  04/05/13 191 lb (86.637 kg)  03/27/13 194 lb (87.998 kg)  03/24/13 194 lb (87.998 kg)   Blood pressure 157/63, pulse 52, temperature 97.8 F (36.6 C), temperature source Oral, resp. rate 18, height 5\' 8"  (1.727 m), weight 191 lb (86.637 kg), SpO2 92.00%.   Lab Results  Component Value Date   WBC 5.9 04/04/2013   HGB 7.4* 04/04/2013   HCT 21.3* 04/04/2013   MCV 93.8 04/04/2013   PLT 63* 04/04/2013    A/P  1) hematochezia - I think it was ischemic colitis which should resolve on its own 2) constipation - chronic - continue MiraLax  Let us know if recurrent bleeding but no workup at this time given hx/co-morbidities   Gatha Mayer, MD, [facility] Gastroenterology [phone] (pager) 04/05/2013 10:13 AM

## 2013-04-05 NOTE — Consult Note (Signed)
Agree with above assessment.  There is a clinical indication for an IVC filter.  Will proceed with filter placement later today.

## 2013-04-05 NOTE — Consult Note (Signed)
HPI: Steve [name] is an 80 y.o. male with hx of mesothelioma. He also has hx of Factor V Leiden mutation and + Lupus anticoagulant. He has had prior DVT in [place] and has been on Xarelto. He has been admitted with anemia and hematochezia. SInce admission, he has not ahd any more bloody BMs, however he remains anemic. Heme/Onc has consulted and feel it is best to stop the Xarelto. He remains at risk for thromboembolism and therefore IR is requested to place IVC filter. Chart, PMHx, meds reviewed. Pt feels ok this am. Denies abd pain.   Past Medical History:  Past Medical History  Diagnosis Date  . Prostate enlargement   . GERD (gastroesophageal reflux disease)   . AAA (abdominal aortic aneurysm)     stress test 09/14/10 [facility]  . Anxiety   . Recurrent upper respiratory infection (URI)     productive cough- white phlegm- no fever  . Hypertension     chest x ray 12/12 EPIC- repeated 06/06/11, EKG 11/12 EP[facility]  . DVT (deep venous thrombosis) 08/2011  . Pneumonia   . Bruises easily     takes Xarelto  . Depression   . Dysrhythmia     hx of atrial fib post op x 2 days   . Atrial fibrillation   . Mesothelioma   . Arthritis     knee and back  . Sleep apnea      12/12 sleep study,SEVERE per study  Dr Lamonte Sakai- states doesnt wear machine on regular basis- setting BiPAP 9/9  . H/O hiatal hernia   . COPD (chronic obstructive pulmonary disease)     patient uses oxygen 2L/[place] at nite   . Epistaxis 05/13/2012  . Olecranon bursitis of right elbow 06/28/2012    Past Surgical History:  Past Surgical History  Procedure Laterality Date  . Knee surgery  1957    left knee arthotomy  . Abdominal aortic aneurysm repair  11/14/2010    AAA Repair    Dr Donnetta Hutching  . Transurethral resection of prostate  01/25/2011    TURP  . Incisional hernia repair  06/12/2011    Procedure: HERNIA REPAIR INCISIONAL;  Surgeon: Earnstine Regal, MD;  Location: [facility];  Service: General;  Laterality: N/A;  Open Repair Ventral Incisional  Hernia with Mesh  . Hernia repair    . Video assisted thoracoscopy  11/02/2011    Procedure: VIDEO ASSISTED THORACOSCOPY;  Surgeon: Melrose Nakayama, MD;  Location: [place];  Service: Thoracic;  Laterality: Right;  . Pleural biopsy  11/02/2011    Procedure: PLEURAL BIOPSY;  Surgeon: Melrose Nakayama, MD;  Location: [place];  Service: Thoracic;  Laterality: Right;  Parietal and visceral biopsies  . Chest tube insertion  11/02/2011    Procedure: INSERTION PLEURAL DRAINAGE CATHETER;  Surgeon: Melrose Nakayama, MD;  Location: [place];  Service: Thoracic;  Laterality: Right;  . Decortication  11/02/2011    Procedure: DECORTICATION;  Surgeon: Melrose Nakayama, MD;  Location: [place];  Service: Thoracic;  Laterality: Right;  . Pleural effusion drainage  11/02/2011    Procedure: DRAINAGE OF PLEURAL EFFUSION;  Surgeon: Melrose Nakayama, MD;  Location: [place];  Service: Thoracic;  Laterality: Right;  . Cholecystectomy  01/19/2012    Procedure: LAPAROSCOPIC CHOLECYSTECTOMY WITH INTRAOPERATIVE CHOLANGIOGRAM;  Surgeon: Zenovia Jarred, MD;  Location: [place];  Service: General;  Laterality: N/A;  laparoscopic cholecystectomy with intraoperative cholangiogram  . Ercp  01/22/2012    Procedure: ENDOSCOPIC RETROGRADE CHOLANGIOPANCREATOGRAPHY (ERCP);  Surgeon: Inda Castle, MD;  Location: [place];  Service: Endoscopy;  Laterality: N/A;  with stent placement  . Ercp  01/21/2012    Procedure: ENDOSCOPIC RETROGRADE CHOLANGIOPANCREATOGRAPHY (ERCP);  Surgeon: Milus Banister, MD;  Location: [place];  Service: Gastroenterology;  Laterality: N/A;  . Esophagogastroduodenoscopy  01/24/2012    Procedure: ESOPHAGOGASTRODUODENOSCOPY (EGD);  Surgeon: Inda Castle, MD;  Location: [place];  Service: Endoscopy;  Laterality: N/A;  remove pancr stent   . Endoscopic retrograde cholangiopancreatography (ercp) with propofol N/A 03/07/2012    Procedure: ENDOSCOPIC RETROGRADE CHOLANGIOPANCREATOGRAPHY (ERCP) WITH PROPOFOL;   Surgeon: Milus Banister, MD;  Location: [facility];  Service: Endoscopy;  Laterality: N/A;  remove stent  . Thyroidectomy N/A 04/04/2012    Procedure:  TOTAL THYROIDECTOMY WITH CENTRAL COMPARTMENT LYMPH NODE DISSECTION;  Surgeon: Earnstine Regal, MD;  Location: [facility];  Service: General;  Laterality: N/A;    Family History:  Family History  Problem Relation Age of Onset  . Arthritis Mother   . Heart failure Mother   . Hypertension Mother   . Diabetes Mother   . Heart failure Daughter     deceased age 60  . Other Daughter     deceased age 32 MVA    Social History:  reports that he has been smoking Cigarettes.  He started smoking about 59 years ago. He has a 90 pack-year smoking history. He has never used smokeless tobacco. He reports that he does not drink alcohol or use illicit drugs.  Allergies:  Allergies  Allergen Reactions  . Lyrica [Pregabalin] Hives and Itching    Medications:   Medication List    ASK your doctor about these medications       albuterol 108 (90 BASE) MCG/ACT inhaler  Commonly known as:  PROVENTIL HFA;VENTOLIN HFA  Inhale 1 puff into the lungs every 6 (six) hours as needed for shortness of breath.     albuterol (2.5 MG/3ML) 0.083% nebulizer solution  Commonly known as:  PROVENTIL  Take 3 mLs (2.5 mg total) by nebulization every 6 (six) hours as needed for wheezing.     albuterol (2.5 MG/3ML) 0.083% nebulizer solution  Commonly known as:  PROVENTIL  Take 3 mLs (2.5 mg total) by nebulization every 6 (six) hours as needed for wheezing.     budesonide-formoterol 160-4.5 MCG/ACT inhaler  Commonly known as:  SYMBICORT  Inhale 2 puffs into the lungs 2 (two) times daily.     calcium carbonate 1250 MG tablet  Commonly known as:  OS-CAL - dosed in mg of elemental calcium  Take 2 tablets by mouth 2 (two) times daily.     cefdinir 300 MG capsule  Commonly known as:  OMNICEF  Take 1 capsule (300 mg total) by mouth 2 (two) times daily.     dexamethasone  4 MG tablet  Commonly known as:  DECADRON  Take 4 mg by mouth See admin instructions. Take 1 day pre chemo Tx and 5 days after Tx.     folic acid 1 MG tablet  Commonly known as:  FOLVITE  Take 1 tablet (1 mg total) by mouth daily. Take daily starting 5-7 days before Alimta chemotherapy. Continue until 21 days after Alimta completed.     levothyroxine 150 MCG tablet  Commonly known as:  SYNTHROID, LEVOTHROID  Take 150 mcg by mouth daily before breakfast.     lidocaine-prilocaine cream  Commonly known as:  EMLA  Apply 1 application topically as needed.     lisinopril  10 MG tablet  Commonly known as:  PRINIVIL,ZESTRIL  TAKE ONE TABLET BY MOUTH TWICE DAILY     LORazepam 0.5 MG tablet  Commonly known as:  ATIVAN  Take 1 tablet (0.5 mg total) by mouth every 6 (six) hours as needed (Nausea or vomiting).     losartan 25 MG tablet  Commonly known as:  COZAAR  Take 25 mg by mouth daily.     metoprolol tartrate 25 MG tablet  Commonly known as:  LOPRESSOR  Take 12.5 mg by mouth 2 (two) times daily.     ondansetron 8 MG tablet  Commonly known as:  ZOFRAN  Take 1 tablet (8 mg total) by mouth 2 (two) times daily. Take two times a day starting the day after chemo for 3 days. Then take two times a day as needed for nausea or vomiting.     prochlorperazine 10 MG tablet  Commonly known as:  COMPAZINE  Take 1 tablet (10 mg total) by mouth every 6 (six) hours as needed (Nausea or vomiting).     Rivaroxaban 20 MG Tabs tablet  Commonly known as:  XARELTO  Take 20 mg by mouth daily with supper.     senna 8.6 MG Tabs tablet  Commonly known as:  SENOKOT  Take 2 tablets by mouth at bedtime.     sertraline 100 MG tablet  Commonly known as:  ZOLOFT  TAKE ONE TABLET BY MOUTH ONCE DAILY BEFORE BREAKFAST     SPIRIVA HANDIHALER 18 MCG inhalation capsule  Generic drug:  tiotropium  Place 18 mcg into inhaler and inhale every morning.     traMADol 50 MG tablet  Commonly known as:  ULTRAM  Take  50 mg by mouth every 6 (six) hours as needed for moderate pain or severe pain.     Vitamin D3 1000 UNITS Caps  Take 1 capsule by mouth daily.        Please HPI for pertinent positives, otherwise complete 10 system ROS negative.  Physical Exam: BP 157/63  Pulse 52  Temp(Src) 97.8 F (36.6 C) (Oral)  Resp 18  Ht 5' 8"  (1.727 m)  Wt 191 lb (86.637 kg)  BMI 29.05 kg/m2  SpO2 92% Body mass index is 29.05 kg/(m^2).   General Appearance:  Alert, cooperative, no distress, appears stated age  Head:  Normocephalic, without obvious abnormality, atraumatic  ENT: Unremarkable airway  Neck: Supple, symmetrical, trachea midline  Lungs:   Clear to auscultation bilaterally, no w/r/r,  Chest Wall:  No tenderness or deformity  Heart:  Regular rate and rhythm, S1, S2 normal, no murmur, rub or gallop.  Abdomen:   Soft, non-tender, non distended.  Extremities: Extremities normal, atraumatic, no cyanosis or edema  Neurologic: Normal affect, no gross deficits.   Results for orders placed during the hospital encounter of 04/02/13 (from the past 48 hour(s))  CK     Status: None   Collection Time    04/03/13 11:20 AM      Result Value Ref Range   Total CK 44  7 - 232 U/L  TROPONIN I     Status: None   Collection Time    04/03/13 11:20 AM      Result Value Ref Range   Troponin I <0.30  <0.30 ng/mL   Comment:            Due to the release kinetics of cTnI,     a negative result within the first hours     of the  onset of symptoms does not rule out     myocardial infarction with certainty.     If myocardial infarction is still suspected,     repeat the test at appropriate intervals.  CBC     Status: Abnormal   Collection Time    04/03/13  6:30 PM      Result Value Ref Range   WBC 6.9  4.0 - 10.5 K/uL   RBC 2.46 (*) 4.22 - 5.81 MIL/uL   Hemoglobin 7.9 (*) 13.0 - 17.0 g/dL   HCT 23.2 (*) 39.0 - 52.0 %   MCV 94.3  78.0 - 100.0 fL   MCH 32.1  26.0 - 34.0 pg   MCHC 34.1  30.0 - 36.0 g/dL    RDW 16.6 (*) 11.5 - 15.5 %   Platelets 70 (*) 150 - 400 K/uL   Comment: CONSISTENT WITH PREVIOUS RESULT  BASIC METABOLIC PANEL     Status: Abnormal   Collection Time    04/04/13  5:00 AM      Result Value Ref Range   Sodium 142  137 - 147 mEq/L   Potassium 3.9  3.7 - 5.3 mEq/L   Chloride 105  96 - 112 mEq/L   CO2 24  19 - 32 mEq/L   Glucose, Bld 76  70 - 99 mg/dL   BUN 27 (*) 6 - 23 mg/dL   Creatinine, Ser 1.10  0.50 - 1.35 mg/dL   Calcium 8.3 (*) 8.4 - 10.5 mg/dL   GFR calc non Af Amer 63 (*) >90 mL/min   GFR calc Af Amer 73 (*) >90 mL/min   Comment: (NOTE)     The eGFR has been calculated using the CKD EPI equation.     This calculation has not been validated in all clinical situations.     eGFR's persistently <90 mL/min signify possible Chronic Kidney     Disease.  CBC     Status: Abnormal   Collection Time    04/04/13  5:00 AM      Result Value Ref Range   WBC 5.9  4.0 - 10.5 K/uL   RBC 2.27 (*) 4.22 - 5.81 MIL/uL   Hemoglobin 7.4 (*) 13.0 - 17.0 g/dL   HCT 21.3 (*) 39.0 - 52.0 %   MCV 93.8  78.0 - 100.0 fL   MCH 32.6  26.0 - 34.0 pg   MCHC 34.7  30.0 - 36.0 g/dL   RDW 16.5 (*) 11.5 - 15.5 %   Platelets 63 (*) 150 - 400 K/uL   Comment: CONSISTENT WITH PREVIOUS RESULT  APTT     Status: None   Collection Time    04/04/13  5:00 AM      Result Value Ref Range   aPTT 33  24 - 37 seconds  PROTIME-INR     Status: None   Collection Time    04/04/13  5:00 AM      Result Value Ref Range   Prothrombin Time 13.7  11.6 - 15.2 seconds   INR 1.07  0.00 - 1.49  PREPARE RBC (CROSSMATCH)     Status: None   Collection Time    04/04/13 11:40 AM      Result Value Ref Range   Order Confirmation ORDER PROCESSED BY BLOOD BANK    TYPE AND SCREEN     Status: None   Collection Time    04/04/13 11:40 AM      Result Value Ref Range   ABO/RH(D) A POS  Antibody Screen NEG     Sample Expiration 04/07/2013     Unit Number [id number]     Blood Component Type RED CELLS,LR     Unit  division 00     Status of Unit ISSUED     Transfusion Status OK TO TRANSFUSE     Crossmatch Result Compatible     Unit Number [id number]     Blood Component Type RED CELLS,LR     Unit division 00     Status of Unit ISSUED     Transfusion Status OK TO TRANSFUSE     Crossmatch Result Compatible     Unit Number [id number]     Blood Component Type RED CELLS,LR     Unit division 00     Status of Unit ISSUED     Transfusion Status OK TO TRANSFUSE     Crossmatch Result Compatible    PREPARE RBC (CROSSMATCH)     Status: None   Collection Time    04/04/13  6:19 PM      Result Value Ref Range   Order Confirmation ORDER PROCESSED BY BLOOD BANK     No results found.  Assessment/Plan Hx of DVT with underlying Factor V leiden mutation and + lupus anticoagulant. Hematochezia and anemia requiring discontinuation of anticoagulation. High risk for VTE while off anticoagulation  Agree with indication for IVC filter Discussed procedure, risks, complications, use of sedation. Labs reviewed, acceptable for IJ venipuncture Consent signed in chart  Ascencion Dike PA-C 04/05/2013, 9:53 AM

## 2013-04-05 NOTE — Sedation Documentation (Signed)
Moved back to bed.  REady for Tx back to room.  Tolerated procedure well.

## 2013-04-06 LAB — TYPE AND SCREEN
ABO/RH(D): A POS
Antibody Screen: NEGATIVE
UNIT DIVISION: 0
UNIT DIVISION: 0
Unit division: 0

## 2013-04-06 LAB — BASIC METABOLIC PANEL
BUN: 28 mg/dL — AB (ref 6–23)
CHLORIDE: 98 meq/L (ref 96–112)
CO2: 30 meq/L (ref 19–32)
Calcium: 9.5 mg/dL (ref 8.4–10.5)
Creatinine, Ser: 1.18 mg/dL (ref 0.50–1.35)
GFR calc Af Amer: 67 mL/min — ABNORMAL LOW (ref >90)
GFR calc non Af Amer: 58 mL/min — ABNORMAL LOW (ref >90)
Glucose, Bld: 77 mg/dL (ref 70–99)
Potassium: 3.5 mEq/L — ABNORMAL LOW (ref 3.7–5.3)
Sodium: 140 mEq/L (ref 137–147)

## 2013-04-06 LAB — CBC
HEMATOCRIT: 30.4 % — AB (ref 39.0–52.0)
Hemoglobin: 10.8 g/dL — ABNORMAL LOW (ref 13.0–17.0)
MCH: 31.5 pg (ref 26.0–34.0)
MCHC: 35.5 g/dL (ref 30.0–36.0)
MCV: 88.6 fL (ref 78.0–100.0)
Platelets: 40 10*3/uL — ABNORMAL LOW (ref 150–400)
RBC: 3.43 MIL/uL — ABNORMAL LOW (ref 4.22–5.81)
RDW: 17.3 % — AB (ref 11.5–15.5)
WBC: 5.5 10*3/uL (ref 4.0–10.5)

## 2013-04-06 NOTE — Progress Notes (Signed)
PROGRESS NOTE  Steve [name]:[id number] DOB: 1933-12-19 DOA: 04/02/2013 PCP: Steve Homans, MD  Assessment/Plan: Lower GI bleed - no more bloody bowel movements, it appears to have stopped History of DVT/lupus anticoagulant positive -  Has had history of right lower extremity DVT and has been on anticoagulations for a few months, repeat study with chronic DVT. IVC filter 3/28 per IR. Appreciate hematology input, probably needs to be off of A/C for 1-2 weeks. He had a CT angiogram of the chest in February of 2015 which was negative for pulmonary embolism.  HTN - blood pressure start to trend up. Restarted Lisinopril. AAA - no abdominal pain  Anemia - likely multifactorial due to ongoing chemotherapy, cancer, GI bleed.  - s/p 3U pRBC. Repeat CBC stable AECOPD - Levofloxacin, steroids and breathing treatments. Improving, patient without any wheezing.  Mesothelioma of the right lung, stage III - currently undergoing chemotherapy. Oncology following.  OSA Syncope - patient currently asymptomatic, likely vasovagal with a possible component of lower GI bleed.  Diet: Heart healthy Fluids: None DVT Prophylaxis: SCDs  Code Status: Full code Family Communication: Discussed with patient  Disposition Plan: Inpatient  Consultants:  Gastroenterology  Pulmonology  Oncology  Procedures:  None   Antibiotics Levofloxacin 3/26>>  HPI/Subjective: No chest pain or dyspnea.  Objective: Filed Vitals:   04/05/13 2126 04/05/13 2158 04/06/13 0500 04/06/13 0633  BP: 128/64 129/58 154/61   Pulse: 61 72 80   Temp: 97.2 F (36.2 C) 97.6 F (36.4 C) 97.6 F (36.4 C)   TempSrc: Oral Oral Oral   Resp:   18   Height:      Weight:    87 kg (191 lb 12.8 oz)  SpO2: 91% 95% 95%     Intake/Output Summary (Last 24 hours) at 04/06/13 0805 Last data filed at 04/06/13 0501  Gross per 24 hour  Intake      0 ml  Output   1550 ml  Net  -1550 ml   Filed Weights   04/03/13 0320 04/05/13 0545  04/06/13 0633  Weight: 89.223 kg (196 lb 11.2 oz) 86.637 kg (191 lb) 87 kg (191 lb 12.8 oz)   Exam:  General:  NAD  Cardiovascular: regular rate and rhythm, without MRG  Respiratory: No wheezing, overall decreased breath sounds throughout.  Abdomen: soft, not tender to palpation, positive bowel sounds  MSK: no peripheral edema  Neuro: Grossly nonfocal  Data Reviewed: Basic Metabolic Panel:  Recent Labs Lab 04/02/13 1920 04/03/13 0613 04/04/13 0500 04/05/13 1345 04/06/13 0600  NA 139 140 142 139 140  K 4.0 4.2 3.9 3.6* 3.5*  CL 99 104 105 97 98  CO2 22 22 24 29 30   GLUCOSE 168* 155* 76 97 77  BUN 28* 33* 27* 22 28*  CREATININE 1.16 1.43* 1.10 1.02 1.18  CALCIUM 8.6 7.4* 8.3* 8.8 9.5   Liver Function Tests:  Recent Labs Lab 04/02/13 1920 04/03/13 0613  AST 29 18  ALT 26 20  ALKPHOS 98 73  BILITOT 0.6 0.4  PROT 6.8 5.7*  ALBUMIN 2.7* 2.2*    Recent Labs Lab 04/02/13 1920  LIPASE 27   CBC:  Recent Labs Lab 04/02/13 1920 04/03/13 0613 04/03/13 1830 04/04/13 0500 04/05/13 1345 04/06/13 0600  WBC 5.0 7.0 6.9 5.9 4.8 5.5  NEUTROABS 3.9 6.7  --   --   --   --   HGB 11.1* 8.7* 7.9* 7.4* 11.1* 10.8*  HCT 32.5* 25.7* 23.2* 21.3* 31.6* 30.4*  MCV 95.3  95.5 94.3 93.8 89.0 88.6  PLT 137* 85* 70* 63* 43* 40*   Cardiac Enzymes:  Recent Labs Lab 04/03/13 0613 04/03/13 1120  CKTOTAL  --  73  TROPONINI <0.30 <0.30   BNP (last 3 results)  Recent Labs  02/10/13 1808 04/02/13 1920  PROBNP 856.8* 1477.0*   Recent Results (from the past 240 hour(s))  CULTURE, BLOOD (ROUTINE X 2)     Status: None   Collection Time    04/02/13  7:20 PM      Result Value Ref Range Status   Specimen Description BLOOD RIGHT ARM   Final   Special Requests BOTTLES DRAWN AEROBIC AND ANAEROBIC 5CC   Final   Culture  Setup Time     Final   Value: 04/03/2013 00:31     Performed at [facility]   Culture     Final   Value:        BLOOD CULTURE RECEIVED NO GROWTH  TO DATE CULTURE WILL BE HELD FOR 5 DAYS BEFORE ISSUING A FINAL NEGATIVE REPORT     Performed at [facility]   Report Status PENDING   Incomplete  CULTURE, BLOOD (ROUTINE X 2)     Status: None   Collection Time    04/02/13  8:04 PM      Result Value Ref Range Status   Specimen Description BLOOD HAND RIGHT   Final   Special Requests BOTTLES DRAWN AEROBIC ONLY 6.5CC   Final   Culture  Setup Time     Final   Value: 04/03/2013 00:33     Performed at [facility]   Culture     Final   Value:        BLOOD CULTURE RECEIVED NO GROWTH TO DATE CULTURE WILL BE HELD FOR 5 DAYS BEFORE ISSUING A FINAL NEGATIVE REPORT     Performed at [facility]   Report Status PENDING   Incomplete   Studies: Ir Ivc Filter Plmt / S&i /img Guid/mod Sed  04/05/2013   CLINICAL DATA:  History of malignant mesothelioma and lower extremity DVT. The patient has developed hematochezia while on anticoagulation and requires an IVC filter due to a current contraindication to anticoagulation.  EXAM: 1. ULTRASOUND GUIDANCE FOR VASCULAR ACCESS OF THE RIGHT INTERNAL JUGULAR VEIN. 2. IVC VENOGRAM. 3. PERCUTANEOUS IVC FILTER PLACEMENT.  ANESTHESIA/SEDATION: 1.0 mg IV Versed; 50 mcg IV Fentanyl.  Total Moderate Sedation Time  79minutes.  CONTRAST:  37mL OMNIPAQUE IOHEXOL 300 MG/ML  SOLN  FLUOROSCOPY TIME:  1 minute and 36 seconds.  PROCEDURE: The procedure, risks, benefits, and alternatives were explained to the patient. Questions regarding the procedure were encouraged and answered. The patient understands and consents to the procedure.  The right neck was prepped with Betadine in a sterile fashion, and a sterile drape was applied covering the operative field. A sterile gown and sterile gloves were used for the procedure. Local anesthesia was provided with 1% Lidocaine. Ultrasound was used to confirm patency of the right internal jugular vein.  Under direct ultrasound guidance, a 21 gauge needle was advanced into the  right internal jugular vein with ultrasound image documentation performed. After securing access with a micropuncture dilator, a guidewire was advanced into the inferior vena cava. A deployment sheath was advanced over the guidewire. This was utilized to perform IVC venography.  The deployment sheath was further positioned in an appropriate location for filter deployment. A Bard Denali IVC filter was then advanced in the sheath. This  was then fully deployed in the infrarenal IVC. Final filter position was confirmed with a fluoroscopic spot image. Contrast injection was also performed through the sheath under fluoroscopy to confirm patency of the IVC at the level of the filter. After the procedure the sheath was removed and hemostasis obtained with manual compression.  COMPLICATIONS: None.  FINDINGS: IVC venography demonstrates a normal caliber IVC with no evidence of thrombus. Renal veins are identified bilaterally. The IVC filter was successfully positioned below the level of the renal veins and is appropriately oriented. This IVC filter has both permanent and retrievable indications.  IMPRESSION: Placement of percutaneous IVC filter in infrarenal IVC. IVC venogram shows no evidence of IVC thrombus and normal caliber of the inferior vena cava. This filter does have both permanent and retrievable indications.   Electronically Signed   By: Aletta Edouard M.D.   On: 04/05/2013 14:40    Scheduled Meds: . arformoterol  15 mcg Nebulization BID  . budesonide (PULMICORT) nebulizer solution  0.5 mg Nebulization BID  . calcium carbonate  2 tablet Oral BID WC  . cholecalciferol  1,000 Units Oral Daily  . folic acid  1 mg Oral Daily  . levofloxacin  750 mg Oral Daily  . levothyroxine  150 mcg Oral QAC breakfast  . lisinopril  10 mg Oral Daily  . metoprolol tartrate  12.5 mg Oral BID  . nicotine  21 mg Transdermal Daily  . polyethylene glycol  17 g Oral Daily  . predniSONE  40 mg Oral Q breakfast  . senna  2  tablet Oral QHS  . sertraline  100 mg Oral Daily  . sodium chloride  10-40 mL Intracatheter Q12H  . tiotropium  18 mcg Inhalation Daily   Continuous Infusions:  Principal Problem:   Hypotension Active Problems:   Hypertension   Atrial fibrillation   Sleep apnea   Mesothelioma (pleural)   Hypokalemia   COPD exacerbation   Syncope   Hematochezia   Acute posthemorrhagic anemia  Time spent: 25  This note has been created with Surveyor, quantity. Any transcriptional errors are unintentional.   Marzetta Board, MD [facility] Pager [phone]. If 7 PM - 7 AM, please contact night-coverage at [email or web address], password [facility] 04/06/2013, 8:05 AM  LOS: 4 days

## 2013-04-07 ENCOUNTER — Telehealth: Payer: Self-pay

## 2013-04-07 ENCOUNTER — Telehealth: Payer: Self-pay | Admitting: Family Medicine

## 2013-04-07 DIAGNOSIS — I719 Aortic aneurysm of unspecified site, without rupture: Secondary | ICD-10-CM

## 2013-04-07 DIAGNOSIS — D696 Thrombocytopenia, unspecified: Secondary | ICD-10-CM

## 2013-04-07 DIAGNOSIS — D649 Anemia, unspecified: Secondary | ICD-10-CM

## 2013-04-07 LAB — BASIC METABOLIC PANEL
BUN: 30 mg/dL — AB (ref 6–23)
CO2: 29 mEq/L (ref 19–32)
CREATININE: 1.18 mg/dL (ref 0.50–1.35)
Calcium: 9.7 mg/dL (ref 8.4–10.5)
Chloride: 100 mEq/L (ref 96–112)
GFR, EST AFRICAN AMERICAN: 67 mL/min — AB (ref >90)
GFR, EST NON AFRICAN AMERICAN: 58 mL/min — AB (ref >90)
Glucose, Bld: 81 mg/dL (ref 70–99)
Potassium: 3.5 mEq/L — ABNORMAL LOW (ref 3.7–5.3)
Sodium: 142 mEq/L (ref 137–147)

## 2013-04-07 LAB — CBC
HEMATOCRIT: 30.3 % — AB (ref 39.0–52.0)
Hemoglobin: 10.4 g/dL — ABNORMAL LOW (ref 13.0–17.0)
MCH: 30.9 pg (ref 26.0–34.0)
MCHC: 34.3 g/dL (ref 30.0–36.0)
MCV: 89.9 fL (ref 78.0–100.0)
Platelets: 37 10*3/uL — ABNORMAL LOW (ref 150–400)
RBC: 3.37 MIL/uL — ABNORMAL LOW (ref 4.22–5.81)
RDW: 16.6 % — ABNORMAL HIGH (ref 11.5–15.5)
WBC: 5 10*3/uL (ref 4.0–10.5)

## 2013-04-07 MED ORDER — LEVOFLOXACIN 750 MG PO TABS: 750.0000 mg | ORAL_TABLET | Freq: Every day | ORAL | Status: DC

## 2013-04-07 MED ORDER — POLYETHYLENE GLYCOL 3350 17 G PO PACK: 17.0000 g | PACK | Freq: Two times a day (BID) | ORAL | Status: AC

## 2013-04-07 MED ORDER — PREDNISONE 20 MG PO TABS: 40.0000 mg | ORAL_TABLET | Freq: Every day | ORAL | Status: DC

## 2013-04-07 NOTE — Telephone Encounter (Signed)
error 

## 2013-04-07 NOTE — Discharge Instructions (Signed)

## 2013-04-07 NOTE — Progress Notes (Signed)
Pt discharged per MD orders and protocol; DC IV and tele; DC instructions reviewed and signed by pt and spouse; prescriptions given to spouse.  Carollee Sires, RN

## 2013-04-07 NOTE — Telephone Encounter (Signed)
I informed patients spouse. Pt is going to come in on wed to get CBC checked and is going to see Einar Pheasant on April 2, 15 at 1:45 pm.  Lab order placed

## 2013-04-07 NOTE — Discharge Summary (Signed)
Physician Discharge Summary  Steve [name] EXN:[id number] DOB: 20-Feb-1933 DOA: 04/02/2013  PCP: Penni Homans, MD  Admit date: 04/02/2013 Discharge date: 04/07/2013  Time spent: 35 minutes  Recommendations for Outpatient Follow-up:  1. Follow up with PCP in 4-5 days 2. Follow up with Dr. Marin Olp as scheduled   Recommendations for primary care physician for things to follow:  Repeat CBC  Discharge Diagnoses:  Principal Problem:   Hypotension Active Problems:   Hypertension   Atrial fibrillation   Sleep apnea   Mesothelioma (pleural)   Hypokalemia   COPD exacerbation   Syncope   Hematochezia   Acute posthemorrhagic anemia   Anemia   Thrombocytopenia, unspecified  Discharge Condition: stable  Diet recommendation: heart healthy  Filed Weights   04/05/13 0545 04/06/13 4967 04/07/13 0514  Weight: 86.637 kg (191 lb) 87 kg (191 lb 12.8 oz) 86.6 kg (190 lb 14.7 oz)   History of present illness:  Steve [name] is a 80 y.o. male history of mesothelioma on chemotherapy, COPD on home oxygen, OSA, atrial fibrillation, abdominal aortic aneurysm has been experiencing constipation last 2 days and has taken milk of magnesia and started having bowel movements when patient while straining in the bathroom passed out. In addition patient's daughter found the patient was getting hypoxic. Patient was brought to the ER and was found to be hypotensive lactic acid levels are found to be elevated. Initially critical care was consulted with concerns for sepsis. At this time patient symptoms were felt to be secondary to dehydration and syncope was most likely secondary to straining during bowel movement. Patient denies any chest pain. Has been having some productive cough and wheezing. Denies any focal deficits headache abdominal pain nausea vomiting.   Hospital Course:  Syncope - patient asymptomatic throughout his hospitalization, his syncope was likely vasovagal with a possible component of lower  GI bleed. Lower GI bleed - GI has been consulted while patient hospitalized. Patient's rectal bleeding has stopped with discontinuation of his no more bloody bowel movements, it appears to have stopped after Xarelto has been discontinued. He was transfused a total of 3U pRBC and his hemoglobin has remained stable and patient without clinical evidence of ongoing bleed.  History of DVT/lupus anticoagulant positive - Has had history of right lower extremity DVT and has been on anticoagulations for a few months, repeat study positive for chronic DVT. Interventional radiology was consulted and patient had an IVC filter placed on 3/28 per IR. Appreciate hematology input, probably needs to be off of A/C for 1-2 weeks. He had a CT angiogram of the chest in February of 2015 which was negative for pulmonary embolism.  HTN - continue home medications, follow up with PCP in few days for monitoring.  AAA - no abdominal pain  Anemia - likely multifactorial due to ongoing chemotherapy, cancer, GI bleed.  - s/p 3U pRBC. Repeat CBC stable  AECOPD - Levofloxacin, steroids and breathing treatments. Improving, patient without any wheezing. Will be discharged to complete 3 more days of Levofloxacin and a steroid taper.  Mesothelioma of the right lung, stage III - currently undergoing chemotherapy. Oncology followed patient while hospitalized and will see him again in clinic.  OSA  Thrombocytopenia - likely related to his chemotherapy. Discussed to Dr. Marin Olp prior to discharge and he will follow up in clinic. No active bleeding.   Procedures:  IVC filter placement  Consultations:  Gastroenterology  Oncology  Interventional Radiology   Pulmonology   Discharge Exam: Filed Vitals:  04/06/13 2119 04/07/13 0514 04/07/13 0650 04/07/13 0745  BP: 182/78 181/80 161/62   Pulse: 61 53 58   Temp: 97.7 F (36.5 C) 98.6 F (37 C)    TempSrc: Oral Oral    Resp: 18 18    Height:      Weight:  86.6 kg (190 lb 14.7  oz)    SpO2: 95% 96%  92%    General: NAD Cardiovascular: RRR Respiratory: no wheezing, distant breath sounds  Discharge Instructions   Future Appointments Provider Department Dept Phone   04/14/2013 11:00 AM [facility] [phone]   Pt should arrive15 minutes prior to scheduled appt time. Please inform patient that exam will take a minimum of 1 1/2 hours. Patient to be NPO 6 hours prior to exam  and should not take any insulin the day of exam.   04/17/2013 9:15 AM [place] [phone]   04/17/2013 9:45 AM Volanda Napoleon, MD [place] [phone]   04/17/2013 10:30 AM [facility] [place] [phone]   04/30/2013 2:00 PM Collene Gobble, MD [facility] [phone]   05/08/2013 10:00 AM [facility] [place] [phone]   05/08/2013 10:30 AM Volanda Napoleon, MD [place] [phone]   05/08/2013 11:30 AM [facility] [place] [phone]       Medication List    STOP taking these medications       cefdinir 300 MG capsule  Commonly known as:  OMNICEF     dexamethasone 4 MG tablet  Commonly known as:  DECADRON     Rivaroxaban 20 MG Tabs tablet  Commonly known as:  XARELTO      TAKE these medications       albuterol 108 (90 BASE) MCG/ACT inhaler  Commonly known as:  PROVENTIL HFA;VENTOLIN HFA  Inhale 1 puff into the lungs every 6 (six) hours as needed for shortness of breath.     albuterol (2.5 MG/3ML) 0.083% nebulizer solution  Commonly known as:  PROVENTIL  Take 3 mLs (2.5 mg total) by nebulization every 6 (six) hours as needed for wheezing.     albuterol (2.5 MG/3ML) 0.083% nebulizer solution  Commonly known as:  PROVENTIL  Take 3 mLs (2.5 mg total) by nebulization every 6 (six) hours as needed for  wheezing.     budesonide-formoterol 160-4.5 MCG/ACT inhaler  Commonly known as:  SYMBICORT  Inhale 2 puffs into the lungs 2 (two) times daily.     calcium carbonate 1250 MG tablet  Commonly known as:  OS-CAL - dosed in mg of elemental calcium  Take 2 tablets by mouth 2 (two) times daily.     folic acid 1 MG tablet  Commonly known as:  FOLVITE  Take 1 tablet (1 mg total) by mouth daily. Take daily starting 5-7 days before Alimta chemotherapy. Continue until 21 days after Alimta completed.     levofloxacin 750 MG tablet  Commonly known as:  LEVAQUIN  Take 1 tablet (750 mg total) by mouth daily.     levothyroxine 150 MCG tablet  Commonly known as:  SYNTHROID, LEVOTHROID  Take 150 mcg by mouth daily before breakfast.     lidocaine-prilocaine cream  Commonly known as:  EMLA  Apply 1 application topically as needed.  lisinopril 10 MG tablet  Commonly known as:  PRINIVIL,ZESTRIL  TAKE ONE TABLET BY MOUTH TWICE DAILY     LORazepam 0.5 MG tablet  Commonly known as:  ATIVAN  Take 1 tablet (0.5 mg total) by mouth every 6 (six) hours as needed (Nausea or vomiting).     losartan 25 MG tablet  Commonly known as:  COZAAR  Take 25 mg by mouth daily.     metoprolol tartrate 25 MG tablet  Commonly known as:  LOPRESSOR  Take 12.5 mg by mouth 2 (two) times daily.     ondansetron 8 MG tablet  Commonly known as:  ZOFRAN  Take 1 tablet (8 mg total) by mouth 2 (two) times daily. Take two times a day starting the day after chemo for 3 days. Then take two times a day as needed for nausea or vomiting.     polyethylene glycol packet  Commonly known as:  MIRALAX  Take 17 g by mouth 2 (two) times daily.     predniSONE 20 MG tablet  Commonly known as:  DELTASONE  Take 2 tablets (40 mg total) by mouth daily with breakfast. 2 tablets daily for 3 days then 1 tablet daily for 3 days then 1/2 tablet daily for 4 days.     prochlorperazine 10 MG tablet  Commonly known as:  COMPAZINE  Take 1  tablet (10 mg total) by mouth every 6 (six) hours as needed (Nausea or vomiting).     senna 8.6 MG Tabs tablet  Commonly known as:  SENOKOT  Take 2 tablets by mouth at bedtime.     sertraline 100 MG tablet  Commonly known as:  ZOLOFT  TAKE ONE TABLET BY MOUTH ONCE DAILY BEFORE BREAKFAST     SPIRIVA HANDIHALER 18 MCG inhalation capsule  Generic drug:  tiotropium  Place 18 mcg into inhaler and inhale every morning.     traMADol 50 MG tablet  Commonly known as:  ULTRAM  Take 50 mg by mouth every 6 (six) hours as needed for moderate pain or severe pain.     Vitamin D3 1000 UNITS Caps  Take 1 capsule by mouth daily.           Follow-up Information   Follow up with Penni Homans, MD. Schedule an appointment as soon as possible for a visit in 4 days.   Specialty:  Family Medicine   Contact information:   [address] [place] [address] [phone]       The results of significant diagnostics from this hospitalization (including imaging, microbiology, ancillary and laboratory) are listed below for reference.    Significant Diagnostic Studies: Ct Head Wo Contrast  04/02/2013   CLINICAL DATA:  Hypertension.  Constipation.  Extremity weakness.  EXAM: CT HEAD WITHOUT CONTRAST  TECHNIQUE: Contiguous axial images were obtained from the base of the skull through the vertex without intravenous contrast.  COMPARISON:  Head MRI 05/18/2012  FINDINGS: There is mild cerebral atrophy, not significantly changed. Patchy hyperintensities in the cerebral white matter bilaterally are nonspecific but compatible with mild chronic small vessel ischemic disease. There is no evidence of acute cortical infarct, mass, midline shift, intracranial hemorrhage, or extra-axial fluid collection. Orbits are unremarkable. The mastoid air cells are clear. The right maxillary sinus is small and demonstrates moderate mucosal thickening, new from prior MRI.  IMPRESSION: No evidence of acute  intracranial abnormality. Mild chronic small vessel ischemic disease.   Electronically Signed   By: Logan Bores  On: 04/02/2013 21:36   Ct Abdomen Pelvis W Contrast  04/02/2013   CLINICAL DATA:  Hypertension. Constipation. Extremity weakness. Mesothelioma.  EXAM: CT ABDOMEN AND PELVIS WITH CONTRAST  TECHNIQUE: Multidetector CT imaging of the abdomen and pelvis was performed using the standard protocol following bolus administration of intravenous contrast.  CONTRAST:  159mL OMNIPAQUE IOHEXOL 300 MG/ML  SOLN  COMPARISON:  PET-CT 03/03/2013  FINDINGS: Irregular pleural thickening is partially visualized in the right lung base, similar to the prior PET-CT. Loculated fluid in the right major fissure has increased, incompletely visualized. Mild dependent atelectasis is present in the left lower lobe. Three-vessel coronary artery calcifications are present.  Calcification in the inferior right hepatic lobe is unchanged. The gallbladder is surgically absent. 2.4 cm soft tissue lesion adjacent to the upper pole of the right kidney is unchanged. 2.0 cm left upper pole renal cyst anteriorly is unchanged. The left kidney and adrenal glands are unremarkable. Small calcification in the pancreatic head is unchanged.  There is very mild mesenteric stranding adjacent to the distal descending colon in the left lower quadrant. Air-fluid levels are present in the colon, which is nondilated. No gross bowel wall thickening is identified. The appendix is identified in the right lower quadrant and is unremarkable. The small bowel is nondilated.  A Foley catheter is present in the bladder. No free fluid or enlarged lymph nodes are identified. Advanced atherosclerotic calcification is noted of the abdominal aorta and iliac arteries. Multiple healing, nondisplaced left-sided rib fractures are partially visualized as described on recent PET-CT. Mild T12 and moderate L1 compression fractures are unchanged.  IMPRESSION: 1. Fluid-filled  colon with very mild inflammatory stranding around the distal descending colon. No definite bowel wall thickening is seen, however mild colitis is not excluded. 2. Irregular pleural thickening in the right lung base, consistent with known midtibial EOMI. Fluid in the right major fissure has increased from the prior PET-CT. 3. Unchanged soft tissue lesion adjacent to the upper pole of the right kidney.   Electronically Signed   By: Logan Bores   On: 04/02/2013 21:54   Ir Ivc Filter Plmt / S&i /img Guid/mod Sed  04/05/2013   CLINICAL DATA:  History of malignant mesothelioma and lower extremity DVT. The patient has developed hematochezia while on anticoagulation and requires an IVC filter due to a current contraindication to anticoagulation.  EXAM: 1. ULTRASOUND GUIDANCE FOR VASCULAR ACCESS OF THE RIGHT INTERNAL JUGULAR VEIN. 2. IVC VENOGRAM. 3. PERCUTANEOUS IVC FILTER PLACEMENT.  ANESTHESIA/SEDATION: 1.0 mg IV Versed; 50 mcg IV Fentanyl.  Total Moderate Sedation Time  71minutes.  CONTRAST:  25mL OMNIPAQUE IOHEXOL 300 MG/ML  SOLN  FLUOROSCOPY TIME:  1 minute and 36 seconds.  PROCEDURE: The procedure, risks, benefits, and alternatives were explained to the patient. Questions regarding the procedure were encouraged and answered. The patient understands and consents to the procedure.  The right neck was prepped with Betadine in a sterile fashion, and a sterile drape was applied covering the operative field. A sterile gown and sterile gloves were used for the procedure. Local anesthesia was provided with 1% Lidocaine. Ultrasound was used to confirm patency of the right internal jugular vein.  Under direct ultrasound guidance, a 21 gauge needle was advanced into the right internal jugular vein with ultrasound image documentation performed. After securing access with a micropuncture dilator, a guidewire was advanced into the inferior vena cava. A deployment sheath was advanced over the guidewire. This was utilized to  perform IVC venography.  The deployment  sheath was further positioned in an appropriate location for filter deployment. A Bard Denali IVC filter was then advanced in the sheath. This was then fully deployed in the infrarenal IVC. Final filter position was confirmed with a fluoroscopic spot image. Contrast injection was also performed through the sheath under fluoroscopy to confirm patency of the IVC at the level of the filter. After the procedure the sheath was removed and hemostasis obtained with manual compression.  COMPLICATIONS: None.  FINDINGS: IVC venography demonstrates a normal caliber IVC with no evidence of thrombus. Renal veins are identified bilaterally. The IVC filter was successfully positioned below the level of the renal veins and is appropriately oriented. This IVC filter has both permanent and retrievable indications.  IMPRESSION: Placement of percutaneous IVC filter in infrarenal IVC. IVC venogram shows no evidence of IVC thrombus and normal caliber of the inferior vena cava. This filter does have both permanent and retrievable indications.   Electronically Signed   By: Aletta Edouard M.D.   On: 04/05/2013 14:40   Dg Chest Port 1 View  04/02/2013   CLINICAL DATA:  Hypotension. Constipation and extremity weakness. History of mesothelioma per prior radiology exams.  EXAM: PORTABLE CHEST - 1 VIEW  COMPARISON:  NM PET IMAGE RESTAG (PS) SKULL BASE TO THIGH dated 03/03/2013; CT ANGIO CHEST W/CM &/OR WO/CM dated 02/10/2013; DG RIBS BILATERAL W/CHEST dated 02/06/2013  FINDINGS: 1945 hr. Right IJ power port tip appears unchanged at the SVC right atrial level. There is stable volume loss in the right hemithorax with extensive pleural nodular thickening. Underlying interstitial prominence in both lungs has mildly increased. There is no confluent airspace opacity or significant pleural effusion. Surgical clips are noted at the thoracic inlet.  IMPRESSION: Similar overall appearance of the chest compared with  available prior studies. There is mildly increased interstitial prominence in both lungs which could be related to lower lung volumes or mild edema. Nodular pleural thickening on the right remains corresponding with known mesothelioma.   Electronically Signed   By: Camie Patience M.D.   On: 04/02/2013 20:12    Microbiology: Recent Results (from the past 240 hour(s))  CULTURE, BLOOD (ROUTINE X 2)     Status: None   Collection Time    04/02/13  7:20 PM      Result Value Ref Range Status   Specimen Description BLOOD RIGHT ARM   Final   Special Requests BOTTLES DRAWN AEROBIC AND ANAEROBIC 5CC   Final   Culture  Setup Time     Final   Value: 04/03/2013 00:31     Performed at [facility]   Culture     Final   Value:        BLOOD CULTURE RECEIVED NO GROWTH TO DATE CULTURE WILL BE HELD FOR 5 DAYS BEFORE ISSUING A FINAL NEGATIVE REPORT     Performed at [facility]   Report Status PENDING   Incomplete  CULTURE, BLOOD (ROUTINE X 2)     Status: None   Collection Time    04/02/13  8:04 PM      Result Value Ref Range Status   Specimen Description BLOOD HAND RIGHT   Final   Special Requests BOTTLES DRAWN AEROBIC ONLY 6.5CC   Final   Culture  Setup Time     Final   Value: 04/03/2013 00:33     Performed at [facility]   Culture     Final   Value:  BLOOD CULTURE RECEIVED NO GROWTH TO DATE CULTURE WILL BE HELD FOR 5 DAYS BEFORE ISSUING A FINAL NEGATIVE REPORT     Performed at [facility]   Report Status PENDING   Incomplete     Labs: Basic Metabolic Panel:  Recent Labs Lab 04/03/13 0613 04/04/13 0500 04/05/13 1345 04/06/13 0600 04/07/13 0510  NA 140 142 139 140 142  K 4.2 3.9 3.6* 3.5* 3.5*  CL 104 105 97 98 100  CO2 22 24 29 30 29   GLUCOSE 155* 76 97 77 81  BUN 33* 27* 22 28* 30*  CREATININE 1.43* 1.10 1.02 1.18 1.18  CALCIUM 7.4* 8.3* 8.8 9.5 9.7   Liver Function Tests:  Recent Labs Lab 04/02/13 1920 04/03/13 0613  AST 29 18  ALT 26 20   ALKPHOS 98 73  BILITOT 0.6 0.4  PROT 6.8 5.7*  ALBUMIN 2.7* 2.2*    Recent Labs Lab 04/02/13 1920  LIPASE 27   CBC:  Recent Labs Lab 04/02/13 1920 04/03/13 0613 04/03/13 1830 04/04/13 0500 04/05/13 1345 04/06/13 0600 04/07/13 0510  WBC 5.0 7.0 6.9 5.9 4.8 5.5 5.0  NEUTROABS 3.9 6.7  --   --   --   --   --   HGB 11.1* 8.7* 7.9* 7.4* 11.1* 10.8* 10.4*  HCT 32.5* 25.7* 23.2* 21.3* 31.6* 30.4* 30.3*  MCV 95.3 95.5 94.3 93.8 89.0 88.6 89.9  PLT 137* 85* 70* 63* 43* 40* 37*   Cardiac Enzymes:  Recent Labs Lab 04/03/13 0613 04/03/13 1120  CKTOTAL  --  77  TROPONINI <0.30 <0.30   BNP: BNP (last 3 results)  Recent Labs  02/10/13 1808 04/02/13 1920  PROBNP 856.8* 1477.0*   Signed:  Marzetta Board  [facility] 04/07/2013, 1:41 PM

## 2013-04-07 NOTE — Progress Notes (Signed)
Pt was discharged without port being heparinized; notified house coverage; per house coverage pt will come back to unit and IV team will heparinize pt; IV team will write progress note after.  Carollee Sires, RN

## 2013-04-07 NOTE — Progress Notes (Signed)
Removed foley per MD orders; will monitor for urine output.  Carollee Sires, RN

## 2013-04-09 LAB — CULTURE, BLOOD (ROUTINE X 2)
CULTURE: NO GROWTH
Culture: NO GROWTH

## 2013-04-09 LAB — CBC
HCT: 30.6 % — ABNORMAL LOW (ref 39.0–52.0)
Hemoglobin: 10.6 g/dL — ABNORMAL LOW (ref 13.0–17.0)
MCH: 30.5 pg (ref 26.0–34.0)
MCHC: 34.6 g/dL (ref 30.0–36.0)
MCV: 87.9 fL (ref 78.0–100.0)
Platelets: 13 10*3/uL — CL (ref 150–400)
RBC: 3.48 MIL/uL — ABNORMAL LOW (ref 4.22–5.81)
RDW: 16.5 % — ABNORMAL HIGH (ref 11.5–15.5)
WBC: 4 10*3/uL (ref 4.0–10.5)

## 2013-04-10 ENCOUNTER — Encounter: Payer: Self-pay | Admitting: Physician Assistant

## 2013-04-10 ENCOUNTER — Other Ambulatory Visit: Payer: Self-pay | Admitting: *Deleted

## 2013-04-10 ENCOUNTER — Ambulatory Visit (INDEPENDENT_AMBULATORY_CARE_PROVIDER_SITE_OTHER): Payer: Medicare Other | Admitting: Physician Assistant

## 2013-04-10 ENCOUNTER — Other Ambulatory Visit: Payer: Self-pay | Admitting: Family Medicine

## 2013-04-10 ENCOUNTER — Telehealth: Payer: Self-pay | Admitting: Physician Assistant

## 2013-04-10 VITALS — BP 118/52 | HR 60 | Temp 97.5°F | Resp 16 | Ht 68.0 in | Wt 197.0 lb

## 2013-04-10 DIAGNOSIS — D696 Thrombocytopenia, unspecified: Secondary | ICD-10-CM

## 2013-04-10 DIAGNOSIS — J441 Chronic obstructive pulmonary disease with (acute) exacerbation: Secondary | ICD-10-CM

## 2013-04-10 DIAGNOSIS — Z86718 Personal history of other venous thrombosis and embolism: Secondary | ICD-10-CM

## 2013-04-10 DIAGNOSIS — R55 Syncope and collapse: Secondary | ICD-10-CM

## 2013-04-10 NOTE — Progress Notes (Signed)
Pre visit review using our clinic review tool, if applicable. No additional management support is needed unless otherwise documented below in the visit note/SLS  

## 2013-04-10 NOTE — Telephone Encounter (Signed)
Spoke with Hematology/Oncology (Dr. Marin Olp) concerning patient's critical platelet count.  Oncologist recommended patient to stop by his office after appointment with [place] today for instructions.  Patient's chemotherapy can cause such low platelet values.  MD not concerned at present.  Will monitor and treat patient accordingly.

## 2013-04-10 NOTE — Patient Instructions (Signed)
Please finish your Levaquin and Steroid taper as directed.  Continue medication for COPD.  Move as much as possible -- make sure to move your legs and wiggle your toes to help push blood up and prevent swelling.  Elevate legs while resting at home.  Apply topical Bio-Freeze to legs.   If symptoms are not improving, please return to clinic.  If you develop chest pain, shortness of breath, lightheadedness, dizziness or rectal bleed, please proceed to the ER.    Remember to stop by Dr. Antonieta Pert office for appointment and instructions.

## 2013-04-12 ENCOUNTER — Other Ambulatory Visit: Payer: Self-pay | Admitting: Family Medicine

## 2013-04-14 ENCOUNTER — Ambulatory Visit (HOSPITAL_COMMUNITY)
Admission: RE | Admit: 2013-04-14 | Discharge: 2013-04-14 | Disposition: A | Discharge status: Home / Self Care | Payer: Medicare Other | Source: Ambulatory Visit | Attending: Hematology & Oncology | Admitting: Hematology & Oncology

## 2013-04-14 ENCOUNTER — Other Ambulatory Visit: Payer: Self-pay | Admitting: Nurse Practitioner

## 2013-04-14 ENCOUNTER — Encounter (HOSPITAL_COMMUNITY): Payer: Self-pay

## 2013-04-14 ENCOUNTER — Other Ambulatory Visit (HOSPITAL_BASED_OUTPATIENT_CLINIC_OR_DEPARTMENT_OTHER): Payer: Medicare Other

## 2013-04-14 DIAGNOSIS — I2584 Coronary atherosclerosis due to calcified coronary lesion: Secondary | ICD-10-CM | POA: Insufficient documentation

## 2013-04-14 DIAGNOSIS — C73 Malignant neoplasm of thyroid gland: Secondary | ICD-10-CM

## 2013-04-14 DIAGNOSIS — C457 Mesothelioma of other sites: Secondary | ICD-10-CM

## 2013-04-14 DIAGNOSIS — K439 Ventral hernia without obstruction or gangrene: Secondary | ICD-10-CM | POA: Insufficient documentation

## 2013-04-14 DIAGNOSIS — D696 Thrombocytopenia, unspecified: Secondary | ICD-10-CM

## 2013-04-14 DIAGNOSIS — J438 Other emphysema: Secondary | ICD-10-CM | POA: Insufficient documentation

## 2013-04-14 DIAGNOSIS — C801 Malignant (primary) neoplasm, unspecified: Secondary | ICD-10-CM | POA: Insufficient documentation

## 2013-04-14 DIAGNOSIS — C384 Malignant neoplasm of pleura: Secondary | ICD-10-CM

## 2013-04-14 LAB — COMPREHENSIVE METABOLIC PANEL (CC13)
ALK PHOS: 75 U/L (ref 40–150)
ALT: 26 U/L (ref 0–55)
AST: 17 U/L (ref 5–34)
Albumin: 3.1 g/dL — ABNORMAL LOW (ref 3.5–5.0)
Anion Gap: 13 mEq/L — ABNORMAL HIGH (ref 3–11)
BUN: 21.2 mg/dL (ref 7.0–26.0)
CALCIUM: 8.8 mg/dL (ref 8.4–10.4)
CHLORIDE: 104 meq/L (ref 98–109)
CO2: 29 mEq/L (ref 22–29)
Creatinine: 1 mg/dL (ref 0.7–1.3)
Glucose: 88 mg/dl (ref 70–140)
Potassium: 4 mEq/L (ref 3.5–5.1)
Sodium: 146 mEq/L — ABNORMAL HIGH (ref 136–145)
Total Bilirubin: 0.43 mg/dL (ref 0.20–1.20)
Total Protein: 6.7 g/dL (ref 6.4–8.3)

## 2013-04-14 LAB — CBC WITH DIFFERENTIAL/PLATELET
BASO%: 0.1 % (ref 0.0–2.0)
Basophils Absolute: 0 10*3/uL (ref 0.0–0.1)
EOS ABS: 0 10*3/uL (ref 0.0–0.5)
EOS%: 0 % (ref 0.0–7.0)
HCT: 30.6 % — ABNORMAL LOW (ref 38.4–49.9)
HGB: 10.2 g/dL — ABNORMAL LOW (ref 13.0–17.1)
LYMPH%: 20.3 % (ref 14.0–49.0)
MCH: 30.9 pg (ref 27.2–33.4)
MCHC: 33.4 g/dL (ref 32.0–36.0)
MCV: 92.5 fL (ref 79.3–98.0)
MONO#: 0.5 10*3/uL (ref 0.1–0.9)
MONO%: 7.5 % (ref 0.0–14.0)
NEUT%: 72.1 % (ref 39.0–75.0)
NEUTROS ABS: 5.1 10*3/uL (ref 1.5–6.5)
Platelets: 40 10*3/uL — ABNORMAL LOW (ref 140–400)
RBC: 3.3 10*6/uL — ABNORMAL LOW (ref 4.20–5.82)
RDW: 15.9 % — AB (ref 11.0–14.6)
WBC: 7.1 10*3/uL (ref 4.0–10.3)
lymph#: 1.4 10*3/uL (ref 0.9–3.3)

## 2013-04-14 LAB — HOLD TUBE, BLOOD BANK - CHCC SATELLITE

## 2013-04-14 LAB — LACTATE DEHYDROGENASE (CC13): LDH: 185 U/L (ref 125–245)

## 2013-04-14 LAB — GLUCOSE, CAPILLARY: Glucose-Capillary: 98 mg/dL (ref 70–99)

## 2013-04-14 MED ORDER — FLUDEOXYGLUCOSE F - 18 (FDG) INJECTION
10.9000 | Freq: Once | INTRAVENOUS | Status: AC | PRN
  Administered 2013-04-14: 10.9 via INTRAVENOUS

## 2013-04-14 NOTE — Progress Notes (Signed)
Steve [name] is a 80 y.o. male history of mesothelioma on chemotherapy, COPD on home oxygen, OSA, atrial fibrillation, abdominal aortic aneurysm, who presents to clinic today with his wife and daughter for hospital follow-up of vasovagal syncope and GI bleed. Patient had experienced 2 days of constipation and had syncopal event while straining in the bathroom.  Patient was found by daughter and was mildly hypoxic.  Patient taken to the ER.  Was found to be hypotensive with elevated lactic acid levels. Patient also noted to have GI bleed and his Xarelto was stopped.  Patient given 3 units of PRBCs.  Patient also found to have COPD exacerbation.  Was given prednisone taper and Rx for Levaquin.  Hospital Course: (taken from discharge summary) Syncope - patient asymptomatic throughout his hospitalization, his syncope was likely vasovagal with a possible component of lower GI bleed.  Lower GI bleed - GI has been consulted while patient hospitalized. Patient's rectal bleeding has stopped with discontinuation of his no more bloody bowel movements, it appears to have stopped after Xarelto has been discontinued. He was transfused a total of 3U pRBC and his hemoglobin has remained stable and patient without clinical evidence of ongoing bleed.  History of DVT/lupus anticoagulant positive - Has had history of right lower extremity DVT and has been on anticoagulations for a few months, repeat study positive for chronic DVT. Interventional radiology was consulted and patient had an IVC filter placed on 3/28 per IR. Appreciate hematology input, probably needs to be off of A/C for 1-2 weeks. He had a CT angiogram of the chest in February of 2015 which was negative for pulmonary embolism.  HTN - continue home medications, follow up with PCP in few days for monitoring.  AAA - no abdominal pain  Anemia - likely multifactorial due to ongoing chemotherapy, cancer, GI bleed.  - s/p 3U pRBC. Repeat CBC stable  AECOPD -  Levofloxacin, steroids and breathing treatments. Improving, patient without any wheezing. Will be discharged to complete 3 more days of Levofloxacin and a steroid taper.  Mesothelioma of the right lung, stage III - currently undergoing chemotherapy. Oncology followed patient while hospitalized and will see him again in clinic.  OSA  Thrombocytopenia - likely related to his chemotherapy. Discussed to Dr. Marin Olp prior to discharge and he will follow up in clinic. No active bleeding.  Procedures:  IVC filter placement  Since discharge patient states he has been improving daily.  Has been taking medications as directed.  Still has one day of prednisone and Levaquin.  Is continuing other medications as prescribed.  Denies SOB above baseline for his COPD and mesothelioma.  Has noted some fatigue.  Patient remains off of his Xarelto.  Denies hematochezia or melena.  Denies chest pain, palpitations, LH or dizziness.  Is staying well-hydrated.  Of note, CBC rechecked 2 days ago.  Anemia improving.  Critical platelet count of 13 was received.  On-call physician notified patient.  I personally spoke with Patient's oncologist Dr. Marin Olp concerning critical platelet count.  I was directed that this is typical of patient's chemotherapy regimen.  Patient to follow-up with Dr. Marin Olp for exam and repeat CBC.    Past Medical History  Diagnosis Date  . Prostate enlargement   . GERD (gastroesophageal reflux disease)   . AAA (abdominal aortic aneurysm)     stress test 09/14/10 [facility]  . Anxiety   . Recurrent upper respiratory infection (URI)     productive cough- white phlegm- no fever  . Hypertension  chest x ray 12/12 [facility]- repeated 06/06/11, EKG 11/12 [facility]  . DVT (deep venous thrombosis) 08/2011  . Pneumonia   . Bruises easily     takes Xarelto  . Depression   . Dysrhythmia     hx of atrial fib post op x 2 days   . Atrial fibrillation   . Mesothelioma   . Arthritis     knee and back  . Sleep apnea       12/12 sleep study,SEVERE per study  Dr Lamonte Sakai- states doesnt wear machine on regular basis- setting BiPAP 9/9  . H/O hiatal hernia   . COPD (chronic obstructive pulmonary disease)     patient uses oxygen 2L/[place] at nite   . Epistaxis 05/13/2012  . Olecranon bursitis of right elbow 06/28/2012    Current Outpatient Prescriptions on File Prior to Visit  Medication Sig Dispense Refill  . albuterol (PROVENTIL HFA;VENTOLIN HFA) 108 (90 BASE) MCG/ACT inhaler Inhale 1 puff into the lungs every 6 (six) hours as needed for shortness of breath.      Marland Kitchen albuterol (PROVENTIL) (2.5 MG/3ML) 0.083% nebulizer solution Take 3 mLs (2.5 mg total) by nebulization every 6 (six) hours as needed for wheezing.  360 mL  6  . albuterol (PROVENTIL) (2.5 MG/3ML) 0.083% nebulizer solution Take 3 mLs (2.5 mg total) by nebulization every 6 (six) hours as needed for wheezing.  360 mL  6  . budesonide-formoterol (SYMBICORT) 160-4.5 MCG/ACT inhaler Inhale 2 puffs into the lungs 2 (two) times daily.  1 Inhaler  6  . calcium carbonate (OS-CAL - DOSED IN MG OF ELEMENTAL CALCIUM) 1250 MG tablet Take 2 tablets by mouth 2 (two) times daily.      . Cholecalciferol (VITAMIN D3) 1000 UNITS CAPS Take 1 capsule by mouth daily.      . folic acid (FOLVITE) 1 MG tablet Take 1 tablet (1 mg total) by mouth daily. Take daily starting 5-7 days before Alimta chemotherapy. Continue until 21 days after Alimta completed.  100 tablet  3  . levothyroxine (SYNTHROID, LEVOTHROID) 150 MCG tablet Take 150 mcg by mouth daily before breakfast.      . lidocaine-prilocaine (EMLA) cream Apply 1 application topically as needed.  30 g  3  . lisinopril (PRINIVIL,ZESTRIL) 10 MG tablet TAKE ONE TABLET BY MOUTH TWICE DAILY  60 tablet  3  . LORazepam (ATIVAN) 0.5 MG tablet Take 1 tablet (0.5 mg total) by mouth every 6 (six) hours as needed (Nausea or vomiting).  30 tablet  0  . losartan (COZAAR) 25 MG tablet Take 25 mg by mouth daily.      . metoprolol tartrate (LOPRESSOR)  25 MG tablet Take 12.5 mg by mouth 2 (two) times daily.      . ondansetron (ZOFRAN) 8 MG tablet Take 1 tablet (8 mg total) by mouth 2 (two) times daily. Take two times a day starting the day after chemo for 3 days. Then take two times a day as needed for nausea or vomiting.  30 tablet  1  . polyethylene glycol (MIRALAX) packet Take 17 g by mouth 2 (two) times daily.  14 each  3  . predniSONE (DELTASONE) 20 MG tablet Take 2 tablets (40 mg total) by mouth daily with breakfast. 2 tablets daily for 3 days then 1 tablet daily for 3 days then 1/2 tablet daily for 4 days.  11 tablet  0  . prochlorperazine (COMPAZINE) 10 MG tablet Take 1 tablet (10 mg total) by mouth every 6 (six)  hours as needed (Nausea or vomiting).  30 tablet  1  . senna (SENOKOT) 8.6 MG TABS Take 2 tablets by mouth at bedtime.      . sertraline (ZOLOFT) 100 MG tablet TAKE ONE TABLET BY MOUTH ONCE DAILY BEFORE BREAKFAST  30 tablet  2  . SPIRIVA HANDIHALER 18 MCG inhalation capsule Place 18 mcg into inhaler and inhale every morning.       . traMADol (ULTRAM) 50 MG tablet Take 50 mg by mouth every 6 (six) hours as needed for moderate pain or severe pain.       No current facility-administered medications on file prior to visit.    Allergies  Allergen Reactions  . Lyrica [Pregabalin] Hives and Itching    Family History  Problem Relation Age of Onset  . Arthritis Mother   . Heart failure Mother   . Hypertension Mother   . Diabetes Mother   . Heart failure Daughter     deceased age 30  . Other Daughter     deceased age 78 MVA    History   Social History  . Marital Status: Married    Spouse Name: N/A    Number of Children: 6  . Years of Education: N/A   Occupational History  . retired    Social History Main Topics  . Smoking status: Current Every Day Smoker -- 1.50 packs/day for 60 years    Types: Cigarettes    Start date: 02/12/1954  . Smokeless tobacco: Never Used     Comment: was on E cigarettes now back on  regular cigarettes  . Alcohol Use: No  . Drug Use: No  . Sexual Activity: Not Currently   Other Topics Concern  . None   Social History Narrative  . None   Review of Systems - See HPI.  All other ROS are negative.  BP 118/52  Pulse 60  Temp(Src) 97.5 F (36.4 C) (Oral)  Resp 16  Ht 5' 8"  (1.727 m)  Wt 197 lb (89.359 kg)  BMI 29.96 kg/m2  SpO2 95%  Physical Exam  Vitals reviewed. Constitutional: He is oriented to person, place, and time and well-developed, well-nourished, and in no distress.  HENT:  Head: Normocephalic and atraumatic.  Right Ear: External ear normal.  Left Ear: External ear normal.  Nose: Nose normal.  Mouth/Throat: Oropharynx is clear and moist. No oropharyngeal exudate.  TM within normal limits bilaterally.  Eyes: Conjunctivae are normal. Pupils are equal, round, and reactive to light.  Neck: Neck supple.  Cardiovascular: Normal rate, regular rhythm, normal heart sounds and intact distal pulses.   Pulmonary/Chest: Effort normal and breath sounds normal. No respiratory distress. He has no wheezes. He has no rales. He exhibits no tenderness.  Neurological: He is alert and oriented to person, place, and time.  Skin: Skin is warm and dry. No rash noted.  Psychiatric: Affect normal.    Recent Results (from the past 2160 hour(s))  APTT     Status: None   Collection Time    01/22/13 10:25 AM      Result Value Ref Range   aPTT 28  24 - 37 seconds  CBC     Status: None   Collection Time    01/22/13 10:25 AM      Result Value Ref Range   WBC 8.2  4.0 - 10.5 K/uL   RBC 4.27  4.22 - 5.81 MIL/uL   Hemoglobin 13.9  13.0 - 17.0 g/dL   HCT 40.5  39.0 - 52.0 %   MCV 94.8  78.0 - 100.0 fL   MCH 32.6  26.0 - 34.0 pg   MCHC 34.3  30.0 - 36.0 g/dL   RDW 14.3  11.5 - 15.5 %   Platelets 151  150 - 400 K/uL  PROTIME-INR     Status: None   Collection Time    01/22/13 10:25 AM      Result Value Ref Range   Prothrombin Time 12.3  11.6 - 15.2 seconds   INR 0.93   0.00 - 1.49  CBC WITH DIFFERENTIAL ([facility])     Status: Abnormal   Collection Time    01/23/13  8:08 AM      Result Value Ref Range   WBC 9.9  4.0 - 10.0 10e3/uL   RBC 4.50  4.20 - 5.70 10e6/uL   HGB 14.4  13.0 - 17.1 g/dL   HCT 43.4  38.7 - 49.9 %   MCV 96  82 - 98 fL   MCH 32.0  28.0 - 33.4 pg   MCHC 33.2  32.0 - 35.9 g/dL   RDW 14.2  11.1 - 15.7 %   Platelets 156  145 - 400 10e3/uL   NEUT# 9.1 (*) 1.5 - 6.5 10e3/uL   LYMPH# 0.7 (*) 0.9 - 3.3 10e3/uL   MONO# 0.1  0.1 - 0.9 10e3/uL   Eosinophils Absolute 0.0  0.0 - 0.5 10e3/uL   BASO# 0.0  0.0 - 0.2 10e3/uL   NEUT% 92.0 (*) 40.0 - 80.0 %   LYMPH% 6.8 (*) 14.0 - 48.0 %   MONO% 1.2  0.0 - 13.0 %   EOS% 0.0  0.0 - 7.0 %   BASO% 0.0  0.0 - 2.0 %  COMPREHENSIVE METABOLIC PANEL ([facility] REFLEX ONLY)     Status: Abnormal   Collection Time    01/23/13  8:08 AM      Result Value Ref Range   Sodium 145  128 - 145 mEq/L   Potassium 3.8  3.3 - 4.7 mEq/L   Chloride 102  98 - 108 mEq/L   CO2 34 (*) 18 - 33 mEq/L   Glucose, Bld 163 (*) 73 - 118 mg/dL   BUN, Bld 16  7 - 22 mg/dL   Creat 1.1  0.6 - 1.2 mg/dl   Total Bilirubin 0.50  0.20 - 1.60 mg/dl   Alkaline Phosphatase 89 (*) 26 - 84 U/L   AST 15  11 - 38 U/L   ALT(SGPT) 15  10 - 47 U/L   Total Protein 8.5 (*) 6.4 - 8.1 g/dL   Albumin 3.2 (*) 3.3 - 5.5 g/dL   Calcium 9.9  8.0 - 10.3 mg/dL  CBC     Status: Abnormal   Collection Time    02/10/13  6:08 PM      Result Value Ref Range   WBC 5.5  4.0 - 10.5 K/uL   RBC 3.66 (*) 4.22 - 5.81 MIL/uL   Hemoglobin 11.8 (*) 13.0 - 17.0 g/dL   HCT 35.5 (*) 39.0 - 52.0 %   MCV 97.0  78.0 - 100.0 fL   MCH 32.2  26.0 - 34.0 pg   MCHC 33.2  30.0 - 36.0 g/dL   RDW 13.8  11.5 - 15.5 %   Platelets 171  150 - 400 K/uL  BASIC METABOLIC PANEL     Status: Abnormal   Collection Time    02/10/13  6:08 PM      Result Value  Ref Range   Sodium 140  137 - 147 mEq/L   Potassium 4.9  3.7 - 5.3 mEq/L   Chloride 103  96 - 112 mEq/L   CO2 24  19  - 32 mEq/L   Glucose, Bld 130 (*) 70 - 99 mg/dL   BUN 20  6 - 23 mg/dL   Creatinine, Ser 1.10  0.50 - 1.35 mg/dL   Calcium 8.5  8.4 - 10.5 mg/dL   GFR calc non Af Amer 63 (*) >90 mL/min   GFR calc Af Amer 73 (*) >90 mL/min   Comment: (NOTE)     The eGFR has been calculated using the CKD EPI equation.     This calculation has not been validated in all clinical situations.     eGFR's persistently <90 mL/min signify possible Chronic Kidney     Disease.  TROPONIN I     Status: None   Collection Time    02/10/13  6:08 PM      Result Value Ref Range   Troponin I <0.30  <0.30 ng/mL   Comment:            Due to the release kinetics of cTnI,     a negative result within the first hours     of the onset of symptoms does not rule out     myocardial infarction with certainty.     If myocardial infarction is still suspected,     repeat the test at appropriate intervals.  PRO B NATRIURETIC PEPTIDE     Status: Abnormal   Collection Time    02/10/13  6:08 PM      Result Value Ref Range   Pro B Natriuretic peptide (BNP) 856.8 (*) 0 - 450 pg/mL  CBC WITH DIFFERENTIAL ([facility])     Status: Abnormal   Collection Time    02/12/13  9:28 AM      Result Value Ref Range   WBC 6.9  4.0 - 10.0 10e3/uL   RBC 3.72 (*) 4.20 - 5.70 10e6/uL   HGB 11.9 (*) 13.0 - 17.1 g/dL   HCT 36.1 (*) 38.7 - 49.9 %   MCV 97  82 - 98 fL   MCH 32.0  28.0 - 33.4 pg   MCHC 33.0  32.0 - 35.9 g/dL   RDW 14.2  11.1 - 15.7 %   Platelets 213  145 - 400 10e3/uL   NEUT# 5.5  1.5 - 6.5 10e3/uL   LYMPH# 1.0  0.9 - 3.3 10e3/uL   MONO# 0.3  0.1 - 0.9 10e3/uL   Eosinophils Absolute 0.0  0.0 - 0.5 10e3/uL   BASO# 0.0  0.0 - 0.2 10e3/uL   NEUT% 80.8 (*) 40.0 - 80.0 %   LYMPH% 15.0  14.0 - 48.0 %   MONO% 4.1  0.0 - 13.0 %   EOS% 0.1  0.0 - 7.0 %   BASO% 0.0  0.0 - 2.0 %  COMPREHENSIVE METABOLIC PANEL ([facility] REFLEX ONLY)     Status: Abnormal   Collection Time    02/12/13  9:28 AM      Result Value Ref Range   Sodium  142  128 - 145 mEq/L   Potassium 3.8  3.3 - 4.7 mEq/L   Chloride 105  98 - 108 mEq/L   CO2 28  18 - 33 mEq/L   Glucose, Bld 134 (*) 73 - 118 mg/dL   BUN, Bld 20  7 - 22 mg/dL   Creat 1.2  0.6 - 1.2 mg/dl   Total Bilirubin 0.40  0.20 - 1.60 mg/dl   Alkaline Phosphatase 71  26 - 84 U/L   AST 19  11 - 38 U/L   ALT(SGPT) 23  10 - 47 U/L   Total Protein 7.5  6.4 - 8.1 g/dL   Albumin 3.0 (*) 3.3 - 5.5 g/dL   Calcium 8.3  8.0 - 10.3 mg/dL  GLUCOSE, CAPILLARY     Status: None   Collection Time    03/03/13 11:02 AM      Result Value Ref Range   Glucose-Capillary 81  70 - 99 mg/dL  CBC WITH DIFFERENTIAL ([facility])     Status: Abnormal   Collection Time    03/05/13 10:29 AM      Result Value Ref Range   WBC 4.3  4.0 - 10.0 10e3/uL   RBC 2.85 (*) 4.20 - 5.70 10e6/uL   HGB 9.1 (*) 13.0 - 17.1 g/dL   HCT 28.2 (*) 38.7 - 49.9 %   MCV 99 (*) 82 - 98 fL   MCH 31.9  28.0 - 33.4 pg   MCHC 32.3  32.0 - 35.9 g/dL   RDW 13.6  11.1 - 15.7 %   Platelets 130 (*) 145 - 400 10e3/uL   NEUT# 3.2  1.5 - 6.5 10e3/uL   LYMPH# 0.8 (*) 0.9 - 3.3 10e3/uL   MONO# 0.3  0.1 - 0.9 10e3/uL   Eosinophils Absolute 0.0  0.0 - 0.5 10e3/uL   BASO# 0.0  0.0 - 0.2 10e3/uL   NEUT% 75.0  40.0 - 80.0 %   LYMPH% 17.4  14.0 - 48.0 %   MONO% 6.7  0.0 - 13.0 %   EOS% 0.9  0.0 - 7.0 %   BASO% 0.0  0.0 - 2.0 %  COMPREHENSIVE METABOLIC PANEL ([facility] REFLEX ONLY)     Status: Abnormal   Collection Time    03/05/13 10:30 AM      Result Value Ref Range   Sodium 140  128 - 145 mEq/L   Potassium 3.9  3.3 - 4.7 mEq/L   Chloride 103  98 - 108 mEq/L   CO2 29  18 - 33 mEq/L   Glucose, Bld 151 (*) 73 - 118 mg/dL   BUN, Bld 19  7 - 22 mg/dL   Creat 0.5 (*) 0.6 - 1.2 mg/dl   Total Bilirubin 0.40  0.20 - 1.60 mg/dl   Alkaline Phosphatase 90 (*) 26 - 84 U/L   AST 20  11 - 38 U/L   ALT(SGPT) 18  10 - 47 U/L   Total Protein 7.0  6.4 - 8.1 g/dL   Albumin 2.5 (*) 3.3 - 5.5 g/dL   Calcium 8.6  8.0 - 10.3 mg/dL  PREALBUMIN      Status: Abnormal   Collection Time    03/05/13 10:30 AM      Result Value Ref Range   Prealbumin 10.2 (*) 17.0 - 34.0 mg/dL  CBC WITH DIFFERENTIAL ([facility])     Status: Abnormal   Collection Time    03/27/13  8:50 AM      Result Value Ref Range   WBC 7.0  4.0 - 10.0 10e3/uL   RBC 2.52 (*) 4.20 - 5.70 10e6/uL   HGB 8.2 (*) 13.0 - 17.1 g/dL   HCT 25.3 (*) 38.7 - 49.9 %   MCV 100 (*) 82 - 98 fL   MCH 32.5  28.0 - 33.4 pg   MCHC 32.4  32.0 - 35.9 g/dL   RDW 15.9 (*) 11.1 - 15.7 %   Platelets 141 (*) 145 - 400 10e3/uL   NEUT# 6.1  1.5 - 6.5 10e3/uL   LYMPH# 0.7 (*) 0.9 - 3.3 10e3/uL   MONO# 0.3  0.1 - 0.9 10e3/uL   Eosinophils Absolute 0.0  0.0 - 0.5 10e3/uL   BASO# 0.0  0.0 - 0.2 10e3/uL   NEUT% 86.7 (*) 40.0 - 80.0 %   LYMPH% 9.4 (*) 14.0 - 48.0 %   MONO% 3.7  0.0 - 13.0 %   EOS% 0.1  0.0 - 7.0 %   BASO% 0.1  0.0 - 2.0 %  COMPREHENSIVE METABOLIC PANEL ([facility] REFLEX ONLY)     Status: Abnormal   Collection Time    03/27/13  8:50 AM      Result Value Ref Range   Sodium 142  128 - 145 mEq/L   Potassium 3.8  3.3 - 4.7 mEq/L   Chloride 105  98 - 108 mEq/L   CO2 28  18 - 33 mEq/L   Glucose, Bld 162 (*) 73 - 118 mg/dL   BUN, Bld 17  7 - 22 mg/dL   Creat 1.1  0.6 - 1.2 mg/dl   Total Bilirubin 0.50  0.20 - 1.60 mg/dl   Alkaline Phosphatase 96 (*) 26 - 84 U/L   AST 20  11 - 38 U/L   ALT(SGPT) 14  10 - 47 U/L   Total Protein 7.9  6.4 - 8.1 g/dL   Albumin 2.8 (*) 3.3 - 5.5 g/dL   Calcium 8.7  8.0 - 10.3 mg/dL  TYPE AND SCREEN     Status: None   Collection Time    03/27/13 10:16 AM      Result Value Ref Range   ABO/RH(D) A POS     Antibody Screen NEG     Sample Expiration 03/30/2013     Unit Number [id number]     Blood Component Type RED CELLS,LR     Unit division 00     Status of Unit ISSUED,FINAL     Transfusion Status OK TO TRANSFUSE     Crossmatch Result Compatible     Unit Number [id number]     Blood Component Type RED CELLS,LR     Unit division 00      Status of Unit ISSUED,FINAL     Transfusion Status OK TO TRANSFUSE     Crossmatch Result Compatible    ABO/RH     Status: None   Collection Time    03/27/13 10:16 AM      Result Value Ref Range   ABO/RH(D) A POS    PREPARE RBC (CROSSMATCH)     Status: None   Collection Time    03/27/13 11:00 AM      Result Value Ref Range   Order Confirmation ORDER PROCESSED BY BLOOD BANK    COMPREHENSIVE METABOLIC PANEL     Status: Abnormal   Collection Time    04/02/13  7:20 PM      Result Value Ref Range   Sodium 139  137 - 147 mEq/L   Potassium 4.0  3.7 - 5.3 mEq/L   Chloride 99  96 - 112 mEq/L   CO2 22  19 - 32 mEq/L   Glucose, Bld 168 (*) 70 - 99 mg/dL   BUN 28 (*) 6 - 23 mg/dL   Creatinine, Ser 1.16  0.50 - 1.35 mg/dL   Calcium 8.6  8.4 - 10.5 mg/dL  Total Protein 6.8  6.0 - 8.3 g/dL   Albumin 2.7 (*) 3.5 - 5.2 g/dL   AST 29  0 - 37 U/L   Comment: HEMOLYSIS AT THIS LEVEL MAY AFFECT RESULT   ALT 26  0 - 53 U/L   Alkaline Phosphatase 98  39 - 117 U/L   Total Bilirubin 0.6  0.3 - 1.2 mg/dL   GFR calc non Af Amer 59 (*) >90 mL/min   GFR calc Af Amer 68 (*) >90 mL/min   Comment: (NOTE)     The eGFR has been calculated using the CKD EPI equation.     This calculation has not been validated in all clinical situations.     eGFR's persistently <90 mL/min signify possible Chronic Kidney     Disease.  LIPASE, BLOOD     Status: None   Collection Time    04/02/13  7:20 PM      Result Value Ref Range   Lipase 27  11 - 59 U/L  CBC WITH DIFFERENTIAL     Status: Abnormal   Collection Time    04/02/13  7:20 PM      Result Value Ref Range   WBC 5.0  4.0 - 10.5 K/uL   RBC 3.41 (*) 4.22 - 5.81 MIL/uL   Hemoglobin 11.1 (*) 13.0 - 17.0 g/dL   HCT 32.5 (*) 39.0 - 52.0 %   MCV 95.3  78.0 - 100.0 fL   MCH 32.6  26.0 - 34.0 pg   MCHC 34.2  30.0 - 36.0 g/dL   RDW 16.4 (*) 11.5 - 15.5 %   Platelets 137 (*) 150 - 400 K/uL   Neutrophils Relative % 78 (*) 43 - 77 %   Neutro Abs 3.9  1.7 - 7.7 K/uL    Lymphocytes Relative 21  12 - 46 %   Lymphs Abs 1.1  0.7 - 4.0 K/uL   Monocytes Relative 1 (*) 3 - 12 %   Monocytes Absolute 0.0 (*) 0.1 - 1.0 K/uL   Eosinophils Relative 0  0 - 5 %   Eosinophils Absolute 0.0  0.0 - 0.7 K/uL   Basophils Relative 0  0 - 1 %   Basophils Absolute 0.0  0.0 - 0.1 K/uL  PRO B NATRIURETIC PEPTIDE     Status: Abnormal   Collection Time    04/02/13  7:20 PM      Result Value Ref Range   Pro B Natriuretic peptide (BNP) 1477.0 (*) 0 - 450 pg/mL  PROTIME-INR     Status: Abnormal   Collection Time    04/02/13  7:20 PM      Result Value Ref Range   Prothrombin Time 15.6 (*) 11.6 - 15.2 seconds   INR 1.27  0.00 - 1.49  CULTURE, BLOOD (ROUTINE X 2)     Status: None   Collection Time    04/02/13  7:20 PM      Result Value Ref Range   Specimen Description BLOOD RIGHT ARM     Special Requests BOTTLES DRAWN AEROBIC AND ANAEROBIC 5CC     Culture  Setup Time       Value: 04/03/2013 00:31     Performed at [facility]   Culture       Value: NO GROWTH 5 DAYS     Performed at [facility]   Report Status 04/09/2013 FINAL    I-STAT CG4 LACTIC ACID, ED     Status: Abnormal   Collection Time  04/02/13  7:40 PM      Result Value Ref Range   Lactic Acid, Venous 3.39 (*) 0.5 - 2.2 mmol/L  CULTURE, BLOOD (ROUTINE X 2)     Status: None   Collection Time    04/02/13  8:04 PM      Result Value Ref Range   Specimen Description BLOOD HAND RIGHT     Special Requests BOTTLES DRAWN AEROBIC ONLY 6.5CC     Culture  Setup Time       Value: 04/03/2013 00:33     Performed at [facility]   Culture       Value: NO GROWTH 5 DAYS     Performed at [facility]   Report Status 04/09/2013 FINAL    URINALYSIS, ROUTINE W REFLEX MICROSCOPIC     Status: Abnormal   Collection Time    04/02/13  8:22 PM      Result Value Ref Range   Color, Urine YELLOW  YELLOW   APPearance CLEAR  CLEAR   Specific Gravity, Urine 1.011  1.005 - 1.030   pH 7.5  5.0 -  8.0   Glucose, UA 100 (*) NEGATIVE mg/dL   Hgb urine dipstick SMALL (*) NEGATIVE   Bilirubin Urine NEGATIVE  NEGATIVE   Ketones, ur NEGATIVE  NEGATIVE mg/dL   Protein, ur 100 (*) NEGATIVE mg/dL   Urobilinogen, UA 1.0  0.0 - 1.0 mg/dL   Nitrite NEGATIVE  NEGATIVE   Leukocytes, UA NEGATIVE  NEGATIVE  URINE MICROSCOPIC-ADD ON     Status: Abnormal   Collection Time    04/02/13  8:22 PM      Result Value Ref Range   Squamous Epithelial / LPF FEW (*) RARE   WBC, UA 0-2  <3 WBC/hpf   RBC / HPF 7-10  <3 RBC/hpf   Bacteria, UA RARE  RARE   Casts HYALINE CASTS (*) NEGATIVE   Urine-Other MUCOUS PRESENT    I-STAT ARTERIAL BLOOD GAS, ED     Status: Abnormal   Collection Time    04/02/13 10:17 PM      Result Value Ref Range   pH, Arterial 7.299 (*) 7.350 - 7.450   pCO2 arterial 44.7  35.0 - 45.0 mmHg   pO2, Arterial 84.0  80.0 - 100.0 mmHg   Bicarbonate 22.2  20.0 - 24.0 mEq/L   TCO2 24  0 - 100 mmol/L   O2 Saturation 96.0     Acid-base deficit 4.0 (*) 0.0 - 2.0 mmol/L   Patient temperature 97.0 F     Collection site RADIAL, ALLEN'S TEST ACCEPTABLE     Drawn by RT     Sample type ARTERIAL    LACTIC ACID, PLASMA     Status: Abnormal   Collection Time    04/02/13 11:10 PM      Result Value Ref Range   Lactic Acid, Venous 3.2 (*) 0.5 - 2.2 mmol/L  TROPONIN I     Status: None   Collection Time    04/03/13  6:13 AM      Result Value Ref Range   Troponin I <0.30  <0.30 ng/mL   Comment:            Due to the release kinetics of cTnI,     a negative result within the first hours     of the onset of symptoms does not rule out     myocardial infarction with certainty.     If myocardial infarction is still  suspected,     repeat the test at appropriate intervals.  LACTIC ACID, PLASMA     Status: None   Collection Time    04/03/13  6:13 AM      Result Value Ref Range   Lactic Acid, Venous 2.1  0.5 - 2.2 mmol/L  COMPREHENSIVE METABOLIC PANEL     Status: Abnormal   Collection Time     04/03/13  6:13 AM      Result Value Ref Range   Sodium 140  137 - 147 mEq/L   Potassium 4.2  3.7 - 5.3 mEq/L   Chloride 104  96 - 112 mEq/L   CO2 22  19 - 32 mEq/L   Glucose, Bld 155 (*) 70 - 99 mg/dL   BUN 33 (*) 6 - 23 mg/dL   Creatinine, Ser 1.43 (*) 0.50 - 1.35 mg/dL   Calcium 7.4 (*) 8.4 - 10.5 mg/dL   Total Protein 5.7 (*) 6.0 - 8.3 g/dL   Albumin 2.2 (*) 3.5 - 5.2 g/dL   AST 18  0 - 37 U/L   ALT 20  0 - 53 U/L   Alkaline Phosphatase 73  39 - 117 U/L   Total Bilirubin 0.4  0.3 - 1.2 mg/dL   GFR calc non Af Amer 46 (*) >90 mL/min   GFR calc Af Amer 53 (*) >90 mL/min   Comment: (NOTE)     The eGFR has been calculated using the CKD EPI equation.     This calculation has not been validated in all clinical situations.     eGFR's persistently <90 mL/min signify possible Chronic Kidney     Disease.  CBC WITH DIFFERENTIAL     Status: Abnormal   Collection Time    04/03/13  6:13 AM      Result Value Ref Range   WBC 7.0  4.0 - 10.5 K/uL   RBC 2.69 (*) 4.22 - 5.81 MIL/uL   Hemoglobin 8.7 (*) 13.0 - 17.0 g/dL   HCT 25.7 (*) 39.0 - 52.0 %   MCV 95.5  78.0 - 100.0 fL   MCH 32.3  26.0 - 34.0 pg   MCHC 33.9  30.0 - 36.0 g/dL   RDW 16.8 (*) 11.5 - 15.5 %   Platelets 85 (*) 150 - 400 K/uL   Comment: REPEATED TO VERIFY     PLATELET COUNT CONFIRMED BY SMEAR   Neutrophils Relative % 96 (*) 43 - 77 %   Lymphocytes Relative 3 (*) 12 - 46 %   Monocytes Relative 1 (*) 3 - 12 %   Eosinophils Relative 0  0 - 5 %   Basophils Relative 0  0 - 1 %   Neutro Abs 6.7  1.7 - 7.7 K/uL   Lymphs Abs 0.2 (*) 0.7 - 4.0 K/uL   Monocytes Absolute 0.1  0.1 - 1.0 K/uL   Eosinophils Absolute 0.0  0.0 - 0.7 K/uL   Basophils Absolute 0.0  0.0 - 0.1 K/uL   Smear Review MORPHOLOGY UNREMARKABLE    TSH     Status: None   Collection Time    04/03/13  6:13 AM      Result Value Ref Range   TSH 1.636  0.350 - 4.500 uIU/mL   Comment: Performed at [facility]  OCCULT BLOOD X 1 CARD TO LAB, STOOL      Status: Abnormal   Collection Time    04/03/13  9:25 AM      Result Value Ref Range  Fecal Occult Bld POSITIVE (*) NEGATIVE  CK     Status: None   Collection Time    04/03/13 11:20 AM      Result Value Ref Range   Total CK 44  7 - 232 U/L  TROPONIN I     Status: None   Collection Time    04/03/13 11:20 AM      Result Value Ref Range   Troponin I <0.30  <0.30 ng/mL   Comment:            Due to the release kinetics of cTnI,     a negative result within the first hours     of the onset of symptoms does not rule out     myocardial infarction with certainty.     If myocardial infarction is still suspected,     repeat the test at appropriate intervals.  CBC     Status: Abnormal   Collection Time    04/03/13  6:30 PM      Result Value Ref Range   WBC 6.9  4.0 - 10.5 K/uL   RBC 2.46 (*) 4.22 - 5.81 MIL/uL   Hemoglobin 7.9 (*) 13.0 - 17.0 g/dL   HCT 23.2 (*) 39.0 - 52.0 %   MCV 94.3  78.0 - 100.0 fL   MCH 32.1  26.0 - 34.0 pg   MCHC 34.1  30.0 - 36.0 g/dL   RDW 16.6 (*) 11.5 - 15.5 %   Platelets 70 (*) 150 - 400 K/uL   Comment: CONSISTENT WITH PREVIOUS RESULT  BASIC METABOLIC PANEL     Status: Abnormal   Collection Time    04/04/13  5:00 AM      Result Value Ref Range   Sodium 142  137 - 147 mEq/L   Potassium 3.9  3.7 - 5.3 mEq/L   Chloride 105  96 - 112 mEq/L   CO2 24  19 - 32 mEq/L   Glucose, Bld 76  70 - 99 mg/dL   BUN 27 (*) 6 - 23 mg/dL   Creatinine, Ser 1.10  0.50 - 1.35 mg/dL   Calcium 8.3 (*) 8.4 - 10.5 mg/dL   GFR calc non Af Amer 63 (*) >90 mL/min   GFR calc Af Amer 73 (*) >90 mL/min   Comment: (NOTE)     The eGFR has been calculated using the CKD EPI equation.     This calculation has not been validated in all clinical situations.     eGFR's persistently <90 mL/min signify possible Chronic Kidney     Disease.  CBC     Status: Abnormal   Collection Time    04/04/13  5:00 AM      Result Value Ref Range   WBC 5.9  4.0 - 10.5 K/uL   RBC 2.27 (*) 4.22 - 5.81  MIL/uL   Hemoglobin 7.4 (*) 13.0 - 17.0 g/dL   HCT 21.3 (*) 39.0 - 52.0 %   MCV 93.8  78.0 - 100.0 fL   MCH 32.6  26.0 - 34.0 pg   MCHC 34.7  30.0 - 36.0 g/dL   RDW 16.5 (*) 11.5 - 15.5 %   Platelets 63 (*) 150 - 400 K/uL   Comment: CONSISTENT WITH PREVIOUS RESULT  APTT     Status: None   Collection Time    04/04/13  5:00 AM      Result Value Ref Range   aPTT 33  24 - 37 seconds  PROTIME-INR  Status: None   Collection Time    04/04/13  5:00 AM      Result Value Ref Range   Prothrombin Time 13.7  11.6 - 15.2 seconds   INR 1.07  0.00 - 1.49  PREPARE RBC (CROSSMATCH)     Status: None   Collection Time    04/04/13 11:40 AM      Result Value Ref Range   Order Confirmation ORDER PROCESSED BY BLOOD BANK    TYPE AND SCREEN     Status: None   Collection Time    04/04/13 11:40 AM      Result Value Ref Range   ABO/RH(D) A POS     Antibody Screen NEG     Sample Expiration 04/07/2013     Unit Number [id number]     Blood Component Type RED CELLS,LR     Unit division 00     Status of Unit ISSUED,FINAL     Transfusion Status OK TO TRANSFUSE     Crossmatch Result Compatible     Unit Number [id number]     Blood Component Type RED CELLS,LR     Unit division 00     Status of Unit ISSUED,FINAL     Transfusion Status OK TO TRANSFUSE     Crossmatch Result Compatible     Unit Number [id number]     Blood Component Type RED CELLS,LR     Unit division 00     Status of Unit ISSUED,FINAL     Transfusion Status OK TO TRANSFUSE     Crossmatch Result Compatible    PREPARE RBC (CROSSMATCH)     Status: None   Collection Time    04/04/13  6:19 PM      Result Value Ref Range   Order Confirmation ORDER PROCESSED BY BLOOD BANK    BASIC METABOLIC PANEL     Status: Abnormal   Collection Time    04/05/13  1:45 PM      Result Value Ref Range   Sodium 139  137 - 147 mEq/L   Potassium 3.6 (*) 3.7 - 5.3 mEq/L   Chloride 97  96 - 112 mEq/L   CO2 29  19 - 32 mEq/L   Glucose, Bld 97  70 -  99 mg/dL   BUN 22  6 - 23 mg/dL   Creatinine, Ser 1.02  0.50 - 1.35 mg/dL   Calcium 8.8  8.4 - 10.5 mg/dL   GFR calc non Af Amer 69 (*) >90 mL/min   GFR calc Af Amer 80 (*) >90 mL/min   Comment: (NOTE)     The eGFR has been calculated using the CKD EPI equation.     This calculation has not been validated in all clinical situations.     eGFR's persistently <90 mL/min signify possible Chronic Kidney     Disease.  CBC     Status: Abnormal   Collection Time    04/05/13  1:45 PM      Result Value Ref Range   WBC 4.8  4.0 - 10.5 K/uL   RBC 3.55 (*) 4.22 - 5.81 MIL/uL   Hemoglobin 11.1 (*) 13.0 - 17.0 g/dL   Comment: DELTA CHECK NOTED     REPEATED TO VERIFY   HCT 31.6 (*) 39.0 - 52.0 %   MCV 89.0  78.0 - 100.0 fL   MCH 31.3  26.0 - 34.0 pg   MCHC 35.1  30.0 - 36.0 g/dL   RDW 17.7 (*) 11.5 - 15.5 %  Platelets 43 (*) 150 - 400 K/uL   Comment: DELTA CHECK NOTED     REPEATED TO VERIFY     CONSISTENT WITH PREVIOUS RESULT  CBC     Status: Abnormal   Collection Time    04/06/13  6:00 AM      Result Value Ref Range   WBC 5.5  4.0 - 10.5 K/uL   RBC 3.43 (*) 4.22 - 5.81 MIL/uL   Hemoglobin 10.8 (*) 13.0 - 17.0 g/dL   HCT 30.4 (*) 39.0 - 52.0 %   MCV 88.6  78.0 - 100.0 fL   MCH 31.5  26.0 - 34.0 pg   MCHC 35.5  30.0 - 36.0 g/dL   RDW 17.3 (*) 11.5 - 15.5 %   Platelets 40 (*) 150 - 400 K/uL   Comment: CONSISTENT WITH PREVIOUS RESULT  BASIC METABOLIC PANEL     Status: Abnormal   Collection Time    04/06/13  6:00 AM      Result Value Ref Range   Sodium 140  137 - 147 mEq/L   Potassium 3.5 (*) 3.7 - 5.3 mEq/L   Chloride 98  96 - 112 mEq/L   CO2 30  19 - 32 mEq/L   Glucose, Bld 77  70 - 99 mg/dL   BUN 28 (*) 6 - 23 mg/dL   Creatinine, Ser 1.18  0.50 - 1.35 mg/dL   Calcium 9.5  8.4 - 10.5 mg/dL   GFR calc non Af Amer 58 (*) >90 mL/min   GFR calc Af Amer 67 (*) >90 mL/min   Comment: (NOTE)     The eGFR has been calculated using the CKD EPI equation.     This calculation has not  been validated in all clinical situations.     eGFR's persistently <90 mL/min signify possible Chronic Kidney     Disease.  CBC     Status: Abnormal   Collection Time    04/07/13  5:10 AM      Result Value Ref Range   WBC 5.0  4.0 - 10.5 K/uL   RBC 3.37 (*) 4.22 - 5.81 MIL/uL   Hemoglobin 10.4 (*) 13.0 - 17.0 g/dL   HCT 30.3 (*) 39.0 - 52.0 %   MCV 89.9  78.0 - 100.0 fL   MCH 30.9  26.0 - 34.0 pg   MCHC 34.3  30.0 - 36.0 g/dL   RDW 16.6 (*) 11.5 - 15.5 %   Platelets 37 (*) 150 - 400 K/uL   Comment: CONSISTENT WITH PREVIOUS RESULT  BASIC METABOLIC PANEL     Status: Abnormal   Collection Time    04/07/13  5:10 AM      Result Value Ref Range   Sodium 142  137 - 147 mEq/L   Potassium 3.5 (*) 3.7 - 5.3 mEq/L   Chloride 100  96 - 112 mEq/L   CO2 29  19 - 32 mEq/L   Glucose, Bld 81  70 - 99 mg/dL   BUN 30 (*) 6 - 23 mg/dL   Creatinine, Ser 1.18  0.50 - 1.35 mg/dL   Calcium 9.7  8.4 - 10.5 mg/dL   GFR calc non Af Amer 58 (*) >90 mL/min   GFR calc Af Amer 67 (*) >90 mL/min   Comment: (NOTE)     The eGFR has been calculated using the CKD EPI equation.     This calculation has not been validated in all clinical situations.     eGFR's persistently <90 mL/min  signify possible Chronic Kidney     Disease.  CBC     Status: Abnormal   Collection Time    04/07/13  4:38 PM      Result Value Ref Range   WBC 4.0  4.0 - 10.5 K/uL   RBC 3.48 (*) 4.22 - 5.81 MIL/uL   Hemoglobin 10.6 (*) 13.0 - 17.0 g/dL   HCT 30.6 (*) 39.0 - 52.0 %   MCV 87.9  78.0 - 100.0 fL   MCH 30.5  26.0 - 34.0 pg   MCHC 34.6  30.0 - 36.0 g/dL   RDW 16.5 (*) 11.5 - 15.5 %   Platelets 13 (*) 150 - 400 K/uL   Comment: Result repeated and verified.     Platelet count confirmed on smear   Assessment/Plan: Syncope Vasovagal.  Patient transfused with 3 untis PRBCs.  Will continue to hold Xarelto over the next week.  CBC with stable hemoglobin.  Will recheck in 1 week.   COPD exacerbation Resolving.  Continue chronic  medications.  Finish course of Levaquin and Prednisone.  History of DVT (deep vein thrombosis) Patient with IVC placement. Holding Xarelto for one week due to GI bleed.  Patient to follow-up with Heme/Onc.  Thrombocytopenia, unspecified Platelet count of 13.  Xarelto on hold.  Discussed result with patient's Heme/Onc Dr. Marin Olp.  Patient instructed to schedule appointment with specialist within next few days.  Specialist states he will recheck CBC and manage patient appropriately.

## 2013-04-14 NOTE — Assessment & Plan Note (Signed)
Platelet count of 13.  Xarelto on hold.  Discussed result with patient's Heme/Onc Dr. Marin Olp.  Patient instructed to schedule appointment with specialist within next few days.  Specialist states he will recheck CBC and manage patient appropriately.

## 2013-04-14 NOTE — Assessment & Plan Note (Signed)
Patient with IVC placement. Holding Xarelto for one week due to GI bleed.  Patient to follow-up with Heme/Onc.

## 2013-04-14 NOTE — Assessment & Plan Note (Signed)
Vasovagal.  Patient transfused with 3 untis PRBCs.  Will continue to hold Xarelto over the next week.  CBC with stable hemoglobin.  Will recheck in 1 week.

## 2013-04-14 NOTE — Assessment & Plan Note (Signed)
Resolving.  Continue chronic medications.  Finish course of Levaquin and Prednisone.

## 2013-04-16 ENCOUNTER — Telehealth: Payer: Self-pay | Admitting: *Deleted

## 2013-04-16 ENCOUNTER — Other Ambulatory Visit: Payer: Self-pay | Admitting: *Deleted

## 2013-04-16 DIAGNOSIS — C450 Mesothelioma of pleura: Secondary | ICD-10-CM

## 2013-04-16 NOTE — Telephone Encounter (Addendum)
Message copied by Rico Ala on Wed Apr 16, 2013 11:43 AM ------      Message from: Steve [name]      Created: Wed Apr 16, 2013  7:10 AM       Call - mesothelioma still responding!!  NO spread of cancer!!!!  Laurey Arrow ------  Pt's wife notified by phone of above. dph

## 2013-04-17 ENCOUNTER — Other Ambulatory Visit (HOSPITAL_BASED_OUTPATIENT_CLINIC_OR_DEPARTMENT_OTHER): Payer: Medicare Other | Admitting: Lab

## 2013-04-17 ENCOUNTER — Encounter: Payer: Self-pay | Admitting: Hematology & Oncology

## 2013-04-17 ENCOUNTER — Ambulatory Visit (HOSPITAL_BASED_OUTPATIENT_CLINIC_OR_DEPARTMENT_OTHER): Payer: Medicare Other | Admitting: Hematology & Oncology

## 2013-04-17 ENCOUNTER — Ambulatory Visit (HOSPITAL_BASED_OUTPATIENT_CLINIC_OR_DEPARTMENT_OTHER): Payer: Medicare Other

## 2013-04-17 VITALS — BP 130/51 | HR 58 | Temp 97.8°F | Resp 20 | Ht 68.0 in | Wt 197.0 lb

## 2013-04-17 DIAGNOSIS — D6859 Other primary thrombophilia: Secondary | ICD-10-CM

## 2013-04-17 DIAGNOSIS — Z5111 Encounter for antineoplastic chemotherapy: Secondary | ICD-10-CM

## 2013-04-17 DIAGNOSIS — C450 Mesothelioma of pleura: Secondary | ICD-10-CM

## 2013-04-17 DIAGNOSIS — C384 Malignant neoplasm of pleura: Secondary | ICD-10-CM

## 2013-04-17 DIAGNOSIS — I82409 Acute embolism and thrombosis of unspecified deep veins of unspecified lower extremity: Secondary | ICD-10-CM

## 2013-04-17 DIAGNOSIS — D472 Monoclonal gammopathy: Secondary | ICD-10-CM

## 2013-04-17 DIAGNOSIS — D649 Anemia, unspecified: Secondary | ICD-10-CM

## 2013-04-17 DIAGNOSIS — C73 Malignant neoplasm of thyroid gland: Secondary | ICD-10-CM

## 2013-04-17 LAB — CBC WITH DIFFERENTIAL (CANCER CENTER ONLY)
BASO#: 0.1 10*3/uL (ref 0.0–0.2)
BASO%: 0.6 % (ref 0.0–2.0)
EOS ABS: 0 10*3/uL (ref 0.0–0.5)
EOS%: 0.1 % (ref 0.0–7.0)
HEMATOCRIT: 29.9 % — AB (ref 38.7–49.9)
HEMOGLOBIN: 9.9 g/dL — AB (ref 13.0–17.1)
LYMPH#: 1.4 10*3/uL (ref 0.9–3.3)
LYMPH%: 16.2 % (ref 14.0–48.0)
MCH: 31.8 pg (ref 28.0–33.4)
MCHC: 33.1 g/dL (ref 32.0–35.9)
MCV: 96 fL (ref 82–98)
MONO#: 0.9 10*3/uL (ref 0.1–0.9)
MONO%: 10.7 % (ref 0.0–13.0)
NEUT#: 6.4 10*3/uL (ref 1.5–6.5)
NEUT%: 72.4 % (ref 40.0–80.0)
Platelets: 85 10*3/uL — ABNORMAL LOW (ref 145–400)
RBC: 3.11 10*6/uL — ABNORMAL LOW (ref 4.20–5.70)
RDW: 17.4 % — ABNORMAL HIGH (ref 11.1–15.7)
WBC: 8.8 10*3/uL (ref 4.0–10.0)

## 2013-04-17 LAB — TECHNOLOGIST REVIEW CHCC SATELLITE

## 2013-04-17 MED ORDER — SODIUM CHLORIDE 0.9 % IJ SOLN
10.0000 mL | INTRAMUSCULAR | Status: DC | PRN
  Filled 2013-04-17: qty 10

## 2013-04-17 MED ORDER — SODIUM CHLORIDE 0.9 % IV SOLN
Freq: Once | INTRAVENOUS | Status: AC
  Administered 2013-04-17: 11:00:00 via INTRAVENOUS

## 2013-04-17 MED ORDER — ALTEPLASE 2 MG IJ SOLR
2.0000 mg | Freq: Once | INTRAMUSCULAR | Status: DC | PRN
  Filled 2013-04-17: qty 2

## 2013-04-17 MED ORDER — HEPARIN SOD (PORK) LOCK FLUSH 100 UNIT/ML IV SOLN
500.0000 [IU] | Freq: Once | INTRAVENOUS | Status: DC | PRN
  Filled 2013-04-17: qty 5

## 2013-04-17 MED ORDER — ONDANSETRON 16 MG/50ML IVPB (CHCC)
INTRAVENOUS | Status: AC
Start: 2013-04-17 — End: 2013-04-17
  Filled 2013-04-17: qty 16

## 2013-04-17 MED ORDER — SODIUM CHLORIDE 0.9 % IJ SOLN
3.0000 mL | INTRAMUSCULAR | Status: DC | PRN
  Filled 2013-04-17: qty 10

## 2013-04-17 MED ORDER — HEPARIN SOD (PORK) LOCK FLUSH 100 UNIT/ML IV SOLN
250.0000 [IU] | Freq: Once | INTRAVENOUS | Status: DC | PRN
  Filled 2013-04-17: qty 5

## 2013-04-17 MED ORDER — DEXAMETHASONE SODIUM PHOSPHATE 20 MG/5ML IJ SOLN
INTRAMUSCULAR | Status: AC
  Filled 2013-04-17: qty 5

## 2013-04-17 MED ORDER — SODIUM CHLORIDE 0.9 % IV SOLN
288.0000 mg/m2 | Freq: Once | INTRAVENOUS | Status: AC
  Administered 2013-04-17: 600 mg via INTRAVENOUS
  Filled 2013-04-17: qty 24

## 2013-04-17 MED ORDER — ONDANSETRON 16 MG/50ML IVPB (CHCC)
16.0000 mg | Freq: Once | INTRAVENOUS | Status: AC
  Administered 2013-04-17: 16 mg via INTRAVENOUS

## 2013-04-17 MED ORDER — DEXAMETHASONE SODIUM PHOSPHATE 20 MG/5ML IJ SOLN
20.0000 mg | Freq: Once | INTRAMUSCULAR | Status: AC
  Administered 2013-04-17: 20 mg via INTRAVENOUS

## 2013-04-17 MED ORDER — SODIUM CHLORIDE 0.9 % IV SOLN
384.0000 mg | Freq: Once | INTRAVENOUS | Status: AC
  Administered 2013-04-17: 380 mg via INTRAVENOUS
  Filled 2013-04-17: qty 38

## 2013-04-17 NOTE — Progress Notes (Signed)
Dr Marin Olp aware of pt's blood counts.  States he has reduced dosage and wants to tx pt.

## 2013-04-17 NOTE — Progress Notes (Signed)
Hematology and Oncology Follow Up Visit  Steve [name] [id number] [birth year] 80 y.o. 04/17/2013   Principle Diagnosis:   Mesothelioma of the right lung (stage III). 2. Heterozygote factor V Leiden mutation. 3. Lupus anticoagulant positive. 4. Deep venous thrombosis of the right leg. 5. 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  Current Therapy:    Status post 4 cycles of carboplatin/Alimta       Interim History:  [name] is back for followup. He was hospitalized recently. He was in because of marked anemia. He had lower GI bleeding. We went ahead and stopped his Xarelto. We had an IVC filter placed in the him.  He still a little bit weak.  Of course he still smoking quite a bit. I keep talking to him about this. He just is determined to keep smoking.  Thank you, his PET scan showed that his disease has not worsened. Everything looks pretty stable. He has a tiny bit of disease on the right side.  He is eating okay. Again he is fatigued. He is somewhat weak.  Clearly, we have to cut back his chemotherapy dose.  Currently, his performance status is ECOG 2  Medications: Current outpatient prescriptions:albuterol (PROVENTIL HFA;VENTOLIN HFA) 108 (90 BASE) MCG/ACT inhaler, Inhale 1 puff into the lungs every 6 (six) hours as needed for shortness of breath., Disp: , Rfl: ;  albuterol (PROVENTIL) (2.5 MG/3ML) 0.083% nebulizer solution, Take 3 mLs (2.5 mg total) by nebulization every 6 (six) hours as needed for wheezing., Disp: 360 mL, Rfl: 6 budesonide-formoterol (SYMBICORT) 160-4.5 MCG/ACT inhaler, Inhale 2 puffs into the lungs 2 (two) times daily., Disp: 1 Inhaler, Rfl: 6;  calcium carbonate (OS-CAL - DOSED IN MG OF ELEMENTAL CALCIUM) 1250 MG tablet, Take 2 tablets by mouth 2 (two) times daily., Disp: , Rfl: ;  Cholecalciferol (VITAMIN D3) 1000 UNITS CAPS, Take 1 capsule by mouth daily., Disp: , Rfl: ;  folic acid (FOLVITE) 1 MG tablet, Take 1 mg by mouth daily.,  Disp: , Rfl:  levothyroxine (SYNTHROID, LEVOTHROID) 150 MCG tablet, Take 150 mcg by mouth daily before breakfast., Disp: , Rfl: ;  lidocaine-prilocaine (EMLA) cream, Apply 1 application topically as needed., Disp: 30 g, Rfl: 3;  lisinopril (PRINIVIL,ZESTRIL) 10 MG tablet, TAKE ONE TABLET BY MOUTH TWICE DAILY, Disp: 60 tablet, Rfl: 3 LORazepam (ATIVAN) 0.5 MG tablet, Take 1 tablet (0.5 mg total) by mouth every 6 (six) hours as needed (Nausea or vomiting)., Disp: 30 tablet, Rfl: 0;  losartan (COZAAR) 25 MG tablet, Take 25 mg by mouth daily., Disp: , Rfl: ;  metoprolol tartrate (LOPRESSOR) 25 MG tablet, TAKE ONE-HALF TABLET BY MOUTH TWICE DAILY, Disp: 30 tablet, Rfl: 0 ondansetron (ZOFRAN) 8 MG tablet, Take 1 tablet (8 mg total) by mouth 2 (two) times daily. Take two times a day starting the day after chemo for 3 days. Then take two times a day as needed for nausea or vomiting., Disp: 30 tablet, Rfl: 1;  polyethylene glycol (MIRALAX) packet, Take 17 g by mouth 2 (two) times daily., Disp: 14 each, Rfl: 3 prochlorperazine (COMPAZINE) 10 MG tablet, Take 1 tablet (10 mg total) by mouth every 6 (six) hours as needed (Nausea or vomiting)., Disp: 30 tablet, Rfl: 1;  senna (SENOKOT) 8.6 MG TABS, Take 2 tablets by mouth at bedtime., Disp: , Rfl: ;  sertraline (ZOLOFT) 100 MG tablet, TAKE ONE TABLET BY MOUTH ONCE DAILY BEFORE BREAKFAST, Disp: 30 tablet, Rfl: 2 SPIRIVA HANDIHALER 18 MCG inhalation capsule, Place 18 mcg into  inhaler and inhale every morning. , Disp: , Rfl: ;  traMADol (ULTRAM) 50 MG tablet, Take 50 mg by mouth every 6 (six) hours as needed for moderate pain or severe pain., Disp: , Rfl: ;  predniSONE (DELTASONE) 20 MG tablet, Take 40 mg by mouth daily with breakfast. PT'S WIFE STATES IT WAS D/C IN HOSP., Disp: , Rfl:   Allergies:  Allergies  Allergen Reactions  . Lyrica [Pregabalin] Hives and Itching    Past Medical History, Surgical history, Social history, and Family History were reviewed and  updated.  Review of Systems: As above  Physical Exam:  height is 5\' 8"  (1.727 m) and weight is 197 lb (89.359 kg). His oral temperature is 97.8 F (36.6 C). His blood pressure is 130/51 and his pulse is 58. His respiration is 20 and oxygen saturation is 96%.   Slightly ill-appearing white gentleman. Lungs show decreased breath sounds throughout both lungs. Some wheezes are noted. Cardiac exam regular in rhythm with no murmurs rubs or bruits. Lymph node exam shows no palpable and plans. Neck is supple. Abdomen is soft. No palpable liver or spleen. Extremities shows some chronic edema in his legs. Skin exam shows some scattered ecchymoses. Neurological exam no focal deficits. 80 year old gentleman with mesothelioma of the right lung. You  Lab Results  Component Value Date   WBC 8.8 04/17/2013   HGB 9.9* 04/17/2013   HCT 29.9* 04/17/2013   MCV 96 04/17/2013   PLT 85* 04/17/2013     Chemistry      Component Value Date/Time   NA 146* 04/14/2013 1312   NA 142 04/07/2013 0510   NA 142 03/27/2013 0850   K 4.0 04/14/2013 1312   K 3.5* 04/07/2013 0510   K 3.8 03/27/2013 0850   CL 100 04/07/2013 0510   CL 105 03/27/2013 0850   CO2 29 04/14/2013 1312   CO2 29 04/07/2013 0510   CO2 28 03/27/2013 0850   BUN 21.2 04/14/2013 1312   BUN 30* 04/07/2013 0510   BUN 17 03/27/2013 0850   CREATININE 1.0 04/14/2013 1312   CREATININE 1.18 04/07/2013 0510   CREATININE 1.1 03/27/2013 0850      Component Value Date/Time   CALCIUM 8.8 04/14/2013 1312   CALCIUM 9.7 04/07/2013 0510   CALCIUM 8.7 03/27/2013 0850   ALKPHOS 75 04/14/2013 1312   ALKPHOS 73 04/03/2013 0613   ALKPHOS 96* 03/27/2013 0850   AST 17 04/14/2013 1312   AST 18 04/03/2013 0613   AST 20 03/27/2013 0850   ALT 26 04/14/2013 1312   ALT 20 04/03/2013 0613   ALT 14 03/27/2013 0850   BILITOT 0.43 04/14/2013 1312   BILITOT 0.4 04/03/2013 0613   BILITOT 0.50 03/27/2013 0850         Impression and Plan: Steve [name] is a 80 year old gentleman with mesothelioma of the right  lung. He's responded to chemotherapy. I am just not sure how much more he will be able to tolerate.  Again, we are cutting back his dose of chemotherapy.  I think we probably can just go with 2 more cycles of treatment. He does have other comorbid conditions. We have to keep in mind quality of life.  I am pretty confident that him continuing to smoke will be the cause of him to pass on in the future.  We will go ahead and plan to get him back in 4 weeks now. Will see if he can tolerate one further cycle of treatment.   Volanda Napoleon,  MD 4/9/201511:00 AM

## 2013-04-21 ENCOUNTER — Emergency Department (HOSPITAL_BASED_OUTPATIENT_CLINIC_OR_DEPARTMENT_OTHER): Payer: Medicare Other

## 2013-04-21 ENCOUNTER — Encounter (HOSPITAL_BASED_OUTPATIENT_CLINIC_OR_DEPARTMENT_OTHER): Payer: Self-pay | Admitting: Emergency Medicine

## 2013-04-21 ENCOUNTER — Ambulatory Visit (INDEPENDENT_AMBULATORY_CARE_PROVIDER_SITE_OTHER): Payer: Medicare Other | Admitting: Family

## 2013-04-21 ENCOUNTER — Encounter: Payer: Self-pay | Admitting: Family

## 2013-04-21 ENCOUNTER — Observation Stay (HOSPITAL_BASED_OUTPATIENT_CLINIC_OR_DEPARTMENT_OTHER)
Admission: EM | Admit: 2013-04-21 | Discharge: 2013-04-24 | Disposition: A | Discharge status: Home / Self Care | Payer: Medicare Other | Attending: Family Medicine | Admitting: Family Medicine

## 2013-04-21 VITALS — BP 100/58 | HR 55 | Temp 98.7°F | Resp 16 | Ht 68.0 in

## 2013-04-21 DIAGNOSIS — Z9049 Acquired absence of other specified parts of digestive tract: Secondary | ICD-10-CM

## 2013-04-21 DIAGNOSIS — K921 Melena: Secondary | ICD-10-CM | POA: Insufficient documentation

## 2013-04-21 DIAGNOSIS — K439 Ventral hernia without obstruction or gangrene: Secondary | ICD-10-CM

## 2013-04-21 DIAGNOSIS — I714 Abdominal aortic aneurysm, without rupture, unspecified: Secondary | ICD-10-CM

## 2013-04-21 DIAGNOSIS — M7989 Other specified soft tissue disorders: Secondary | ICD-10-CM | POA: Insufficient documentation

## 2013-04-21 DIAGNOSIS — I82431 Acute embolism and thrombosis of right popliteal vein: Secondary | ICD-10-CM

## 2013-04-21 DIAGNOSIS — D6481 Anemia due to antineoplastic chemotherapy: Secondary | ICD-10-CM | POA: Insufficient documentation

## 2013-04-21 DIAGNOSIS — IMO0002 Reserved for concepts with insufficient information to code with codable children: Secondary | ICD-10-CM

## 2013-04-21 DIAGNOSIS — I719 Aortic aneurysm of unspecified site, without rupture: Secondary | ICD-10-CM

## 2013-04-21 DIAGNOSIS — C782 Secondary malignant neoplasm of pleura: Secondary | ICD-10-CM | POA: Insufficient documentation

## 2013-04-21 DIAGNOSIS — C73 Malignant neoplasm of thyroid gland: Secondary | ICD-10-CM

## 2013-04-21 DIAGNOSIS — R42 Dizziness and giddiness: Secondary | ICD-10-CM

## 2013-04-21 DIAGNOSIS — G4700 Insomnia, unspecified: Secondary | ICD-10-CM

## 2013-04-21 DIAGNOSIS — M5416 Radiculopathy, lumbar region: Secondary | ICD-10-CM

## 2013-04-21 DIAGNOSIS — J9620 Acute and chronic respiratory failure, unspecified whether with hypoxia or hypercapnia: Secondary | ICD-10-CM

## 2013-04-21 DIAGNOSIS — J449 Chronic obstructive pulmonary disease, unspecified: Secondary | ICD-10-CM

## 2013-04-21 DIAGNOSIS — M79609 Pain in unspecified limb: Secondary | ICD-10-CM

## 2013-04-21 DIAGNOSIS — D500 Iron deficiency anemia secondary to blood loss (chronic): Secondary | ICD-10-CM | POA: Insufficient documentation

## 2013-04-21 DIAGNOSIS — D638 Anemia in other chronic diseases classified elsewhere: Secondary | ICD-10-CM | POA: Insufficient documentation

## 2013-04-21 DIAGNOSIS — I82411 Acute embolism and thrombosis of right femoral vein: Secondary | ICD-10-CM

## 2013-04-21 DIAGNOSIS — D696 Thrombocytopenia, unspecified: Secondary | ICD-10-CM

## 2013-04-21 DIAGNOSIS — C450 Mesothelioma of pleura: Secondary | ICD-10-CM

## 2013-04-21 DIAGNOSIS — G4730 Sleep apnea, unspecified: Secondary | ICD-10-CM

## 2013-04-21 DIAGNOSIS — I824Y9 Acute embolism and thrombosis of unspecified deep veins of unspecified proximal lower extremity: Principal | ICD-10-CM | POA: Insufficient documentation

## 2013-04-21 DIAGNOSIS — K810 Acute cholecystitis: Secondary | ICD-10-CM

## 2013-04-21 DIAGNOSIS — I480 Paroxysmal atrial fibrillation: Secondary | ICD-10-CM

## 2013-04-21 DIAGNOSIS — Z9089 Acquired absence of other organs: Secondary | ICD-10-CM | POA: Insufficient documentation

## 2013-04-21 DIAGNOSIS — M7021 Olecranon bursitis, right elbow: Secondary | ICD-10-CM

## 2013-04-21 DIAGNOSIS — R2681 Unsteadiness on feet: Secondary | ICD-10-CM

## 2013-04-21 DIAGNOSIS — E876 Hypokalemia: Secondary | ICD-10-CM

## 2013-04-21 DIAGNOSIS — R040 Epistaxis: Secondary | ICD-10-CM

## 2013-04-21 DIAGNOSIS — D472 Monoclonal gammopathy: Secondary | ICD-10-CM | POA: Insufficient documentation

## 2013-04-21 DIAGNOSIS — K432 Incisional hernia without obstruction or gangrene: Secondary | ICD-10-CM

## 2013-04-21 DIAGNOSIS — C3490 Malignant neoplasm of unspecified part of unspecified bronchus or lung: Secondary | ICD-10-CM | POA: Insufficient documentation

## 2013-04-21 DIAGNOSIS — M79606 Pain in leg, unspecified: Secondary | ICD-10-CM

## 2013-04-21 DIAGNOSIS — I10 Essential (primary) hypertension: Secondary | ICD-10-CM

## 2013-04-21 DIAGNOSIS — J189 Pneumonia, unspecified organism: Secondary | ICD-10-CM

## 2013-04-21 DIAGNOSIS — D62 Acute posthemorrhagic anemia: Secondary | ICD-10-CM

## 2013-04-21 DIAGNOSIS — I2584 Coronary atherosclerosis due to calcified coronary lesion: Secondary | ICD-10-CM | POA: Insufficient documentation

## 2013-04-21 DIAGNOSIS — S3613XA Injury of bile duct, initial encounter: Secondary | ICD-10-CM

## 2013-04-21 DIAGNOSIS — I4891 Unspecified atrial fibrillation: Secondary | ICD-10-CM

## 2013-04-21 DIAGNOSIS — Z86718 Personal history of other venous thrombosis and embolism: Secondary | ICD-10-CM

## 2013-04-21 DIAGNOSIS — I350 Nonrheumatic aortic (valve) stenosis: Secondary | ICD-10-CM

## 2013-04-21 DIAGNOSIS — E039 Hypothyroidism, unspecified: Secondary | ICD-10-CM | POA: Insufficient documentation

## 2013-04-21 DIAGNOSIS — Z720 Tobacco use: Secondary | ICD-10-CM

## 2013-04-21 DIAGNOSIS — Z79899 Other long term (current) drug therapy: Secondary | ICD-10-CM | POA: Insufficient documentation

## 2013-04-21 DIAGNOSIS — G4710 Hypersomnia, unspecified: Secondary | ICD-10-CM

## 2013-04-21 DIAGNOSIS — F17200 Nicotine dependence, unspecified, uncomplicated: Secondary | ICD-10-CM | POA: Insufficient documentation

## 2013-04-21 DIAGNOSIS — D649 Anemia, unspecified: Secondary | ICD-10-CM

## 2013-04-21 DIAGNOSIS — G4733 Obstructive sleep apnea (adult) (pediatric): Secondary | ICD-10-CM | POA: Insufficient documentation

## 2013-04-21 DIAGNOSIS — D6859 Other primary thrombophilia: Secondary | ICD-10-CM | POA: Insufficient documentation

## 2013-04-21 DIAGNOSIS — Z8719 Personal history of other diseases of the digestive system: Secondary | ICD-10-CM

## 2013-04-21 DIAGNOSIS — K219 Gastro-esophageal reflux disease without esophagitis: Secondary | ICD-10-CM | POA: Insufficient documentation

## 2013-04-21 DIAGNOSIS — R0781 Pleurodynia: Secondary | ICD-10-CM

## 2013-04-21 DIAGNOSIS — F3289 Other specified depressive episodes: Secondary | ICD-10-CM | POA: Insufficient documentation

## 2013-04-21 DIAGNOSIS — F32A Depression, unspecified: Secondary | ICD-10-CM

## 2013-04-21 DIAGNOSIS — R001 Bradycardia, unspecified: Secondary | ICD-10-CM

## 2013-04-21 DIAGNOSIS — F329 Major depressive disorder, single episode, unspecified: Secondary | ICD-10-CM | POA: Insufficient documentation

## 2013-04-21 DIAGNOSIS — Z77090 Contact with and (suspected) exposure to asbestos: Secondary | ICD-10-CM | POA: Insufficient documentation

## 2013-04-21 DIAGNOSIS — O2230 Deep phlebothrombosis in pregnancy, unspecified trimester: Secondary | ICD-10-CM

## 2013-04-21 DIAGNOSIS — D6959 Other secondary thrombocytopenia: Secondary | ICD-10-CM | POA: Insufficient documentation

## 2013-04-21 DIAGNOSIS — N400 Enlarged prostate without lower urinary tract symptoms: Secondary | ICD-10-CM

## 2013-04-21 DIAGNOSIS — Z9889 Other specified postprocedural states: Secondary | ICD-10-CM

## 2013-04-21 DIAGNOSIS — J441 Chronic obstructive pulmonary disease with (acute) exacerbation: Secondary | ICD-10-CM

## 2013-04-21 DIAGNOSIS — I959 Hypotension, unspecified: Secondary | ICD-10-CM

## 2013-04-21 DIAGNOSIS — I2510 Atherosclerotic heart disease of native coronary artery without angina pectoris: Secondary | ICD-10-CM | POA: Insufficient documentation

## 2013-04-21 DIAGNOSIS — T451X5A Adverse effect of antineoplastic and immunosuppressive drugs, initial encounter: Secondary | ICD-10-CM | POA: Insufficient documentation

## 2013-04-21 LAB — CBC WITH DIFFERENTIAL/PLATELET
BASOS ABS: 0 10*3/uL (ref 0.0–0.1)
Basophils Relative: 0 % (ref 0–1)
Eosinophils Absolute: 0 10*3/uL (ref 0.0–0.7)
Eosinophils Relative: 0 % (ref 0–5)
HCT: 24.8 % — ABNORMAL LOW (ref 39.0–52.0)
Hemoglobin: 8.1 g/dL — ABNORMAL LOW (ref 13.0–17.0)
Lymphocytes Relative: 13 % (ref 12–46)
Lymphs Abs: 0.9 10*3/uL (ref 0.7–4.0)
MCH: 31.8 pg (ref 26.0–34.0)
MCHC: 32.7 g/dL (ref 30.0–36.0)
MCV: 97.3 fL (ref 78.0–100.0)
Monocytes Absolute: 0.2 10*3/uL (ref 0.1–1.0)
Monocytes Relative: 2 % — ABNORMAL LOW (ref 3–12)
NEUTROS ABS: 5.8 10*3/uL (ref 1.7–7.7)
Neutrophils Relative %: 84 % — ABNORMAL HIGH (ref 43–77)
Platelets: 105 10*3/uL — ABNORMAL LOW (ref 150–400)
RBC: 2.55 MIL/uL — ABNORMAL LOW (ref 4.22–5.81)
RDW: 17.7 % — AB (ref 11.5–15.5)
WBC: 6.9 10*3/uL (ref 4.0–10.5)

## 2013-04-21 LAB — OCCULT BLOOD X 1 CARD TO LAB, STOOL: FECAL OCCULT BLD: NEGATIVE

## 2013-04-21 LAB — BASIC METABOLIC PANEL
BUN: 24 mg/dL — ABNORMAL HIGH (ref 6–23)
CHLORIDE: 102 meq/L (ref 96–112)
CO2: 28 mEq/L (ref 19–32)
CREATININE: 1.2 mg/dL (ref 0.50–1.35)
Calcium: 8.4 mg/dL (ref 8.4–10.5)
GFR calc non Af Amer: 57 mL/min — ABNORMAL LOW (ref >90)
GFR, EST AFRICAN AMERICAN: 66 mL/min — AB (ref >90)
Glucose, Bld: 128 mg/dL — ABNORMAL HIGH (ref 70–99)
POTASSIUM: 4 meq/L (ref 3.7–5.3)
Sodium: 142 mEq/L (ref 137–147)

## 2013-04-21 MED ORDER — ENOXAPARIN SODIUM 40 MG/0.4ML ~~LOC~~ SOLN
30.0000 mg | Freq: Once | SUBCUTANEOUS | Status: AC
  Administered 2013-04-21: 30 mg via SUBCUTANEOUS
  Filled 2013-04-21: qty 0.4

## 2013-04-21 MED ORDER — NICOTINE 21 MG/24HR TD PT24
21.0000 mg | MEDICATED_PATCH | Freq: Every day | TRANSDERMAL | Status: DC
Start: 2013-04-22 — End: 2013-04-24
  Administered 2013-04-21 – 2013-04-23 (×3): 21 mg via TRANSDERMAL
  Filled 2013-04-21 (×4): qty 1

## 2013-04-21 NOTE — Assessment & Plan Note (Addendum)
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  He has an ivc filter, but is having significant pain and inability to stand or ambulate at this time.  Pt needs lower extremity dopplers to exclude recurrent DVT.  I have advised that he be evaluated in the ED this evening. Patient, wife and daughter verbalize understanding.

## 2013-04-21 NOTE — Progress Notes (Signed)
Subjective:    Patient ID: Steve [name], male    DOB: 1933/06/10, 80 y.o.   MRN: [id number]  HPI  Steve [name] is a 80 yr old male who presents today with chief complaint of leg swelling. He has hx of mesothelioma of the right lung (stage III) which is followed by Dr. Marin Olp.  He is undergoing chemotherapy at this time. Of note, he was recently hospitalized with lower GI bleed and his xarelto was discontinued. He has hx of factor V Leiden  Mutation and lupus anticoagulant positive.  He had an IVC filter placed during his most recent hospitalization and has remained off of anticoagulation. His most recent blood count was drawn on 4/9. HgB was 9.9 that day.   Today he reports swelling to both of his legs. He reports pain both calves which has been present x > 1 week.  He report that his SOB is at baseline.  He reports that pain and swelling is > in the right leg than the left leg. For the last 2 days, family reports that he has been unable to stand up due to the pain in the right leg.  Pain is worsening. H   Review of Systems See HPI  Past Medical History  Diagnosis Date  . Prostate enlargement   . GERD (gastroesophageal reflux disease)   . AAA (abdominal aortic aneurysm)     stress test 09/14/10 [facility]  . Anxiety   . Recurrent upper respiratory infection (URI)     productive cough- white phlegm- no fever  . Hypertension     chest x ray 12/12 EP[facility]- repeated 06/06/11, EKG 11/12 [facility]  . DVT (deep venous thrombosis) 08/2011  . Pneumonia   . Bruises easily     takes Xarelto  . Depression   . Dysrhythmia     hx of atrial fib post op x 2 days   . Atrial fibrillation   . Mesothelioma   . Arthritis     knee and back  . Sleep apnea      12/12 sleep study,SEVERE per study  Dr Lamonte Sakai- states doesnt wear machine on regular basis- setting BiPAP 9/9  . H/O hiatal hernia   . COPD (chronic obstructive pulmonary disease)     patient uses oxygen 2L/[place] at nite   . Epistaxis 05/13/2012  . Olecranon  bursitis of right elbow 06/28/2012    History   Social History  . Marital Status: Married    Spouse Name: N/A    Number of Children: 6  . Years of Education: N/A   Occupational History  . retired    Social History Main Topics  . Smoking status: Current Every Day Smoker -- 1.50 packs/day for 60 years    Types: Cigarettes    Start date: 02/12/1954  . Smokeless tobacco: Never Used     Comment: was on E cigarettes now back on regular cigarettes  . Alcohol Use: No  . Drug Use: No  . Sexual Activity: Not Currently   Other Topics Concern  . Not on file   Social History Narrative  . No narrative on file    Past Surgical History  Procedure Laterality Date  . Knee surgery  1957    left knee arthotomy  . Abdominal aortic aneurysm repair  11/14/2010    AAA Repair    Dr Donnetta Hutching  . Transurethral resection of prostate  01/25/2011    TURP  . Incisional hernia repair  06/12/2011    Procedure: HERNIA  REPAIR INCISIONAL;  Surgeon: Earnstine Regal, MD;  Location: [facility];  Service: General;  Laterality: N/A;  Open Repair Ventral Incisional Hernia with Mesh  . Hernia repair    . Video assisted thoracoscopy  11/02/2011    Procedure: VIDEO ASSISTED THORACOSCOPY;  Surgeon: Melrose Nakayama, MD;  Location: [place];  Service: Thoracic;  Laterality: Right;  . Pleural biopsy  11/02/2011    Procedure: PLEURAL BIOPSY;  Surgeon: Melrose Nakayama, MD;  Location: [place];  Service: Thoracic;  Laterality: Right;  Parietal and visceral biopsies  . Chest tube insertion  11/02/2011    Procedure: INSERTION PLEURAL DRAINAGE CATHETER;  Surgeon: Melrose Nakayama, MD;  Location: [place];  Service: Thoracic;  Laterality: Right;  . Decortication  11/02/2011    Procedure: DECORTICATION;  Surgeon: Melrose Nakayama, MD;  Location: [place];  Service: Thoracic;  Laterality: Right;  . Pleural effusion drainage  11/02/2011    Procedure: DRAINAGE OF PLEURAL EFFUSION;  Surgeon: Melrose Nakayama, MD;  Location: [place];   Service: Thoracic;  Laterality: Right;  . Cholecystectomy  01/19/2012    Procedure: LAPAROSCOPIC CHOLECYSTECTOMY WITH INTRAOPERATIVE CHOLANGIOGRAM;  Surgeon: Zenovia Jarred, MD;  Location: [place];  Service: General;  Laterality: N/A;  laparoscopic cholecystectomy with intraoperative cholangiogram  . Ercp  01/22/2012    Procedure: ENDOSCOPIC RETROGRADE CHOLANGIOPANCREATOGRAPHY (ERCP);  Surgeon: Inda Castle, MD;  Location: [place];  Service: Endoscopy;  Laterality: N/A;  with stent placement  . Ercp  01/21/2012    Procedure: ENDOSCOPIC RETROGRADE CHOLANGIOPANCREATOGRAPHY (ERCP);  Surgeon: Milus Banister, MD;  Location: [place];  Service: Gastroenterology;  Laterality: N/A;  . Esophagogastroduodenoscopy  01/24/2012    Procedure: ESOPHAGOGASTRODUODENOSCOPY (EGD);  Surgeon: Inda Castle, MD;  Location: [place];  Service: Endoscopy;  Laterality: N/A;  remove pancr stent   . Endoscopic retrograde cholangiopancreatography (ercp) with propofol N/A 03/07/2012    Procedure: ENDOSCOPIC RETROGRADE CHOLANGIOPANCREATOGRAPHY (ERCP) WITH PROPOFOL;  Surgeon: Milus Banister, MD;  Location: [facility];  Service: Endoscopy;  Laterality: N/A;  remove stent  . Thyroidectomy N/A 04/04/2012    Procedure:  TOTAL THYROIDECTOMY WITH CENTRAL COMPARTMENT LYMPH NODE DISSECTION;  Surgeon: Earnstine Regal, MD;  Location: [facility];  Service: General;  Laterality: N/A;    Family History  Problem Relation Age of Onset  . Arthritis Mother   . Heart failure Mother   . Hypertension Mother   . Diabetes Mother   . Heart failure Daughter     deceased age 92  . Other Daughter     deceased age 32 MVA    Allergies  Allergen Reactions  . Lyrica [Pregabalin] Hives and Itching    Current Outpatient Prescriptions on File Prior to Visit  Medication Sig Dispense Refill  . albuterol (PROVENTIL HFA;VENTOLIN HFA) 108 (90 BASE) MCG/ACT inhaler Inhale 1 puff into the lungs every 6 (six) hours as needed for shortness of breath.        Marland Kitchen albuterol (PROVENTIL) (2.5 MG/3ML) 0.083% nebulizer solution Take 3 mLs (2.5 mg total) by nebulization every 6 (six) hours as needed for wheezing.  360 mL  6  . budesonide-formoterol (SYMBICORT) 160-4.5 MCG/ACT inhaler Inhale 2 puffs into the lungs 2 (two) times daily.  1 Inhaler  6  . calcium carbonate (OS-CAL - DOSED IN MG OF ELEMENTAL CALCIUM) 1250 MG tablet Take 2 tablets by mouth 2 (two) times daily.      . Cholecalciferol (VITAMIN D3) 1000 UNITS CAPS Take 1 capsule by mouth daily.      Marland Kitchen  folic acid (FOLVITE) 1 MG tablet Take 1 mg by mouth daily.      Marland Kitchen levothyroxine (SYNTHROID, LEVOTHROID) 150 MCG tablet Take 150 mcg by mouth daily before breakfast.      . lidocaine-prilocaine (EMLA) cream Apply 1 application topically as needed.  30 g  3  . lisinopril (PRINIVIL,ZESTRIL) 10 MG tablet TAKE ONE TABLET BY MOUTH TWICE DAILY  60 tablet  3  . LORazepam (ATIVAN) 0.5 MG tablet Take 1 tablet (0.5 mg total) by mouth every 6 (six) hours as needed (Nausea or vomiting).  30 tablet  0  . losartan (COZAAR) 25 MG tablet Take 25 mg by mouth daily.      . metoprolol tartrate (LOPRESSOR) 25 MG tablet TAKE ONE-HALF TABLET BY MOUTH TWICE DAILY  30 tablet  0  . ondansetron (ZOFRAN) 8 MG tablet Take 1 tablet (8 mg total) by mouth 2 (two) times daily. Take two times a day starting the day after chemo for 3 days. Then take two times a day as needed for nausea or vomiting.  30 tablet  1  . polyethylene glycol (MIRALAX) packet Take 17 g by mouth 2 (two) times daily.  14 each  3  . senna (SENOKOT) 8.6 MG TABS Take 2 tablets by mouth at bedtime.      . sertraline (ZOLOFT) 100 MG tablet TAKE ONE TABLET BY MOUTH ONCE DAILY BEFORE BREAKFAST  30 tablet  2  . SPIRIVA HANDIHALER 18 MCG inhalation capsule Place 18 mcg into inhaler and inhale every morning.       . traMADol (ULTRAM) 50 MG tablet Take 50 mg by mouth every 6 (six) hours as needed for moderate pain or severe pain.       No current facility-administered  medications on file prior to visit.    BP 100/58  Pulse 55  Temp(Src) 98.7 F (37.1 C) (Oral)  Resp 16  Ht 5\' 8"  (1.727 m)  SpO2 92%       Objective:   Physical Exam  Constitutional: He is oriented to person, place, and time. He appears well-developed and well-nourished. No distress.  HENT:  Head: Normocephalic and atraumatic.  Cardiovascular: Normal rate and regular rhythm.   No murmur heard. Pulmonary/Chest: Effort normal and breath sounds normal. No respiratory distress. He has no wheezes. He has no rales. He exhibits no tenderness.  Musculoskeletal:  RLE swelling extends up above the right knee.  Mild LLE swelling is noted.  + varicose veins noted right posterior calf. Some spider veins noted left calf.   Neurological: He is alert and oriented to person, place, and time.  Psychiatric: He has a normal mood and affect. His behavior is normal. Judgment and thought content normal.          Assessment & Plan:  Report given to charge nurse Coralyn Mark at the [facility] ED.

## 2013-04-21 NOTE — ED Notes (Signed)
Was sent from by [facility] with swelling and pain in both legs. Pt states right leg hurts worse than the left. Pain started 2 weeks ago.

## 2013-04-21 NOTE — Progress Notes (Signed)
Pre visit review using our clinic review tool, if applicable. No additional management support is needed unless otherwise documented below in the visit note. 

## 2013-04-21 NOTE — Plan of Care (Signed)
Called by [facility], Dr Tamera Punt  for Steve [name]. Briefly 80 year old male presenting with bilateral leg swelling with pain for last 2 weeks. 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 He was on xarelto but was admitted on 3/25 with GI bleed, xarelto was placed on hold (per DC summary for one to 2 weeks). IVC filter was placed.  Doppler ultrasound of the lower extremities bilaterally showed acute DVT in the right lower extremity/right femoral vein.   EDP discussed with the on-call oncologist, Dr. Julien Nordmann and recommended giving one dose of prophylactic Lovenox at this time and defer decision for permanent anticoagulation to patient's oncologist, Dr. Marin Olp in a.m.  Also noticed hemoglobin of 8.1 (dropped from 9.9 on 4/9)   Patient accepted to [facility], Team8   Steve [name] M.D. [facility] 04/21/2013, 11:50 PM  Pager: [phone]

## 2013-04-21 NOTE — ED Notes (Signed)
EDP made aware of family's concerns regarding wait time and U/S results.

## 2013-04-21 NOTE — ED Provider Notes (Signed)
CSN: [id number]     Arrival date & time 04/21/13  4403 History  This chart was scribed for Malvin Johns, MD by Celesta Gentile, ED Scribe. The patient was seen in room MH11/MH11. Patient's care was started at 7:29 PM.    Chief Complaint  Patient presents with  . Leg Swelling   The history is provided by the patient and a relative. No language interpreter was used.   HPI Comments: Steve [name] is a 80 y.o. male who presents to the Emergency Department complaining of bilateral leg swelling with associated pain that started about 2 weeks ago.  Pt saw Debbrah Alar this evening and she advised him to visit the ED to rule out a DVT.  Pt states he experiences SOB, which is his baseline.  He has a hx of recurrent DVT, factor V leiden def.  He was recently admitted for a GI bleed on 3/25 and taken off of his Xarelto.  He had an IVC filter placed during this hospitalization. Pt states the swelling is always worse in the right leg.  He states the right leg pain is worse than the left currently.  He is unable to ambulate.  Has also been more fatigued today.  Pt denies numbness and tingling in his legs bilaterally.  Pt denies chest pain.  Pt states he has had bruising to both his lower legs since being discharged from the hospital.   Pt denies any known injury to his right leg.    Pt reports he sleeps with 2.5L of oxygen at home.    Past Medical History  Diagnosis Date  . Prostate enlargement   . GERD (gastroesophageal reflux disease)   . AAA (abdominal aortic aneurysm)     stress test 09/14/10 [facility]  . Anxiety   . Recurrent upper respiratory infection (URI)     productive cough- white phlegm- no fever  . Hypertension     chest x ray 12/12 EP[facility]- repeated 06/06/11, EKG 11/12 [facility]  . DVT (deep venous thrombosis) 08/2011  . Pneumonia   . Bruises easily     takes Xarelto  . Depression   . Dysrhythmia     hx of atrial fib post op x 2 days   . Atrial fibrillation   . Mesothelioma   . Arthritis      knee and back  . Sleep apnea      12/12 sleep study,SEVERE per study  Dr Lamonte Sakai- states doesnt wear machine on regular basis- setting BiPAP 9/9  . H/O hiatal hernia   . COPD (chronic obstructive pulmonary disease)     patient uses oxygen 2L/[place] at nite   . Epistaxis 05/13/2012  . Olecranon bursitis of right elbow 06/28/2012   Past Surgical History  Procedure Laterality Date  . Knee surgery  1957    left knee arthotomy  . Abdominal aortic aneurysm repair  11/14/2010    AAA Repair    Dr Donnetta Hutching  . Transurethral resection of prostate  01/25/2011    TURP  . Incisional hernia repair  06/12/2011    Procedure: HERNIA REPAIR INCISIONAL;  Surgeon: Earnstine Regal, MD;  Location: [facility];  Service: General;  Laterality: N/A;  Open Repair Ventral Incisional Hernia with Mesh  . Hernia repair    . Video assisted thoracoscopy  11/02/2011    Procedure: VIDEO ASSISTED THORACOSCOPY;  Surgeon: Melrose Nakayama, MD;  Location: [place];  Service: Thoracic;  Laterality: Right;  . Pleural biopsy  11/02/2011    Procedure:  PLEURAL BIOPSY;  Surgeon: Melrose Nakayama, MD;  Location: [place];  Service: Thoracic;  Laterality: Right;  Parietal and visceral biopsies  . Chest tube insertion  11/02/2011    Procedure: INSERTION PLEURAL DRAINAGE CATHETER;  Surgeon: Melrose Nakayama, MD;  Location: [place];  Service: Thoracic;  Laterality: Right;  . Decortication  11/02/2011    Procedure: DECORTICATION;  Surgeon: Melrose Nakayama, MD;  Location: [place];  Service: Thoracic;  Laterality: Right;  . Pleural effusion drainage  11/02/2011    Procedure: DRAINAGE OF PLEURAL EFFUSION;  Surgeon: Melrose Nakayama, MD;  Location: [place];  Service: Thoracic;  Laterality: Right;  . Cholecystectomy  01/19/2012    Procedure: LAPAROSCOPIC CHOLECYSTECTOMY WITH INTRAOPERATIVE CHOLANGIOGRAM;  Surgeon: Zenovia Jarred, MD;  Location: [place];  Service: General;  Laterality: N/A;  laparoscopic cholecystectomy with intraoperative cholangiogram   . Ercp  01/22/2012    Procedure: ENDOSCOPIC RETROGRADE CHOLANGIOPANCREATOGRAPHY (ERCP);  Surgeon: Inda Castle, MD;  Location: [place];  Service: Endoscopy;  Laterality: N/A;  with stent placement  . Ercp  01/21/2012    Procedure: ENDOSCOPIC RETROGRADE CHOLANGIOPANCREATOGRAPHY (ERCP);  Surgeon: Milus Banister, MD;  Location: [place];  Service: Gastroenterology;  Laterality: N/A;  . Esophagogastroduodenoscopy  01/24/2012    Procedure: ESOPHAGOGASTRODUODENOSCOPY (EGD);  Surgeon: Inda Castle, MD;  Location: [place];  Service: Endoscopy;  Laterality: N/A;  remove pancr stent   . Endoscopic retrograde cholangiopancreatography (ercp) with propofol N/A 03/07/2012    Procedure: ENDOSCOPIC RETROGRADE CHOLANGIOPANCREATOGRAPHY (ERCP) WITH PROPOFOL;  Surgeon: Milus Banister, MD;  Location: [facility];  Service: Endoscopy;  Laterality: N/A;  remove stent  . Thyroidectomy N/A 04/04/2012    Procedure:  TOTAL THYROIDECTOMY WITH CENTRAL COMPARTMENT LYMPH NODE DISSECTION;  Surgeon: Earnstine Regal, MD;  Location: [facility];  Service: General;  Laterality: N/A;   Family History  Problem Relation Age of Onset  . Arthritis Mother   . Heart failure Mother   . Hypertension Mother   . Diabetes Mother   . Heart failure Daughter     deceased age 15  . Other Daughter     deceased age 16 MVA   History  Substance Use Topics  . Smoking status: Current Every Day Smoker -- 1.50 packs/day for 60 years    Types: Cigarettes    Start date: 02/12/1954  . Smokeless tobacco: Never Used     Comment: was on E cigarettes now back on regular cigarettes  . Alcohol Use: No    Review of Systems  Constitutional: Negative for fever, chills, diaphoresis and fatigue.  HENT: Negative for congestion, rhinorrhea and sneezing.   Eyes: Negative.   Respiratory: Negative for cough, chest tightness and shortness of breath.   Cardiovascular: Positive for leg swelling. Negative for chest pain.  Gastrointestinal: Negative for nausea,  vomiting, abdominal pain, diarrhea and blood in stool.  Genitourinary: Negative for frequency, hematuria, flank pain and difficulty urinating.  Musculoskeletal: Negative for arthralgias and back pain.  Skin: Negative for color change and rash.       Ecchymosis to legs bilaterally.  Neurological: Negative for dizziness, speech difficulty, weakness, numbness and headaches.    Allergies  Lyrica  Home Medications   Current Outpatient Rx  Name  Route  Sig  Dispense  Refill  . albuterol (PROVENTIL HFA;VENTOLIN HFA) 108 (90 BASE) MCG/ACT inhaler   Inhalation   Inhale 1 puff into the lungs every 6 (six) hours as needed for shortness of breath.         Marland Kitchen  albuterol (PROVENTIL) (2.5 MG/3ML) 0.083% nebulizer solution   Nebulization   Take 3 mLs (2.5 mg total) by nebulization every 6 (six) hours as needed for wheezing.   360 mL   6   . budesonide-formoterol (SYMBICORT) 160-4.5 MCG/ACT inhaler   Inhalation   Inhale 2 puffs into the lungs 2 (two) times daily.   1 Inhaler   6   . calcium carbonate (OS-CAL - DOSED IN MG OF ELEMENTAL CALCIUM) 1250 MG tablet   Oral   Take 2 tablets by mouth 2 (two) times daily.         . Cholecalciferol (VITAMIN D3) 1000 UNITS CAPS   Oral   Take 1 capsule by mouth daily.         . folic acid (FOLVITE) 1 MG tablet   Oral   Take 1 mg by mouth daily.         Marland Kitchen levothyroxine (SYNTHROID, LEVOTHROID) 150 MCG tablet   Oral   Take 150 mcg by mouth daily before breakfast.         . lidocaine-prilocaine (EMLA) cream   Topical   Apply 1 application topically as needed.   30 g   3   . lisinopril (PRINIVIL,ZESTRIL) 10 MG tablet      TAKE ONE TABLET BY MOUTH TWICE DAILY   60 tablet   3   . LORazepam (ATIVAN) 0.5 MG tablet   Oral   Take 1 tablet (0.5 mg total) by mouth every 6 (six) hours as needed (Nausea or vomiting).   30 tablet   0   . losartan (COZAAR) 25 MG tablet   Oral   Take 25 mg by mouth daily.         . metoprolol tartrate  (LOPRESSOR) 25 MG tablet      TAKE ONE-HALF TABLET BY MOUTH TWICE DAILY   30 tablet   0   . ondansetron (ZOFRAN) 8 MG tablet   Oral   Take 1 tablet (8 mg total) by mouth 2 (two) times daily. Take two times a day starting the day after chemo for 3 days. Then take two times a day as needed for nausea or vomiting.   30 tablet   1   . polyethylene glycol (MIRALAX) packet   Oral   Take 17 g by mouth 2 (two) times daily.   14 each   3   . senna (SENOKOT) 8.6 MG TABS   Oral   Take 2 tablets by mouth at bedtime.         . sertraline (ZOLOFT) 100 MG tablet      TAKE ONE TABLET BY MOUTH ONCE DAILY BEFORE BREAKFAST   30 tablet   2   . SPIRIVA HANDIHALER 18 MCG inhalation capsule   Inhalation   Place 18 mcg into inhaler and inhale every morning.          . traMADol (ULTRAM) 50 MG tablet   Oral   Take 50 mg by mouth every 6 (six) hours as needed for moderate pain or severe pain.          Triage Vitals: BP 89/41  Pulse 56  Temp(Src) 98.5 F (36.9 C) (Oral)  Resp 24  Ht 5\' 8"  (1.727 m)  Wt 195 lb (88.451 kg)  BMI 29.66 kg/m2  SpO2 91%  Physical Exam  Constitutional: He is oriented to person, place, and time. He appears well-developed and well-nourished. No distress.  HENT:  Head: Normocephalic and atraumatic.  Eyes: Conjunctivae and EOM  are normal. Pupils are equal, round, and reactive to light. Right eye exhibits no discharge. Left eye exhibits no discharge.  Neck: Normal range of motion. Neck supple.  Cardiovascular: Normal rate, regular rhythm and normal heart sounds.  Exam reveals no gallop and no friction rub.   No murmur heard. Pulmonary/Chest: Effort normal and breath sounds normal. No respiratory distress. He has no wheezes. He has no rales. He exhibits no tenderness.  Abdominal: Soft. Bowel sounds are normal. There is no tenderness. There is no rebound and no guarding.  Musculoskeletal: Normal range of motion. He exhibits no edema.  There is bilateral lower  extremity pitting edema.  Right greater than left.  There is ecchymosis to calves of both legs.  There is tenderness along the calves bilaterally.    Lymphadenopathy:    He has no cervical adenopathy.  Neurological: He is alert and oriented to person, place, and time.  Skin: Skin is warm and dry. No rash noted. He is not diaphoretic.  Psychiatric: He has a normal mood and affect. His behavior is normal.    ED Course  Procedures (including critical care time) DIAGNOSTIC STUDIES: Oxygen Saturation is 91% on [place], adequate by my interpretation.    COORDINATION OF CARE: 7:53 PM-Will order Venous imaging lower extremities, CBC with diff, and basic metabolic panel.  Patient informed of current plan of treatment and evaluation and agrees with plan.    Labs Review Labs Reviewed  CBC WITH DIFFERENTIAL - Abnormal; Notable for the following:    RBC 2.55 (*)    Hemoglobin 8.1 (*)    HCT 24.8 (*)    RDW 17.7 (*)    Platelets 105 (*)    Neutrophils Relative % 84 (*)    Monocytes Relative 2 (*)    All other components within normal limits  BASIC METABOLIC PANEL - Abnormal; Notable for the following:    Glucose, Bld 128 (*)    BUN 24 (*)    GFR calc non Af Amer 57 (*)    GFR calc Af Amer 66 (*)    All other components within normal limits  OCCULT BLOOD X 1 CARD TO LAB, STOOL   Imaging Review US Venous Img Lower Unilateral Left  04/21/2013   CLINICAL DATA:  Bilateral lower extremity swelling  EXAM: LOWER EXTREMITY VENOUS DOPPLER ULTRASOUND  TECHNIQUE: Gray-scale sonography with graded compression, as well as color Doppler and duplex ultrasound were performed to evaluate the lower extremity deep venous systems from the level of the common femoral vein and including the common femoral, femoral, profunda femoral, popliteal and calf veins including the posterior tibial, peroneal and gastrocnemius veins when visible. The superficial great saphenous vein was also interrogated. Spectral Doppler was  utilized to evaluate flow at rest and with distal augmentation maneuvers in the common femoral, femoral and popliteal veins.  COMPARISON:  None.  FINDINGS: Common Femoral Vein: No evidence of thrombus. Normal compressibility, respiratory phasicity and response to augmentation.  Saphenofemoral Junction: No evidence of thrombus. Normal compressibility and flow on color Doppler imaging.  Profunda Femoral Vein: No evidence of thrombus. Normal compressibility and flow on color Doppler imaging.  Femoral Vein: No evidence of thrombus. Normal compressibility, respiratory phasicity and response to augmentation.  Popliteal Vein: No evidence of thrombus. Normal compressibility, respiratory phasicity and response to augmentation.  Calf Veins: No evidence of thrombus. Normal compressibility and flow on color Doppler imaging.  Superficial Great Saphenous Vein: No evidence of thrombus. Normal compressibility and flow on color Doppler imaging.  Venous Reflux:  None.  Other Findings:  None.  IMPRESSION: No deep venous thrombosis of the left lower extremity.   Electronically Signed   By: Kathreen Devoid   On: 04/21/2013 21:44   US Venous Img Lower Unilateral Right  04/21/2013   CLINICAL DATA:  Bilateral lower extremity swelling  EXAM: RIGHT LOWER EXTREMITY VENOUS DOPPLER ULTRASOUND  TECHNIQUE: Gray-scale sonography with graded compression, as well as color Doppler and duplex ultrasound were performed to evaluate the lower extremity deep venous systems from the level of the common femoral vein and including the common femoral, femoral, profunda femoral, popliteal and calf veins including the posterior tibial, peroneal and gastrocnemius veins when visible. The superficial great saphenous vein was also interrogated. Spectral Doppler was utilized to evaluate flow at rest and with distal augmentation maneuvers in the common femoral, femoral and popliteal veins.  COMPARISON:  None.  FINDINGS: Common Femoral Vein: No evidence of thrombus.  Normal compressibility, respiratory phasicity and response to augmentation.  Saphenofemoral Junction: No evidence of thrombus. Normal compressibility and flow on color Doppler imaging.  Profunda Femoral Vein: No evidence of thrombus. Normal compressibility and flow on color Doppler imaging.  Femoral Vein: The superficial femoral vein is noncompressible with echogenic material within the vein and relative lack of Doppler flow proximally concerning for occlusive deep venous thrombosis.  Popliteal Vein: The right popliteal vein is noncompressible with a small amount of color flow noted concerning for nonocclusive deep venous thrombosis.  Calf Veins: Not visualized.  Superficial Great Saphenous Vein: There is mural thickening of the greater saphenous vein without echogenic thrombus within the lumen. The appearance is suggestive of chronic thrombosis with recanalization.  Venous Reflux:  None.  Other Findings: There is a 4.3 x 2.7 x 6.8 cm irregular hypoechoic mass in the region of the mid right calf which may reflect a hematoma. There is no internal Doppler flow.  IMPRESSION: 1. Occlusive deep venous thrombosis of the right femoral vein. 2. Nonocclusive deep venous thrombosis of the right popliteal vein. 3. There is a 4.3 x 2.7 x 6.8 cm irregular hypoechoic mass without internal Doppler flow, in the region of the mid right calf which may reflect a hematoma.   Electronically Signed   By: Kathreen Devoid   On: 04/21/2013 21:48     EKG Interpretation None      MDM   Final diagnoses:  Anemia  DVT (deep vein thrombosis) in pregnancy   Pt presents with increased pain/swelling to right leg.  Has IVC filter in place.  +DVT to RLE.  Pt unable to ambulate due to pain in leg.  Also has drop in hgb from 9.9 to 8.1 with increased fatigue.  Wife states that he usually gets a blood transfusion when it is around 8.  Will plan admission to [facility] for blood tranfusion.  Stool neg for blood today.  Discussed with Dr. Neil Crouch  whether or not to start pt back on blood thinner.  His feeling was to give a prophylactic dose of lovenox tonight and have Dr. Marin Olp decide in the am.  Will dicuss admission with hospitalist.  Currently, he has not increased SOB or CP that would be concerning for PE.  His sat is 91-92% on [place], 98% of oxygen and his home o2 level.  I personally performed the services described in this documentation, which was scribed in my presence.  The recorded information has been reviewed and considered.      Malvin Johns, MD 04/21/13 [phone]

## 2013-04-21 NOTE — ED Notes (Signed)
Pt updated on care. Will get admitted

## 2013-04-21 NOTE — Patient Instructions (Signed)
Please proceed directly to the ED on the first floor.

## 2013-04-21 NOTE — ED Notes (Signed)
MD at bedside. 

## 2013-04-21 NOTE — ED Notes (Signed)
Lung cancer patient. Hx of DVT. Present to ED today for bilateral leg edema x 2 weeks, 2+ pitting. Reports leg bilaterally. Discontinued his blood thinner just about 2 weeks ago as well. Pt reports SOB but has hx of COPD and lung CA and is always short of breath

## 2013-04-22 DIAGNOSIS — I82411 Acute embolism and thrombosis of right femoral vein: Secondary | ICD-10-CM | POA: Diagnosis present

## 2013-04-22 DIAGNOSIS — I824Y9 Acute embolism and thrombosis of unspecified deep veins of unspecified proximal lower extremity: Principal | ICD-10-CM

## 2013-04-22 DIAGNOSIS — I10 Essential (primary) hypertension: Secondary | ICD-10-CM

## 2013-04-22 DIAGNOSIS — I82431 Acute embolism and thrombosis of right popliteal vein: Secondary | ICD-10-CM | POA: Diagnosis present

## 2013-04-22 LAB — CBC
HEMATOCRIT: 24.4 % — AB (ref 39.0–52.0)
Hemoglobin: 8.3 g/dL — ABNORMAL LOW (ref 13.0–17.0)
MCH: 32.2 pg (ref 26.0–34.0)
MCHC: 34 g/dL (ref 30.0–36.0)
MCV: 94.6 fL (ref 78.0–100.0)
PLATELETS: 101 10*3/uL — AB (ref 150–400)
RBC: 2.58 MIL/uL — ABNORMAL LOW (ref 4.22–5.81)
RDW: 17.9 % — AB (ref 11.5–15.5)
WBC: 5.6 10*3/uL (ref 4.0–10.5)

## 2013-04-22 LAB — BASIC METABOLIC PANEL
BUN: 24 mg/dL — ABNORMAL HIGH (ref 6–23)
CO2: 30 mEq/L (ref 19–32)
CREATININE: 1.06 mg/dL (ref 0.50–1.35)
Calcium: 8 mg/dL — ABNORMAL LOW (ref 8.4–10.5)
Chloride: 102 mEq/L (ref 96–112)
GFR, EST AFRICAN AMERICAN: 76 mL/min — AB (ref >90)
GFR, EST NON AFRICAN AMERICAN: 66 mL/min — AB (ref >90)
Glucose, Bld: 111 mg/dL — ABNORMAL HIGH (ref 70–99)
POTASSIUM: 4 meq/L (ref 3.7–5.3)
Sodium: 141 mEq/L (ref 137–147)

## 2013-04-22 MED ORDER — POLYETHYLENE GLYCOL 3350 17 G PO PACK
17.0000 g | PACK | Freq: Two times a day (BID) | ORAL | Status: DC
  Administered 2013-04-22 – 2013-04-23 (×5): 17 g via ORAL
  Filled 2013-04-22 (×7): qty 1

## 2013-04-22 MED ORDER — SERTRALINE HCL 100 MG PO TABS
100.0000 mg | ORAL_TABLET | Freq: Every day | ORAL | Status: DC
  Administered 2013-04-22 – 2013-04-24 (×3): 100 mg via ORAL
  Filled 2013-04-22 (×3): qty 1

## 2013-04-22 MED ORDER — BUDESONIDE-FORMOTEROL FUMARATE 160-4.5 MCG/ACT IN AERO
2.0000 | INHALATION_SPRAY | Freq: Two times a day (BID) | RESPIRATORY_TRACT | Status: DC
  Administered 2013-04-22 – 2013-04-24 (×6): 2 via RESPIRATORY_TRACT
  Filled 2013-04-22: qty 6

## 2013-04-22 MED ORDER — ALBUTEROL SULFATE (2.5 MG/3ML) 0.083% IN NEBU: 2.5000 mg | INHALATION_SOLUTION | Freq: Four times a day (QID) | RESPIRATORY_TRACT | Status: DC | PRN

## 2013-04-22 MED ORDER — LORAZEPAM 0.5 MG PO TABS: 0.5000 mg | ORAL_TABLET | Freq: Four times a day (QID) | ORAL | Status: DC | PRN

## 2013-04-22 MED ORDER — CALCIUM CARBONATE 1250 (500 CA) MG PO TABS
2.0000 | ORAL_TABLET | Freq: Two times a day (BID) | ORAL | Status: DC
  Administered 2013-04-22 – 2013-04-24 (×6): 1000 mg via ORAL
  Filled 2013-04-22 (×7): qty 2

## 2013-04-22 MED ORDER — ALBUTEROL SULFATE HFA 108 (90 BASE) MCG/ACT IN AERS: 1.0000 | INHALATION_SPRAY | Freq: Four times a day (QID) | RESPIRATORY_TRACT | Status: DC | PRN

## 2013-04-22 MED ORDER — ONDANSETRON HCL 8 MG PO TABS
8.0000 mg | ORAL_TABLET | Freq: Two times a day (BID) | ORAL | Status: DC
  Administered 2013-04-22 – 2013-04-24 (×6): 8 mg via ORAL
  Filled 2013-04-22 (×7): qty 1

## 2013-04-22 MED ORDER — TRAMADOL HCL 50 MG PO TABS: 50.0000 mg | ORAL_TABLET | Freq: Four times a day (QID) | ORAL | Status: DC | PRN

## 2013-04-22 MED ORDER — METOPROLOL TARTRATE 12.5 MG HALF TABLET
12.5000 mg | ORAL_TABLET | Freq: Two times a day (BID) | ORAL | Status: DC
  Administered 2013-04-22 – 2013-04-24 (×6): 12.5 mg via ORAL
  Filled 2013-04-22 (×7): qty 1

## 2013-04-22 MED ORDER — FOLIC ACID 1 MG PO TABS
1.0000 mg | ORAL_TABLET | Freq: Every day | ORAL | Status: DC
  Administered 2013-04-22 – 2013-04-24 (×3): 1 mg via ORAL
  Filled 2013-04-22 (×3): qty 1

## 2013-04-22 MED ORDER — ENOXAPARIN SODIUM 100 MG/ML ~~LOC~~ SOLN
90.0000 mg | Freq: Two times a day (BID) | SUBCUTANEOUS | Status: DC
  Administered 2013-04-22 – 2013-04-23 (×2): 90 mg via SUBCUTANEOUS
  Filled 2013-04-22 (×4): qty 1

## 2013-04-22 MED ORDER — SENNA 8.6 MG PO TABS
2.0000 | ORAL_TABLET | Freq: Every day | ORAL | Status: DC
  Administered 2013-04-22 – 2013-04-23 (×2): 17.2 mg via ORAL
  Filled 2013-04-22 (×2): qty 2

## 2013-04-22 MED ORDER — LOSARTAN POTASSIUM 25 MG PO TABS
25.0000 mg | ORAL_TABLET | Freq: Every day | ORAL | Status: DC
  Administered 2013-04-22 – 2013-04-24 (×3): 25 mg via ORAL
  Filled 2013-04-22 (×3): qty 1

## 2013-04-22 MED ORDER — LEVOTHYROXINE SODIUM 150 MCG PO TABS
150.0000 ug | ORAL_TABLET | Freq: Every day | ORAL | Status: DC
  Administered 2013-04-22 – 2013-04-24 (×3): 150 ug via ORAL
  Filled 2013-04-22 (×4): qty 1

## 2013-04-22 MED ORDER — TIOTROPIUM BROMIDE MONOHYDRATE 18 MCG IN CAPS
18.0000 ug | ORAL_CAPSULE | Freq: Every morning | RESPIRATORY_TRACT | Status: DC
  Administered 2013-04-22 – 2013-04-24 (×3): 18 ug via RESPIRATORY_TRACT
  Filled 2013-04-22: qty 5

## 2013-04-22 NOTE — Progress Notes (Addendum)
[facility] PROGRESS NOTE  Steve [name] JQZ:[id number] DOB: 1933/03/12 DOA: 04/21/2013 PCP: Penni Homans, MD  Brief narrative: 80 y.o. Male with mesothelioma, presenting to [facility] emergency department (transfer for [facility]) with main concern of progressively worsening lower extremity pain and swelling, started 2 weeks ago and with no specific alleviating factors. Patient describes pain as constant, throbbing, 7/10 in severity, radiating from the groin area to bilateral feet, worse with walking. Patient reports this appears to have started right after he was taken off Xarelto 2 weeks prior to this admission. He explains he was hospitalized from March 25th to March 30 for rectal bleeding which was determined to be associated with constipation and straining and he was taken off Xarelto. His rectal bleeding has resolved. He denies chest pain or shortness of breath, no specific abdominal or urinary concerns. In emergency department, right lower extremity venous Doppler positive for occlusive DVT of the right femoral vein and nonocclusive DVT in right popliteal vein. Patient given one dose of therapeutic Lovenox. [facility] asked to admit for further evaluation  Principal Problem:   Acute deep vein thrombosis (DVT) of femoral vein of right lower extremity -  started on Lovenox, hemoglobin appears to be stable since admission - Will followup on Dr. Loreli Slot recommendations - FOBT negative Active Problems:   Hypertension - Reasonable inpatient control - Continue losartan and metoprolol     Mesothelioma (pleural) - Appreciate oncology input   Anemia of chronic disease - Recent admission for rectal bleeding that was associated with constipation and straining - FOBT negative in the hospital, patient denies noticing any blood in stool   Hypothyroidism - continue synthroid   Consultants:   Oncology  Procedures/Studies: US Venous Img Lower Unilateral Left  04/21/2013   No deep venous thrombosis of the left lower extremity.     US Venous Img Lower Unilateral Right   04/21/2013   Occlusive DVT of the right femoral vein. Nonocclusive DVT of the right popliteal vein. There is a 4.3 x 2.7 x 6.8 cm irregular hypoechoic mass without internal Doppler flow, in the region of the mid right calf which may reflect a hematoma.    Antibiotics:  None   Code Status: Full Family Communication: Pt at bedside Disposition Plan: Home when medically stable  HPI/Subjective: No events overnight.   Objective: Filed Vitals:   04/22/13 0119 04/22/13 0302 04/22/13 0605 04/22/13 1022  BP: 120/70 115/47 122/55 124/46  Pulse: 61 66 59 62  Temp: 97.9 F (36.6 C) 97.9 F (36.6 C) 97.3 F (36.3 C)   TempSrc: Oral Oral Oral   Resp: 18 18 20    Height:  5\' 8"  (1.727 m)    Weight:  89.4 kg (197 lb 1.5 oz)    SpO2: 95% 98% 99%     Intake/Output Summary (Last 24 hours) at 04/22/13 1123 Last data filed at 04/22/13 0800  Gross per 24 hour  Intake    240 ml  Output    375 ml  Net   -135 ml    Exam:   General:  Pt is alert, follows commands appropriately, not in acute distress  Cardiovascular: Regular rate and rhythm, S1/S2, no murmurs, no rubs, no gallops  Respiratory: Clear to auscultation bilaterally, no wheezing, no crackles, no rhonchi  Abdomen: Soft, non tender, non distended, bowel sounds present, no guarding  Extremities: No edema, pulses DP and PT palpable bilaterally  Neuro: Grossly nonfocal  Data Reviewed: Basic Metabolic Panel:  Recent Labs  Lab 04/21/13 1942 04/22/13 0455  NA 142 141  K 4.0 4.0  CL 102 102  CO2 28 30  GLUCOSE 128* 111*  BUN 24* 24*  CREATININE 1.20 1.06  CALCIUM 8.4 8.0*   CBC:  Recent Labs Lab 04/17/13 0922 04/21/13 1942 04/22/13 0455  WBC 8.8 6.9 5.6  NEUTROABS 6.4 5.8  --   HGB 9.9* 8.1* 8.3*  HCT 29.9* 24.8* 24.4*  MCV 96 97.3 94.6  PLT 85* 105* 101*    Scheduled Meds: . budesonide-formoterol  2 puff  Inhalation BID  . calcium carbonate  2 tablet Oral BID  . folic acid  1 mg Oral Daily  . levothyroxine  150 mcg Oral QAC breakfast  . losartan  25 mg Oral Daily  . metoprolol tartrate  12.5 mg Oral BID  . nicotine  21 mg Transdermal Daily  . ondansetron  8 mg Oral BID  . polyethylene glycol  17 g Oral BID  . senna  2 tablet Oral QHS  . sertraline  100 mg Oral Daily  . tiotropium  18 mcg Inhalation q morning - 10a   Continuous Infusions:    Theodis Blaze, MD  [facility] Pager [phone]  If 7PM-7AM, please contact night-coverage [email or web address] Password [email or web address] 04/22/2013, 11:23 AM   LOS: 1 day

## 2013-04-22 NOTE — ED Notes (Signed)
Report given to [facility] and Estill Bamberg at [facility]

## 2013-04-22 NOTE — H&P (Signed)
[facility] History and Physical  Steve [name] QBH:[id number] DOB: [birth year] DOA: 04/21/2013  Referring physician: EDP PCP: Penni Homans, MD   Chief Complaint: Leg Swelling   HPI: Steve [name] is a 80 y.o. male who presents to the ED at [facility] with c/o bilateral leg swelling and pain.  Pain is worse with walking, and has been worsening in course until it has reached 9/10 on the R side, 5/10 on the left side.  Symptoms onset about 2 weeks ago after he was taken off of Xarelto during a hospital stay from 3/25-3/30 for rectal bleeding associated with constipation and straining.  His rectal bleeding has since resolved (guiac negative in ED today).  IVF was placed during that hospitalization due to concern for his multiple reasons to be hypercoaguable (active cancer, lupus anticoagulant positive, and heterozygote factor V leiden mutation), he has an extensive thrombus history in the past.  Unfortunately, as Mr. Reisz, feared, doppler ultrasound in the ED did indeed reveal a R sided acute DVT.   Review of Systems: Systems reviewed.  As above, otherwise negative  Past Medical History  Diagnosis Date  . Prostate enlargement   . GERD (gastroesophageal reflux disease)   . AAA (abdominal aortic aneurysm)     stress test 09/14/10 EPIC  . Anxiety   . Recurrent upper respiratory infection (URI)     productive cough- white phlegm- no fever  . Hypertension     chest x ray 12/12 EPIC- repeated 06/06/11, EKG 11/12 EPIC  . DVT (deep venous thrombosis) 08/2011  . Pneumonia   . Bruises easily     takes Xarelto  . Depression   . Dysrhythmia     hx of atrial fib post op x 2 days   . Atrial fibrillation   . Mesothelioma   . Arthritis     knee and back  . Sleep apnea      12/12 sleep study,SEVERE per study  Dr Lamonte Sakai- states doesnt wear machine on regular basis- setting BiPAP 9/9  . H/O hiatal hernia   . COPD (chronic obstructive pulmonary disease)     patient uses oxygen 2L/[place] at nite   .  Epistaxis 05/13/2012  . Olecranon bursitis of right elbow 06/28/2012   Past Surgical History  Procedure Laterality Date  . Knee surgery  1957    left knee arthotomy  . Abdominal aortic aneurysm repair  11/14/2010    AAA Repair    Dr Donnetta Hutching  . Transurethral resection of prostate  01/25/2011    TURP  . Incisional hernia repair  06/12/2011    Procedure: HERNIA REPAIR INCISIONAL;  Surgeon: Earnstine Regal, MD;  Location: [facility];  Service: General;  Laterality: N/A;  Open Repair Ventral Incisional Hernia with Mesh  . Hernia repair    . Video assisted thoracoscopy  11/02/2011    Procedure: VIDEO ASSISTED THORACOSCOPY;  Surgeon: Melrose Nakayama, MD;  Location: [place];  Service: Thoracic;  Laterality: Right;  . Pleural biopsy  11/02/2011    Procedure: PLEURAL BIOPSY;  Surgeon: Melrose Nakayama, MD;  Location: [place];  Service: Thoracic;  Laterality: Right;  Parietal and visceral biopsies  . Chest tube insertion  11/02/2011    Procedure: INSERTION PLEURAL DRAINAGE CATHETER;  Surgeon: Melrose Nakayama, MD;  Location: [place];  Service: Thoracic;  Laterality: Right;  . Decortication  11/02/2011    Procedure: DECORTICATION;  Surgeon: Melrose Nakayama, MD;  Location: [place];  Service: Thoracic;  Laterality: Right;  .  Pleural effusion drainage  11/02/2011    Procedure: DRAINAGE OF PLEURAL EFFUSION;  Surgeon: Melrose Nakayama, MD;  Location: [place];  Service: Thoracic;  Laterality: Right;  . Cholecystectomy  01/19/2012    Procedure: LAPAROSCOPIC CHOLECYSTECTOMY WITH INTRAOPERATIVE CHOLANGIOGRAM;  Surgeon: Zenovia Jarred, MD;  Location: [place];  Service: General;  Laterality: N/A;  laparoscopic cholecystectomy with intraoperative cholangiogram  . Ercp  01/22/2012    Procedure: ENDOSCOPIC RETROGRADE CHOLANGIOPANCREATOGRAPHY (ERCP);  Surgeon: Inda Castle, MD;  Location: [place];  Service: Endoscopy;  Laterality: N/A;  with stent placement  . Ercp  01/21/2012    Procedure: ENDOSCOPIC RETROGRADE  CHOLANGIOPANCREATOGRAPHY (ERCP);  Surgeon: Milus Banister, MD;  Location: [place];  Service: Gastroenterology;  Laterality: N/A;  . Esophagogastroduodenoscopy  01/24/2012    Procedure: ESOPHAGOGASTRODUODENOSCOPY (EGD);  Surgeon: Inda Castle, MD;  Location: [place];  Service: Endoscopy;  Laterality: N/A;  remove pancr stent   . Endoscopic retrograde cholangiopancreatography (ercp) with propofol N/A 03/07/2012    Procedure: ENDOSCOPIC RETROGRADE CHOLANGIOPANCREATOGRAPHY (ERCP) WITH PROPOFOL;  Surgeon: Milus Banister, MD;  Location: [facility];  Service: Endoscopy;  Laterality: N/A;  remove stent  . Thyroidectomy N/A 04/04/2012    Procedure:  TOTAL THYROIDECTOMY WITH CENTRAL COMPARTMENT LYMPH NODE DISSECTION;  Surgeon: Earnstine Regal, MD;  Location: [facility];  Service: General;  Laterality: N/A;   Social History:  reports that he has been smoking Cigarettes.  He started smoking about 59 years ago. He has a 90 pack-year smoking history. He has never used smokeless tobacco. He reports that he does not drink alcohol or use illicit drugs.  Allergies  Allergen Reactions  . Lyrica [Pregabalin] Hives and Itching    Family History  Problem Relation Age of Onset  . Arthritis Mother   . Heart failure Mother   . Hypertension Mother   . Diabetes Mother   . Heart failure Daughter     deceased age 20  . Other Daughter     deceased age 75 MVA     Prior to Admission medications   Medication Sig Start Date End Date Taking? Authorizing Provider  albuterol (PROVENTIL HFA;VENTOLIN HFA) 108 (90 BASE) MCG/ACT inhaler Inhale 1 puff into the lungs every 6 (six) hours as needed for shortness of breath.    Historical Provider, MD  albuterol (PROVENTIL) (2.5 MG/3ML) 0.083% nebulizer solution Take 3 mLs (2.5 mg total) by nebulization every 6 (six) hours as needed for wheezing. 03/24/13   Collene Gobble, MD  budesonide-formoterol (SYMBICORT) 160-4.5 MCG/ACT inhaler Inhale 2 puffs into the lungs 2 (two) times  daily. 03/24/13   Collene Gobble, MD  calcium carbonate (OS-CAL - DOSED IN MG OF ELEMENTAL CALCIUM) 1250 MG tablet Take 2 tablets by mouth 2 (two) times daily. 04/05/12   Earnstine Regal, MD  Cholecalciferol (VITAMIN D3) 1000 UNITS CAPS Take 1 capsule by mouth daily.    Historical Provider, MD  folic acid (FOLVITE) 1 MG tablet Take 1 mg by mouth daily. 01/23/13   Volanda Napoleon, MD  levothyroxine (SYNTHROID, LEVOTHROID) 150 MCG tablet Take 150 mcg by mouth daily before breakfast.    Historical Provider, MD  lidocaine-prilocaine (EMLA) cream Apply 1 application topically as needed. 01/21/13   Volanda Napoleon, MD  lisinopril (PRINIVIL,ZESTRIL) 10 MG tablet TAKE ONE TABLET BY MOUTH TWICE DAILY    Mosie Lukes, MD  LORazepam (ATIVAN) 0.5 MG tablet Take 1 tablet (0.5 mg total) by mouth every 6 (six) hours as needed (Nausea  or vomiting). 01/23/13   Volanda Napoleon, MD  losartan (COZAAR) 25 MG tablet Take 25 mg by mouth daily.    Historical Provider, MD  metoprolol tartrate (LOPRESSOR) 25 MG tablet TAKE ONE-HALF TABLET BY MOUTH TWICE DAILY    Mosie Lukes, MD  ondansetron (ZOFRAN) 8 MG tablet Take 1 tablet (8 mg total) by mouth 2 (two) times daily. Take two times a day starting the day after chemo for 3 days. Then take two times a day as needed for nausea or vomiting. 01/23/13   Volanda Napoleon, MD  polyethylene glycol [facility]) packet Take 17 g by mouth 2 (two) times daily. 04/07/13   Costin Karlyne Greenspan, MD  senna (SENOKOT) 8.6 MG TABS Take 2 tablets by mouth at bedtime.    Historical Provider, MD  sertraline (ZOLOFT) 100 MG tablet TAKE ONE TABLET BY MOUTH ONCE DAILY BEFORE BREAKFAST 03/01/13   Mosie Lukes, MD  SPIRIVA HANDIHALER 18 MCG inhalation capsule Place 18 mcg into inhaler and inhale every morning.  02/17/13   Historical Provider, MD  traMADol (ULTRAM) 50 MG tablet Take 50 mg by mouth every 6 (six) hours as needed for moderate pain or severe pain. 10/03/12   Volanda Napoleon, MD   Physical Exam: Filed  Vitals:   04/22/13 0302  BP: 115/47  Pulse: 66  Temp: 97.9 F (36.6 C)  Resp: 18    BP 115/47  Pulse 66  Temp(Src) 97.9 F (36.6 C) (Oral)  Resp 18  Ht 5\' 8"  (1.727 m)  Wt 89.4 kg (197 lb 1.5 oz)  BMI 29.97 kg/m2  SpO2 98%  General Appearance:    Alert, oriented, no distress, appears stated age  Head:    Normocephalic, atraumatic  Eyes:    PERRL, EOMI, sclera non-icteric        Nose:   Nares without drainage or epistaxis. Mucosa, turbinates normal  Throat:   Moist mucous membranes. Oropharynx without erythema or exudate.  Neck:   Supple. No carotid bruits.  No thyromegaly.  No lymphadenopathy.   Back:     No CVA tenderness, no spinal tenderness  Lungs:     Clear to auscultation bilaterally, without wheezes, rhonchi or rales  Chest wall:    No tenderness to palpitation  Heart:    Regular rate and rhythm without murmurs, gallops, rubs  Abdomen:     Soft, non-tender, nondistended, normal bowel sounds, no organomegaly  Genitalia:    deferred  Rectal:    deferred  Extremities:   No clubbing, cyanosis or edema.  Pulses:   2+ and symmetric all extremities  Skin:   Skin color, texture, turgor normal, no rashes or lesions  Lymph nodes:   Cervical, supraclavicular, and axillary nodes normal  Neurologic:   CNII-XII intact. Normal strength, sensation and reflexes      throughout    Labs on Admission:  Basic Metabolic Panel:  Recent Labs Lab 04/21/13 1942  NA 142  K 4.0  CL 102  CO2 28  GLUCOSE 128*  BUN 24*  CREATININE 1.20  CALCIUM 8.4   Liver Function Tests: No results found for this basename: AST, ALT, ALKPHOS, BILITOT, PROT, ALBUMIN,  in the last 168 hours No results found for this basename: LIPASE, AMYLASE,  in the last 168 hours No results found for this basename: AMMONIA,  in the last 168 hours CBC:  Recent Labs Lab 04/17/13 0922 04/21/13 1942  WBC 8.8 6.9  NEUTROABS 6.4 5.8  HGB 9.9* 8.1*  HCT  29.9* 24.8*  MCV 96 97.3  PLT 85* 105*   Cardiac  Enzymes: No results found for this basename: CKTOTAL, CKMB, CKMBINDEX, TROPONINI,  in the last 168 hours  BNP (last 3 results)  Recent Labs  02/10/13 1808 04/02/13 1920  PROBNP 856.8* 1477.0*   CBG: No results found for this basename: GLUCAP,  in the last 168 hours  Radiological Exams on Admission: US Venous Img Lower Unilateral Left  04/21/2013   CLINICAL DATA:  Bilateral lower extremity swelling  EXAM: LOWER EXTREMITY VENOUS DOPPLER ULTRASOUND  TECHNIQUE: Gray-scale sonography with graded compression, as well as color Doppler and duplex ultrasound were performed to evaluate the lower extremity deep venous systems from the level of the common femoral vein and including the common femoral, femoral, profunda femoral, popliteal and calf veins including the posterior tibial, peroneal and gastrocnemius veins when visible. The superficial great saphenous vein was also interrogated. Spectral Doppler was utilized to evaluate flow at rest and with distal augmentation maneuvers in the common femoral, femoral and popliteal veins.  COMPARISON:  None.  FINDINGS: Common Femoral Vein: No evidence of thrombus. Normal compressibility, respiratory phasicity and response to augmentation.  Saphenofemoral Junction: No evidence of thrombus. Normal compressibility and flow on color Doppler imaging.  Profunda Femoral Vein: No evidence of thrombus. Normal compressibility and flow on color Doppler imaging.  Femoral Vein: No evidence of thrombus. Normal compressibility, respiratory phasicity and response to augmentation.  Popliteal Vein: No evidence of thrombus. Normal compressibility, respiratory phasicity and response to augmentation.  Calf Veins: No evidence of thrombus. Normal compressibility and flow on color Doppler imaging.  Superficial Great Saphenous Vein: No evidence of thrombus. Normal compressibility and flow on color Doppler imaging.  Venous Reflux:  None.  Other Findings:  None.  IMPRESSION: No deep venous  thrombosis of the left lower extremity.   Electronically Signed   By: Kathreen Devoid   On: 04/21/2013 21:44   US Venous Img Lower Unilateral Right  04/21/2013   CLINICAL DATA:  Bilateral lower extremity swelling  EXAM: RIGHT LOWER EXTREMITY VENOUS DOPPLER ULTRASOUND  TECHNIQUE: Gray-scale sonography with graded compression, as well as color Doppler and duplex ultrasound were performed to evaluate the lower extremity deep venous systems from the level of the common femoral vein and including the common femoral, femoral, profunda femoral, popliteal and calf veins including the posterior tibial, peroneal and gastrocnemius veins when visible. The superficial great saphenous vein was also interrogated. Spectral Doppler was utilized to evaluate flow at rest and with distal augmentation maneuvers in the common femoral, femoral and popliteal veins.  COMPARISON:  None.  FINDINGS: Common Femoral Vein: No evidence of thrombus. Normal compressibility, respiratory phasicity and response to augmentation.  Saphenofemoral Junction: No evidence of thrombus. Normal compressibility and flow on color Doppler imaging.  Profunda Femoral Vein: No evidence of thrombus. Normal compressibility and flow on color Doppler imaging.  Femoral Vein: The superficial femoral vein is noncompressible with echogenic material within the vein and relative lack of Doppler flow proximally concerning for occlusive deep venous thrombosis.  Popliteal Vein: The right popliteal vein is noncompressible with a small amount of color flow noted concerning for nonocclusive deep venous thrombosis.  Calf Veins: Not visualized.  Superficial Great Saphenous Vein: There is mural thickening of the greater saphenous vein without echogenic thrombus within the lumen. The appearance is suggestive of chronic thrombosis with recanalization.  Venous Reflux:  None.  Other Findings: There is a 4.3 x 2.7 x 6.8 cm irregular hypoechoic mass in the  region of the mid right calf which  may reflect a hematoma. There is no internal Doppler flow.  IMPRESSION: 1. Occlusive deep venous thrombosis of the right femoral vein. 2. Nonocclusive deep venous thrombosis of the right popliteal vein. 3. There is a 4.3 x 2.7 x 6.8 cm irregular hypoechoic mass without internal Doppler flow, in the region of the mid right calf which may reflect a hematoma.   Electronically Signed   By: Kathreen Devoid   On: 04/21/2013 21:48    EKG: Independently reviewed.  Assessment/Plan Principal Problem:   Acute deep vein thrombosis (DVT) of femoral vein of right lower extremity Active Problems:   Hypertension   Mesothelioma (pleural)   History of DVT (deep vein thrombosis)   Anemia   Acute deep vein thrombosis (DVT) of popliteal vein of right lower extremity   1. Acute occlusive R femoral and non-occlusive R popliteal DVT - extensive hypercoagulable history (see HPI) and complex case due to recent LGIB (also see HPI), After discussion with Dr. Earlie Server (heme/onc on call) patient was given a single dose of lovenox earlier this evening, with plan to have Dr. Marin Olp see the patient in the morning and make the decision about ongoing anticoagulation.  Stool Guiac negative, no signs or symptoms to indicate PE and he has an IVC filter that was just put in place in the last 3 weeks. 2. Anemia - Patient also has symptomatic anemia with increasing fatigue recently, HGB noted to have dropped from 9.9 on 4/9 to 8.1 today.  May require transfusion, but can leave this for Dr. Marin Olp to decide in morning as patient has no acute symptoms right now.  Guiac negative and patient states no GI bleed since discharge from hospital, so suspect that his anemia may be related to his chemotherapy he received on 4/9 (It would appear that Dr. Marin Olp is also concerned about effects of chemo on the patient as he relates in his office note on 4/9 with plans to cut back dose). 3. Mesothelioma - Undergoing chemotherapy, responding to  chemotherapy, but unfortunatly chemo will have to be cut back due to patient being unable to tolerate much more of this (see Dr. Marin Olp 4/9 note). 4. HTN - slightly low BP in ED today, will hold his lisinopril.    Code Status: Full  Family Communication: No family in room Disposition Plan: Admit to inpatient   Time spent: 73 min  [facility] Pager [phone]  If 7AM-7PM, please contact the day team taking care of the patient [email or web address] Password [facility] 04/22/2013, 3:44 AM

## 2013-04-22 NOTE — ED Notes (Signed)
Pt updated on care. Pending for bed at [facility].

## 2013-04-22 NOTE — Consult Note (Signed)
Steve [name]  [phone]    HOSPITAL PROGRESS NOTE  Principal  Diagnosis:   . 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  2. Heterozygote factor V Leiden mutation.  3. Lupus anticoagulant positive.  4. Deep venous thrombosis of the right leg.  5. 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  6. Papillary carcinoma of the thyroid.   Current Therapy:  Status post 5 cycles of carboplatin/Alimta, now dose reduced due to tolerability issues. Last dose (reducedD1 C5) given on 4/9.  He was last seen by Dr. Marin Olp at the [facility] on 04/17/13 following a hospitalization for severe anemia in the setting of lower GI bleeding, for which Xarelto therapy had to be discontinued. On 3/28, IVC filter was placed during that hospitalization.   HPI: Steve [name] was admitted on 4/13 with 2 week history of  bilateral lower extremity swelling and pain as well as a right lower calf hematoma. Dopplers revealed occlusive DVT of the right femoral vein, along with non occlusive DVT of the right popliteal vein. A large hematoma was confirmed on the right calf of 6.8 cm. Left lower extremity was negative for DVT.Lovenox was initiated by Pharmacy at 30 mg sq then transitioned to 90 mg sq bid. He feels weak today, with mild shortness of breath on exertion, which is chronic, otherwise verbalized no other complaints. Denies any other areas of hematoma or acute bleeding issues. Guaiac at the ED was negative.We were requested to see the patient in consultation due to recurrent DVT despite filter placement in the setting of mesothelioma and hematologic history.He continues to smoke.   MEDICATIONS: Scheduled Meds: . budesonide-formoterol  2 puff Inhalation BID  . calcium carbonate  2 tablet Oral BID  . enoxaparin (LOVENOX) injection  90 mg Subcutaneous Q12H  . folic acid  1 mg Oral Daily  . levothyroxine  150 mcg Oral QAC breakfast  . losartan  25 mg Oral Daily  .  metoprolol tartrate  12.5 mg Oral BID  . nicotine  21 mg Transdermal Daily  . ondansetron  8 mg Oral BID  . polyethylene glycol  17 g Oral BID  . senna  2 tablet Oral QHS  . sertraline  100 mg Oral Daily  . tiotropium  18 mcg Inhalation q morning - 10a   Continuous Infusions:  PRN Meds:.albuterol, LORazepam, traMADol   ALLERGIES:   Allergies  Allergen Reactions  . Lyrica [Pregabalin] Hives, Itching and Rash     PHYSICAL EXAMINATION:   Filed Vitals:   04/22/13 1405  BP: 134/52  Pulse: 62  Temp: 97.6 F (36.4 C)  Resp: 22     Filed Weights   04/21/13 1859 04/22/13 0302  Weight: 195 lb (88.451 kg) 197 lb 1.5 oz (89.67 kg)    80 year old white male in no acute distress, alert and oriented x3  General well-developed and ill appearing HEENT: Normocephalic,atraumatic,sclera anicteric.Oral cavity without thrush or lesions. Neck supple. no thyromegaly, no cervical or supraclavicular adenopathy  Lungs trace bibasilar rales, no wheezing or rhonchi Cardiac: regular rate and rhythm,no murmur,rubs or gallops Abdomen soft nontender , bowel sounds x4. No hepatosplenomegaly Extremities mild cubbing no cyanosis or edema. No petechial rash, skin very dry. A right calf large hematoma is seen, as described above. Neuro: non focal  LABORATORY/RADIOLOGY DATA:   Recent Labs Lab 04/17/13 0922 04/21/13 1942 04/22/13 0455  WBC 8.8 6.9 5.6  HGB 9.9* 8.1* 8.3*  HCT 29.9* 24.8* 24.4*  PLT 85* 105* 101*  MCV 96  97.3 94.6  MCH 31.8 31.8 32.2  MCHC 33.1 32.7 34.0  RDW 17.4* 17.7* 17.9*  LYMPHSABS 1.4 0.9  --   MONOABS  --  0.2  --   EOSABS 0.0 0.0  --   BASOSABS 0.1 0.0  --     CMP    Recent Labs Lab 04/21/13 1942 04/22/13 0455  NA 142 141  K 4.0 4.0  CL 102 102  CO2 28 30  GLUCOSE 128* 111*  BUN 24* 24*  CREATININE 1.20 1.06  CALCIUM 8.4 8.0*        Component Value Date/Time   BILITOT 0.43 04/14/2013 1312   BILITOT 0.4 04/03/2013 0613   BILITOT 0.50 03/27/2013 0850     Radiology Studies:  CLINICAL DATA: Subsequent treatment strategy for mesothelioma.  FINDINGS:  NECK  No hypermetabolic lymph nodes in the neck. CT images show no acute  findings.  CHEST  A relatively thin rind of pleural thickening is seen in the inferior right hemi thorax, with mild associated hypermetabolism. SUV max is seen inferomedially at 3.6 (PET image 90). No hypermetabolic mediastinal, hilar or axillary lymph nodes. No hypermetabolic pulmonary nodules. CT images show a right IJ Port-A-Cath terminating in the high right atrium. Heart is enlarged. Three-vessel coronary artery calcification. No pericardial effusion. Pulmonary parenchymal scarring in the right hemi thorax with associated volume loss. Centrilobular emphysema. A 12 mm left upper lobe nodule (series 8, image 37) is shows no abnormal hypermetabolism and is stable in size. No pleural fluid. Airway is unremarkable.  ABDOMEN/PELVIS  No abnormal hypermetabolism in the liver, adrenal glands, spleen or pancreas. No hypermetabolic lymph nodes. CT images show the liver to be grossly unremarkable. Cholecystectomy. Adrenal glands are unremarkable. Low-attenuation lesions off the right kidney measure up to 2.5 cm, as before. Left kidney, spleen, pancreas, stomach and bowel are unremarkable. Calcifications are seen in the prostate. Atherosclerotic calcification of the arterial vasculature with postoperative changes in the infrarenal aorta. Inferior vena cava filter is noted. Small ventral hernia contains fat. No free fluid.  SKELETON  No hypermetabolic osseous lesions. Healed left rib fractures. Compression deformities involving T11, T12 and L1 are unchanged.  IMPRESSION:  1. Thin rind of pleural thickening in the lower right hemi thorax with mild associated hypermetabolism, stable.  2. Three-vessel coronary artery calcification.    ASSESSMENT AND PLAN:   1. Mesothelioma,Status post 5 cycles of carboplatin/Alimta on 4/9.  2. History  of DVT, s/p IVC filter placement after discontinuation of Xarelto, now admitted with new  Right lower extremity DVT episode. Patient on Lovenox bid per Pharmacy. He continues to smoke. Smoking cessation counseling strongly recommended.He is at significant risk for further thromboembolic disease because of his factor V Leiden mutation and his positive lupus anticoagulant and his mesothelioma.  3. Anemia, multifactorial, in the setting of chronic disease, hematochezia and recent chemo. No transfusion was received during this admission. Guaiac on 4/13 was negative. May need transfusion if becomes more symptomatic or if acute bleeding.  4. Thrombocytopenia, in the setting hematochezia and recent chemo. Monitor closely as the patient is on Lovenox. No need for transfusion at this time.  5. COPD exacerbation, as per admitting team.   5. Full Code  Rondel Jumbo, PA-C 04/22/2013, 2:08 PM    ADDENDUM:  I am saw and examined Steve [name]. I agree with the above assessment by Steve [name].  He clearly is showing [place] that he is hyper coagulable. He does have a factor V Leiden. He has the lupus anticoagulant.  He has an underlying malignancy.  I cannot figure out why he would have the bleeding in the right calf muscle.  He does have the IVC filter in.  His leg feels better. Is not as swollen. As such I think it is wise to continue anticoagulation on him.  I probably would keep him on Lovenox as an outpatient. I think this would be easiest for right now, particularly with the possibility of him having bleeding.  His hemoglobin 7.6. His stools were heme-negative white came in. I suspect that this probably is from his chemotherapy. I will give him 2 units of blood.  He will not take any more chemotherapy from my point of view. I just think that we are not that again much greater benefit with further chemotherapy. I think we would be risking more complications and side effects.  He wants to go home. He will need  to have Lovenox approved and Lovenox teaching.  I would get him on daily Lovenox. I think the 120 milligrams dose be appropriate for right now. I think that if there are no issues with him bleeding, then we can probably see about converting him over to Xarelto again.  He is clearly a complicated case. He is hypercoagulable and is showing [place] that.  I would think that he probably will be able to go home in the next day or so. Again, he really needs to have the Lovenox teaching and make sure that he can get this and afford this and can get this at home.  I very much appreciate the outstanding care by Dr. Doyle Askew and the staff on 4 E.  Pete E.  Hebrews 12:12

## 2013-04-22 NOTE — Progress Notes (Signed)
ANTICOAGULATION CONSULT NOTE - Initial Consult  Pharmacy Consult for Lovenox Indication: DVT  Allergies  Allergen Reactions  . Lyrica [Pregabalin] Hives, Itching and Rash    Patient Measurements: Height: 5\' 8"  (172.7 cm) Weight: 197 lb 1.5 oz (89.4 kg) IBW/kg (Calculated) : 68.4   Vital Signs: Temp: 97.3 F (36.3 C) (04/14 0605) Temp src: Oral (04/14 0605) BP: 124/46 mmHg (04/14 1022) Pulse Rate: 62 (04/14 1022)  Labs:  Recent Labs  04/21/13 1942 04/22/13 0455  HGB 8.1* 8.3*  HCT 24.8* 24.4*  PLT 105* 101*  CREATININE 1.20 1.06    Estimated Creatinine Clearance: 63.4 ml/min (by C-G formula based on Cr of 1.06).   Medical History: Past Medical History  Diagnosis Date  . Prostate enlargement   . GERD (gastroesophageal reflux disease)   . AAA (abdominal aortic aneurysm)     stress test 09/14/10 EPIC  . Anxiety   . Recurrent upper respiratory infection (URI)     productive cough- white phlegm- no fever  . Hypertension     chest x ray 12/12 EPIC- repeated 06/06/11, EKG 11/12 EPIC  . DVT (deep venous thrombosis) 08/2011  . Pneumonia   . Bruises easily     takes Xarelto  . Depression   . Dysrhythmia     hx of atrial fib post op x 2 days   . Atrial fibrillation   . Mesothelioma   . Arthritis     knee and back  . Sleep apnea      12/12 sleep study,SEVERE per study  Dr Lamonte Sakai- states doesnt wear machine on regular basis- setting BiPAP 9/9  . H/O hiatal hernia   . COPD (chronic obstructive pulmonary disease)     patient uses oxygen 2L/[place] at nite   . Epistaxis 05/13/2012  . Olecranon bursitis of right elbow 06/28/2012    Medications:  Scheduled:  . budesonide-formoterol  2 puff Inhalation BID  . calcium carbonate  2 tablet Oral BID  . enoxaparin (LOVENOX) injection  90 mg Subcutaneous Q12H  . folic acid  1 mg Oral Daily  . levothyroxine  150 mcg Oral QAC breakfast  . losartan  25 mg Oral Daily  . metoprolol tartrate  12.5 mg Oral BID  . nicotine  21 mg  Transdermal Daily  . ondansetron  8 mg Oral BID  . polyethylene glycol  17 g Oral BID  . senna  2 tablet Oral QHS  . sertraline  100 mg Oral Daily  . tiotropium  18 mcg Inhalation q morning - 10a   Infusions:    Assessment: 80 yoM admitted 4/14 with acute DVT. PMH significant for mesothelioma and also extensive hypercoagulability w/ history of DVT, Lupus anticoagulant positive, and Heterozygote factor V Leiden mutation.  Previously on Xarelto, but was d/c during hospitalization 03/2013 d/t LGIB (associated w/ constipation and straining.)  IVC filter was placed and Xarelto was not resumed.  He also has anemia with Hgb decrease from 9.9 on 4/9 to 8.1 possibly related to chemo as pt denies further GI bleeding.  Stool guiac negative in ED 4/13.  Pharmacy is consulted to dose Lovenox on 4/14 for DVT.   First dose of Lovenox 30mg  (low dose prophylaxis) given at [facility] 4/13 ~2300  CBC: 8.3 (stable from 8.1 on admission), Plt 101 (low/stable)  SCr 1.06 with CrCl ~ 63 ml/min   Goal of Therapy:  Anti-Xa level 0.6-1 units/ml 4hrs after LMWH dose given Monitor platelets by anticoagulation protocol: Yes   Plan:   Lovenox 1  mg/kg (90 mg) SQ BID  F/u plans per oncology  F/u renal function and CBC.  Gretta Arab PharmD, BCPS Pager [phone] 04/22/2013 11:41 AM

## 2013-04-23 DIAGNOSIS — D696 Thrombocytopenia, unspecified: Secondary | ICD-10-CM

## 2013-04-23 DIAGNOSIS — D472 Monoclonal gammopathy: Secondary | ICD-10-CM

## 2013-04-23 DIAGNOSIS — D649 Anemia, unspecified: Secondary | ICD-10-CM

## 2013-04-23 DIAGNOSIS — D6859 Other primary thrombophilia: Secondary | ICD-10-CM

## 2013-04-23 DIAGNOSIS — J4489 Other specified chronic obstructive pulmonary disease: Secondary | ICD-10-CM

## 2013-04-23 DIAGNOSIS — I82409 Acute embolism and thrombosis of unspecified deep veins of unspecified lower extremity: Secondary | ICD-10-CM

## 2013-04-23 DIAGNOSIS — C384 Malignant neoplasm of pleura: Secondary | ICD-10-CM

## 2013-04-23 DIAGNOSIS — C73 Malignant neoplasm of thyroid gland: Secondary | ICD-10-CM

## 2013-04-23 DIAGNOSIS — J449 Chronic obstructive pulmonary disease, unspecified: Secondary | ICD-10-CM

## 2013-04-23 LAB — CBC
HCT: 22.5 % — ABNORMAL LOW (ref 39.0–52.0)
HEMOGLOBIN: 7.6 g/dL — AB (ref 13.0–17.0)
MCH: 31.3 pg (ref 26.0–34.0)
MCHC: 33.8 g/dL (ref 30.0–36.0)
MCV: 92.6 fL (ref 78.0–100.0)
Platelets: 102 10*3/uL — ABNORMAL LOW (ref 150–400)
RBC: 2.43 MIL/uL — ABNORMAL LOW (ref 4.22–5.81)
RDW: 17.4 % — AB (ref 11.5–15.5)
WBC: 4.5 10*3/uL (ref 4.0–10.5)

## 2013-04-23 LAB — PREPARE RBC (CROSSMATCH)

## 2013-04-23 MED ORDER — ENOXAPARIN SODIUM 120 MG/0.8ML ~~LOC~~ SOLN
120.0000 mg | SUBCUTANEOUS | Status: DC
  Administered 2013-04-23 – 2013-04-24 (×2): 120 mg via SUBCUTANEOUS
  Filled 2013-04-23 (×2): qty 0.8

## 2013-04-23 MED ORDER — ACETAMINOPHEN 325 MG PO TABS
650.0000 mg | ORAL_TABLET | Freq: Once | ORAL | Status: AC
  Administered 2013-04-23: 650 mg via ORAL
  Filled 2013-04-23: qty 2

## 2013-04-23 MED ORDER — FUROSEMIDE 10 MG/ML IJ SOLN
20.0000 mg | Freq: Once | INTRAMUSCULAR | Status: AC
  Administered 2013-04-23: 20 mg via INTRAVENOUS
  Filled 2013-04-23: qty 2

## 2013-04-23 MED ORDER — DIPHENHYDRAMINE HCL 25 MG PO CAPS
25.0000 mg | ORAL_CAPSULE | Freq: Once | ORAL | Status: AC
  Administered 2013-04-23: 25 mg via ORAL
  Filled 2013-04-23: qty 1

## 2013-04-23 MED ORDER — ENOXAPARIN (LOVENOX) PATIENT EDUCATION KIT
PACK | Freq: Once | Status: AC
  Administered 2013-04-23: 12:00:00
  Filled 2013-04-23: qty 1

## 2013-04-23 NOTE — Progress Notes (Signed)
[facility] PROGRESS NOTE  Steve [name] [id number]:[id number] DOB: 02-28-1933 DOA: 04/21/2013 PCP: Penni Homans, MD  Brief narrative: 80 y/o ?, known h/o  stg III mesothelioma 2/2 to asbestos exposure [s/p VATS + decortication 10/13], heterozygote Factovr v Leiden, Lupus AC +, IgA Lambda MGUS, Papillary thyroid Ca, prior DVT 11/02/11 presenting to [facility] long emergency department (transfer for [facility]) with main concern of progressively worsening lower extremity pain and swelling, started 2 weeks ago and with no specific alleviating factors.  Recent admission 3/25/  ???3/30 LGIB thoguth to be maybe Hemorrhoidal, self inflicted trauma [4/4 to self-disimpaction]-hd IVC filter placed 3/28.  Patient describes pain as constant, throbbing, 7/10 in severity, radiating from the groin area to bilateral feet, worse with walking. Patient reports this appears to have started right after he was taken off Xarelto 2 weeks prior to this admission.  In emergency department, right lower extremity venous Doppler positive for occlusive DVT of the right femoral vein and nonocclusive DVT in right popliteal vein. Patient given one dose of therapeutic Lovenox. [facility] asked to admit for further evaluation  Principal Problem:   Acute deep vein thrombosis (DVT) of femoral vein of right lower extremity -started on Lovenox,  to 120 mg once daily, hemoglobin appears to be stable since admission -no dark or tarry stools - Oncology to determine best time to transition back to Xarelto - FOBT negative Active Problems:   Hypertension - Reasonable inpatient control - Continue losartan 25  and metoprolol 12.5 bid     Mesothelioma (pleural), Stg III - Appreciate oncology input -further discussion about chemo, XRT to be made as OP by Dr. Marin Olp   Anemia of chronic disease - Recent admission for rectal bleeding that was associated with constipation and straining - FOBT negative in the hospital, patient  denies noticing any blood in stool -received 2 U PRBC 04/23/13   Hypothyroidism 2/2 to papillary throid Ca s/p resection - continue synthroid  -recheck TSH in 1 mo after d/c from Hospital   Depression -continue Sertraline 100 daily,Ativan 0.5 q6 prn   OSA -uses oxygen at night.  To continue at home as cannot tol mask  Consultants:   Oncology  Procedures/Studies: US Venous Img Lower Unilateral Left  04/21/2013  No deep venous thrombosis of the left lower extremity.     US Venous Img Lower Unilateral Right   04/21/2013   Occlusive DVT of the right femoral vein. Nonocclusive DVT of the right popliteal vein. There is a 4.3 x 2.7 x 6.8 cm irregular hypoechoic mass without internal Doppler flow, in the region of the mid right calf which may reflect a hematoma.    Antibiotics:  None   Code Status: Full Family Communication: Pt at bedside Disposition Plan: Home when medically stable  HPI/Subjective: Doing fair No c/o   tol diet Eating and driking.  No LE pain or dioscomfort .   Objective: Filed Vitals:   04/23/13 0910 04/23/13 1040 04/23/13 1100 04/23/13 1200  BP:  138/60 140/63 120/72  Pulse:  60 62 59  Temp:  98 F (36.7 C) 98.3 F (36.8 C) 98.3 F (36.8 C)  TempSrc:  Oral Oral Oral  Resp:  20 20 18   Height:      Weight:      SpO2: 96% 97% 97% 97%    Intake/Output Summary (Last 24 hours) at 04/23/13 1305 Last data filed at 04/23/13 1045  Gross per 24 hour  Intake    132 ml  Output   1175 ml  Net  -1043 ml    Exam:   General:  Pt is alert, follows commands appropriately, not in acute distress  Cardiovascular: Regular rate and rhythm, S1/S2, no murmurs, no rubs, no gallops  Respiratory: Clear to auscultation bilaterally, no wheezing, no crackles, no rhonchi  Abdomen: Soft, non tender, non distended, bowel sounds present, no guarding   Data Reviewed: Basic Metabolic Panel:  Recent Labs Lab 04/21/13 1942 04/22/13 0455  NA 142 141  K 4.0 4.0  CL 102 102    CO2 28 30  GLUCOSE 128* 111*  BUN 24* 24*  CREATININE 1.20 1.06  CALCIUM 8.4 8.0*   CBC:  Recent Labs Lab 04/17/13 0922 04/21/13 1942 04/22/13 0455 04/23/13 0355  WBC 8.8 6.9 5.6 4.5  NEUTROABS 6.4 5.8  --   --   HGB 9.9* 8.1* 8.3* 7.6*  HCT 29.9* 24.8* 24.4* 22.5*  MCV 96 97.3 94.6 92.6  PLT 85* 105* 101* 102*    Scheduled Meds: . budesonide-formoterol  2 puff Inhalation BID  . calcium carbonate  2 tablet Oral BID  . enoxaparin (LOVENOX) injection  120 mg Subcutaneous Q24H  . enoxaparin   Does not apply Once  . folic acid  1 mg Oral Daily  . furosemide  20 mg Intravenous Once  . levothyroxine  150 mcg Oral QAC breakfast  . losartan  25 mg Oral Daily  . metoprolol tartrate  12.5 mg Oral BID  . nicotine  21 mg Transdermal Daily  . ondansetron  8 mg Oral BID  . polyethylene glycol  17 g Oral BID  . senna  2 tablet Oral QHS  . sertraline  100 mg Oral Daily  . tiotropium  18 mcg Inhalation q morning - 10a   Continuous Infusions:    Nita Sells, MD  [facility] Pager [phone]  If 7PM-7AM, please contact night-coverage [email or web address] Password [email or web address] 04/23/2013, 1:05 PM   LOS: 2 days

## 2013-04-23 NOTE — Progress Notes (Signed)
Patient received two units of Packed Red Blood Cells. Patient tolerated transfusion without any complications. Will continue to monitor patient.

## 2013-04-24 DIAGNOSIS — Z86718 Personal history of other venous thrombosis and embolism: Secondary | ICD-10-CM

## 2013-04-24 LAB — TYPE AND SCREEN
ABO/RH(D): A POS
ANTIBODY SCREEN: NEGATIVE
UNIT DIVISION: 0
Unit division: 0

## 2013-04-24 LAB — CBC
HEMATOCRIT: 28.4 % — AB (ref 39.0–52.0)
Hemoglobin: 9.8 g/dL — ABNORMAL LOW (ref 13.0–17.0)
MCH: 30.6 pg (ref 26.0–34.0)
MCHC: 34.5 g/dL (ref 30.0–36.0)
MCV: 88.8 fL (ref 78.0–100.0)
Platelets: 87 10*3/uL — ABNORMAL LOW (ref 150–400)
RBC: 3.2 MIL/uL — ABNORMAL LOW (ref 4.22–5.81)
RDW: 15.8 % — ABNORMAL HIGH (ref 11.5–15.5)
WBC: 3.3 10*3/uL — ABNORMAL LOW (ref 4.0–10.5)

## 2013-04-24 MED ORDER — HEPARIN SOD (PORK) LOCK FLUSH 100 UNIT/ML IV SOLN
500.0000 [IU] | INTRAVENOUS | Status: AC | PRN
  Administered 2013-04-24: 500 [IU]

## 2013-04-24 MED ORDER — NICOTINE 21 MG/24HR TD PT24: 21.0000 mg | MEDICATED_PATCH | Freq: Every day | TRANSDERMAL | Status: DC

## 2013-04-24 MED ORDER — ENOXAPARIN SODIUM 120 MG/0.8ML ~~LOC~~ SOLN: 120.0000 mg | SUBCUTANEOUS | Status: DC

## 2013-04-24 NOTE — Progress Notes (Signed)
Pt discharged to home in stable condition via daughter's car.  Discharge paperwork reviewed and signed with pt and daughter.  Pt denies any further questions or concerns at this time. Port deaccessed by IV team. Garry Heater, RN 04/24/2013

## 2013-04-24 NOTE — Discharge Summary (Signed)
Physician Discharge Summary  Steve [name] CHE:[id number] DOB: [birth year] DOA: 04/21/2013  PCP: Penni Homans, MD  Admit date: 04/21/2013 Discharge date: 04/24/2013  Time spent: 35 minutes  Recommendations for Outpatient Follow-up:  1. Need to followup with her oncologist determine best oral anticoagulant-for now continue Lovenox 120 daily 2. Needs CBC plus differential and complete metabolic panel in about a week 3. Decision regarding XRT and radiation therapy deferred to oncology 4. Consider increasing her Toprol to 25 twice a day if blood pressures remain elevated 5. Recommend rechecking TSH in one month after hospital stay  Discharge Diagnoses:  Principal Problem:   Acute deep vein thrombosis (DVT) of femoral vein of right lower extremity Active Problems:   Hypertension   Mesothelioma (pleural)   History of DVT (deep vein thrombosis)   Anemia   Acute deep vein thrombosis (DVT) of popliteal vein of right lower extremity   Discharge Condition: Good  Diet recommendation: Regular  Filed Weights   04/21/13 1859 04/22/13 0302  Weight: 88.451 kg (195 lb) 89.4 kg (197 lb 1.5 oz)    History of present illness:  80 y/o ?, known h/o stg III mesothelioma 2/2 to asbestos exposure [s/p VATS + decortication 10/13], heterozygote Factovr v Leiden, Lupus AC +, IgA Lambda MGUS, Papillary thyroid Ca, prior DVT 11/02/11 presenting to [facility] emergency department (transfer [facility]) with main concern of progressively worsening lower extremity pain and swelling, started 2 weeks prior + R calf hematoma Recent admission 3/25/ ???3/30 LGIB thougt to be maybe Hemorrhoidal, self inflicted trauma [5/3 to self-disimpaction]-and as such anti-coagulation with xarelto was d/c and patient had IVC filter placed 3/28.   Hospital Course:  Acute deep vein thrombosis (DVT) of femoral vein of right lower extremity, s/p IVC filter placed 3/28  -started on Lovenox, to 120 mg once  daily, hemoglobin appears to be stable since admission  -no dark or tarry stools  - Oncology to determine best time to transition back to Xarelto  - FOBT negative   Hypertension  - Reasonable inpatient control  - Continue losartan 25 and metoprolol 12.5 bid-May need to increase this dose to 25 twice a day Mesothelioma (pleural), Stg III  - Appreciate oncology input  -further discussion about chemo, XRT to be made as OP by Dr. Marin Olp  Anemia of chronic disease  - Recent admission for rectal bleeding that was associated with constipation and straining  - FOBT negative in the hospital, patient denies noticing any blood in stool  -received 2 U PRBC 04/23/13 , posttransfusion Hb 9.8 Hypothyroidism 2/2 to papillary throid Ca s/p resection  - continue synthroid  -recheck TSH in 1 mo after d/c from Hospital  Depression  -continue Sertraline 100 daily,Ativan 0.5 q6 prn  OSA  -uses oxygen at night. To continue at home as cannot tol mask  Follow up with pulmonologist as an outpatient COPD -Continue prior to admission medications and care Stable during hospital stay-  Consultants:  Oncology  Procedures/Studies:  US Venous Img Lower Unilateral Left 04/21/2013 No deep venous thrombosis of the left lower extremity.  US Venous Img Lower Unilateral Right 04/21/2013 Occlusive DVT of the right femoral vein. Nonocclusive DVT of the right popliteal vein. There is a 4.3 x 2.7 x 6.8 cm irregular hypoechoic mass without internal Doppler flow, in the region of the mid right calf which may reflect a hematoma.  Antibiotics:  None     Discharge Exam: Filed Vitals:   04/24/13 0532  BP:  156/71  Pulse: 58  Temp: 98.1 F (36.7 C)  Resp: 20    General: Alert pleasant and oriented in no apparent distress Cardiovascular: S1-S2 no murmur rub or gallop Respiratory: Clinically clear  Discharge Instructions You were cared for by a hospitalist during your hospital stay. If you have any questions about your  discharge medications or the care you received while you were in the hospital after you are discharged, you can call the unit and asked to speak with the hospitalist on call if the hospitalist that took care of you is not available. Once you are discharged, your primary care physician will handle any further medical issues. Please note that NO REFILLS for any discharge medications will be authorized once you are discharged, as it is imperative that you return to your primary care physician (or establish a relationship with a primary care physician if you do not have one) for your aftercare needs so that they can reassess your need for medications and monitor your lab values.  Discharge Orders   Future Appointments Provider Department Dept Phone   04/30/2013 2:00 PM Collene Gobble, MD [facility] [phone]   05/16/2013 11:30 AM [place] [phone]   05/16/2013 12:00 PM Volanda Napoleon, MD [place] [phone]   05/16/2013 12:30 PM [facility] [place] [phone]   Future Orders Complete By Expires   Diet - low sodium heart healthy  As directed    Discharge instructions  As directed    Increase activity slowly  As directed        Medication List         albuterol 108 (90 BASE) MCG/ACT inhaler  Commonly known as:  PROVENTIL HFA;VENTOLIN HFA  Inhale 1 puff into the lungs every 6 (six) hours as needed for shortness of breath.     albuterol (2.5 MG/3ML) 0.083% nebulizer solution  Commonly known as:  PROVENTIL  Take 3 mLs (2.5 mg total) by nebulization every 6 (six) hours as needed for wheezing.     budesonide-formoterol 160-4.5 MCG/ACT inhaler  Commonly known as:  SYMBICORT  Inhale 2 puffs into the lungs 2 (two) times daily.     calcium carbonate 1250 MG tablet  Commonly known as:  OS-CAL - dosed in mg of elemental calcium  Take 2 tablets by mouth 2 (two) times  daily.     enoxaparin 120 MG/0.8ML injection  Commonly known as:  LOVENOX  Inject 0.8 mLs (120 mg total) into the skin daily.     folic acid 1 MG tablet  Commonly known as:  FOLVITE  Take 1 mg by mouth daily.     levothyroxine 150 MCG tablet  Commonly known as:  SYNTHROID, LEVOTHROID  Take 150 mcg by mouth daily before breakfast.     lidocaine-prilocaine cream  Commonly known as:  EMLA  Apply 1 application topically as needed.     lisinopril 10 MG tablet  Commonly known as:  PRINIVIL,ZESTRIL  Take 10 mg by mouth 2 (two) times daily.     LORazepam 0.5 MG tablet  Commonly known as:  ATIVAN  Take 1 tablet (0.5 mg total) by mouth every 6 (six) hours as needed (Nausea or vomiting).     losartan 25 MG tablet  Commonly known as:  COZAAR  Take 25 mg by mouth daily.     metoprolol tartrate 25 MG tablet  Commonly known  as:  LOPRESSOR  Take 12.5 mg by mouth 2 (two) times daily.     nicotine 21 mg/24hr patch  Commonly known as:  NICODERM CQ - dosed in mg/24 hours  Place 1 patch (21 mg total) onto the skin daily.     ondansetron 8 MG tablet  Commonly known as:  ZOFRAN  Take 1 tablet (8 mg total) by mouth 2 (two) times daily. Take two times a day starting the day after chemo for 3 days. Then take two times a day as needed for nausea or vomiting.     polyethylene glycol packet  Commonly known as:  MIRALAX  Take 17 g by mouth 2 (two) times daily.     senna 8.6 MG Tabs tablet  Commonly known as:  SENOKOT  Take 2 tablets by mouth at bedtime.     sertraline 100 MG tablet  Commonly known as:  ZOLOFT  Take 100 mg by mouth daily with breakfast.     SPIRIVA HANDIHALER 18 MCG inhalation capsule  Generic drug:  tiotropium  Place 18 mcg into inhaler and inhale every morning.     traMADol 50 MG tablet  Commonly known as:  ULTRAM  Take 50 mg by mouth every 6 (six) hours as needed for moderate pain or severe pain.     Vitamin D3 1000 UNITS Caps  Take 1 capsule by mouth daily.         Allergies  Allergen Reactions  . Lyrica [Pregabalin] Hives, Itching and Rash      The results of significant diagnostics from this hospitalization (including imaging, microbiology, ancillary and laboratory) are listed below for reference.    Significant Diagnostic Studies: Ct Head Wo Contrast  04/02/2013   CLINICAL DATA:  Hypertension.  Constipation.  Extremity weakness.  EXAM: CT HEAD WITHOUT CONTRAST  TECHNIQUE: Contiguous axial images were obtained from the base of the skull through the vertex without intravenous contrast.  COMPARISON:  Head MRI 05/18/2012  FINDINGS: There is mild cerebral atrophy, not significantly changed. Patchy hyperintensities in the cerebral white matter bilaterally are nonspecific but compatible with mild chronic small vessel ischemic disease. There is no evidence of acute cortical infarct, mass, midline shift, intracranial hemorrhage, or extra-axial fluid collection. Orbits are unremarkable. The mastoid air cells are clear. The right maxillary sinus is small and demonstrates moderate mucosal thickening, new from prior MRI.  IMPRESSION: No evidence of acute intracranial abnormality. Mild chronic small vessel ischemic disease.   Electronically Signed   By: Logan Bores   On: 04/02/2013 21:36   Ct Abdomen Pelvis W Contrast  04/02/2013   CLINICAL DATA:  Hypertension. Constipation. Extremity weakness. Mesothelioma.  EXAM: CT ABDOMEN AND PELVIS WITH CONTRAST  TECHNIQUE: Multidetector CT imaging of the abdomen and pelvis was performed using the standard protocol following bolus administration of intravenous contrast.  CONTRAST:  158mL OMNIPAQUE IOHEXOL 300 MG/ML  SOLN  COMPARISON:  PET-CT 03/03/2013  FINDINGS: Irregular pleural thickening is partially visualized in the right lung base, similar to the prior PET-CT. Loculated fluid in the right major fissure has increased, incompletely visualized. Mild dependent atelectasis is present in the left lower lobe. Three-vessel coronary  artery calcifications are present.  Calcification in the inferior right hepatic lobe is unchanged. The gallbladder is surgically absent. 2.4 cm soft tissue lesion adjacent to the upper pole of the right kidney is unchanged. 2.0 cm left upper pole renal cyst anteriorly is unchanged. The left kidney and adrenal glands are unremarkable. Small calcification in the pancreatic head  is unchanged.  There is very mild mesenteric stranding adjacent to the distal descending colon in the left lower quadrant. Air-fluid levels are present in the colon, which is nondilated. No gross bowel wall thickening is identified. The appendix is identified in the right lower quadrant and is unremarkable. The small bowel is nondilated.  A Foley catheter is present in the bladder. No free fluid or enlarged lymph nodes are identified. Advanced atherosclerotic calcification is noted of the abdominal aorta and iliac arteries. Multiple healing, nondisplaced left-sided rib fractures are partially visualized as described on recent PET-CT. Mild T12 and moderate L1 compression fractures are unchanged.  IMPRESSION: 1. Fluid-filled colon with very mild inflammatory stranding around the distal descending colon. No definite bowel wall thickening is seen, however mild colitis is not excluded. 2. Irregular pleural thickening in the right lung base, consistent with known midtibial EOMI. Fluid in the right major fissure has increased from the prior PET-CT. 3. Unchanged soft tissue lesion adjacent to the upper pole of the right kidney.   Electronically Signed   By: Logan Bores   On: 04/02/2013 21:54   Ir Ivc Filter Plmt / S&i /img Guid/mod Sed  04/05/2013   CLINICAL DATA:  History of malignant mesothelioma and lower extremity DVT. The patient has developed hematochezia while on anticoagulation and requires an IVC filter due to a current contraindication to anticoagulation.  EXAM: 1. ULTRASOUND GUIDANCE FOR VASCULAR ACCESS OF THE RIGHT INTERNAL JUGULAR  VEIN. 2. IVC VENOGRAM. 3. PERCUTANEOUS IVC FILTER PLACEMENT.  ANESTHESIA/SEDATION: 1.0 mg IV Versed; 50 mcg IV Fentanyl.  Total Moderate Sedation Time  61minutes.  CONTRAST:  78mL OMNIPAQUE IOHEXOL 300 MG/ML  SOLN  FLUOROSCOPY TIME:  1 minute and 36 seconds.  PROCEDURE: The procedure, risks, benefits, and alternatives were explained to the patient. Questions regarding the procedure were encouraged and answered. The patient understands and consents to the procedure.  The right neck was prepped with Betadine in a sterile fashion, and a sterile drape was applied covering the operative field. A sterile gown and sterile gloves were used for the procedure. Local anesthesia was provided with 1% Lidocaine. Ultrasound was used to confirm patency of the right internal jugular vein.  Under direct ultrasound guidance, a 21 gauge needle was advanced into the right internal jugular vein with ultrasound image documentation performed. After securing access with a micropuncture dilator, a guidewire was advanced into the inferior vena cava. A deployment sheath was advanced over the guidewire. This was utilized to perform IVC venography.  The deployment sheath was further positioned in an appropriate location for filter deployment. A Bard Denali IVC filter was then advanced in the sheath. This was then fully deployed in the infrarenal IVC. Final filter position was confirmed with a fluoroscopic spot image. Contrast injection was also performed through the sheath under fluoroscopy to confirm patency of the IVC at the level of the filter. After the procedure the sheath was removed and hemostasis obtained with manual compression.  COMPLICATIONS: None.  FINDINGS: IVC venography demonstrates a normal caliber IVC with no evidence of thrombus. Renal veins are identified bilaterally. The IVC filter was successfully positioned below the level of the renal veins and is appropriately oriented. This IVC filter has both permanent and retrievable  indications.  IMPRESSION: Placement of percutaneous IVC filter in infrarenal IVC. IVC venogram shows no evidence of IVC thrombus and normal caliber of the inferior vena cava. This filter does have both permanent and retrievable indications.   Electronically Signed   By: Eulas Post  Kathlene Cote M.D.   On: 04/05/2013 14:40   Nm Pet Image Restag (ps) Skull Base To Thigh  04/14/2013   CLINICAL DATA:  Subsequent treatment strategy for mesothelioma.  EXAM: NUCLEAR MEDICINE PET SKULL BASE TO THIGH  TECHNIQUE: 10.9 mCi F-18 FDG was injected intravenously. Full-ring PET imaging was performed from the skull base to thigh after the radiotracer. CT data was obtained and used for attenuation correction and anatomic localization.  FASTING BLOOD GLUCOSE:  Value: 98 mg/dl  COMPARISON:  NM PET IMAGE RESTAG (PS) SKULL BASE TO THIGH dated 03/03/2013; CT ABD/PELVIS W CM dated 04/02/2013; CT ANGIO CHEST W/CM &/OR WO/CM dated 02/10/2013  FINDINGS: NECK  No hypermetabolic lymph nodes in the neck. CT images show no acute findings.  CHEST  A relatively thin rind of pleural thickening is seen in the inferior right hemi thorax, with mild associated hypermetabolism. SUV max is seen inferomedially at 3.6 (PET image 90). No hypermetabolic mediastinal, hilar or axillary lymph nodes. No hypermetabolic pulmonary nodules.  CT images show a right IJ Port-A-Cath terminating in the high right atrium. Heart is enlarged. Three-vessel coronary artery calcification. No pericardial effusion. Pulmonary parenchymal scarring in the right hemi thorax with associated volume loss. Centrilobular emphysema. A 12 mm left upper lobe nodule (series 8, image 37) is shows no abnormal hypermetabolism and is stable in size. No pleural fluid. Airway is unremarkable.  ABDOMEN/PELVIS  No abnormal hypermetabolism in the liver, adrenal glands, spleen or pancreas. No hypermetabolic lymph nodes.  CT images show the liver to be grossly unremarkable. Cholecystectomy. Adrenal glands are  unremarkable. Low-attenuation lesions off the right kidney measure up to 2.5 cm, as before. Left kidney, spleen, pancreas, stomach and bowel are unremarkable. Calcifications are seen in the prostate. Atherosclerotic calcification of the arterial vasculature with postoperative changes in the infrarenal aorta. Inferior vena cava filter is noted. Small ventral hernia contains fat. No free fluid.  SKELETON  No hypermetabolic osseous lesions. Healed left rib fractures. Compression deformities involving T11, T12 and L1 are unchanged.  IMPRESSION:  1. Thin rind of pleural thickening in the lower right hemi thorax with mild associated hypermetabolism, stable. 2. Three-vessel coronary artery calcification.   Electronically Signed   By: Lorin Picket M.D.   On: 04/14/2013 14:47   US Venous Img Lower Unilateral Left  04/21/2013   CLINICAL DATA:  Bilateral lower extremity swelling  EXAM: LOWER EXTREMITY VENOUS DOPPLER ULTRASOUND  TECHNIQUE: Gray-scale sonography with graded compression, as well as color Doppler and duplex ultrasound were performed to evaluate the lower extremity deep venous systems from the level of the common femoral vein and including the common femoral, femoral, profunda femoral, popliteal and calf veins including the posterior tibial, peroneal and gastrocnemius veins when visible. The superficial great saphenous vein was also interrogated. Spectral Doppler was utilized to evaluate flow at rest and with distal augmentation maneuvers in the common femoral, femoral and popliteal veins.  COMPARISON:  None.  FINDINGS: Common Femoral Vein: No evidence of thrombus. Normal compressibility, respiratory phasicity and response to augmentation.  Saphenofemoral Junction: No evidence of thrombus. Normal compressibility and flow on color Doppler imaging.  Profunda Femoral Vein: No evidence of thrombus. Normal compressibility and flow on color Doppler imaging.  Femoral Vein: No evidence of thrombus. Normal  compressibility, respiratory phasicity and response to augmentation.  Popliteal Vein: No evidence of thrombus. Normal compressibility, respiratory phasicity and response to augmentation.  Calf Veins: No evidence of thrombus. Normal compressibility and flow on color Doppler imaging.  Superficial Great Saphenous Vein:  No evidence of thrombus. Normal compressibility and flow on color Doppler imaging.  Venous Reflux:  None.  Other Findings:  None.  IMPRESSION: No deep venous thrombosis of the left lower extremity.   Electronically Signed   By: Kathreen Devoid   On: 04/21/2013 21:44   US Venous Img Lower Unilateral Right  04/21/2013   CLINICAL DATA:  Bilateral lower extremity swelling  EXAM: RIGHT LOWER EXTREMITY VENOUS DOPPLER ULTRASOUND  TECHNIQUE: Gray-scale sonography with graded compression, as well as color Doppler and duplex ultrasound were performed to evaluate the lower extremity deep venous systems from the level of the common femoral vein and including the common femoral, femoral, profunda femoral, popliteal and calf veins including the posterior tibial, peroneal and gastrocnemius veins when visible. The superficial great saphenous vein was also interrogated. Spectral Doppler was utilized to evaluate flow at rest and with distal augmentation maneuvers in the common femoral, femoral and popliteal veins.  COMPARISON:  None.  FINDINGS: Common Femoral Vein: No evidence of thrombus. Normal compressibility, respiratory phasicity and response to augmentation.  Saphenofemoral Junction: No evidence of thrombus. Normal compressibility and flow on color Doppler imaging.  Profunda Femoral Vein: No evidence of thrombus. Normal compressibility and flow on color Doppler imaging.  Femoral Vein: The superficial femoral vein is noncompressible with echogenic material within the vein and relative lack of Doppler flow proximally concerning for occlusive deep venous thrombosis.  Popliteal Vein: The right popliteal vein is  noncompressible with a small amount of color flow noted concerning for nonocclusive deep venous thrombosis.  Calf Veins: Not visualized.  Superficial Great Saphenous Vein: There is mural thickening of the greater saphenous vein without echogenic thrombus within the lumen. The appearance is suggestive of chronic thrombosis with recanalization.  Venous Reflux:  None.  Other Findings: There is a 4.3 x 2.7 x 6.8 cm irregular hypoechoic mass in the region of the mid right calf which may reflect a hematoma. There is no internal Doppler flow.  IMPRESSION: 1. Occlusive deep venous thrombosis of the right femoral vein. 2. Nonocclusive deep venous thrombosis of the right popliteal vein. 3. There is a 4.3 x 2.7 x 6.8 cm irregular hypoechoic mass without internal Doppler flow, in the region of the mid right calf which may reflect a hematoma.   Electronically Signed   By: Kathreen Devoid   On: 04/21/2013 21:48   Dg Chest Port 1 View  04/02/2013   CLINICAL DATA:  Hypotension. Constipation and extremity weakness. History of mesothelioma per prior radiology exams.  EXAM: PORTABLE CHEST - 1 VIEW  COMPARISON:  NM PET IMAGE RESTAG (PS) SKULL BASE TO THIGH dated 03/03/2013; CT ANGIO CHEST W/CM &/OR WO/CM dated 02/10/2013; DG RIBS BILATERAL W/CHEST dated 02/06/2013  FINDINGS: 1945 hr. Right IJ power port tip appears unchanged at the SVC right atrial level. There is stable volume loss in the right hemithorax with extensive pleural nodular thickening. Underlying interstitial prominence in both lungs has mildly increased. There is no confluent airspace opacity or significant pleural effusion. Surgical clips are noted at the thoracic inlet.  IMPRESSION: Similar overall appearance of the chest compared with available prior studies. There is mildly increased interstitial prominence in both lungs which could be related to lower lung volumes or mild edema. Nodular pleural thickening on the right remains corresponding with known mesothelioma.    Electronically Signed   By: Camie Patience M.D.   On: 04/02/2013 20:12    Microbiology: No results found for this or any previous visit (from the past  240 hour(s)).   Labs: Basic Metabolic Panel:  Recent Labs Lab 04/21/13 1942 04/22/13 0455  NA 142 141  K 4.0 4.0  CL 102 102  CO2 28 30  GLUCOSE 128* 111*  BUN 24* 24*  CREATININE 1.20 1.06  CALCIUM 8.4 8.0*   Liver Function Tests: No results found for this basename: AST, ALT, ALKPHOS, BILITOT, PROT, ALBUMIN,  in the last 168 hours No results found for this basename: LIPASE, AMYLASE,  in the last 168 hours No results found for this basename: AMMONIA,  in the last 168 hours CBC:  Recent Labs Lab 04/17/13 0922 04/21/13 1942 04/22/13 0455 04/23/13 0355 04/24/13 0743  WBC 8.8 6.9 5.6 4.5 3.3*  NEUTROABS 6.4 5.8  --   --   --   HGB 9.9* 8.1* 8.3* 7.6* 9.8*  HCT 29.9* 24.8* 24.4* 22.5* 28.4*  MCV 96 97.3 94.6 92.6 88.8  PLT 85* 105* 101* 102* 87*   Cardiac Enzymes: No results found for this basename: CKTOTAL, CKMB, CKMBINDEX, TROPONINI,  in the last 168 hours BNP: BNP (last 3 results)  Recent Labs  02/10/13 1808 04/02/13 1920  PROBNP 856.8* 1477.0*   CBG: No results found for this basename: GLUCAP,  in the last 168 hours     Signed:  Nita Sells  [facility] 04/24/2013, 9:21 AM

## 2013-04-24 NOTE — Progress Notes (Signed)
Lovenox teaching done with pt.  Attempted teach back method, but pt unable to return correct demonstration of all steps.  Home health RN needed.  Care manager and MD aware.  Garry Heater, RN 04/24/2013

## 2013-04-24 NOTE — Care Management Note (Signed)
    Page 1 of 1   04/24/2013     2:05:25 PM   CARE MANAGEMENT NOTE 04/24/2013  Patient:  DARCEL, FRANE   Account Number:  [id number]  Date Initiated:  04/22/2013  Documentation initiated by:  Dessa Phi  Subjective/Objective Assessment:   80 Y/O M ADMITTED W/R LE SWELLING.READMIT-3/25-3/30-LGIB.LY:[id number].     Action/Plan:   FROM HOME W/SPOUSE.HAS PCP,PHARMACY.   Anticipated DC Date:  04/24/2013   Anticipated DC Plan:  [place]  CM consult      Choice offered to / List presented to:  C-1 Patient        [place] arranged  HH-1 RN      [place].   Status of service:  Completed, signed off Medicare Important Message given?   (If response is "NO", the following Medicare IM given date fields will be blank) Date Medicare IM given:   Date Additional Medicare IM given:    Discharge Disposition:  [place]  Per UR Regulation:  Reviewed for med. necessity/level of care/duration of stay  If discussed at [place] of Stay Meetings, dates discussed:    Comments:  04/24/13 Eldin Bonsell RN,BSN NCM [place].[facility] REP KRISTEN AWARE OF D/C & HHRN ORDER.SPOUSE WILLING TO LEARN.  04/22/13 Jayshawn Colston RN,BSN NCM [place] AGREED TO OBSV.CC44 GIVEN TO PATIENT.PATIENT VOICED UNDERSTANDING.AWAIT DR. Marin Olp FOR TREATMENT PLAN.

## 2013-04-29 ENCOUNTER — Telehealth: Payer: Self-pay | Admitting: *Deleted

## 2013-04-29 ENCOUNTER — Telehealth: Payer: Self-pay | Admitting: Hematology & Oncology

## 2013-04-29 NOTE — Telephone Encounter (Signed)
Wife called said had note to schedule 1 week follow up. He has 5-8 appointment. Transferred to RN for triage

## 2013-04-29 NOTE — Telephone Encounter (Signed)
Daughter, Steve [name], called questioning whether or not her father needs to be seen by Dr Marin Olp. The daughter stated that the patient is "always on his oxygen at home, but he had some SOB and leg pain this weekend and that the family almost took him to the ER." Daughter states she is worried since the patient has a blood clot and is having occasional SOB. Left a note with Charmian Muff, scheduler, to set up an appointment for Thursday, April 23rd at 1:00pm if this works for the family.

## 2013-04-30 ENCOUNTER — Telehealth: Payer: Self-pay | Admitting: Hematology & Oncology

## 2013-04-30 ENCOUNTER — Ambulatory Visit: Payer: Medicare Other | Admitting: Emergency Medicine

## 2013-04-30 NOTE — Telephone Encounter (Signed)
Pt aware of 4-23

## 2013-05-01 ENCOUNTER — Ambulatory Visit (HOSPITAL_COMMUNITY)
Admission: RE | Admit: 2013-05-01 | Discharge: 2013-05-01 | Disposition: A | Discharge status: Home / Self Care | Payer: Medicare Other | Source: Ambulatory Visit | Attending: Hematology & Oncology | Admitting: Hematology & Oncology

## 2013-05-01 ENCOUNTER — Ambulatory Visit (HOSPITAL_BASED_OUTPATIENT_CLINIC_OR_DEPARTMENT_OTHER): Payer: Medicare Other | Admitting: Lab

## 2013-05-01 ENCOUNTER — Other Ambulatory Visit: Payer: Medicare Other | Admitting: Lab

## 2013-05-01 ENCOUNTER — Ambulatory Visit (HOSPITAL_BASED_OUTPATIENT_CLINIC_OR_DEPARTMENT_OTHER): Payer: Medicare Other

## 2013-05-01 ENCOUNTER — Ambulatory Visit (HOSPITAL_BASED_OUTPATIENT_CLINIC_OR_DEPARTMENT_OTHER): Payer: Medicare Other | Admitting: Hematology & Oncology

## 2013-05-01 DIAGNOSIS — D6481 Anemia due to antineoplastic chemotherapy: Secondary | ICD-10-CM

## 2013-05-01 DIAGNOSIS — C384 Malignant neoplasm of pleura: Secondary | ICD-10-CM

## 2013-05-01 DIAGNOSIS — C782 Secondary malignant neoplasm of pleura: Secondary | ICD-10-CM | POA: Insufficient documentation

## 2013-05-01 DIAGNOSIS — T451X5A Adverse effect of antineoplastic and immunosuppressive drugs, initial encounter: Principal | ICD-10-CM | POA: Insufficient documentation

## 2013-05-01 DIAGNOSIS — D6859 Other primary thrombophilia: Secondary | ICD-10-CM

## 2013-05-01 DIAGNOSIS — D649 Anemia, unspecified: Secondary | ICD-10-CM

## 2013-05-01 DIAGNOSIS — C73 Malignant neoplasm of thyroid gland: Secondary | ICD-10-CM

## 2013-05-01 DIAGNOSIS — F17200 Nicotine dependence, unspecified, uncomplicated: Secondary | ICD-10-CM

## 2013-05-01 DIAGNOSIS — E860 Dehydration: Secondary | ICD-10-CM

## 2013-05-01 DIAGNOSIS — C450 Mesothelioma of pleura: Secondary | ICD-10-CM

## 2013-05-01 DIAGNOSIS — E876 Hypokalemia: Secondary | ICD-10-CM

## 2013-05-01 DIAGNOSIS — Z86718 Personal history of other venous thrombosis and embolism: Secondary | ICD-10-CM

## 2013-05-01 DIAGNOSIS — C3490 Malignant neoplasm of unspecified part of unspecified bronchus or lung: Secondary | ICD-10-CM | POA: Insufficient documentation

## 2013-05-01 LAB — CBC WITH DIFFERENTIAL (CANCER CENTER ONLY)
BASO#: 0 10*3/uL (ref 0.0–0.2)
BASO%: 0 % (ref 0.0–2.0)
EOS%: 0.2 % (ref 0.0–7.0)
Eosinophils Absolute: 0 10*3/uL (ref 0.0–0.5)
HCT: 30.1 % — ABNORMAL LOW (ref 38.7–49.9)
HGB: 9.9 g/dL — ABNORMAL LOW (ref 13.0–17.1)
LYMPH#: 0.9 10*3/uL (ref 0.9–3.3)
LYMPH%: 19.5 % (ref 14.0–48.0)
MCH: 31.3 pg (ref 28.0–33.4)
MCHC: 32.9 g/dL (ref 32.0–35.9)
MCV: 95 fL (ref 82–98)
MONO#: 0.5 10*3/uL (ref 0.1–0.9)
MONO%: 9.7 % (ref 0.0–13.0)
NEUT#: 3.4 10*3/uL (ref 1.5–6.5)
NEUT%: 70.6 % (ref 40.0–80.0)
Platelets: 33 10*3/uL — ABNORMAL LOW (ref 145–400)
RBC: 3.16 10*6/uL — ABNORMAL LOW (ref 4.20–5.70)
RDW: 15.9 % — ABNORMAL HIGH (ref 11.1–15.7)
WBC: 4.8 10*3/uL (ref 4.0–10.0)

## 2013-05-01 LAB — CMP (CANCER CENTER ONLY)
ALT: 39 U/L (ref 10–47)
AST: 29 U/L (ref 11–38)
Albumin: 2.9 g/dL — ABNORMAL LOW (ref 3.3–5.5)
Alkaline Phosphatase: 123 U/L — ABNORMAL HIGH (ref 26–84)
BUN: 24 mg/dL — AB (ref 7–22)
CALCIUM: 8.8 mg/dL (ref 8.0–10.3)
CHLORIDE: 97 meq/L — AB (ref 98–108)
CO2: 29 meq/L (ref 18–33)
Creat: 1.1 mg/dl (ref 0.6–1.2)
Glucose, Bld: 136 mg/dL — ABNORMAL HIGH (ref 73–118)
Potassium: 3.3 mEq/L (ref 3.3–4.7)
Sodium: 138 mEq/L (ref 128–145)
Total Bilirubin: 0.5 mg/dl (ref 0.20–1.60)
Total Protein: 7.9 g/dL (ref 6.4–8.1)

## 2013-05-01 LAB — HOLD TUBE, BLOOD BANK - CHCC SATELLITE

## 2013-05-01 MED ORDER — DEXAMETHASONE SODIUM PHOSPHATE 20 MG/5ML IJ SOLN
INTRAMUSCULAR | Status: AC
  Filled 2013-05-01: qty 5

## 2013-05-01 MED ORDER — HEPARIN SOD (PORK) LOCK FLUSH 100 UNIT/ML IV SOLN
500.0000 [IU] | Freq: Once | INTRAVENOUS | Status: AC
  Administered 2013-05-01: 500 [IU] via INTRAVENOUS
  Filled 2013-05-01: qty 5

## 2013-05-01 MED ORDER — SODIUM CHLORIDE 0.9 % IV SOLN
INTRAVENOUS | Status: DC
  Administered 2013-05-01: 15:00:00 via INTRAVENOUS

## 2013-05-01 MED ORDER — DEXAMETHASONE SODIUM PHOSPHATE 20 MG/5ML IJ SOLN
20.0000 mg | Freq: Once | INTRAMUSCULAR | Status: AC
  Administered 2013-05-01: 20 mg via INTRAVENOUS

## 2013-05-01 MED ORDER — SODIUM CHLORIDE 0.9 % IJ SOLN
10.0000 mL | INTRAMUSCULAR | Status: DC | PRN
  Administered 2013-05-01: 10 mL via INTRAVENOUS
  Filled 2013-05-01: qty 10

## 2013-05-01 NOTE — Progress Notes (Signed)
Hematology and Oncology Follow Up Visit  Steve [name] [id number] May 05, 1933 80 y.o. 05/01/2013   Principle Diagnosis:  Mesothelioma of the right lung (stage III). 2. Heterozygote factor V Leiden mutation. 3. Lupus anticoagulant positive. 4. Deep venous thrombosis of the right leg. 5. 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  Current Therapy:    Lovenox 120 mg subcutaneous daily  Status post 5 cycles of carboplatinum/Alimta     Interim History:  Steve [name] is back for followup. Unfortunately he was hospitalized recently. He had recurrence of the thrombus in his right leg. He was off his anticoagulation only for about 3 weeks or so. He did have a IVC filter placed, thankfully, at that time as he wasn't bleeding and we needed to keep him off anticoagulation. Unfortunately it did not take long for him to have another clot. He has a clear hypercoagulable state. He now is back on Lovenox. His right leg feels better.  He is not doing as well. He is in a wheelchair. He is weak. He is not eating much. He is very tired. I think is dehydrated.  He really is not a candidate for any further chemotherapy. I have cut his dose his back twice. He will with this, he's had a very hard time.  He still smoking. Thankfully he is not smoking as much.  He's had no bleeding. He's had no shortness of breath. He's had occasional headache. There is no fever.  Medications: Current outpatient prescriptions:albuterol (PROVENTIL HFA;VENTOLIN HFA) 108 (90 BASE) MCG/ACT inhaler, Inhale 1 puff into the lungs every 6 (six) hours as needed for shortness of breath., Disp: , Rfl: ;  albuterol (PROVENTIL) (2.5 MG/3ML) 0.083% nebulizer solution, Take 3 mLs (2.5 mg total) by nebulization every 6 (six) hours as needed for wheezing., Disp: 360 mL, Rfl: 6 budesonide-formoterol (SYMBICORT) 160-4.5 MCG/ACT inhaler, Inhale 2 puffs into the lungs 2 (two) times daily., Disp: 1 Inhaler, Rfl: 6;  calcium carbonate  (OS-CAL - DOSED IN MG OF ELEMENTAL CALCIUM) 1250 MG tablet, Take 2 tablets by mouth 2 (two) times daily., Disp: , Rfl: ;  Cholecalciferol (VITAMIN D3) 1000 UNITS CAPS, Take 1 capsule by mouth daily., Disp: , Rfl:  enoxaparin (LOVENOX) 120 MG/0.8ML injection, Inject 0.8 mLs (120 mg total) into the skin daily., Disp: 30 Syringe, Rfl: 0;  folic acid (FOLVITE) 1 MG tablet, Take 1 mg by mouth daily., Disp: , Rfl: ;  levothyroxine (SYNTHROID, LEVOTHROID) 150 MCG tablet, Take 150 mcg by mouth daily before breakfast., Disp: , Rfl: ;  lidocaine-prilocaine (EMLA) cream, Apply 1 application topically as needed., Disp: 30 g, Rfl: 3 lisinopril (PRINIVIL,ZESTRIL) 10 MG tablet, Take 10 mg by mouth 2 (two) times daily., Disp: , Rfl: ;  losartan (COZAAR) 25 MG tablet, Take 25 mg by mouth daily., Disp: , Rfl: ;  metoprolol tartrate (LOPRESSOR) 25 MG tablet, Take 12.5 mg by mouth 2 (two) times daily., Disp: , Rfl: ;  nicotine (NICODERM CQ - DOSED IN MG/24 HOURS) 21 mg/24hr patch, Place 1 patch (21 mg total) onto the skin daily., Disp: 28 patch, Rfl: 0 polyethylene glycol (MIRALAX) packet, Take 17 g by mouth 2 (two) times daily., Disp: 14 each, Rfl: 3;  prochlorperazine (COMPAZINE) 10 MG tablet, Take 10 mg by mouth every 8 (eight) hours as needed. , Disp: , Rfl: ;  senna (SENOKOT) 8.6 MG TABS, Take 2 tablets by mouth at bedtime., Disp: , Rfl: ;  sertraline (ZOLOFT) 100 MG tablet, Take 100 mg by mouth daily with  breakfast., Disp: , Rfl:  SPIRIVA HANDIHALER 18 MCG inhalation capsule, Place 18 mcg into inhaler and inhale every morning. , Disp: , Rfl: ;  traMADol (ULTRAM) 50 MG tablet, Take 50 mg by mouth every 6 (six) hours as needed for moderate pain or severe pain., Disp: , Rfl:  Current facility-administered medications:0.9 %  sodium chloride infusion, , Intravenous, Continuous, Volanda Napoleon, MD Facility-Administered Medications Ordered in Other Visits: sodium chloride 0.9 % injection 10 mL, 10 mL, Intravenous, PRN, Volanda Napoleon, MD, 10 mL at 05/01/13 1630  Allergies:  Allergies  Allergen Reactions  . Lyrica [Pregabalin] Hives, Itching and Rash    Past Medical History, Surgical history, Social history, and Family History were reviewed and updated.  Review of Systems: As above  Physical Exam:  vitals were not taken for this visit.  Somewhat chronically ill appearing gentleman. His vital signs show a temperature of 97 4. Pulse 60. Blood pressure 120/59. Weight is 191 pounds. Head and neck exam shows no ocular or oral lesions. There is no adenopathy in the neck. He has dry oral mucosa. There is no scleral icterus. Lungs show decreased breath sounds throughout all lung fields. Cardiac exam regular rate and rhythm with no murmurs rubs or bruits. Abdomen is soft. Has good bowel sounds. Is no fluid wave. Is no palpable liver or spleen. Extremities shows chronic 1+ edema in his right leg. Left leg is unremarkable. He has 3+/5 strength in his legs. He has a 4+/5 strength in his arms. Skin exam shows some scattered ecchymosis or petechia. Neurological exam shows no focal neurological deficits.  Lab Results  Component Value Date   WBC 4.8 05/01/2013   HGB 9.9* 05/01/2013   HCT 30.1* 05/01/2013   MCV 95 05/01/2013   PLT 33* 05/01/2013     Chemistry      Component Value Date/Time   NA 138 05/01/2013 1312   NA 141 04/22/2013 0455   NA 146* 04/14/2013 1312   K 3.3 05/01/2013 1312   K 4.0 04/22/2013 0455   K 4.0 04/14/2013 1312   CL 97* 05/01/2013 1312   CL 102 04/22/2013 0455   CO2 29 05/01/2013 1312   CO2 30 04/22/2013 0455   CO2 29 04/14/2013 1312   BUN 24* 05/01/2013 1312   BUN 24* 04/22/2013 0455   BUN 21.2 04/14/2013 1312   CREATININE 1.1 05/01/2013 1312   CREATININE 1.06 04/22/2013 0455   CREATININE 1.0 04/14/2013 1312      Component Value Date/Time   CALCIUM 8.8 05/01/2013 1312   CALCIUM 8.0* 04/22/2013 0455   CALCIUM 8.8 04/14/2013 1312   ALKPHOS 123* 05/01/2013 1312   ALKPHOS 75 04/14/2013 1312   ALKPHOS 73 04/03/2013  0613   AST 29 05/01/2013 1312   AST 17 04/14/2013 1312   AST 18 04/03/2013 0613   ALT 39 05/01/2013 1312   ALT 26 04/14/2013 1312   ALT 20 04/03/2013 0613   BILITOT 0.50 05/01/2013 1312   BILITOT 0.43 04/14/2013 1312   BILITOT 0.4 04/03/2013 2010         Impression and Plan: Steve [name] is 80 year old gentleman. He has a multiple hematologic and oncologic issues. The main problems are the mesothelioma and his hypercoagulable state. He is not a candidate for any further chemotherapy. We will do another PET scan on him in about 4-6 weeks.  I really think that he is going to need a blood transfusion. I think his hemoglobin is artificially high because he is dehydrated.  I will give IV fluids today. His potassium is a little low. I told his wife to give him a couple of her potassium tonight.  I does feel bad that he has had a tough time with therapy. Having these thromboembolic disease does not make it any easier.  His performance status is ECOG t 2-3. Hopefully he will get better with fluids and blood transfusion.  I spent about 45 minutes with him today. His family was with him. There are some nights. They're very supportive. There are grateful for the care that we're trying to give him.  I want to see him back in about 2 or 3 weeks just so we can followup and hopefully get his performance status better.   Volanda Napoleon, MD 4/23/20157:02 PM

## 2013-05-01 NOTE — Progress Notes (Signed)
Explained the importance of keeping dressing to port site clean and dry.  To receive 2 units of blood tomorrow. Needle was left in.

## 2013-05-01 NOTE — Patient Instructions (Addendum)
Anemia, Nonspecific Anemia is a condition in which the concentration of red blood cells or hemoglobin in the blood is below normal. Hemoglobin is a substance in red blood cells that carries oxygen to the tissues of the body. Anemia results in not enough oxygen reaching these tissues.  CAUSES  Common causes of anemia include:   Excessive bleeding. Bleeding may be internal or external. This includes excessive bleeding from periods (in women) or from the intestine.   Poor nutrition.   Chronic kidney, thyroid, and liver disease.  Bone marrow disorders that decrease red blood cell production.  Cancer and treatments for cancer.  HIV, AIDS, and their treatments.  Spleen problems that increase red blood cell destruction.  Blood disorders.  Excess destruction of red blood cells due to infection, medicines, and autoimmune disorders. SIGNS AND SYMPTOMS   Minor weakness.   Dizziness.   Headache.  Palpitations.   Shortness of breath, especially with exercise.   Paleness.  Cold sensitivity.  Indigestion.  Nausea.  Difficulty sleeping.  Difficulty concentrating. Symptoms may occur suddenly or they may develop slowly.  DIAGNOSIS  Additional blood tests are often needed. These help your health care provider determine the best treatment. Your health care provider will check your stool for blood and look for other causes of blood loss.  TREATMENT  Treatment varies depending on the cause of the anemia. Treatment can include:   Supplements of iron, vitamin J67, or folic acid.   Hormone medicines.   A blood transfusion. This may be needed if blood loss is severe.   Hospitalization. This may be needed if there is significant continual blood loss.   Dietary changes.  Spleen removal. HOME CARE INSTRUCTIONS Keep all follow-up appointments. It often takes many weeks to correct anemia, and having your health care provider check on your condition and your response to  treatment is very important. SEEK IMMEDIATE MEDICAL CARE IF:   You develop extreme weakness, shortness of breath, or chest pain.   You become dizzy or have trouble concentrating.  You develop heavy vaginal bleeding.   You develop a rash.   You have bloody or black, tarry stools.   You faint.   You vomit up blood.   You vomit repeatedly.   You have abdominal pain.  You have a fever or persistent symptoms for more than 2 3 days.   You have a fever and your symptoms suddenly get worse.   You are dehydrated.  MAKE SURE YOU:  Understand these instructions.  Will watch your condition.  Will get help right away if you are not doing well or get worse. Document Released: 02/03/2004 Document Revised: 08/28/2012 Document Reviewed: 06/21/2012 [facility] Patient Information 2014 [place].Implanted [facility] Guide An implanted port is a type of central line that is placed under the skin. Central lines are used to provide IV access when treatment or nutrition needs to be given through a person's veins. Implanted ports are used for long-term IV access. An implanted port may be placed because:   You need IV medicine that would be irritating to the small veins in your hands or arms.   You need long-term IV medicines, such as antibiotics.   You need IV nutrition for a long period.   You need frequent blood draws for lab tests.   You need dialysis.  Implanted ports are usually placed in the chest area, but they can also be placed in the upper arm, the abdomen, or the leg. An implanted port has two  main parts:   Reservoir. The reservoir is round and will appear as a small, raised area under your skin. The reservoir is the part where a needle is inserted to give medicines or draw blood.   Catheter. The catheter is a thin, flexible tube that extends from the reservoir. The catheter is placed into a large vein. Medicine that is inserted into the reservoir goes into the  catheter and then into the vein.  HOW WILL I CARE FOR MY INCISION SITE? Do not get the incision site wet. Bathe or shower as directed by your health care provider.  HOW IS MY PORT ACCESSED? Special steps must be taken to access the port:   Before the port is accessed, a numbing cream can be placed on the skin. This helps numb the skin over the port site.   Your health care provider uses a sterile technique to access the port.  Your health care provider must put on a mask and sterile gloves.  The skin over your port is cleaned carefully with an antiseptic and allowed to dry.  The port is gently pinched between sterile gloves, and a needle is inserted into the port.  Only "non-coring" port needles should be used to access the port. Once the port is accessed, a blood return should be checked. This helps ensure that the port is in the vein and is not clogged.   If your port needs to remain accessed for a constant infusion, a clear (transparent) bandage will be placed over the needle site. The bandage and needle will need to be changed every week, or as directed by your health care provider.   Keep the bandage covering the needle clean and dry. Do not get it wet. Follow your health care provider's instructions on how to take a shower or bath while the port is accessed.   If your port does not need to stay accessed, no bandage is needed over the port.  WHAT IS FLUSHING? Flushing helps keep the port from getting clogged. Follow your health care provider's instructions on how and when to flush the port. Ports are usually flushed with saline solution or a medicine called heparin. The need for flushing will depend on how the port is used.   If the port is used for intermittent medicines or blood draws, the port will need to be flushed:   After medicines have been given.   After blood has been drawn.   As part of routine maintenance.   If a constant infusion is running, the port may  not need to be flushed.  HOW LONG WILL MY PORT STAY IMPLANTED? The port can stay in for as long as your health care provider thinks it is needed. When it is time for the port to come out, surgery will be done to remove it. The procedure is similar to the one performed when the port was put in.  WHEN SHOULD I SEEK IMMEDIATE MEDICAL CARE? When you have an implanted port, you should seek immediate medical care if:   You notice a bad smell coming from the incision site.   You have swelling, redness, or drainage at the incision site.   You have more swelling or pain at the port site or the surrounding area.   You have a fever that is not controlled with medicine. Document Released: 12/26/2004 Document Revised: 10/16/2012 Document Reviewed: 09/02/2012 [facility] Patient Information 2014 [place].

## 2013-05-02 ENCOUNTER — Encounter: Payer: Self-pay | Admitting: Hematology & Oncology

## 2013-05-02 ENCOUNTER — Other Ambulatory Visit (HOSPITAL_BASED_OUTPATIENT_CLINIC_OR_DEPARTMENT_OTHER): Payer: Medicare Other | Admitting: Lab

## 2013-05-02 ENCOUNTER — Ambulatory Visit (HOSPITAL_BASED_OUTPATIENT_CLINIC_OR_DEPARTMENT_OTHER): Payer: Medicare Other

## 2013-05-02 ENCOUNTER — Other Ambulatory Visit: Payer: Self-pay | Admitting: Oncology

## 2013-05-02 VITALS — BP 187/83 | HR 64 | Temp 97.4°F | Resp 20

## 2013-05-02 DIAGNOSIS — D6481 Anemia due to antineoplastic chemotherapy: Secondary | ICD-10-CM

## 2013-05-02 DIAGNOSIS — T451X5A Adverse effect of antineoplastic and immunosuppressive drugs, initial encounter: Principal | ICD-10-CM

## 2013-05-02 DIAGNOSIS — C384 Malignant neoplasm of pleura: Secondary | ICD-10-CM

## 2013-05-02 DIAGNOSIS — C3490 Malignant neoplasm of unspecified part of unspecified bronchus or lung: Secondary | ICD-10-CM

## 2013-05-02 DIAGNOSIS — C450 Mesothelioma of pleura: Secondary | ICD-10-CM

## 2013-05-02 LAB — BASIC METABOLIC PANEL - CANCER CENTER ONLY
BUN, Bld: 20 mg/dL (ref 7–22)
CALCIUM: 8.4 mg/dL (ref 8.0–10.3)
CO2: 29 mEq/L (ref 18–33)
Chloride: 104 mEq/L (ref 98–108)
Creat: 0.9 mg/dl (ref 0.6–1.2)
GLUCOSE: 136 mg/dL — AB (ref 73–118)
Potassium: 3.7 mEq/L (ref 3.3–4.7)
Sodium: 143 mEq/L (ref 128–145)

## 2013-05-02 LAB — PREPARE RBC (CROSSMATCH)

## 2013-05-02 MED ORDER — ACETAMINOPHEN 325 MG PO TABS
ORAL_TABLET | ORAL | Status: AC
  Filled 2013-05-02: qty 2

## 2013-05-02 MED ORDER — ACETAMINOPHEN 325 MG PO TABS
650.0000 mg | ORAL_TABLET | Freq: Once | ORAL | Status: AC
  Administered 2013-05-02: 650 mg via ORAL

## 2013-05-02 MED ORDER — SODIUM CHLORIDE 0.9 % IJ SOLN
10.0000 mL | INTRAMUSCULAR | Status: AC | PRN
  Administered 2013-05-02: 10 mL
  Filled 2013-05-02: qty 10

## 2013-05-02 MED ORDER — FUROSEMIDE 10 MG/ML IJ SOLN
INTRAMUSCULAR | Status: AC
  Filled 2013-05-02: qty 4

## 2013-05-02 MED ORDER — SODIUM CHLORIDE 0.9 % IV SOLN
250.0000 mL | Freq: Once | INTRAVENOUS | Status: AC
  Administered 2013-05-02: 09:00:00 via INTRAVENOUS

## 2013-05-02 MED ORDER — HEPARIN SOD (PORK) LOCK FLUSH 100 UNIT/ML IV SOLN
500.0000 [IU] | Freq: Every day | INTRAVENOUS | Status: AC | PRN
  Administered 2013-05-02: 500 [IU]
  Filled 2013-05-02: qty 5

## 2013-05-02 MED ORDER — FUROSEMIDE 10 MG/ML IJ SOLN
20.0000 mg | Freq: Once | INTRAMUSCULAR | Status: AC
  Administered 2013-05-02: 20 mg via INTRAVENOUS

## 2013-05-02 NOTE — Patient Instructions (Signed)
Blood Transfusion  A blood transfusion replaces your blood or some of its parts. Blood is replaced when you have lost blood because of surgery, an accident, or for severe blood conditions like anemia. You can donate blood to be used on yourself if you have a planned surgery. If you lose blood during that surgery, your own blood can be given back to you. Any blood given to you is checked to make sure it matches your blood type. Your temperature, blood pressure, and heart rate (vital signs) will be checked often.  GET HELP RIGHT AWAY IF:   You feel sick to your stomach (nauseous) or throw up (vomit).  You have watery poop (diarrhea).  You have shortness of breath or trouble breathing.  You have blood in your pee (urine) or have dark colored pee.  You have chest pain or tightness.  Your eyes or skin turn yellow (jaundice).  You have a temperature by mouth above 102 F (38.9 C), not controlled by medicine.  You start to shake and have chills.  You develop a a red rash (hives) or feel itchy.  You develop lightheadedness or feel confused.  You develop back, joint, or muscle pain.  You do not feel hungry (lost appetite).  You feel tired, restless, or nervous.  You develop belly (abdominal) cramps. Document Released: 03/24/2008 Document Revised: 03/20/2011 Document Reviewed: 03/24/2008 Exit[employer] Patient Information 2014 [employer].  

## 2013-05-03 LAB — TYPE AND SCREEN
ABO/RH(D): A POS
ANTIBODY SCREEN: NEGATIVE
Unit division: 0
Unit division: 0

## 2013-05-06 LAB — PROTEIN ELECTROPHORESIS, SERUM, WITH REFLEX
ALPHA-1-GLOBULIN: 4.8 % (ref 2.9–4.9)
ALPHA-2-GLOBULIN: 13.5 % — AB (ref 7.1–11.8)
Albumin ELP: 48.5 % — ABNORMAL LOW (ref 55.8–66.1)
BETA 2: 5.2 % (ref 3.2–6.5)
BETA GLOBULIN: 18.5 % — AB (ref 4.7–7.2)
GAMMA GLOBULIN: 9.5 % — AB (ref 11.1–18.8)
M-Spike, %: 0.83 g/dL
Total Protein, Serum Electrophoresis: 7 g/dL (ref 6.0–8.3)

## 2013-05-06 LAB — IGG, IGA, IGM
IGA: 1080 mg/dL — AB (ref 68–379)
IGG (IMMUNOGLOBIN G), SERUM: 660 mg/dL (ref 650–1600)
IgM, Serum: 68 mg/dL (ref 41–251)

## 2013-05-06 LAB — IFE INTERPRETATION

## 2013-05-06 LAB — KAPPA/LAMBDA LIGHT CHAINS
Kappa free light chain: 2.18 mg/dL — ABNORMAL HIGH (ref 0.33–1.94)
Kappa:Lambda Ratio: 0.28 (ref 0.26–1.65)
Lambda Free Lght Chn: 7.65 mg/dL — ABNORMAL HIGH (ref 0.57–2.63)

## 2013-05-06 LAB — PREALBUMIN: Prealbumin: 15.8 mg/dL — ABNORMAL LOW (ref 17.0–34.0)

## 2013-05-08 ENCOUNTER — Other Ambulatory Visit: Payer: Medicare Other | Admitting: Lab

## 2013-05-08 ENCOUNTER — Ambulatory Visit: Payer: Medicare Other

## 2013-05-08 ENCOUNTER — Ambulatory Visit: Payer: Medicare Other | Admitting: Hematology & Oncology

## 2013-05-16 ENCOUNTER — Other Ambulatory Visit: Payer: Medicare Other | Admitting: Lab

## 2013-05-16 ENCOUNTER — Ambulatory Visit: Payer: Medicare Other

## 2013-05-16 ENCOUNTER — Ambulatory Visit: Payer: Medicare Other | Admitting: Hematology & Oncology

## 2013-05-19 ENCOUNTER — Other Ambulatory Visit: Payer: Self-pay | Admitting: Nurse Practitioner

## 2013-05-19 ENCOUNTER — Encounter: Payer: Self-pay | Admitting: Nurse Practitioner

## 2013-05-19 DIAGNOSIS — C450 Mesothelioma of pleura: Secondary | ICD-10-CM

## 2013-05-19 MED ORDER — ENOXAPARIN SODIUM 120 MG/0.8ML ~~LOC~~ SOLN: 120.0000 mg | SUBCUTANEOUS | Status: DC

## 2013-05-19 NOTE — Telephone Encounter (Signed)
Per Dr. Marin Olp wife was instructed to continue giving him the Lovenox.

## 2013-05-20 ENCOUNTER — Ambulatory Visit: Payer: Medicare Other | Admitting: Hematology & Oncology

## 2013-05-21 ENCOUNTER — Encounter: Payer: Self-pay | Admitting: Nurse Practitioner

## 2013-05-27 ENCOUNTER — Telehealth: Payer: Self-pay | Admitting: *Deleted

## 2013-05-27 NOTE — Telephone Encounter (Signed)
Received voicemail from [facility] stating pt had fallen at home. Nicki Reaper stated he went to visit pt after the fall and that no bruising was present, no flail chest present, and that vitals were WNL. Vitals: 92% oxygen saturation on 2L oxygen, heart rate 68, BP 122/78. Nicki Reaper stated he wanted to make office aware of pt's fall. Pt has appt on May 30, 2013.

## 2013-05-29 ENCOUNTER — Other Ambulatory Visit: Payer: Self-pay | Admitting: Family Medicine

## 2013-05-30 ENCOUNTER — Encounter: Payer: Self-pay | Admitting: Hematology & Oncology

## 2013-05-30 ENCOUNTER — Encounter: Payer: Self-pay | Admitting: Nurse Practitioner

## 2013-05-30 ENCOUNTER — Ambulatory Visit (HOSPITAL_BASED_OUTPATIENT_CLINIC_OR_DEPARTMENT_OTHER): Payer: Medicare Other | Admitting: Hematology & Oncology

## 2013-05-30 ENCOUNTER — Ambulatory Visit (HOSPITAL_BASED_OUTPATIENT_CLINIC_OR_DEPARTMENT_OTHER): Payer: Medicare Other | Admitting: Lab

## 2013-05-30 VITALS — BP 140/76 | HR 50 | Temp 96.8°F | Resp 16 | Wt 197.0 lb

## 2013-05-30 DIAGNOSIS — T451X5A Adverse effect of antineoplastic and immunosuppressive drugs, initial encounter: Principal | ICD-10-CM

## 2013-05-30 DIAGNOSIS — D649 Anemia, unspecified: Secondary | ICD-10-CM

## 2013-05-30 DIAGNOSIS — D6481 Anemia due to antineoplastic chemotherapy: Secondary | ICD-10-CM

## 2013-05-30 DIAGNOSIS — Z86718 Personal history of other venous thrombosis and embolism: Secondary | ICD-10-CM

## 2013-05-30 DIAGNOSIS — C450 Mesothelioma of pleura: Secondary | ICD-10-CM

## 2013-05-30 DIAGNOSIS — C73 Malignant neoplasm of thyroid gland: Secondary | ICD-10-CM

## 2013-05-30 DIAGNOSIS — F17200 Nicotine dependence, unspecified, uncomplicated: Secondary | ICD-10-CM

## 2013-05-30 DIAGNOSIS — C384 Malignant neoplasm of pleura: Secondary | ICD-10-CM

## 2013-05-30 DIAGNOSIS — D472 Monoclonal gammopathy: Secondary | ICD-10-CM

## 2013-05-30 LAB — CBC WITH DIFFERENTIAL (CANCER CENTER ONLY)
BASO#: 0 10*3/uL (ref 0.0–0.2)
BASO%: 0.1 % (ref 0.0–2.0)
EOS ABS: 0.1 10*3/uL (ref 0.0–0.5)
EOS%: 1.9 % (ref 0.0–7.0)
HCT: 34.7 % — ABNORMAL LOW (ref 38.7–49.9)
HGB: 11.3 g/dL — ABNORMAL LOW (ref 13.0–17.1)
LYMPH#: 1.3 10*3/uL (ref 0.9–3.3)
LYMPH%: 17.9 % (ref 14.0–48.0)
MCH: 32.9 pg (ref 28.0–33.4)
MCHC: 32.6 g/dL (ref 32.0–35.9)
MCV: 101 fL — AB (ref 82–98)
MONO#: 0.7 10*3/uL (ref 0.1–0.9)
MONO%: 9.5 % (ref 0.0–13.0)
NEUT#: 5.1 10*3/uL (ref 1.5–6.5)
NEUT%: 70.6 % (ref 40.0–80.0)
Platelets: 128 10*3/uL — ABNORMAL LOW (ref 145–400)
RBC: 3.43 10*6/uL — ABNORMAL LOW (ref 4.20–5.70)
RDW: 20.4 % — AB (ref 11.1–15.7)
WBC: 7.3 10*3/uL (ref 4.0–10.0)

## 2013-05-30 LAB — HOLD TUBE, BLOOD BANK - CHCC SATELLITE

## 2013-05-30 MED ORDER — HYDROCODONE-ACETAMINOPHEN 5-325 MG PO TABS: 1.0000 | ORAL_TABLET | Freq: Four times a day (QID) | ORAL | Status: DC | PRN

## 2013-05-30 NOTE — Progress Notes (Signed)
I checked on the status of his pt assistance application that was completed on 05/19/13 through [employer] for Lovenox assistance. Per representative it is still under review and we should hear back his approval by the week of May 26. Dr. Marin Olp is aware and so is pt.

## 2013-06-01 NOTE — Progress Notes (Signed)
Hematology and Oncology Follow Up Visit  Steve [name] [id number] 1934/01/02 80 y.o. 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 2. Heterozygote factor V Leiden mutation. 3. Lupus anticoagulant positive. 4. Deep venous thrombosis of the right leg. 5. 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   Current Therapy:    Lovenox 120 mg subcutaneous daily  Status post 5 cycles of carboplatinum/Alimta     Interim History:  [name] is back for followup. He fell at home. He fell onto his right side. He has some tenderness in the mid lateral rib cage. There's been no swelling.  He is on Lovenox. He does have a IVC filter in. He clearly is hypercoagulable. One so we had him off anticoagulation because of bleeding, he proceeded to develop another clot in his right leg. 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  He continues to smoke quite a bit. He has significant risk factors for continued hypercoagulability.  He is not complaining of any shortness of breath. He's had no chest wall pain. He's had no cough. He's had no hemoptysis.  His appetite is improving. He's gaining weight.  He was wondering if he Xarelto that he was on causes bleeding. I told him that this may have been a factor but mostly it was his low platelet count from the chemotherapy that was the factor.    Medications: Current outpatient prescriptions:albuterol (PROVENTIL HFA;VENTOLIN HFA) 108 (90 BASE) MCG/ACT inhaler, Inhale 1 puff into the lungs every 6 (six) hours as needed for shortness of breath., Disp: , Rfl: ;  albuterol (PROVENTIL) (2.5 MG/3ML) 0.083% nebulizer solution, Take 3 mLs (2.5 mg total) by nebulization every 6 (six) hours as needed for wheezing., Disp: 360 mL, Rfl: 6 budesonide-formoterol (SYMBICORT) 160-4.5 MCG/ACT inhaler, Inhale 2 puffs into the lungs 2 (two) times daily., Disp: 1  Inhaler, Rfl: 6;  calcium carbonate (OS-CAL - DOSED IN MG OF ELEMENTAL CALCIUM) 1250 MG tablet, Take 2 tablets by mouth 2 (two) times daily., Disp: , Rfl: ;  Cholecalciferol (VITAMIN D3) 1000 UNITS CAPS, Take 1 capsule by mouth daily., Disp: , Rfl:  enoxaparin (LOVENOX) 120 MG/0.8ML injection, Inject 0.8 mLs (120 mg total) into the skin daily., Disp: 30 Syringe, Rfl: 3;  folic acid (FOLVITE) 1 MG tablet, Take 1 mg by mouth daily., Disp: , Rfl: ;  levothyroxine (SYNTHROID, LEVOTHROID) 150 MCG tablet, Take 150 mcg by mouth daily before breakfast., Disp: , Rfl: ;  lisinopril (PRINIVIL,ZESTRIL) 10 MG tablet, Take 10 mg by mouth 2 (two) times daily., Disp: , Rfl:  losartan (COZAAR) 25 MG tablet, Take 25 mg by mouth daily., Disp: , Rfl: ;  metoprolol tartrate (LOPRESSOR) 25 MG tablet, Take 12.5 mg by mouth 2 (two) times daily., Disp: , Rfl: ;  metoprolol tartrate (LOPRESSOR) 25 MG tablet, TAKE ONE-HALF TABLET BY MOUTH TWICE DAILY, Disp: 30 tablet, Rfl: 0;  polyethylene glycol (MIRALAX) packet, Take 17 g by mouth 2 (two) times daily., Disp: 14 each, Rfl: 3 senna (SENOKOT) 8.6 MG TABS, Take 2 tablets by mouth at bedtime., Disp: , Rfl: ;  sertraline (ZOLOFT) 100 MG tablet, Take 100 mg by mouth daily with breakfast., Disp: , Rfl: ;  SPIRIVA HANDIHALER 18 MCG inhalation capsule, Place 18 mcg into inhaler and inhale every morning. , Disp: , Rfl: ;  HYDROcodone-acetaminophen (NORCO/VICODIN) 5-325 MG per tablet, Take 1 tablet by mouth every 6 (  six) hours as needed for moderate pain., Disp: 60 tablet, Rfl: 0 lidocaine-prilocaine (EMLA) cream, Apply 1 application topically as needed., Disp: 30 g, Rfl: 3;  nicotine (NICODERM CQ - DOSED IN MG/24 HOURS) 21 mg/24hr patch, Place 1 patch (21 mg total) onto the skin daily., Disp: 28 patch, Rfl: 0;  prochlorperazine (COMPAZINE) 10 MG tablet, Take 10 mg by mouth every 8 (eight) hours as needed. , Disp: , Rfl:   Allergies:  Allergies  Allergen Reactions  . Lyrica [Pregabalin] Hives,  Itching and Rash    Past Medical History, Surgical history, Social history, and Family History were reviewed and updated.  Review of Systems: As above  Physical Exam:  weight is 197 lb (89.359 kg). His oral temperature is 96.8 F (36 C). His blood pressure is 140/76 and his pulse is 50. His respiration is 16.   Fairly well developed and well nourished white gentleman. His head and neck exam shows no ocular or oral lesion. Has no palpable cervical or supra-collector lymph nodes. Lungs are with decreased breath sounds over on the right side. He has a decent breath sounds on the left side. Cardiac exam regular in rhythm with no murmurs rubs or bruits. Abdomen is soft. Has good bowel sounds. There is no fluid wave. There is no palpable liver or spleen tip. Exam shows some tenderness over the posterior lateral right rib cage. No swelling is noted. No tenderness is noted over the spine. Extremities shows some slight nonpitting edema of the right leg. No obvious venous cord is noted. His decent pulses. Has good strength. Has good range of motion with point. Neurological exam is non-focal. Skin exam shows no rashes, ecchymosis or petechia.  Lab Results  Component Value Date   WBC 7.3 05/30/2013   HGB 11.3* 05/30/2013   HCT 34.7* 05/30/2013   MCV 101* 05/30/2013   PLT 128* 05/30/2013     Chemistry      Component Value Date/Time   NA 139 05/30/2013 1520   NA 143 05/02/2013 1000   NA 146* 04/14/2013 1312   K 3.9 05/30/2013 1520   K 3.7 05/02/2013 1000   K 4.0 04/14/2013 1312   CL 101 05/30/2013 1520   CL 104 05/02/2013 1000   CO2 28 05/30/2013 1520   CO2 29 05/02/2013 1000   CO2 29 04/14/2013 1312   BUN 20 05/30/2013 1520   BUN 20 05/02/2013 1000   BUN 21.2 04/14/2013 1312   CREATININE 1.41* 05/30/2013 1520   CREATININE 0.9 05/02/2013 1000   CREATININE 1.0 04/14/2013 1312      Component Value Date/Time   CALCIUM 8.7 05/30/2013 1520   CALCIUM 8.4 05/02/2013 1000   CALCIUM 8.8 04/14/2013 1312   ALKPHOS 109  05/30/2013 1520   ALKPHOS 123* 05/01/2013 1312   ALKPHOS 75 04/14/2013 1312   AST 13 05/30/2013 1520   AST 29 05/01/2013 1312   AST 17 04/14/2013 1312   ALT 12 05/30/2013 1520   ALT 39 05/01/2013 1312   ALT 26 04/14/2013 1312   BILITOT 0.5 05/30/2013 1520   BILITOT 0.50 05/01/2013 1312   BILITOT 0.43 04/14/2013 1312         Impression and Plan: Mr. Hartman is a 80 year old gentleman. He has a multiple problems. We treated the mesothelioma. He did have a tough time with treatment. He had 5 cycles and that's all that he could tolerate. He did have a response by his last PET scan. We will have to repeat this.  He really  is about quality of life. He does have some issues with the Lovenox and cost. I am sure that we can get this for him for a lot less money. My nurse is working on this.  He still smokes. This will be his biggest problem in his mortality will be based on this. He knows his. He understands this.  We will have to see what the PET scan shows. I think this will be a credibly helpful. We will give [place] in about 3 weeks.  I will see him back in about a month.  I spent about 45 minutes with he and his family. They are all very very nice. It is always interesting when they come in.   Volanda Napoleon, MD 5/24/201510:07 AM

## 2013-06-03 ENCOUNTER — Telehealth: Payer: Self-pay | Admitting: Hematology & Oncology

## 2013-06-03 LAB — IRON AND TIBC CHCC
%SAT: 51 % (ref 20–55)
Iron: 111 ug/dL (ref 42–163)
TIBC: 216 ug/dL (ref 202–409)
UIBC: 105 ug/dL — ABNORMAL LOW (ref 117–376)

## 2013-06-03 LAB — FERRITIN CHCC: FERRITIN: 856 ng/mL — AB (ref 22–316)

## 2013-06-03 NOTE — Telephone Encounter (Signed)
Pt aware of 6-23 PET and to be NPO 6 hrs and 6-30 MD

## 2013-06-04 LAB — COMPREHENSIVE METABOLIC PANEL
ALT: 12 U/L (ref 0–53)
AST: 13 U/L (ref 0–37)
Albumin: 3.3 g/dL — ABNORMAL LOW (ref 3.5–5.2)
Alkaline Phosphatase: 109 U/L (ref 39–117)
BILIRUBIN TOTAL: 0.5 mg/dL (ref 0.2–1.2)
BUN: 20 mg/dL (ref 6–23)
CO2: 28 meq/L (ref 19–32)
Calcium: 8.7 mg/dL (ref 8.4–10.5)
Chloride: 101 mEq/L (ref 96–112)
Creatinine, Ser: 1.41 mg/dL — ABNORMAL HIGH (ref 0.50–1.35)
Glucose, Bld: 99 mg/dL (ref 70–99)
Potassium: 3.9 mEq/L (ref 3.5–5.3)
Sodium: 139 mEq/L (ref 135–145)
Total Protein: 6.9 g/dL (ref 6.0–8.3)

## 2013-06-04 LAB — KAPPA/LAMBDA LIGHT CHAINS
KAPPA LAMBDA RATIO: 0.32 (ref 0.26–1.65)
Kappa free light chain: 2.44 mg/dL — ABNORMAL HIGH (ref 0.33–1.94)
Lambda Free Lght Chn: 7.72 mg/dL — ABNORMAL HIGH (ref 0.57–2.63)

## 2013-06-04 LAB — PROTEIN ELECTROPHORESIS, SERUM, WITH REFLEX
ALBUMIN ELP: 47.8 % — AB (ref 55.8–66.1)
Alpha-1-Globulin: 4.2 % (ref 2.9–4.9)
Alpha-2-Globulin: 14.5 % — ABNORMAL HIGH (ref 7.1–11.8)
BETA GLOBULIN: 17.5 % — AB (ref 4.7–7.2)
Beta 2: 5.1 % (ref 3.2–6.5)
GAMMA GLOBULIN: 10.9 % — AB (ref 11.1–18.8)
M-SPIKE, %: 0.64 g/dL
Total Protein, Serum Electrophoresis: 6.9 g/dL (ref 6.0–8.3)

## 2013-06-04 LAB — IGG, IGA, IGM
IGG (IMMUNOGLOBIN G), SERUM: 803 mg/dL (ref 650–1600)
IgA: 1100 mg/dL — ABNORMAL HIGH (ref 68–379)
IgM, Serum: 84 mg/dL (ref 41–251)

## 2013-06-04 LAB — TESTOSTERONE: Testosterone: 223 ng/dL — ABNORMAL LOW (ref 300–890)

## 2013-06-04 LAB — IFE INTERPRETATION

## 2013-06-04 LAB — LACTATE DEHYDROGENASE: LDH: 164 U/L (ref 94–250)

## 2013-06-05 ENCOUNTER — Other Ambulatory Visit: Payer: Self-pay | Admitting: Family Medicine

## 2013-06-05 DIAGNOSIS — F329 Major depressive disorder, single episode, unspecified: Secondary | ICD-10-CM

## 2013-06-05 DIAGNOSIS — F32A Depression, unspecified: Secondary | ICD-10-CM

## 2013-06-05 MED ORDER — SERTRALINE HCL 100 MG PO TABS: 150.0000 mg | ORAL_TABLET | Freq: Every day | ORAL | Status: DC

## 2013-06-09 ENCOUNTER — Telehealth: Payer: Self-pay | Admitting: Hematology & Oncology

## 2013-06-09 NOTE — Telephone Encounter (Signed)
[facility] denied LOVENOX for patient due to pt's insurance will cover this injection.     COPY SCANNED

## 2013-06-13 ENCOUNTER — Other Ambulatory Visit: Payer: Self-pay | Admitting: Family Medicine

## 2013-07-01 ENCOUNTER — Ambulatory Visit (HOSPITAL_COMMUNITY)
Admission: RE | Admit: 2013-07-01 | Discharge: 2013-07-01 | Disposition: A | Discharge status: Home / Self Care | Payer: Medicare Other | Source: Ambulatory Visit | Attending: Hematology & Oncology | Admitting: Hematology & Oncology

## 2013-07-01 ENCOUNTER — Encounter (HOSPITAL_COMMUNITY): Payer: Self-pay

## 2013-07-01 DIAGNOSIS — C450 Mesothelioma of pleura: Secondary | ICD-10-CM

## 2013-07-01 DIAGNOSIS — C384 Malignant neoplasm of pleura: Secondary | ICD-10-CM | POA: Insufficient documentation

## 2013-07-01 LAB — GLUCOSE, CAPILLARY: GLUCOSE-CAPILLARY: 117 mg/dL — AB (ref 70–99)

## 2013-07-01 MED ORDER — FLUDEOXYGLUCOSE F - 18 (FDG) INJECTION
10.8000 | Freq: Once | INTRAVENOUS | Status: AC | PRN
  Administered 2013-07-01: 10.8 via INTRAVENOUS

## 2013-07-02 ENCOUNTER — Encounter: Payer: Self-pay | Admitting: *Deleted

## 2013-07-08 ENCOUNTER — Ambulatory Visit (HOSPITAL_BASED_OUTPATIENT_CLINIC_OR_DEPARTMENT_OTHER): Payer: Medicare Other | Admitting: Lab

## 2013-07-08 ENCOUNTER — Ambulatory Visit (HOSPITAL_BASED_OUTPATIENT_CLINIC_OR_DEPARTMENT_OTHER): Payer: Medicare Other | Admitting: Hematology & Oncology

## 2013-07-08 ENCOUNTER — Encounter: Payer: Self-pay | Admitting: Hematology & Oncology

## 2013-07-08 VITALS — BP 142/60 | HR 82 | Temp 98.8°F | Resp 20 | Ht 68.0 in | Wt 197.0 lb

## 2013-07-08 DIAGNOSIS — C3490 Malignant neoplasm of unspecified part of unspecified bronchus or lung: Secondary | ICD-10-CM

## 2013-07-08 DIAGNOSIS — F17200 Nicotine dependence, unspecified, uncomplicated: Secondary | ICD-10-CM

## 2013-07-08 DIAGNOSIS — C450 Mesothelioma of pleura: Secondary | ICD-10-CM

## 2013-07-08 DIAGNOSIS — Z86718 Personal history of other venous thrombosis and embolism: Secondary | ICD-10-CM

## 2013-07-08 DIAGNOSIS — R269 Unspecified abnormalities of gait and mobility: Secondary | ICD-10-CM

## 2013-07-08 DIAGNOSIS — R109 Unspecified abdominal pain: Secondary | ICD-10-CM

## 2013-07-08 LAB — CMP (CANCER CENTER ONLY)
ALT: 21 U/L (ref 10–47)
AST: 21 U/L (ref 11–38)
Albumin: 3.3 g/dL (ref 3.3–5.5)
Alkaline Phosphatase: 84 U/L (ref 26–84)
BILIRUBIN TOTAL: 0.5 mg/dL (ref 0.20–1.60)
BUN, Bld: 20 mg/dL (ref 7–22)
CHLORIDE: 102 meq/L (ref 98–108)
CO2: 29 meq/L (ref 18–33)
Calcium: 9.5 mg/dL (ref 8.0–10.3)
Creat: 1.1 mg/dl (ref 0.6–1.2)
Glucose, Bld: 101 mg/dL (ref 73–118)
Potassium: 4.2 mEq/L (ref 3.3–4.7)
SODIUM: 146 meq/L — AB (ref 128–145)
TOTAL PROTEIN: 8.2 g/dL — AB (ref 6.4–8.1)

## 2013-07-08 LAB — CBC WITH DIFFERENTIAL (CANCER CENTER ONLY)
BASO#: 0 10*3/uL (ref 0.0–0.2)
BASO%: 0.1 % (ref 0.0–2.0)
EOS%: 1.5 % (ref 0.0–7.0)
Eosinophils Absolute: 0.1 10*3/uL (ref 0.0–0.5)
HEMATOCRIT: 38.9 % (ref 38.7–49.9)
HGB: 12.9 g/dL — ABNORMAL LOW (ref 13.0–17.1)
LYMPH#: 1.5 10*3/uL (ref 0.9–3.3)
LYMPH%: 22.2 % (ref 14.0–48.0)
MCH: 34.7 pg — ABNORMAL HIGH (ref 28.0–33.4)
MCHC: 33.2 g/dL (ref 32.0–35.9)
MCV: 105 fL — ABNORMAL HIGH (ref 82–98)
MONO#: 0.4 10*3/uL (ref 0.1–0.9)
MONO%: 5.4 % (ref 0.0–13.0)
NEUT%: 70.8 % (ref 40.0–80.0)
NEUTROS ABS: 4.8 10*3/uL (ref 1.5–6.5)
Platelets: 121 10*3/uL — ABNORMAL LOW (ref 145–400)
RBC: 3.72 10*6/uL — ABNORMAL LOW (ref 4.20–5.70)
RDW: 16.6 % — ABNORMAL HIGH (ref 11.1–15.7)
WBC: 6.7 10*3/uL (ref 4.0–10.0)

## 2013-07-08 NOTE — Progress Notes (Signed)
Hematology and Oncology Follow Up Visit  Steve [name] [id number] 1933/02/26 80 y.o. 07/08/2013   Principle Diagnosis:  Mesothelioma of the right lung (stage III). 2. Heterozygote factor V Leiden mutation. 3. Lupus anticoagulant positive. 4. Deep venous thrombosis of the right leg. 5. 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  Current Therapy:    Lovenox 120 mg subcutaneous daily     Interim History:  Steve [name] is back for followup. He is doing okay although his have weakness in his legs. He has some balance issues. I think the body some physical therapy. This does not happen all the time. However he has hasn't fallen. He is on Lovenox. Within have the following problems, I would think that we need to get some physical therapy decedents about giving him some more strength.  We did go ahead and do a followup PET scan on the mesothelioma.Thankfully, the PET scan did not show any growth of his mesothelioma.  He's had no heart issues. He's had no pain problems.  Still smokes quite a bit.  Appetite has been doing okay. He has had no nausea or vomiting.  His wife says that at nighttime, he has some jerking movements. Am not sure what is really means. I know that he does have sleep apnea. He does not use the CPAP machine. It is possible he may be desaturating that could be causing this problem. I told them that he probably needs to back to his pulmonary doctor.  Medications: Current outpatient prescriptions:albuterol (PROVENTIL HFA;VENTOLIN HFA) 108 (90 BASE) MCG/ACT inhaler, Inhale 1 puff into the lungs every 6 (six) hours as needed for shortness of breath., Disp: , Rfl: ;  albuterol (PROVENTIL) (2.5 MG/3ML) 0.083% nebulizer solution, Take 3 mLs (2.5 mg total) by nebulization every 6 (six) hours as needed for wheezing., Disp: 360 mL, Rfl: 6 budesonide-formoterol (SYMBICORT) 160-4.5 MCG/ACT inhaler, Inhale 2 puffs into the lungs 2 (two) times daily., Disp: 1 Inhaler,  Rfl: 6;  calcium carbonate (OS-CAL - DOSED IN MG OF ELEMENTAL CALCIUM) 1250 MG tablet, Take 2 tablets by mouth 2 (two) times daily., Disp: , Rfl: ;  Cholecalciferol (VITAMIN D3) 1000 UNITS CAPS, Take 1 capsule by mouth daily., Disp: , Rfl:  enoxaparin (LOVENOX) 120 MG/0.8ML injection, Inject 0.8 mLs (120 mg total) into the skin daily., Disp: 30 Syringe, Rfl: 3;  folic acid (FOLVITE) 1 MG tablet, Take 1 mg by mouth daily., Disp: , Rfl: ;  HYDROcodone-acetaminophen (NORCO/VICODIN) 5-325 MG per tablet, Take 1 tablet by mouth every 6 (six) hours as needed for moderate pain., Disp: 60 tablet, Rfl: 0 levothyroxine (SYNTHROID, LEVOTHROID) 150 MCG tablet, Take 150 mcg by mouth daily before breakfast., Disp: , Rfl: ;  lidocaine-prilocaine (EMLA) cream, Apply 1 application topically as needed., Disp: 30 g, Rfl: 3;  lisinopril (PRINIVIL,ZESTRIL) 10 MG tablet, Take 10 mg by mouth 2 (two) times daily., Disp: , Rfl: ;  losartan (COZAAR) 25 MG tablet, TAKE ONE TABLET BY MOUTH ONCE DAILY, Disp: 30 tablet, Rfl: 0 metoprolol tartrate (LOPRESSOR) 25 MG tablet, Take 12.5 mg by mouth 2 (two) times daily., Disp: , Rfl: ;  metoprolol tartrate (LOPRESSOR) 25 MG tablet, TAKE ONE-HALF TABLET BY MOUTH TWICE DAILY, Disp: 30 tablet, Rfl: 0;  ondansetron (ZOFRAN) 8 MG tablet, Take 8 mg by mouth as needed. , Disp: , Rfl: ;  polyethylene glycol (MIRALAX) packet, Take 17 g by mouth 2 (two) times daily., Disp: 14 each, Rfl: 3 prochlorperazine (COMPAZINE) 10 MG tablet, Take 10 mg by mouth  every 8 (eight) hours as needed. , Disp: , Rfl: ;  senna (SENOKOT) 8.6 MG TABS, Take 2 tablets by mouth at bedtime., Disp: , Rfl: ;  sertraline (ZOLOFT) 100 MG tablet, Take 1.5 tablets (150 mg total) by mouth daily with breakfast., Disp: 135 tablet, Rfl: 1;  SPIRIVA HANDIHALER 18 MCG inhalation capsule, Place 18 mcg into inhaler and inhale every morning. , Disp: , Rfl:  nicotine (NICODERM CQ - DOSED IN MG/24 HOURS) 21 mg/24hr patch, Place 21 mg onto the skin.  NOT USING, Disp: , Rfl:   Allergies:  Allergies  Allergen Reactions  . Lyrica [Pregabalin] Hives, Itching and Rash    Past Medical History, Surgical history, Social history, and Family History were reviewed and updated.  Review of Systems: As above  Physical Exam:  height is 5\' 8"  (1.727 m) and weight is 197 lb (89.359 kg). His oral temperature is 98.8 F (37.1 C). His blood pressure is 142/60 and his pulse is 82. His respiration is 20 and oxygen saturation is 99%.    somewhat elderly white gentleman. Head and neck exam shows no he has decent breath sounds bilaterally. Cardiac exam regular in rhythm with no murmurs rubs or bruits. Abdomen is soft. Has good bowel sounds. There is no fluid wave. There is no palpable liver. His spleen tip may be palpable below the left costal margin. Neck exam no tenderness over the spine ribs or hips. Extremities shows no clubbing cyanosis or edema. No obvious his cord is noted in the like. She exam shows some scattered ecchymoses. Neurological exam shows no focal neurological deficits.ocular or oral lesions. There are no probable cervical or supraclavicular lymph nodes. Lungs are clear bilaterally. Cardiac exam regular in rhythm with no murmurs rubs or bruits.  Lab Results  Component Value Date   WBC 6.7 07/08/2013   HGB 12.9* 07/08/2013   HCT 38.9 07/08/2013   MCV 105* 07/08/2013   PLT 121* 07/08/2013     Chemistry      Component Value Date/Time   NA 146* 07/08/2013 1001   NA 139 05/30/2013 1520   NA 146* 04/14/2013 1312   K 4.2 07/08/2013 1001   K 3.9 05/30/2013 1520   K 4.0 04/14/2013 1312   CL 102 07/08/2013 1001   CL 101 05/30/2013 1520   CO2 29 07/08/2013 1001   CO2 28 05/30/2013 1520   CO2 29 04/14/2013 1312   BUN 20 07/08/2013 1001   BUN 20 05/30/2013 1520   BUN 21.2 04/14/2013 1312   CREATININE 1.1 07/08/2013 1001   CREATININE 1.41* 05/30/2013 1520   CREATININE 1.0 04/14/2013 1312      Component Value Date/Time   CALCIUM 9.5 07/08/2013 1001   CALCIUM  8.7 05/30/2013 1520   CALCIUM 8.8 04/14/2013 1312   ALKPHOS 84 07/08/2013 1001   ALKPHOS 109 05/30/2013 1520   ALKPHOS 75 04/14/2013 1312   AST 21 07/08/2013 1001   AST 13 05/30/2013 1520   AST 17 04/14/2013 1312   ALT 21 07/08/2013 1001   ALT 12 05/30/2013 1520   ALT 26 04/14/2013 1312   BILITOT 0.50 07/08/2013 1001   BILITOT 0.5 05/30/2013 1520   BILITOT 0.43 04/14/2013 1312         Impression and Plan: Steve [name] is  80 year old gentleman with a multiple medical problems.. He has a mesothelioma. He had 5 cycles of chemotherapy. This was effective but yet he had poor tolerability. He was given 5 cycles of carboplatin and Alimta. Again this was  effective but yet she had problems and was hospitalized with recurrent issues.  Quality-of-life is what is important.  We will see about some home physical therapy for him.  I will not do any additional scans on him right now. I don't think we have to do anything along those lines.  I will see about a chest x-ray when I see him back.  I think that his big problem will be the continued smoking. It is believed that he probably is going to run into problems with COPD. I that he may run into problems with cardiac issues. Again he is on Lovenox so this may help with any kind of cardiac issue.    Volanda Napoleon, MD 6/30/20151:10 PM

## 2013-07-09 LAB — HEPARIN ANTI-XA: Heparin LMW: <0.1 IU/mL

## 2013-07-10 ENCOUNTER — Emergency Department (HOSPITAL_COMMUNITY)
Admission: EM | Admit: 2013-07-10 | Discharge: 2013-07-11 | Disposition: A | Discharge status: Home / Self Care | Payer: Medicare Other | Attending: Emergency Medicine | Admitting: Emergency Medicine

## 2013-07-10 ENCOUNTER — Other Ambulatory Visit: Payer: Self-pay | Admitting: Family Medicine

## 2013-07-10 ENCOUNTER — Encounter (HOSPITAL_COMMUNITY): Payer: Self-pay | Admitting: Emergency Medicine

## 2013-07-10 ENCOUNTER — Emergency Department (HOSPITAL_COMMUNITY): Payer: Medicare Other

## 2013-07-10 ENCOUNTER — Other Ambulatory Visit: Payer: Self-pay

## 2013-07-10 DIAGNOSIS — F3289 Other specified depressive episodes: Secondary | ICD-10-CM | POA: Insufficient documentation

## 2013-07-10 DIAGNOSIS — R238 Other skin changes: Secondary | ICD-10-CM | POA: Insufficient documentation

## 2013-07-10 DIAGNOSIS — R05 Cough: Secondary | ICD-10-CM | POA: Insufficient documentation

## 2013-07-10 DIAGNOSIS — I10 Essential (primary) hypertension: Secondary | ICD-10-CM | POA: Insufficient documentation

## 2013-07-10 DIAGNOSIS — C801 Malignant (primary) neoplasm, unspecified: Secondary | ICD-10-CM | POA: Insufficient documentation

## 2013-07-10 DIAGNOSIS — R062 Wheezing: Secondary | ICD-10-CM | POA: Insufficient documentation

## 2013-07-10 DIAGNOSIS — J441 Chronic obstructive pulmonary disease with (acute) exacerbation: Secondary | ICD-10-CM | POA: Insufficient documentation

## 2013-07-10 DIAGNOSIS — G4730 Sleep apnea, unspecified: Secondary | ICD-10-CM | POA: Insufficient documentation

## 2013-07-10 DIAGNOSIS — Z888 Allergy status to other drugs, medicaments and biological substances status: Secondary | ICD-10-CM | POA: Insufficient documentation

## 2013-07-10 DIAGNOSIS — Z8719 Personal history of other diseases of the digestive system: Secondary | ICD-10-CM | POA: Insufficient documentation

## 2013-07-10 DIAGNOSIS — R0682 Tachypnea, not elsewhere classified: Secondary | ICD-10-CM | POA: Insufficient documentation

## 2013-07-10 DIAGNOSIS — M7020 Olecranon bursitis, unspecified elbow: Secondary | ICD-10-CM | POA: Insufficient documentation

## 2013-07-10 DIAGNOSIS — Z8709 Personal history of other diseases of the respiratory system: Secondary | ICD-10-CM | POA: Insufficient documentation

## 2013-07-10 DIAGNOSIS — Z8701 Personal history of pneumonia (recurrent): Secondary | ICD-10-CM | POA: Insufficient documentation

## 2013-07-10 DIAGNOSIS — N400 Enlarged prostate without lower urinary tract symptoms: Secondary | ICD-10-CM | POA: Insufficient documentation

## 2013-07-10 DIAGNOSIS — I714 Abdominal aortic aneurysm, without rupture, unspecified: Secondary | ICD-10-CM | POA: Insufficient documentation

## 2013-07-10 DIAGNOSIS — K219 Gastro-esophageal reflux disease without esophagitis: Secondary | ICD-10-CM | POA: Insufficient documentation

## 2013-07-10 DIAGNOSIS — R059 Cough, unspecified: Secondary | ICD-10-CM | POA: Insufficient documentation

## 2013-07-10 DIAGNOSIS — R040 Epistaxis: Secondary | ICD-10-CM | POA: Insufficient documentation

## 2013-07-10 DIAGNOSIS — M129 Arthropathy, unspecified: Secondary | ICD-10-CM | POA: Insufficient documentation

## 2013-07-10 DIAGNOSIS — F329 Major depressive disorder, single episode, unspecified: Secondary | ICD-10-CM | POA: Insufficient documentation

## 2013-07-10 DIAGNOSIS — Z79899 Other long term (current) drug therapy: Secondary | ICD-10-CM | POA: Insufficient documentation

## 2013-07-10 DIAGNOSIS — Z86718 Personal history of other venous thrombosis and embolism: Secondary | ICD-10-CM | POA: Insufficient documentation

## 2013-07-10 DIAGNOSIS — F17200 Nicotine dependence, unspecified, uncomplicated: Secondary | ICD-10-CM | POA: Insufficient documentation

## 2013-07-10 DIAGNOSIS — F411 Generalized anxiety disorder: Secondary | ICD-10-CM | POA: Insufficient documentation

## 2013-07-10 DIAGNOSIS — I4891 Unspecified atrial fibrillation: Secondary | ICD-10-CM | POA: Insufficient documentation

## 2013-07-10 DIAGNOSIS — D72829 Elevated white blood cell count, unspecified: Secondary | ICD-10-CM

## 2013-07-10 LAB — PROTEIN ELECTROPHORESIS, SERUM, WITH REFLEX
ALBUMIN ELP: 50.9 % — AB (ref 55.8–66.1)
ALPHA-1-GLOBULIN: 3.6 % (ref 2.9–4.9)
Alpha-2-Globulin: 12.7 % — ABNORMAL HIGH (ref 7.1–11.8)
Beta 2: 5.5 % (ref 3.2–6.5)
Beta Globulin: 16.2 % — ABNORMAL HIGH (ref 4.7–7.2)
Gamma Globulin: 11.1 % (ref 11.1–18.8)
M-SPIKE, %: 0.6 g/dL
TOTAL PROTEIN, SERUM ELECTROPHOR: 7.3 g/dL (ref 6.0–8.3)

## 2013-07-10 LAB — CBC WITH DIFFERENTIAL/PLATELET
BASOS ABS: 0 10*3/uL (ref 0.0–0.1)
Basophils Relative: 0 % (ref 0–1)
Eosinophils Absolute: 0 10*3/uL (ref 0.0–0.7)
Eosinophils Relative: 0 % (ref 0–5)
HCT: 37.6 % — ABNORMAL LOW (ref 39.0–52.0)
Hemoglobin: 12.3 g/dL — ABNORMAL LOW (ref 13.0–17.0)
LYMPHS ABS: 0.7 10*3/uL (ref 0.7–4.0)
Lymphocytes Relative: 6 % — ABNORMAL LOW (ref 12–46)
MCH: 33.4 pg (ref 26.0–34.0)
MCHC: 32.7 g/dL (ref 30.0–36.0)
MCV: 102.2 fL — ABNORMAL HIGH (ref 78.0–100.0)
MONO ABS: 0.5 10*3/uL (ref 0.1–1.0)
Monocytes Relative: 4 % (ref 3–12)
Neutro Abs: 10.5 10*3/uL — ABNORMAL HIGH (ref 1.7–7.7)
Neutrophils Relative %: 90 % — ABNORMAL HIGH (ref 43–77)
Platelets: 110 10*3/uL — ABNORMAL LOW (ref 150–400)
RBC: 3.68 MIL/uL — ABNORMAL LOW (ref 4.22–5.81)
RDW: 16.9 % — AB (ref 11.5–15.5)
WBC Morphology: INCREASED
WBC: 11.7 10*3/uL — ABNORMAL HIGH (ref 4.0–10.5)

## 2013-07-10 LAB — I-STAT CG4 LACTIC ACID, ED: Lactic Acid, Venous: 2.42 mmol/L — ABNORMAL HIGH (ref 0.5–2.2)

## 2013-07-10 LAB — COMPREHENSIVE METABOLIC PANEL
ALT: 20 U/L (ref 0–53)
AST: 18 U/L (ref 0–37)
Albumin: 3.4 g/dL — ABNORMAL LOW (ref 3.5–5.2)
Alkaline Phosphatase: 99 U/L (ref 39–117)
Anion gap: 15 (ref 5–15)
BUN: 25 mg/dL — ABNORMAL HIGH (ref 6–23)
CALCIUM: 9.1 mg/dL (ref 8.4–10.5)
CO2: 26 meq/L (ref 19–32)
CREATININE: 1.2 mg/dL (ref 0.50–1.35)
Chloride: 101 mEq/L (ref 96–112)
GFR calc Af Amer: 65 mL/min — ABNORMAL LOW (ref >90)
GFR calc non Af Amer: 56 mL/min — ABNORMAL LOW (ref >90)
Glucose, Bld: 136 mg/dL — ABNORMAL HIGH (ref 70–99)
Potassium: 4.2 mEq/L (ref 3.7–5.3)
Sodium: 142 mEq/L (ref 137–147)
TOTAL PROTEIN: 7.9 g/dL (ref 6.0–8.3)
Total Bilirubin: 0.2 mg/dL — ABNORMAL LOW (ref 0.3–1.2)

## 2013-07-10 LAB — IFE INTERPRETATION

## 2013-07-10 LAB — IGG, IGA, IGM
IgA: 1020 mg/dL — ABNORMAL HIGH (ref 68–379)
IgG (Immunoglobin G), Serum: 824 mg/dL (ref 650–1600)
IgM, Serum: 106 mg/dL (ref 41–251)

## 2013-07-10 LAB — I-STAT TROPONIN, ED: TROPONIN I, POC: 0 ng/mL (ref 0.00–0.08)

## 2013-07-10 LAB — KAPPA/LAMBDA LIGHT CHAINS
KAPPA LAMBDA RATIO: 0.36 (ref 0.26–1.65)
Kappa free light chain: 3.15 mg/dL — ABNORMAL HIGH (ref 0.33–1.94)
Lambda Free Lght Chn: 8.73 mg/dL — ABNORMAL HIGH (ref 0.57–2.63)

## 2013-07-10 MED ORDER — IPRATROPIUM BROMIDE 0.02 % IN SOLN
1.0000 mg | Freq: Once | RESPIRATORY_TRACT | Status: AC
  Administered 2013-07-10: 1 mg via RESPIRATORY_TRACT
  Filled 2013-07-10: qty 5

## 2013-07-10 MED ORDER — SODIUM CHLORIDE 0.9 % IV BOLUS (SEPSIS): 1000.0000 mL | Freq: Once | INTRAVENOUS | Status: DC

## 2013-07-10 MED ORDER — LEVOFLOXACIN 500 MG PO TABS: 500.0000 mg | ORAL_TABLET | Freq: Every day | ORAL | Status: DC

## 2013-07-10 MED ORDER — LEVOFLOXACIN 500 MG PO TABS
750.0000 mg | ORAL_TABLET | Freq: Once | ORAL | Status: AC
  Administered 2013-07-10: 750 mg via ORAL
  Filled 2013-07-10: qty 2

## 2013-07-10 MED ORDER — METHYLPREDNISOLONE SODIUM SUCC 125 MG IJ SOLR
125.0000 mg | Freq: Once | INTRAMUSCULAR | Status: AC
  Administered 2013-07-10: 125 mg via INTRAVENOUS
  Filled 2013-07-10: qty 2

## 2013-07-10 MED ORDER — SODIUM CHLORIDE 0.9 % IJ SOLN
INTRAMUSCULAR | Status: AC
  Filled 2013-07-10: qty 10

## 2013-07-10 MED ORDER — PREDNISONE 20 MG PO TABS: ORAL_TABLET | ORAL | Status: DC

## 2013-07-10 MED ORDER — ALBUTEROL (5 MG/ML) CONTINUOUS INHALATION SOLN
15.0000 mg/h | INHALATION_SOLUTION | Freq: Once | RESPIRATORY_TRACT | Status: AC
  Administered 2013-07-10: 15 mg/h via RESPIRATORY_TRACT
  Filled 2013-07-10: qty 20

## 2013-07-10 NOTE — ED Notes (Signed)
Per EMS, Pt from home with h x of COPD, lung cancer, smoking. Family called on his behalf because he got short of breath walking from bathroom to living room. Family gave pt. Breathing treatment before EMS arrived and started him on 6L o2 instead of his normal 3L. Pt had recovered by the time EMS got there but family wanted him to get checked out because is was out of the ordinary. A&Ox4.

## 2013-07-10 NOTE — ED Notes (Signed)
Steve [name], EDP Made aware of CG4 Lactic results.

## 2013-07-10 NOTE — ED Notes (Signed)
Respiratory called, will come for treatment.

## 2013-07-10 NOTE — ED Notes (Signed)
Patient transported to X-ray 

## 2013-07-10 NOTE — ED Notes (Signed)
Bed: KN18 Expected date:  Expected time:  Means of arrival:  Comments: EMS/elderly with throat pain after eating lunch

## 2013-07-10 NOTE — ED Provider Notes (Signed)
CSN: [id number]     Arrival date & time 07/10/13  2055 History   First MD Initiated Contact with Patient 07/10/13 2058     Chief Complaint  Patient presents with  . Shortness of Breath     (Consider location/radiation/quality/duration/timing/severity/associated sxs/prior Treatment) The history is provided by the patient.  Steve [name] is a 80 y.o. male hx of AAA, GERD, DVT off coumadin, COPD, here with SOB. He was walking to the bathroom and became short of breath. He wears oxygen at night and as needed. Has some cough as well. Denies fever. Still smokes. He was given albuterol and felt better.    Past Medical History  Diagnosis Date  . Prostate enlargement   . GERD (gastroesophageal reflux disease)   . AAA (abdominal aortic aneurysm)     stress test 09/14/10 [facility]  . Anxiety   . Recurrent upper respiratory infection (URI)     productive cough- white phlegm- no fever  . Hypertension     chest x ray 12/12 EP[facility]- repeated 06/06/11, EKG 11/12 [facility]  . DVT (deep venous thrombosis) 08/2011  . Pneumonia   . Bruises easily     takes Xarelto  . Depression   . Dysrhythmia     hx of atrial fib post op x 2 days   . Atrial fibrillation   . Mesothelioma   . Arthritis     knee and back  . Sleep apnea      12/12 sleep study,SEVERE per study  Dr Lamonte Sakai- states doesnt wear machine on regular basis- setting BiPAP 9/9  . H/O hiatal hernia   . COPD (chronic obstructive pulmonary disease)     patient uses oxygen 2L/[place] at nite   . Epistaxis 05/13/2012  . Olecranon bursitis of right elbow 06/28/2012   Past Surgical History  Procedure Laterality Date  . Knee surgery  1957    left knee arthotomy  . Abdominal aortic aneurysm repair  11/14/2010    AAA Repair    Dr Donnetta Hutching  . Transurethral resection of prostate  01/25/2011    TURP  . Incisional hernia repair  06/12/2011    Procedure: HERNIA REPAIR INCISIONAL;  Surgeon: Earnstine Regal, MD;  Location: [facility];  Service: General;  Laterality: N/A;  Open Repair  Ventral Incisional Hernia with Mesh  . Hernia repair    . Video assisted thoracoscopy  11/02/2011    Procedure: VIDEO ASSISTED THORACOSCOPY;  Surgeon: Melrose Nakayama, MD;  Location: [place];  Service: Thoracic;  Laterality: Right;  . Pleural biopsy  11/02/2011    Procedure: PLEURAL BIOPSY;  Surgeon: Melrose Nakayama, MD;  Location: [place];  Service: Thoracic;  Laterality: Right;  Parietal and visceral biopsies  . Chest tube insertion  11/02/2011    Procedure: INSERTION PLEURAL DRAINAGE CATHETER;  Surgeon: Melrose Nakayama, MD;  Location: [place];  Service: Thoracic;  Laterality: Right;  . Decortication  11/02/2011    Procedure: DECORTICATION;  Surgeon: Melrose Nakayama, MD;  Location: [place];  Service: Thoracic;  Laterality: Right;  . Pleural effusion drainage  11/02/2011    Procedure: DRAINAGE OF PLEURAL EFFUSION;  Surgeon: Melrose Nakayama, MD;  Location: [place];  Service: Thoracic;  Laterality: Right;  . Cholecystectomy  01/19/2012    Procedure: LAPAROSCOPIC CHOLECYSTECTOMY WITH INTRAOPERATIVE CHOLANGIOGRAM;  Surgeon: Zenovia Jarred, MD;  Location: [place];  Service: General;  Laterality: N/A;  laparoscopic cholecystectomy with intraoperative cholangiogram  . Ercp  01/22/2012    Procedure: ENDOSCOPIC RETROGRADE  CHOLANGIOPANCREATOGRAPHY (ERCP);  Surgeon: Inda Castle, MD;  Location: [place];  Service: Endoscopy;  Laterality: N/A;  with stent placement  . Ercp  01/21/2012    Procedure: ENDOSCOPIC RETROGRADE CHOLANGIOPANCREATOGRAPHY (ERCP);  Surgeon: Milus Banister, MD;  Location: [place];  Service: Gastroenterology;  Laterality: N/A;  . Esophagogastroduodenoscopy  01/24/2012    Procedure: ESOPHAGOGASTRODUODENOSCOPY (EGD);  Surgeon: Inda Castle, MD;  Location: [place];  Service: Endoscopy;  Laterality: N/A;  remove pancr stent   . Endoscopic retrograde cholangiopancreatography (ercp) with propofol N/A 03/07/2012    Procedure: ENDOSCOPIC RETROGRADE CHOLANGIOPANCREATOGRAPHY  (ERCP) WITH PROPOFOL;  Surgeon: Milus Banister, MD;  Location: [facility];  Service: Endoscopy;  Laterality: N/A;  remove stent  . Thyroidectomy N/A 04/04/2012    Procedure:  TOTAL THYROIDECTOMY WITH CENTRAL COMPARTMENT LYMPH NODE DISSECTION;  Surgeon: Earnstine Regal, MD;  Location: [facility];  Service: General;  Laterality: N/A;   Family History  Problem Relation Age of Onset  . Arthritis Mother   . Heart failure Mother   . Hypertension Mother   . Diabetes Mother   . Heart failure Daughter     deceased age 37  . Other Daughter     deceased age 26 MVA   History  Substance Use Topics  . Smoking status: Current Every Day Smoker -- 1.50 packs/day for 60 years    Types: Cigarettes    Start date: 02/12/1954  . Smokeless tobacco: Never Used     Comment:   07-08-13  still smoking daily  . Alcohol Use: No    Review of Systems  Respiratory: Positive for cough and shortness of breath.   All other systems reviewed and are negative.     Allergies  Lyrica  Home Medications   Prior to Admission medications   Medication Sig Start Date End Date Taking? Authorizing Provider  albuterol (PROVENTIL HFA;VENTOLIN HFA) 108 (90 BASE) MCG/ACT inhaler Inhale 1 puff into the lungs every 6 (six) hours as needed for shortness of breath.   Yes Historical Provider, MD  albuterol (PROVENTIL) (2.5 MG/3ML) 0.083% nebulizer solution Take 3 mLs (2.5 mg total) by nebulization every 6 (six) hours as needed for wheezing. 03/24/13  Yes Collene Gobble, MD  budesonide-formoterol (SYMBICORT) 160-4.5 MCG/ACT inhaler Inhale 2 puffs into the lungs 2 (two) times daily. 03/24/13  Yes Collene Gobble, MD  calcium carbonate (OS-CAL - DOSED IN MG OF ELEMENTAL CALCIUM) 1250 MG tablet Take 2 tablets by mouth 2 (two) times daily. 04/05/12  Yes Earnstine Regal, MD  Cholecalciferol (VITAMIN D3) 1000 UNITS CAPS Take 1 capsule by mouth daily.   Yes Historical Provider, MD  enoxaparin (LOVENOX) 120 MG/0.8ML injection Inject 0.8 mLs (120 mg  total) into the skin daily. 05/19/13  Yes Volanda Napoleon, MD  folic acid (FOLVITE) 1 MG tablet Take 1 mg by mouth daily. 01/23/13  Yes Volanda Napoleon, MD  HYDROcodone-acetaminophen (NORCO/VICODIN) 5-325 MG per tablet Take 1 tablet by mouth every 6 (six) hours as needed for moderate pain. 05/30/13  Yes Volanda Napoleon, MD  levothyroxine (SYNTHROID, LEVOTHROID) 150 MCG tablet Take 150 mcg by mouth daily before breakfast.   Yes Historical Provider, MD  lidocaine-prilocaine (EMLA) cream Apply 1 application topically as needed. 01/21/13  Yes Volanda Napoleon, MD  lisinopril (PRINIVIL,ZESTRIL) 10 MG tablet Take 10 mg by mouth 2 (two) times daily.   Yes Historical Provider, MD  losartan (COZAAR) 25 MG tablet Take 25 mg by mouth daily.   Yes Historical  Provider, MD  metoprolol tartrate (LOPRESSOR) 25 MG tablet Take 12.5 mg by mouth 2 (two) times daily.   Yes Historical Provider, MD  ondansetron (ZOFRAN) 8 MG tablet Take 8 mg by mouth as needed for nausea.  04/21/13  Yes Historical Provider, MD  polyethylene glycol (MIRALAX) packet Take 17 g by mouth 2 (two) times daily. 04/07/13  Yes Costin Karlyne Greenspan, MD  sertraline (ZOLOFT) 100 MG tablet Take 1.5 tablets (150 mg total) by mouth daily with breakfast. 06/05/13  Yes Mosie Lukes, MD  SPIRIVA HANDIHALER 18 MCG inhalation capsule Place 18 mcg into inhaler and inhale every morning.  02/17/13  Yes Historical Provider, MD   BP 115/62  Pulse 117  Temp(Src) 98.6 F (37 C) (Oral)  Resp 29  SpO2 93% Physical Exam  Nursing note and vitals reviewed. Constitutional: He is oriented to person, place, and time.  Tachypneic   HENT:  Head: Normocephalic.  Mouth/Throat: Oropharynx is clear and moist.  Eyes: Conjunctivae are normal. Pupils are equal, round, and reactive to light.  Neck: Normal range of motion. Neck supple.  Cardiovascular: Normal rate, regular rhythm and normal heart sounds.   Pulmonary/Chest:  + diffuse wheezing. No crackles. Talking in full sentences    Abdominal: Soft. Bowel sounds are normal. He exhibits no distension. There is no tenderness. There is no rebound and no guarding.  Musculoskeletal: Normal range of motion. He exhibits no edema and no tenderness.  Neurological: He is alert and oriented to person, place, and time.  Skin: Skin is warm and dry.  Psychiatric: He has a normal mood and affect. His behavior is normal. Judgment and thought content normal.    ED Course  Procedures (including critical care time) Labs Review Labs Reviewed  CBC WITH DIFFERENTIAL - Abnormal; Notable for the following:    WBC 11.7 (*)    RBC 3.68 (*)    Hemoglobin 12.3 (*)    HCT 37.6 (*)    MCV 102.2 (*)    RDW 16.9 (*)    Platelets 110 (*)    Neutrophils Relative % 90 (*)    Lymphocytes Relative 6 (*)    Neutro Abs 10.5 (*)    All other components within normal limits  COMPREHENSIVE METABOLIC PANEL - Abnormal; Notable for the following:    Glucose, Bld 136 (*)    BUN 25 (*)    Albumin 3.4 (*)    Total Bilirubin 0.2 (*)    GFR calc non Af Amer 56 (*)    GFR calc Af Amer 65 (*)    All other components within normal limits  I-STAT CG4 LACTIC ACID, ED - Abnormal; Notable for the following:    Lactic Acid, Venous 2.42 (*)    All other components within normal limits  I-STAT TROPOININ, ED    Imaging Review Dg Chest Portable 1 View  07/10/2013   CLINICAL DATA:  SHORTNESS OF BREATH  EXAM: PORTABLE CHEST - 1 VIEW  COMPARISON:  Prior radiograph from 04/02/2013  FINDINGS: Right-sided Port-A-Cath is stable. Cardiomegaly is unchanged. Prominence of the aortic arch and aortic knob is also stable. Surgical clips overlie the neck.  Lungs are hypoinflated. Curvilinear opacities within the right lung base are most compatible with atelectasis/ scarring. There is extensive nodular pleural thickening within the right hemi thorax, grossly stable. Underlying interstitial prominence throughout the lungs is also grossly stable. No consolidative airspace disease  identified. No definite pleural effusion. No pneumothorax.  Osseous structures are unchanged.  IMPRESSION: Similar appearing of  the chest as compared to prior study with increased interstitial prominence, which may related to shallow lung inflation and/or rim mild edema. Nodular pleural thickening within the right hemi thorax is grossly stable, consistent with known history of mesial thickening.   Electronically Signed   By: Jeannine Boga M.D.   On: 07/10/2013 22:09     EKG Interpretation None      MDM   Final diagnoses:  None   Steve [name] is a 80 y.o. male here with SOB. Likely COPD exacerbation. Will get xray, labs. Will give nebs and steroids and reassess.   11:04 PM After nebs, steroids. Felt better. Minimal wheezing. On 3L O2 92-93% (on O2 at baseline). WBC slightly elevated with bands. Doesn't appear septic. Lact slightly elevated. I want to hydrate him but he wants to go home. Will d/c on levaquin empirically (for COPD, elevated WBC with bands), steroids, albuterol.     Wandra Arthurs, MD 07/10/13 [phone]

## 2013-07-10 NOTE — Discharge Instructions (Signed)
Take levaquin for a week.   Take prednisone as prescribed.   Use albuterol every 4 hrs for 2 days then as needed.   Stop smoking. Use oxygen as needed   Stay hydrated.   Follow up with your doctor.   Return to ER if you have trouble breathing, fever, cough.

## 2013-07-10 NOTE — ED Notes (Signed)
Bed: YS16 Expected date:  Expected time:  Means of arrival:  Comments: EMS/80 yo with SOB/neb at home with O2

## 2013-07-14 ENCOUNTER — Ambulatory Visit (HOSPITAL_BASED_OUTPATIENT_CLINIC_OR_DEPARTMENT_OTHER)
Admission: RE | Admit: 2013-07-14 | Discharge: 2013-07-14 | Disposition: A | Discharge status: Home / Self Care | Payer: Medicare Other | Source: Ambulatory Visit | Attending: Hematology & Oncology | Admitting: Hematology & Oncology

## 2013-07-14 ENCOUNTER — Encounter: Payer: Self-pay | Admitting: Nurse Practitioner

## 2013-07-14 ENCOUNTER — Other Ambulatory Visit: Payer: Self-pay | Admitting: *Deleted

## 2013-07-14 DIAGNOSIS — C384 Malignant neoplasm of pleura: Secondary | ICD-10-CM | POA: Insufficient documentation

## 2013-07-14 DIAGNOSIS — C450 Mesothelioma of pleura: Secondary | ICD-10-CM

## 2013-07-14 DIAGNOSIS — R109 Unspecified abdominal pain: Secondary | ICD-10-CM

## 2013-07-14 DIAGNOSIS — C73 Malignant neoplasm of thyroid gland: Secondary | ICD-10-CM

## 2013-07-14 NOTE — Progress Notes (Signed)
Faxed letter of medical necessity for Lovenox to [facility] as requested.

## 2013-07-23 ENCOUNTER — Other Ambulatory Visit: Payer: Self-pay | Admitting: Family Medicine

## 2013-08-12 ENCOUNTER — Ambulatory Visit (INDEPENDENT_AMBULATORY_CARE_PROVIDER_SITE_OTHER): Payer: Medicare Other | Admitting: Family Medicine

## 2013-08-12 ENCOUNTER — Other Ambulatory Visit: Payer: Self-pay | Admitting: Family Medicine

## 2013-08-12 ENCOUNTER — Encounter: Payer: Self-pay | Admitting: Family Medicine

## 2013-08-12 VITALS — BP 122/82 | HR 52 | Temp 97.6°F | Ht 68.0 in | Wt 200.0 lb

## 2013-08-12 DIAGNOSIS — F32A Depression, unspecified: Secondary | ICD-10-CM

## 2013-08-12 DIAGNOSIS — G4700 Insomnia, unspecified: Secondary | ICD-10-CM

## 2013-08-12 DIAGNOSIS — F17200 Nicotine dependence, unspecified, uncomplicated: Secondary | ICD-10-CM

## 2013-08-12 DIAGNOSIS — F411 Generalized anxiety disorder: Secondary | ICD-10-CM

## 2013-08-12 DIAGNOSIS — J209 Acute bronchitis, unspecified: Secondary | ICD-10-CM

## 2013-08-12 DIAGNOSIS — F3289 Other specified depressive episodes: Secondary | ICD-10-CM

## 2013-08-12 DIAGNOSIS — Z720 Tobacco use: Secondary | ICD-10-CM

## 2013-08-12 DIAGNOSIS — F329 Major depressive disorder, single episode, unspecified: Secondary | ICD-10-CM

## 2013-08-12 MED ORDER — SERTRALINE HCL 100 MG PO TABS: 200.0000 mg | ORAL_TABLET | Freq: Every day | ORAL | Status: AC

## 2013-08-12 MED ORDER — ALPRAZOLAM 0.25 MG PO TABS: 0.2500 mg | ORAL_TABLET | Freq: Two times a day (BID) | ORAL | Status: DC | PRN

## 2013-08-12 MED ORDER — METHYLPREDNISOLONE (PAK) 4 MG PO TABS: ORAL_TABLET | ORAL | Status: DC

## 2013-08-12 MED ORDER — LEVOFLOXACIN 250 MG PO TABS: 250.0000 mg | ORAL_TABLET | Freq: Every day | ORAL | Status: DC

## 2013-08-12 NOTE — Progress Notes (Signed)
Pre visit review using our clinic review tool, if applicable. No additional management support is needed unless otherwise documented below in the visit note. 

## 2013-08-12 NOTE — Patient Instructions (Signed)

## 2013-08-17 ENCOUNTER — Encounter: Payer: Self-pay | Admitting: Family Medicine

## 2013-08-17 DIAGNOSIS — F411 Generalized anxiety disorder: Secondary | ICD-10-CM | POA: Insufficient documentation

## 2013-08-17 DIAGNOSIS — J209 Acute bronchitis, unspecified: Secondary | ICD-10-CM | POA: Insufficient documentation

## 2013-08-17 NOTE — Assessment & Plan Note (Signed)
Started on Levaquin and a Medol dosepak. Add Mucinex and probiotics.

## 2013-08-17 NOTE — Assessment & Plan Note (Signed)
Having anxiety attacks with palpitations and agitation at times and trouble sleeping, his daughter is with him and after discussing options it is agreed we will try infrequent Alprazolam. They are aware of the pssible side effects, sedation, confusion, falls etc but would like to proceed to see if they can help him to stay calm. Reassess at next visit.

## 2013-08-17 NOTE — Progress Notes (Signed)
Patient ID: Steve [name], male   DOB: 02/27/1933, 80 y.o.   MRN: [id number] TAJAE RYBICKI [id number] 1933-09-11 08/17/2013      Progress Note-Follow Up  Subjective  Chief Complaint  Chief Complaint  Patient presents with  . Follow-up    HPI  Patient is a 80 year old male in today for routine medical care. Here today accompanied by his daughter. He has been struggling with increased dyspnea. He notes increased wheezing, cough, congestion, hypoxia, anxiety. He is having trouble sleeping and has moments of agitation and difficulty concentrating when his anxiety is up. He continues to decline referral for Hospice consultation and has been taking meds as prescribed. No recent hospitalizations. Denies CP/HA//fevers/GI or GU c/o. Taking meds as prescribed  Past Medical History  Diagnosis Date  . Prostate enlargement   . GERD (gastroesophageal reflux disease)   . AAA (abdominal aortic aneurysm)     stress test 09/14/10 [facility]  . Anxiety   . Recurrent upper respiratory infection (URI)     productive cough- white phlegm- no fever  . Hypertension     chest x ray 12/12 EP[facility]- repeated 06/06/11, EKG 11/12 [facility]  . DVT (deep venous thrombosis) 08/2011  . Pneumonia   . Bruises easily     takes Xarelto  . Depression   . Dysrhythmia     hx of atrial fib post op x 2 days   . Atrial fibrillation   . Mesothelioma   . Arthritis     knee and back  . Sleep apnea      12/12 sleep study,SEVERE per study  Dr Lamonte Sakai- states doesnt wear machine on regular basis- setting BiPAP 9/9  . H/O hiatal hernia   . COPD (chronic obstructive pulmonary disease)     patient uses oxygen 2L/[place] at nite   . Epistaxis 05/13/2012  . Olecranon bursitis of right elbow 06/28/2012    Past Surgical History  Procedure Laterality Date  . Knee surgery  1957    left knee arthotomy  . Abdominal aortic aneurysm repair  11/14/2010    AAA Repair    Dr Donnetta Hutching  . Transurethral resection of prostate  01/25/2011    TURP  . Incisional  hernia repair  06/12/2011    Procedure: HERNIA REPAIR INCISIONAL;  Surgeon: Earnstine Regal, MD;  Location: [facility];  Service: General;  Laterality: N/A;  Open Repair Ventral Incisional Hernia with Mesh  . Hernia repair    . Video assisted thoracoscopy  11/02/2011    Procedure: VIDEO ASSISTED THORACOSCOPY;  Surgeon: Melrose Nakayama, MD;  Location: [place];  Service: Thoracic;  Laterality: Right;  . Pleural biopsy  11/02/2011    Procedure: PLEURAL BIOPSY;  Surgeon: Melrose Nakayama, MD;  Location: [place];  Service: Thoracic;  Laterality: Right;  Parietal and visceral biopsies  . Chest tube insertion  11/02/2011    Procedure: INSERTION PLEURAL DRAINAGE CATHETER;  Surgeon: Melrose Nakayama, MD;  Location: [place];  Service: Thoracic;  Laterality: Right;  . Decortication  11/02/2011    Procedure: DECORTICATION;  Surgeon: Melrose Nakayama, MD;  Location: [place];  Service: Thoracic;  Laterality: Right;  . Pleural effusion drainage  11/02/2011    Procedure: DRAINAGE OF PLEURAL EFFUSION;  Surgeon: Melrose Nakayama, MD;  Location: [place];  Service: Thoracic;  Laterality: Right;  . Cholecystectomy  01/19/2012    Procedure: LAPAROSCOPIC CHOLECYSTECTOMY WITH INTRAOPERATIVE CHOLANGIOGRAM;  Surgeon: Zenovia Jarred, MD;  Location: [place];  Service: General;  Laterality: N/A;  laparoscopic cholecystectomy with intraoperative cholangiogram  . Ercp  01/22/2012    Procedure: ENDOSCOPIC RETROGRADE CHOLANGIOPANCREATOGRAPHY (ERCP);  Surgeon: Inda Castle, MD;  Location: [place];  Service: Endoscopy;  Laterality: N/A;  with stent placement  . Ercp  01/21/2012    Procedure: ENDOSCOPIC RETROGRADE CHOLANGIOPANCREATOGRAPHY (ERCP);  Surgeon: Milus Banister, MD;  Location: [place];  Service: Gastroenterology;  Laterality: N/A;  . Esophagogastroduodenoscopy  01/24/2012    Procedure: ESOPHAGOGASTRODUODENOSCOPY (EGD);  Surgeon: Inda Castle, MD;  Location: [place];  Service: Endoscopy;  Laterality: N/A;  remove  pancr stent   . Endoscopic retrograde cholangiopancreatography (ercp) with propofol N/A 03/07/2012    Procedure: ENDOSCOPIC RETROGRADE CHOLANGIOPANCREATOGRAPHY (ERCP) WITH PROPOFOL;  Surgeon: Milus Banister, MD;  Location: [facility];  Service: Endoscopy;  Laterality: N/A;  remove stent  . Thyroidectomy N/A 04/04/2012    Procedure:  TOTAL THYROIDECTOMY WITH CENTRAL COMPARTMENT LYMPH NODE DISSECTION;  Surgeon: Earnstine Regal, MD;  Location: [facility];  Service: General;  Laterality: N/A;    Family History  Problem Relation Age of Onset  . Arthritis Mother   . Heart failure Mother   . Hypertension Mother   . Diabetes Mother   . Heart failure Daughter     deceased age 12  . Other Daughter     deceased age 51 MVA    History   Social History  . Marital Status: Married    Spouse Name: N/A    Number of Children: 6  . Years of Education: N/A   Occupational History  . retired    Social History Main Topics  . Smoking status: Current Every Day Smoker -- 1.50 packs/day for 60 years    Types: Cigarettes    Start date: 02/12/1954  . Smokeless tobacco: Never Used     Comment:   07-08-13  still smoking daily  . Alcohol Use: No  . Drug Use: No  . Sexual Activity: Not Currently   Other Topics Concern  . Not on file   Social History Narrative  . No narrative on file    Current Outpatient Prescriptions on File Prior to Visit  Medication Sig Dispense Refill  . albuterol (PROVENTIL HFA;VENTOLIN HFA) 108 (90 BASE) MCG/ACT inhaler Inhale 1 puff into the lungs every 6 (six) hours as needed for shortness of breath.      Marland Kitchen albuterol (PROVENTIL) (2.5 MG/3ML) 0.083% nebulizer solution Take 3 mLs (2.5 mg total) by nebulization every 6 (six) hours as needed for wheezing.  360 mL  6  . budesonide-formoterol (SYMBICORT) 160-4.5 MCG/ACT inhaler Inhale 2 puffs into the lungs 2 (two) times daily.  1 Inhaler  6  . calcium carbonate (OS-CAL - DOSED IN MG OF ELEMENTAL CALCIUM) 1250 MG tablet Take 2 tablets  by mouth 2 (two) times daily.      . Cholecalciferol (VITAMIN D3) 1000 UNITS CAPS Take 1 capsule by mouth daily.      Marland Kitchen enoxaparin (LOVENOX) 120 MG/0.8ML injection Inject 0.8 mLs (120 mg total) into the skin daily.  30 Syringe  3  . folic acid (FOLVITE) 1 MG tablet Take 1 mg by mouth daily.      Marland Kitchen HYDROcodone-acetaminophen (NORCO/VICODIN) 5-325 MG per tablet Take 1 tablet by mouth every 6 (six) hours as needed for moderate pain.  60 tablet  0  . levofloxacin (LEVAQUIN) 500 MG tablet Take 1 tablet (500 mg total) by mouth daily.  7 tablet  0  . levothyroxine (SYNTHROID, LEVOTHROID) 150 MCG tablet  Take 150 mcg by mouth daily before breakfast.      . lidocaine-prilocaine (EMLA) cream Apply 1 application topically as needed.  30 g  3  . lisinopril (PRINIVIL,ZESTRIL) 10 MG tablet Take 10 mg by mouth 2 (two) times daily.      Marland Kitchen losartan (COZAAR) 25 MG tablet TAKE ONE TABLET BY MOUTH ONCE DAILY  30 tablet  0  . ondansetron (ZOFRAN) 8 MG tablet Take 8 mg by mouth as needed for nausea.       . polyethylene glycol (MIRALAX) packet Take 17 g by mouth 2 (two) times daily.  14 each  3  . predniSONE (DELTASONE) 20 MG tablet Take 60 mg daily x 2 days then 40 mg x 2 days then 20 mg x 2 days  12 tablet  0  . SPIRIVA HANDIHALER 18 MCG inhalation capsule Place 18 mcg into inhaler and inhale every morning.        No current facility-administered medications on file prior to visit.    Allergies  Allergen Reactions  . Lyrica [Pregabalin] Hives, Itching and Rash    Review of Systems  Review of Systems  Constitutional: Negative for fever and malaise/fatigue.  HENT: Positive for congestion.   Eyes: Negative for discharge.  Respiratory: Positive for cough, sputum production, shortness of breath and wheezing.   Cardiovascular: Negative for chest pain, palpitations and leg swelling.  Gastrointestinal: Negative for nausea, abdominal pain and diarrhea.  Genitourinary: Negative for dysuria.  Musculoskeletal:  Negative for falls.  Skin: Negative for rash.  Neurological: Negative for loss of consciousness and headaches.  Endo/Heme/Allergies: Negative for polydipsia.  Psychiatric/Behavioral: Positive for depression. Negative for suicidal ideas. The patient is nervous/anxious and has insomnia.     Objective  BP 122/82  Pulse 52  Temp(Src) 97.6 F (36.4 C) (Oral)  Ht 5\' 8"  (1.727 m)  Wt 200 lb (90.719 kg)  BMI 30.42 kg/m2  SpO2 95%  Physical Exam  Physical Exam  Constitutional: He is oriented to person, place, and time and well-developed, well-nourished, and in no distress. No distress.  HENT:  Head: Normocephalic and atraumatic.  Eyes: Conjunctivae are normal.  Neck: Neck supple. No thyromegaly present.  Cardiovascular: Normal rate and regular rhythm.   Murmur heard. Pulmonary/Chest: Effort normal. No respiratory distress. He has wheezes.  Prolonged expiration  Abdominal: He exhibits no distension and no mass. There is no tenderness.  Musculoskeletal: He exhibits no edema.  Neurological: He is alert and oriented to person, place, and time.  Skin: Skin is warm.  Psychiatric: Memory, affect and judgment normal.    Lab Results  Component Value Date   TSH 1.636 04/03/2013   Lab Results  Component Value Date   WBC 11.7* 07/10/2013   HGB 12.3* 07/10/2013   HCT 37.6* 07/10/2013   MCV 102.2* 07/10/2013   PLT 110* 07/10/2013   Lab Results  Component Value Date   CREATININE 1.20 07/10/2013   BUN 25* 07/10/2013   NA 142 07/10/2013   K 4.2 07/10/2013   CL 101 07/10/2013   CO2 26 07/10/2013   Lab Results  Component Value Date   ALT 20 07/10/2013   AST 18 07/10/2013   ALKPHOS 99 07/10/2013   BILITOT 0.2* 07/10/2013   Lab Results  Component Value Date   CHOL 184 07/11/2012   Lab Results  Component Value Date   HDL 30.20* 07/11/2012   Lab Results  Component Value Date   LDLCALC 128* 07/11/2012   Lab Results  Component Value Date  TRIG 128.0 07/11/2012   Lab Results  Component Value Date   CHOLHDL  6 07/11/2012     Assessment & Plan  Anxiety state, unspecified Having anxiety attacks with palpitations and agitation at times and trouble sleeping, his daughter is with him and after discussing options it is agreed we will try infrequent Alprazolam. They are aware of the pssible side effects, sedation, confusion, falls etc but would like to proceed to see if they can help him to stay calm. Reassess at next visit.  Insomnia Encouraged good sleep hygiene such as dark, quiet room. No blue/green glowing lights such as computer screens in bedroom. No alcohol or stimulants in evening. Cut down on caffeine as able. Regular exercise is helpful but not just prior to bed time. May use Alprazolam prn  Tobacco abuse Encouraged complete cessation. Discussed need to quit as relates to risk of numerous cancers, cardiac and pulmonary disease as well as neurologic complications. Counseled for greater than 3 minutes  Acute bronchitis Started on Levaquin and a Medol dosepak. Add Mucinex and probiotics.

## 2013-08-17 NOTE — Assessment & Plan Note (Signed)
Encouraged good sleep hygiene such as dark, quiet room. No blue/green glowing lights such as computer screens in bedroom. No alcohol or stimulants in evening. Cut down on caffeine as able. Regular exercise is helpful but not just prior to bed time. May use Alprazolam prn

## 2013-08-17 NOTE — Assessment & Plan Note (Signed)
Encouraged complete cessation. Discussed need to quit as relates to risk of numerous cancers, cardiac and pulmonary disease as well as neurologic complications. Counseled for greater than 3 minutes

## 2013-08-19 ENCOUNTER — Other Ambulatory Visit: Payer: Self-pay | Admitting: Family Medicine

## 2013-08-21 ENCOUNTER — Ambulatory Visit (HOSPITAL_BASED_OUTPATIENT_CLINIC_OR_DEPARTMENT_OTHER)
Admission: RE | Admit: 2013-08-21 | Discharge: 2013-08-21 | Disposition: A | Discharge status: Home / Self Care | Payer: Medicare Other | Source: Ambulatory Visit | Attending: Hematology & Oncology | Admitting: Hematology & Oncology

## 2013-08-21 ENCOUNTER — Ambulatory Visit (HOSPITAL_BASED_OUTPATIENT_CLINIC_OR_DEPARTMENT_OTHER): Payer: Medicare Other | Admitting: Lab

## 2013-08-21 ENCOUNTER — Encounter (HOSPITAL_BASED_OUTPATIENT_CLINIC_OR_DEPARTMENT_OTHER): Payer: Self-pay

## 2013-08-21 ENCOUNTER — Encounter: Payer: Self-pay | Admitting: Hematology & Oncology

## 2013-08-21 ENCOUNTER — Ambulatory Visit (HOSPITAL_BASED_OUTPATIENT_CLINIC_OR_DEPARTMENT_OTHER): Payer: Medicare Other | Admitting: Hematology & Oncology

## 2013-08-21 VITALS — BP 105/66 | HR 51 | Temp 97.6°F | Resp 20 | Ht 68.0 in | Wt 198.0 lb

## 2013-08-21 DIAGNOSIS — R059 Cough, unspecified: Secondary | ICD-10-CM

## 2013-08-21 DIAGNOSIS — Z87891 Personal history of nicotine dependence: Secondary | ICD-10-CM | POA: Diagnosis not present

## 2013-08-21 DIAGNOSIS — C384 Malignant neoplasm of pleura: Secondary | ICD-10-CM | POA: Insufficient documentation

## 2013-08-21 DIAGNOSIS — R109 Unspecified abdominal pain: Secondary | ICD-10-CM

## 2013-08-21 DIAGNOSIS — R05 Cough: Secondary | ICD-10-CM

## 2013-08-21 DIAGNOSIS — C450 Mesothelioma of pleura: Secondary | ICD-10-CM

## 2013-08-21 DIAGNOSIS — C3490 Malignant neoplasm of unspecified part of unspecified bronchus or lung: Secondary | ICD-10-CM

## 2013-08-21 DIAGNOSIS — F17200 Nicotine dependence, unspecified, uncomplicated: Secondary | ICD-10-CM

## 2013-08-21 LAB — CBC WITH DIFFERENTIAL (CANCER CENTER ONLY)
BASO#: 0 10*3/uL (ref 0.0–0.2)
BASO%: 0.1 % (ref 0.0–2.0)
EOS ABS: 0.1 10*3/uL (ref 0.0–0.5)
EOS%: 1.1 % (ref 0.0–7.0)
HCT: 38.9 % (ref 38.7–49.9)
HEMOGLOBIN: 13.5 g/dL (ref 13.0–17.1)
LYMPH#: 1.2 10*3/uL (ref 0.9–3.3)
LYMPH%: 13.7 % — ABNORMAL LOW (ref 14.0–48.0)
MCH: 36.1 pg — ABNORMAL HIGH (ref 28.0–33.4)
MCHC: 34.7 g/dL (ref 32.0–35.9)
MCV: 104 fL — ABNORMAL HIGH (ref 82–98)
MONO#: 0.5 10*3/uL (ref 0.1–0.9)
MONO%: 5 % (ref 0.0–13.0)
NEUT%: 80.1 % — ABNORMAL HIGH (ref 40.0–80.0)
NEUTROS ABS: 7.2 10*3/uL — AB (ref 1.5–6.5)
PLATELETS: 117 10*3/uL — AB (ref 145–400)
RBC: 3.74 10*6/uL — ABNORMAL LOW (ref 4.20–5.70)
RDW: 13.2 % (ref 11.1–15.7)
WBC: 9 10*3/uL (ref 4.0–10.0)

## 2013-08-21 LAB — CMP (CANCER CENTER ONLY)
ALK PHOS: 73 U/L (ref 26–84)
ALT(SGPT): 32 U/L (ref 10–47)
AST: 24 U/L (ref 11–38)
Albumin: 3.2 g/dL — ABNORMAL LOW (ref 3.3–5.5)
BILIRUBIN TOTAL: 0.4 mg/dL (ref 0.20–1.60)
BUN, Bld: 25 mg/dL — ABNORMAL HIGH (ref 7–22)
CO2: 30 mEq/L (ref 18–33)
Calcium: 8.9 mg/dL (ref 8.0–10.3)
Chloride: 101 mEq/L (ref 98–108)
Creat: 1.3 mg/dl — ABNORMAL HIGH (ref 0.6–1.2)
Glucose, Bld: 132 mg/dL — ABNORMAL HIGH (ref 73–118)
Potassium: 3.6 mEq/L (ref 3.3–4.7)
SODIUM: 145 meq/L (ref 128–145)
TOTAL PROTEIN: 7.5 g/dL (ref 6.4–8.1)

## 2013-08-22 NOTE — Progress Notes (Signed)
Hematology and Oncology Follow Up Visit  Steve [name] [id number] Jan 22, 1933 80 y.o. 08/22/2013   Principle Diagnosis:  1. 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 2. Heterozygote factor V Leiden mutation. 3. Lupus anticoagulant positive. 4. Deep venous thrombosis of the right leg. 5. 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  Current Therapy:    Lovenox 120 mg subcutaneous daily     Interim History:  Mr.  Steve [name] is back for followup. He is in a wheelchair.Marland Kitchen He just really is not getting much better. I am not sure as to why not.  He still smoking. He has a little bit of a cough. Is not bleeding. He has a pain in the left upper quadrant of his abdomen.  He's had some episodes of falling. I am not sure as to why this happened. He just loses his balance.  He did have a chest x-ray today. There is some increased consolidation over on the right lung. This may be from the mesothelioma. We probably will have to check another PET scan on him.  He's had no leg swelling. He's had no fever. He's had no headache.  Overall, his performance status is ECOG 3  Medications: Current outpatient prescriptions:albuterol (PROVENTIL HFA;VENTOLIN HFA) 108 (90 BASE) MCG/ACT inhaler, Inhale 1 puff into the lungs every 6 (six) hours as needed for shortness of breath., Disp: , Rfl: ;  albuterol (PROVENTIL) (2.5 MG/3ML) 0.083% nebulizer solution, Take 3 mLs (2.5 mg total) by nebulization every 6 (six) hours as needed for wheezing., Disp: 360 mL, Rfl: 6 ALPRAZolam (XANAX) 0.25 MG tablet, Take 1 tablet (0.25 mg total) by mouth 2 (two) times daily as needed for anxiety., Disp: 30 tablet, Rfl: 1;  budesonide-formoterol (SYMBICORT) 160-4.5 MCG/ACT inhaler, Inhale 2 puffs into the lungs 2 (two) times daily., Disp: 1 Inhaler, Rfl: 6;  calcium carbonate (OS-CAL - DOSED IN MG OF ELEMENTAL CALCIUM) 1250 MG tablet, Take 2 tablets by mouth 2 (two) times daily., Disp: , Rfl:  Cholecalciferol (VITAMIN  D3) 1000 UNITS CAPS, Take 1 capsule by mouth daily., Disp: , Rfl: ;  enoxaparin (LOVENOX) 120 MG/0.8ML injection, Inject 0.8 mLs (120 mg total) into the skin daily., Disp: 30 Syringe, Rfl: 3;  folic acid (FOLVITE) 1 MG tablet, Take 1 mg by mouth daily., Disp: , Rfl:  HYDROcodone-acetaminophen (NORCO/VICODIN) 5-325 MG per tablet, Take 1 tablet by mouth every 6 (six) hours as needed for moderate pain., Disp: 60 tablet, Rfl: 0;  levothyroxine (SYNTHROID, LEVOTHROID) 150 MCG tablet, Take 150 mcg by mouth daily before breakfast., Disp: , Rfl: ;  lisinopril (PRINIVIL,ZESTRIL) 10 MG tablet, TAKE ONE TABLET BY MOUTH TWICE DAILY, Disp: 60 tablet, Rfl: 0 losartan (COZAAR) 25 MG tablet, TAKE ONE TABLET BY MOUTH ONCE DAILY, Disp: 30 tablet, Rfl: 0;  metoprolol tartrate (LOPRESSOR) 25 MG tablet, TAKE ONE-HALF TABLET BY MOUTH TWICE DAILY, Disp: 30 tablet, Rfl: 0;  ondansetron (ZOFRAN) 8 MG tablet, Take 8 mg by mouth as needed for nausea. , Disp: , Rfl: ;  polyethylene glycol (MIRALAX) packet, Take 17 g by mouth 2 (two) times daily., Disp: 14 each, Rfl: 3 sertraline (ZOLOFT) 100 MG tablet, Take 2 tablets (200 mg total) by mouth daily with breakfast., Disp: 180 tablet, Rfl: 1  Allergies:  Allergies  Allergen Reactions  . Lyrica [Pregabalin] Hives, Itching and Rash    Past Medical History, Surgical history, Social history, and Family History were reviewed and updated.  Review of Systems: As above  Physical Exam:  height is 5\' 8"  (  1.727 m) and weight is 198 lb (89.812 kg). His oral temperature is 97.6 F (36.4 C). His blood pressure is 105/66 and his pulse is 51. His respiration is 20.   Somewhat ill-appearing white done. Head and neck exam shows no ocular or oral lesions. He has no palpable cervical or supraclavicular lymph nodes. Lungs are with some slight decrease on the right side. Left side is clear. Cardiac exam regular in rhythm. Abdomen soft. Has no fluid wave. There maybe some slight tenderness to  palpation in the left upper quadrant. There is no spleen tip. Extremities shows some chronic edema in his lower legs. Skin exam shows scattered ecchymoses. Neurological exam is nonfocal.  Lab Results  Component Value Date   WBC 9.0 08/21/2013   HGB 13.5 08/21/2013   HCT 38.9 08/21/2013   MCV 104* 08/21/2013   PLT 117* 08/21/2013     Chemistry      Component Value Date/Time   NA 145 08/21/2013 1407   NA 142 07/10/2013 2117   NA 146* 04/14/2013 1312   K 3.6 08/21/2013 1407   K 4.2 07/10/2013 2117   K 4.0 04/14/2013 1312   CL 101 08/21/2013 1407   CL 101 07/10/2013 2117   CO2 30 08/21/2013 1407   CO2 26 07/10/2013 2117   CO2 29 04/14/2013 1312   BUN 25* 08/21/2013 1407   BUN 25* 07/10/2013 2117   BUN 21.2 04/14/2013 1312   CREATININE 1.3* 08/21/2013 1407   CREATININE 1.20 07/10/2013 2117   CREATININE 1.0 04/14/2013 1312      Component Value Date/Time   CALCIUM 8.9 08/21/2013 1407   CALCIUM 9.1 07/10/2013 2117   CALCIUM 8.8 04/14/2013 1312   ALKPHOS 73 08/21/2013 1407   ALKPHOS 99 07/10/2013 2117   ALKPHOS 75 04/14/2013 1312   AST 24 08/21/2013 1407   AST 18 07/10/2013 2117   AST 17 04/14/2013 1312   ALT 32 08/21/2013 1407   ALT 20 07/10/2013 2117   ALT 26 04/14/2013 1312   BILITOT 0.40 08/21/2013 1407   BILITOT 0.2* 07/10/2013 2117   BILITOT 0.43 04/14/2013 1312         Impression and Plan: [name] is 80 year old gentleman. He has a mesothelioma of the right lung. We tried him on chemotherapy. He just cannot tolerate this well. He did have a response. I'm not sure how long this responsible last. We will repeat a PET scan on him.  I want him to stay on the Lovenox. Unfortunately this is causing them quite well morning. Another why we cannot get his easier for him and cheaper for him.  I will not worry about his M spike. This is all reactive. He has an MGUS. Again I think this reflects his other health issues.  I did refill some medications for him.  We'll plan to get him back in another 4 weeks.  I Spent about  45 minutes with he and his family today.   Volanda Napoleon, MD 8/14/20156:02 PM

## 2013-08-25 LAB — PROTEIN ELECTROPHORESIS, SERUM, WITH REFLEX
ALPHA-2-GLOBULIN: 11.6 % (ref 7.1–11.8)
Albumin ELP: 53.2 % — ABNORMAL LOW (ref 55.8–66.1)
Alpha-1-Globulin: 4.9 % (ref 2.9–4.9)
BETA 2: 4.3 % (ref 3.2–6.5)
Beta Globulin: 16.1 % — ABNORMAL HIGH (ref 4.7–7.2)
GAMMA GLOBULIN: 9.9 % — AB (ref 11.1–18.8)
M-SPIKE, %: 0.65 g/dL
Total Protein, Serum Electrophoresis: 7 g/dL (ref 6.0–8.3)

## 2013-08-25 LAB — IGG, IGA, IGM
IGA: 957 mg/dL — AB (ref 68–379)
IGG (IMMUNOGLOBIN G), SERUM: 646 mg/dL — AB (ref 650–1600)
IGM, SERUM: 97 mg/dL (ref 41–251)

## 2013-08-25 LAB — KAPPA/LAMBDA LIGHT CHAINS
KAPPA LAMBDA RATIO: 0.34 (ref 0.26–1.65)
Kappa free light chain: 2.68 mg/dL — ABNORMAL HIGH (ref 0.33–1.94)
LAMBDA FREE LGHT CHN: 7.79 mg/dL — AB (ref 0.57–2.63)

## 2013-08-25 LAB — IFE INTERPRETATION

## 2013-09-17 ENCOUNTER — Other Ambulatory Visit: Payer: Self-pay | Admitting: Family Medicine

## 2013-09-18 ENCOUNTER — Encounter (HOSPITAL_COMMUNITY): Payer: Medicare Other

## 2013-09-19 ENCOUNTER — Telehealth: Payer: Self-pay | Admitting: Hematology & Oncology

## 2013-09-19 NOTE — Telephone Encounter (Signed)
Pt moved 9-10 PET to 9-18 he was sick. MD from 9-15 to 10-13. I left RN message to check with MD on follow up date

## 2013-09-20 ENCOUNTER — Emergency Department (HOSPITAL_BASED_OUTPATIENT_CLINIC_OR_DEPARTMENT_OTHER)
Admission: EM | Admit: 2013-09-20 | Discharge: 2013-09-20 | Disposition: A | Discharge status: Home / Self Care | Payer: Medicare Other | Attending: Emergency Medicine | Admitting: Emergency Medicine

## 2013-09-20 ENCOUNTER — Emergency Department (HOSPITAL_BASED_OUTPATIENT_CLINIC_OR_DEPARTMENT_OTHER): Payer: Medicare Other

## 2013-09-20 ENCOUNTER — Encounter (HOSPITAL_BASED_OUTPATIENT_CLINIC_OR_DEPARTMENT_OTHER): Payer: Self-pay | Admitting: Emergency Medicine

## 2013-09-20 DIAGNOSIS — F329 Major depressive disorder, single episode, unspecified: Secondary | ICD-10-CM | POA: Insufficient documentation

## 2013-09-20 DIAGNOSIS — S4980XA Other specified injuries of shoulder and upper arm, unspecified arm, initial encounter: Secondary | ICD-10-CM | POA: Diagnosis present

## 2013-09-20 DIAGNOSIS — J449 Chronic obstructive pulmonary disease, unspecified: Secondary | ICD-10-CM | POA: Insufficient documentation

## 2013-09-20 DIAGNOSIS — Y9389 Activity, other specified: Secondary | ICD-10-CM | POA: Diagnosis not present

## 2013-09-20 DIAGNOSIS — F411 Generalized anxiety disorder: Secondary | ICD-10-CM | POA: Diagnosis not present

## 2013-09-20 DIAGNOSIS — I10 Essential (primary) hypertension: Secondary | ICD-10-CM | POA: Diagnosis not present

## 2013-09-20 DIAGNOSIS — S40029A Contusion of unspecified upper arm, initial encounter: Secondary | ICD-10-CM | POA: Insufficient documentation

## 2013-09-20 DIAGNOSIS — Y929 Unspecified place or not applicable: Secondary | ICD-10-CM | POA: Insufficient documentation

## 2013-09-20 DIAGNOSIS — F3289 Other specified depressive episodes: Secondary | ICD-10-CM | POA: Insufficient documentation

## 2013-09-20 DIAGNOSIS — Z8701 Personal history of pneumonia (recurrent): Secondary | ICD-10-CM | POA: Insufficient documentation

## 2013-09-20 DIAGNOSIS — S40022A Contusion of left upper arm, initial encounter: Secondary | ICD-10-CM

## 2013-09-20 DIAGNOSIS — K219 Gastro-esophageal reflux disease without esophagitis: Secondary | ICD-10-CM | POA: Insufficient documentation

## 2013-09-20 DIAGNOSIS — F17200 Nicotine dependence, unspecified, uncomplicated: Secondary | ICD-10-CM | POA: Insufficient documentation

## 2013-09-20 DIAGNOSIS — Z87710 Personal history of (corrected) hypospadias: Secondary | ICD-10-CM | POA: Insufficient documentation

## 2013-09-20 DIAGNOSIS — W06XXXA Fall from bed, initial encounter: Secondary | ICD-10-CM | POA: Insufficient documentation

## 2013-09-20 DIAGNOSIS — Z79899 Other long term (current) drug therapy: Secondary | ICD-10-CM | POA: Diagnosis not present

## 2013-09-20 DIAGNOSIS — I4891 Unspecified atrial fibrillation: Secondary | ICD-10-CM | POA: Diagnosis not present

## 2013-09-20 DIAGNOSIS — Z8739 Personal history of other diseases of the musculoskeletal system and connective tissue: Secondary | ICD-10-CM | POA: Insufficient documentation

## 2013-09-20 DIAGNOSIS — Z87448 Personal history of other diseases of urinary system: Secondary | ICD-10-CM | POA: Insufficient documentation

## 2013-09-20 DIAGNOSIS — S46909A Unspecified injury of unspecified muscle, fascia and tendon at shoulder and upper arm level, unspecified arm, initial encounter: Secondary | ICD-10-CM | POA: Insufficient documentation

## 2013-09-20 DIAGNOSIS — J4489 Other specified chronic obstructive pulmonary disease: Secondary | ICD-10-CM | POA: Insufficient documentation

## 2013-09-20 MED ORDER — OXYCODONE-ACETAMINOPHEN 5-325 MG PO TABS: 1.0000 | ORAL_TABLET | ORAL | Status: DC | PRN

## 2013-09-20 MED ORDER — OXYCODONE-ACETAMINOPHEN 5-325 MG PO TABS
1.0000 | ORAL_TABLET | Freq: Once | ORAL | Status: AC
  Administered 2013-09-20: 1 via ORAL
  Filled 2013-09-20: qty 1

## 2013-09-20 NOTE — ED Notes (Signed)
Pt discharged to home with family. NAD.  

## 2013-09-20 NOTE — ED Provider Notes (Signed)
CSN: [id number]     Arrival date & time 09/20/13  1251 History  This chart was scribed for Tanna Furry, MD by Jeanell Sparrow, ED Scribe. This patient was seen in room MH05/MH05 and the patient's care was started at 3:38 PM.   Chief Complaint  Patient presents with  . Fall   The history is provided by the patient. No language interpreter was used.  HPI Comments: Steve [name] is a 80 y.o. male with a hx of mesothelioma who presents to the Emergency Department complaining of a fall that occurred last night. He states that he was trying sit on his bed and he missed it and fell over. He denies any head injury. He reports that he landed on left shoulder with his arm against him. He states that he has associated left shoulder pain and has not been able to use his left arm much today. He reports taking 2 10 mg hydrocodone about 4 hours ago with minimal relief. He denies any neck or back pain.    Past Medical History  Diagnosis Date  . Prostate enlargement   . GERD (gastroesophageal reflux disease)   . AAA (abdominal aortic aneurysm)     stress test 09/14/10 [facility]  . Anxiety   . Recurrent upper respiratory infection (URI)     productive cough- white phlegm- no fever  . Hypertension     chest x ray 12/12 EP[facility]- repeated 06/06/11, EKG 11/12 [facility]  . DVT (deep venous thrombosis) 08/2011  . Pneumonia   . Bruises easily     takes Xarelto  . Depression   . Dysrhythmia     hx of atrial fib post op x 2 days   . Atrial fibrillation   . Mesothelioma   . Arthritis     knee and back  . Sleep apnea      12/12 sleep study,SEVERE per study  Dr Lamonte Sakai- states doesnt wear machine on regular basis- setting BiPAP 9/9  . H/O hiatal hernia   . COPD (chronic obstructive pulmonary disease)     patient uses oxygen 2L/[place] at nite   . Epistaxis 05/13/2012  . Olecranon bursitis of right elbow 06/28/2012   Past Surgical History  Procedure Laterality Date  . Knee surgery  1957    left knee arthotomy  . Abdominal aortic  aneurysm repair  11/14/2010    AAA Repair    Dr Donnetta Hutching  . Transurethral resection of prostate  01/25/2011    TURP  . Incisional hernia repair  06/12/2011    Procedure: HERNIA REPAIR INCISIONAL;  Surgeon: Earnstine Regal, MD;  Location: [facility];  Service: General;  Laterality: N/A;  Open Repair Ventral Incisional Hernia with Mesh  . Hernia repair    . Video assisted thoracoscopy  11/02/2011    Procedure: VIDEO ASSISTED THORACOSCOPY;  Surgeon: Melrose Nakayama, MD;  Location: [place];  Service: Thoracic;  Laterality: Right;  . Pleural biopsy  11/02/2011    Procedure: PLEURAL BIOPSY;  Surgeon: Melrose Nakayama, MD;  Location: [place];  Service: Thoracic;  Laterality: Right;  Parietal and visceral biopsies  . Chest tube insertion  11/02/2011    Procedure: INSERTION PLEURAL DRAINAGE CATHETER;  Surgeon: Melrose Nakayama, MD;  Location: [place];  Service: Thoracic;  Laterality: Right;  . Decortication  11/02/2011    Procedure: DECORTICATION;  Surgeon: Melrose Nakayama, MD;  Location: [place];  Service: Thoracic;  Laterality: Right;  . Pleural effusion drainage  11/02/2011    Procedure:  DRAINAGE OF PLEURAL EFFUSION;  Surgeon: Melrose Nakayama, MD;  Location: [place];  Service: Thoracic;  Laterality: Right;  . Cholecystectomy  01/19/2012    Procedure: LAPAROSCOPIC CHOLECYSTECTOMY WITH INTRAOPERATIVE CHOLANGIOGRAM;  Surgeon: Zenovia Jarred, MD;  Location: [place];  Service: General;  Laterality: N/A;  laparoscopic cholecystectomy with intraoperative cholangiogram  . Ercp  01/22/2012    Procedure: ENDOSCOPIC RETROGRADE CHOLANGIOPANCREATOGRAPHY (ERCP);  Surgeon: Inda Castle, MD;  Location: [place];  Service: Endoscopy;  Laterality: N/A;  with stent placement  . Ercp  01/21/2012    Procedure: ENDOSCOPIC RETROGRADE CHOLANGIOPANCREATOGRAPHY (ERCP);  Surgeon: Milus Banister, MD;  Location: [place];  Service: Gastroenterology;  Laterality: N/A;  . Esophagogastroduodenoscopy  01/24/2012    Procedure:  ESOPHAGOGASTRODUODENOSCOPY (EGD);  Surgeon: Inda Castle, MD;  Location: [place];  Service: Endoscopy;  Laterality: N/A;  remove pancr stent   . Endoscopic retrograde cholangiopancreatography (ercp) with propofol N/A 03/07/2012    Procedure: ENDOSCOPIC RETROGRADE CHOLANGIOPANCREATOGRAPHY (ERCP) WITH PROPOFOL;  Surgeon: Milus Banister, MD;  Location: [facility];  Service: Endoscopy;  Laterality: N/A;  remove stent  . Thyroidectomy N/A 04/04/2012    Procedure:  TOTAL THYROIDECTOMY WITH CENTRAL COMPARTMENT LYMPH NODE DISSECTION;  Surgeon: Earnstine Regal, MD;  Location: [facility];  Service: General;  Laterality: N/A;   Family History  Problem Relation Age of Onset  . Arthritis Mother   . Heart failure Mother   . Hypertension Mother   . Diabetes Mother   . Heart failure Daughter     deceased age 74  . Other Daughter     deceased age 78 MVA   History  Substance Use Topics  . Smoking status: Current Every Day Smoker -- 1.50 packs/day for 60 years    Types: Cigarettes    Start date: 02/12/1954  . Smokeless tobacco: Never Used     Comment:   07-08-13  still smoking daily  . Alcohol Use: No    Review of Systems  Constitutional: Negative for fever, chills, diaphoresis, appetite change and fatigue.  HENT: Negative for mouth sores, sore throat and trouble swallowing.   Eyes: Negative for visual disturbance.  Respiratory: Negative for cough, chest tightness, shortness of breath and wheezing.   Cardiovascular: Negative for chest pain.  Gastrointestinal: Negative for nausea, vomiting, abdominal pain, diarrhea and abdominal distention.  Endocrine: Negative for polydipsia, polyphagia and polyuria.  Genitourinary: Negative for dysuria, frequency and hematuria.  Musculoskeletal: Positive for myalgias. Negative for back pain, gait problem and neck pain.  Skin: Negative for color change, pallor and rash.  Neurological: Negative for dizziness, syncope, light-headedness and headaches.   Hematological: Does not bruise/bleed easily.  Psychiatric/Behavioral: Negative for behavioral problems and confusion.    Allergies  Lyrica  Home Medications   Prior to Admission medications   Medication Sig Start Date End Date Taking? Authorizing Provider  albuterol (PROVENTIL HFA;VENTOLIN HFA) 108 (90 BASE) MCG/ACT inhaler Inhale 1 puff into the lungs every 6 (six) hours as needed for shortness of breath.    Historical Provider, MD  albuterol (PROVENTIL) (2.5 MG/3ML) 0.083% nebulizer solution Take 3 mLs (2.5 mg total) by nebulization every 6 (six) hours as needed for wheezing. 03/24/13   Collene Gobble, MD  ALPRAZolam Duanne Moron) 0.25 MG tablet Take 1 tablet (0.25 mg total) by mouth 2 (two) times daily as needed for anxiety. 08/12/13   Mosie Lukes, MD  budesonide-formoterol [facility]) 160-4.5 MCG/ACT inhaler Inhale 2 puffs into the lungs 2 (two) times daily. 03/24/13  Collene Gobble, MD  calcium carbonate (OS-CAL - DOSED IN MG OF ELEMENTAL CALCIUM) 1250 MG tablet Take 2 tablets by mouth 2 (two) times daily. 04/05/12   Armandina Gemma, MD  Cholecalciferol (VITAMIN D3) 1000 UNITS CAPS Take 1 capsule by mouth daily.    Historical Provider, MD  enoxaparin (LOVENOX) 120 MG/0.8ML injection Inject 0.8 mLs (120 mg total) into the skin daily. 05/19/13   Volanda Napoleon, MD  folic acid (FOLVITE) 1 MG tablet Take 1 mg by mouth daily. 01/23/13   Volanda Napoleon, MD  HYDROcodone-acetaminophen (NORCO/VICODIN) 5-325 MG per tablet Take 1 tablet by mouth every 6 (six) hours as needed for moderate pain. 05/30/13   Volanda Napoleon, MD  levothyroxine (SYNTHROID, LEVOTHROID) 150 MCG tablet Take 150 mcg by mouth daily before breakfast.    Historical Provider, MD  lisinopril (PRINIVIL,ZESTRIL) 10 MG tablet TAKE ONE TABLET BY MOUTH TWICE DAILY 09/17/13   Mosie Lukes, MD  losartan (COZAAR) 25 MG tablet TAKE ONE TABLET BY MOUTH ONCE DAILY 08/19/13   Mosie Lukes, MD  metoprolol tartrate (LOPRESSOR) 25 MG tablet TAKE ONE-HALF  TABLET BY MOUTH TWICE DAILY 08/12/13   Mosie Lukes, MD  ondansetron (ZOFRAN) 8 MG tablet Take 8 mg by mouth as needed for nausea.  04/21/13   Historical Provider, MD  oxyCODONE-acetaminophen (PERCOCET/ROXICET) 5-325 MG per tablet Take 1 tablet by mouth every 4 (four) hours as needed. 09/20/13   Tanna Furry, MD  polyethylene glycol [facility]) packet Take 17 g by mouth 2 (two) times daily. 04/07/13   Costin Karlyne Greenspan, MD  sertraline (ZOLOFT) 100 MG tablet Take 2 tablets (200 mg total) by mouth daily with breakfast. 08/12/13   Mosie Lukes, MD   BP 97/47  Pulse 57  Temp(Src) 97.8 F (36.6 C)  Resp 18  Wt 195 lb (88.451 kg)  SpO2 92% Physical Exam  Nursing note and vitals reviewed. Constitutional: He is oriented to person, place, and time. He appears well-developed and well-nourished. No distress.  HENT:  Head: Normocephalic.  Eyes: Conjunctivae are normal. Pupils are equal, round, and reactive to light. No scleral icterus.  Neck: Normal range of motion. Neck supple. No thyromegaly present.  Cardiovascular: Normal rate and regular rhythm.  Exam reveals no gallop and no friction rub.   No murmur heard. Pulmonary/Chest: Effort normal and breath sounds normal. No respiratory distress. He has no wheezes. He has no rales.  Abdominal: Soft. Bowel sounds are normal. He exhibits no distension. There is no tenderness. There is no rebound.  Musculoskeletal: He exhibits tenderness.  Non tender over anterior rib and clavicle. Tender over spine of scapula. Painful arch with adduction on left arm pointing to deltoid tubercle. Flexion, extension, and moves elbow without pain. TTP Tricep and deltoid tubercle. Normal neuro and vascular exam.  Neurological: He is alert and oriented to person, place, and time.  Skin: Skin is warm and dry. No rash noted.  Psychiatric: He has a normal mood and affect. His behavior is normal.    ED Course  Procedures (including critical care time) DIAGNOSTIC STUDIES: Oxygen  Saturation is 92% on RA, normal by my interpretation.    COORDINATION OF CARE: 3:43 PM- Pt advised of plan for treatment which includes medication and radiology and pt agrees.  Labs Review Labs Reviewed - No data to display  Imaging Review No results found.   EKG Interpretation None      MDM   Final diagnoses:  Arm contusion, left, initial encounter  I personally performed the services described in this documentation, which was scribed in my presence. The recorded information has been reviewed and is accurate.     Tanna Furry, MD 09/25/13 [phone]

## 2013-09-20 NOTE — ED Notes (Signed)
Patient here with fall while attempting to get into bed last pm, no oc. Complains of pain from left shoulder to entire arm, no obvious deformity

## 2013-09-20 NOTE — Discharge Instructions (Signed)
Apply ice to sore area for 20 minutes, three times per day. Use sling for comfort.  Contusion A contusion is a deep bruise. Contusions happen when an injury causes bleeding under the skin. Signs of bruising include pain, puffiness (swelling), and discolored skin. The contusion may turn blue, purple, or yellow. HOME CARE   Put ice on the injured area.  Put ice in a plastic bag.  Place a towel between your skin and the bag.  Leave the ice on for 15-20 minutes, 03-04 times a day.  Only take medicine as told by your doctor.  Rest the injured area.  If possible, raise (elevate) the injured area to lessen puffiness. GET HELP RIGHT AWAY IF:   You have more bruising or puffiness.  You have pain that is getting worse.  Your puffiness or pain is not helped by medicine. MAKE SURE YOU:   Understand these instructions.  Will watch your condition.  Will get help right away if you are not doing well or get worse. Document Released: 06/14/2007 Document Revised: 03/20/2011 Document Reviewed: 10/31/2010 [facility] Patient Information 2015 [place], [place]. This information is not intended to replace advice given to you by your health care provider. Make sure you discuss any questions you have with your health care provider.

## 2013-09-23 ENCOUNTER — Other Ambulatory Visit: Payer: Medicare Other | Admitting: Lab

## 2013-09-23 ENCOUNTER — Ambulatory Visit: Payer: Medicare Other | Admitting: Hematology & Oncology

## 2013-09-26 ENCOUNTER — Telehealth: Payer: Self-pay | Admitting: *Deleted

## 2013-09-26 ENCOUNTER — Encounter (HOSPITAL_COMMUNITY): Payer: Medicare Other

## 2013-09-26 NOTE — Telephone Encounter (Signed)
Referral made to [place]

## 2013-09-29 ENCOUNTER — Ambulatory Visit (HOSPITAL_BASED_OUTPATIENT_CLINIC_OR_DEPARTMENT_OTHER)
Admission: RE | Admit: 2013-09-29 | Discharge: 2013-09-29 | Disposition: A | Discharge status: Home / Self Care | Payer: Medicare Other | Source: Ambulatory Visit | Attending: Medical | Admitting: Medical

## 2013-09-29 ENCOUNTER — Encounter: Payer: Self-pay | Admitting: Medical

## 2013-09-29 ENCOUNTER — Ambulatory Visit (INDEPENDENT_AMBULATORY_CARE_PROVIDER_SITE_OTHER): Admitting: Medical

## 2013-09-29 VITALS — BP 156/74 | HR 89 | Temp 97.8°F | Ht 68.0 in | Wt 200.8 lb

## 2013-09-29 DIAGNOSIS — M25522 Pain in left elbow: Secondary | ICD-10-CM

## 2013-09-29 DIAGNOSIS — M25529 Pain in unspecified elbow: Secondary | ICD-10-CM

## 2013-09-29 DIAGNOSIS — M19019 Primary osteoarthritis, unspecified shoulder: Secondary | ICD-10-CM | POA: Diagnosis not present

## 2013-09-29 DIAGNOSIS — R05 Cough: Secondary | ICD-10-CM | POA: Insufficient documentation

## 2013-09-29 DIAGNOSIS — M25519 Pain in unspecified shoulder: Secondary | ICD-10-CM | POA: Insufficient documentation

## 2013-09-29 DIAGNOSIS — R059 Cough, unspecified: Secondary | ICD-10-CM

## 2013-09-29 DIAGNOSIS — M25512 Pain in left shoulder: Secondary | ICD-10-CM

## 2013-09-29 DIAGNOSIS — R062 Wheezing: Secondary | ICD-10-CM | POA: Insufficient documentation

## 2013-09-29 MED ORDER — PREDNISONE 20 MG PO TABS: 20.0000 mg | ORAL_TABLET | Freq: Every day | ORAL | Status: DC

## 2013-09-29 MED ORDER — HYDROCODONE-ACETAMINOPHEN 5-325 MG PO TABS: 1.0000 | ORAL_TABLET | Freq: Four times a day (QID) | ORAL | Status: DC | PRN

## 2013-09-29 NOTE — Assessment & Plan Note (Signed)
Pain since the fall in both the shoulder and humerus on the left side. He had no fracture on initial x-ray 9 days ago. But since she reports pain is moderate to severe and no improvement at all we'll go ahead and repeat x-rays to see if small fracture may have been missed. I decided to give him a short three-day course of prednisone to help with some inflammation and a refill of his hydrocodone. Note prednisone maybe beneficial with his wheezing as he is our using 2 inhalers.

## 2013-09-29 NOTE — Patient Instructions (Addendum)
For your left shoulder and arm pain, I want to repeat the shoulder and humerous since you still report high level of pain. I am prescribing you prednisone and hydrocodone.  For your cough and history of cancer, I want to get a cxr today. Prednisone may help your wheezing as well. Continue inhalers Will make sure no pneumonia.  Follow up in 7 days or as needed.

## 2013-09-29 NOTE — Assessment & Plan Note (Signed)
Get x-ray of the chest. Follow this make sure no pneumonia. See plan for wheezing today.

## 2013-09-29 NOTE — Assessment & Plan Note (Signed)
He has a history of COPD and lung cancer. On 2 inhalers and wheezing not improving. So did prescribe 3 days of prednisone and we'll go ahead and get a chest x-ray to make sure he does not have current pneumonia.

## 2013-09-29 NOTE — Progress Notes (Signed)
   Subjective:    Patient ID: Steve [name], male    DOB: 02/01/1933, 80 y.o.   MRN: [id number]  HPI  Pt fell off side of the bed and landed on his shoulder on the 12th of September. He went to the ED downstairs and not fracture found. But the shoulder still hurts. No fracture that day of shoulder or humerus. Pt is on hydrocodone for the pain.  Pt is coughing now. Pt has cancer rt lung(This is why he is hospice). Pt states his cough is about the same. No fevers, no chills or sweats.   Pt is wheezing more than usual. He is albuteorl and symbicort. Pt is not diabetic. No chest pain or palpitations.     Review of Systems  Constitutional: Negative for fever, chills and fatigue.  HENT: Negative.   Respiratory: Positive for cough and wheezing. Negative for choking, chest tightness and shortness of breath.   Cardiovascular: Negative for chest pain and palpitations.  Gastrointestinal: Negative.   Musculoskeletal:       Left shoulder and humerus region pain. No neck pain reported and no elbow pain.  Skin:       Small bruise above the elbow medial aspect distal humerus region.  Neurological: Negative.   Hematological: Negative for adenopathy. Does not bruise/bleed easily.  Psychiatric/Behavioral: Negative.        Objective:   Physical Exam  Constitutional: He is oriented to person, place, and time. He appears well-developed and well-nourished. No distress.  HENT:  Head: Normocephalic and atraumatic.  Eyes: Conjunctivae and EOM are normal. Pupils are equal, round, and reactive to light.  Neck: Normal range of motion. Neck supple. No JVD present. No tracheal deviation present. No thyromegaly present.  Cardiovascular: Normal rate, regular rhythm and normal heart sounds.   Pulmonary/Chest: Effort normal and breath sounds normal. No stridor. No respiratory distress. He has no wheezes. He has no rales. He exhibits no tenderness.  Although his right lung fields do sound slightly diminished  compared to left. Note he states the right side is where his cancer is.  Abdominal: Soft. Bowel sounds are normal. He exhibits no distension and no mass. There is no tenderness. There is no rebound.  Musculoskeletal:  Left shoulder-lateral aspect of the shoulder has mild pain on palpation. He does exhibit good range of motion with no obvious crepitus but does report pain. Mid aspect of the humerus little bit tender to palpation.  Left elbow-no pain on flexion and extension. He does have small bruise medial aspect the distal humerus/elbow region. But this area is not tender. On auscultation of the medial upper arm he has no bruits.  No swelling of the forearm.  Lymphadenopathy:    He has no cervical adenopathy.  Neurological: He is alert and oriented to person, place, and time. A cranial nerve deficit is present.  Skin: He is not diaphoretic.  Psychiatric: He has a normal mood and affect. His behavior is normal. Judgment and thought content normal.          Assessment & Plan:

## 2013-09-30 ENCOUNTER — Telehealth: Payer: Self-pay

## 2013-09-30 ENCOUNTER — Telehealth: Payer: Self-pay | Admitting: Family Medicine

## 2013-09-30 MED ORDER — PREDNISONE 20 MG PO TABS: ORAL_TABLET | ORAL | Status: DC

## 2013-09-30 NOTE — Telephone Encounter (Signed)
Order verification for Prednisone received from Dr. Etter Sjogren.  New prescription sent to [facility] on [address].  Called [facility] to verify that they have received it.  Pharm tech stated that they have.  Wife, Lelon Frohlich, made aware.  Lelon Frohlich also inquired about results of x-rays.  Lelon Frohlich is on pt's DPR.  Results were reviewed.  Wife stated understanding. No further questions or concerns voiced.

## 2013-09-30 NOTE — Telephone Encounter (Signed)
Ann Spouse   Ann had been by [employer] to pick up medicine for Steve [name] and they stated they are waiting on [place] to call back and confirm directions. They said we sent in two ways for patient to take.

## 2013-09-30 NOTE — Telephone Encounter (Signed)
Please advise patients daughter these results were sent to [facility]

## 2013-09-30 NOTE — Telephone Encounter (Signed)
Dorian Pod Daughter [phone]  Dorian Pod called to get results from Xrays taking yesterday

## 2013-09-30 NOTE — Telephone Encounter (Signed)
Caller name: Dorian Pod Relation to pt: daughter Call back number: [phone] Pharmacy:  Reason for call:   Patient daughter is requesting x-ray results

## 2013-10-01 ENCOUNTER — Other Ambulatory Visit: Payer: Self-pay | Admitting: Family Medicine

## 2013-10-01 NOTE — Telephone Encounter (Signed)
Informed patient wife of this.

## 2013-10-02 NOTE — Telephone Encounter (Signed)
RX faxed

## 2013-10-02 NOTE — Telephone Encounter (Signed)
Please advise rx refill?  Last rx was done on 08-12-13 quantity 30 with 1 refill (rx is wrote for 2 tab daily prn)  If ok fax to [phone]

## 2013-10-06 ENCOUNTER — Other Ambulatory Visit: Payer: Self-pay | Admitting: Nurse Practitioner

## 2013-10-06 DIAGNOSIS — C450 Mesothelioma of pleura: Secondary | ICD-10-CM

## 2013-10-06 MED ORDER — ENOXAPARIN SODIUM 120 MG/0.8ML ~~LOC~~ SOLN: 120.0000 mg | SUBCUTANEOUS | Status: AC

## 2013-10-06 NOTE — Assessment & Plan Note (Signed)
Please continue your spiriva  We will start Symbicort 2 puffs twice a day  Continue to use albuterol nebulized as needed. We will order these for you and change your DME company from [place] to [place] Follow with Dr Lamonte Sakai in 1 month

## 2013-10-14 ENCOUNTER — Other Ambulatory Visit (HOSPITAL_BASED_OUTPATIENT_CLINIC_OR_DEPARTMENT_OTHER): Admitting: Lab

## 2013-10-14 ENCOUNTER — Ambulatory Visit (HOSPITAL_BASED_OUTPATIENT_CLINIC_OR_DEPARTMENT_OTHER): Admitting: Hematology & Oncology

## 2013-10-14 ENCOUNTER — Encounter: Payer: Self-pay | Admitting: Hematology & Oncology

## 2013-10-14 VITALS — BP 102/49 | HR 61 | Temp 97.4°F | Resp 20 | Ht 68.0 in | Wt 200.0 lb

## 2013-10-14 DIAGNOSIS — C450 Mesothelioma of pleura: Secondary | ICD-10-CM

## 2013-10-14 DIAGNOSIS — I82401 Acute embolism and thrombosis of unspecified deep veins of right lower extremity: Secondary | ICD-10-CM

## 2013-10-14 DIAGNOSIS — Z720 Tobacco use: Secondary | ICD-10-CM

## 2013-10-14 LAB — CBC WITH DIFFERENTIAL (CANCER CENTER ONLY)
BASO#: 0 10*3/uL (ref 0.0–0.2)
BASO%: 0.1 % (ref 0.0–2.0)
EOS ABS: 0.1 10*3/uL (ref 0.0–0.5)
EOS%: 1.2 % (ref 0.0–7.0)
HCT: 38.2 % — ABNORMAL LOW (ref 38.7–49.9)
HGB: 13.1 g/dL (ref 13.0–17.1)
LYMPH#: 1 10*3/uL (ref 0.9–3.3)
LYMPH%: 9.3 % — AB (ref 14.0–48.0)
MCH: 34.6 pg — ABNORMAL HIGH (ref 28.0–33.4)
MCHC: 34.3 g/dL (ref 32.0–35.9)
MCV: 101 fL — AB (ref 82–98)
MONO#: 0.4 10*3/uL (ref 0.1–0.9)
MONO%: 4.2 % (ref 0.0–13.0)
NEUT%: 85.2 % — ABNORMAL HIGH (ref 40.0–80.0)
NEUTROS ABS: 8.8 10*3/uL — AB (ref 1.5–6.5)
PLATELETS: 137 10*3/uL — AB (ref 145–400)
RBC: 3.79 10*6/uL — AB (ref 4.20–5.70)
RDW: 14.1 % (ref 11.1–15.7)
WBC: 10.3 10*3/uL — AB (ref 4.0–10.0)

## 2013-10-14 LAB — CMP (CANCER CENTER ONLY)
ALBUMIN: 3.1 g/dL — AB (ref 3.3–5.5)
ALK PHOS: 68 U/L (ref 26–84)
ALT: 29 U/L (ref 10–47)
AST: 15 U/L (ref 11–38)
BILIRUBIN TOTAL: 0.5 mg/dL (ref 0.20–1.60)
BUN, Bld: 35 mg/dL — ABNORMAL HIGH (ref 7–22)
CO2: 26 meq/L (ref 18–33)
Calcium: 8.6 mg/dL (ref 8.0–10.3)
Chloride: 99 mEq/L (ref 98–108)
Creat: 1.5 mg/dl — ABNORMAL HIGH (ref 0.6–1.2)
Glucose, Bld: 146 mg/dL — ABNORMAL HIGH (ref 73–118)
Potassium: 3.7 mEq/L (ref 3.3–4.7)
SODIUM: 140 meq/L (ref 128–145)
Total Protein: 7.4 g/dL (ref 6.4–8.1)

## 2013-10-14 MED ORDER — MORPHINE SULFATE (CONCENTRATE) 20 MG/ML PO SOLN: 10.0000 mg | ORAL | Status: AC | PRN

## 2013-10-15 NOTE — Progress Notes (Signed)
Hematology and Oncology Follow Up Visit  Steve [name] [id number] 04-May-1933 80 y.o. 10/15/2013   Principle Diagnosis:  1. 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 2. Heterozygote factor V Leiden mutation. 3. Lupus anticoagulant positive. 4. Deep venous thrombosis of the right leg. 5. 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  Current Therapy:    Lovenox 120 mg subcutaneous daily  Hospice care     Interim History:  Steve [name] is back for followup. He is in a wheelchair.Marland Kitchen He feels okay. He has had some falling episodes. He just gets weak in the legs. I'm not sure as to why this is. He has gone to the emergency room a couple times.  He has some pain over on his left upper abdomen. He really does not take anything for this. He says that oxycodone does not work. I will try him on some Roxanol elixir.   He still smoking. He is down to one pack a day. He has a little bit of a cough. He is not bleeding.  Hospice is doing great job with them. They really aren't helping out the patient and his family.   He did have a chest x-ray recently. Everything looked pretty stable. There was not likely to be a progression of the mesothelioma. He's had no leg swelling. He's had no fever. He's had no headache.  Overall, his performance status is ECOG 3  Medications: Current outpatient prescriptions:albuterol (PROVENTIL HFA;VENTOLIN HFA) 108 (90 BASE) MCG/ACT inhaler, Inhale 1 puff into the lungs every 6 (six) hours as needed for shortness of breath., Disp: , Rfl: ;  albuterol (PROVENTIL) (2.5 MG/3ML) 0.083% nebulizer solution, Take 3 mLs (2.5 mg total) by nebulization every 6 (six) hours as needed for wheezing., Disp: 360 mL, Rfl: 6 ALPRAZolam (XANAX) 0.25 MG tablet, TAKE ONE TABLET BY MOUTH TWICE DAILY AS NEEDED FOR ANXIETY, Disp: 60 tablet, Rfl: 1;  budesonide-formoterol (SYMBICORT) 160-4.5 MCG/ACT inhaler, Inhale 2 puffs into the lungs 2 (two) times daily., Disp: 1 Inhaler,  Rfl: 6;  Cholecalciferol (VITAMIN D3) 1000 UNITS CAPS, Take 1 capsule by mouth daily., Disp: , Rfl:  enoxaparin (LOVENOX) 120 MG/0.8ML injection, Inject 0.8 mLs (120 mg total) into the skin daily., Disp: 30 Syringe, Rfl: 3;  levothyroxine (SYNTHROID, LEVOTHROID) 150 MCG tablet, Take 150 mcg by mouth daily before breakfast., Disp: , Rfl: ;  metoprolol tartrate (LOPRESSOR) 25 MG tablet, TAKE ONE-HALF TABLET BY MOUTH TWICE DAILY, Disp: 30 tablet, Rfl: 0;  ondansetron (ZOFRAN) 8 MG tablet, Take 8 mg by mouth as needed for nausea. , Disp: , Rfl:  polyethylene glycol (MIRALAX) packet, Take 17 g by mouth 2 (two) times daily., Disp: 14 each, Rfl: 3;  sertraline (ZOLOFT) 100 MG tablet, Take 2 tablets (200 mg total) by mouth daily with breakfast., Disp: 180 tablet, Rfl: 1;  morphine (ROXANOL) 20 MG/ML concentrated solution, Take 0.5 mLs (10 mg total) by mouth every 4 (four) hours as needed for severe pain., Disp: 60 mL, Rfl: 0  Allergies:  Allergies  Allergen Reactions  . Lyrica [Pregabalin] Hives, Itching and Rash    Past Medical History, Surgical history, Social history, and Family History were reviewed and updated.  Review of Systems: As above  Physical Exam:  height is 5\' 8"  (1.727 m) and weight is 200 lb (90.719 kg). His oral temperature is 97.4 F (36.3 C). His blood pressure is 102/49 and his pulse is 61. His respiration is 20.   Somewhat ill-appearing white done. Head and neck exam shows  no ocular or oral lesions. He has no palpable cervical or supraclavicular lymph nodes. Lungs are with some slight decrease on the right side. Left side is clear. Cardiac exam regular in rhythm. Abdomen soft. Has no fluid wave. There maybe some slight tenderness to palpation in the left upper quadrant. There is no spleen tip. Extremities shows some chronic edema in his lower legs. Skin exam shows scattered ecchymoses. Neurological exam is nonfocal.  Lab Results  Component Value Date   WBC 10.3* 10/14/2013   HGB  13.1 10/14/2013   HCT 38.2* 10/14/2013   MCV 101* 10/14/2013   PLT 137* 10/14/2013     Chemistry      Component Value Date/Time   NA 140 10/14/2013 1132   NA 142 07/10/2013 2117   NA 146* 04/14/2013 1312   K 3.7 10/14/2013 1132   K 4.2 07/10/2013 2117   K 4.0 04/14/2013 1312   CL 99 10/14/2013 1132   CL 101 07/10/2013 2117   CO2 26 10/14/2013 1132   CO2 26 07/10/2013 2117   CO2 29 04/14/2013 1312   BUN 35* 10/14/2013 1132   BUN 25* 07/10/2013 2117   BUN 21.2 04/14/2013 1312   CREATININE 1.5* 10/14/2013 1132   CREATININE 1.20 07/10/2013 2117   CREATININE 1.0 04/14/2013 1312      Component Value Date/Time   CALCIUM 8.6 10/14/2013 1132   CALCIUM 9.1 07/10/2013 2117   CALCIUM 8.8 04/14/2013 1312   ALKPHOS 68 10/14/2013 1132   ALKPHOS 99 07/10/2013 2117   ALKPHOS 75 04/14/2013 1312   AST 15 10/14/2013 1132   AST 18 07/10/2013 2117   AST 17 04/14/2013 1312   ALT 29 10/14/2013 1132   ALT 20 07/10/2013 2117   ALT 26 04/14/2013 1312   BILITOT 0.50 10/14/2013 1132   BILITOT 0.2* 07/10/2013 2117   BILITOT 0.43 04/14/2013 1312         Impression and Plan: Steve [name] is 80 year old gentleman. He has a mesothelioma of the right lung. We tried him on chemotherapy. He just cannot tolerate this well. He did have a response.   So far, he is doing okay from my point of view. He does not appear to be having progressive disease.  He has the history of DVT. He is on Lovenox. We will continue him on the Lovenox.  As far as his monoclonal spike, this would does not bother me right now. I think that we have bigger issues with respect to his mesothelioma.  I want to see him back in about 3-4 weeks.   Volanda Napoleon, MD 10/7/201510:35 AM

## 2013-10-16 ENCOUNTER — Other Ambulatory Visit: Payer: Self-pay | Admitting: Family Medicine

## 2013-10-21 ENCOUNTER — Other Ambulatory Visit: Payer: Medicare Other | Admitting: Lab

## 2013-10-21 ENCOUNTER — Ambulatory Visit: Payer: Medicare Other | Admitting: Hematology & Oncology

## 2013-11-14 ENCOUNTER — Other Ambulatory Visit: Payer: Medicare Other | Admitting: Lab

## 2013-11-14 ENCOUNTER — Ambulatory Visit: Payer: Medicare Other | Admitting: Hematology & Oncology

## 2013-11-18 ENCOUNTER — Encounter: Payer: Self-pay | Admitting: Hematology & Oncology

## 2013-11-18 ENCOUNTER — Ambulatory Visit (HOSPITAL_BASED_OUTPATIENT_CLINIC_OR_DEPARTMENT_OTHER): Payer: Medicare Other | Admitting: Hematology & Oncology

## 2013-11-18 ENCOUNTER — Ambulatory Visit (HOSPITAL_BASED_OUTPATIENT_CLINIC_OR_DEPARTMENT_OTHER)
Admission: RE | Admit: 2013-11-18 | Discharge: 2013-11-18 | Disposition: A | Discharge status: Home / Self Care | Source: Ambulatory Visit | Attending: Hematology & Oncology | Admitting: Hematology & Oncology

## 2013-11-18 ENCOUNTER — Ambulatory Visit (HOSPITAL_BASED_OUTPATIENT_CLINIC_OR_DEPARTMENT_OTHER)

## 2013-11-18 ENCOUNTER — Other Ambulatory Visit (HOSPITAL_BASED_OUTPATIENT_CLINIC_OR_DEPARTMENT_OTHER): Admitting: Lab

## 2013-11-18 VITALS — BP 108/67 | HR 71 | Temp 97.5°F | Resp 20 | Ht 68.0 in | Wt 196.0 lb

## 2013-11-18 DIAGNOSIS — C3490 Malignant neoplasm of unspecified part of unspecified bronchus or lung: Secondary | ICD-10-CM | POA: Insufficient documentation

## 2013-11-18 DIAGNOSIS — C457 Mesothelioma of other sites: Secondary | ICD-10-CM

## 2013-11-18 DIAGNOSIS — C450 Mesothelioma of pleura: Secondary | ICD-10-CM

## 2013-11-18 DIAGNOSIS — C73 Malignant neoplasm of thyroid gland: Secondary | ICD-10-CM

## 2013-11-18 DIAGNOSIS — Z86718 Personal history of other venous thrombosis and embolism: Secondary | ICD-10-CM | POA: Diagnosis not present

## 2013-11-18 DIAGNOSIS — Z720 Tobacco use: Secondary | ICD-10-CM

## 2013-11-18 DIAGNOSIS — R911 Solitary pulmonary nodule: Secondary | ICD-10-CM | POA: Diagnosis not present

## 2013-11-18 DIAGNOSIS — M1460 Charcot's joint, unspecified site: Secondary | ICD-10-CM

## 2013-11-18 LAB — CBC WITH DIFFERENTIAL (CANCER CENTER ONLY)
BASO#: 0 10*3/uL (ref 0.0–0.2)
BASO%: 0.1 % (ref 0.0–2.0)
EOS%: 1 % (ref 0.0–7.0)
Eosinophils Absolute: 0.1 10*3/uL (ref 0.0–0.5)
HCT: 41.9 % (ref 38.7–49.9)
HGB: 13.6 g/dL (ref 13.0–17.1)
LYMPH#: 1 10*3/uL (ref 0.9–3.3)
LYMPH%: 9.3 % — AB (ref 14.0–48.0)
MCH: 33.4 pg (ref 28.0–33.4)
MCHC: 32.5 g/dL (ref 32.0–35.9)
MCV: 103 fL — AB (ref 82–98)
MONO#: 0.5 10*3/uL (ref 0.1–0.9)
MONO%: 4.2 % (ref 0.0–13.0)
NEUT%: 85.4 % — ABNORMAL HIGH (ref 40.0–80.0)
NEUTROS ABS: 9.6 10*3/uL — AB (ref 1.5–6.5)
Platelets: 133 10*3/uL — ABNORMAL LOW (ref 145–400)
RBC: 4.07 10*6/uL — ABNORMAL LOW (ref 4.20–5.70)
RDW: 14.3 % (ref 11.1–15.7)
WBC: 11.2 10*3/uL — ABNORMAL HIGH (ref 4.0–10.0)

## 2013-11-18 LAB — CMP (CANCER CENTER ONLY)
ALT: 30 U/L (ref 10–47)
AST: 17 U/L (ref 11–38)
Albumin: 3.6 g/dL (ref 3.3–5.5)
Alkaline Phosphatase: 98 U/L — ABNORMAL HIGH (ref 26–84)
BILIRUBIN TOTAL: 0.5 mg/dL (ref 0.20–1.60)
BUN, Bld: 20 mg/dL (ref 7–22)
CO2: 28 mEq/L (ref 18–33)
Calcium: 9.4 mg/dL (ref 8.0–10.3)
Chloride: 100 mEq/L (ref 98–108)
Creat: 1.6 mg/dl — ABNORMAL HIGH (ref 0.6–1.2)
Glucose, Bld: 123 mg/dL — ABNORMAL HIGH (ref 73–118)
Potassium: 4.6 mEq/L (ref 3.3–4.7)
Sodium: 145 mEq/L (ref 128–145)
Total Protein: 8 g/dL (ref 6.4–8.1)

## 2013-11-18 LAB — PREALBUMIN: PREALBUMIN: 22.2 mg/dL (ref 17.0–34.0)

## 2013-11-18 MED ORDER — VITAMIN B-6 250 MG PO TABS: 250.0000 mg | ORAL_TABLET | Freq: Every day | ORAL | Status: AC

## 2013-11-18 MED ORDER — CYANOCOBALAMIN 1000 MCG/ML IJ SOLN
INTRAMUSCULAR | Status: AC
  Filled 2013-11-18: qty 1

## 2013-11-18 MED ORDER — CYANOCOBALAMIN 1000 MCG/ML IJ SOLN
1000.0000 ug | Freq: Once | INTRAMUSCULAR | Status: AC
  Administered 2013-11-18: 1000 ug via INTRAMUSCULAR

## 2013-11-18 NOTE — Patient Instructions (Signed)
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
## 2013-12-09 ENCOUNTER — Telehealth: Payer: Self-pay | Admitting: Hematology & Oncology

## 2013-12-09 ENCOUNTER — Other Ambulatory Visit: Admitting: Lab

## 2013-12-09 ENCOUNTER — Ambulatory Visit: Admitting: Hematology & Oncology

## 2013-12-09 NOTE — Telephone Encounter (Signed)
Patient's wife called and cx 12/09/13 apt and stated they would call back in a couple of days to resch.  Steve [name] was notified of cx apt

## 2013-12-12 ENCOUNTER — Other Ambulatory Visit: Payer: Self-pay | Admitting: Family Medicine

## 2013-12-12 ENCOUNTER — Telehealth: Payer: Self-pay | Admitting: *Deleted

## 2013-12-12 NOTE — Telephone Encounter (Signed)
Hospice RN called to say that patient is having multiple falls. Last night he fell into the bathtub and hit his head on the ceramic soap dish. 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 Dr Marin Olp is fine with this. Called and notified RN.

## 2013-12-23 ENCOUNTER — Encounter: Payer: Self-pay | Admitting: Cardiology

## 2013-12-29 ENCOUNTER — Encounter: Payer: Self-pay | Admitting: *Deleted

## 2013-12-29 NOTE — Progress Notes (Signed)
Received notice from hospice that patient passed away in his home on Jan 05, 2014.

## 2013-12-31 IMAGING — RF DG CHOLANGIOGRAM OPERATIVE
1 series · 4 of 4 positions shown · non-contrast
Comparison: None.

CLINICAL DATA: Laparoscopic cholecystectomy

INTRAOPERATIVE CHOLANGIOGRAM
TECHNIQUE: Cholangiographic images from the C-arm fluoroscopic
device were submitted for interpretation post-operatively.  Please
see the procedural report for the amount of contrast and the
fluoroscopy time utilized.

[Series 1: run · 4 of 67 frames shown]
[frame 11/67]
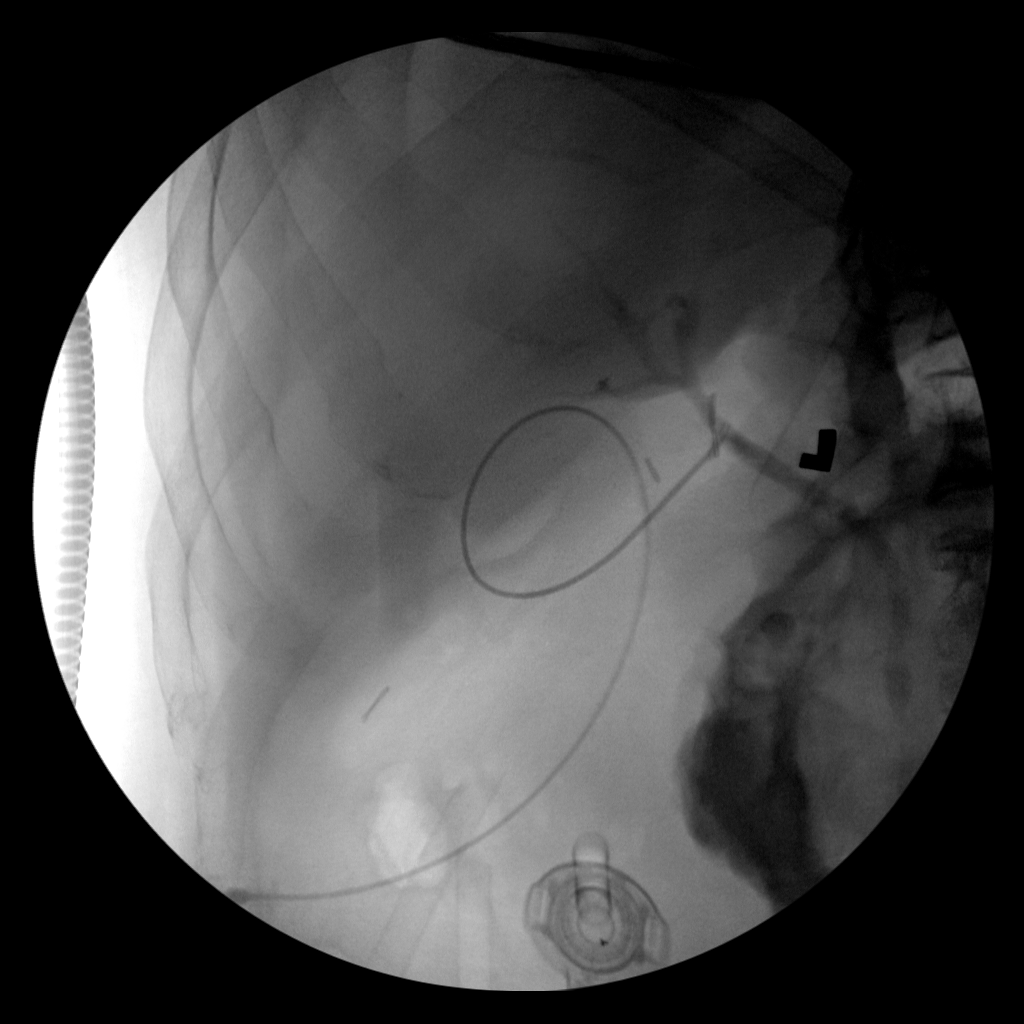
[frame 26/67]
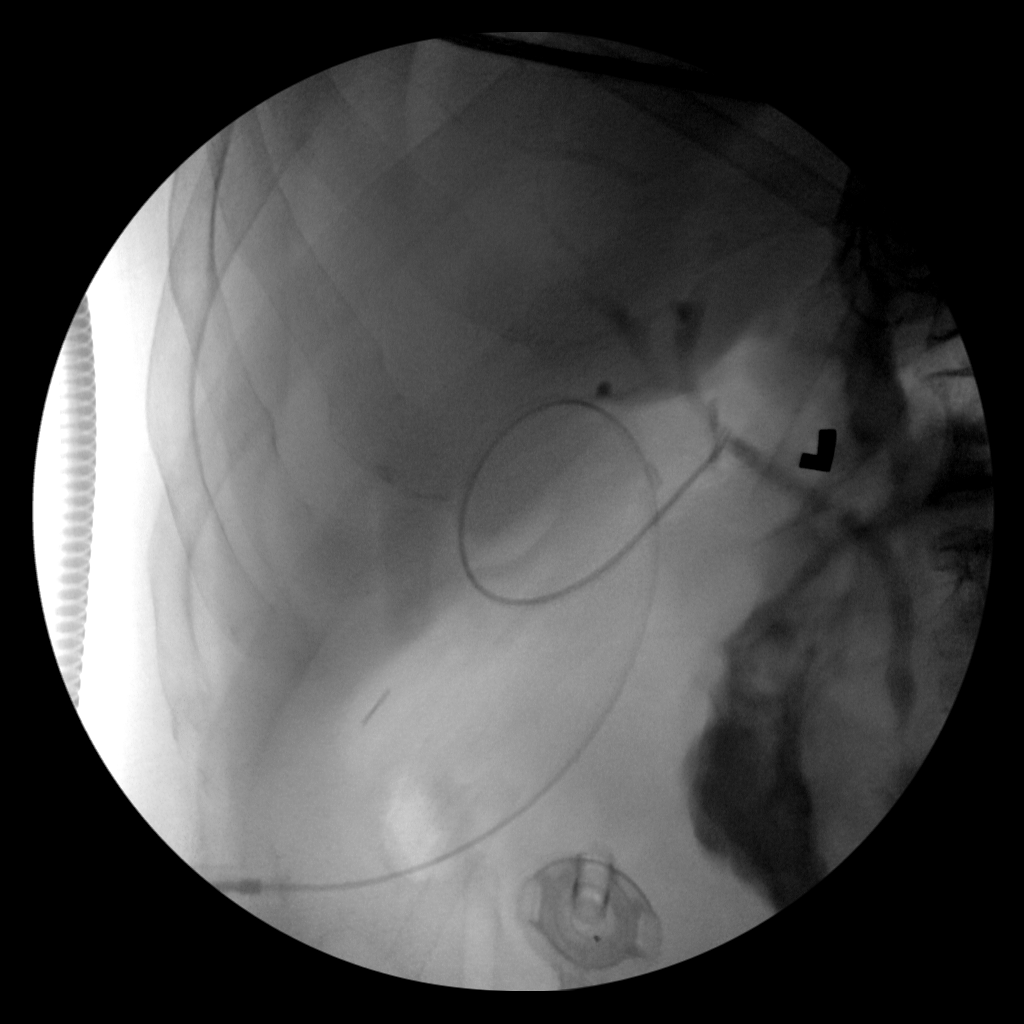
[frame 34/67]
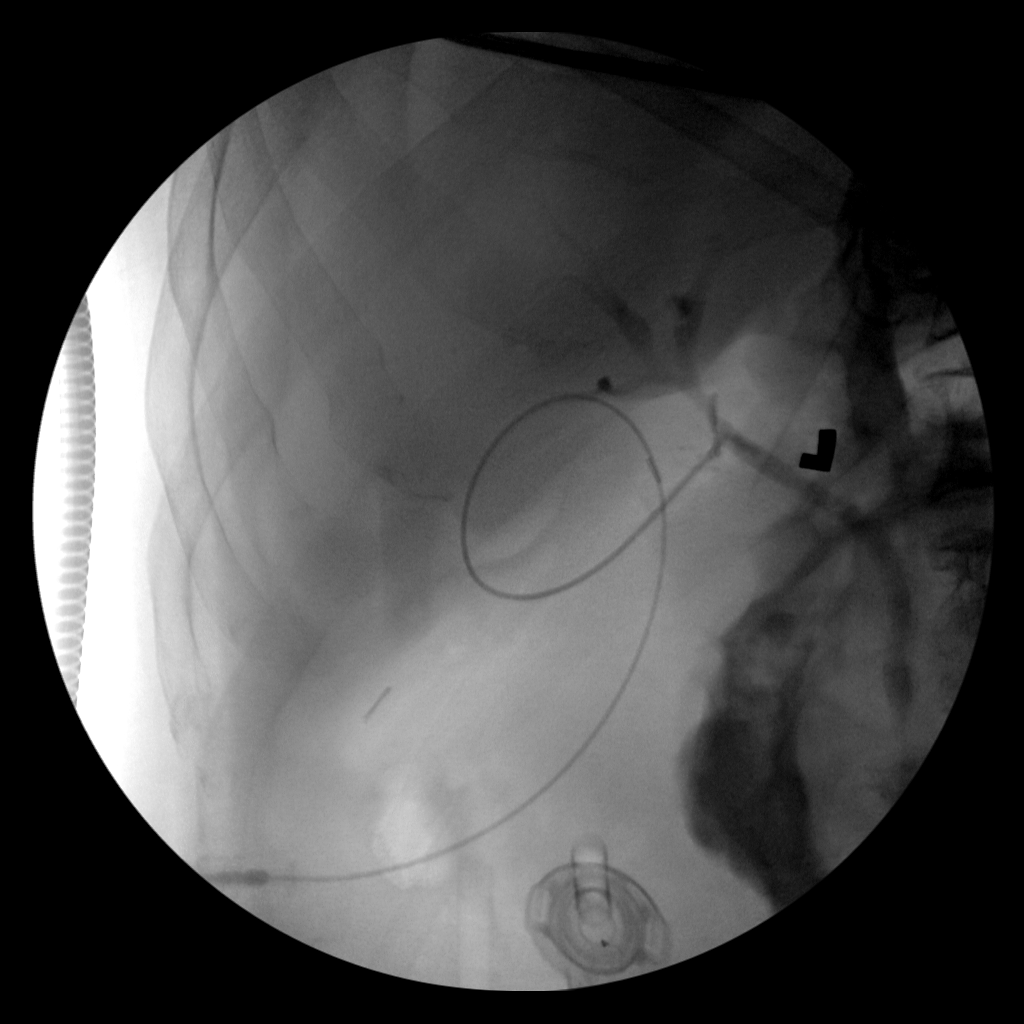
[frame 57/67]
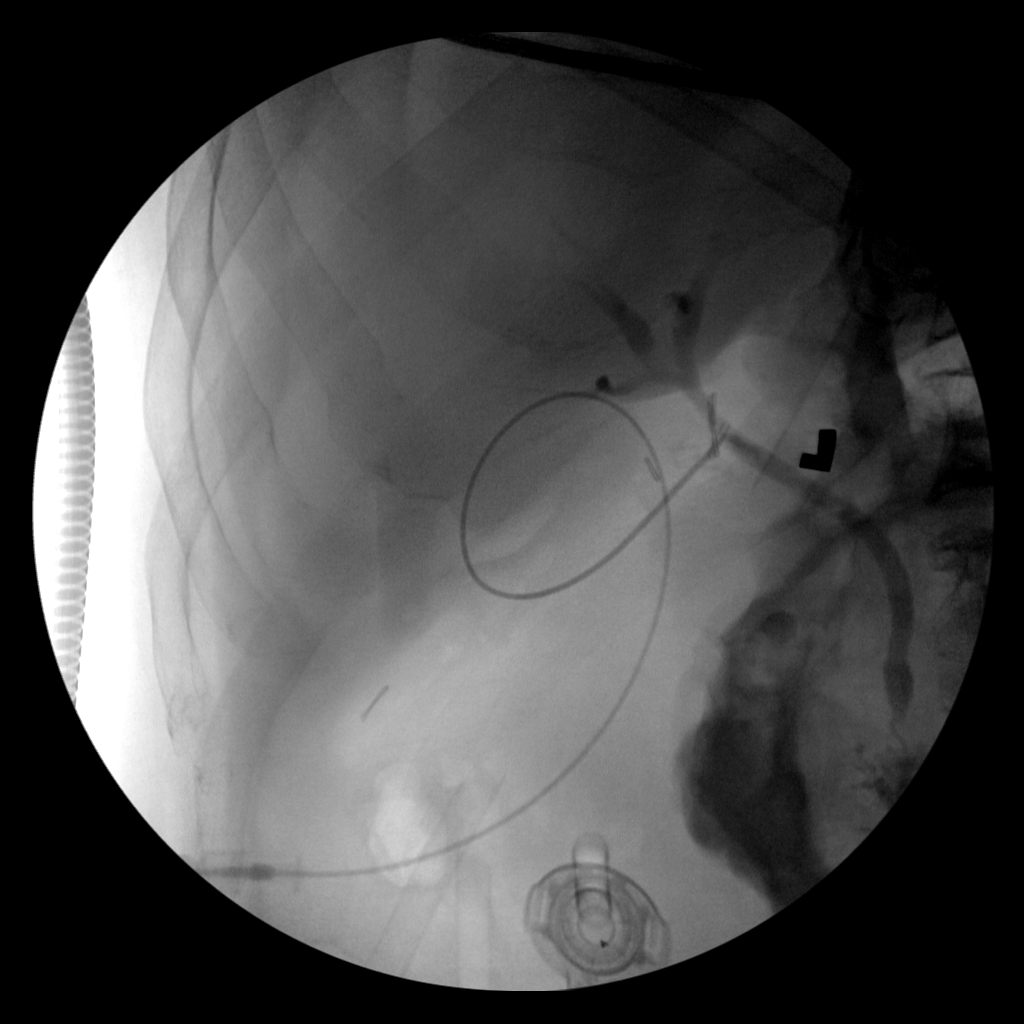

[4 of 4 positions shown; findings below may reference images not displayed]

FINDINGS: Intraoperative cholangiogram demonstrates opacification
of normal caliber common bile duct and intrahepatic ducts.  No
extravasation of contrast.  No evidence for choledocholithiasis.
IMPRESSION: 1.  Normal postoperative cholangiogram status post cholecystectomy.

## 2014-01-09 DEATH — deceased

## 2014-07-06 ENCOUNTER — Other Ambulatory Visit: Payer: Self-pay

## 2015-03-15 IMAGING — CR DG CHEST 1V PORT
1 series · 1 of 1 positions shown · non-contrast
Comparison: NM PET IMAGE RESTAG (PS) SKULL BASE TO THIGH dated
03/03/2013;

CLINICAL DATA: Hypotension. Constipation and extremity weakness.
History of mesothelioma per prior radiology exams.

EXAM:
PORTABLE CHEST - 1 VIEW

[AP]
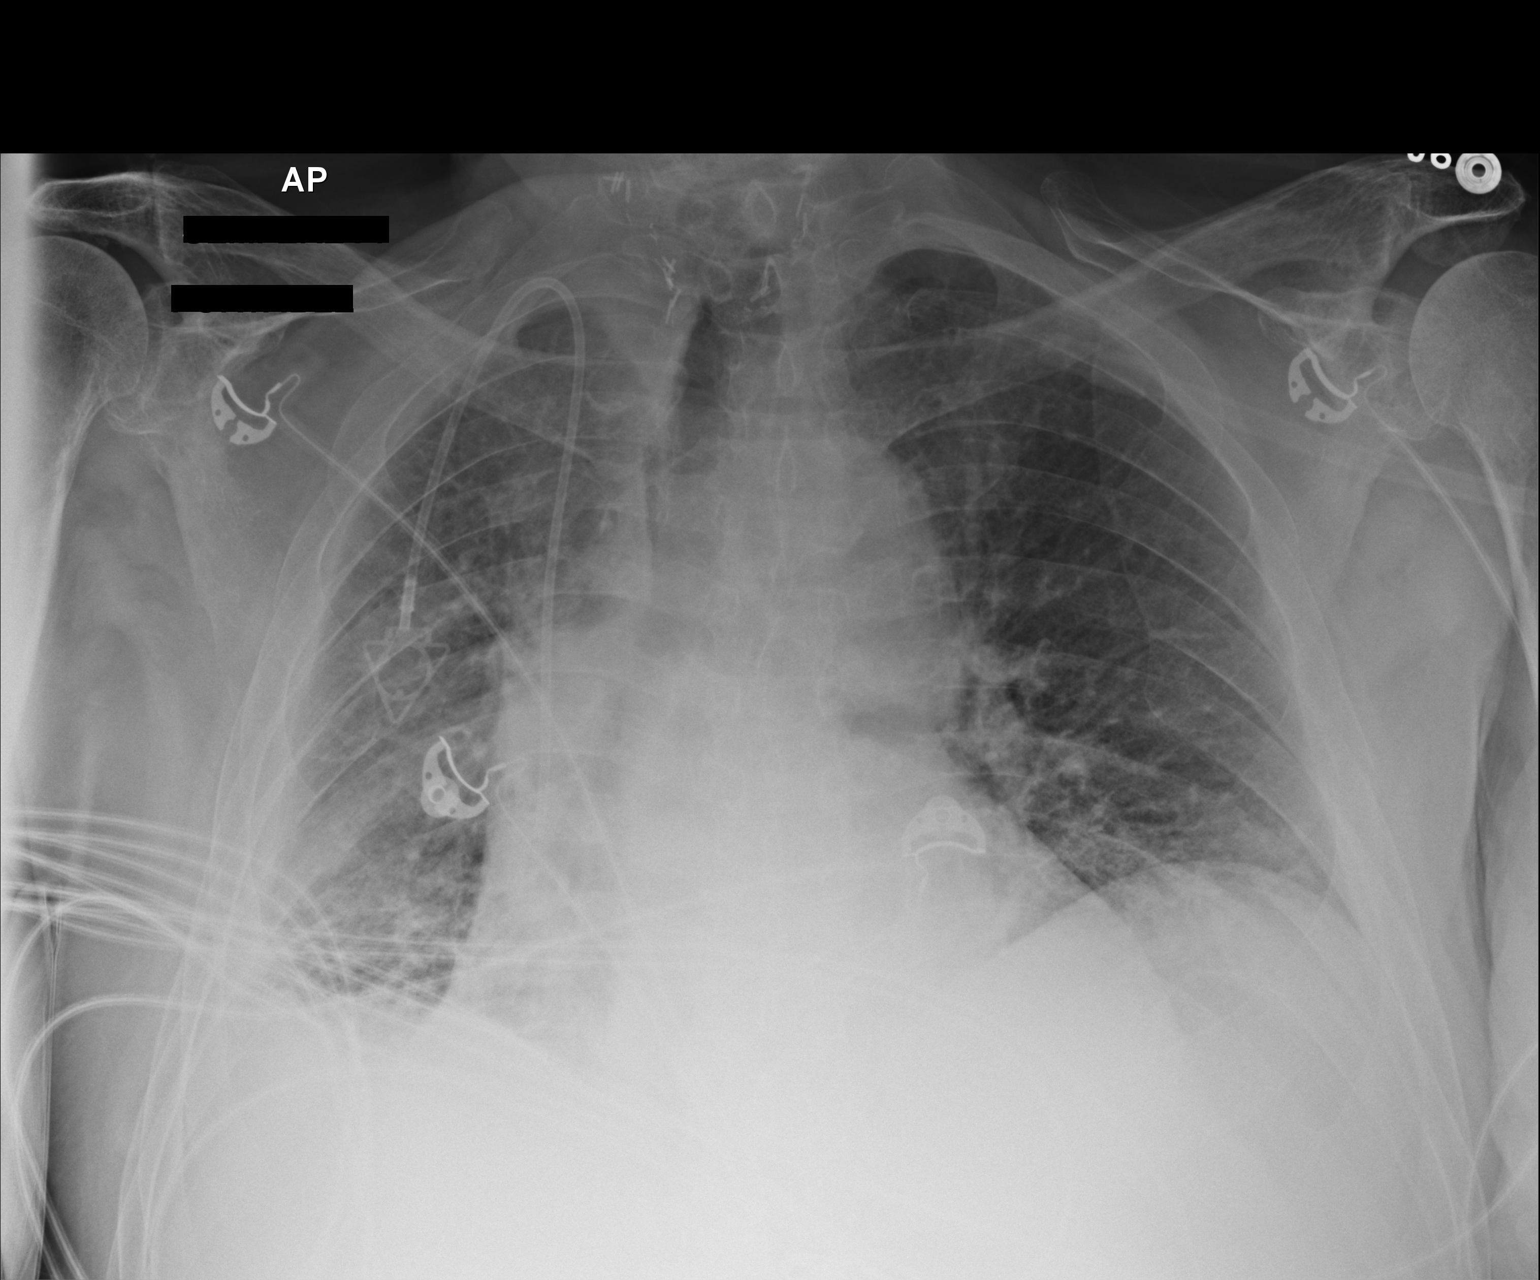

[1 of 1 positions shown; findings below may reference images not displayed]

CT ANGIO CHEST W/CM &/OR WO/CM dated 02/10/2013; DG RIBS
BILATERAL W/CHEST dated 02/06/2013
FINDINGS: 4717 hr. Right IJ power port tip appears unchanged at the SVC right
atrial level. There is stable volume loss in the right hemithorax
with extensive pleural nodular thickening. Underlying interstitial
prominence in both lungs has mildly increased. There is no confluent
airspace opacity or significant pleural effusion. Surgical clips are
noted at the thoracic inlet.
IMPRESSION: Similar overall appearance of the chest compared with available
prior studies. There is mildly increased interstitial prominence in
both lungs which could be related to lower lung volumes or mild
edema. Nodular pleural thickening on the right remains corresponding
with known mesothelioma.

## 2015-03-15 IMAGING — CT CT ABD-PELV W/ CM
2 of 5 series · 16 of 46 positions shown, 18 images · IV contrast (CONTRAST)
Comparison: PET-CT 03/03/2013

CLINICAL DATA: Hypertension. Constipation. Extremity weakness.
Mesothelioma.

EXAM:
CT ABDOMEN AND PELVIS WITH CONTRAST
TECHNIQUE: Multidetector CT imaging of the abdomen and pelvis was performed
using the standard protocol following bolus administration of
intravenous contrast.
CONTRAST:  100mL OMNIPAQUE IOHEXOL 300 MG/ML  SOLN

[Series 3: routine · axial · 0.82mm/px · z∈[+44,+479]mm · 13 of 99 slices shown, 15 images]
[im 6/99  soft-tissue]
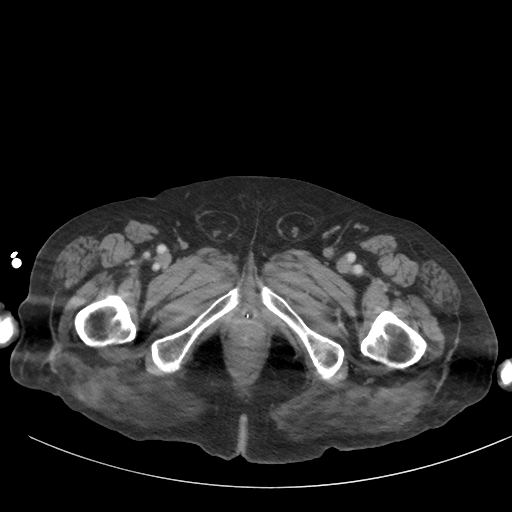
[im 6/99  bone]
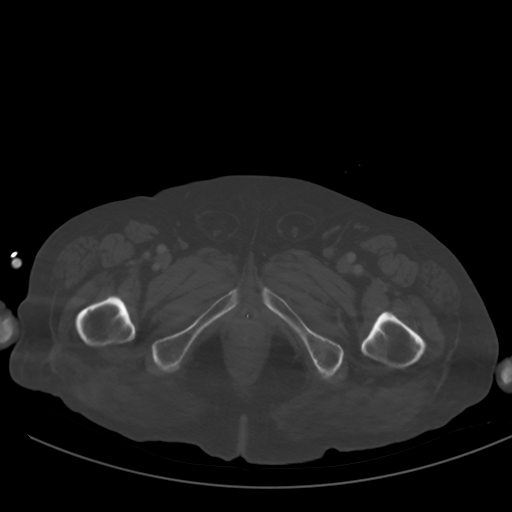
[im 11/99  soft-tissue]
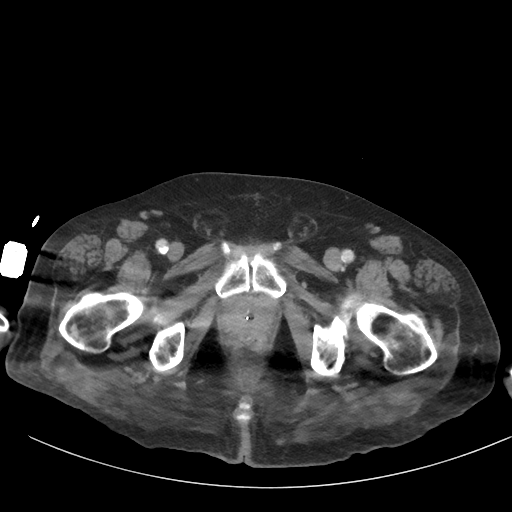
[im 22/99  soft-tissue]
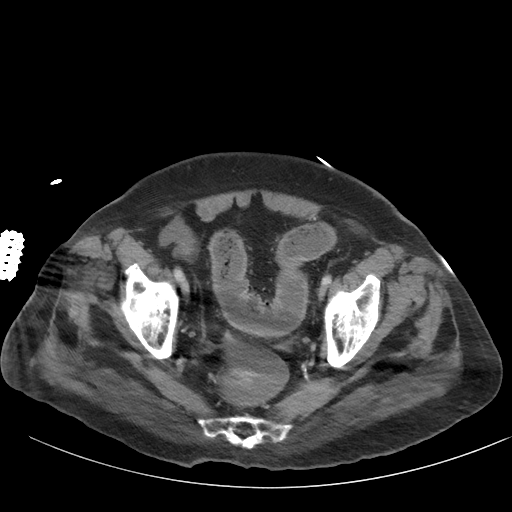
[im 28/99  soft-tissue]
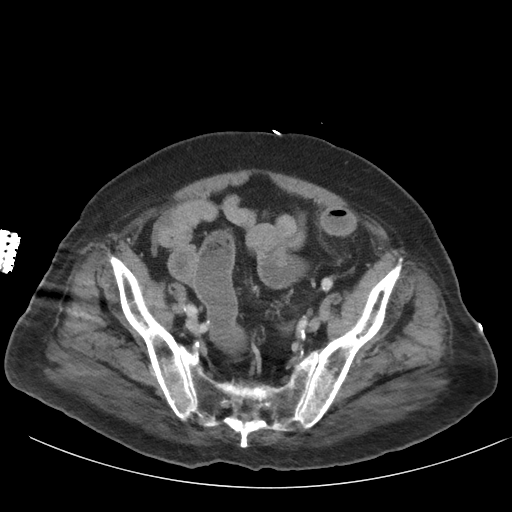
[im 33/99  soft-tissue]
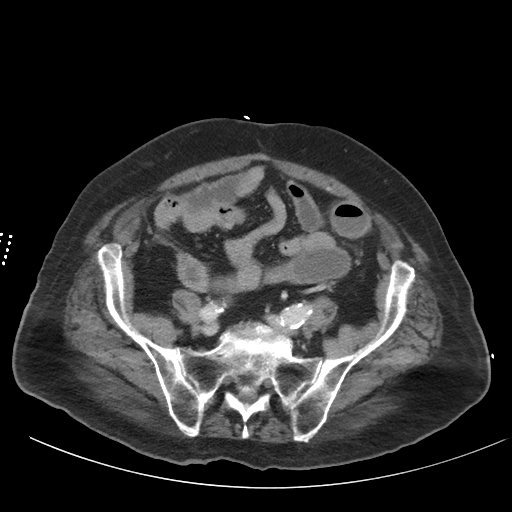
[im 44/99  soft-tissue]
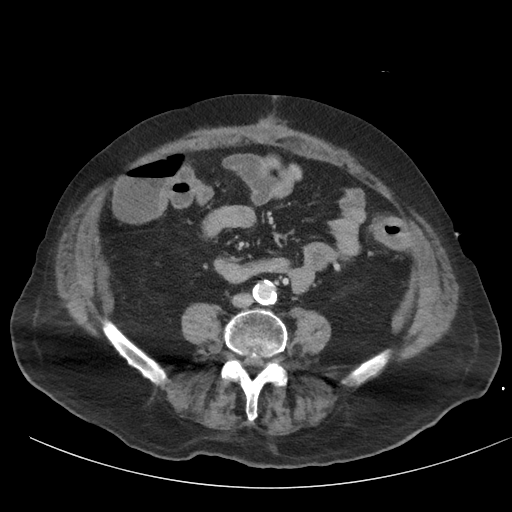
[im 50/99  soft-tissue]
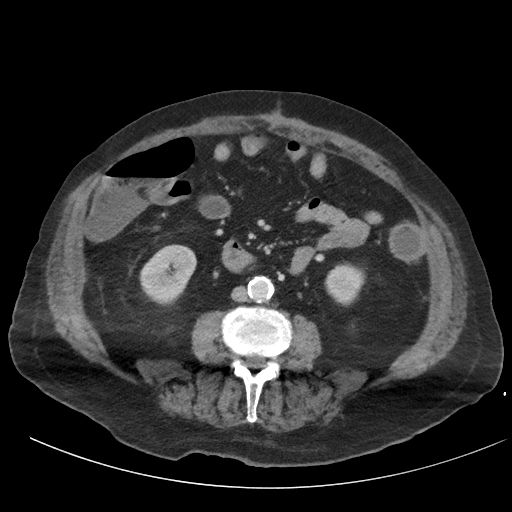
[im 55/99  soft-tissue]
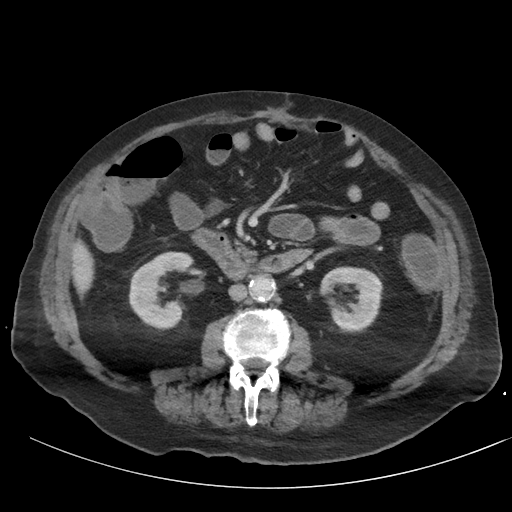
[im 66/99  soft-tissue]
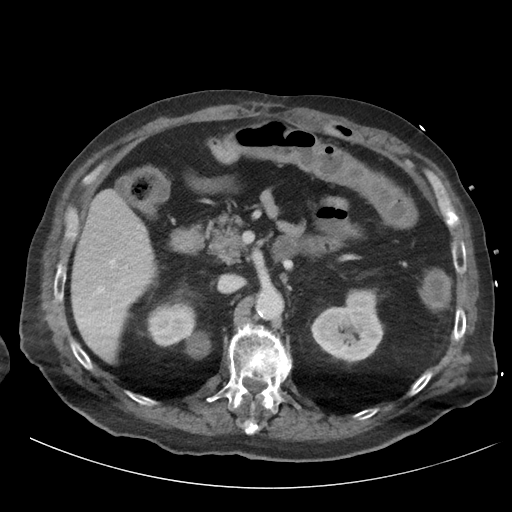
[im 66/99  bone]
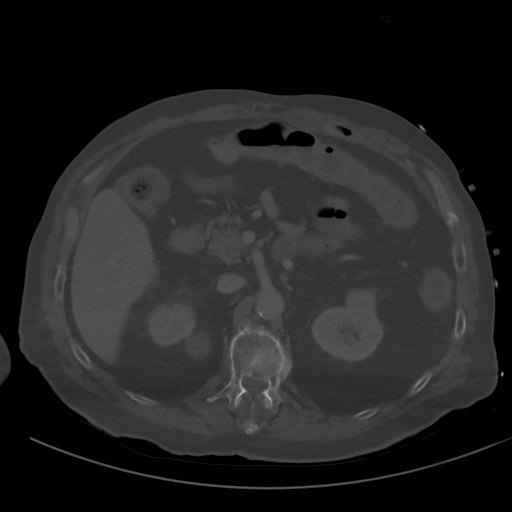
[im 71/99  soft-tissue]
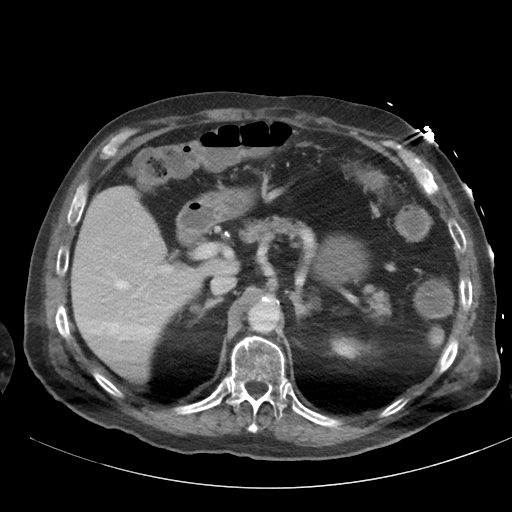
[im 77/99  soft-tissue]
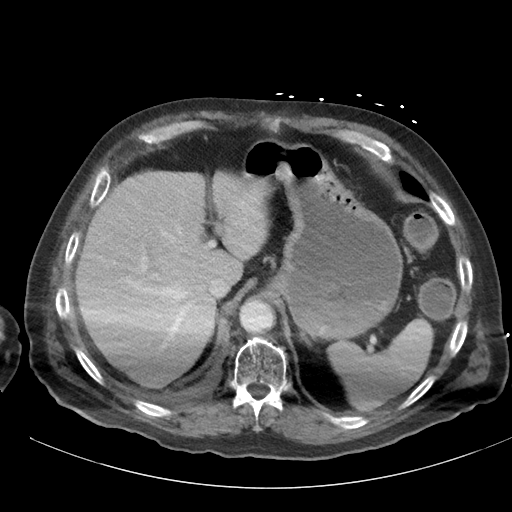
[im 88/99  soft-tissue]
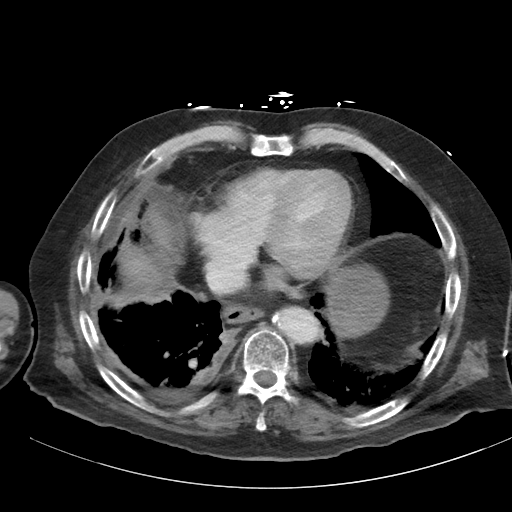
[im 93/99  soft-tissue]
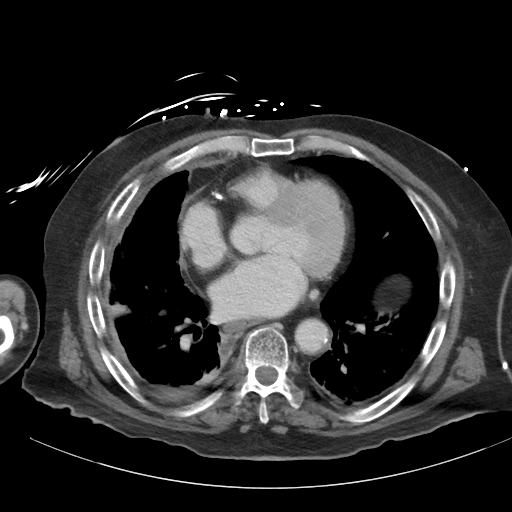

[mpr, coronals, coronal · coronal · 0.96mm/px · 3 of 107 slices shown]
[im 36/107  soft-tissue]
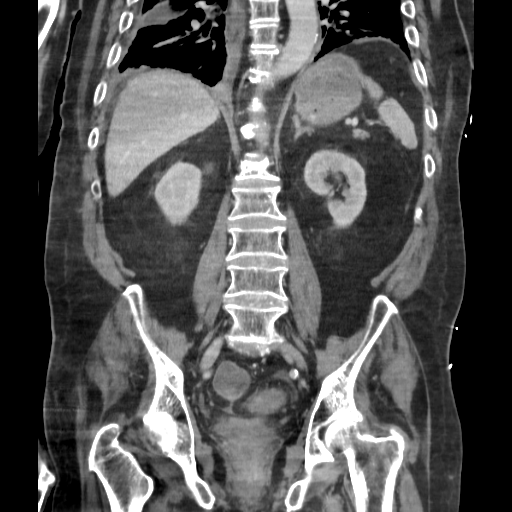
[im 48/107  soft-tissue]
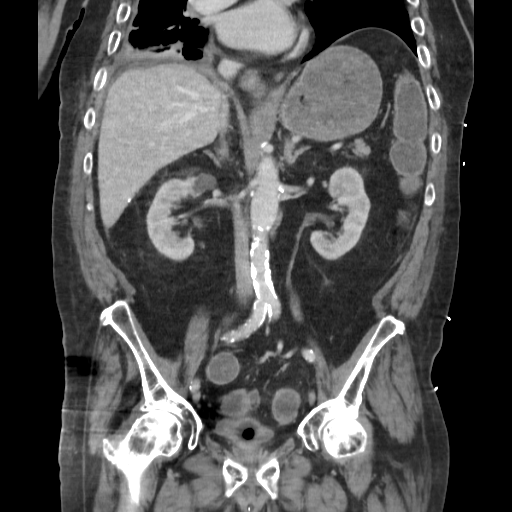
[im 59/107  soft-tissue]
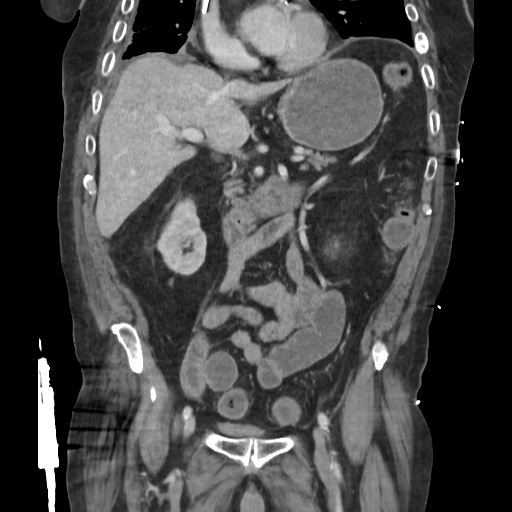

[16 of 46 positions shown; findings below may reference images not displayed]

FINDINGS: Irregular pleural thickening is partially visualized in the right
lung base, similar to the prior PET-CT. Loculated fluid in the right
major fissure has increased, incompletely visualized. Mild dependent
atelectasis is present in the left lower lobe. Three-vessel coronary
artery calcifications are present.

Calcification in the inferior right hepatic lobe is unchanged. The
gallbladder is surgically absent. 2.4 cm soft tissue lesion adjacent
to the upper pole of the right kidney is unchanged. 2.0 cm left
upper pole renal cyst anteriorly is unchanged. The left kidney and
adrenal glands are unremarkable. Small calcification in the
pancreatic head is unchanged.

There is very mild mesenteric stranding adjacent to the distal
descending colon in the left lower quadrant. Air-fluid levels are
present in the colon, which is nondilated. No gross bowel wall
thickening is identified. The appendix is identified in the right
lower quadrant and is unremarkable. The small bowel is nondilated.

A Foley catheter is present in the bladder. No free fluid or
enlarged lymph nodes are identified. Advanced atherosclerotic
calcification is noted of the abdominal aorta and iliac arteries.
Multiple healing, nondisplaced left-sided rib fractures are
partially visualized as described on recent PET-CT. Mild T12 and
moderate L1 compression fractures are unchanged.
IMPRESSION: 1. Fluid-filled colon with very mild inflammatory stranding around
the distal descending colon. No definite bowel wall thickening is
seen, however mild colitis is not excluded.
2. Irregular pleural thickening in the right lung base, consistent
with known midtibial EOMI. Fluid in the right major fissure has
increased from the prior PET-CT.
3. Unchanged soft tissue lesion adjacent to the upper pole of the
right kidney.

## 2016-04-19 ENCOUNTER — Ambulatory Visit
Admission: RE | Admit: 2016-04-19 | Discharge: 2016-04-19 | Disposition: A | Discharge status: Home / Self Care | Payer: Medicare Other | Source: Ambulatory Visit | Attending: Radiation Oncology | Admitting: Radiation Oncology

## 2016-04-19 NOTE — Progress Notes (Signed)
Please see the Nurse Progress Note in the MD Initial Consult Encounter for this patient.
